# Patient Record
Sex: Female | Born: 1940 | Race: White | Hispanic: No | Smoking: Former smoker
Health system: Southern US, Community
[De-identification: ages and names within clinical notes are randomized; demographics above are authoritative.]

## PROBLEM LIST (undated history)

## (undated) DIAGNOSIS — R0600 Dyspnea, unspecified: Secondary | ICD-10-CM

## (undated) DIAGNOSIS — G4700 Insomnia, unspecified: Secondary | ICD-10-CM

## (undated) DIAGNOSIS — E119 Type 2 diabetes mellitus without complications: Secondary | ICD-10-CM

## (undated) DIAGNOSIS — M5106 Intervertebral disc disorders with myelopathy, lumbar region: Secondary | ICD-10-CM

## (undated) DIAGNOSIS — E7800 Pure hypercholesterolemia, unspecified: Secondary | ICD-10-CM

## (undated) DIAGNOSIS — T8859XA Other complications of anesthesia, initial encounter: Secondary | ICD-10-CM

## (undated) DIAGNOSIS — H532 Diplopia: Secondary | ICD-10-CM

## (undated) DIAGNOSIS — M545 Low back pain, unspecified: Secondary | ICD-10-CM

## (undated) DIAGNOSIS — I509 Heart failure, unspecified: Secondary | ICD-10-CM

## (undated) DIAGNOSIS — R05 Cough: Secondary | ICD-10-CM

## (undated) DIAGNOSIS — T4145XA Adverse effect of unspecified anesthetic, initial encounter: Secondary | ICD-10-CM

## (undated) DIAGNOSIS — F419 Anxiety disorder, unspecified: Secondary | ICD-10-CM

## (undated) DIAGNOSIS — H9190 Unspecified hearing loss, unspecified ear: Secondary | ICD-10-CM

## (undated) DIAGNOSIS — Z9289 Personal history of other medical treatment: Secondary | ICD-10-CM

## (undated) DIAGNOSIS — I10 Essential (primary) hypertension: Secondary | ICD-10-CM

## (undated) DIAGNOSIS — K625 Hemorrhage of anus and rectum: Secondary | ICD-10-CM

## (undated) DIAGNOSIS — R002 Palpitations: Secondary | ICD-10-CM

## (undated) DIAGNOSIS — R1310 Dysphagia, unspecified: Secondary | ICD-10-CM

## (undated) DIAGNOSIS — R61 Generalized hyperhidrosis: Secondary | ICD-10-CM

## (undated) DIAGNOSIS — Z9889 Other specified postprocedural states: Secondary | ICD-10-CM

## (undated) DIAGNOSIS — H409 Unspecified glaucoma: Secondary | ICD-10-CM

## (undated) DIAGNOSIS — G894 Chronic pain syndrome: Secondary | ICD-10-CM

## (undated) DIAGNOSIS — M4716 Other spondylosis with myelopathy, lumbar region: Secondary | ICD-10-CM

## (undated) DIAGNOSIS — Q638 Other specified congenital malformations of kidney: Secondary | ICD-10-CM

## (undated) DIAGNOSIS — H547 Unspecified visual loss: Secondary | ICD-10-CM

## (undated) DIAGNOSIS — Z201 Contact with and (suspected) exposure to tuberculosis: Secondary | ICD-10-CM

## (undated) DIAGNOSIS — H538 Other visual disturbances: Secondary | ICD-10-CM

## (undated) DIAGNOSIS — Z5189 Encounter for other specified aftercare: Secondary | ICD-10-CM

## (undated) DIAGNOSIS — R490 Dysphonia: Secondary | ICD-10-CM

## (undated) DIAGNOSIS — R531 Weakness: Secondary | ICD-10-CM

## (undated) DIAGNOSIS — I219 Acute myocardial infarction, unspecified: Secondary | ICD-10-CM

## (undated) DIAGNOSIS — K219 Gastro-esophageal reflux disease without esophagitis: Secondary | ICD-10-CM

## (undated) DIAGNOSIS — R079 Chest pain, unspecified: Secondary | ICD-10-CM

## (undated) DIAGNOSIS — H539 Unspecified visual disturbance: Secondary | ICD-10-CM

## (undated) DIAGNOSIS — M7989 Other specified soft tissue disorders: Secondary | ICD-10-CM

## (undated) DIAGNOSIS — F32A Depression, unspecified: Secondary | ICD-10-CM

## (undated) DIAGNOSIS — R51 Headache: Secondary | ICD-10-CM

## (undated) DIAGNOSIS — I38 Endocarditis, valve unspecified: Secondary | ICD-10-CM

## (undated) DIAGNOSIS — N289 Disorder of kidney and ureter, unspecified: Secondary | ICD-10-CM

## (undated) DIAGNOSIS — I499 Cardiac arrhythmia, unspecified: Secondary | ICD-10-CM

## (undated) DIAGNOSIS — H9319 Tinnitus, unspecified ear: Secondary | ICD-10-CM

## (undated) DIAGNOSIS — K5790 Diverticulosis of intestine, part unspecified, without perforation or abscess without bleeding: Secondary | ICD-10-CM

## (undated) DIAGNOSIS — R230 Cyanosis: Secondary | ICD-10-CM

## (undated) DIAGNOSIS — R112 Nausea with vomiting, unspecified: Secondary | ICD-10-CM

## (undated) DIAGNOSIS — I2510 Atherosclerotic heart disease of native coronary artery without angina pectoris: Secondary | ICD-10-CM

## (undated) DIAGNOSIS — R42 Dizziness and giddiness: Secondary | ICD-10-CM

## (undated) DIAGNOSIS — R55 Syncope and collapse: Secondary | ICD-10-CM

## (undated) DIAGNOSIS — J45909 Unspecified asthma, uncomplicated: Secondary | ICD-10-CM

## (undated) DIAGNOSIS — K649 Unspecified hemorrhoids: Secondary | ICD-10-CM

## (undated) DIAGNOSIS — R062 Wheezing: Secondary | ICD-10-CM

## (undated) DIAGNOSIS — J329 Chronic sinusitis, unspecified: Secondary | ICD-10-CM

## (undated) DIAGNOSIS — R0981 Nasal congestion: Secondary | ICD-10-CM

## (undated) DIAGNOSIS — K589 Irritable bowel syndrome without diarrhea: Secondary | ICD-10-CM

## (undated) DIAGNOSIS — R011 Cardiac murmur, unspecified: Secondary | ICD-10-CM

## (undated) HISTORY — DX: Disorder of kidney and ureter, unspecified: N28.9

## (undated) HISTORY — DX: Other specified soft tissue disorders: M79.89

## (undated) HISTORY — DX: Unspecified glaucoma: H40.9

## (undated) HISTORY — DX: Weakness: R53.1

## (undated) HISTORY — DX: Gastro-esophageal reflux disease without esophagitis: K21.9

## (undated) HISTORY — DX: Other spondylosis with myelopathy, lumbar region: M47.16

## (undated) HISTORY — DX: Unspecified hemorrhoids: K64.9

## (undated) HISTORY — DX: Nasal congestion: R09.81

## (undated) HISTORY — DX: Chronic pain syndrome: G89.4

## (undated) HISTORY — DX: Syncope and collapse: R55

## (undated) HISTORY — PX: BACK SURGERY: SHX140

## (undated) HISTORY — DX: Unspecified hearing loss, unspecified ear: H91.90

## (undated) HISTORY — DX: Intervertebral disc disorders with myelopathy, lumbar region: M51.06

## (undated) HISTORY — DX: Dysphonia: R49.0

## (undated) HISTORY — PX: EYE SURGERY: SHX253

## (undated) HISTORY — DX: Encounter for other specified aftercare: Z51.89

## (undated) HISTORY — DX: Atherosclerotic heart disease of native coronary artery without angina pectoris: I25.10

## (undated) HISTORY — DX: Low back pain: M54.5

## (undated) HISTORY — DX: Generalized hyperhidrosis: R61

## (undated) HISTORY — PX: ABDOMINAL HYSTERECTOMY: SHX81

## (undated) HISTORY — DX: Tinnitus, unspecified ear: H93.19

## (undated) HISTORY — DX: Dyspnea, unspecified: R06.00

## (undated) HISTORY — DX: Low back pain, unspecified: M54.50

## (undated) HISTORY — DX: Cough: R05

## (undated) HISTORY — DX: Chest pain, unspecified: R07.9

## (undated) HISTORY — DX: Hemorrhage of anus and rectum: K62.5

## (undated) HISTORY — DX: Headache: R51

## (undated) HISTORY — PX: LAMINOTOMY: SHX998

## (undated) HISTORY — DX: Diplopia: H53.2

## (undated) HISTORY — DX: Cardiac murmur, unspecified: R01.1

## (undated) HISTORY — DX: Cyanosis: R23.0

## (undated) HISTORY — PX: CERVICAL SPINE SURGERY: SHX589

## (undated) HISTORY — DX: Dysphagia, unspecified: R13.10

## (undated) HISTORY — DX: Dizziness and giddiness: R42

## (undated) HISTORY — DX: Palpitations: R00.2

## (undated) HISTORY — DX: Diverticulosis of intestine, part unspecified, without perforation or abscess without bleeding: K57.90

## (undated) HISTORY — DX: Heart failure, unspecified: I50.9

## (undated) HISTORY — DX: Other visual disturbances: H53.8

## (undated) HISTORY — DX: Unspecified asthma, uncomplicated: J45.909

## (undated) HISTORY — DX: Contact with and (suspected) exposure to tuberculosis: Z20.1

## (undated) HISTORY — DX: Cardiac arrhythmia, unspecified: I49.9

## (undated) HISTORY — DX: Acute myocardial infarction, unspecified: I21.9

## (undated) HISTORY — DX: Other specified congenital malformations of kidney: Q63.8

## (undated) HISTORY — DX: Irritable bowel syndrome without diarrhea: K58.9

## (undated) HISTORY — PX: FIXATION KYPHOPLASTY: SHX860

## (undated) HISTORY — DX: Endocarditis, valve unspecified: I38

## (undated) HISTORY — PX: SPINAL FUSION: SHX223

## (undated) HISTORY — PX: BONE MARROW ASPIRATION: SHX1252

## (undated) HISTORY — DX: Unspecified visual disturbance: H53.9

## (undated) HISTORY — DX: Unspecified visual loss: H54.7

## (undated) HISTORY — DX: Wheezing: R06.2

## (undated) HISTORY — DX: Type 2 diabetes mellitus without complications: E11.9

---
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
## 1997-05-18 ENCOUNTER — Other Ambulatory Visit: Admission: RE | Admit: 1997-05-18 | Discharge: 1997-05-18 | Payer: Self-pay | Admitting: Gynecology

## 1997-10-18 ENCOUNTER — Ambulatory Visit (HOSPITAL_COMMUNITY): Admission: RE | Admit: 1997-10-18 | Discharge: 1997-10-18 | Payer: Self-pay | Admitting: Gastroenterology

## 1998-03-16 ENCOUNTER — Ambulatory Visit (HOSPITAL_COMMUNITY): Admission: RE | Admit: 1998-03-16 | Discharge: 1998-03-16 | Payer: Self-pay

## 1998-05-31 ENCOUNTER — Encounter: Payer: Self-pay | Admitting: *Deleted

## 1998-06-02 ENCOUNTER — Ambulatory Visit (HOSPITAL_COMMUNITY): Admission: RE | Admit: 1998-06-02 | Discharge: 1998-06-02 | Payer: Self-pay | Admitting: *Deleted

## 1998-07-11 ENCOUNTER — Inpatient Hospital Stay (HOSPITAL_COMMUNITY): Admission: EM | Admit: 1998-07-11 | Discharge: 1998-07-12 | Payer: Self-pay | Admitting: Emergency Medicine

## 1998-11-02 ENCOUNTER — Other Ambulatory Visit: Admission: RE | Admit: 1998-11-02 | Discharge: 1998-11-02 | Payer: Self-pay | Admitting: Gynecology

## 1998-11-08 ENCOUNTER — Encounter: Payer: Self-pay | Admitting: *Deleted

## 1998-11-10 ENCOUNTER — Ambulatory Visit (HOSPITAL_COMMUNITY): Admission: RE | Admit: 1998-11-10 | Discharge: 1998-11-10 | Payer: Self-pay | Admitting: *Deleted

## 1999-01-16 ENCOUNTER — Encounter: Payer: Self-pay | Admitting: *Deleted

## 1999-01-16 ENCOUNTER — Encounter: Admission: RE | Admit: 1999-01-16 | Discharge: 1999-01-16 | Payer: Self-pay | Admitting: *Deleted

## 1999-01-26 ENCOUNTER — Ambulatory Visit (HOSPITAL_COMMUNITY): Admission: RE | Admit: 1999-01-26 | Discharge: 1999-01-26 | Payer: Self-pay | Admitting: *Deleted

## 1999-01-26 ENCOUNTER — Encounter (INDEPENDENT_AMBULATORY_CARE_PROVIDER_SITE_OTHER): Payer: Self-pay | Admitting: Specialist

## 1999-03-30 ENCOUNTER — Ambulatory Visit (HOSPITAL_COMMUNITY): Admission: RE | Admit: 1999-03-30 | Discharge: 1999-03-30 | Payer: Self-pay | Admitting: *Deleted

## 1999-07-02 ENCOUNTER — Ambulatory Visit (HOSPITAL_COMMUNITY): Admission: RE | Admit: 1999-07-02 | Discharge: 1999-07-02 | Payer: Self-pay | Admitting: *Deleted

## 1999-08-31 ENCOUNTER — Ambulatory Visit (HOSPITAL_COMMUNITY): Admission: RE | Admit: 1999-08-31 | Discharge: 1999-08-31 | Payer: Self-pay | Admitting: Gastroenterology

## 1999-08-31 ENCOUNTER — Encounter: Payer: Self-pay | Admitting: Gastroenterology

## 1999-12-14 ENCOUNTER — Other Ambulatory Visit: Admission: RE | Admit: 1999-12-14 | Discharge: 1999-12-14 | Payer: Self-pay | Admitting: Gynecology

## 1999-12-18 ENCOUNTER — Encounter: Payer: Self-pay | Admitting: Gynecology

## 1999-12-18 ENCOUNTER — Ambulatory Visit (HOSPITAL_COMMUNITY): Admission: RE | Admit: 1999-12-18 | Discharge: 1999-12-18 | Payer: Self-pay | Admitting: Gynecology

## 2000-11-10 ENCOUNTER — Ambulatory Visit (HOSPITAL_COMMUNITY): Admission: RE | Admit: 2000-11-10 | Discharge: 2000-11-10 | Payer: Self-pay | Admitting: Family Medicine

## 2000-11-10 ENCOUNTER — Encounter: Payer: Self-pay | Admitting: Family Medicine

## 2000-11-12 ENCOUNTER — Encounter: Payer: Self-pay | Admitting: Gynecology

## 2000-11-12 ENCOUNTER — Ambulatory Visit (HOSPITAL_COMMUNITY): Admission: RE | Admit: 2000-11-12 | Discharge: 2000-11-12 | Payer: Self-pay | Admitting: Gynecology

## 2001-01-14 ENCOUNTER — Other Ambulatory Visit: Admission: RE | Admit: 2001-01-14 | Discharge: 2001-01-14 | Payer: Self-pay | Admitting: Dermatology

## 2001-08-18 ENCOUNTER — Encounter (INDEPENDENT_AMBULATORY_CARE_PROVIDER_SITE_OTHER): Payer: Self-pay | Admitting: *Deleted

## 2001-08-18 ENCOUNTER — Ambulatory Visit (HOSPITAL_COMMUNITY): Admission: RE | Admit: 2001-08-18 | Discharge: 2001-08-18 | Payer: Self-pay | Admitting: Gastroenterology

## 2001-12-18 ENCOUNTER — Encounter: Payer: Self-pay | Admitting: Gynecology

## 2001-12-18 ENCOUNTER — Ambulatory Visit (HOSPITAL_COMMUNITY): Admission: RE | Admit: 2001-12-18 | Discharge: 2001-12-18 | Payer: Self-pay | Admitting: Gynecology

## 2002-04-02 ENCOUNTER — Other Ambulatory Visit: Admission: RE | Admit: 2002-04-02 | Discharge: 2002-04-02 | Payer: Self-pay | Admitting: Gynecology

## 2002-04-26 ENCOUNTER — Encounter: Payer: Self-pay | Admitting: Internal Medicine

## 2002-04-26 ENCOUNTER — Ambulatory Visit (HOSPITAL_COMMUNITY): Admission: RE | Admit: 2002-04-26 | Discharge: 2002-04-26 | Payer: Self-pay | Admitting: Internal Medicine

## 2003-01-12 ENCOUNTER — Ambulatory Visit (HOSPITAL_COMMUNITY): Admission: RE | Admit: 2003-01-12 | Discharge: 2003-01-12 | Payer: Self-pay | Admitting: Gynecology

## 2003-06-17 ENCOUNTER — Encounter: Admission: RE | Admit: 2003-06-17 | Discharge: 2003-06-17 | Payer: Self-pay | Admitting: Gastroenterology

## 2003-09-16 ENCOUNTER — Emergency Department (HOSPITAL_COMMUNITY): Admission: EM | Admit: 2003-09-16 | Discharge: 2003-09-16 | Payer: Self-pay

## 2004-09-19 ENCOUNTER — Other Ambulatory Visit: Admission: RE | Admit: 2004-09-19 | Discharge: 2004-09-19 | Payer: Self-pay | Admitting: Gynecology

## 2005-02-05 ENCOUNTER — Ambulatory Visit (HOSPITAL_COMMUNITY): Admission: RE | Admit: 2005-02-05 | Discharge: 2005-02-05 | Payer: Self-pay | Admitting: Gastroenterology

## 2005-07-30 ENCOUNTER — Ambulatory Visit (HOSPITAL_COMMUNITY): Admission: RE | Admit: 2005-07-30 | Discharge: 2005-07-30 | Payer: Self-pay | Admitting: Cardiovascular Disease

## 2005-10-15 ENCOUNTER — Encounter: Admission: RE | Admit: 2005-10-15 | Discharge: 2005-10-15 | Payer: Self-pay | Admitting: Orthopaedic Surgery

## 2005-11-11 ENCOUNTER — Encounter: Admission: RE | Admit: 2005-11-11 | Discharge: 2005-11-11 | Payer: Self-pay | Admitting: Orthopaedic Surgery

## 2005-12-19 ENCOUNTER — Other Ambulatory Visit: Admission: RE | Admit: 2005-12-19 | Discharge: 2005-12-19 | Payer: Self-pay | Admitting: Gynecology

## 2006-05-07 ENCOUNTER — Encounter: Admission: RE | Admit: 2006-05-07 | Discharge: 2006-05-07 | Payer: Self-pay | Admitting: Orthopaedic Surgery

## 2006-06-04 ENCOUNTER — Encounter: Admission: RE | Admit: 2006-06-04 | Discharge: 2006-06-30 | Payer: Self-pay | Admitting: Orthopaedic Surgery

## 2006-08-20 ENCOUNTER — Encounter: Admission: RE | Admit: 2006-08-20 | Discharge: 2006-08-20 | Payer: Self-pay | Admitting: Orthopaedic Surgery

## 2006-10-24 ENCOUNTER — Inpatient Hospital Stay (HOSPITAL_COMMUNITY): Admission: RE | Admit: 2006-10-24 | Discharge: 2006-10-27 | Payer: Self-pay | Admitting: Orthopaedic Surgery

## 2007-01-28 ENCOUNTER — Encounter: Admission: RE | Admit: 2007-01-28 | Discharge: 2007-02-25 | Payer: Self-pay | Admitting: Orthopaedic Surgery

## 2007-02-11 ENCOUNTER — Other Ambulatory Visit: Admission: RE | Admit: 2007-02-11 | Discharge: 2007-02-11 | Payer: Self-pay | Admitting: Gynecology

## 2007-02-12 ENCOUNTER — Encounter: Admission: RE | Admit: 2007-02-12 | Discharge: 2007-02-12 | Payer: Self-pay | Admitting: Gynecology

## 2007-02-26 ENCOUNTER — Encounter: Admission: RE | Admit: 2007-02-26 | Discharge: 2007-02-26 | Payer: Self-pay | Admitting: Orthopaedic Surgery

## 2007-04-04 ENCOUNTER — Encounter: Admission: RE | Admit: 2007-04-04 | Discharge: 2007-04-04 | Payer: Self-pay | Admitting: Orthopaedic Surgery

## 2007-11-20 ENCOUNTER — Emergency Department (HOSPITAL_COMMUNITY): Admission: EM | Admit: 2007-11-20 | Discharge: 2007-11-20 | Payer: Self-pay | Admitting: Emergency Medicine

## 2008-01-12 DIAGNOSIS — Q638 Other specified congenital malformations of kidney: Secondary | ICD-10-CM

## 2008-01-12 HISTORY — DX: Other specified congenital malformations of kidney: Q63.8

## 2008-08-22 ENCOUNTER — Ambulatory Visit (HOSPITAL_COMMUNITY): Admission: RE | Admit: 2008-08-22 | Discharge: 2008-08-22 | Payer: Self-pay | Admitting: Gynecology

## 2008-09-30 ENCOUNTER — Encounter: Admission: RE | Admit: 2008-09-30 | Discharge: 2008-09-30 | Payer: Self-pay | Admitting: Orthopaedic Surgery

## 2008-12-31 ENCOUNTER — Encounter: Admission: RE | Admit: 2008-12-31 | Discharge: 2008-12-31 | Payer: Self-pay | Admitting: Orthopaedic Surgery

## 2009-04-21 ENCOUNTER — Encounter: Admission: RE | Admit: 2009-04-21 | Discharge: 2009-04-21 | Payer: Self-pay | Admitting: Gastroenterology

## 2009-05-04 ENCOUNTER — Emergency Department (HOSPITAL_COMMUNITY): Admission: EM | Admit: 2009-05-04 | Discharge: 2009-05-04 | Payer: Self-pay | Admitting: Emergency Medicine

## 2009-10-03 ENCOUNTER — Encounter: Admission: RE | Admit: 2009-10-03 | Discharge: 2009-10-03 | Payer: Self-pay | Admitting: Orthopaedic Surgery

## 2009-10-31 ENCOUNTER — Emergency Department (HOSPITAL_COMMUNITY): Admission: EM | Admit: 2009-10-31 | Discharge: 2009-10-31 | Payer: Self-pay | Admitting: Emergency Medicine

## 2010-03-18 ENCOUNTER — Encounter: Payer: Self-pay | Admitting: Orthopaedic Surgery

## 2010-05-20 LAB — CBC
HCT: 35.6 % — ABNORMAL LOW (ref 36.0–46.0)
MCV: 93.5 fL (ref 78.0–100.0)
Platelets: 214 10*3/uL (ref 150–400)
RBC: 3.8 MIL/uL — ABNORMAL LOW (ref 3.87–5.11)
WBC: 7.7 10*3/uL (ref 4.0–10.5)

## 2010-05-20 LAB — COMPREHENSIVE METABOLIC PANEL
AST: 23 U/L (ref 0–37)
Albumin: 3.3 g/dL — ABNORMAL LOW (ref 3.5–5.2)
Alkaline Phosphatase: 65 U/L (ref 39–117)
BUN: 6 mg/dL (ref 6–23)
CO2: 25 mEq/L (ref 19–32)
Chloride: 110 mEq/L (ref 96–112)
GFR calc non Af Amer: >60 mL/min (ref >60)
Potassium: 3.8 mEq/L (ref 3.5–5.1)
Total Bilirubin: 0.7 mg/dL (ref 0.3–1.2)

## 2010-05-20 LAB — URINALYSIS, ROUTINE W REFLEX MICROSCOPIC
Glucose, UA: NEGATIVE mg/dL
Hgb urine dipstick: NEGATIVE
Ketones, ur: NEGATIVE mg/dL
Protein, ur: NEGATIVE mg/dL

## 2010-05-20 LAB — DIFFERENTIAL
Basophils Absolute: 0.1 10*3/uL (ref 0.0–0.1)
Basophils Relative: 1 % (ref 0–1)
Eosinophils Relative: 5 % (ref 0–5)
Monocytes Absolute: 0.9 10*3/uL (ref 0.1–1.0)
Neutro Abs: 4.5 10*3/uL (ref 1.7–7.7)

## 2010-05-20 LAB — PROTIME-INR: Prothrombin Time: 13.7 seconds (ref 11.6–15.2)

## 2010-07-10 NOTE — Op Note (Signed)
NAME:  Kaitlyn Serrano, Kaitlyn Serrano                  ACCOUNT NO.:  i   MEDICAL RECORD NO.:  [id number]          PATIENT TYPE:  INP   LOCATION:  5038                         FACILITY:  [facility]   PHYSICIAN:  Sharolyn Douglas, M.D.        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   PROCEDURE:  1. L4-L5 and L5-S1 laminectomy with decompression of the thecal sac      and nerve roots bilaterally.  2. Posterior spinal fusion L4 through S1  3. Segmental pedicle screw instrumentation L4-S1 using the Abbott      spine system.  4. Transforaminal lumbar interbody fusion at L4-L5 and L5-S1 with      placement of three PEEK cages, two at L4-L5 and one at L5-S1.  5. Local autogenous bone graft supplemented with 15 mL of Grafton      allograft and OP1 BMP.   SURGEON:  Sharolyn Douglas, M.D.   ASSISTANTJill Side Mahar, P.A.-C.   ANESTHESIA:  General endotracheal.   ESTIMATED BLOOD LOSS:  300 mL.   COMPLICATIONS:  None.   COUNTS:  Needle and sponge count correct.   INDICATIONS:  The patient is a pleasant 70 year old female with chronic  progressive back and bilateral lower extremity pain.  Her imaging  studies show degenerative disc disease at L4-L5 and L5-S1.  She has had  a two level positive concordant diskogram at both of these levels with  normal controls above.  She has failed all attempts at other  conservative treatment modalities and now elects to undergo two level  lumbar decompression and fusion from L4 to S1.  The risk, benefits, and  alternatives were reviewed.  The patient elected to proceed.   DESCRIPTION OF PROCEDURE:  After informed consent, she was taken to the  operating room.  She underwent general endotracheal anesthesia without  difficulty and given prophylactic IV antibiotics.  Neural monitoring was  established in the form of lower extremity EMGs and SSEPs. She was  carefully turned prone onto the Wilson frame.  All bony prominences  padded.  Her face and eyes were protected at all times.  Her back was  prepped and draped in the usual sterile fashion.  A midline incision was  made from L4 down to S1.  Midline dissection was carried through the  deep fascia.  Subperiosteal exposure carried out to the tips of the  transverse process of L4, L5, and also the sacral ala bilaterally.  Deep  retractors were placed.  We took an intraoperative x-ray to confirm the  levels.  We then turned our attention to performing a wide laminectomy  by removing the spinous processes and lamina of L4 and L5. The  ligamentum flavum was removed piecemeal.  The thecal sac was  decompressed.  We carried out the laminectomy into the lateral recess  decompressing the L5 and S1 nerve roots.  Once we were satisfied  with  the decompression, we turned our attention to placing pedicle screws.   Using anatomic probing technique, pedicle screws were placed at L4, L5,  and S1 bilaterally.  We utilized 6.5 x 50 mm screws at L4 and L5, 7.5 x  40 mm screws in the sacrum bilaterally.  The screw purchase was good.  Each screw hole was initiated with the awl.  The pedicles were  cannulated. The pedicles were then palpated with a ball tip feeler and  there were no breeches.  After placing pedicle screws, they were  stimulated using triggered EMGs and there were no deleterious changes.   We then turned our attention to completing the posterior spinal  arthrodesis.  The transverse processes of L4 and L5 were decorticated  along with the sacral ala.  We then packed local autogenous bone graft,  which had been obtained from the laminectomy, into the lateral gutters.  This was supplemented with 15 mL of Grafton allograft.   At this point, we turned our attention to performing transforaminal  lumbar interbody fusions at L4-L5 and  L5-S1.  This was done in order to  remove the painful L4-L5 and L5-S1 disc and also to improve the fusion  rate.  On the left side, the remaining set joints at L4-5 and L5-S1 were  osteotomized.  Free running EMGs were monitored. The exiting  transversing nerve roots were identified and protected all times.  Starting at L5-S1, a transforaminal window was created, the disc space  was entered, and a radical discectomy was completed. The cartilaginous  endplates were scraped clean.  The disc space was dilated up to 10 mm.  We then packed the disc space with local bone graft along with OP1 BMP.  We then inserted a 10 mm PEEK cage which had been packed with the OP1  BMP into the interspace, tamped it anteriorly and across the midline.  We then turned our attention to performing the same procedure at L4-L5.  Again, the facet was osteotomized.  The exiting transversing nerve roots  were identified and protected. A transforaminal window was created, the  disc space was entered, and a radical discectomy was completed.  At this  level, we distracted the disc space up to 14 mm.  We packed the disc  space with local autogenous bone graft.  We then inserted a 14-mm PEEK  cage which had been packed with the BMP into the interspace, tamped it  anteriorly, and flipped it longitudinally.  We then placed a second PEEK  cage at this level, again, flipping it longitudinally.  Hemostasis was  achieved.  There were no deleterious changes in neural monitoring  including the free running EMGs and SSEPs throughout the procedure.  We  placed 70 mm titanium rods into the polyaxial screw heads.  Compression  was applied across each segment before shearing off the locking caps.  A  cross connector was placed.   A Hemovac drain was left in place.  The deep fascia was closed with a  running #1 Vicryl suture, the subcutaneous layer closed with 0 Vicryl  and 2-0 Vicryl, followed by a running 3-0 subcuticular Vicryl  suture on  the skin edges.  Dermabond was applied.  A sterile dressing was placed.  The patient was turned supine, extubated without difficulty, and  transferred to recovery in stable condition.   It should be noted my assistant, [employer], P.A.-C., was present  throughout the procedure including positioning and the exposure.  She  assisted  with the exposure using the Cobb elevators and suction.  She  assisted me with loupes and headlight magnification to do the  laminectomy, the decompression, the transforaminal lumbar interbody  fusion.  She also assisted with the arthrodesis and the instrumentation.  She helped me with wound closure.      Sharolyn Douglas, M.D.  Electronically Signed     MC/MEDQ  D:  10/24/2006  T:  10/25/2006  Job:  [id number]

## 2010-07-13 NOTE — Op Note (Signed)
NAMEKIMANI, BEDOYA              ACCOUNT NO.:  [id number]   MEDICAL RECORD NO.:  [id number]          PATIENT TYPE:  AMB   LOCATION:  ENDO                         FACILITY:  [facility]   PHYSICIAN:  Petra Kuba, M.D.    DATE OF BIRTH:  04/18/1940   DATE OF PROCEDURE:  02/05/2005  DATE OF DISCHARGE:                                 OPERATIVE REPORT   PROCEDURE:  EGD with Savary dilatation.   INDICATIONS:  Increased dysphagia. Consent was signed prior to any premeds  given after the risks, benefits, methods, and options were thoroughly  discussed multiple times in the past.   MEDICINES USED ADDITIONALLY:  30 of Demerol, 3 of Versed.   DESCRIPTION OF PROCEDURE:  The video endoscope was inserted by direct  vision. The esophagus was tortuous but no abnormalities were seen. Possibly  she had a tiny hiatal hernia. The scope passed into the stomach and advanced  to a normal antrum, normal pylorus into a normal duodenal bulb around the C-  loop to a normal second portion of the duodenum. A normal-appearing ampulla  was seen. The scope was then slowly withdrawn back to the bulb. A good look  there ruled out abnormalities in that location. The scope was withdrawn back  to the stomach and retroflexed. The  angularis, cardia, fundus, lesser and  greater curve were normal except for possibly some mild atrophic gastritis  and high in the cardia the tiny hiatal hernia probably being confirmed. The  scope was straightened. Straight visualization of stomach did not feel any  additional findings. The scope was then slowly withdrawn back to about 18  cm. Again a good look at the esophagus was normal. The scope was then  advanced to the antrum. The Savary wire was advanced and on one-to-one  removal and advancement the scope was removed making sure to keep the wire  in the proper position. Once the scope was removed, the wire was confirmed  at four black mark which corresponds to 80 cm usually confirming  proper  location. No fluoro was used. We went ahead and proceeded with the Savary 15  and then 16 mm dilators without resistance or heme. After the 15 was  removed, again the wire was at the 4 black mark confirming probable proper  location. Once the 16 was inserted, the wire was withdrawn back into the  dilator both were removed in tandem. The procedure was terminated at that  junction. There was no obvious immediate complication. The patient tolerated  the procedure well.   ENDOSCOPIC DIAGNOSES:  1.  Questionable tiny hiatal hernia.  2.  Minimal atrophic gastritis.  3.  Otherwise essentially normal EGD therapy. Savary dilatation to 16 mm      without fluoro, without heme or resistance.   PLAN:  Continue Zegerid since it seems to be helping. See how the dilation  works, follow-up p.r.n. or in 2-3 months.           ______________________________  Petra Kuba, M.D.     MEM/MEDQ  D:  02/05/2005  T:  02/06/2005  Job:  [id number]  cc:   Ernestina Penna, M.D.  Fax: [phone]

## 2010-07-13 NOTE — Op Note (Signed)
Kaitlyn Serrano, Kaitlyn Serrano              ACCOUNT NO.:  [id number]   MEDICAL RECORD NO.:  [id number]          PATIENT TYPE:  AMB   LOCATION:  ENDO                         FACILITY:  [facility]   PHYSICIAN:  Petra Kuba, M.D.    DATE OF BIRTH:  02/24/1941   DATE OF PROCEDURE:  02/05/2005  DATE OF DISCHARGE:                                 OPERATIVE REPORT   PROCEDURE:  Colonoscopy.   INDICATIONS:  History of colon polyps, some increasing constipation, due for  repeat screening. Consent was signed after risks, benefits, methods, and  options were thoroughly discussed in the office on multiple occasions.   MEDICINES USED:  Demerol 70, Versed 7.   DESCRIPTION OF PROCEDURE:  Rectal inspection is pertinent for external  hemorrhoids, small. Digital exam was negative. The video pediatric  adjustable colonoscope was inserted and despite some significant left-sided  diverticula, easily advanced around the colon to the cecum. This did not  require any abdominal pressure or any position changes. No polypoid lesions  were seen on insertion. The cecum was identified by the appendiceal orifice  and the ileocecal valve. In fact the scope was inserted a short ways into  the terminal ileum which was normal. Photo documentation was obtained. The  scope was slowly withdrawn. The prep was adequate. There was some liquid  stool that required washing and suctioning but on slow withdrawal through  the colon other than the significant left-sided diverticula, no  abnormalities were seen. Once back in the rectum, anorectal pull-through and  retroflexion confirmed some small hemorrhoids. The scope was straightened  and readvanced a short ways up the left side of the colon, air was  suctioned, scope removed. The patient tolerated the procedure well. There  was no obvious immediate complication.   ENDOSCOPIC DIAGNOSES:  1.  Internal and external hemorrhoids.  2.  Left significant diverticula.  3.  Otherwise within  normal limits to the terminal ileum.   PLAN:  Repeat screening in 5 years. Continue workup with an EGD and  dilatation for dysphagia.           ______________________________  Petra Kuba, M.D.     MEM/MEDQ  D:  02/05/2005  T:  02/06/2005  Job:  [id number]   cc:   Ernestina Penna, M.D.  Fax: [phone]

## 2010-07-13 NOTE — Procedures (Signed)
[facility]  Patient:    Kaitlyn Serrano, FADDIS Visit Number: [id number] MRN: [id number]          Service Type: END Location: ENDO Attending Physician:  Nelda Marseille Dictated by:   Petra Kuba, M.D. Proc. Date: 08/18/01 Admit Date:  08/18/2001   CC:         Monica Becton, M.D.   Procedure Report  PROCEDURE:  Colonoscopy with polypectomy.  INDICATIONS FOR PROCEDURE:  Guaiac positivity, family history of colon polyps, personal history of colon polyps.  Consent was signed after risks, benefits, methods, and options were thoroughly discussed in the office.  MEDICINES USED:  Demerol 60, Versed 6.  PROCEDURE:  Rectal inspection was pertinent for external hemorrhoids, small. Digital exam was negative. The video pediatric adjustable colonoscope was inserted and easily advanced around the colon to the cecum. This did require some abdominal pressure but no position changes. On insertion, left sided diverticula were seen. The cecum was identified by the appendiceal orifice and the ileocecal valve, no other abnormalities were seen on insertion, no blood was seen. The scope was inserted a short ways into the terminal ileum which was normal. Photo documentation was obtained, the scope was slowly withdrawn. The prep was adequate. There was some liquid stool that required washing and suctioning but on slow withdrawal through the colon other than the left sided diverticula, two tiny polyps were seen, one in the transverse hot biopsied x2 and one in the sigmoid hot biopsied x1. No other lesions were seen as we withdrew back to the rectum. Once back in the rectum, the scope was retroflexed pertinent for some internal hemorrhoids. The scope was straightened and advanced a short ways in the left side of the colon, air was suctioned, the scope removed. The patient tolerated the procedure well. There was no obvious or immediate complication.  ENDOSCOPIC  DIAGNOSIS: 1. Internal and external hemorrhoids. 2. Left sided diverticula. 3. Two tiny sigmoid and transverse polyps hot biopsied. 4. Otherwise within normal limits to the terminal ileum.  PLAN:  Await pathology but would repeat screening in five years. Happy to see back p.r.n. or in two months to recheck guaiac symptoms, possibly a CBC and decide any other workup and plans. Otherwise return care to Dr. Christell Constant for the customary health care maintenance. Dictated by:   Petra Kuba, M.D. Attending Physician:  Nelda Marseille DD:  08/18/01 TD:  08/19/01 Job: [id number]

## 2010-07-13 NOTE — Discharge Summary (Signed)
Kaitlyn Serrano, HANDLEY NO.:  [id number]   MEDICAL RECORD NO.:  [id number]          PATIENT TYPE:  INP   LOCATION:  5038                         FACILITY:  [facility]   PHYSICIAN:  Sharolyn Douglas, M.D.        DATE OF BIRTH:  [birth year]   DATE OF ADMISSION:  10/24/2006  DATE OF DISCHARGE:  10/27/2006                               DISCHARGE SUMMARY   ADMISSION DIAGNOSES:  1. Degenerative disease lumbar spine with pain.  2. Hypertension.  3. Glaucoma.  4. Gastroesophageal reflux disease.   DISCHARGE DIAGNOSES:  1. Status post L4-5 fusion, doing well.  2. Postoperative blood loss anemia.  3. Degenerative disease lumbar spine with pain.  4. Hypertension.  5. Glaucoma.  6. Gastroesophageal reflux disease.   PROCEDURE:  On October 24, 2006, the patient was taken for a L4-S1  posterior spinal fusion with pedicle screws.  Surgeon was Sharolyn Douglas,  assistant [employer], PAC.  Anesthesia was general.  Consults none.   LABORATORY DATA:  CBC with diff from preop was within normal limits with  the exception of __________ of 0.9 and eosinophils of 8.  CBC  postoperatively x3 days:  Hemoglobin reached a low of 8.5 and hematocrit  25.5 on October 27, 2006.  PT/INR and PTT preop normal.  Complete  metabolic panel preop was normal with the exception of glucose of 109.  Postoperatively, basic metabolic panel was normal with the exception of  slight pelvic glucose of 161 and 126 on postop day #1 and #2.  UA was  negative.  Urine culture shows no growth.  X-rays were used  intraoperatively for localization, also done on September 1 prior to  discharge showed L4-S1 posterior spinal fusion with no complicating  features.  EKG from October 24, 2006, showed normal sinus rhythm, minimal  voltage criteria for LVH.  Cannot rule out anterior infarct.  Read by  Dr. Algie Coffer.   BRIEF HISTORY:  The patient is a 70 year old female with a long history  problems with her back.  Unfortunately, she  failed conservative  treatment.  Pain continued getting worse.  Risks and benefits of surgery  were discussed with the patient at length by Dr. Noel Gerold as well as  myself.  She indicated understanding and opted to proceed with surgery.   HOSPITAL COURSE:  On October 24, 2006, the patient was admitted to the  hospital for the above procedure.  She tolerated procedure well without  any intraoperative complications.  She was transferred to recovery room  in stable condition.  Postoperatively routine orthopedic spine protocol  was followed.   Physical therapy, occupational therapy worked with the patient on a  daily basis.  They worked on brace use, progressive ambulation program  and back precautions.  She progressed along well with them and got to  the point she was independent and safe with all the above prior to  discharge.   The patient did not develop any medical complications through her  postoperative course, and she was ready for discharge by October 27, 2006.   DISCHARGE/PLAN:  Patient is a 70 year old female  status post L4-S1  posterior spinal fusion doing very well.   DISCHARGE INSTRUCTIONS:  1. Activities:  Daily ambulation program.  Brace on when she is up.      Back precautions at all times.  No lifting greater than 5 pounds.      The patient may shower.  Follow-up 2 weeks postop with Dr. Noel Gerold.  2. DIET:  Regular home diet as tolerated.   MEDICATIONS ON DISCHARGE:  1. Vicodin as needed for pain.  2. Robaxin as needed for muscle spasm.  3. Multivitamin daily.  4. Calcium 1200 daily.  5. Colace twice daily as needed.  6. Laxative as needed.  7. No NSAIDs.  8. Resume home medications.   CONDITION ON DISCHARGE:  Stable, improved.   DISPOSITION:  The patient is being discharged to home with her family's  help as well as Home Health physical therapy and occupational therapy.      Verlin Fester, P.A.      Sharolyn Douglas, M.D.  Electronically Signed    CM/MEDQ   D:  11/20/2006  T:  11/20/2006  Job:  [id number]   cc:   Sharolyn Douglas, M.D.

## 2010-12-05 ENCOUNTER — Ambulatory Visit: Payer: Medicare Other | Attending: Orthopaedic Surgery | Admitting: Physical Therapy

## 2010-12-05 DIAGNOSIS — R293 Abnormal posture: Secondary | ICD-10-CM | POA: Insufficient documentation

## 2010-12-05 DIAGNOSIS — M2560 Stiffness of unspecified joint, not elsewhere classified: Secondary | ICD-10-CM | POA: Insufficient documentation

## 2010-12-05 DIAGNOSIS — IMO0001 Reserved for inherently not codable concepts without codable children: Secondary | ICD-10-CM | POA: Insufficient documentation

## 2010-12-05 DIAGNOSIS — M542 Cervicalgia: Secondary | ICD-10-CM | POA: Insufficient documentation

## 2010-12-05 DIAGNOSIS — R5381 Other malaise: Secondary | ICD-10-CM | POA: Insufficient documentation

## 2010-12-06 ENCOUNTER — Ambulatory Visit: Payer: Medicare Other | Admitting: Physical Therapy

## 2010-12-07 LAB — COMPREHENSIVE METABOLIC PANEL
AST: 22
Albumin: 3.9
Chloride: 102
Creatinine, Ser: 0.69
GFR calc Af Amer: >60
Potassium: 3.7
Sodium: 138
Total Bilirubin: 0.6

## 2010-12-07 LAB — BASIC METABOLIC PANEL
BUN: 5 — ABNORMAL LOW
CO2: 31
CO2: 28
Calcium: 8.3 — ABNORMAL LOW
Chloride: 105
Chloride: 104
Chloride: 106
Creatinine, Ser: 0.48
Creatinine, Ser: 0.53
GFR calc Af Amer: >60
GFR calc Af Amer: >60
GFR calc non Af Amer: >60
Glucose, Bld: 96
Potassium: 3.5
Sodium: 138

## 2010-12-07 LAB — URINE CULTURE
Colony Count: NO GROWTH
Culture: NO GROWTH

## 2010-12-07 LAB — CBC
Hemoglobin: 8.8 — ABNORMAL LOW
MCHC: 34.2
MCHC: 34.6
MCV: 91.2
MCV: 90.3
MCV: 90.4
Platelets: 172
Platelets: 215
Platelets: 268
RBC: 2.81 — ABNORMAL LOW
RDW: 13.4
RDW: 13.3
WBC: 13.8 — ABNORMAL HIGH
WBC: 17 — ABNORMAL HIGH
WBC: 6.4

## 2010-12-07 LAB — DIFFERENTIAL
Basophils Absolute: 0
Eosinophils Relative: 8 — ABNORMAL HIGH
Lymphocytes Relative: 20
Lymphs Abs: 1.3
Monocytes Absolute: 0.9 — ABNORMAL HIGH

## 2010-12-07 LAB — URINALYSIS, ROUTINE W REFLEX MICROSCOPIC
Glucose, UA: NEGATIVE
pH: 7.5

## 2010-12-10 ENCOUNTER — Ambulatory Visit: Payer: Medicare Other | Admitting: Physical Therapy

## 2010-12-12 ENCOUNTER — Ambulatory Visit: Payer: Medicare Other | Admitting: Physical Therapy

## 2010-12-17 ENCOUNTER — Encounter: Payer: Medicare Other | Admitting: Physical Therapy

## 2010-12-19 ENCOUNTER — Encounter: Payer: Medicare Other | Admitting: Physical Therapy

## 2011-02-20 ENCOUNTER — Emergency Department (HOSPITAL_COMMUNITY)
Admission: EM | Admit: 2011-02-20 | Discharge: 2011-02-20 | Disposition: A | Discharge status: Home / Self Care | Payer: Medicare Other | Attending: Emergency Medicine | Admitting: Emergency Medicine

## 2011-02-20 ENCOUNTER — Encounter: Payer: Self-pay | Admitting: Emergency Medicine

## 2011-02-20 DIAGNOSIS — R059 Cough, unspecified: Secondary | ICD-10-CM | POA: Insufficient documentation

## 2011-02-20 DIAGNOSIS — R509 Fever, unspecified: Secondary | ICD-10-CM | POA: Insufficient documentation

## 2011-02-20 DIAGNOSIS — I10 Essential (primary) hypertension: Secondary | ICD-10-CM | POA: Insufficient documentation

## 2011-02-20 DIAGNOSIS — R51 Headache: Secondary | ICD-10-CM | POA: Insufficient documentation

## 2011-02-20 DIAGNOSIS — R070 Pain in throat: Secondary | ICD-10-CM | POA: Insufficient documentation

## 2011-02-20 DIAGNOSIS — E789 Disorder of lipoprotein metabolism, unspecified: Secondary | ICD-10-CM | POA: Insufficient documentation

## 2011-02-20 DIAGNOSIS — B9789 Other viral agents as the cause of diseases classified elsewhere: Secondary | ICD-10-CM | POA: Insufficient documentation

## 2011-02-20 DIAGNOSIS — B349 Viral infection, unspecified: Secondary | ICD-10-CM

## 2011-02-20 DIAGNOSIS — R599 Enlarged lymph nodes, unspecified: Secondary | ICD-10-CM | POA: Insufficient documentation

## 2011-02-20 DIAGNOSIS — R05 Cough: Secondary | ICD-10-CM | POA: Insufficient documentation

## 2011-02-20 DIAGNOSIS — Z79899 Other long term (current) drug therapy: Secondary | ICD-10-CM | POA: Insufficient documentation

## 2011-02-20 HISTORY — DX: Pure hypercholesterolemia, unspecified: E78.00

## 2011-02-20 HISTORY — DX: Insomnia, unspecified: G47.00

## 2011-02-20 HISTORY — DX: Essential (primary) hypertension: I10

## 2011-02-20 HISTORY — DX: Chronic sinusitis, unspecified: J32.9

## 2011-02-20 LAB — RAPID STREP SCREEN (MED CTR MEBANE ONLY): Streptococcus, Group A Screen (Direct): NEGATIVE

## 2011-02-20 MED ORDER — OXYCODONE-ACETAMINOPHEN 5-325 MG PO TABS
1.0000 | ORAL_TABLET | Freq: Once | ORAL | Status: AC
  Administered 2011-02-20: 1 via ORAL
  Filled 2011-02-20: qty 1

## 2011-02-20 NOTE — ED Notes (Signed)
Patient c/o sore throat, cough, chest soreness, headache, and fevers that started last night. Patient also reports nausea but denies any vomiting.

## 2011-02-20 NOTE — ED Provider Notes (Signed)
History    This chart was scribed for Benny Lennert, MD, MD by Smitty Pluck. The patient was seen in room APA17 and the patient's care was started at 8:23PM.   CSN: [id number]  Arrival date & time 02/20/11  1804   First MD Initiated Contact with Patient 02/20/11 2016      Chief Complaint  Patient presents with  . Headache  . Fever  . Sore Throat  . Cough    chest soreness    (Consider location/radiation/quality/duration/timing/severity/associated sxs/prior treatment) Patient is a 70 y.o. female presenting with headaches, fever, pharyngitis, and cough. The history is provided by the patient.  Headache  Associated symptoms include a fever.  Fever Primary symptoms of the febrile illness include fever, headaches and cough.  Sore Throat Associated symptoms include headaches.  Cough Associated symptoms include headaches.   Kaitlyn Serrano is a 70 y.o. female who presents to the Emergency Department complaining of moderate headache and sore throat onset 1 day ago. Pt reports getting flu vaccine this year. She reports the symptoms being constant since onset. Pt reports having cough and fever. Pt has taken aspirin for pain without relief. Pt denies abdominal pain. Pt reports having sick contact with relative (dad).   Past Medical History  Diagnosis Date  . Sinus infection   . Hypertension   . High cholesterol   . Insomnia     Past Surgical History  Procedure Date  . Cervical spine surgery   . Back surgery     History reviewed. No pertinent family history.  History  Substance Use Topics  . Smoking status: Former Smoker -- 4.0 packs/day for 3 years    Types: Cigarettes    Quit date: 02/20/1964  . Smokeless tobacco: Never Used  . Alcohol Use: No    OB History    Grav Para Term Preterm Abortions TAB SAB Ect Mult Living   4 3 3  1   1  3       Review of Systems  Constitutional: Positive for fever.  Respiratory: Positive for cough.   Neurological: Positive for  headaches.  All other systems reviewed and are negative.   10 Systems reviewed and are negative for acute change except as noted in the HPI.  Allergies  Bee venom and Penicillins  Home Medications   Current Outpatient Rx  Name Route Sig Dispense Refill  . ALPRAZOLAM 0.5 MG PO TABS Oral Take 0.25-0.5 mg by mouth at bedtime as needed. For sleep     . DILTIAZEM HCL ER COATED BEADS 240 MG PO CP24 Oral Take 240 mg by mouth daily.      Marland Kitchen EZETIMIBE 10 MG PO TABS Oral Take 10 mg by mouth at bedtime.      . FUROSEMIDE 40 MG PO TABS Oral Take 40 mg by mouth daily.      Marland Kitchen SIMVASTATIN 20 MG PO TABS Oral Take 20 mg by mouth at bedtime.      . TRAZODONE HCL 50 MG PO TABS Oral Take 50 mg by mouth at bedtime.        BP 138/75  Pulse 68  Temp(Src) 97.6 F (36.4 C) (Oral)  Resp 18  Ht 5\' 3"  (1.6 m)  Wt 159 lb (72.122 kg)  BMI 28.17 kg/m2  SpO2 100%  Physical Exam  Nursing note and vitals reviewed. Constitutional: She is oriented to person, place, and time. She appears well-developed.  HENT:  Head: Normocephalic and atraumatic.       pharynx  inflamed   Right node swelling in neck  Eyes: Conjunctivae and EOM are normal. No scleral icterus.  Neck: Neck supple. No thyromegaly present.  Cardiovascular: Normal rate and regular rhythm.  Exam reveals no gallop and no friction rub.   No murmur heard. Pulmonary/Chest: No stridor. She has no wheezes. She has no rales. She exhibits no tenderness.  Abdominal: She exhibits no distension. There is no tenderness. There is no rebound.  Musculoskeletal: Normal range of motion. She exhibits no edema.  Lymphadenopathy:    She has no cervical adenopathy.  Neurological: She is oriented to person, place, and time. Coordination normal.  Skin: No rash noted. No erythema.  Psychiatric: She has a normal mood and affect. Her behavior is normal.    ED Course  Procedures (including critical care time)  DIAGNOSTIC STUDIES: Oxygen Saturation is 100% on room  air, normal by my interpretation.    COORDINATION OF CARE:  10:31PM Recheck EDP discussed lab results and treatment course with pt. Pt is ready for discharge.    Labs Reviewed  RAPID STREP SCREEN   No results found.   1. Viral syndrome       MDM  Viral pharyngitis      The chart was scribed for me under my direct supervision.  I personally performed the history, physical, and medical decision making and all procedures in the evaluation of this patient.Benny Lennert, MD 02/20/11 [phone]

## 2011-04-02 DIAGNOSIS — K5732 Diverticulitis of large intestine without perforation or abscess without bleeding: Secondary | ICD-10-CM | POA: Diagnosis not present

## 2011-04-02 DIAGNOSIS — K649 Unspecified hemorrhoids: Secondary | ICD-10-CM | POA: Diagnosis not present

## 2011-04-02 DIAGNOSIS — R1032 Left lower quadrant pain: Secondary | ICD-10-CM | POA: Diagnosis not present

## 2011-04-08 ENCOUNTER — Other Ambulatory Visit: Payer: Self-pay | Admitting: Gastroenterology

## 2011-04-08 DIAGNOSIS — K219 Gastro-esophageal reflux disease without esophagitis: Secondary | ICD-10-CM | POA: Diagnosis not present

## 2011-04-08 DIAGNOSIS — K921 Melena: Secondary | ICD-10-CM | POA: Diagnosis not present

## 2011-04-08 DIAGNOSIS — K589 Irritable bowel syndrome without diarrhea: Secondary | ICD-10-CM | POA: Diagnosis not present

## 2011-04-08 DIAGNOSIS — K5730 Diverticulosis of large intestine without perforation or abscess without bleeding: Secondary | ICD-10-CM | POA: Diagnosis not present

## 2011-04-08 DIAGNOSIS — R109 Unspecified abdominal pain: Secondary | ICD-10-CM | POA: Diagnosis not present

## 2011-04-10 ENCOUNTER — Ambulatory Visit
Admission: RE | Admit: 2011-04-10 | Discharge: 2011-04-10 | Disposition: A | Discharge status: Home / Self Care | Payer: Medicare Other | Source: Ambulatory Visit | Attending: Gastroenterology | Admitting: Gastroenterology

## 2011-04-10 DIAGNOSIS — Z09 Encounter for follow-up examination after completed treatment for conditions other than malignant neoplasm: Secondary | ICD-10-CM | POA: Diagnosis not present

## 2011-04-10 DIAGNOSIS — R109 Unspecified abdominal pain: Secondary | ICD-10-CM | POA: Diagnosis not present

## 2011-04-10 MED ORDER — IOHEXOL 300 MG/ML  SOLN
100.0000 mL | Freq: Once | INTRAMUSCULAR | Status: AC | PRN
  Administered 2011-04-10: 100 mL via INTRAVENOUS

## 2011-04-19 DIAGNOSIS — K5730 Diverticulosis of large intestine without perforation or abscess without bleeding: Secondary | ICD-10-CM | POA: Diagnosis not present

## 2011-05-21 DIAGNOSIS — I70209 Unspecified atherosclerosis of native arteries of extremities, unspecified extremity: Secondary | ICD-10-CM | POA: Diagnosis not present

## 2011-05-21 DIAGNOSIS — L851 Acquired keratosis [keratoderma] palmaris et plantaris: Secondary | ICD-10-CM | POA: Diagnosis not present

## 2011-05-21 DIAGNOSIS — L609 Nail disorder, unspecified: Secondary | ICD-10-CM | POA: Diagnosis not present

## 2011-06-06 ENCOUNTER — Emergency Department (HOSPITAL_COMMUNITY)
Admission: EM | Admit: 2011-06-06 | Discharge: 2011-06-06 | Disposition: A | Discharge status: Home / Self Care | Payer: Medicare Other | Attending: Emergency Medicine | Admitting: Emergency Medicine

## 2011-06-06 ENCOUNTER — Encounter (HOSPITAL_COMMUNITY): Payer: Self-pay | Admitting: Emergency Medicine

## 2011-06-06 ENCOUNTER — Emergency Department (HOSPITAL_COMMUNITY): Payer: Medicare Other

## 2011-06-06 DIAGNOSIS — R05 Cough: Secondary | ICD-10-CM | POA: Insufficient documentation

## 2011-06-06 DIAGNOSIS — R0602 Shortness of breath: Secondary | ICD-10-CM | POA: Diagnosis not present

## 2011-06-06 DIAGNOSIS — E785 Hyperlipidemia, unspecified: Secondary | ICD-10-CM | POA: Insufficient documentation

## 2011-06-06 DIAGNOSIS — R079 Chest pain, unspecified: Secondary | ICD-10-CM | POA: Insufficient documentation

## 2011-06-06 DIAGNOSIS — R072 Precordial pain: Secondary | ICD-10-CM | POA: Diagnosis not present

## 2011-06-06 DIAGNOSIS — R059 Cough, unspecified: Secondary | ICD-10-CM | POA: Diagnosis not present

## 2011-06-06 DIAGNOSIS — R0789 Other chest pain: Secondary | ICD-10-CM | POA: Diagnosis not present

## 2011-06-06 DIAGNOSIS — R110 Nausea: Secondary | ICD-10-CM | POA: Insufficient documentation

## 2011-06-06 DIAGNOSIS — Z87891 Personal history of nicotine dependence: Secondary | ICD-10-CM | POA: Insufficient documentation

## 2011-06-06 DIAGNOSIS — I10 Essential (primary) hypertension: Secondary | ICD-10-CM | POA: Insufficient documentation

## 2011-06-06 DIAGNOSIS — I498 Other specified cardiac arrhythmias: Secondary | ICD-10-CM | POA: Diagnosis not present

## 2011-06-06 DIAGNOSIS — R070 Pain in throat: Secondary | ICD-10-CM | POA: Diagnosis not present

## 2011-06-06 LAB — BASIC METABOLIC PANEL
BUN: 13 mg/dL (ref 6–23)
CO2: 30 mEq/L (ref 19–32)
Glucose, Bld: 112 mg/dL — ABNORMAL HIGH (ref 70–99)
Potassium: 4.2 mEq/L (ref 3.5–5.1)
Sodium: 142 mEq/L (ref 135–145)

## 2011-06-06 LAB — POCT I-STAT TROPONIN I: Troponin i, poc: 0 ng/mL (ref 0.00–0.08)

## 2011-06-06 LAB — CBC
HCT: 42.2 % (ref 36.0–46.0)
Hemoglobin: 14.9 g/dL (ref 12.0–15.0)
MCV: 87.2 fL (ref 78.0–100.0)
RDW: 12.5 % (ref 11.5–15.5)
WBC: 8.4 10*3/uL (ref 4.0–10.5)

## 2011-06-06 MED ORDER — GUAIFENESIN-CODEINE 100-10 MG/5ML PO SYRP: 5.0000 mL | ORAL_SOLUTION | Freq: Three times a day (TID) | ORAL | Status: AC | PRN

## 2011-06-06 MED ORDER — NITROGLYCERIN 0.4 MG SL SUBL
0.4000 mg | SUBLINGUAL_TABLET | SUBLINGUAL | Status: AC | PRN
  Administered 2011-06-06 (×3): 0.4 mg via SUBLINGUAL
  Filled 2011-06-06: qty 25

## 2011-06-06 NOTE — ED Notes (Signed)
Patient developed nausea and chest pressure in AM today. Called primary Doctor seen at 1530 at office. Sent to ED for evaluation.  Patient states chest pressure 8/10 with headache.  Airway intact bilateral equal chest rise and fall No distress noted. Family member at bedside.  Pedal pulses +2 bilateral with trace edema bilateral ankles. Denies shortness of breath.

## 2011-06-06 NOTE — ED Provider Notes (Signed)
History     CSN: [id number]  Arrival date & time 06/06/11  1751   First MD Initiated Contact with Patient 06/06/11 1805      Chief Complaint  Patient presents with  . Chest Pain    (Consider location/radiation/quality/duration/timing/severity/associated sxs/prior treatment) Patient is a 71 y.o. female presenting with chest pain. The history is provided by the patient.  Chest Pain The chest pain began 6 - 12 hours ago. Chest pain occurs constantly. The chest pain is improving. Associated with: no alleviating/aggravating factors. At its most intense, the pain is at 8/10. The pain is currently at 5/10. The severity of the pain is moderate. The quality of the pain is described as dull and heavy. The pain does not radiate. Exacerbated by: no alleviating/aggravating factors. Primary symptoms include shortness of breath, cough (for 1.5 weeks) and nausea. Pertinent negatives for primary symptoms include no fever, no fatigue, no syncope, no wheezing, no abdominal pain, no vomiting and no dizziness. She tried nitroglycerin for the symptoms.  Her past medical history is significant for hyperlipidemia and hypertension.  Pertinent negatives for past medical history include no DVT, no MI and no PE.  Procedure history is positive for stress echo (1-2 yrs ago).     Past Medical History  Diagnosis Date  . Sinus infection   . Hypertension   . High cholesterol   . Insomnia     Past Surgical History  Procedure Date  . Cervical spine surgery   . Back surgery     No family history on file.  History  Substance Use Topics  . Smoking status: Former Smoker -- 4.0 packs/day for 3 years    Types: Cigarettes    Quit date: 02/20/1964  . Smokeless tobacco: Never Used  . Alcohol Use: No    OB History    Grav Para Term Preterm Abortions TAB SAB Ect Mult Living   4 3 3  1   1  3       Review of Systems  Constitutional: Negative for fever and fatigue.  HENT: Negative for neck pain.     Respiratory: Positive for cough (for 1.5 weeks) and shortness of breath. Negative for chest tightness and wheezing.   Cardiovascular: Positive for chest pain. Negative for syncope.  Gastrointestinal: Positive for nausea. Negative for vomiting, abdominal pain and diarrhea.  Genitourinary: Negative for dysuria.  Skin: Negative for rash.  Neurological: Negative for dizziness and headaches.  All other systems reviewed and are negative.    Allergies  Bee venom and Penicillins  Home Medications   Current Outpatient Rx  Name Route Sig Dispense Refill  . ALPRAZOLAM 0.5 MG PO TABS Oral Take 0.25 mg by mouth at bedtime as needed. For sleep    . DILTIAZEM HCL ER COATED BEADS 240 MG PO CP24 Oral Take 240 mg by mouth daily.      Marland Kitchen EZETIMIBE 10 MG PO TABS Oral Take 10 mg by mouth at bedtime.      . FUROSEMIDE 40 MG PO TABS Oral Take 40 mg by mouth daily.      Marland Kitchen SIMVASTATIN 20 MG PO TABS Oral Take 20 mg by mouth at bedtime.      . TRAZODONE HCL 50 MG PO TABS Oral Take 50 mg by mouth at bedtime.        BP 155/66  Pulse 48  Temp(Src) 98 F (36.7 C) (Oral)  Resp 16  SpO2 96%  Physical Exam  Nursing note and vitals reviewed. Constitutional: She is  oriented to person, place, and time. She appears well-developed and well-nourished.  HENT:  Head: Normocephalic and atraumatic.  Eyes: EOM are normal.  Neck: Normal range of motion.  Cardiovascular: Normal rate, regular rhythm and normal heart sounds.   Pulmonary/Chest: Effort normal and breath sounds normal. No respiratory distress. She has no wheezes.  Abdominal: Soft. There is no tenderness.  Musculoskeletal: Normal range of motion. She exhibits no edema.  Neurological: She is alert and oriented to person, place, and time.  Skin: Skin is warm and dry.  Psychiatric: She has a normal mood and affect.    ED Course  Procedures (including critical care time)  Date: 06/06/2011  Rate: 45  Rhythm: sinus bradycardia  QRS Axis: left   Intervals: normal  ST/T Wave abnormalities: normal  Conduction Disutrbances:none  Narrative Interpretation:   Old EKG Reviewed: changes noted; Change in rate from 95-49   Labs Reviewed  BASIC METABOLIC PANEL - Abnormal; Notable for the following:    Glucose, Bld 112 (*)    GFR calc non Af Amer 83 (*)    All other components within normal limits  CBC  PRO B NATRIURETIC PEPTIDE  POCT I-STAT TROPONIN I  POCT I-STAT TROPONIN I   Results for orders placed during the hospital encounter of 06/06/11  CBC      Component Value Range   WBC 8.4  4.0 - 10.5 (K/uL)   RBC 4.84  3.87 - 5.11 (MIL/uL)   Hemoglobin 14.9  12.0 - 15.0 (g/dL)   HCT 09.8  11.9 - 14.7 (%)   MCV 87.2  78.0 - 100.0 (fL)   MCH 30.8  26.0 - 34.0 (pg)   MCHC 35.3  30.0 - 36.0 (g/dL)   RDW 82.9  56.2 - 13.0 (%)   Platelets 227  150 - 400 (K/uL)  PRO B NATRIURETIC PEPTIDE      Component Value Range   Pro B Natriuretic peptide (BNP) 100.4  0 - 125 (pg/mL)  BASIC METABOLIC PANEL      Component Value Range   Sodium 142  135 - 145 (mEq/L)   Potassium 4.2  3.5 - 5.1 (mEq/L)   Chloride 104  96 - 112 (mEq/L)   CO2 30  19 - 32 (mEq/L)   Glucose, Bld 112 (*) 70 - 99 (mg/dL)   BUN 13  6 - 23 (mg/dL)   Creatinine, Ser 8.65  0.50 - 1.10 (mg/dL)   Calcium 78.4  8.4 - 10.5 (mg/dL)   GFR calc non Af Amer 83 (*) >90 (mL/min)   GFR calc Af Amer >90  >90 (mL/min)  POCT I-STAT TROPONIN I      Component Value Range   Troponin i, poc 0.01  0.00 - 0.08 (ng/mL)   Comment 3           POCT I-STAT TROPONIN I      Component Value Range   Troponin i, poc 0.00  0.00 - 0.08 (ng/mL)   Comment 3             Dg Chest Portable 1 View  06/06/2011  *RADIOLOGY REPORT*  Clinical Data: Hypertension.  Chest pain.  PORTABLE CHEST - 1 VIEW  Comparison: 10/24/2006.  Findings: Lung volumes are slightly low.  Lower cervical anterior and posterior fusions. Monitoring leads are projected over the chest.  Chronic bronchitic changes at the right lung base.  Cardiopericardial silhouette mediastinal contours appear within normal limits.  Aortic arch atherosclerosis.  IMPRESSION: Slightly low lung volumes without acute cardiopulmonary  disease.  Original Report Authenticated By: Andreas Newport, M.D.     1. Chest pain   2. Cough       MDM  71 y/o F with 1.5 weeks of cough, now today with ant chest pressure that has been constant since this am.  Improved 8--> 5 with single NTG by PCP.  Has been associated with nausea.   Exam benign with exception in mild bradycardia in mid 50s on my eval. She does have some reproducibility of pain with ant chest wall palpation. No recent med changes. EKG without acute ischemia.   Pt received ASA by PCP.    Pt only with risk factors of HTN, HLD, and age.  No h/o CAD and had negative stress 1-2 years ago. Atypical nature of pain being constant since this am and after 1 week of coughing. Doubt ACS.  Labs benign as above. Delta troponin also negative.  CXR without acute process. Feel patient stable for outpatient f/u.  Patient also eager for discharge. Most likely chest wall pain from coughing.        Donnamarie Poag, MD 06/06/11 [phone]

## 2011-06-06 NOTE — ED Notes (Signed)
Pt resting at this time.  Denies any chest pain or discomfort.  Husband at bedside.

## 2011-06-06 NOTE — ED Notes (Signed)
Onset today in am Chest pressure seen Doctor sent to ED for evaluation. Was given 324mg  aspirin at [facility] office.  EMS gave 1 SL nitro Blood pressure 119/70 to 62/42. Given NS bolus BP 152/62. Stated no pain relief. Ax4.

## 2011-06-07 NOTE — ED Provider Notes (Signed)
I have personally seen and examined the patient.  I have discussed the plan of care with the resident.  I have reviewed the documentation on PMH/FH/Soc. History.  I have reviewed the documentation of the resident and agree.  I have reviewed and agree with the ECG interpretation(s) documented by the resident.  Pt well appearing, CP reproducible, unlikely ACS/PE/Dissection given history   Joya Gaskins, MD 06/07/11 1556

## 2011-06-11 DIAGNOSIS — I498 Other specified cardiac arrhythmias: Secondary | ICD-10-CM | POA: Diagnosis not present

## 2011-06-17 DIAGNOSIS — R079 Chest pain, unspecified: Secondary | ICD-10-CM | POA: Diagnosis not present

## 2011-06-17 DIAGNOSIS — I10 Essential (primary) hypertension: Secondary | ICD-10-CM | POA: Diagnosis not present

## 2011-06-18 DIAGNOSIS — K649 Unspecified hemorrhoids: Secondary | ICD-10-CM | POA: Diagnosis not present

## 2011-06-25 ENCOUNTER — Ambulatory Visit (INDEPENDENT_AMBULATORY_CARE_PROVIDER_SITE_OTHER): Payer: Medicare Other | Admitting: General Surgery

## 2011-06-25 ENCOUNTER — Encounter (INDEPENDENT_AMBULATORY_CARE_PROVIDER_SITE_OTHER): Payer: Self-pay | Admitting: General Surgery

## 2011-06-25 DIAGNOSIS — K649 Unspecified hemorrhoids: Secondary | ICD-10-CM | POA: Diagnosis not present

## 2011-06-25 NOTE — Progress Notes (Signed)
Patient ID: Kaitlyn Serrano, female   DOB: [birth year], 71 y.o.   MRN: [id number]  Chief Complaint  Patient presents with  . Pre-op Exam    eval hems    HPI Kaitlyn Serrano is a 71 y.o. female.   HPI  She is referred by Dr. Nicholas Lose for evaluation of hemorrhoids. She has had hemorrhoids for many years. She stated that in the 1990s Dr. Earlene Plater did an operation for hemorrhoids.  She's been having problems with a tearing-type pain after bowel movements recently had intermittent bright red blood when she has a hard bowel movement. She also has some bleeding when she has frequent bowel movements. 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 She states her last colonoscopy was in 2010. She has some hemorrhoidal cream she's been using and this does help with the discomfort.  Past Medical History  Diagnosis Date  . Sinus infection   . Hypertension   . High cholesterol   . Insomnia   . Blood transfusion   . GERD (gastroesophageal reflux disease)     Past Surgical History  Procedure Date  . Cervical spine surgery   . Back surgery     History reviewed. No pertinent family history.  Social History History  Substance Use Topics  . Smoking status: Former Smoker -- 4.0 packs/day for 3 years    Types: Cigarettes    Quit date: 02/20/1964  . Smokeless tobacco: Never Used  . Alcohol Use: No    Allergies  Allergen Reactions  . Bee Venom Swelling  . Penicillins Other (See Comments)    "PEELS SKIN OFF MOUTH"    Current Outpatient Prescriptions  Medication Sig Dispense Refill  . ALPRAZolam (XANAX) 0.5 MG tablet Take 0.25 mg by mouth at bedtime as needed. For sleep      . diltiazem (CARDIZEM) 120 MG tablet Take 120 mg by mouth 4 (four) times daily.      Marland Kitchen ezetimibe (ZETIA) 10 MG tablet Take 10 mg by mouth at bedtime.        . furosemide (LASIX) 40 MG tablet Take 40 mg by mouth daily.        . simvastatin (ZOCOR) 20 MG tablet Take 20 mg by mouth at bedtime.        . traZODone (DESYREL) 50 MG tablet  Take 50 mg by mouth at bedtime.          Review of Systems Review of Systems  HENT: Positive for congestion.   Gastrointestinal: Positive for diarrhea, constipation, blood in stool, anal bleeding and rectal pain.  Hematological: Bruises/bleeds easily.    Blood pressure 130/84, pulse 61, temperature 97.8 F (36.6 C), temperature source Temporal, height 5\' 3"  (1.6 m), weight 170 lb 12.8 oz (77.474 kg), SpO2 96.00%.  Physical Exam Physical Exam  Constitutional: No distress.       Overweight female.  Genitourinary:       Posterior external hemorrhoid.  Small tears in anal mucosa posteriorly.  No palpable masses or blood.  She has decreased sphincter tone.  Anoscopy-moderated sized internal hemorrhoids in the right anterior and right posterior areas.  No fissures or masses.    Data Reviewed None, but have requested notes from Dr. Marlane Hatcher office.  Assessment    Rectal bleeding likely from internal hemorrhoids.  This is related to straining to have a BM.  Perianal pain is likely secondary to small tears in the posterior anal mucosa.    Plan    Increase fiber in the diet.  Take  a stool softener to avoid hard stools.  Return visit in one month.  With her decreased sphincter tone, I would be hesitant to perform any anal surgery on her.       Krishav Mamone J 06/25/2011, 2:16 PM

## 2011-06-25 NOTE — Patient Instructions (Signed)
Eat a natural high fiber diet.  Apply Vitamin E cream to your anal area after bowel movements.

## 2011-06-28 ENCOUNTER — Encounter (INDEPENDENT_AMBULATORY_CARE_PROVIDER_SITE_OTHER): Payer: Self-pay

## 2011-07-03 DIAGNOSIS — G894 Chronic pain syndrome: Secondary | ICD-10-CM | POA: Diagnosis not present

## 2011-07-03 DIAGNOSIS — M545 Low back pain, unspecified: Secondary | ICD-10-CM | POA: Diagnosis not present

## 2011-07-08 ENCOUNTER — Other Ambulatory Visit: Payer: Self-pay | Admitting: Orthopaedic Surgery

## 2011-07-08 DIAGNOSIS — M545 Low back pain, unspecified: Secondary | ICD-10-CM

## 2011-07-17 ENCOUNTER — Ambulatory Visit
Admission: RE | Admit: 2011-07-17 | Discharge: 2011-07-17 | Disposition: A | Discharge status: Home / Self Care | Payer: Medicare Other | Source: Ambulatory Visit | Attending: Orthopaedic Surgery | Admitting: Orthopaedic Surgery

## 2011-07-17 DIAGNOSIS — M545 Low back pain, unspecified: Secondary | ICD-10-CM

## 2011-07-17 DIAGNOSIS — M5137 Other intervertebral disc degeneration, lumbosacral region: Secondary | ICD-10-CM | POA: Diagnosis not present

## 2011-07-17 DIAGNOSIS — M47817 Spondylosis without myelopathy or radiculopathy, lumbosacral region: Secondary | ICD-10-CM | POA: Diagnosis not present

## 2011-07-17 DIAGNOSIS — M5126 Other intervertebral disc displacement, lumbar region: Secondary | ICD-10-CM | POA: Diagnosis not present

## 2011-07-24 ENCOUNTER — Encounter (INDEPENDENT_AMBULATORY_CARE_PROVIDER_SITE_OTHER): Payer: Self-pay | Admitting: General Surgery

## 2011-07-24 ENCOUNTER — Ambulatory Visit (INDEPENDENT_AMBULATORY_CARE_PROVIDER_SITE_OTHER): Payer: Medicare Other | Admitting: General Surgery

## 2011-07-24 VITALS — BP 116/68 | HR 68 | Temp 97.3°F | Resp 14 | Ht 63.0 in | Wt 167.4 lb

## 2011-07-24 DIAGNOSIS — K589 Irritable bowel syndrome without diarrhea: Secondary | ICD-10-CM | POA: Diagnosis not present

## 2011-07-24 NOTE — Progress Notes (Signed)
Ms.  Tomson is doing better from the rectal bleeding standpoint of ever since she stopped her aspirin.  Her chief complaint today is cramping abdominal pain and diarrhea. These are her typical irritable bowel syndrome symptoms.  Exam: Gaye Alken looks well and in no acute distress.  Abdomen-soft, nontender, no mass, well-healed lower midline scar with no evidence of hernia.  Anal rectal-the small external tears have healed; no fissures; decreased sphincter tone.  Anoscopy-small internal noninflamed hemorrhoid in the right anterior and posterior areas  Assessment: Rectal bleeding has improved and is most likely secondary to the ulcerations as a field and small internal hemorrhoids. She is having increasing problems with her irritable bowel symptoms.  Plan:  Referral to Dr. Ewing Schlein for further evaluation and treatment of her irritable bowel symptoms.

## 2011-07-24 NOTE — Patient Instructions (Signed)
We will make a referral for you to see Dr. Ewing Schlein.

## 2011-08-07 DIAGNOSIS — H4011X0 Primary open-angle glaucoma, stage unspecified: Secondary | ICD-10-CM | POA: Diagnosis not present

## 2011-08-07 DIAGNOSIS — H40059 Ocular hypertension, unspecified eye: Secondary | ICD-10-CM | POA: Diagnosis not present

## 2011-09-03 DIAGNOSIS — M1990 Unspecified osteoarthritis, unspecified site: Secondary | ICD-10-CM | POA: Diagnosis not present

## 2011-09-09 ENCOUNTER — Other Ambulatory Visit: Payer: Self-pay | Admitting: Gastroenterology

## 2011-09-09 DIAGNOSIS — K219 Gastro-esophageal reflux disease without esophagitis: Secondary | ICD-10-CM | POA: Diagnosis not present

## 2011-09-09 DIAGNOSIS — R109 Unspecified abdominal pain: Secondary | ICD-10-CM | POA: Diagnosis not present

## 2011-09-09 DIAGNOSIS — K589 Irritable bowel syndrome without diarrhea: Secondary | ICD-10-CM | POA: Diagnosis not present

## 2011-09-11 ENCOUNTER — Ambulatory Visit
Admission: RE | Admit: 2011-09-11 | Discharge: 2011-09-11 | Disposition: A | Discharge status: Home / Self Care | Payer: Medicare Other | Source: Ambulatory Visit | Attending: Gastroenterology | Admitting: Gastroenterology

## 2011-09-11 ENCOUNTER — Telehealth (INDEPENDENT_AMBULATORY_CARE_PROVIDER_SITE_OTHER): Payer: Self-pay

## 2011-09-11 DIAGNOSIS — R109 Unspecified abdominal pain: Secondary | ICD-10-CM | POA: Diagnosis not present

## 2011-09-11 DIAGNOSIS — K7689 Other specified diseases of liver: Secondary | ICD-10-CM | POA: Diagnosis not present

## 2011-09-11 NOTE — Telephone Encounter (Signed)
Called pt to give her [place] results.  I will check w/ Dr. Abbey Chatters to see if she needs to come back to the office and see him.

## 2011-09-16 DIAGNOSIS — K7689 Other specified diseases of liver: Secondary | ICD-10-CM | POA: Diagnosis not present

## 2011-09-26 DIAGNOSIS — I10 Essential (primary) hypertension: Secondary | ICD-10-CM | POA: Diagnosis not present

## 2011-09-26 DIAGNOSIS — E785 Hyperlipidemia, unspecified: Secondary | ICD-10-CM | POA: Diagnosis not present

## 2011-09-30 DIAGNOSIS — Z1212 Encounter for screening for malignant neoplasm of rectum: Secondary | ICD-10-CM | POA: Diagnosis not present

## 2011-10-03 ENCOUNTER — Other Ambulatory Visit: Payer: Self-pay | Admitting: Family Medicine

## 2011-10-03 DIAGNOSIS — M545 Low back pain, unspecified: Secondary | ICD-10-CM | POA: Diagnosis not present

## 2011-10-03 DIAGNOSIS — G894 Chronic pain syndrome: Secondary | ICD-10-CM | POA: Diagnosis not present

## 2011-10-03 DIAGNOSIS — M1990 Unspecified osteoarthritis, unspecified site: Secondary | ICD-10-CM | POA: Diagnosis not present

## 2011-10-03 DIAGNOSIS — Z1231 Encounter for screening mammogram for malignant neoplasm of breast: Secondary | ICD-10-CM

## 2011-10-03 DIAGNOSIS — M542 Cervicalgia: Secondary | ICD-10-CM | POA: Diagnosis not present

## 2011-10-16 ENCOUNTER — Ambulatory Visit (HOSPITAL_COMMUNITY)
Admission: RE | Admit: 2011-10-16 | Discharge: 2011-10-16 | Disposition: A | Discharge status: Home / Self Care | Payer: Medicare Other | Source: Ambulatory Visit | Attending: Family Medicine | Admitting: Family Medicine

## 2011-10-16 DIAGNOSIS — Z1231 Encounter for screening mammogram for malignant neoplasm of breast: Secondary | ICD-10-CM | POA: Diagnosis not present

## 2011-10-22 DIAGNOSIS — K921 Melena: Secondary | ICD-10-CM | POA: Diagnosis not present

## 2011-10-22 DIAGNOSIS — A09 Infectious gastroenteritis and colitis, unspecified: Secondary | ICD-10-CM | POA: Diagnosis not present

## 2011-10-22 DIAGNOSIS — R109 Unspecified abdominal pain: Secondary | ICD-10-CM | POA: Diagnosis not present

## 2011-10-31 DIAGNOSIS — G894 Chronic pain syndrome: Secondary | ICD-10-CM | POA: Diagnosis not present

## 2011-11-05 DIAGNOSIS — M659 Synovitis and tenosynovitis, unspecified: Secondary | ICD-10-CM | POA: Diagnosis not present

## 2011-11-05 DIAGNOSIS — M79609 Pain in unspecified limb: Secondary | ICD-10-CM | POA: Diagnosis not present

## 2011-11-20 ENCOUNTER — Other Ambulatory Visit: Payer: Self-pay | Admitting: Gastroenterology

## 2011-11-20 DIAGNOSIS — R197 Diarrhea, unspecified: Secondary | ICD-10-CM | POA: Diagnosis not present

## 2011-11-20 DIAGNOSIS — D126 Benign neoplasm of colon, unspecified: Secondary | ICD-10-CM | POA: Diagnosis not present

## 2011-11-20 DIAGNOSIS — K5730 Diverticulosis of large intestine without perforation or abscess without bleeding: Secondary | ICD-10-CM | POA: Diagnosis not present

## 2011-11-20 DIAGNOSIS — R1084 Generalized abdominal pain: Secondary | ICD-10-CM | POA: Diagnosis not present

## 2011-11-20 DIAGNOSIS — Z86010 Personal history of colonic polyps: Secondary | ICD-10-CM | POA: Diagnosis not present

## 2011-11-25 DIAGNOSIS — Z23 Encounter for immunization: Secondary | ICD-10-CM | POA: Diagnosis not present

## 2011-11-26 DIAGNOSIS — G894 Chronic pain syndrome: Secondary | ICD-10-CM | POA: Diagnosis not present

## 2011-11-26 DIAGNOSIS — M545 Low back pain, unspecified: Secondary | ICD-10-CM | POA: Diagnosis not present

## 2011-11-26 DIAGNOSIS — M1990 Unspecified osteoarthritis, unspecified site: Secondary | ICD-10-CM | POA: Diagnosis not present

## 2011-11-26 DIAGNOSIS — M542 Cervicalgia: Secondary | ICD-10-CM | POA: Diagnosis not present

## 2011-12-03 DIAGNOSIS — J209 Acute bronchitis, unspecified: Secondary | ICD-10-CM | POA: Diagnosis not present

## 2011-12-12 DIAGNOSIS — I10 Essential (primary) hypertension: Secondary | ICD-10-CM | POA: Diagnosis not present

## 2011-12-12 DIAGNOSIS — R5381 Other malaise: Secondary | ICD-10-CM | POA: Diagnosis not present

## 2011-12-12 DIAGNOSIS — E782 Mixed hyperlipidemia: Secondary | ICD-10-CM | POA: Diagnosis not present

## 2011-12-12 DIAGNOSIS — J209 Acute bronchitis, unspecified: Secondary | ICD-10-CM | POA: Diagnosis not present

## 2011-12-12 DIAGNOSIS — R6889 Other general symptoms and signs: Secondary | ICD-10-CM | POA: Diagnosis not present

## 2011-12-16 ENCOUNTER — Other Ambulatory Visit: Payer: Self-pay | Admitting: Gastroenterology

## 2011-12-16 DIAGNOSIS — R1310 Dysphagia, unspecified: Secondary | ICD-10-CM

## 2011-12-16 DIAGNOSIS — K589 Irritable bowel syndrome without diarrhea: Secondary | ICD-10-CM | POA: Diagnosis not present

## 2011-12-16 DIAGNOSIS — K921 Melena: Secondary | ICD-10-CM | POA: Diagnosis not present

## 2011-12-23 ENCOUNTER — Ambulatory Visit
Admission: RE | Admit: 2011-12-23 | Discharge: 2011-12-23 | Disposition: A | Discharge status: Home / Self Care | Payer: Medicare Other | Source: Ambulatory Visit | Attending: Gastroenterology | Admitting: Gastroenterology

## 2011-12-23 DIAGNOSIS — K219 Gastro-esophageal reflux disease without esophagitis: Secondary | ICD-10-CM | POA: Diagnosis not present

## 2011-12-23 DIAGNOSIS — R1310 Dysphagia, unspecified: Secondary | ICD-10-CM

## 2011-12-25 DIAGNOSIS — K2940 Chronic atrophic gastritis without bleeding: Secondary | ICD-10-CM | POA: Diagnosis not present

## 2011-12-25 DIAGNOSIS — R1310 Dysphagia, unspecified: Secondary | ICD-10-CM | POA: Diagnosis not present

## 2011-12-25 DIAGNOSIS — K224 Dyskinesia of esophagus: Secondary | ICD-10-CM | POA: Diagnosis not present

## 2011-12-25 DIAGNOSIS — R933 Abnormal findings on diagnostic imaging of other parts of digestive tract: Secondary | ICD-10-CM | POA: Diagnosis not present

## 2012-02-11 DIAGNOSIS — H40059 Ocular hypertension, unspecified eye: Secondary | ICD-10-CM | POA: Diagnosis not present

## 2012-02-11 DIAGNOSIS — H26499 Other secondary cataract, unspecified eye: Secondary | ICD-10-CM | POA: Diagnosis not present

## 2012-02-11 DIAGNOSIS — Z961 Presence of intraocular lens: Secondary | ICD-10-CM | POA: Diagnosis not present

## 2012-02-11 DIAGNOSIS — H40019 Open angle with borderline findings, low risk, unspecified eye: Secondary | ICD-10-CM | POA: Diagnosis not present

## 2012-03-06 DIAGNOSIS — M545 Low back pain, unspecified: Secondary | ICD-10-CM | POA: Diagnosis not present

## 2012-03-06 DIAGNOSIS — G894 Chronic pain syndrome: Secondary | ICD-10-CM | POA: Diagnosis not present

## 2012-03-06 DIAGNOSIS — IMO0001 Reserved for inherently not codable concepts without codable children: Secondary | ICD-10-CM | POA: Diagnosis not present

## 2012-03-06 DIAGNOSIS — M1990 Unspecified osteoarthritis, unspecified site: Secondary | ICD-10-CM | POA: Diagnosis not present

## 2012-03-30 DIAGNOSIS — Z01419 Encounter for gynecological examination (general) (routine) without abnormal findings: Secondary | ICD-10-CM | POA: Diagnosis not present

## 2012-03-30 DIAGNOSIS — I10 Essential (primary) hypertension: Secondary | ICD-10-CM | POA: Diagnosis not present

## 2012-04-14 DIAGNOSIS — N390 Urinary tract infection, site not specified: Secondary | ICD-10-CM | POA: Diagnosis not present

## 2012-04-14 DIAGNOSIS — B029 Zoster without complications: Secondary | ICD-10-CM | POA: Diagnosis not present

## 2012-04-27 DIAGNOSIS — R5381 Other malaise: Secondary | ICD-10-CM | POA: Diagnosis not present

## 2012-04-27 DIAGNOSIS — R609 Edema, unspecified: Secondary | ICD-10-CM | POA: Diagnosis not present

## 2012-04-27 DIAGNOSIS — E782 Mixed hyperlipidemia: Secondary | ICD-10-CM | POA: Diagnosis not present

## 2012-04-27 DIAGNOSIS — Z79899 Other long term (current) drug therapy: Secondary | ICD-10-CM | POA: Diagnosis not present

## 2012-04-27 DIAGNOSIS — I872 Venous insufficiency (chronic) (peripheral): Secondary | ICD-10-CM | POA: Diagnosis not present

## 2012-04-27 DIAGNOSIS — I10 Essential (primary) hypertension: Secondary | ICD-10-CM | POA: Diagnosis not present

## 2012-04-28 ENCOUNTER — Other Ambulatory Visit (HOSPITAL_COMMUNITY): Payer: Self-pay | Admitting: Cardiovascular Disease

## 2012-04-28 DIAGNOSIS — R609 Edema, unspecified: Secondary | ICD-10-CM

## 2012-04-28 DIAGNOSIS — I872 Venous insufficiency (chronic) (peripheral): Secondary | ICD-10-CM

## 2012-05-07 DIAGNOSIS — G894 Chronic pain syndrome: Secondary | ICD-10-CM | POA: Diagnosis not present

## 2012-05-07 DIAGNOSIS — IMO0001 Reserved for inherently not codable concepts without codable children: Secondary | ICD-10-CM | POA: Diagnosis not present

## 2012-05-11 ENCOUNTER — Other Ambulatory Visit: Payer: Self-pay | Admitting: Orthopaedic Surgery

## 2012-05-11 DIAGNOSIS — M545 Low back pain, unspecified: Secondary | ICD-10-CM

## 2012-05-12 ENCOUNTER — Other Ambulatory Visit: Payer: Self-pay | Admitting: Nurse Practitioner

## 2012-05-12 DIAGNOSIS — F411 Generalized anxiety disorder: Secondary | ICD-10-CM

## 2012-05-12 MED ORDER — ALPRAZOLAM 0.5 MG PO TABS: 0.2500 mg | ORAL_TABLET | Freq: Every evening | ORAL | Status: DC | PRN

## 2012-05-17 ENCOUNTER — Ambulatory Visit
Admission: RE | Admit: 2012-05-17 | Discharge: 2012-05-17 | Disposition: A | Discharge status: Home / Self Care | Payer: Medicare Other | Source: Ambulatory Visit | Attending: Orthopaedic Surgery | Admitting: Orthopaedic Surgery

## 2012-05-17 DIAGNOSIS — M545 Low back pain, unspecified: Secondary | ICD-10-CM

## 2012-05-17 DIAGNOSIS — M5137 Other intervertebral disc degeneration, lumbosacral region: Secondary | ICD-10-CM | POA: Diagnosis not present

## 2012-05-17 DIAGNOSIS — M47817 Spondylosis without myelopathy or radiculopathy, lumbosacral region: Secondary | ICD-10-CM | POA: Diagnosis not present

## 2012-05-18 ENCOUNTER — Ambulatory Visit (HOSPITAL_COMMUNITY)
Admission: RE | Admit: 2012-05-18 | Discharge: 2012-05-18 | Disposition: A | Discharge status: Home / Self Care | Payer: Medicare Other | Source: Ambulatory Visit | Attending: Cardiovascular Disease | Admitting: Cardiovascular Disease

## 2012-05-18 DIAGNOSIS — I872 Venous insufficiency (chronic) (peripheral): Secondary | ICD-10-CM | POA: Diagnosis not present

## 2012-05-18 DIAGNOSIS — R609 Edema, unspecified: Secondary | ICD-10-CM | POA: Insufficient documentation

## 2012-05-18 DIAGNOSIS — M7989 Other specified soft tissue disorders: Secondary | ICD-10-CM | POA: Diagnosis not present

## 2012-05-18 NOTE — Progress Notes (Signed)
Venous Duplex Lower Ext. Completed. Kaitlyn Serrano D  

## 2012-06-18 DIAGNOSIS — L600 Ingrowing nail: Secondary | ICD-10-CM | POA: Diagnosis not present

## 2012-06-23 ENCOUNTER — Encounter: Payer: Medicare Other | Admitting: Family Medicine

## 2012-06-23 VITALS — BP 138/90 | HR 92 | Temp 97.6°F | Ht 63.0 in | Wt 180.0 lb

## 2012-06-23 DIAGNOSIS — R059 Cough, unspecified: Secondary | ICD-10-CM

## 2012-06-23 DIAGNOSIS — R05 Cough: Secondary | ICD-10-CM

## 2012-06-23 NOTE — Progress Notes (Signed)
PT LEFT WITHOUT BEING SEEN. REFUSED TO WAIT.

## 2012-06-24 DIAGNOSIS — M1990 Unspecified osteoarthritis, unspecified site: Secondary | ICD-10-CM | POA: Diagnosis not present

## 2012-06-24 DIAGNOSIS — G894 Chronic pain syndrome: Secondary | ICD-10-CM | POA: Diagnosis not present

## 2012-06-24 DIAGNOSIS — M545 Low back pain, unspecified: Secondary | ICD-10-CM | POA: Diagnosis not present

## 2012-06-24 DIAGNOSIS — IMO0001 Reserved for inherently not codable concepts without codable children: Secondary | ICD-10-CM | POA: Diagnosis not present

## 2012-06-24 NOTE — Progress Notes (Signed)
Patient ID: Kaitlyn Serrano, female   DOB: [birth year], 72 y.o.   MRN: [id number] Patient left  Not seen. Cyprian Gongaware P. [place] Charon, M.D.

## 2012-06-25 ENCOUNTER — Other Ambulatory Visit: Payer: Self-pay | Admitting: Nurse Practitioner

## 2012-06-26 NOTE — Telephone Encounter (Signed)
Last seen [facility] on 04/14/12, last filled 03/19/12

## 2012-06-30 ENCOUNTER — Encounter: Payer: Self-pay | Admitting: Family Medicine

## 2012-06-30 ENCOUNTER — Telehealth: Payer: Self-pay | Admitting: Nurse Practitioner

## 2012-06-30 ENCOUNTER — Ambulatory Visit (INDEPENDENT_AMBULATORY_CARE_PROVIDER_SITE_OTHER): Payer: Medicare Other | Admitting: Family Medicine

## 2012-06-30 VITALS — BP 127/80 | HR 97 | Temp 97.3°F | Wt 175.6 lb

## 2012-06-30 DIAGNOSIS — J309 Allergic rhinitis, unspecified: Secondary | ICD-10-CM

## 2012-06-30 DIAGNOSIS — J029 Acute pharyngitis, unspecified: Secondary | ICD-10-CM | POA: Insufficient documentation

## 2012-06-30 DIAGNOSIS — R062 Wheezing: Secondary | ICD-10-CM | POA: Diagnosis not present

## 2012-06-30 DIAGNOSIS — J302 Other seasonal allergic rhinitis: Secondary | ICD-10-CM | POA: Insufficient documentation

## 2012-06-30 LAB — POCT RAPID STREP A (OFFICE): Rapid Strep A Screen: NEGATIVE

## 2012-06-30 MED ORDER — MONTELUKAST SODIUM 10 MG PO TABS: 10.0000 mg | ORAL_TABLET | Freq: Every day | ORAL | Status: DC

## 2012-06-30 MED ORDER — METHYLPREDNISOLONE ACETATE 80 MG/ML IJ SUSP
80.0000 mg | Freq: Once | INTRAMUSCULAR | Status: AC
  Administered 2012-06-30: 80 mg via INTRAMUSCULAR

## 2012-06-30 NOTE — Progress Notes (Signed)
Tolerated injection well  80 mg depo-medrol given IM rt upper outer quadrant

## 2012-06-30 NOTE — Progress Notes (Signed)
Subjective:    Patient ID: Kaitlyn Serrano, female    DOB: [birth year], 72 y.o.   MRN: [id number]  HPI Congested WUJ:[id number]     has had runny and itchy eyes. has had runny, itchy and stuffy nose. has had Sneezing as well. has had Coughing has had Wheezing  Medications used for this problem:claritin  has not had effective response.  Past Medical History  Diagnosis Date  . Sinus infection   . Hypertension   . High cholesterol   . Insomnia   . Blood transfusion   . GERD (gastroesophageal reflux disease)   . Hemorrhoids   . Glaucoma   . Diverticulosis    Past Surgical History  Procedure Laterality Date  . Cervical spine surgery    . Back surgery     Current Outpatient Prescriptions on File Prior to Visit  Medication Sig Dispense Refill  . ALPRAZolam (XANAX) 0.5 MG tablet Take 0.5 tablets (0.25 mg total) by mouth at bedtime as needed. For sleep  30 tablet  0  . atenolol (TENORMIN) 50 MG tablet Take 50 mg by mouth daily.      . cyclobenzaprine (FLEXERIL) 10 MG tablet Take 10 mg by mouth 3 (three) times daily as needed for muscle spasms.      Marland Kitchen diltiazem (CARDIZEM) 120 MG tablet Take 120 mg by mouth daily.       Marland Kitchen ezetimibe (ZETIA) 10 MG tablet Take 10 mg by mouth at bedtime.        . furosemide (LASIX) 40 MG tablet Take 40 mg by mouth daily.        . simvastatin (ZOCOR) 20 MG tablet Take 20 mg by mouth at bedtime.        . traZODone (DESYREL) 50 MG tablet TAKE ONE TABLET AT BEDTIME  30 tablet  0   No current facility-administered medications on file prior to visit.   Allergies  Allergen Reactions  . Bee Venom Swelling  . Penicillins Other (See Comments)    "PEELS SKIN OFF MOUTH"    There is no immunization history on file for this patient. History   Social History  . Marital Status: Widowed    Spouse Name: N/A    Number of Children: N/A  . Years of Education: N/A   Occupational History  . Not on file.   Social History Main Topics  . Smoking status: Former  Smoker -- 4.00 packs/day for 3 years    Types: Cigarettes    Quit date: 02/20/1964  . Smokeless tobacco: Former Neurosurgeon  . Alcohol Use: No  . Drug Use: No  . Sexually Active: Yes    Birth Control/ Protection: None   Other Topics Concern  . Not on file   Social History Narrative  . No narrative on file    Review of Systems  Constitutional: Positive for fatigue.  HENT: Positive for congestion, rhinorrhea, sneezing and postnasal drip.   Eyes: Positive for itching.  Respiratory: Positive for cough and wheezing. Negative for apnea, choking, chest tightness, shortness of breath and stridor.   Cardiovascular: Negative.   Gastrointestinal: Negative.   Endocrine: Negative.   Genitourinary: Negative.   Musculoskeletal: Negative.   Skin: Negative.   Allergic/Immunologic: Negative.   Neurological: Negative.   Hematological: Negative.   Psychiatric/Behavioral: Negative.        Objective:   Physical Exam APPEARANCE:  Patient in no acute distress.The patient appeared well nourished and normally developed. Acyanotic. Waist: VITAL SIGNS:BP 127/80  Pulse 97  Temp(Src) 97.3  F (36.3 C) (Oral)  Wt 175 lb 9.6 oz (79.652 kg)  BMI 31.11 kg/m2  SpO2 98%   SKIN: warm and  Dry without overt rashes, tattoos and scars  HEAD and Neck: without JVD, Head and scalp: normal Eyes:No scleral icterus. Fundi normal, eye movements normal.congested with mild lacrimation. Ears: Auricle normal, canal normal, Tympanic membranes normal, insufflation normal. Nose: rhinorrhea with rhinitis. Throat: post nasal drip. Neck & thyroid: normal  CHEST & LUNGS: Chest wall: normal Lungs:scatterred wheezes. No Rales. No crackles,   CVS: Reveals the PMI to be normally located. Regular rhythm, First and Second Heart sounds are normal,  absence of murmurs, rubs or gallops. Peripheral vasculature: Radial pulses: normal Dorsal pedis pulses: normal Posterior pulses: normal  ABDOMEN:   Appearance:obese Benign,, no organomegaly, no masses, no Abdominal Aortic enlargement. No Guarding , no rebound. No Bruits. Bowel sounds: normal  NEUROLOGIC: oriented to time,place and person; nonfocal.     Assessment & Plan:  Sore throat - Plan: POCT rapid strep A  Seasonal allergic rhinitis - Plan: montelukast (SINGULAIR) 10 MG tablet, methylPREDNISolone acetate (DEPO-MEDROL) injection 80 mg, DISCONTINUED: montelukast (SINGULAIR) 10 MG tablet  Wheezing - Plan: montelukast (SINGULAIR) 10 MG tablet, methylPREDNISolone acetate (DEPO-MEDROL) injection 80 mg, DISCONTINUED: montelukast (SINGULAIR) 10 MG tablet  Results for orders placed in visit on 06/30/12  POCT RAPID STREP A (OFFICE)      Result Value Range   Rapid Strep A Screen Negative  Negative   Continue with claritin, use the pulse dose of prednisone, added singulair.  RTC in 4 weeks.  Amisha Pospisil P. [place] Charon, M.D.

## 2012-06-30 NOTE — Patient Instructions (Signed)
Methylprednisolone Suspension for Injection What is this medicine? METHYLPREDNISOLONE (meth ill pred NISS oh lone) is a corticosteroid. It is commonly used to treat inflammation of the skin, joints, lungs, and other organs. Common conditions treated include asthma, allergies, and arthritis. It is also used for other conditions, such as blood disorders and diseases of the adrenal glands. This medicine may be used for other purposes; ask your health care provider or pharmacist if you have questions. What should I tell my health care provider before I take this medicine? They need to know if you have any of these conditions: -cataracts or glaucoma -Cushings -heart disease -high blood pressure -infection including tuberculosis -low calcium or potassium levels in the blood -recent surgery -seizures -stomach or intestinal disease, including colitis -threadworms -thyroid problems -an unusual or allergic reaction to methylprednisolone, corticosteroids, benzyl alcohol, other medicines, foods, dyes, or preservatives -pregnant or trying to get pregnant -breast-feeding How should I use this medicine? This medicine is for injection into a muscle, joint, or other tissue. It is given by a health care professional in a hospital or clinic setting. Talk to your pediatrician regarding the use of this medicine in children. While this drug may be prescribed for selected conditions, precautions do apply. Overdosage: If you think you have taken too much of this medicine contact a poison control center or emergency room at once. NOTE: This medicine is only for you. Do not share this medicine with others. What if I miss a dose? This does not apply. What may interact with this medicine? Do not take this medicine with any of the following medications: -mifepristone -radiopaque contrast agents This medicine may also interact with the following medications: -aspirin and aspirin-like  medicines -cyclosporin -ketoconazole -phenobarbital -phenytoin -rifampin -tacrolimus -troleandomycin -vaccines -warfarin This list may not describe all possible interactions. Give your health care provider a list of all the medicines, herbs, non-prescription drugs, or dietary supplements you use. Also tell them if you smoke, drink alcohol, or use illegal drugs. Some items may interact with your medicine. What should I watch for while using this medicine? Visit your doctor or health care professional for regular checks on your progress. If you are taking this medicine for a long time, carry an identification card with your name and address, the type and dose of your medicine, and your doctor's name and address. The medicine may increase your risk of getting an infection. Stay away from people who are sick. Tell your doctor or health care professional if you are around anyone with measles or chickenpox. You may need to avoid some vaccines. Talk to your health care provider for more information. If you are going to have surgery, tell your doctor or health care professional that you have taken this medicine within the last twelve months. Ask your doctor or health care professional about your diet. You may need to lower the amount of salt you eat. The medicine can increase your blood sugar. If you are a diabetic check with your doctor if you need help adjusting the dose of your diabetic medicine. What side effects may I notice from receiving this medicine? Side effects that you should report to your doctor or health care professional as soon as possible: -allergic reactions like skin rash, itching or hives, swelling of the face, lips, or tongue -bloody or tarry stools -changes in vision -eye pain or bulging eyes -fever, sore throat, sneezing, cough, or other signs of infection, wounds that will not heal -increased thirst -irregular heartbeat -muscle cramps -pain   in hips, back, ribs, arms,  shoulders, or legs -swelling of the ankles, feet, hands -trouble passing urine or change in the amount of urine -unusual bleeding or bruising -unusually weak or tired -weight gain or weight loss Side effects that usually do not require medical attention (report to your doctor or health care professional if they continue or are bothersome): -changes in emotions or moods -constipation or diarrhea -headache -irritation at site where injected -nausea, vomiting -skin problems, acne, thin and shiny skin -trouble sleeping -unusual hair growth on the face or body This list may not describe all possible side effects. Call your doctor for medical advice about side effects. You may report side effects to FDA at 1-800-FDA-1088. Where should I keep my medicine? This drug is given in a hospital or clinic and will not be stored at home. NOTE: This sheet is a summary. It may not cover all possible information. If you have questions about this medicine, talk to your doctor, pharmacist, or health care provider.  2013, [employer]. (09/02/2007 2:36:31 PM)  

## 2012-06-30 NOTE — Telephone Encounter (Signed)
appt for pm clinic tonight

## 2012-07-02 DIAGNOSIS — L03039 Cellulitis of unspecified toe: Secondary | ICD-10-CM | POA: Diagnosis not present

## 2012-07-31 DIAGNOSIS — F329 Major depressive disorder, single episode, unspecified: Secondary | ICD-10-CM | POA: Diagnosis present

## 2012-07-31 DIAGNOSIS — M4716 Other spondylosis with myelopathy, lumbar region: Secondary | ICD-10-CM | POA: Diagnosis not present

## 2012-07-31 DIAGNOSIS — M48061 Spinal stenosis, lumbar region without neurogenic claudication: Secondary | ICD-10-CM | POA: Diagnosis not present

## 2012-07-31 DIAGNOSIS — G894 Chronic pain syndrome: Secondary | ICD-10-CM | POA: Diagnosis not present

## 2012-07-31 DIAGNOSIS — M1990 Unspecified osteoarthritis, unspecified site: Secondary | ICD-10-CM | POA: Diagnosis not present

## 2012-07-31 DIAGNOSIS — M5106 Intervertebral disc disorders with myelopathy, lumbar region: Secondary | ICD-10-CM | POA: Diagnosis not present

## 2012-07-31 DIAGNOSIS — I10 Essential (primary) hypertension: Secondary | ICD-10-CM | POA: Diagnosis not present

## 2012-07-31 DIAGNOSIS — Z880 Allergy status to penicillin: Secondary | ICD-10-CM | POA: Diagnosis not present

## 2012-07-31 DIAGNOSIS — K219 Gastro-esophageal reflux disease without esophagitis: Secondary | ICD-10-CM | POA: Diagnosis present

## 2012-07-31 DIAGNOSIS — M545 Low back pain, unspecified: Secondary | ICD-10-CM | POA: Diagnosis not present

## 2012-07-31 DIAGNOSIS — IMO0002 Reserved for concepts with insufficient information to code with codable children: Secondary | ICD-10-CM | POA: Diagnosis not present

## 2012-07-31 DIAGNOSIS — T84498A Other mechanical complication of other internal orthopedic devices, implants and grafts, initial encounter: Secondary | ICD-10-CM | POA: Diagnosis not present

## 2012-07-31 DIAGNOSIS — M4120 Other idiopathic scoliosis, site unspecified: Secondary | ICD-10-CM | POA: Diagnosis not present

## 2012-07-31 DIAGNOSIS — N393 Stress incontinence (female) (male): Secondary | ICD-10-CM | POA: Diagnosis present

## 2012-07-31 DIAGNOSIS — M961 Postlaminectomy syndrome, not elsewhere classified: Secondary | ICD-10-CM | POA: Diagnosis not present

## 2012-07-31 DIAGNOSIS — Z79899 Other long term (current) drug therapy: Secondary | ICD-10-CM | POA: Diagnosis not present

## 2012-07-31 DIAGNOSIS — IMO0001 Reserved for inherently not codable concepts without codable children: Secondary | ICD-10-CM | POA: Diagnosis not present

## 2012-07-31 DIAGNOSIS — Z981 Arthrodesis status: Secondary | ICD-10-CM | POA: Diagnosis not present

## 2012-07-31 DIAGNOSIS — M810 Age-related osteoporosis without current pathological fracture: Secondary | ICD-10-CM | POA: Diagnosis not present

## 2012-08-07 ENCOUNTER — Other Ambulatory Visit: Payer: Self-pay | Admitting: Nurse Practitioner

## 2012-08-10 NOTE — Telephone Encounter (Signed)
Last filled 06/25/12, last seen 06/30/12. If approved have nurse call Trazadone into [place] rx

## 2012-08-11 ENCOUNTER — Other Ambulatory Visit: Payer: Self-pay | Admitting: Nurse Practitioner

## 2012-08-13 ENCOUNTER — Ambulatory Visit (INDEPENDENT_AMBULATORY_CARE_PROVIDER_SITE_OTHER): Payer: Medicare Other | Admitting: Nurse Practitioner

## 2012-08-13 ENCOUNTER — Encounter: Payer: Self-pay | Admitting: Nurse Practitioner

## 2012-08-13 ENCOUNTER — Ambulatory Visit (INDEPENDENT_AMBULATORY_CARE_PROVIDER_SITE_OTHER): Payer: Medicare Other

## 2012-08-13 ENCOUNTER — Telehealth: Payer: Self-pay | Admitting: Family Medicine

## 2012-08-13 DIAGNOSIS — F411 Generalized anxiety disorder: Secondary | ICD-10-CM | POA: Diagnosis not present

## 2012-08-13 DIAGNOSIS — K5900 Constipation, unspecified: Secondary | ICD-10-CM | POA: Diagnosis not present

## 2012-08-13 DIAGNOSIS — E785 Hyperlipidemia, unspecified: Secondary | ICD-10-CM | POA: Insufficient documentation

## 2012-08-13 DIAGNOSIS — B370 Candidal stomatitis: Secondary | ICD-10-CM

## 2012-08-13 DIAGNOSIS — I10 Essential (primary) hypertension: Secondary | ICD-10-CM | POA: Insufficient documentation

## 2012-08-13 MED ORDER — ALPRAZOLAM 0.5 MG PO TABS: 0.2500 mg | ORAL_TABLET | Freq: Every evening | ORAL | Status: DC | PRN

## 2012-08-13 MED ORDER — LUBIPROSTONE 24 MCG PO CAPS: 24.0000 ug | ORAL_CAPSULE | Freq: Two times a day (BID) | ORAL | Status: DC

## 2012-08-13 MED ORDER — MAGIC MOUTHWASH W/LIDOCAINE: 5.0000 mL | Freq: Four times a day (QID) | ORAL | Status: DC

## 2012-08-13 MED ORDER — TRAZODONE HCL 50 MG PO TABS: 50.0000 mg | ORAL_TABLET | Freq: Every day | ORAL | Status: DC

## 2012-08-13 NOTE — Progress Notes (Signed)
  Subjective:    Patient ID: Kaitlyn Serrano, female    DOB: 05/17/1940, 72 y.o.   MRN: [id number]  Constipation This is a new problem. The current episode started 1 to 4 weeks ago (10 days). The problem is unchanged. Her stool frequency is 1 time per week or less. The stool is described as firm. The patient is on a high fiber diet. She exercises regularly. There has been adequate water intake. Associated symptoms include abdominal pain, back pain, bloating, flatus and nausea. Pertinent negatives include no vomiting. Risk factors: Had back surgery 06/09. She has tried enemas, fiber, laxatives, stool softeners, mineral oil, manual disimpaction and diet changes for the symptoms. The treatment provided no relief.  Back Pain Associated symptoms include abdominal pain.   * PAtien thad back surgery 08/03/12- has been on pain meds in the hospital and since she has been home. Hasn't had a bowel movement since 08/05/12.   Review of Systems  HENT: Positive for mouth sores.   Gastrointestinal: Positive for nausea, abdominal pain, constipation, abdominal distention, bloating and flatus. Negative for vomiting.  Musculoskeletal: Positive for back pain and gait problem.       Back surgery on June 9th        Objective:   Physical Exam  Constitutional: She is oriented to person, place, and time. She appears well-developed and well-nourished.  HENT:  Head: Normocephalic.  Neck: Normal range of motion. Neck supple. No thyromegaly present.  Cardiovascular: Normal rate, regular rhythm, normal heart sounds and intact distal pulses.   Pulmonary/Chest: Effort normal and breath sounds normal.  Abdominal: She exhibits distension. There is tenderness.  Distend firm abdomen with hyperactive BS  Musculoskeletal: Normal range of motion.  Neurological: She is alert and oriented to person, place, and time.  Skin: Skin is warm and dry.  Psychiatric: She has a normal mood and affect. Her behavior is normal. Judgment and  thought content normal.    KUB- Moderate amounts of stool in colon- Preliminary reading by Paulene Floor, FNP  [facility]      BP 126/69  Pulse 61  Temp(Src) 98 F (36.7 C) (Oral)  Ht 5\' 3"  (1.6 m)  Wt 169 lb (76.658 kg)  BMI 29.94 kg/m2  Assessment & Plan:   1. Constipation due to pain medication, initial encounter   2. Oral candidiasis   3. Unspecified constipation    Orders Placed This Encounter  Procedures  . DG Abd 1 View    Standing Status: Future     Number of Occurrences: 1     Standing Expiration Date: 10/13/2013    Order Specific Question:  Reason for Exam (SYMPTOM  OR DIAGNOSIS REQUIRED)    Answer:  constipation    Order Specific Question:  Preferred imaging location?    Answer:  Internal   [facility] magic mouthwash- 5ml swish and swallow QID- 150 ml [id number] 1 PO BID- #24 samples Prune juice Force fluids  Mary-Margaret Daphine Deutscher, FNP

## 2012-08-13 NOTE — Telephone Encounter (Signed)
appt made

## 2012-08-13 NOTE — Telephone Encounter (Signed)
Last seen 06/30/12

## 2012-08-13 NOTE — Patient Instructions (Signed)

## 2012-08-13 NOTE — Addendum Note (Signed)
Addended by: Bennie Pierini on: 08/13/2012 03:24 PM   Modules accepted: Orders

## 2012-08-17 ENCOUNTER — Emergency Department (HOSPITAL_COMMUNITY): Payer: Medicare Other

## 2012-08-17 ENCOUNTER — Encounter (HOSPITAL_COMMUNITY): Payer: Self-pay | Admitting: *Deleted

## 2012-08-17 ENCOUNTER — Telehealth: Payer: Self-pay | Admitting: Family Medicine

## 2012-08-17 ENCOUNTER — Emergency Department (HOSPITAL_COMMUNITY)
Admission: EM | Admit: 2012-08-17 | Discharge: 2012-08-17 | Disposition: A | Discharge status: Home / Self Care | Payer: Medicare Other | Attending: Emergency Medicine | Admitting: Emergency Medicine

## 2012-08-17 DIAGNOSIS — Z8679 Personal history of other diseases of the circulatory system: Secondary | ICD-10-CM | POA: Diagnosis not present

## 2012-08-17 DIAGNOSIS — I10 Essential (primary) hypertension: Secondary | ICD-10-CM | POA: Diagnosis not present

## 2012-08-17 DIAGNOSIS — Z9889 Other specified postprocedural states: Secondary | ICD-10-CM | POA: Insufficient documentation

## 2012-08-17 DIAGNOSIS — K5900 Constipation, unspecified: Secondary | ICD-10-CM

## 2012-08-17 DIAGNOSIS — Z8709 Personal history of other diseases of the respiratory system: Secondary | ICD-10-CM | POA: Diagnosis not present

## 2012-08-17 DIAGNOSIS — Z87891 Personal history of nicotine dependence: Secondary | ICD-10-CM | POA: Diagnosis not present

## 2012-08-17 DIAGNOSIS — E7800 Pure hypercholesterolemia, unspecified: Secondary | ICD-10-CM | POA: Insufficient documentation

## 2012-08-17 DIAGNOSIS — Z8719 Personal history of other diseases of the digestive system: Secondary | ICD-10-CM | POA: Insufficient documentation

## 2012-08-17 DIAGNOSIS — Z8669 Personal history of other diseases of the nervous system and sense organs: Secondary | ICD-10-CM | POA: Insufficient documentation

## 2012-08-17 DIAGNOSIS — G4700 Insomnia, unspecified: Secondary | ICD-10-CM | POA: Insufficient documentation

## 2012-08-17 DIAGNOSIS — Z880 Allergy status to penicillin: Secondary | ICD-10-CM | POA: Insufficient documentation

## 2012-08-17 DIAGNOSIS — R1084 Generalized abdominal pain: Secondary | ICD-10-CM | POA: Insufficient documentation

## 2012-08-17 DIAGNOSIS — Z79899 Other long term (current) drug therapy: Secondary | ICD-10-CM | POA: Insufficient documentation

## 2012-08-17 DIAGNOSIS — R109 Unspecified abdominal pain: Secondary | ICD-10-CM | POA: Diagnosis not present

## 2012-08-17 LAB — CBC WITH DIFFERENTIAL/PLATELET
Basophils Relative: 0 % (ref 0–1)
Eosinophils Absolute: 0 10*3/uL (ref 0.0–0.7)
HCT: 29.9 % — ABNORMAL LOW (ref 36.0–46.0)
Hemoglobin: 10.1 g/dL — ABNORMAL LOW (ref 12.0–15.0)
Lymphs Abs: 1.4 10*3/uL (ref 0.7–4.0)
MCH: 30.1 pg (ref 26.0–34.0)
MCHC: 33.8 g/dL (ref 30.0–36.0)
MCV: 89.3 fL (ref 78.0–100.0)
Monocytes Absolute: 0.9 10*3/uL (ref 0.1–1.0)
Monocytes Relative: 7 % (ref 3–12)
RBC: 3.35 MIL/uL — ABNORMAL LOW (ref 3.87–5.11)

## 2012-08-17 LAB — COMPREHENSIVE METABOLIC PANEL
Albumin: 3 g/dL — ABNORMAL LOW (ref 3.5–5.2)
Alkaline Phosphatase: 135 U/L — ABNORMAL HIGH (ref 39–117)
BUN: 7 mg/dL (ref 6–23)
Creatinine, Ser: 0.65 mg/dL (ref 0.50–1.10)
GFR calc Af Amer: >90 mL/min (ref >90)
Glucose, Bld: 115 mg/dL — ABNORMAL HIGH (ref 70–99)
Potassium: 3.3 mEq/L — ABNORMAL LOW (ref 3.5–5.1)
Total Bilirubin: 0.4 mg/dL (ref 0.3–1.2)
Total Protein: 6.8 g/dL (ref 6.0–8.3)

## 2012-08-17 LAB — LACTIC ACID, PLASMA: Lactic Acid, Venous: 1.4 mmol/L (ref 0.5–2.2)

## 2012-08-17 LAB — LIPASE, BLOOD: Lipase: 9 U/L — ABNORMAL LOW (ref 11–59)

## 2012-08-17 MED ORDER — MAGNESIUM CITRATE PO SOLN
1.0000 | Freq: Once | ORAL | Status: AC
  Administered 2012-08-17: 1 via ORAL
  Filled 2012-08-17: qty 296

## 2012-08-17 MED ORDER — LACTULOSE 10 GM/15ML PO SOLN: 20.0000 g | Freq: Two times a day (BID) | ORAL | Status: DC | PRN

## 2012-08-17 NOTE — ED Notes (Signed)
Pt alert & oriented x4, stable gait. Patient given discharge instructions, paperwork & prescription(s). Patient  instructed to stop at the registration desk to finish any additional paperwork. Patient verbalized understanding. Pt left department w/ no further questions. 

## 2012-08-17 NOTE — Telephone Encounter (Signed)
Will nedd to have impaction cleaned out if not getting better- HAve tried enema?

## 2012-08-17 NOTE — Telephone Encounter (Signed)
Pt aware ntbs   States that there is no poop- daughter is retired Engineer, civil (consulting)- disimpacted her - no poop, done 3 enemas today. Still last bm was 08/05/12.  Stomach swollen  Needs another kub or go to the

## 2012-08-17 NOTE — ED Provider Notes (Signed)
History  This chart was scribed for Kaitlyn Serrano, * by Kaitlyn Serrano, ED scribe. This patient was seen in room APA09/APA09 and the patient's care was started at 1838.  CSN: [id number] Arrival date & time 08/17/12  Kaitlyn Serrano  First MD Initiated Contact with Patient 08/17/12 1938     Chief Complaint  Patient presents with  . Constipation   The history is provided by the patient. No language interpreter was used.   HPI Comments: Kaitlyn Serrano is a 72 y.o. female who presents to the Emergency Department complaining of constipation after recent back surgery at [place] 6[place] and has not had BM since June 11th. She states associated diffuse abdominal pain/discomfort but denies emesis, fever, chills. She states taking half dosage of her post surgery medicines. She had an abdominal X-ray 2 days ago which showed large amounts of stool which she has still not passed.   Past Medical History  Diagnosis Date  . Sinus infection   . Hypertension   . High cholesterol   . Insomnia   . Blood transfusion   . GERD (gastroesophageal reflux disease)   . Hemorrhoids   . Glaucoma   . Diverticulosis    Past Surgical History  Procedure Laterality Date  . Cervical spine surgery    . Back surgery     Family History  Problem Relation Age of Onset  . Diabetes Mother   . Heart disease Mother   . Hypertension Mother   . Heart disease Father    History  Substance Use Topics  . Smoking status: Former Smoker -- 4.00 packs/day for 3 years    Types: Cigarettes    Quit date: 02/20/1964  . Smokeless tobacco: Former Neurosurgeon  . Alcohol Use: No   OB History   Grav Para Term Preterm Abortions TAB SAB Ect Mult Living   4 3 3  1   1  3      Review of Systems  Constitutional: Negative for fever and chills.  Respiratory: Negative for shortness of breath.   Gastrointestinal: Positive for abdominal pain and constipation. Negative for nausea and vomiting.  Neurological: Negative for weakness.  All other  systems reviewed and are negative.   A complete 10 system review of systems was obtained and all systems are negative except as noted in the HPI and PMH.   Allergies  Bee venom; Elavil; and Penicillins  Home Medications   Current Outpatient Rx  Name  Route  Sig  Dispense  Refill  . ALPRAZolam (XANAX) 0.5 MG tablet   Oral   Take 0.5 tablets (0.25 mg total) by mouth at bedtime as needed. For sleep   30 tablet   0   . Alum & Mag Hydroxide-Simeth (MAGIC MOUTHWASH W/LIDOCAINE) SOLN   Oral   Take 5 mLs by mouth 4 (four) times daily.   150 mL   0   . atenolol (TENORMIN) 50 MG tablet   Oral   Take 50 mg by mouth daily.         Marland Kitchen diltiazem (CARDIZEM) 120 MG tablet   Oral   Take 120 mg by mouth daily.          . furosemide (LASIX) 40 MG tablet      TAKE 1 TABLET ONCE A DAY   90 tablet   1   . lubiprostone (AMITIZA) 24 MCG capsule   Oral   Take 1 capsule (24 mcg total) by mouth 2 (two) times daily with a meal.  16 capsule   0   . oxyCODONE-acetaminophen (PERCOCET) 10-325 MG per tablet   Oral   Take 1 tablet by mouth every 4 (four) hours as needed for pain.         . simvastatin (ZOCOR) 20 MG tablet   Oral   Take 20 mg by mouth at bedtime.           . traZODone (DESYREL) 50 MG tablet   Oral   Take 1 tablet (50 mg total) by mouth at bedtime.   30 tablet   0    Triage Vitals: BP 169/63  Pulse 87  Temp(Src) 97.7 F (36.5 C) (Oral)  Resp 21  Ht 5\' 3"  (1.6 m)  Wt 169 lb (76.658 kg)  BMI 29.94 kg/m2  SpO2 97% Physical Exam  Nursing note and vitals reviewed. Constitutional: She is oriented to person, place, and time. She appears well-developed and well-nourished. No distress.  HENT:  Head: Normocephalic and atraumatic.  Right Ear: Hearing normal.  Left Ear: Hearing normal.  Nose: Nose normal.  Mouth/Throat: Oropharynx is clear and moist and mucous membranes are normal.  Eyes: Conjunctivae and EOM are normal. Pupils are equal, round, and reactive to  light.  Neck: Normal range of motion. Neck supple.  Cardiovascular: Regular rhythm, S1 normal and S2 normal.  Exam reveals no gallop and no friction rub.   No murmur heard. Pulmonary/Chest: Effort normal and breath sounds normal. No respiratory distress. She exhibits no tenderness.  Abdominal: Soft. Normal appearance and bowel sounds are normal. There is no hepatosplenomegaly. There is no tenderness. There is no rebound, no guarding, no tenderness at McBurney's point and negative Murphy's sign. No hernia.  Musculoskeletal: Normal range of motion.  Neurological: She is alert and oriented to person, place, and time. She has normal strength. No cranial nerve deficit or sensory deficit. Coordination normal. GCS eye subscore is 4. GCS verbal subscore is 5. GCS motor subscore is 6.  Skin: Skin is warm, dry and intact. No rash noted. No cyanosis.  Psychiatric: She has a normal mood and affect. Her speech is normal and behavior is normal. Thought content normal.    ED Course  Procedures (including critical care time) DIAGNOSTIC STUDIES: Oxygen Saturation is 97% on room air, normal by my interpretation.    COORDINATION OF CARE: At 735 PM Discussed treatment plan with patient which includes abdominal X-ray, blood work. Patient agrees.   Labs Reviewed  CBC WITH DIFFERENTIAL - Abnormal; Notable for the following:    WBC 13.1 (*)    RBC 3.35 (*)    Hemoglobin 10.1 (*)    HCT 29.9 (*)    Platelets 518 (*)    Neutrophils Relative % 82 (*)    Neutro Abs 10.8 (*)    Lymphocytes Relative 11 (*)    All other components within normal limits  COMPREHENSIVE METABOLIC PANEL - Abnormal; Notable for the following:    Potassium 3.3 (*)    Glucose, Bld 115 (*)    Albumin 3.0 (*)    Alkaline Phosphatase 135 (*)    GFR calc non Af Amer 87 (*)    All other components within normal limits  LIPASE, BLOOD - Abnormal; Notable for the following:    Lipase 9 (*)    All other components within normal limits   LACTIC ACID, PLASMA   Dg Abd Acute W/chest  08/17/2012   *RADIOLOGY REPORT*  Clinical Data: Abdominal pain and distention for several days, former smoking history  ACUTE ABDOMEN  SERIES (ABDOMEN 2 VIEW & CHEST 1 VIEW)  Comparison: Chest x-ray of 06/06/2011 and abdomen film of 08/13/2012  Findings: No active infiltrate or effusion is seen.  Mediastinal contours are stable.  The heart is mildly enlarged and stable. Hardware for anterior and posterior lower cervical spine fusion and posterior fusion of the lower thoracic - upper lumbar spine is noted.  Hardware for extensive fusion from T10-S1 is noted with no apparent change in alignment.  A moderate amount of feces is noted throughout the colon.  No bowel obstruction is seen and no free air is evident.  IMPRESSION:  1.  No active infiltrate or effusion. 2.  Moderate amount of feces throughout the colon.  No bowel obstruction.  No free air.   Original Report Authenticated By: Dwyane Dee, M.D.   Diagnosis: Abdominal pain secondary to constipation  MDM  Presents to ER for evaluation of increasing abdominal pain. Patient had lumbar surgery on June 9. She has not had a bowel movement since June 12. She is taking oxycodone for pain after surgery. She has signs of acute surgical process.been taking decreased doses, but is still taking it. I believe this is the cause of her constipation. She did have abdominal distention with diffuse tenderness but noSign of acute surgical process. Blood work was unremarkable. X-ray did confirm large amount of stool in the colon. Patient was administered magnesium citrate and enema, had bowel movement with relief of her symptoms. She'll be discharged, continue treatment with lactulose as well as Colace. Follow up primary Doctor this week. Return if symptoms worsen.  I personally performed the services described in this documentation, which was scribed in my presence. The recorded information has been reviewed and is  accurate.    Kaitlyn Crease, MD 08/17/12 [id number]

## 2012-08-17 NOTE — ED Notes (Signed)
Pt given mineral oil enema.

## 2012-08-17 NOTE — ED Notes (Signed)
No answer

## 2012-08-17 NOTE — Telephone Encounter (Signed)
Mmm, pt still has no relief from contipation med- please advise

## 2012-08-17 NOTE — ED Notes (Signed)
No BM since 6/12  Had  Back  surgery At [facility]   6/9.

## 2012-08-17 NOTE — ED Notes (Signed)
Pt had BM after enema 

## 2012-08-26 ENCOUNTER — Other Ambulatory Visit: Payer: Self-pay | Admitting: Nurse Practitioner

## 2012-08-27 ENCOUNTER — Telehealth: Payer: Self-pay | Admitting: Nurse Practitioner

## 2012-08-29 ENCOUNTER — Other Ambulatory Visit: Payer: Self-pay | Admitting: Nurse Practitioner

## 2012-09-01 DIAGNOSIS — M4120 Other idiopathic scoliosis, site unspecified: Secondary | ICD-10-CM | POA: Diagnosis not present

## 2012-09-01 DIAGNOSIS — M961 Postlaminectomy syndrome, not elsewhere classified: Secondary | ICD-10-CM | POA: Diagnosis not present

## 2012-09-03 ENCOUNTER — Telehealth: Payer: Self-pay | Admitting: Nurse Practitioner

## 2012-09-03 MED ORDER — PROMETHAZINE HCL 12.5 MG PO TABS: 12.5000 mg | ORAL_TABLET | Freq: Three times a day (TID) | ORAL | Status: DC | PRN

## 2012-09-03 NOTE — Telephone Encounter (Signed)
Phenergan rx sent to pharmacy 

## 2012-09-03 NOTE — Telephone Encounter (Signed)
Patient made aware that medication has been sent to [facility].  Made her aware of its sedative effects.  She would like to come in and see Gennette Pac, FNP and will make an appointment.

## 2012-09-03 NOTE — Telephone Encounter (Signed)
Patient was seen about 1 week ago for constipation.  She has had bowel movements since then but continues to have nausea.  No vomiting.  She spoke with her surgeon about the continued nausea and he instructed her to discontinue her pain meds.  Since the nausea hasn't improved he instructed her to f/u with her PCP.  Tried to schedule an appt for her to come in but she doesn't feel like coming in and wants to know if we can call in something for nausea.

## 2012-09-04 ENCOUNTER — Encounter: Payer: Self-pay | Admitting: Family Medicine

## 2012-09-04 ENCOUNTER — Ambulatory Visit (INDEPENDENT_AMBULATORY_CARE_PROVIDER_SITE_OTHER): Payer: Medicare Other | Admitting: Family Medicine

## 2012-09-04 ENCOUNTER — Telehealth: Payer: Self-pay | Admitting: Nurse Practitioner

## 2012-09-04 VITALS — BP 124/79 | HR 76 | Temp 98.7°F | Wt 163.0 lb

## 2012-09-04 DIAGNOSIS — K5732 Diverticulitis of large intestine without perforation or abscess without bleeding: Secondary | ICD-10-CM | POA: Diagnosis not present

## 2012-09-04 DIAGNOSIS — R109 Unspecified abdominal pain: Secondary | ICD-10-CM

## 2012-09-04 MED ORDER — METRONIDAZOLE 500 MG PO TABS: ORAL_TABLET | ORAL | Status: DC

## 2012-09-04 MED ORDER — CIPROFLOXACIN HCL 500 MG PO TABS: 500.0000 mg | ORAL_TABLET | Freq: Two times a day (BID) | ORAL | Status: DC

## 2012-09-04 NOTE — Patient Instructions (Signed)
Diverticulitis °A diverticulum is a small pouch or sac on the colon. Diverticulosis is the presence of these diverticula on the colon. Diverticulitis is the irritation (inflammation) or infection of diverticula. °CAUSES  °The colon and its diverticula contain bacteria. If food particles block the tiny opening to a diverticulum, the bacteria inside can grow and cause an increase in pressure. This leads to infection and inflammation and is called diverticulitis. °SYMPTOMS  °· Abdominal pain and tenderness. Usually, the pain is located on the left side of your abdomen. However, it could be located elsewhere. °· Fever. °· Bloating. °· Feeling sick to your stomach (nausea). °· Throwing up (vomiting). °· Abnormal stools. °DIAGNOSIS  °Your caregiver will take a history and perform a physical exam. Since many things can cause abdominal pain, other tests may be necessary. Tests may include: °· Blood tests. °· Urine tests. °· X-ray of the abdomen. °· CT scan of the abdomen. °Sometimes, surgery is needed to determine if diverticulitis or other conditions are causing your symptoms. °TREATMENT  °Most of the time, you can be treated without surgery. Treatment includes: °· Resting the bowels by only having liquids for a few days. As you improve, you will need to eat a low-fiber diet. °· Intravenous (IV) fluids if you are losing body fluids (dehydrated). °· Antibiotic medicines that treat infections may be given. °· Pain and nausea medicine, if needed. °· Surgery if the inflamed diverticulum has burst. °HOME CARE INSTRUCTIONS  °· Try a clear liquid diet (broth, tea, or water for as long as directed by your caregiver). You may then gradually begin a low-fiber diet as tolerated.  °A low-fiber diet is a diet with less than 10 grams of fiber. Choose the foods below to reduce fiber in the diet: °· White breads, cereals, rice, and pasta. °· Cooked fruits and vegetables or soft fresh fruits and vegetables without the skin. °· Ground or  well-cooked tender beef, ham, veal, lamb, pork, or poultry. °· Eggs and seafood. °· After your diverticulitis symptoms have improved, your caregiver may put you on a high-fiber diet. A high-fiber diet includes 14 grams of fiber for every 1000 calories consumed. For a standard 2000 calorie diet, you would need 28 grams of fiber. Follow these diet guidelines to help you increase the fiber in your diet. It is important to slowly increase the amount fiber in your diet to avoid gas, constipation, and bloating. °· Choose whole-grain breads, cereals, pasta, and brown rice. °· Choose fresh fruits and vegetables with the skin on. Do not overcook vegetables because the more vegetables are cooked, the more fiber is lost. °· Choose more nuts, seeds, legumes, dried peas, beans, and lentils. °· Look for food products that have greater than 3 grams of fiber per serving on the Nutrition Facts label. °· Take all medicine as directed by your caregiver. °· If your caregiver has given you a follow-up appointment, it is very important that you go. Not going could result in lasting (chronic) or permanent injury, pain, and disability. If there is any problem keeping the appointment, call to reschedule. °SEEK MEDICAL CARE IF:  °· Your pain does not improve. °· You have a hard time advancing your diet beyond clear liquids. °· Your bowel movements do not return to normal. °SEEK IMMEDIATE MEDICAL CARE IF:  °· Your pain becomes worse. °· You have an oral temperature above 102° F (38.9° C), not controlled by medicine. °· You have repeated vomiting. °· You have bloody or black, tarry stools. °·   Symptoms that brought you to your caregiver become worse or are not getting better. °MAKE SURE YOU:  °· Understand these instructions. °· Will watch your condition. °· Will get help right away if you are not doing well or get worse. °Document Released: 11/21/2004 Document Revised: 05/06/2011 Document Reviewed: 03/19/2010 °Exit[employer]® Patient Information  ©2014 [employer]. ° °

## 2012-09-04 NOTE — Progress Notes (Signed)
  Subjective:    Patient ID: Kaitlyn Serrano, female    DOB: 07-15-1940, 72 y.o.   MRN: [id number]  HPI This 72 y.o. female presents for evaluation of abdominal pain and discomfort.  She has hx of diverticulosis And has had bouts of diverticulitis in the past. She has recently had back surgery and has been rx'd narcotic Pain medications and has been constipated.  She did have to go back to [facility] ED for enema after DC From hospital.  She has been having abdominal distention, constipation, and now has developed left lower and  Upper abdominal pain and is feeling sick.  She does get nauseated on occasion.   Review of Systems C/o nausea, abdominal pain, constipation, and weakness. No chest pain, SOB, HA, dizziness, vision change,  diarrhea,  dysuria, urinary urgency or frequency, myalgias, arthralgias or rash.     Objective:   Physical Exam Vital signs noted  Well developed well nourished female.  HEENT - Head atraumatic Normocephalic                Eyes - PERRLA, Conjuctiva - clear Sclera- Clear EOMI             Respiratory - Lungs CTA bilateral Cardiac - RRR S1 and S2 without murmur GI - Abdomen distended, tender Luq, and LLQ, abdomen tympanic and bowel sounds hypo x 4.       Assessment & Plan:  Diverticulitis of colon (without mention of hemorrhage) - Plan: ciprofloxacin (CIPRO) 500 MG tablet, metroNIDAZOLE (FLAGYL) 500 MG tablet If not better then follow up prn  Abdominal  pain, other specified site - Plan: ciprofloxacin (CIPRO) 500 MG tablet, metroNIDAZOLE (FLAGYL) 500 MG tablet.    Constipation - Linzess po qd #12 samples.  Push po fluids and follow up prn if not better.

## 2012-09-04 NOTE — Telephone Encounter (Signed)
Appt given for today 

## 2012-09-16 ENCOUNTER — Other Ambulatory Visit: Payer: Self-pay | Admitting: Nurse Practitioner

## 2012-09-18 NOTE — Telephone Encounter (Signed)
Last filled 08/13/12, call into [facility] if approved (MMM)

## 2012-09-22 NOTE — Telephone Encounter (Signed)
Called into pharmacy

## 2012-09-25 ENCOUNTER — Other Ambulatory Visit: Payer: Self-pay

## 2012-09-28 ENCOUNTER — Other Ambulatory Visit: Payer: Self-pay | Admitting: *Deleted

## 2012-09-28 ENCOUNTER — Other Ambulatory Visit: Payer: Self-pay | Admitting: Family Medicine

## 2012-09-28 MED ORDER — SIMVASTATIN 20 MG PO TABS: 20.0000 mg | ORAL_TABLET | Freq: Every day | ORAL | Status: DC

## 2012-09-28 NOTE — Telephone Encounter (Signed)
Pt was just seen last week, but hasnt had labs since 08/13.

## 2012-09-28 NOTE — Telephone Encounter (Signed)
Simvastatin sent in. 

## 2012-09-29 NOTE — Telephone Encounter (Signed)
[facility] notified and they confirmed did get rx for simivastatin . Spoke to Stu.  Gh rn

## 2012-10-12 ENCOUNTER — Encounter: Payer: Medicare Other | Admitting: Women's Health

## 2012-10-29 ENCOUNTER — Ambulatory Visit (INDEPENDENT_AMBULATORY_CARE_PROVIDER_SITE_OTHER): Payer: Medicare Other | Admitting: Family Medicine

## 2012-10-29 ENCOUNTER — Encounter: Payer: Self-pay | Admitting: Family Medicine

## 2012-10-29 VITALS — BP 124/81 | HR 87 | Temp 97.4°F | Wt 165.4 lb

## 2012-10-29 DIAGNOSIS — K589 Irritable bowel syndrome without diarrhea: Secondary | ICD-10-CM

## 2012-10-29 DIAGNOSIS — E785 Hyperlipidemia, unspecified: Secondary | ICD-10-CM | POA: Diagnosis not present

## 2012-10-29 DIAGNOSIS — G4700 Insomnia, unspecified: Secondary | ICD-10-CM

## 2012-10-29 DIAGNOSIS — F411 Generalized anxiety disorder: Secondary | ICD-10-CM

## 2012-10-29 DIAGNOSIS — J209 Acute bronchitis, unspecified: Secondary | ICD-10-CM

## 2012-10-29 DIAGNOSIS — I10 Essential (primary) hypertension: Secondary | ICD-10-CM | POA: Diagnosis not present

## 2012-10-29 LAB — POCT CBC
Granulocyte percent: 77.1 %G (ref 37–80)
HCT, POC: 39.9 % (ref 37.7–47.9)
Hemoglobin: 13.1 g/dL (ref 12.2–16.2)
Lymph, poc: 1.5 (ref 0.6–3.4)
MCH, POC: 26 pg — AB (ref 27–31.2)
MCHC: 32.8 g/dL (ref 31.8–35.4)
MCV: 79.5 fL — AB (ref 80–97)
MPV: 8.2 fL (ref 0–99.8)
POC Granulocyte: 6.6 (ref 2–6.9)
POC LYMPH PERCENT: 18.2 %L (ref 10–50)
Platelet Count, POC: 236 10*3/uL (ref 142–424)
RBC: 5 M/uL (ref 4.04–5.48)
RDW, POC: 15.7 %
WBC: 8.5 10*3/uL (ref 4.6–10.2)

## 2012-10-29 MED ORDER — ALPRAZOLAM 0.5 MG PO TABS: 0.1250 mg | ORAL_TABLET | Freq: Two times a day (BID) | ORAL | Status: DC | PRN

## 2012-10-29 MED ORDER — ATENOLOL 50 MG PO TABS: 50.0000 mg | ORAL_TABLET | Freq: Every day | ORAL | Status: DC

## 2012-10-29 MED ORDER — SIMVASTATIN 20 MG PO TABS: 20.0000 mg | ORAL_TABLET | Freq: Every day | ORAL | Status: DC

## 2012-10-29 MED ORDER — TRAZODONE HCL 50 MG PO TABS: 50.0000 mg | ORAL_TABLET | Freq: Every day | ORAL | Status: DC

## 2012-10-29 MED ORDER — BENZONATATE 100 MG PO CAPS: 100.0000 mg | ORAL_CAPSULE | Freq: Three times a day (TID) | ORAL | Status: DC | PRN

## 2012-10-29 MED ORDER — LUBIPROSTONE 24 MCG PO CAPS: 24.0000 ug | ORAL_CAPSULE | Freq: Two times a day (BID) | ORAL | Status: DC

## 2012-10-29 MED ORDER — AZITHROMYCIN 250 MG PO TABS: ORAL_TABLET | ORAL | Status: DC

## 2012-10-29 MED ORDER — FUROSEMIDE 40 MG PO TABS: 40.0000 mg | ORAL_TABLET | Freq: Every day | ORAL | Status: DC

## 2012-10-29 MED ORDER — DILTIAZEM HCL 120 MG PO TABS: 120.0000 mg | ORAL_TABLET | Freq: Every day | ORAL | Status: DC

## 2012-10-29 NOTE — Patient Instructions (Signed)

## 2012-10-29 NOTE — Progress Notes (Signed)
  Subjective:    Patient ID: Kaitlyn Serrano, female    DOB: 1940-03-04, 72 y.o.   MRN: [id number]  HPI This 72 y.o. female presents for evaluation of cough and congestion for over a week. Patient also needs refills on some of her medications.   Review of Systems C/o cough No chest pain, SOB, HA, dizziness, vision change, N/V, diarrhea, constipation, dysuria, urinary urgency or frequency, myalgias, arthralgias or rash.     Objective:   Physical Exam  Vital signs noted  Well developed well nourished female.  HEENT - Head atraumatic Normocephalic                Eyes - PERRLA, Conjuctiva - clear Sclera- Clear EOMI                Ears - EAC's Wnl TM's Wnl Gross Hearing WNL                Nose - Nares patent                 Throat - oropharanx wnl Respiratory - Lungs CTA bilateral Cardiac - RRR S1 and S2 without murmur      Assessment & Plan:  Acute bronchitis - Plan: azithromycin (ZITHROMAX) 250 MG tablet, benzonatate (TESSALON PERLES) 100 MG capsule  Insomnia - Plan: traZODone (DESYREL) 50 MG tablet  Other and unspecified hyperlipidemia - Plan: simvastatin (ZOCOR) 20 MG tablet  IBS (irritable bowel syndrome) - Plan: lubiprostone (AMITIZA) 24 MCG capsule  Essential hypertension, benign - Plan: furosemide (LASIX) 40 MG tablet, diltiazem (CARDIZEM) 120 MG tablet, atenolol (TENORMIN) 50 MG tablet, POCT CBC, CMP14+EGFR  Anxiety state, unspecified - Plan: ALPRAZolam (XANAX) 0.5 MG tablet

## 2012-10-30 LAB — CMP14+EGFR
ALT: 12 IU/L (ref 0–32)
AST: 19 IU/L (ref 0–40)
Albumin/Globulin Ratio: 1.7 (ref 1.1–2.5)
Albumin: 4.3 g/dL (ref 3.5–4.8)
Alkaline Phosphatase: 112 IU/L (ref 39–117)
BUN/Creatinine Ratio: 15 (ref 11–26)
BUN: 10 mg/dL (ref 8–27)
CO2: 26 mmol/L (ref 18–29)
Calcium: 9.8 mg/dL (ref 8.6–10.2)
Chloride: 99 mmol/L (ref 97–108)
Creatinine, Ser: 0.65 mg/dL (ref 0.57–1.00)
GFR calc Af Amer: 103 mL/min/{1.73_m2} (ref >59)
GFR calc non Af Amer: 89 mL/min/{1.73_m2} (ref >59)
Globulin, Total: 2.6 g/dL (ref 1.5–4.5)
Glucose: 106 mg/dL — ABNORMAL HIGH (ref 65–99)
Potassium: 4.6 mmol/L (ref 3.5–5.2)
Sodium: 141 mmol/L (ref 134–144)
Total Bilirubin: 0.6 mg/dL (ref 0.0–1.2)
Total Protein: 6.9 g/dL (ref 6.0–8.5)

## 2012-11-04 DIAGNOSIS — M4716 Other spondylosis with myelopathy, lumbar region: Secondary | ICD-10-CM | POA: Diagnosis not present

## 2012-11-04 DIAGNOSIS — T84498A Other mechanical complication of other internal orthopedic devices, implants and grafts, initial encounter: Secondary | ICD-10-CM | POA: Diagnosis not present

## 2012-11-04 DIAGNOSIS — M961 Postlaminectomy syndrome, not elsewhere classified: Secondary | ICD-10-CM | POA: Diagnosis not present

## 2012-11-04 DIAGNOSIS — M4120 Other idiopathic scoliosis, site unspecified: Secondary | ICD-10-CM | POA: Diagnosis not present

## 2012-11-09 ENCOUNTER — Other Ambulatory Visit: Payer: Self-pay | Admitting: Nurse Practitioner

## 2012-11-09 DIAGNOSIS — Z23 Encounter for immunization: Secondary | ICD-10-CM | POA: Diagnosis not present

## 2012-11-10 NOTE — Progress Notes (Signed)
Xanax and trazodone called into [facility]

## 2012-12-02 ENCOUNTER — Other Ambulatory Visit: Payer: Self-pay | Admitting: Nurse Practitioner

## 2012-12-04 NOTE — Telephone Encounter (Signed)
i do not see this on her med list?

## 2012-12-08 ENCOUNTER — Other Ambulatory Visit: Payer: Self-pay

## 2012-12-08 MED ORDER — PANTOPRAZOLE SODIUM 40 MG PO TBEC: 40.0000 mg | DELAYED_RELEASE_TABLET | Freq: Every day | ORAL | Status: DC

## 2012-12-08 NOTE — Telephone Encounter (Signed)
Last seen 10/29/12  [facility] [place]  This med not on EPIC list

## 2012-12-15 DIAGNOSIS — M4716 Other spondylosis with myelopathy, lumbar region: Secondary | ICD-10-CM | POA: Diagnosis not present

## 2012-12-15 DIAGNOSIS — M4120 Other idiopathic scoliosis, site unspecified: Secondary | ICD-10-CM | POA: Diagnosis not present

## 2012-12-15 DIAGNOSIS — M961 Postlaminectomy syndrome, not elsewhere classified: Secondary | ICD-10-CM | POA: Diagnosis not present

## 2012-12-15 DIAGNOSIS — T84498A Other mechanical complication of other internal orthopedic devices, implants and grafts, initial encounter: Secondary | ICD-10-CM | POA: Diagnosis not present

## 2013-01-12 DIAGNOSIS — T84498A Other mechanical complication of other internal orthopedic devices, implants and grafts, initial encounter: Secondary | ICD-10-CM | POA: Diagnosis not present

## 2013-01-12 DIAGNOSIS — M4716 Other spondylosis with myelopathy, lumbar region: Secondary | ICD-10-CM | POA: Diagnosis not present

## 2013-01-12 DIAGNOSIS — M961 Postlaminectomy syndrome, not elsewhere classified: Secondary | ICD-10-CM | POA: Diagnosis not present

## 2013-01-12 DIAGNOSIS — M4120 Other idiopathic scoliosis, site unspecified: Secondary | ICD-10-CM | POA: Diagnosis not present

## 2013-01-25 ENCOUNTER — Ambulatory Visit: Payer: Medicare Other | Attending: Orthopaedic Surgery | Admitting: Physical Therapy

## 2013-01-25 DIAGNOSIS — M545 Low back pain, unspecified: Secondary | ICD-10-CM | POA: Diagnosis not present

## 2013-01-25 DIAGNOSIS — IMO0001 Reserved for inherently not codable concepts without codable children: Secondary | ICD-10-CM | POA: Insufficient documentation

## 2013-01-25 DIAGNOSIS — R5381 Other malaise: Secondary | ICD-10-CM | POA: Diagnosis not present

## 2013-01-25 DIAGNOSIS — M2560 Stiffness of unspecified joint, not elsewhere classified: Secondary | ICD-10-CM | POA: Diagnosis not present

## 2013-01-27 ENCOUNTER — Ambulatory Visit: Payer: Medicare Other | Admitting: *Deleted

## 2013-01-29 ENCOUNTER — Ambulatory Visit: Payer: Medicare Other | Admitting: *Deleted

## 2013-02-02 ENCOUNTER — Ambulatory Visit: Payer: Medicare Other | Admitting: Physical Therapy

## 2013-02-03 ENCOUNTER — Ambulatory Visit: Payer: Medicare Other | Admitting: Physical Therapy

## 2013-02-05 ENCOUNTER — Ambulatory Visit: Payer: Medicare Other | Admitting: Physical Therapy

## 2013-02-08 ENCOUNTER — Ambulatory Visit: Payer: Medicare Other | Admitting: Physical Therapy

## 2013-02-09 ENCOUNTER — Encounter: Payer: Medicare Other | Admitting: Physical Therapy

## 2013-02-12 ENCOUNTER — Ambulatory Visit: Payer: Medicare Other | Admitting: *Deleted

## 2013-02-15 ENCOUNTER — Encounter: Payer: Medicare Other | Admitting: Physical Therapy

## 2013-02-22 ENCOUNTER — Encounter: Payer: Self-pay | Admitting: Family Medicine

## 2013-02-22 ENCOUNTER — Ambulatory Visit: Payer: Medicare Other | Admitting: Physical Therapy

## 2013-02-22 ENCOUNTER — Ambulatory Visit (INDEPENDENT_AMBULATORY_CARE_PROVIDER_SITE_OTHER): Payer: Medicare Other | Admitting: Family Medicine

## 2013-02-22 VITALS — BP 128/83 | HR 67 | Temp 96.8°F | Ht 63.0 in | Wt 166.0 lb

## 2013-02-22 DIAGNOSIS — R350 Frequency of micturition: Secondary | ICD-10-CM

## 2013-02-22 DIAGNOSIS — E785 Hyperlipidemia, unspecified: Secondary | ICD-10-CM

## 2013-02-22 DIAGNOSIS — F411 Generalized anxiety disorder: Secondary | ICD-10-CM

## 2013-02-22 DIAGNOSIS — K219 Gastro-esophageal reflux disease without esophagitis: Secondary | ICD-10-CM

## 2013-02-22 DIAGNOSIS — I10 Essential (primary) hypertension: Secondary | ICD-10-CM

## 2013-02-22 DIAGNOSIS — G4700 Insomnia, unspecified: Secondary | ICD-10-CM

## 2013-02-22 LAB — POCT URINALYSIS DIPSTICK
Bilirubin, UA: NEGATIVE
Blood, UA: NEGATIVE
Glucose, UA: NEGATIVE
Ketones, UA: NEGATIVE
Leukocytes, UA: NEGATIVE
Nitrite, UA: NEGATIVE
Protein, UA: NEGATIVE
Spec Grav, UA: 1.01
Urobilinogen, UA: NEGATIVE
pH, UA: 6.5

## 2013-02-22 MED ORDER — TRAZODONE HCL 50 MG PO TABS: ORAL_TABLET | ORAL | Status: DC

## 2013-02-22 MED ORDER — PANTOPRAZOLE SODIUM 40 MG PO TBEC: 40.0000 mg | DELAYED_RELEASE_TABLET | Freq: Two times a day (BID) | ORAL | Status: DC

## 2013-02-22 MED ORDER — ALPRAZOLAM 0.5 MG PO TABS: 0.1250 mg | ORAL_TABLET | Freq: Two times a day (BID) | ORAL | Status: DC | PRN

## 2013-02-22 MED ORDER — SIMVASTATIN 20 MG PO TABS: 20.0000 mg | ORAL_TABLET | Freq: Every day | ORAL | Status: DC

## 2013-02-22 MED ORDER — FUROSEMIDE 40 MG PO TABS: 40.0000 mg | ORAL_TABLET | Freq: Every day | ORAL | Status: DC

## 2013-02-22 MED ORDER — DILTIAZEM HCL 120 MG PO TABS: 120.0000 mg | ORAL_TABLET | Freq: Every day | ORAL | Status: DC

## 2013-02-22 NOTE — Progress Notes (Signed)
   Subjective:    Patient ID: Kaitlyn Serrano, female    DOB: 1940-05-15, 72 y.o.   MRN: [id number]  HPI This 72 y.o. female presents for evaluation of GERD sx's and needing refills on protonix.  She has  Hx of urethral stricture and has had to undergo urethral stretching procedure at urology a few years ago and she thinks it has returned.  She needs refills on her medicine.  She is due for labs.  She has Insomnia and the trazadone 50mg  po qhs is not working anymore.   Review of Systems C/o urinary frequency and GERD.   No chest pain, SOB, HA, dizziness, vision change, N/V, diarrhea, constipation, dysuria, urinary urgency or frequency, myalgias, arthralgias or rash.   Objective:   Physical Exam  Vital signs noted  Well developed well nourished female.  HEENT - Head atraumatic Normocephalic                Eyes - PERRLA, Conjuctiva - clear Sclera- Clear EOMI                Ears - EAC's Wnl TM's Wnl Gross Hearing WNL                Nose - Nares patent                 Throat - oropharanx wnl Respiratory - Lungs CTA bilateral Cardiac - RRR S1 and S2 without murmur GI - Abdomen soft Nontender and bowel sounds active x 4 Extremities - No edema. Neuro - Grossly intact.  Results for orders placed in visit on 02/22/13  POCT URINALYSIS DIPSTICK      Result Value Range   Color, UA yellow     Clarity, UA clear     Glucose, UA neg     Bilirubin, UA neg     Ketones, UA neg     Spec Grav, UA 1.010     Blood, UA neg     pH, UA 6.5     Protein, UA neg     Urobilinogen, UA negative     Nitrite, UA neg     Leukocytes, UA Negative        Assessment & Plan:  GERD (gastroesophageal reflux disease) - Plan: pantoprazole (PROTONIX) 40 MG tablet  Urinary frequency - Plan: POCT urinalysis dipstick, Ambulatory referral to Urology, CANCELED: POCT UA - Microscopic Only  Other and unspecified hyperlipidemia - Plan: simvastatin (ZOCOR) 20 MG tablet, Lipid panel  Insomnia - Plan: traZODone  (DESYREL) 50 MG tablet, DISCONTINUED: traZODone (DESYREL) 50 MG tablet, DISCONTINUED: traZODone (DESYREL) 50 MG tablet  Anxiety state, unspecified - Plan: ALPRAZolam (XANAX) 0.5 MG tablet, DISCONTINUED: ALPRAZolam (XANAX) 0.5 MG tablet  Essential hypertension, benign - Plan: furosemide (LASIX) 40 MG tablet, diltiazem (CARDIZEM) 120 MG tablet, POCT CBC, CMP14+EGFR  Deatra Canter FNP

## 2013-02-23 ENCOUNTER — Other Ambulatory Visit: Payer: Self-pay | Admitting: Family Medicine

## 2013-02-23 ENCOUNTER — Encounter: Payer: Medicare Other | Admitting: Physical Therapy

## 2013-02-23 ENCOUNTER — Telehealth: Payer: Self-pay | Admitting: Family Medicine

## 2013-02-23 NOTE — Telephone Encounter (Signed)
Please change med to cd in epic

## 2013-02-23 NOTE — Telephone Encounter (Signed)
That is the dose of cardizem in the med section

## 2013-03-01 DIAGNOSIS — L821 Other seborrheic keratosis: Secondary | ICD-10-CM | POA: Diagnosis not present

## 2013-03-01 DIAGNOSIS — L570 Actinic keratosis: Secondary | ICD-10-CM | POA: Diagnosis not present

## 2013-03-01 DIAGNOSIS — D235 Other benign neoplasm of skin of trunk: Secondary | ICD-10-CM | POA: Diagnosis not present

## 2013-03-01 DIAGNOSIS — D485 Neoplasm of uncertain behavior of skin: Secondary | ICD-10-CM | POA: Diagnosis not present

## 2013-03-01 DIAGNOSIS — L820 Inflamed seborrheic keratosis: Secondary | ICD-10-CM | POA: Diagnosis not present

## 2013-03-08 DIAGNOSIS — H40059 Ocular hypertension, unspecified eye: Secondary | ICD-10-CM | POA: Diagnosis not present

## 2013-03-08 DIAGNOSIS — H04129 Dry eye syndrome of unspecified lacrimal gland: Secondary | ICD-10-CM | POA: Diagnosis not present

## 2013-03-08 DIAGNOSIS — H4011X0 Primary open-angle glaucoma, stage unspecified: Secondary | ICD-10-CM | POA: Diagnosis not present

## 2013-03-08 DIAGNOSIS — H01029 Squamous blepharitis unspecified eye, unspecified eyelid: Secondary | ICD-10-CM | POA: Diagnosis not present

## 2013-03-08 DIAGNOSIS — H26499 Other secondary cataract, unspecified eye: Secondary | ICD-10-CM | POA: Diagnosis not present

## 2013-03-08 DIAGNOSIS — H43819 Vitreous degeneration, unspecified eye: Secondary | ICD-10-CM | POA: Diagnosis not present

## 2013-03-08 DIAGNOSIS — H31099 Other chorioretinal scars, unspecified eye: Secondary | ICD-10-CM | POA: Diagnosis not present

## 2013-03-08 DIAGNOSIS — Z961 Presence of intraocular lens: Secondary | ICD-10-CM | POA: Diagnosis not present

## 2013-03-11 DIAGNOSIS — N3943 Post-void dribbling: Secondary | ICD-10-CM | POA: Diagnosis not present

## 2013-03-11 DIAGNOSIS — R351 Nocturia: Secondary | ICD-10-CM | POA: Diagnosis not present

## 2013-03-11 DIAGNOSIS — R350 Frequency of micturition: Secondary | ICD-10-CM | POA: Diagnosis not present

## 2013-03-18 DIAGNOSIS — IMO0001 Reserved for inherently not codable concepts without codable children: Secondary | ICD-10-CM | POA: Diagnosis not present

## 2013-03-18 DIAGNOSIS — M5106 Intervertebral disc disorders with myelopathy, lumbar region: Secondary | ICD-10-CM | POA: Diagnosis not present

## 2013-03-18 DIAGNOSIS — G894 Chronic pain syndrome: Secondary | ICD-10-CM | POA: Diagnosis not present

## 2013-03-18 DIAGNOSIS — M4716 Other spondylosis with myelopathy, lumbar region: Secondary | ICD-10-CM | POA: Diagnosis not present

## 2013-04-08 ENCOUNTER — Ambulatory Visit (INDEPENDENT_AMBULATORY_CARE_PROVIDER_SITE_OTHER): Payer: Medicare Other | Admitting: Family Medicine

## 2013-04-08 VITALS — BP 141/76 | HR 61 | Temp 97.5°F | Ht 63.0 in | Wt 165.0 lb

## 2013-04-08 DIAGNOSIS — F411 Generalized anxiety disorder: Secondary | ICD-10-CM | POA: Diagnosis not present

## 2013-04-08 DIAGNOSIS — R5381 Other malaise: Secondary | ICD-10-CM | POA: Diagnosis not present

## 2013-04-08 DIAGNOSIS — R5383 Other fatigue: Secondary | ICD-10-CM | POA: Diagnosis not present

## 2013-04-08 DIAGNOSIS — E785 Hyperlipidemia, unspecified: Secondary | ICD-10-CM | POA: Diagnosis not present

## 2013-04-08 DIAGNOSIS — J069 Acute upper respiratory infection, unspecified: Secondary | ICD-10-CM | POA: Diagnosis not present

## 2013-04-08 DIAGNOSIS — E538 Deficiency of other specified B group vitamins: Secondary | ICD-10-CM | POA: Diagnosis not present

## 2013-04-08 DIAGNOSIS — E559 Vitamin D deficiency, unspecified: Secondary | ICD-10-CM | POA: Diagnosis not present

## 2013-04-08 LAB — POCT CBC
Granulocyte percent: 83.9 %G — AB (ref 37–80)
HCT, POC: 49 % — AB (ref 37.7–47.9)
Hemoglobin: 15.6 g/dL (ref 12.2–16.2)
Lymph, poc: 1.7 (ref 0.6–3.4)
MCH, POC: 28.7 pg (ref 27–31.2)
MCHC: 32 g/dL (ref 31.8–35.4)
MCV: 89.6 fL (ref 80–97)
MPV: 8.4 fL (ref 0–99.8)
POC Granulocyte: 10.6 — AB (ref 2–6.9)
POC LYMPH PERCENT: 13.8 %L (ref 10–50)
Platelet Count, POC: 242 10*3/uL (ref 142–424)
RBC: 5.5 M/uL — AB (ref 4.04–5.48)
RDW, POC: 14.1 %
WBC: 12.6 10*3/uL — AB (ref 4.6–10.2)

## 2013-04-08 MED ORDER — CIPROFLOXACIN HCL 500 MG PO TABS: 500.0000 mg | ORAL_TABLET | Freq: Two times a day (BID) | ORAL | Status: DC

## 2013-04-08 MED ORDER — BUSPIRONE HCL 10 MG PO TABS: 10.0000 mg | ORAL_TABLET | Freq: Three times a day (TID) | ORAL | Status: DC

## 2013-04-08 MED ORDER — CITALOPRAM HYDROBROMIDE 20 MG PO TABS: 20.0000 mg | ORAL_TABLET | Freq: Every day | ORAL | Status: DC
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
## 2013-04-09 LAB — CMP14+EGFR
ALT: 19 IU/L (ref 0–32)
AST: 17 IU/L (ref 0–40)
Albumin/Globulin Ratio: 1.9 (ref 1.1–2.5)
Albumin: 4.7 g/dL (ref 3.5–4.8)
Alkaline Phosphatase: 115 IU/L (ref 39–117)
BUN/Creatinine Ratio: 17 (ref 11–26)
BUN: 14 mg/dL (ref 8–27)
CO2: 26 mmol/L (ref 18–29)
Calcium: 10.1 mg/dL (ref 8.7–10.3)
Chloride: 98 mmol/L (ref 97–108)
Creatinine, Ser: 0.81 mg/dL (ref 0.57–1.00)
GFR calc Af Amer: 84 mL/min/{1.73_m2} (ref >59)
GFR calc non Af Amer: 73 mL/min/{1.73_m2} (ref >59)
Globulin, Total: 2.5 g/dL (ref 1.5–4.5)
Glucose: 86 mg/dL (ref 65–99)
Potassium: 5.1 mmol/L (ref 3.5–5.2)
Sodium: 142 mmol/L (ref 134–144)
Total Bilirubin: 0.9 mg/dL (ref 0.0–1.2)
Total Protein: 7.2 g/dL (ref 6.0–8.5)

## 2013-04-09 LAB — LIPID PANEL
Chol/HDL Ratio: 2.6 ratio units (ref 0.0–4.4)
Cholesterol, Total: 207 mg/dL — ABNORMAL HIGH (ref 100–199)
HDL: 80 mg/dL (ref >39)
LDL Calculated: 105 mg/dL — ABNORMAL HIGH (ref 0–99)
Triglycerides: 111 mg/dL (ref 0–149)
VLDL Cholesterol Cal: 22 mg/dL (ref 5–40)

## 2013-04-09 LAB — VITAMIN D 25 HYDROXY (VIT D DEFICIENCY, FRACTURES): Vit D, 25-Hydroxy: 25.3 ng/mL — ABNORMAL LOW (ref 30.0–100.0)

## 2013-04-09 LAB — TSH: TSH: 0.951 u[IU]/mL (ref 0.450–4.500)

## 2013-04-09 LAB — VITAMIN B12: Vitamin B-12: 401 pg/mL (ref 211–946)

## 2013-04-22 ENCOUNTER — Ambulatory Visit: Payer: Medicare Other | Admitting: Family Medicine

## 2013-05-05 ENCOUNTER — Telehealth: Payer: Self-pay | Admitting: Family Medicine

## 2013-05-06 ENCOUNTER — Other Ambulatory Visit: Payer: Self-pay | Admitting: Family Medicine

## 2013-05-06 MED ORDER — VITAMIN D (ERGOCALCIFEROL) 1.25 MG (50000 UNIT) PO CAPS: 50000.0000 [IU] | ORAL_CAPSULE | ORAL | Status: DC

## 2013-05-06 NOTE — Telephone Encounter (Signed)
Did you want to start patient on Vit D and B12 injections? She was under the impression that you did.  Vit D was below normal and B12 was WNL.

## 2013-05-06 NOTE — Telephone Encounter (Signed)
Vit D rx sent to pharm and no need for b12 injections due to normal lab value

## 2013-05-06 NOTE — Telephone Encounter (Signed)
Left message on voicemail with this information.

## 2013-05-13 ENCOUNTER — Encounter: Payer: Self-pay | Admitting: Cardiology

## 2013-05-13 DIAGNOSIS — G894 Chronic pain syndrome: Secondary | ICD-10-CM | POA: Diagnosis not present

## 2013-05-13 DIAGNOSIS — IMO0001 Reserved for inherently not codable concepts without codable children: Secondary | ICD-10-CM | POA: Diagnosis not present

## 2013-05-13 DIAGNOSIS — M5106 Intervertebral disc disorders with myelopathy, lumbar region: Secondary | ICD-10-CM | POA: Diagnosis not present

## 2013-05-13 DIAGNOSIS — M4716 Other spondylosis with myelopathy, lumbar region: Secondary | ICD-10-CM | POA: Diagnosis not present

## 2013-05-18 ENCOUNTER — Telehealth: Payer: Self-pay | Admitting: Family Medicine

## 2013-05-18 NOTE — Telephone Encounter (Signed)
Patient needed her last note sent to Dr. Ernesto Rutherford at [facility] i went ahead and sent over the notes for her

## 2013-05-18 NOTE — Telephone Encounter (Signed)
error 

## 2013-05-19 DIAGNOSIS — H6120 Impacted cerumen, unspecified ear: Secondary | ICD-10-CM | POA: Diagnosis not present

## 2013-05-21 DIAGNOSIS — H6120 Impacted cerumen, unspecified ear: Secondary | ICD-10-CM | POA: Diagnosis not present

## 2013-05-24 ENCOUNTER — Ambulatory Visit: Payer: Medicare Other | Admitting: General Practice

## 2013-05-31 ENCOUNTER — Ambulatory Visit: Payer: Medicare Other | Admitting: Cardiology

## 2013-06-01 ENCOUNTER — Encounter: Payer: Self-pay | Admitting: Cardiology

## 2013-06-01 ENCOUNTER — Ambulatory Visit (INDEPENDENT_AMBULATORY_CARE_PROVIDER_SITE_OTHER): Payer: Medicare Other | Admitting: Cardiology

## 2013-06-01 VITALS — BP 134/78 | HR 60 | Ht 63.0 in | Wt 167.8 lb

## 2013-06-01 DIAGNOSIS — Z139 Encounter for screening, unspecified: Secondary | ICD-10-CM

## 2013-06-01 DIAGNOSIS — I10 Essential (primary) hypertension: Secondary | ICD-10-CM | POA: Diagnosis not present

## 2013-06-01 DIAGNOSIS — R0789 Other chest pain: Secondary | ICD-10-CM | POA: Diagnosis not present

## 2013-06-01 DIAGNOSIS — E785 Hyperlipidemia, unspecified: Secondary | ICD-10-CM | POA: Diagnosis not present

## 2013-06-01 NOTE — Patient Instructions (Signed)
Your physician recommends that you schedule a follow-up appointment in: 1 year with Dr. Branch. You should receive a letter in the mail in 10 months. If you do not receive this letter by February 2016 call our office to schedule this appointment.   Your physician recommends that you continue on your current medications as directed. Please refer to the Current Medication list given to you today.   

## 2013-06-01 NOTE — Progress Notes (Signed)
Clinical Summary Kaitlyn Serrano is a 73 y.o.female former patient of Dr Rollene Fare at [facility], this is our first visit together.   1. Atypical chest pain - previous workup in 2012 per old [place] notes with negative myoview for ischemia. Echo showed normal LV function - reports some occasional chest pain, shooting like pain that radiates to epigastric area. Worst with movement. Non-exertaional  2. HTN - does not check regulalrly -compliant with meds  3. Hyperlipidemia - compliant with simvastatin - 03/2013 TC 207 TG 111 HDL 80 LDL 105    Past Medical History  Diagnosis Date  . Sinus infection   . Hypertension   . High cholesterol   . Insomnia   . Blood transfusion   . GERD (gastroesophageal reflux disease)   . Hemorrhoids   . Glaucoma   . Diverticulosis      Allergies  Allergen Reactions  . Bee Venom Swelling  . Elavil [Amitriptyline]   . Penicillins Other (See Comments)    "PEELS SKIN OFF MOUTH"     Current Outpatient Prescriptions  Medication Sig Dispense Refill  . atenolol (TENORMIN) 50 MG tablet Take 1 tablet (50 mg total) by mouth daily.  90 tablet  3  . busPIRone (BUSPAR) 10 MG tablet Take 1 tablet (10 mg total) by mouth 3 (three) times daily.  90 tablet  3  . ciprofloxacin (CIPRO) 500 MG tablet Take 1 tablet (500 mg total) by mouth 2 (two) times daily.  20 tablet  0  . citalopram (CELEXA) 20 MG tablet Take 1 tablet (20 mg total) by mouth daily.  30 tablet  11  . diltiazem (CARDIZEM) 120 MG tablet Take 1 tablet (120 mg total) by mouth at bedtime.  90 tablet  3  . furosemide (LASIX) 40 MG tablet Take 1 tablet (40 mg total) by mouth daily.  30 tablet  11  . lactulose (CHRONULAC) 10 GM/15ML solution Take 30 mLs (20 g total) by mouth 2 (two) times daily as needed (constipation).  240 mL  0  . pantoprazole (PROTONIX) 40 MG tablet Take 1 tablet (40 mg total) by mouth 2 (two) times daily.  180 tablet  3  . promethazine (PHENERGAN) 12.5 MG tablet  Take 1 tablet (12.5 mg total) by mouth every 8 (eight) hours as needed for nausea.  20 tablet  0  . simvastatin (ZOCOR) 20 MG tablet Take 1 tablet (20 mg total) by mouth at bedtime.  30 tablet  11  . traZODone (DESYREL) 50 MG tablet Take one to two to three po qhs prn sleep  90 tablet  3  . Vitamin D, Ergocalciferol, (DRISDOL) 50000 UNITS CAPS capsule Take 1 capsule (50,000 Units total) by mouth every 7 (seven) days.  18 capsule  0   No current facility-administered medications for this visit.     Past Surgical History  Procedure Laterality Date  . Cervical spine surgery    . Back surgery       Allergies  Allergen Reactions  . Bee Venom Swelling  . Elavil [Amitriptyline]   . Penicillins Other (See Comments)    "PEELS SKIN OFF MOUTH"      Family History  Problem Relation Age of Onset  . Diabetes Mother   . Heart disease Mother   . Hypertension Mother   . Heart disease Father      Social History Ms. Fletes reports that she quit smoking about 49 years ago. Her smoking use included Cigarettes. She has a 12 pack-year  smoking history. She has quit using smokeless tobacco. Ms. Resendes reports that she does not drink alcohol.   Review of Systems CONSTITUTIONAL: No weight loss, fever, chills, weakness or fatigue.  HEENT: Eyes: No visual loss, blurred vision, double vision or yellow sclerae.No hearing loss, sneezing, congestion, runny nose or sore throat.  SKIN: No rash or itching.  CARDIOVASCULAR:per HPI  RESPIRATORY: No shortness of breath, cough or sputum.  GASTROINTESTINAL: No anorexia, nausea, vomiting or diarrhea. No abdominal pain or blood.  GENITOURINARY: No burning on urination, no polyuria NEUROLOGICAL: No headache, dizziness, syncope, paralysis, ataxia, numbness or tingling in the extremities. No change in bowel or bladder control.  MUSCULOSKELETAL: No muscle, back pain, joint pain or stiffness.  LYMPHATICS: No enlarged nodes. No history of splenectomy.    PSYCHIATRIC: No history of depression or anxiety.  ENDOCRINOLOGIC: No reports of sweating, cold or heat intolerance. No polyuria or polydipsia.  Marland Kitchen   Physical Examination p 60 bp 134/78 Wt 30 Gen: resting comfortably, no acute distress HEENT: no scleral icterus, pupils equal round and reactive, no palptable cervical adenopathy,  CV: RRR, normal Resp: Clear to auscultation bilaterally GI: abdomen is soft, non-tender, non-distended, normal bowel sounds, no hepatosplenomegaly MSK: extremities are warm, no edema.  Skin: warm, no rash Neuro:  no focal deficits Psych: appropriate affect   Diagnostic Studies Echo 04/2010 [facility] LVEF >97%, grade I diastolic dysfunction, mild MR  04/2010 ExerciseMyoview No ischemia. Exercised 8 minutes, 9 METs, 95% THR.  12/2007 Renal artery [place] No stenosis, normal kidney size.   06/01/13 [facility] EKG NSR, LAD  Assessment and Plan   1. Atypical chest pain - continues to have atypical chest pain, negative evaluation in 2012 with echo and stress test - continue to follow clinically  2. HTN - at goal, continue current meds  3. Hyperlipidemia - at goal, continue current meds  F/u 1 year     Arnoldo Lenis, M.D., F.A.C.C.

## 2013-06-04 ENCOUNTER — Encounter: Payer: Self-pay | Admitting: Cardiovascular Disease

## 2013-06-08 ENCOUNTER — Other Ambulatory Visit: Payer: Self-pay | Admitting: Family Medicine

## 2013-06-10 NOTE — Telephone Encounter (Signed)
Do not see on current med list. Please advise 

## 2013-06-25 ENCOUNTER — Encounter: Payer: Self-pay | Admitting: Family Medicine

## 2013-06-25 ENCOUNTER — Ambulatory Visit (INDEPENDENT_AMBULATORY_CARE_PROVIDER_SITE_OTHER): Payer: Medicare Other | Admitting: Family Medicine

## 2013-06-25 VITALS — BP 133/76 | HR 56 | Temp 98.5°F | Ht 63.0 in | Wt 165.2 lb

## 2013-06-25 DIAGNOSIS — E559 Vitamin D deficiency, unspecified: Secondary | ICD-10-CM

## 2013-06-25 DIAGNOSIS — R609 Edema, unspecified: Secondary | ICD-10-CM | POA: Diagnosis not present

## 2013-06-25 DIAGNOSIS — E785 Hyperlipidemia, unspecified: Secondary | ICD-10-CM

## 2013-06-25 DIAGNOSIS — I10 Essential (primary) hypertension: Secondary | ICD-10-CM | POA: Diagnosis not present

## 2013-06-25 DIAGNOSIS — K219 Gastro-esophageal reflux disease without esophagitis: Secondary | ICD-10-CM

## 2013-06-25 DIAGNOSIS — F411 Generalized anxiety disorder: Secondary | ICD-10-CM

## 2013-06-25 DIAGNOSIS — K5900 Constipation, unspecified: Secondary | ICD-10-CM

## 2013-06-25 MED ORDER — PROMETHAZINE HCL 12.5 MG PO TABS: 12.5000 mg | ORAL_TABLET | Freq: Three times a day (TID) | ORAL | Status: DC | PRN

## 2013-06-25 MED ORDER — ALPRAZOLAM 0.5 MG PO TABS: 0.2500 mg | ORAL_TABLET | Freq: Two times a day (BID) | ORAL | Status: DC | PRN

## 2013-06-25 MED ORDER — LACTULOSE 10 GM/15ML PO SOLN: 20.0000 g | Freq: Two times a day (BID) | ORAL | Status: DC | PRN

## 2013-06-25 MED ORDER — ATENOLOL 50 MG PO TABS: 50.0000 mg | ORAL_TABLET | Freq: Every day | ORAL | Status: DC

## 2013-06-25 MED ORDER — VITAMIN D (ERGOCALCIFEROL) 1.25 MG (50000 UNIT) PO CAPS: 50000.0000 [IU] | ORAL_CAPSULE | ORAL | Status: DC

## 2013-06-25 MED ORDER — FUROSEMIDE 40 MG PO TABS: 40.0000 mg | ORAL_TABLET | Freq: Every day | ORAL | Status: DC

## 2013-06-25 MED ORDER — SIMVASTATIN 20 MG PO TABS: 20.0000 mg | ORAL_TABLET | Freq: Every day | ORAL | Status: DC

## 2013-06-25 MED ORDER — TRAZODONE HCL 50 MG PO TABS: 50.0000 mg | ORAL_TABLET | Freq: Every evening | ORAL | Status: DC | PRN

## 2013-06-25 MED ORDER — PANTOPRAZOLE SODIUM 40 MG PO TBEC: 40.0000 mg | DELAYED_RELEASE_TABLET | Freq: Two times a day (BID) | ORAL | Status: DC

## 2013-06-25 MED ORDER — ESTRADIOL ACETATE 0.05 MG/24HR VA RING: 0.0500 | VAGINAL_RING | Freq: Once | VAGINAL | Status: DC

## 2013-06-25 MED ORDER — DILTIAZEM HCL 120 MG PO TABS: 120.0000 mg | ORAL_TABLET | Freq: Every day | ORAL | Status: DC

## 2013-06-25 NOTE — Progress Notes (Signed)
   Subjective:    Patient ID: Kaitlyn Serrano, female    DOB: January 20, 1941, 73 y.o.   MRN: [id number]  HPI This 73 y.o. female presents for evaluation of needing refills.  She has hx of anxiety and she is needing Refill on xanax.  She has been rx'd SSRI medicine but she quit taking and states she doesn't need the Medicine and she has been taking xanax for years and she doesn't understand why she has to come in for an appointment.  She doesn't like to have medicine push on her and she isn't going to take any  Other type of depression medicine she states.  She has hx of insomnia and needs refill on trazadone. She has hx of gerd, hyperlipidemia, constipation, nausea, hypertension, HRT, and pain.   Review of Systems No chest pain, SOB, HA, dizziness, vision change, N/V, diarrhea, constipation, dysuria, urinary urgency or frequency, myalgias, arthralgias or rash.     Objective:   Physical Exam Vital signs noted  Well developed well nourished female.  HEENT - Head atraumatic Normocephalic                Eyes - PERRLA, Conjuctiva - clear Sclera- Clear EOMI                Ears - EAC's Wnl TM's Wnl Gross Hearing WNL                Nose - Nares patent                 Throat - oropharanx wnl Respiratory - Lungs CTA bilateral Cardiac - RRR S1 and S2 without murmur GI - Abdomen soft Nontender and bowel sounds active x 4 Extremities - No edema. Neuro - Grossly intact.       Assessment & Plan:  Other and unspecified hyperlipidemia - Plan: simvastatin (ZOCOR) 20 MG tablet  GERD (gastroesophageal reflux disease) - Plan: pantoprazole (PROTONIX) 40 MG tablet  Essential hypertension, benign - Plan: furosemide (LASIX) 40 MG tablet, diltiazem (CARDIZEM) 120 MG tablet, atenolol (TENORMIN) 50 MG tablet  Edema - Plan: furosemide (LASIX) 40 MG tablet  Unspecified constipation - Plan: lactulose (CHRONULAC) 10 GM/15ML solution  Unspecified vitamin D deficiency - Plan: Vitamin D, Ergocalciferol,  (DRISDOL) 50000 UNITS CAPS capsule  Anxiety state, unspecified - Plan: ALPRAZolam (XANAX) 0.5 MG tablet Refuses to go on SSRI.  In light of her having taken this med for years will refill but I did try and explain The rational for going on a maintenance anti-anxiety medicine and she refuses stating she doesn't like to have medicine pushed her and she will follow up if having to escalate on the xanax.  Lysbeth Penner FNP

## 2013-07-06 ENCOUNTER — Other Ambulatory Visit (INDEPENDENT_AMBULATORY_CARE_PROVIDER_SITE_OTHER): Payer: Medicare Other

## 2013-07-06 DIAGNOSIS — E785 Hyperlipidemia, unspecified: Secondary | ICD-10-CM | POA: Diagnosis not present

## 2013-07-06 DIAGNOSIS — I10 Essential (primary) hypertension: Secondary | ICD-10-CM

## 2013-07-06 DIAGNOSIS — M2020 Hallux rigidus, unspecified foot: Secondary | ICD-10-CM | POA: Diagnosis not present

## 2013-07-06 DIAGNOSIS — M779 Enthesopathy, unspecified: Secondary | ICD-10-CM | POA: Diagnosis not present

## 2013-07-07 LAB — LIPID PANEL
Chol/HDL Ratio: 2.8 ratio units (ref 0.0–4.4)
Cholesterol, Total: 167 mg/dL (ref 100–199)
HDL: 59 mg/dL (ref >39)
LDL Calculated: 85 mg/dL (ref 0–99)
Triglycerides: 115 mg/dL (ref 0–149)
VLDL Cholesterol Cal: 23 mg/dL (ref 5–40)

## 2013-07-07 LAB — CMP14+EGFR
ALT: 16 IU/L (ref 0–32)
AST: 17 IU/L (ref 0–40)
Albumin/Globulin Ratio: 1.8 (ref 1.1–2.5)
Albumin: 3.8 g/dL (ref 3.5–4.8)
Alkaline Phosphatase: 75 IU/L (ref 39–117)
BUN/Creatinine Ratio: 15 (ref 11–26)
BUN: 10 mg/dL (ref 8–27)
CO2: 29 mmol/L (ref 18–29)
Calcium: 9.4 mg/dL (ref 8.7–10.3)
Chloride: 102 mmol/L (ref 97–108)
Creatinine, Ser: 0.65 mg/dL (ref 0.57–1.00)
GFR calc Af Amer: 102 mL/min/{1.73_m2} (ref >59)
GFR calc non Af Amer: 88 mL/min/{1.73_m2} (ref >59)
Globulin, Total: 2.1 g/dL (ref 1.5–4.5)
Glucose: 86 mg/dL (ref 65–99)
Potassium: 4.7 mmol/L (ref 3.5–5.2)
Sodium: 144 mmol/L (ref 134–144)
Total Bilirubin: 0.8 mg/dL (ref 0.0–1.2)
Total Protein: 5.9 g/dL — ABNORMAL LOW (ref 6.0–8.5)

## 2013-07-15 DIAGNOSIS — Z79899 Other long term (current) drug therapy: Secondary | ICD-10-CM | POA: Diagnosis not present

## 2013-07-15 DIAGNOSIS — G894 Chronic pain syndrome: Secondary | ICD-10-CM | POA: Diagnosis not present

## 2013-07-21 ENCOUNTER — Encounter: Payer: Self-pay | Admitting: Family Medicine

## 2013-07-21 ENCOUNTER — Ambulatory Visit (INDEPENDENT_AMBULATORY_CARE_PROVIDER_SITE_OTHER): Payer: Medicare Other | Admitting: Family Medicine

## 2013-07-21 VITALS — BP 140/77 | HR 65 | Temp 99.1°F | Ht 63.0 in | Wt 166.4 lb

## 2013-07-21 DIAGNOSIS — J209 Acute bronchitis, unspecified: Secondary | ICD-10-CM

## 2013-07-21 MED ORDER — HYDROCODONE-HOMATROPINE 5-1.5 MG/5ML PO SYRP: 5.0000 mL | ORAL_SOLUTION | Freq: Three times a day (TID) | ORAL | Status: DC | PRN

## 2013-07-21 MED ORDER — AZITHROMYCIN 250 MG PO TABS: ORAL_TABLET | ORAL | Status: DC

## 2013-07-21 NOTE — Progress Notes (Signed)
   Subjective:    Patient ID: Kaitlyn Serrano, female    DOB: August 12, 1940, 73 y.o.   MRN: [id number]  HPI  This 73 y.o. female presents for evaluation of uri sx's and cough.  Review of Systems    No chest pain, SOB, HA, dizziness, vision change, N/V, diarrhea, constipation, dysuria, urinary urgency or frequency, myalgias, arthralgias or rash.  Objective:   Physical Exam  Vital signs noted  Well developed well nourished female.  HEENT - Head atraumatic Normocephalic                Eyes - PERRLA, Conjuctiva - clear Sclera- Clear EOMI                Ears - EAC's Wnl TM's Wnl Gross Hearing WNL                Nose - Nares patent                 Throat - oropharanx wnl Respiratory - Lungs CTA bilateral Cardiac - RRR S1 and S2 without murmur GI - Abdomen soft Nontender and bowel sounds active x 4 Extremities - No edema. Neuro - Grossly intact.      Assessment & Plan:  Acute bronchitis - Plan: HYDROcodone-homatropine (HYCODAN) 5-1.5 MG/5ML syrup, azithromycin (ZITHROMAX) 250 MG tablet  Push po fluids, rest, tylenol and motrin otc prn as directed for fever, arthralgias, and myalgias.  Follow up prn if sx's continue or persist.  Lysbeth Penner FNP

## 2013-08-03 DIAGNOSIS — M779 Enthesopathy, unspecified: Secondary | ICD-10-CM | POA: Diagnosis not present

## 2013-08-03 DIAGNOSIS — M2020 Hallux rigidus, unspecified foot: Secondary | ICD-10-CM | POA: Diagnosis not present

## 2013-08-13 ENCOUNTER — Telehealth: Payer: Self-pay | Admitting: Family

## 2013-08-13 NOTE — Telephone Encounter (Signed)
Pt has SOB and pain, "hurts to breath" Offered appt Pt will call back to schedule

## 2013-08-14 ENCOUNTER — Encounter (HOSPITAL_COMMUNITY): Payer: Self-pay | Admitting: Emergency Medicine

## 2013-08-14 ENCOUNTER — Emergency Department (HOSPITAL_COMMUNITY)
Admission: EM | Admit: 2013-08-14 | Discharge: 2013-08-14 | Disposition: A | Discharge status: Home / Self Care | Payer: Medicare Other | Attending: Emergency Medicine | Admitting: Emergency Medicine

## 2013-08-14 ENCOUNTER — Emergency Department (HOSPITAL_COMMUNITY): Payer: Medicare Other

## 2013-08-14 DIAGNOSIS — Z9889 Other specified postprocedural states: Secondary | ICD-10-CM | POA: Insufficient documentation

## 2013-08-14 DIAGNOSIS — G8929 Other chronic pain: Secondary | ICD-10-CM | POA: Insufficient documentation

## 2013-08-14 DIAGNOSIS — I10 Essential (primary) hypertension: Secondary | ICD-10-CM | POA: Diagnosis not present

## 2013-08-14 DIAGNOSIS — R05 Cough: Secondary | ICD-10-CM | POA: Insufficient documentation

## 2013-08-14 DIAGNOSIS — J3489 Other specified disorders of nose and nasal sinuses: Secondary | ICD-10-CM | POA: Diagnosis not present

## 2013-08-14 DIAGNOSIS — K219 Gastro-esophageal reflux disease without esophagitis: Secondary | ICD-10-CM | POA: Diagnosis not present

## 2013-08-14 DIAGNOSIS — Z8709 Personal history of other diseases of the respiratory system: Secondary | ICD-10-CM | POA: Insufficient documentation

## 2013-08-14 DIAGNOSIS — M546 Pain in thoracic spine: Secondary | ICD-10-CM | POA: Insufficient documentation

## 2013-08-14 DIAGNOSIS — Z79899 Other long term (current) drug therapy: Secondary | ICD-10-CM | POA: Diagnosis not present

## 2013-08-14 DIAGNOSIS — Z87891 Personal history of nicotine dependence: Secondary | ICD-10-CM | POA: Insufficient documentation

## 2013-08-14 DIAGNOSIS — H409 Unspecified glaucoma: Secondary | ICD-10-CM | POA: Insufficient documentation

## 2013-08-14 DIAGNOSIS — R059 Cough, unspecified: Secondary | ICD-10-CM | POA: Insufficient documentation

## 2013-08-14 DIAGNOSIS — Z880 Allergy status to penicillin: Secondary | ICD-10-CM | POA: Diagnosis not present

## 2013-08-14 DIAGNOSIS — E7800 Pure hypercholesterolemia, unspecified: Secondary | ICD-10-CM | POA: Insufficient documentation

## 2013-08-14 DIAGNOSIS — R079 Chest pain, unspecified: Secondary | ICD-10-CM | POA: Diagnosis not present

## 2013-08-14 DIAGNOSIS — Z792 Long term (current) use of antibiotics: Secondary | ICD-10-CM | POA: Insufficient documentation

## 2013-08-14 MED ORDER — DIAZEPAM 5 MG PO TABS: 5.0000 mg | ORAL_TABLET | Freq: Three times a day (TID) | ORAL | Status: DC | PRN

## 2013-08-14 NOTE — ED Notes (Signed)
Chronic back pain, c/o worsening back pain right side, hx of back surgery, denies injury

## 2013-08-14 NOTE — Discharge Instructions (Signed)

## 2013-08-14 NOTE — ED Provider Notes (Signed)
CSN: [id number]     Arrival date & time 08/14/13  1506 History   First MD Initiated Contact with Patient 08/14/13 1531     Chief Complaint  Patient presents with  . Back Pain   Patient is a 73 y.o. female presenting with back pain. The history is provided by the patient. No language interpreter was used.  Back Pain  This chart was scribed for Kaitlyn Serrano, * by Thea Alken, ED Scribe. This patient was seen in room APA04/APA04 and the patient's care was started at 3:32 PM.  HPI Comments:  Kaitlyn Serrano is a 73 y.o. female who present to the Emergency Department complaining of worsening back pain with associated congestion and minor cough. Pt reports soreness with touch and with movement. Pt has tried muscle relaxer without relief to the pain. She than tried an 81mg  aspirin with some relief. Pt denies rash. Pt has h/o lumbar back surgery. Pt spoke with PCP yesterday evening.   Past Medical History  Diagnosis Date  . Sinus infection   . Hypertension   . High cholesterol   . Insomnia   . Blood transfusion   . GERD (gastroesophageal reflux disease)   . Hemorrhoids   . Glaucoma   . Diverticulosis    Past Surgical History  Procedure Laterality Date  . Cervical spine surgery    . Back surgery     Family History  Problem Relation Age of Onset  . Diabetes Mother   . Heart disease Mother   . Hypertension Mother   . Heart disease Father    History  Substance Use Topics  . Smoking status: Former Smoker -- 4.00 packs/day for 3 years    Types: Cigarettes    Quit date: 02/20/1964  . Smokeless tobacco: Former Systems developer  . Alcohol Use: No   OB History   Grav Para Term Preterm Abortions TAB SAB Ect Mult Living   4 3 3  1   1  3      Review of Systems  HENT: Positive for congestion.   Respiratory: Positive for cough.   Musculoskeletal: Positive for back pain.  Skin: Negative for rash.   Allergies  Bee venom; Elavil; and Penicillins  Home Medications   Prior to  Admission medications   Medication Sig Start Date End Date Taking? Authorizing Provider  ALPRAZolam Duanne Moron) 0.5 MG tablet Take 0.5 tablets (0.25 mg total) by mouth 2 (two) times daily as needed for anxiety. 06/25/13   Lysbeth Penner, FNP  atenolol (TENORMIN) 50 MG tablet Take 1 tablet (50 mg total) by mouth daily. 06/25/13   Lysbeth Penner, FNP  azithromycin (ZITHROMAX) 250 MG tablet Take 2 po first day and then one po qd x 4 days 07/21/13   Lysbeth Penner, FNP  cyclobenzaprine (FLEXERIL) 10 MG tablet Take 10 mg by mouth 2 (two) times daily as needed for muscle spasms.    Historical Provider, MD  diltiazem (CARDIZEM) 120 MG tablet Take 1 tablet (120 mg total) by mouth at bedtime. 06/25/13   Lysbeth Penner, FNP  Estradiol Acetate (FEMRING) 0.05 MG/24HR RING Place 0.05 each vaginally once. 06/25/13   Lysbeth Penner, FNP  furosemide (LASIX) 40 MG tablet Take 1 tablet (40 mg total) by mouth daily. 06/25/13   Lysbeth Penner, FNP  HYDROcodone-acetaminophen (NORCO) 10-325 MG per tablet Take 1 tablet by mouth every 6 (six) hours as needed.    Historical Provider, MD  HYDROcodone-homatropine (HYCODAN) 5-1.5 MG/5ML syrup Take 5 mLs  by mouth every 8 (eight) hours as needed for cough. 07/21/13   Lysbeth Penner, FNP  lactulose (CHRONULAC) 10 GM/15ML solution Take 30 mLs (20 g total) by mouth 2 (two) times daily as needed (constipation). 06/25/13   Lysbeth Penner, FNP  pantoprazole (PROTONIX) 40 MG tablet Take 1 tablet (40 mg total) by mouth 2 (two) times daily. 06/25/13   Lysbeth Penner, FNP  promethazine (PHENERGAN) 12.5 MG tablet Take 1 tablet (12.5 mg total) by mouth every 8 (eight) hours as needed for nausea. 06/25/13   Lysbeth Penner, FNP  simvastatin (ZOCOR) 20 MG tablet Take 1 tablet (20 mg total) by mouth at bedtime. 06/25/13   Lysbeth Penner, FNP  traZODone (DESYREL) 50 MG tablet Take 1-3 tablets (50-150 mg total) by mouth at bedtime as needed. Mostly tries to take one only. 06/25/13   Lysbeth Penner,  FNP  Vitamin D, Ergocalciferol, (DRISDOL) 50000 UNITS CAPS capsule Take 1 capsule (50,000 Units total) by mouth every 7 (seven) days. 06/25/13   Lysbeth Penner, FNP   BP 153/77  Pulse 69  Temp(Src) 97.4 F (36.3 C) (Oral)  Resp 20  Ht 5\' 2"  (1.575 m)  Wt 159 lb (72.122 kg)  BMI 29.07 kg/m2  SpO2 97% Physical Exam  Constitutional: She is oriented to person, place, and time. She appears well-developed and well-nourished. No distress.  HENT:  Head: Normocephalic and atraumatic.  Right Ear: Hearing normal.  Left Ear: Hearing normal.  Nose: Nose normal.  Mouth/Throat: Oropharynx is clear and moist and mucous membranes are normal.  Eyes: Conjunctivae and EOM are normal. Pupils are equal, round, and reactive to light.  Neck: Normal range of motion. Neck supple.  Cardiovascular: Regular rhythm, S1 normal and S2 normal.  Exam reveals no gallop and no friction rub.   No murmur heard. Pulmonary/Chest: Effort normal and breath sounds normal. No respiratory distress. She exhibits no tenderness.  Abdominal: Soft. Normal appearance and bowel sounds are normal. There is no hepatosplenomegaly. There is no tenderness. There is no rebound, no guarding, no tenderness at McBurney's point and negative Murphy's sign. No hernia.  Musculoskeletal: Normal range of motion.  Tender to right posterior rib area  Neurological: She is alert and oriented to person, place, and time. She has normal strength. No cranial nerve deficit or sensory deficit. Coordination normal. GCS eye subscore is 4. GCS verbal subscore is 5. GCS motor subscore is 6.  Skin: Skin is warm, dry and intact. No rash noted. No cyanosis.  Psychiatric: She has a normal mood and affect. Her speech is normal and behavior is normal. Thought content normal.    ED Course  Procedures  3:37 PM-Discussed treatment plan which includes an X-Ray with pt at bedside and pt agreed to plan.  Labs Review Labs Reviewed - No data to display  Imaging  Review No results found.   EKG Interpretation None      MDM   Final diagnoses:  None   Back Pain  Patient presented to the ER for evaluation of pain in the right posterior ribs and back area. She has a history of chronic lower back pain and surgery, but this pain is higher than she's used to. She denies any injury. The pain started yesterday and has progressively worsened. Pain is very reproducible here in the ER. She has pain with bending and twisting. She has difficulty rolling over in bed or sitting up because of the pain. This is very consistent with musculoskeletal pain.  There is no anterior chest pain. Is not consistent with acute coronary syndrome or any type. She does report congestion and cough. She has no shortness of breath her lung sounds are clear. Rib x-ray and chest x-ray obtained, no acute pathology seen. Symptoms most consistent with acute muscular spasm in the thoracic region, treated with rest and analgesia.  I personally performed the services described in this documentation, which was scribed in my presence. The recorded information has been reviewed and is accurate.     Kaitlyn Greek, MD 08/14/13 [phone]

## 2013-09-08 DIAGNOSIS — H02839 Dermatochalasis of unspecified eye, unspecified eyelid: Secondary | ICD-10-CM | POA: Diagnosis not present

## 2013-09-08 DIAGNOSIS — H40059 Ocular hypertension, unspecified eye: Secondary | ICD-10-CM | POA: Diagnosis not present

## 2013-09-08 DIAGNOSIS — Z961 Presence of intraocular lens: Secondary | ICD-10-CM | POA: Diagnosis not present

## 2013-09-10 DIAGNOSIS — G894 Chronic pain syndrome: Secondary | ICD-10-CM | POA: Diagnosis not present

## 2013-09-10 DIAGNOSIS — M545 Low back pain, unspecified: Secondary | ICD-10-CM | POA: Diagnosis not present

## 2013-09-16 DIAGNOSIS — M12279 Villonodular synovitis (pigmented), unspecified ankle and foot: Secondary | ICD-10-CM | POA: Diagnosis not present

## 2013-09-16 DIAGNOSIS — M2020 Hallux rigidus, unspecified foot: Secondary | ICD-10-CM | POA: Diagnosis not present

## 2013-09-16 DIAGNOSIS — M779 Enthesopathy, unspecified: Secondary | ICD-10-CM | POA: Diagnosis not present

## 2013-09-24 DIAGNOSIS — M12279 Villonodular synovitis (pigmented), unspecified ankle and foot: Secondary | ICD-10-CM | POA: Diagnosis not present

## 2013-09-24 DIAGNOSIS — M659 Synovitis and tenosynovitis, unspecified: Secondary | ICD-10-CM | POA: Diagnosis not present

## 2013-09-24 DIAGNOSIS — M65979 Unspecified synovitis and tenosynovitis, unspecified ankle and foot: Secondary | ICD-10-CM | POA: Diagnosis not present

## 2013-09-24 DIAGNOSIS — M79609 Pain in unspecified limb: Secondary | ICD-10-CM | POA: Diagnosis not present

## 2013-09-24 DIAGNOSIS — I739 Peripheral vascular disease, unspecified: Secondary | ICD-10-CM | POA: Diagnosis not present

## 2013-09-24 DIAGNOSIS — M25579 Pain in unspecified ankle and joints of unspecified foot: Secondary | ICD-10-CM | POA: Diagnosis not present

## 2013-09-24 DIAGNOSIS — L608 Other nail disorders: Secondary | ICD-10-CM | POA: Diagnosis not present

## 2013-10-04 ENCOUNTER — Ambulatory Visit (INDEPENDENT_AMBULATORY_CARE_PROVIDER_SITE_OTHER): Payer: Medicare Other | Admitting: Family Medicine

## 2013-10-04 VITALS — BP 136/71 | HR 72 | Temp 97.6°F | Ht 62.0 in | Wt 167.4 lb

## 2013-10-04 DIAGNOSIS — M2010 Hallux valgus (acquired), unspecified foot: Secondary | ICD-10-CM | POA: Diagnosis not present

## 2013-10-04 DIAGNOSIS — J209 Acute bronchitis, unspecified: Secondary | ICD-10-CM | POA: Diagnosis not present

## 2013-10-04 DIAGNOSIS — J208 Acute bronchitis due to other specified organisms: Secondary | ICD-10-CM

## 2013-10-04 MED ORDER — AZITHROMYCIN 250 MG PO TABS: ORAL_TABLET | ORAL | Status: DC

## 2013-10-04 MED ORDER — NYSTATIN 100000 UNIT/ML MT SUSP: 5.0000 mL | Freq: Four times a day (QID) | OROMUCOSAL | Status: DC

## 2013-10-04 MED ORDER — METHYLPREDNISOLONE (PAK) 4 MG PO TABS: ORAL_TABLET | ORAL | Status: DC

## 2013-10-04 NOTE — Progress Notes (Signed)
   Subjective:    Patient ID: Kaitlyn Serrano, female    DOB: 06/16/1940, 73 y.o.   MRN: [id number]  HPI This 73 y.o. female presents for evaluation of uri symptoms and cough.   Review of Systems No chest pain, SOB, HA, dizziness, vision change, N/V, diarrhea, constipation, dysuria, urinary urgency or frequency, myalgias, arthralgias or rash.     Objective:   Physical Exam Vital signs noted  Well developed well nourished female.  HEENT - Head atraumatic Normocephalic                Eyes - PERRLA, Conjuctiva - clear Sclera- Clear EOMI                Ears - EAC's Wnl TM's Wnl Gross Hearing WNL                Throat - oropharanx wnl Respiratory - Lungs CTA bilateral Cardiac - RRR S1 and S2 without murmur GI - Abdomen soft Nontender and bowel sounds active x 4 Extremities - No edema. Neuro - Grossly intact.       Assessment & Plan:  Acute bronchitis due to other specified organisms - Plan: azithromycin (ZITHROMAX) 250 MG tablet, methylPREDNIsolone (MEDROL DOSPACK) 4 MG tablet, nystatin (MYCOSTATIN) 100000 UNIT/ML suspension  Push po fluids, rest, tylenol and motrin otc prn as directed for fever, arthralgias, and myalgias.  Follow up prn if sx's continue or persist.  Lysbeth Penner FNP

## 2013-10-06 ENCOUNTER — Encounter: Payer: Self-pay | Admitting: Family Medicine

## 2013-10-06 ENCOUNTER — Ambulatory Visit (INDEPENDENT_AMBULATORY_CARE_PROVIDER_SITE_OTHER): Payer: Medicare Other | Admitting: Family Medicine

## 2013-10-06 VITALS — BP 141/81 | HR 79 | Temp 97.9°F | Ht 63.0 in | Wt 171.0 lb

## 2013-10-06 DIAGNOSIS — T63463S Toxic effect of venom of wasps, assault, sequela: Secondary | ICD-10-CM

## 2013-10-06 DIAGNOSIS — T6591XS Toxic effect of unspecified substance, accidental (unintentional), sequela: Secondary | ICD-10-CM | POA: Diagnosis not present

## 2013-10-06 MED ORDER — EPINEPHRINE 0.3 MG/0.3ML IJ SOAJ: 0.3000 mg | Freq: Once | INTRAMUSCULAR | Status: DC

## 2013-10-06 MED ORDER — METHYLPREDNISOLONE ACETATE 80 MG/ML IJ SUSP
80.0000 mg | Freq: Once | INTRAMUSCULAR | Status: AC
  Administered 2013-10-06: 80 mg via INTRAMUSCULAR

## 2013-10-06 MED ORDER — HYDROXYZINE HCL 25 MG PO TABS: 25.0000 mg | ORAL_TABLET | Freq: Three times a day (TID) | ORAL | Status: DC | PRN
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
## 2013-10-08 ENCOUNTER — Ambulatory Visit (INDEPENDENT_AMBULATORY_CARE_PROVIDER_SITE_OTHER): Payer: Medicare Other | Admitting: Family

## 2013-10-08 ENCOUNTER — Encounter: Payer: Self-pay | Admitting: Family

## 2013-10-08 VITALS — BP 152/79 | HR 69 | Temp 97.2°F | Ht 63.0 in | Wt 170.8 lb

## 2013-10-08 DIAGNOSIS — R5381 Other malaise: Secondary | ICD-10-CM

## 2013-10-08 DIAGNOSIS — I10 Essential (primary) hypertension: Secondary | ICD-10-CM | POA: Diagnosis not present

## 2013-10-08 DIAGNOSIS — E785 Hyperlipidemia, unspecified: Secondary | ICD-10-CM

## 2013-10-08 DIAGNOSIS — Z13820 Encounter for screening for osteoporosis: Secondary | ICD-10-CM

## 2013-10-08 DIAGNOSIS — E559 Vitamin D deficiency, unspecified: Secondary | ICD-10-CM | POA: Diagnosis not present

## 2013-10-08 DIAGNOSIS — R5383 Other fatigue: Secondary | ICD-10-CM

## 2013-10-08 DIAGNOSIS — F411 Generalized anxiety disorder: Secondary | ICD-10-CM | POA: Diagnosis not present

## 2013-10-08 DIAGNOSIS — E531 Pyridoxine deficiency: Secondary | ICD-10-CM | POA: Diagnosis not present

## 2013-10-08 DIAGNOSIS — K5900 Constipation, unspecified: Secondary | ICD-10-CM | POA: Diagnosis not present

## 2013-10-08 DIAGNOSIS — K219 Gastro-esophageal reflux disease without esophagitis: Secondary | ICD-10-CM | POA: Diagnosis not present

## 2013-10-08 MED ORDER — SIMVASTATIN 20 MG PO TABS: 20.0000 mg | ORAL_TABLET | Freq: Every day | ORAL | Status: DC

## 2013-10-08 MED ORDER — ATENOLOL 50 MG PO TABS: 50.0000 mg | ORAL_TABLET | Freq: Every day | ORAL | Status: DC

## 2013-10-08 MED ORDER — FUROSEMIDE 40 MG PO TABS: 40.0000 mg | ORAL_TABLET | Freq: Two times a day (BID) | ORAL | Status: DC

## 2013-10-08 MED ORDER — ESTRADIOL ACETATE 0.05 MG/24HR VA RING: 0.0500 | VAGINAL_RING | Freq: Once | VAGINAL | Status: DC

## 2013-10-08 MED ORDER — DILTIAZEM HCL 120 MG PO TABS: 120.0000 mg | ORAL_TABLET | Freq: Every day | ORAL | Status: DC

## 2013-10-08 MED ORDER — TRAZODONE HCL 50 MG PO TABS: 50.0000 mg | ORAL_TABLET | Freq: Every day | ORAL | Status: DC

## 2013-10-08 MED ORDER — PANTOPRAZOLE SODIUM 40 MG PO TBEC: 40.0000 mg | DELAYED_RELEASE_TABLET | Freq: Two times a day (BID) | ORAL | Status: DC

## 2013-10-08 MED ORDER — PROMETHAZINE HCL 12.5 MG PO TABS: 12.5000 mg | ORAL_TABLET | Freq: Three times a day (TID) | ORAL | Status: DC | PRN

## 2013-10-08 MED ORDER — LACTULOSE 10 GM/15ML PO SOLN: 20.0000 g | Freq: Two times a day (BID) | ORAL | Status: DC | PRN

## 2013-10-08 MED ORDER — ALPRAZOLAM 0.5 MG PO TABS: 0.2500 mg | ORAL_TABLET | Freq: Two times a day (BID) | ORAL | Status: DC | PRN

## 2013-10-08 NOTE — Progress Notes (Signed)
Subjective:    Patient ID: Kaitlyn Serrano, female    DOB: 09/27/1940, 73 y.o.   MRN: [id number]  Hypertension This is a chronic problem. The current episode started more than 1 year ago. The problem has been waxing and waning since onset. The problem is uncontrolled. Associated symptoms include anxiety and palpitations. Pertinent negatives include no chest pain, headaches, peripheral edema or shortness of breath. Risk factors for coronary artery disease include dyslipidemia and post-menopausal state. Past treatments include diuretics and beta blockers. The current treatment provides mild improvement. There is no history of kidney disease, CAD/MI, CVA, heart failure or a thyroid problem. There is no history of sleep apnea.  Hyperlipidemia This is a chronic problem. The current episode started more than 1 year ago. The problem is controlled. Recent lipid tests were reviewed and are normal. She has no history of diabetes or hypothyroidism. Associated symptoms include leg pain and myalgias. Pertinent negatives include no chest pain or shortness of breath. Current antihyperlipidemic treatment includes statins. The current treatment provides moderate improvement of lipids. Risk factors for coronary artery disease include dyslipidemia, hypertension and post-menopausal.  Gastrophageal Reflux She reports no chest pain, no coughing, no heartburn or no sore throat. This is a chronic problem. The current episode started more than 1 year ago. The problem occurs rarely. The problem has been waxing and waning. The symptoms are aggravated by certain foods. Pertinent negatives include no fatigue, melena or orthopnea. She has tried a PPI for the symptoms. The treatment provided significant relief.  Anxiety Presents for follow-up visit. Symptoms include palpitations. Patient reports no chest pain, compulsions, depressed mood, irritability, nervous/anxious behavior, restlessness or shortness of breath. Symptoms occur  rarely. The severity of symptoms is mild. The symptoms are aggravated by family issues.   Her past medical history is significant for anxiety/panic attacks. There is no history of depression. Past treatments include benzodiazephines. The treatment provided mild relief.      Review of Systems  Constitutional: Negative.  Negative for irritability and fatigue.  HENT: Negative.  Negative for sore throat.   Eyes: Negative.   Respiratory: Negative.  Negative for cough and shortness of breath.   Cardiovascular: Positive for palpitations. Negative for chest pain.  Gastrointestinal: Negative.  Negative for heartburn and melena.  Endocrine: Negative.   Genitourinary: Negative.   Musculoskeletal: Positive for myalgias.  Neurological: Negative.  Negative for headaches.  Hematological: Negative.   Psychiatric/Behavioral: Negative.  The patient is not nervous/anxious.   All other systems reviewed and are negative.      Objective:   Physical Exam  Vitals reviewed. Constitutional: She is oriented to person, place, and time. She appears well-developed and well-nourished. No distress.  HENT:  Head: Normocephalic and atraumatic.  Right Ear: External ear normal.  Mouth/Throat: Oropharynx is clear and moist.  Eyes: Pupils are equal, round, and reactive to light.  Neck: Normal range of motion. Neck supple. No thyromegaly present.  Cardiovascular: Normal rate, regular rhythm, normal heart sounds and intact distal pulses.   No murmur heard. Pulmonary/Chest: Effort normal and breath sounds normal. No respiratory distress. She has no wheezes.  Abdominal: Soft. Bowel sounds are normal. She exhibits no distension. There is no tenderness.  Musculoskeletal: Normal range of motion. She exhibits edema (trace amt of swelling in right ankle). She exhibits no tenderness.  Neurological: She is alert and oriented to person, place, and time. She has normal reflexes. No cranial nerve deficit.  Skin: Skin is warm  and dry. There is erythema.  Left lower ankle mild erythemas (Pt had a bee sting a couple days ago)   Psychiatric: She has a normal mood and affect. Her behavior is normal. Judgment and thought content normal.      BP 152/79  Pulse 69  Temp(Src) 97.2 F (36.2 C) (Oral)  Ht _0  (1.6 m)  Wt 170 lb 12.8 oz (77.474 kg)  BMI 30.26 kg/m2     Assessment & Plan:  1. Essential hypertension - CMP14+EGFR - atenolol (TENORMIN) 50 MG tablet; Take 1 tablet (50 mg total) by mouth daily.  Dispense: 90 tablet; Refill: 4 - diltiazem (CARDIZEM) 120 MG tablet; Take 1 tablet (120 mg total) by mouth at bedtime.  Dispense: 90 tablet; Refill: 3 - furosemide (LASIX) 40 MG tablet; Take 1 tablet (40 mg total) by mouth 2 (two) times daily.  Dispense: 180 tablet; Refill: 3  2. Hyperlipidemia - CMP14+EGFR - Lipid panel - simvastatin (ZOCOR) 20 MG tablet; Take 1 tablet (20 mg total) by mouth at bedtime.  Dispense: 90 tablet; Refill: 4  3. GAD (generalized anxiety disorder) - CMP14+EGFR  4. Unspecified vitamin D deficiency - CMP14+EGFR - Vit D  25 hydroxy (rtn osteoporosis monitoring)  5. Gastroesophageal reflux disease without esophagitis - CMP14+EGFR - pantoprazole (PROTONIX) 40 MG tablet; Take 1 tablet (40 mg total) by mouth 2 (two) times daily.  Dispense: 180 tablet; Refill: 3  6. Anxiety state, unspecified - CMP14+EGFR - ALPRAZolam (XANAX) 0.5 MG tablet; Take 0.5 tablets (0.25 mg total) by mouth 2 (two) times daily as needed for anxiety.  Dispense: 60 tablet; Refill: 3  7. Unspecified constipation - CMP14+EGFR - lactulose (CHRONULAC) 10 GM/15ML solution; Take 30 mLs (20 g total) by mouth 2 (two) times daily as needed (constipation).  Dispense: 240 mL; Refill: 3  8. Other malaise and fatigue - Vitamin B12  9. Osteoporosis screening  - DG Bone Density; Future   Continue all meds Labs pending Health Maintenance reviewed Diet and exercise encouraged RTO 6 months  Evelina Dun,  FNP

## 2013-10-08 NOTE — Patient Instructions (Signed)

## 2013-10-09 LAB — CMP14+EGFR
A/G RATIO: 1.8 (ref 1.1–2.5)
ALT: 23 IU/L (ref 0–32)
AST: 19 IU/L (ref 0–40)
Albumin: 4.2 g/dL (ref 3.5–4.8)
Alkaline Phosphatase: 90 IU/L (ref 39–117)
BILIRUBIN TOTAL: 0.8 mg/dL (ref 0.0–1.2)
BUN/Creatinine Ratio: 19 (ref 11–26)
BUN: 15 mg/dL (ref 8–27)
CHLORIDE: 101 mmol/L (ref 97–108)
CO2: 27 mmol/L (ref 18–29)
Calcium: 9.8 mg/dL (ref 8.7–10.3)
Creatinine, Ser: 0.77 mg/dL (ref 0.57–1.00)
GFR calc Af Amer: 89 mL/min/{1.73_m2} (ref >59)
GFR, EST NON AFRICAN AMERICAN: 77 mL/min/{1.73_m2} (ref >59)
GLUCOSE: 101 mg/dL — AB (ref 65–99)
Globulin, Total: 2.3 g/dL (ref 1.5–4.5)
POTASSIUM: 5.2 mmol/L (ref 3.5–5.2)
SODIUM: 141 mmol/L (ref 134–144)
TOTAL PROTEIN: 6.5 g/dL (ref 6.0–8.5)

## 2013-10-09 LAB — VITAMIN B12: VITAMIN B 12: 773 pg/mL (ref 211–946)

## 2013-10-09 LAB — VITAMIN D 25 HYDROXY (VIT D DEFICIENCY, FRACTURES): Vit D, 25-Hydroxy: 41 ng/mL (ref 30.0–100.0)

## 2013-10-09 LAB — LIPID PANEL
CHOLESTEROL TOTAL: 187 mg/dL (ref 100–199)
Chol/HDL Ratio: 2.5 ratio units (ref 0.0–4.4)
HDL: 75 mg/dL (ref >39)
LDL Calculated: 96 mg/dL (ref 0–99)
Triglycerides: 79 mg/dL (ref 0–149)
VLDL Cholesterol Cal: 16 mg/dL (ref 5–40)

## 2013-11-05 DIAGNOSIS — M25579 Pain in unspecified ankle and joints of unspecified foot: Secondary | ICD-10-CM | POA: Diagnosis not present

## 2013-11-05 DIAGNOSIS — Z01818 Encounter for other preprocedural examination: Secondary | ICD-10-CM | POA: Diagnosis not present

## 2013-11-05 DIAGNOSIS — M2010 Hallux valgus (acquired), unspecified foot: Secondary | ICD-10-CM | POA: Diagnosis not present

## 2013-11-09 DIAGNOSIS — G894 Chronic pain syndrome: Secondary | ICD-10-CM | POA: Diagnosis not present

## 2013-11-10 DIAGNOSIS — Z23 Encounter for immunization: Secondary | ICD-10-CM | POA: Diagnosis not present

## 2013-12-01 ENCOUNTER — Ambulatory Visit: Payer: Medicare Other | Admitting: Family

## 2013-12-08 ENCOUNTER — Ambulatory Visit (INDEPENDENT_AMBULATORY_CARE_PROVIDER_SITE_OTHER): Payer: Medicare Other | Admitting: Family

## 2013-12-08 ENCOUNTER — Encounter: Payer: Self-pay | Admitting: Family

## 2013-12-08 VITALS — BP 133/69 | HR 58 | Temp 97.5°F | Ht 63.0 in | Wt 168.6 lb

## 2013-12-08 DIAGNOSIS — K5732 Diverticulitis of large intestine without perforation or abscess without bleeding: Secondary | ICD-10-CM | POA: Diagnosis not present

## 2013-12-08 DIAGNOSIS — N764 Abscess of vulva: Secondary | ICD-10-CM

## 2013-12-08 MED ORDER — POLYETHYLENE GLYCOL 3350 17 GM/SCOOP PO POWD: 17.0000 g | Freq: Two times a day (BID) | ORAL | Status: DC | PRN

## 2013-12-08 MED ORDER — PROMETHAZINE HCL 12.5 MG PO TABS: 12.5000 mg | ORAL_TABLET | Freq: Three times a day (TID) | ORAL | Status: DC | PRN

## 2013-12-08 MED ORDER — CIPROFLOXACIN HCL 500 MG PO TABS: 500.0000 mg | ORAL_TABLET | Freq: Two times a day (BID) | ORAL | Status: DC

## 2013-12-08 MED ORDER — METRONIDAZOLE 500 MG PO TABS: 500.0000 mg | ORAL_TABLET | Freq: Three times a day (TID) | ORAL | Status: DC

## 2013-12-08 NOTE — Patient Instructions (Signed)
Abscess An abscess is an infected area that contains a collection of pus and debris.It can occur in almost any part of the body. An abscess is also known as a furuncle or boil. CAUSES  An abscess occurs when tissue gets infected. This can occur from blockage of oil or sweat glands, infection of hair follicles, or a minor injury to the skin. As the body tries to fight the infection, pus collects in the area and creates pressure under the skin. This pressure causes pain. People with weakened immune systems have difficulty fighting infections and get certain abscesses more often.  SYMPTOMS Usually an abscess develops on the skin and becomes a painful mass that is red, warm, and tender. If the abscess forms under the skin, you may feel a moveable soft area under the skin. Some abscesses break open (rupture) on their own, but most will continue to get worse without care. The infection can spread deeper into the body and eventually into the bloodstream, causing you to feel ill.  DIAGNOSIS  Your caregiver will take your medical history and perform a physical exam. A sample of fluid may also be taken from the abscess to determine what is causing your infection. TREATMENT  Your caregiver may prescribe antibiotic medicines to fight the infection. However, taking antibiotics alone usually does not cure an abscess. Your caregiver may need to make a small cut (incision) in the abscess to drain the pus. In some cases, gauze is packed into the abscess to reduce pain and to continue draining the area. HOME CARE INSTRUCTIONS   Only take over-the-counter or prescription medicines for pain, discomfort, or fever as directed by your caregiver.  If you were prescribed antibiotics, take them as directed. Finish them even if you start to feel better.  If gauze is used, follow your caregiver's directions for changing the gauze.  To avoid spreading the infection:  Keep your draining abscess covered with a  bandage.  Wash your hands well.  Do not share personal care items, towels, or whirlpools with others.  Avoid skin contact with others.  Keep your skin and clothes clean around the abscess.  Keep all follow-up appointments as directed by your caregiver. SEEK MEDICAL CARE IF:   You have increased pain, swelling, redness, fluid drainage, or bleeding.  You have muscle aches, chills, or a general ill feeling.  You have a fever. MAKE SURE YOU:   Understand these instructions.  Will watch your condition.  Will get help right away if you are not doing well or get worse. Document Released: 11/21/2004 Document Revised: 08/13/2011 Document Reviewed: 04/26/2011 [facility] Patient Information 2015 [place], [place]. This information is not intended to replace advice given to you by your health care provider. Make sure you discuss any questions you have with your health care provider.  Diverticulitis Diverticulitis is inflammation or infection of small pouches in your colon that form when you have a condition called diverticulosis. The pouches in your colon are called diverticula. Your colon, or large intestine, is where water is absorbed and stool is formed. Complications of diverticulitis can include:  Bleeding.  Severe infection.  Severe pain.  Perforation of your colon.  Obstruction of your colon. CAUSES  Diverticulitis is caused by bacteria. Diverticulitis happens when stool becomes trapped in diverticula. This allows bacteria to grow in the diverticula, which can lead to inflammation and infection. RISK FACTORS People with diverticulosis are at risk for diverticulitis. Eating a diet that does not include enough fiber from fruits and vegetables  may make diverticulitis more likely to develop. SYMPTOMS  Symptoms of diverticulitis may include:  Abdominal pain and tenderness. The pain is normally located on the left side of the abdomen, but may occur in other areas.  Fever and  chills.  Bloating.  Cramping.  Nausea.  Vomiting.  Constipation.  Diarrhea.  Blood in your stool. DIAGNOSIS  Your health care provider will ask you about your medical history and do a physical exam. You may need to have tests done because many medical conditions can cause the same symptoms as diverticulitis. Tests may include:  Blood tests.  Urine tests.  Imaging tests of the abdomen, including X-rays and CT scans. When your condition is under control, your health care provider may recommend that you have a colonoscopy. A colonoscopy can show how severe your diverticula are and whether something else is causing your symptoms. TREATMENT  Most cases of diverticulitis are mild and can be treated at home. Treatment may include:  Taking over-the-counter pain medicines.  Following a clear liquid diet.  Taking antibiotic medicines by mouth for 7-10 days. More severe cases may be treated at a hospital. Treatment may include:  Not eating or drinking.  Taking prescription pain medicine.  Receiving antibiotic medicines through an IV tube.  Receiving fluids and nutrition through an IV tube.  Surgery. HOME CARE INSTRUCTIONS   Follow your health care provider's instructions carefully.  Follow a full liquid diet or other diet as directed by your health care provider. After your symptoms improve, your health care provider may tell you to change your diet. He or she may recommend you eat a high-fiber diet. Fruits and vegetables are good sources of fiber. Fiber makes it easier to pass stool.  Take fiber supplements or probiotics as directed by your health care provider.  Only take medicines as directed by your health care provider.  Keep all your follow-up appointments. SEEK MEDICAL CARE IF:   Your pain does not improve.  You have a hard time eating food.  Your bowel movements do not return to normal. SEEK IMMEDIATE MEDICAL CARE IF:   Your pain becomes worse.  Your  symptoms do not get better.  Your symptoms suddenly get worse.  You have a fever.  You have repeated vomiting.  You have bloody or black, tarry stools. MAKE SURE YOU:   Understand these instructions.  Will watch your condition.  Will get help right away if you are not doing well or get worse. Document Released: 11/21/2004 Document Revised: 02/16/2013 Document Reviewed: 01/06/2013 [facility] Patient Information 2015 [place], [place]. This information is not intended to replace advice given to you by your health care provider. Make sure you discuss any questions you have with your health care provider.

## 2013-12-08 NOTE — Progress Notes (Signed)
   Subjective:    Patient ID: Kaitlyn Serrano, female    DOB: April 30, 1940, 73 y.o.   MRN: [id number]  HPI Pt presents for an abscess/cyst on her labia  that she noticed was a "little sore" a few months ago, but over the last couple has gotten bigger. Pt states she hasn't tried anything for it.  Pt has a diverticulitis and states she has a "flare up" that started last night. PT states she has been constant cramping pain 9 out 10 pain. Pt states she has been having constipation.    Review of Systems  Constitutional: Negative.   HENT: Negative.   Eyes: Negative.   Respiratory: Negative.  Negative for shortness of breath.   Cardiovascular: Negative.  Negative for palpitations.  Gastrointestinal: Negative.   Endocrine: Negative.   Genitourinary: Negative.   Musculoskeletal: Negative.   Neurological: Negative.  Negative for headaches.  Hematological: Negative.   Psychiatric/Behavioral: Negative.   All other systems reviewed and are negative.      Objective:   Physical Exam  Vitals reviewed. Constitutional: She is oriented to person, place, and time. She appears well-developed and well-nourished. No distress.  HENT:  Head: Normocephalic and atraumatic.  Right Ear: External ear normal.  Left Ear: External ear normal.  Nose: Nose normal.  Mouth/Throat: Oropharynx is clear and moist.  Eyes: Pupils are equal, round, and reactive to light.  Neck: Normal range of motion. Neck supple. No thyromegaly present.  Cardiovascular: Normal rate, regular rhythm, normal heart sounds and intact distal pulses.   No murmur heard. Pulmonary/Chest: Effort normal and breath sounds normal. No respiratory distress. She has no wheezes.  Abdominal: Soft. Bowel sounds are normal. She exhibits no distension. There is tenderness (Mild tenderness in LLQ).  Genitourinary:  Small erythemas hard abscess on left labia   Musculoskeletal: Normal range of motion. She exhibits no edema and no tenderness.    Neurological: She is alert and oriented to person, place, and time. She has normal reflexes. No cranial nerve deficit.  Skin: Skin is warm and dry.  Psychiatric: She has a normal mood and affect. Her behavior is normal. Judgment and thought content normal.    BP 133/69  Pulse 58  Temp(Src) 97.5 F (36.4 C) (Oral)  Ht 5\' 3"  (1.6 m)  Wt 168 lb 9.6 oz (76.476 kg)  BMI 29.87 kg/m2       Assessment & Plan:  1. Diverticulitis of colon without hemorrhage -Take meds as prescribed -Force fluids - promethazine (PHENERGAN) 12.5 MG tablet; Take 1 tablet (12.5 mg total) by mouth every 8 (eight) hours as needed for nausea.  Dispense: 30 tablet; Refill: 1 - polyethylene glycol powder (GLYCOLAX/MIRALAX) powder; Take 17 g by mouth 2 (two) times daily as needed.  Dispense: 3350 g; Refill: 11 - metroNIDAZOLE (FLAGYL) 500 MG tablet; Take 1 tablet (500 mg total) by mouth 3 (three) times daily.  Dispense: 21 tablet; Refill: 0 - ciprofloxacin (CIPRO) 500 MG tablet; Take 1 tablet (500 mg total) by mouth 2 (two) times daily.  Dispense: 14 tablet; Refill: 0  2. Abscess of left genital labia -Warm compresses -Sitz bath -RTO prn  Evelina Dun, FNP

## 2013-12-10 DIAGNOSIS — M542 Cervicalgia: Secondary | ICD-10-CM | POA: Diagnosis not present

## 2013-12-10 DIAGNOSIS — M546 Pain in thoracic spine: Secondary | ICD-10-CM | POA: Diagnosis not present

## 2013-12-10 DIAGNOSIS — M545 Low back pain: Secondary | ICD-10-CM | POA: Diagnosis not present

## 2013-12-10 DIAGNOSIS — G894 Chronic pain syndrome: Secondary | ICD-10-CM | POA: Diagnosis not present

## 2013-12-27 ENCOUNTER — Encounter: Payer: Self-pay | Admitting: Family

## 2013-12-29 ENCOUNTER — Encounter: Payer: Self-pay | Admitting: Podiatry

## 2013-12-29 ENCOUNTER — Ambulatory Visit (INDEPENDENT_AMBULATORY_CARE_PROVIDER_SITE_OTHER): Payer: Medicare Other | Admitting: Podiatry

## 2013-12-29 ENCOUNTER — Ambulatory Visit (INDEPENDENT_AMBULATORY_CARE_PROVIDER_SITE_OTHER): Payer: Medicare Other

## 2013-12-29 VITALS — BP 174/79 | HR 57 | Resp 15 | Ht 63.0 in | Wt 161.0 lb

## 2013-12-29 DIAGNOSIS — L600 Ingrowing nail: Secondary | ICD-10-CM

## 2013-12-29 DIAGNOSIS — M79674 Pain in right toe(s): Secondary | ICD-10-CM

## 2013-12-29 DIAGNOSIS — M2021 Hallux rigidus, right foot: Secondary | ICD-10-CM | POA: Diagnosis not present

## 2013-12-29 NOTE — Patient Instructions (Addendum)

## 2013-12-29 NOTE — Progress Notes (Signed)
Subjective:    Patient ID: Kaitlyn Serrano, female    DOB: May 03, 1940, 74 y.o.   MRN: [id number]  HPI Comments: Kaitlyn Serrano, 73 year old female, presents the office today with complaints of right big toe joint pain and decreased range of motion. She states that this is been ongoing for awhile, although has become increasly painful over the last several months. She states that she is previously scheduled for surgery for what sounds to be an arthrodesis of the right first MTPJ however the patient did not follow through with surgery, and would like a second opinion. She states that she has not tried any inserts. She denies any recent injury or trauma to the area.  She also has secondary complaints of right big toe nail pain along the inside border of the nail. She states of the area is tender to palpation and particularly with shoe gear. She has noticed periodically there is increased redness around the area. She denies any recent drainage from around the area. She states that she previously has had the nail border removed however the nail grew back. No other complaints at this time.   Foot Pain Associated symptoms include arthralgias and myalgias.      Review of Systems  Constitutional: Positive for appetite change.  Gastrointestinal: Positive for constipation.  Endocrine: Positive for heat intolerance.  Musculoskeletal: Positive for myalgias and arthralgias.  Allergic/Immunologic: Positive for food allergies.       Peanuts, popcorn  Hematological: Bruises/bleeds easily.  All other systems reviewed and are negative.      Objective:   Physical Exam AAO x3, NAD DP/PT pulses palpable bilaterally, CRT less than 3 seconds Protective sensation intact with Simms Weinstein monofilament, vibratory sensation intact, Achilles tendon reflex intact Tenderness to palpation of the right hallux medial border of the nail. There is evidence of ingrowing along the nail border. There is no surrounding  erythema, ascending cellulitis, drainage. There is mild edema over the area. No tenderness along the lateral nail border. Decreased range of motion of the right first MTPJ and pain with range of motion. No overlying edema, erythema, increased warmth over the area. Decrease in medial arch height upon weightbearing. MMT 5/5, ROM WNL No calf pain with compression, swelling, warmth, erythema.  No open lesions.        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 -x-rays were obtained and reviewed with the patient. -For the ingrown toenail risks and, occasions a partial nail avulsion with chemical matricectomy was discussed with the patient for which she understands and wishes to proceed with the procedure. Under sterile conditions a total of 2.5 mL of a one-to-one mixture of 2% lidocaine plain and 0.5% Marcaine plain was infiltrated in a hallux block fashion. Once anesthetized the right hallux is prepped in sterile fashion. A tourniquet was applied. Next the heel border of the right hallux nail was then sharply excised making sure to remove the entire offending nail border. There is noted to be in a significant amount of ingrowing along the nail border. No purulence was identified. Once the nail was removed phenol was applied under standard conditions and the area was copiously irrigated. Silvadene was applied followed by a dry sterile dressing. After application a dressing the tourniquet was removed and there is noted to be an immediate capillary refill time noted to the digit. Patient tolerated the procedure well without complications. Post procedure instructions were discussed with the patient for which  she verbally understood. Monitoring clinical signs or symptoms of infection and directed to call the office immediately if any are to occur or go directly to the emergency room. -For right hallux rigidus conservative versus  surgical treatment were discussed. At this time the patient states that she would like to proceed with an insert for her shoe which I discussed with her with a Morton's extension. A prescription was given to the patient for her to go to [facility]otech to inquire about having an insert made. I also did discuss with her various surgical options which included a Keller  bunionectomy versus arthrodesis. She will consider her options. -Follow-up in one week for nail check or sooner if any problems are to arise. In the meantime call the office with any questions, concerns, change in symptoms.

## 2014-01-05 ENCOUNTER — Ambulatory Visit: Payer: Medicare Other | Admitting: Podiatry

## 2014-01-10 DIAGNOSIS — I10 Essential (primary) hypertension: Secondary | ICD-10-CM | POA: Diagnosis not present

## 2014-01-10 DIAGNOSIS — Z79891 Long term (current) use of opiate analgesic: Secondary | ICD-10-CM | POA: Diagnosis not present

## 2014-01-10 DIAGNOSIS — Z79899 Other long term (current) drug therapy: Secondary | ICD-10-CM | POA: Diagnosis not present

## 2014-01-10 DIAGNOSIS — Z6829 Body mass index (BMI) 29.0-29.9, adult: Secondary | ICD-10-CM | POA: Diagnosis not present

## 2014-01-10 DIAGNOSIS — G894 Chronic pain syndrome: Secondary | ICD-10-CM | POA: Diagnosis not present

## 2014-01-14 ENCOUNTER — Ambulatory Visit (INDEPENDENT_AMBULATORY_CARE_PROVIDER_SITE_OTHER): Payer: Medicare Other | Admitting: Podiatry

## 2014-01-14 ENCOUNTER — Encounter: Payer: Self-pay | Admitting: Podiatry

## 2014-01-14 VITALS — BP 160/83 | HR 59 | Resp 14

## 2014-01-14 DIAGNOSIS — L600 Ingrowing nail: Secondary | ICD-10-CM

## 2014-01-14 NOTE — Patient Instructions (Addendum)
Continue epsom salt soaks twice a day followed by antibiotic ointment and a band-aid. Can leave uncovered at night. Follow up in 2 weeks if not completely healed, or call sooner if you have any problems/concerns.  Monitor for any signs/symptoms of infection. Call the office immediately if any occur or go directly to the emergency room. Call with any questions/concerns.

## 2014-01-17 NOTE — Progress Notes (Signed)
Patient ID: Kaitlyn Serrano, female   DOB: 23-Feb-1941, 73 y.o.   MRN: [id number]  Subjective: 73 year old female returns the office they for 1 week follow-up evaluation status post right medial hallux symptomatically ingrown toenail with chemical matricectomy. Patient states that she has decreased pain to the area compared to prior. She denies any redness or drainage from the site. She has stopped covering the area with antibiotic ointment and a Band-Aid. She denies any systemic complaints as fevers, chills, nausea, vomiting. No other complaints at this time. No acute changes since last appointment.  Objective: AAO x3, NAD DP/PT pulses palpable bilaterally, CRT less than 3 seconds Protective sensation intact with Simms Weinstein monofilament, vibratory sensation intact Post right medial hallux partial nail avulsion which is healing appropriately at this timeframe. There is a small opening within the proximal medial nail border. There is no drainage identified. No surrounding erythema or ascending cellulitis. No areas of fluctuance or crepitus. No tenderness to palpation over the site. No malodor. No pain with calf compression, swelling, warmth, erythema.  Assessment: 73 year old female 1 week status post right medial hallux partial nail avulsion with chemical matricectomy.  Plan: -Treatment options discussed including alternatives, risks, competitions. -At this time the site is healing appropriately without any signs of infection. Continue soaking the foot twice a day and Epsom salts followed by antibiotic ointment and a Band-Aid. Can leave uncovered at night. Monitoring clinical signs or symptoms of infection and directed to call the office immediately if any are to occur or go to the emergency room. -Follow-up in 2 weeks. In the meantime, call the office with any questions, concerns, change in symptoms.

## 2014-01-26 ENCOUNTER — Other Ambulatory Visit: Payer: Self-pay | Admitting: Family Medicine

## 2014-01-26 DIAGNOSIS — Z1231 Encounter for screening mammogram for malignant neoplasm of breast: Secondary | ICD-10-CM

## 2014-02-02 ENCOUNTER — Ambulatory Visit: Payer: Medicare Other | Admitting: Podiatry

## 2014-02-04 ENCOUNTER — Ambulatory Visit (HOSPITAL_COMMUNITY)
Admission: RE | Admit: 2014-02-04 | Discharge: 2014-02-04 | Disposition: A | Discharge status: Home / Self Care | Payer: Medicare Other | Source: Ambulatory Visit | Attending: Family Medicine | Admitting: Family Medicine

## 2014-02-04 ENCOUNTER — Ambulatory Visit (HOSPITAL_COMMUNITY)
Admission: RE | Admit: 2014-02-04 | Discharge: 2014-02-04 | Disposition: A | Discharge status: Home / Self Care | Payer: Medicare Other | Source: Ambulatory Visit | Attending: Family | Admitting: Family

## 2014-02-04 DIAGNOSIS — Z13820 Encounter for screening for osteoporosis: Secondary | ICD-10-CM | POA: Diagnosis not present

## 2014-02-04 DIAGNOSIS — Z780 Asymptomatic menopausal state: Secondary | ICD-10-CM | POA: Insufficient documentation

## 2014-02-04 DIAGNOSIS — Z1231 Encounter for screening mammogram for malignant neoplasm of breast: Secondary | ICD-10-CM

## 2014-02-21 ENCOUNTER — Encounter: Payer: Self-pay | Admitting: Family Medicine

## 2014-02-21 ENCOUNTER — Ambulatory Visit (INDEPENDENT_AMBULATORY_CARE_PROVIDER_SITE_OTHER): Payer: Medicare Other | Admitting: Family Medicine

## 2014-02-21 VITALS — BP 111/66 | HR 67 | Temp 97.8°F | Ht 63.0 in | Wt 169.0 lb

## 2014-02-21 DIAGNOSIS — J206 Acute bronchitis due to rhinovirus: Secondary | ICD-10-CM

## 2014-02-21 MED ORDER — HYDROCODONE-HOMATROPINE 5-1.5 MG/5ML PO SYRP: 5.0000 mL | ORAL_SOLUTION | Freq: Three times a day (TID) | ORAL | Status: DC | PRN

## 2014-02-21 MED ORDER — AZITHROMYCIN 250 MG PO TABS: ORAL_TABLET | ORAL | Status: DC

## 2014-02-21 MED ORDER — METHYLPREDNISOLONE ACETATE 80 MG/ML IJ SUSP
80.0000 mg | Freq: Once | INTRAMUSCULAR | Status: AC
  Administered 2014-02-21: 80 mg via INTRAMUSCULAR

## 2014-02-21 NOTE — Addendum Note (Signed)
Addended by: Marin Olp on: 02/21/2014 05:29 PM   Modules accepted: Miquel Dunn

## 2014-02-21 NOTE — Progress Notes (Signed)
   Subjective:    Patient ID: Kaitlyn Serrano, female    DOB: 09/05/1940, 74 y.o.   MRN: [id number]  HPI Patient is here with c/o uri sx's and cough.  Review of Systems  Constitutional: Negative for fever.  HENT: Negative for ear pain.   Eyes: Negative for discharge.  Respiratory: Negative for cough.   Cardiovascular: Negative for chest pain.  Gastrointestinal: Negative for abdominal distention.  Endocrine: Negative for polyuria.  Genitourinary: Negative for difficulty urinating.  Musculoskeletal: Negative for gait problem and neck pain.  Skin: Negative for color change and rash.  Neurological: Negative for speech difficulty and headaches.  Psychiatric/Behavioral: Negative for agitation.       Objective:    BP 111/66 mmHg  Pulse 67  Temp(Src) 97.8 F (36.6 C) (Oral)  Ht 5\' 3"  (1.6 m)  Wt 169 lb (76.658 kg)  BMI 29.94 kg/m2 Physical Exam  Constitutional: She is oriented to person, place, and time. She appears well-developed and well-nourished.  HENT:  Head: Normocephalic and atraumatic.  Mouth/Throat: Oropharynx is clear and moist.  Eyes: Pupils are equal, round, and reactive to light.  Neck: Normal range of motion. Neck supple.  Cardiovascular: Normal rate and regular rhythm.   No murmur heard. Pulmonary/Chest: Effort normal and breath sounds normal.  Abdominal: Soft. Bowel sounds are normal. There is no tenderness.  Neurological: She is alert and oriented to person, place, and time.  Skin: Skin is warm and dry.  Psychiatric: She has a normal mood and affect.          Assessment & Plan:     ICD-9-CM ICD-10-CM   1. Acute bronchitis due to Rhinovirus 466.0 J20.6 azithromycin (ZITHROMAX) 250 MG tablet   079.3  methylPREDNISolone acetate (DEPO-MEDROL) injection 80 mg     HYDROcodone-homatropine (HYCODAN) 5-1.5 MG/5ML syrup   Push po fluids, rest, tylenol and motrin otc prn as directed for fever, arthralgias, and myalgias.  Follow up prn if sx's continue or  persist.  No Follow-up on file.  Lysbeth Penner FNP

## 2014-02-28 ENCOUNTER — Telehealth: Payer: Self-pay | Admitting: Family Medicine

## 2014-02-28 DIAGNOSIS — Z6829 Body mass index (BMI) 29.0-29.9, adult: Secondary | ICD-10-CM | POA: Diagnosis not present

## 2014-02-28 DIAGNOSIS — M545 Low back pain: Secondary | ICD-10-CM | POA: Diagnosis not present

## 2014-02-28 DIAGNOSIS — I10 Essential (primary) hypertension: Secondary | ICD-10-CM | POA: Diagnosis not present

## 2014-02-28 DIAGNOSIS — M810 Age-related osteoporosis without current pathological fracture: Secondary | ICD-10-CM | POA: Diagnosis not present

## 2014-02-28 DIAGNOSIS — G894 Chronic pain syndrome: Secondary | ICD-10-CM | POA: Diagnosis not present

## 2014-02-28 NOTE — Telephone Encounter (Signed)
Pt given appt 03/02/14 at 12:30 with [facility].

## 2014-03-02 ENCOUNTER — Ambulatory Visit (INDEPENDENT_AMBULATORY_CARE_PROVIDER_SITE_OTHER): Payer: Medicare Other | Admitting: Family Medicine

## 2014-03-02 ENCOUNTER — Encounter: Payer: Self-pay | Admitting: Family Medicine

## 2014-03-02 ENCOUNTER — Ambulatory Visit: Payer: Medicare Other | Admitting: Podiatry

## 2014-03-02 VITALS — BP 151/77 | HR 61 | Temp 96.8°F | Ht 63.0 in | Wt 166.6 lb

## 2014-03-02 DIAGNOSIS — K5900 Constipation, unspecified: Secondary | ICD-10-CM | POA: Diagnosis not present

## 2014-03-02 DIAGNOSIS — I10 Essential (primary) hypertension: Secondary | ICD-10-CM | POA: Diagnosis not present

## 2014-03-02 DIAGNOSIS — J069 Acute upper respiratory infection, unspecified: Secondary | ICD-10-CM | POA: Diagnosis not present

## 2014-03-02 MED ORDER — CIPROFLOXACIN HCL 500 MG PO TABS: 500.0000 mg | ORAL_TABLET | Freq: Two times a day (BID) | ORAL | Status: DC

## 2014-03-02 MED ORDER — LACTULOSE 10 GM/15ML PO SOLN: 20.0000 g | Freq: Two times a day (BID) | ORAL | Status: DC | PRN

## 2014-03-02 MED ORDER — DILTIAZEM HCL 120 MG PO TABS: ORAL_TABLET | ORAL | Status: DC

## 2014-03-02 NOTE — Progress Notes (Signed)
   Subjective:    Patient ID: Kaitlyn Serrano, female    DOB: 07/29/1940, 74 y.o.   MRN: [id number]  HPI C/o uri sx's and constipation.  She c/o having elevated bp for last few months and it is elevated today.  Review of Systems  Constitutional: Negative for fever.  HENT: Negative for ear pain.   Eyes: Negative for discharge.  Respiratory: Negative for cough.   Cardiovascular: Negative for chest pain.  Gastrointestinal: Negative for abdominal distention.  Endocrine: Negative for polyuria.  Genitourinary: Negative for difficulty urinating.  Musculoskeletal: Negative for gait problem and neck pain.  Skin: Negative for color change and rash.  Neurological: Negative for speech difficulty and headaches.  Psychiatric/Behavioral: Negative for agitation.       Objective:    BP 151/77 mmHg  Pulse 61  Temp(Src) 96.8 F (36 C) (Oral)  Ht 5\' 3"  (1.6 m)  Wt 166 lb 9.6 oz (75.569 kg)  BMI 29.52 kg/m2 Physical Exam  Constitutional: She is oriented to person, place, and time. She appears well-developed and well-nourished.  HENT:  Head: Normocephalic and atraumatic.  Mouth/Throat: Oropharynx is clear and moist.  Eyes: Pupils are equal, round, and reactive to light.  Neck: Normal range of motion. Neck supple.  Cardiovascular: Normal rate and regular rhythm.   No murmur heard. Pulmonary/Chest: Effort normal and breath sounds normal.  Abdominal: Soft. Bowel sounds are normal. There is no tenderness.  Neurological: She is alert and oriented to person, place, and time.  Skin: Skin is warm and dry.  Psychiatric: She has a normal mood and affect.          Assessment & Plan:     ICD-9-CM ICD-10-CM   1. Constipation, unspecified constipation type 564.00 K59.00 lactulose (CHRONULAC) 10 GM/15ML solution     DISCONTINUED: lactulose (CHRONULAC) 10 GM/15ML solution  2. URI (upper respiratory infection) 465.9 J06.9 ciprofloxacin (CIPRO) 500 MG tablet  3. Essential hypertension 401.9 I10  diltiazem (CARDIZEM) 120 MG tablet     Return in about 3 months (around 06/01/2014).  Lysbeth Penner FNP

## 2014-03-15 ENCOUNTER — Encounter: Payer: Self-pay | Admitting: Family Medicine

## 2014-03-15 ENCOUNTER — Ambulatory Visit (INDEPENDENT_AMBULATORY_CARE_PROVIDER_SITE_OTHER): Payer: Medicare Other

## 2014-03-15 ENCOUNTER — Ambulatory Visit (INDEPENDENT_AMBULATORY_CARE_PROVIDER_SITE_OTHER): Payer: Medicare Other | Admitting: Family Medicine

## 2014-03-15 VITALS — BP 155/82 | HR 71 | Temp 96.8°F | Ht 63.0 in | Wt 164.2 lb

## 2014-03-15 DIAGNOSIS — R05 Cough: Secondary | ICD-10-CM | POA: Diagnosis not present

## 2014-03-15 DIAGNOSIS — R059 Cough, unspecified: Secondary | ICD-10-CM

## 2014-03-15 DIAGNOSIS — J206 Acute bronchitis due to rhinovirus: Secondary | ICD-10-CM | POA: Diagnosis not present

## 2014-03-15 MED ORDER — PREDNISONE 10 MG PO TABS: ORAL_TABLET | ORAL | Status: DC

## 2014-03-15 MED ORDER — LEVOFLOXACIN 500 MG PO TABS: 500.0000 mg | ORAL_TABLET | Freq: Every day | ORAL | Status: DC

## 2014-03-15 MED ORDER — HYDROCODONE-HOMATROPINE 5-1.5 MG/5ML PO SYRP: 5.0000 mL | ORAL_SOLUTION | Freq: Three times a day (TID) | ORAL | Status: DC | PRN

## 2014-03-15 NOTE — Progress Notes (Signed)
   Subjective:    Patient ID: Kaitlyn Serrano, female    DOB: 01/17/1941, 74 y.o.   MRN: [id number]  HPI Patient is here for c/o persistent cough and uri sx's.  Review of Systems  Constitutional: Negative for fever.  HENT: Negative for ear pain.   Eyes: Negative for discharge.  Respiratory: Negative for cough.   Cardiovascular: Negative for chest pain.  Gastrointestinal: Negative for abdominal distention.  Endocrine: Negative for polyuria.  Genitourinary: Negative for difficulty urinating.  Musculoskeletal: Negative for gait problem and neck pain.  Skin: Negative for color change and rash.  Neurological: Negative for speech difficulty and headaches.  Psychiatric/Behavioral: Negative for agitation.       Objective:    BP 155/82 mmHg  Pulse 71  Temp(Src) 96.8 F (36 C) (Oral)  Ht 5\' 3"  (1.6 m)  Wt 164 lb 3.2 oz (74.481 kg)  BMI 29.09 kg/m2 Physical Exam  Constitutional: She is oriented to person, place, and time. She appears well-developed and well-nourished.  HENT:  Head: Normocephalic and atraumatic.  Mouth/Throat: Oropharynx is clear and moist.  Eyes: Pupils are equal, round, and reactive to light.  Neck: Normal range of motion. Neck supple.  Cardiovascular: Normal rate and regular rhythm.   No murmur heard. Pulmonary/Chest: Effort normal and breath sounds normal.  Abdominal: Soft. Bowel sounds are normal. There is no tenderness.  Neurological: She is alert and oriented to person, place, and time.  Skin: Skin is warm and dry.  Psychiatric: She has a normal mood and affect.     cxr -      Assessment & Plan:     ICD-9-CM ICD-10-CM   1. Cough 786.2 R05 DG Chest 2 View     levofloxacin (LEVAQUIN) 500 MG tablet     HYDROcodone-homatropine (HYCODAN) 5-1.5 MG/5ML syrup     predniSONE (DELTASONE) 10 MG tablet  2. Acute bronchitis due to Rhinovirus 466.0 J20.6 levofloxacin (LEVAQUIN) 500 MG tablet   079.3  HYDROcodone-homatropine (HYCODAN) 5-1.5 MG/5ML syrup   predniSONE (DELTASONE) 10 MG tablet     No Follow-up on file.  Lysbeth Penner FNP

## 2014-03-16 ENCOUNTER — Ambulatory Visit: Payer: Medicare Other | Admitting: Podiatry

## 2014-03-21 ENCOUNTER — Telehealth: Payer: Self-pay | Admitting: *Deleted

## 2014-03-21 NOTE — Telephone Encounter (Signed)
Patient is complaining with left arm and shoulder pain. Patient states that it has been going on for 3-4 days. Patient states she is not having any chest pain, no SOB. Patient is going to back Dr tomorrow she states it is coming from her back.

## 2014-03-22 DIAGNOSIS — M4712 Other spondylosis with myelopathy, cervical region: Secondary | ICD-10-CM | POA: Diagnosis not present

## 2014-03-22 DIAGNOSIS — M25512 Pain in left shoulder: Secondary | ICD-10-CM | POA: Diagnosis not present

## 2014-03-23 ENCOUNTER — Ambulatory Visit: Payer: Medicare Other | Admitting: *Deleted

## 2014-03-23 VITALS — BP 151/76 | HR 72

## 2014-03-23 DIAGNOSIS — D1801 Hemangioma of skin and subcutaneous tissue: Secondary | ICD-10-CM | POA: Diagnosis not present

## 2014-03-23 DIAGNOSIS — D485 Neoplasm of uncertain behavior of skin: Secondary | ICD-10-CM | POA: Diagnosis not present

## 2014-03-23 DIAGNOSIS — I10 Essential (primary) hypertension: Secondary | ICD-10-CM

## 2014-03-23 DIAGNOSIS — D225 Melanocytic nevi of trunk: Secondary | ICD-10-CM | POA: Diagnosis not present

## 2014-03-23 DIAGNOSIS — D1800 Hemangioma unspecified site: Secondary | ICD-10-CM | POA: Diagnosis not present

## 2014-03-23 NOTE — Progress Notes (Signed)
Pt walked into office today to check BP. She had been at the pharmacy and her BP reading was 185/92, BP here was 156/71 with a pulse of 72.

## 2014-03-28 ENCOUNTER — Other Ambulatory Visit: Payer: Self-pay | Admitting: Orthopaedic Surgery

## 2014-03-28 DIAGNOSIS — M25512 Pain in left shoulder: Secondary | ICD-10-CM

## 2014-03-30 ENCOUNTER — Ambulatory Visit
Admission: RE | Admit: 2014-03-30 | Discharge: 2014-03-30 | Disposition: A | Discharge status: Home / Self Care | Payer: Medicare Other | Source: Ambulatory Visit | Attending: Orthopaedic Surgery | Admitting: Orthopaedic Surgery

## 2014-03-30 DIAGNOSIS — M25412 Effusion, left shoulder: Secondary | ICD-10-CM | POA: Diagnosis not present

## 2014-03-30 DIAGNOSIS — M7582 Other shoulder lesions, left shoulder: Secondary | ICD-10-CM | POA: Diagnosis not present

## 2014-03-30 DIAGNOSIS — M25512 Pain in left shoulder: Secondary | ICD-10-CM

## 2014-04-08 ENCOUNTER — Other Ambulatory Visit: Payer: Medicare Other

## 2014-04-14 DIAGNOSIS — L089 Local infection of the skin and subcutaneous tissue, unspecified: Secondary | ICD-10-CM | POA: Diagnosis not present

## 2014-04-14 DIAGNOSIS — D485 Neoplasm of uncertain behavior of skin: Secondary | ICD-10-CM | POA: Diagnosis not present

## 2014-04-17 ENCOUNTER — Encounter (HOSPITAL_COMMUNITY): Payer: Self-pay | Admitting: *Deleted

## 2014-04-17 ENCOUNTER — Emergency Department (HOSPITAL_COMMUNITY)
Admission: EM | Admit: 2014-04-17 | Discharge: 2014-04-17 | Disposition: A | Discharge status: Home / Self Care | Payer: Medicare Other | Attending: Emergency Medicine | Admitting: Emergency Medicine

## 2014-04-17 ENCOUNTER — Emergency Department (HOSPITAL_COMMUNITY): Payer: Medicare Other

## 2014-04-17 DIAGNOSIS — M79602 Pain in left arm: Secondary | ICD-10-CM | POA: Diagnosis not present

## 2014-04-17 DIAGNOSIS — M25512 Pain in left shoulder: Secondary | ICD-10-CM | POA: Diagnosis present

## 2014-04-17 DIAGNOSIS — I10 Essential (primary) hypertension: Secondary | ICD-10-CM | POA: Insufficient documentation

## 2014-04-17 DIAGNOSIS — K219 Gastro-esophageal reflux disease without esophagitis: Secondary | ICD-10-CM | POA: Insufficient documentation

## 2014-04-17 DIAGNOSIS — R0602 Shortness of breath: Secondary | ICD-10-CM | POA: Diagnosis not present

## 2014-04-17 DIAGNOSIS — Z8669 Personal history of other diseases of the nervous system and sense organs: Secondary | ICD-10-CM | POA: Diagnosis not present

## 2014-04-17 DIAGNOSIS — Z87891 Personal history of nicotine dependence: Secondary | ICD-10-CM | POA: Insufficient documentation

## 2014-04-17 DIAGNOSIS — Z79899 Other long term (current) drug therapy: Secondary | ICD-10-CM | POA: Insufficient documentation

## 2014-04-17 DIAGNOSIS — Z880 Allergy status to penicillin: Secondary | ICD-10-CM | POA: Diagnosis not present

## 2014-04-17 DIAGNOSIS — E780 Pure hypercholesterolemia: Secondary | ICD-10-CM | POA: Diagnosis not present

## 2014-04-17 LAB — I-STAT TROPONIN, ED: Troponin i, poc: 0.01 ng/mL (ref 0.00–0.08)

## 2014-04-17 LAB — BASIC METABOLIC PANEL
ANION GAP: 10 (ref 5–15)
BUN: 8 mg/dL (ref 6–23)
CO2: 27 mmol/L (ref 19–32)
CREATININE: 0.89 mg/dL (ref 0.50–1.10)
Calcium: 9.7 mg/dL (ref 8.4–10.5)
Chloride: 101 mmol/L (ref 96–112)
GFR calc Af Amer: 73 mL/min — ABNORMAL LOW (ref >90)
GFR, EST NON AFRICAN AMERICAN: 63 mL/min — AB (ref >90)
Glucose, Bld: 110 mg/dL — ABNORMAL HIGH (ref 70–99)
POTASSIUM: 4.2 mmol/L (ref 3.5–5.1)
SODIUM: 138 mmol/L (ref 135–145)

## 2014-04-17 LAB — CBC
HEMATOCRIT: 41.8 % (ref 36.0–46.0)
HEMOGLOBIN: 14.2 g/dL (ref 12.0–15.0)
MCH: 30.1 pg (ref 26.0–34.0)
MCHC: 34 g/dL (ref 30.0–36.0)
MCV: 88.7 fL (ref 78.0–100.0)
Platelets: 254 10*3/uL (ref 150–400)
RBC: 4.71 MIL/uL (ref 3.87–5.11)
RDW: 13.1 % (ref 11.5–15.5)
WBC: 8.3 10*3/uL (ref 4.0–10.5)

## 2014-04-17 MED ORDER — HYDROMORPHONE HCL 1 MG/ML IJ SOLN
1.0000 mg | Freq: Once | INTRAMUSCULAR | Status: AC
  Administered 2014-04-17: 1 mg via INTRAVENOUS
  Filled 2014-04-17: qty 1

## 2014-04-17 MED ORDER — KETOROLAC TROMETHAMINE 30 MG/ML IJ SOLN
30.0000 mg | Freq: Once | INTRAMUSCULAR | Status: AC
  Administered 2014-04-17: 30 mg via INTRAVENOUS
  Filled 2014-04-17: qty 1

## 2014-04-17 MED ORDER — OXYCODONE-ACETAMINOPHEN 5-325 MG PO TABS: 1.0000 | ORAL_TABLET | Freq: Four times a day (QID) | ORAL | Status: DC | PRN

## 2014-04-17 NOTE — ED Notes (Signed)
Pt c/o left arm pain since this morning. Pt denies any recent injury to left arm. Pt states it feels like someone is beating her arm. Pain is constant but varies with intensity. Pt also reports some shortness of breath.

## 2014-04-17 NOTE — Discharge Instructions (Signed)
Fill the prescription for prednisone you have previously been prescribed. Percocet as prescribed today as needed for pain.  Follow-up with your neurosurgeon to discuss whether you require further imaging or intervention.   Shoulder Pain The shoulder is the joint that connects your arm to your body. Muscles and band-like tissues that connect bones to muscles (tendons) hold the joint together. Shoulder pain is felt if an injury or medical problem affects one or more parts of the shoulder. HOME CARE   Put ice on the sore area.  Put ice in a plastic bag.  Place a towel between your skin and the bag.  Leave the ice on for 15-20 minutes, 03-04 times a day for the first 2 days.  Stop using cold packs if they do not help with the pain.  If you were given something to keep your shoulder from moving (sling; shoulder immobilizer), wear it as told. Only take it off to shower or bathe.  Move your arm as little as possible, but keep your hand moving to prevent puffiness (swelling).  Squeeze a soft ball or foam pad as much as possible to help prevent swelling.  Take medicine as told by your doctor. GET HELP IF:  You have progressing new pain in your arm, hand, or fingers.  Your hand or fingers get cold.  Your medicine does not help lessen your pain. GET HELP RIGHT AWAY IF:   Your arm, hand, or fingers are numb or tingling.  Your arm, hand, or fingers are puffy (swollen), painful, or turn white or blue. MAKE SURE YOU:   Understand these instructions.  Will watch your condition.  Will get help right away if you are not doing well or get worse. Document Released: 07/31/2007 Document Revised: 06/28/2013 Document Reviewed: 08/26/2011 [facility] Patient Information 2015 [place], [place]. This information is not intended to replace advice given to you by your health care provider. Make sure you discuss any questions you have with your health care provider.

## 2014-04-17 NOTE — ED Provider Notes (Signed)
CSN: [id number]     Arrival date & time 04/17/14  1849 History   First MD Initiated Contact with Patient 04/17/14 1937     Chief Complaint  Patient presents with  . Arm Pain     (Consider location/radiation/quality/duration/timing/severity/associated sxs/prior Treatment) HPI Comments: Patient is a 74 year old female with past medical history of hypertension, high cholesterol. He presents today with complaints of left shoulder pain that has been bothering her for approximately one month. This began in the absence of any injury or trauma. It became much worse this morning and presents for further evaluation of it. She has been seen by her primary Dr. and has undergone an MRI of her shoulder revealing degenerative changes.  Patient is a 74 y.o. female presenting with arm pain. The history is provided by the patient.  Arm Pain This is a new problem. Episode onset: One month ago. The problem occurs constantly. The problem has been gradually worsening. Pertinent negatives include no chest pain and no shortness of breath. Nothing aggravates the symptoms. Nothing relieves the symptoms. Treatments tried: Pain medication. The treatment provided no relief.    Past Medical History  Diagnosis Date  . Sinus infection   . Hypertension   . High cholesterol   . Insomnia   . Blood transfusion   . GERD (gastroesophageal reflux disease)   . Hemorrhoids   . Glaucoma   . Diverticulosis    Past Surgical History  Procedure Laterality Date  . Cervical spine surgery    . Back surgery     Family History  Problem Relation Age of Onset  . Diabetes Mother   . Heart disease Mother   . Hypertension Mother   . Heart disease Father    History  Substance Use Topics  . Smoking status: Former Smoker -- 4.00 packs/day for 3 years    Types: Cigarettes    Quit date: 02/20/1964  . Smokeless tobacco: Former Systems developer  . Alcohol Use: No   OB History    Gravida Para Term Preterm AB TAB SAB Ectopic Multiple Living    4 3 3  1   1  3      Review of Systems  Respiratory: Negative for shortness of breath.   Cardiovascular: Negative for chest pain.  All other systems reviewed and are negative.     Allergies  Bee venom; Elavil; and Penicillins  Home Medications   Prior to Admission medications   Medication Sig Start Date End Date Taking? Authorizing Provider  ALPRAZolam Duanne Moron) 0.5 MG tablet Take 0.5 tablets (0.25 mg total) by mouth 2 (two) times daily as needed for anxiety. 10/08/13   Sharion Balloon, FNP  atenolol (TENORMIN) 50 MG tablet Take 1 tablet (50 mg total) by mouth daily. 10/08/13   Sharion Balloon, FNP  cyclobenzaprine (FLEXERIL) 10 MG tablet Take 10 mg by mouth 2 (two) times daily as needed for muscle spasms.    Historical Provider, MD  diltiazem (CARDIZEM) 120 MG tablet Take 2 po qhs 03/02/14   Lysbeth Penner, FNP  EPINEPHrine 0.3 mg/0.3 mL IJ SOAJ injection Inject 0.3 mLs (0.3 mg total) into the muscle once. 10/06/13   Lysbeth Penner, FNP  Estradiol Acetate (FEMRING) 0.05 MG/24HR RING Place 0.05 each vaginally once. 10/08/13   Sharion Balloon, FNP  furosemide (LASIX) 40 MG tablet Take 1 tablet (40 mg total) by mouth 2 (two) times daily. 10/08/13   Sharion Balloon, FNP  HYDROcodone-acetaminophen (NORCO) 10-325 MG per tablet Take 1 tablet by  mouth every 6 (six) hours as needed. pain    Historical Provider, MD  HYDROcodone-homatropine (HYCODAN) 5-1.5 MG/5ML syrup Take 5 mLs by mouth every 8 (eight) hours as needed for cough. 03/15/14   Lysbeth Penner, FNP  hydrOXYzine (ATARAX/VISTARIL) 25 MG tablet Take 1 tablet (25 mg total) by mouth 3 (three) times daily as needed. 10/06/13   Lysbeth Penner, FNP  lactulose (CHRONULAC) 10 GM/15ML solution Take 30 mLs (20 g total) by mouth 2 (two) times daily as needed (constipation). 03/02/14   Lysbeth Penner, FNP  levofloxacin (LEVAQUIN) 500 MG tablet Take 1 tablet (500 mg total) by mouth daily. 03/15/14   Lysbeth Penner, FNP  pantoprazole (PROTONIX) 40  MG tablet Take 1 tablet (40 mg total) by mouth 2 (two) times daily. 10/08/13   Sharion Balloon, FNP  polyethylene glycol powder (GLYCOLAX/MIRALAX) powder Take 17 g by mouth 2 (two) times daily as needed. 12/08/13   Sharion Balloon, FNP  predniSONE (DELTASONE) 10 MG tablet Take 4 po qd x 2 days then 2 po qd x 2 day then stop 03/15/14   Lysbeth Penner, FNP  promethazine (PHENERGAN) 12.5 MG tablet Take 1 tablet (12.5 mg total) by mouth every 8 (eight) hours as needed for nausea. 12/08/13   Sharion Balloon, FNP  simvastatin (ZOCOR) 20 MG tablet Take 1 tablet (20 mg total) by mouth at bedtime. 10/08/13   Sharion Balloon, FNP  traZODone (DESYREL) 50 MG tablet Take 1 tablet (50 mg total) by mouth at bedtime. 10/08/13   Sharion Balloon, FNP  Vitamin D, Ergocalciferol, (DRISDOL) 50000 UNITS CAPS capsule Take 1 capsule (50,000 Units total) by mouth every 7 (seven) days. 06/25/13   Lysbeth Penner, FNP   BP 169/72 mmHg  Pulse 74  Temp(Src) 98.2 F (36.8 C) (Oral)  Resp 18  Ht 5\' 3"  (1.6 m)  Wt 160 lb (72.576 kg)  BMI 28.35 kg/m2  SpO2 98% Physical Exam  Constitutional: She is oriented to person, place, and time. She appears well-developed and well-nourished. No distress.  HENT:  Head: Normocephalic and atraumatic.  Neck: Normal range of motion. Neck supple.  Cardiovascular: Normal rate and regular rhythm.  Exam reveals no gallop and no friction rub.   No murmur heard. Pulmonary/Chest: Effort normal and breath sounds normal. No respiratory distress. She has no wheezes.  Abdominal: Soft. Bowel sounds are normal. She exhibits no distension. There is no tenderness.  Musculoskeletal: Normal range of motion.  Patient has full range of motion of the left shoulder. There is no obvious abnormality. Distal ulnar and radial pulses are easily palpable. She is able to flex, extend, and oppose all fingers. Sensation is intact to the dorsum of the entire hand.  Neurological: She is alert and oriented to person,  place, and time.  Skin: Skin is warm and dry. She is not diaphoretic.  Nursing note and vitals reviewed.   ED Course  Procedures (including critical care time) Labs Review Labs Reviewed  [place], ED    Imaging Review No results found.   Date: 04/17/2014  Rate: 64  Rhythm: normal sinus rhythm  QRS Axis: left  Intervals: normal  ST/T Wave abnormalities: normal  Conduction Disutrbances:none  Narrative Interpretation:   Old EKG Reviewed: none available    MDM   Final diagnoses:  None    Patient is a 74 year old female who presents with left shoulder and arm pain. 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 Her strength and reflexes are symmetrical in both upper extremities and pulses and sensation are intact. Workup reveals a normal EKG with negative troponin. I highly doubt a cardiac etiology. She may well have a cervical radiculopathy that may warrant further investigation. She is to follow-up with her neurosurgeon. She tells me she has prescriptions for prednisone that have been prescribed in the past, however she has not filled these due to concerns over side effects. I've advised her to fill this prescription and try taking it. She will also be prescribed Percocet to take as needed for pain.    Veryl Speak, MD 04/17/14 2124

## 2014-04-19 DIAGNOSIS — H903 Sensorineural hearing loss, bilateral: Secondary | ICD-10-CM | POA: Diagnosis not present

## 2014-04-19 DIAGNOSIS — H9313 Tinnitus, bilateral: Secondary | ICD-10-CM | POA: Diagnosis not present

## 2014-04-26 DIAGNOSIS — M47892 Other spondylosis, cervical region: Secondary | ICD-10-CM | POA: Diagnosis not present

## 2014-04-26 DIAGNOSIS — H4011X1 Primary open-angle glaucoma, mild stage: Secondary | ICD-10-CM | POA: Diagnosis not present

## 2014-04-26 DIAGNOSIS — G894 Chronic pain syndrome: Secondary | ICD-10-CM | POA: Diagnosis not present

## 2014-04-26 DIAGNOSIS — H40051 Ocular hypertension, right eye: Secondary | ICD-10-CM | POA: Diagnosis not present

## 2014-04-26 DIAGNOSIS — M5106 Intervertebral disc disorders with myelopathy, lumbar region: Secondary | ICD-10-CM | POA: Diagnosis not present

## 2014-04-26 DIAGNOSIS — Z961 Presence of intraocular lens: Secondary | ICD-10-CM | POA: Diagnosis not present

## 2014-04-26 DIAGNOSIS — H26491 Other secondary cataract, right eye: Secondary | ICD-10-CM | POA: Diagnosis not present

## 2014-04-27 DIAGNOSIS — Z4802 Encounter for removal of sutures: Secondary | ICD-10-CM | POA: Diagnosis not present

## 2014-04-29 ENCOUNTER — Encounter: Payer: Self-pay | Admitting: Podiatry

## 2014-04-29 ENCOUNTER — Ambulatory Visit (INDEPENDENT_AMBULATORY_CARE_PROVIDER_SITE_OTHER): Payer: Medicare Other | Admitting: Podiatry

## 2014-04-29 VITALS — BP 138/69 | HR 54 | Resp 11

## 2014-04-29 DIAGNOSIS — L84 Corns and callosities: Secondary | ICD-10-CM

## 2014-04-29 DIAGNOSIS — M2021 Hallux rigidus, right foot: Secondary | ICD-10-CM | POA: Diagnosis not present

## 2014-05-01 NOTE — Progress Notes (Signed)
Patient ID: Kaitlyn Serrano, female   DOB: 07-Jul-1940, 74 y.o.   MRN: [id number]  Subjective: 74 year old female returns the office they for follow-up evaluation of right first MTPJ pain. She also states that she has some recurrence of pain along the toenail on the inside border. She denies any redness or drainage along the nail site. She reports pain with first MTPJ range of motion. She was unable to get the inserts made due to cost. Denies any recent injury or trauma. No other complaints at this time. Denies any systemic complaints as fevers, chills, nausea, vomiting.  Objective: AAO 3, NAD DP/PT pulses palpable, CRT less than 3 seconds Protective sensation intact with Simms Weinstein monofilament, vibratory sensation intact, Achilles tendon reflex intact. Along the right first MTPJ there is painful range of motion. There is exostosis palpable off the dorsal aspect of the joint. There is limitation of motion in dorsiflexion. On the medial aspect of the right hallux toenail there is mild tenderness to palpation. Over the site of tenderness there is a thick hyperkeratotic lesion. Upon debridement of the hyperkeratotic tissue there was resolution of symptoms. Upon debridement of the hyperkeratotic lesion there is no underlying ulceration, drainage, or other clinical signs of infection. There is no overlying edema, erythema, increase in warmth and there is no drainage or purulence. No other areas of tenderness to bilateral lower extremities. No open lesions or pre-ulcerative lesions are identified. No pain with calf compression, swelling, warmth, erythema.  Assessment: 74 year old female with symptomatic right hallux limitus; symptomatic hyperkeratotic tissue within the medial nail border from prior nail avulsion  Plan: -Treatment options were discussed with the patient lying alternatives, risks, complications. -At this time the patient is requesting a steroid injection into the first MTPJ due to  the pain. Risks and complications of the injection were discussed for which she understands verbally consents. Under sterile conditions a total of 1.5 mL of a mixture of dexamethasone phosphate and 2% lidocaine plain was infiltrated into and around the first MTPJ. A Band-Aid was applied. Patient tolerated the injection well without any complications. Post injection care was discussed the patient. -I discussed with the patient orthotics with custom and over-the-counter as well as a graphite insert. She wishes to hold off on this. -Hyperkeratotic tissue is sharply debrided without complications to the medial border of the right hallux toenail. -Follow-up as needed. In the meantime, encouraged to call the office with any questions, concerns, change in symptoms.

## 2014-05-02 DIAGNOSIS — H26491 Other secondary cataract, right eye: Secondary | ICD-10-CM | POA: Diagnosis not present

## 2014-05-05 DIAGNOSIS — M2011 Hallux valgus (acquired), right foot: Secondary | ICD-10-CM | POA: Diagnosis not present

## 2014-05-05 DIAGNOSIS — M25571 Pain in right ankle and joints of right foot: Secondary | ICD-10-CM | POA: Diagnosis not present

## 2014-05-10 ENCOUNTER — Telehealth: Payer: Self-pay | Admitting: Family Medicine

## 2014-05-10 NOTE — Telephone Encounter (Signed)
Appointment scheduled for 3/16 with Evelina Dun, FNP

## 2014-05-11 ENCOUNTER — Encounter: Payer: Self-pay | Admitting: Family

## 2014-05-11 ENCOUNTER — Ambulatory Visit (INDEPENDENT_AMBULATORY_CARE_PROVIDER_SITE_OTHER): Payer: Medicare Other | Admitting: Family

## 2014-05-11 VITALS — BP 137/79 | HR 70 | Temp 97.4°F | Ht 63.0 in | Wt 165.4 lb

## 2014-05-11 DIAGNOSIS — J069 Acute upper respiratory infection, unspecified: Secondary | ICD-10-CM | POA: Diagnosis not present

## 2014-05-11 DIAGNOSIS — R05 Cough: Secondary | ICD-10-CM

## 2014-05-11 DIAGNOSIS — R059 Cough, unspecified: Secondary | ICD-10-CM

## 2014-05-11 MED ORDER — AZITHROMYCIN 250 MG PO TABS: ORAL_TABLET | ORAL | Status: DC

## 2014-05-11 MED ORDER — HYDROCODONE-HOMATROPINE 5-1.5 MG/5ML PO SYRP: 5.0000 mL | ORAL_SOLUTION | Freq: Three times a day (TID) | ORAL | Status: DC | PRN

## 2014-05-11 MED ORDER — BENZONATATE 200 MG PO CAPS: 200.0000 mg | ORAL_CAPSULE | Freq: Three times a day (TID) | ORAL | Status: DC | PRN

## 2014-05-11 NOTE — Progress Notes (Signed)
Subjective:    Patient ID: [name], female    DOB: 08-08-1940, 74 y.o.   MRN: [id number]  Cough This is a new problem. The current episode started in the past 7 days. The problem has been waxing and waning. The problem occurs every few minutes. The cough is productive of purulent sputum. Associated symptoms include chills, ear pain, a fever, headaches, myalgias, nasal congestion, rhinorrhea, a sore throat, shortness of breath and wheezing. The symptoms are aggravated by lying down. She has tried rest (zyrtec) for the symptoms. The treatment provided mild relief. There is no history of asthma or COPD.      Review of Systems  Constitutional: Positive for fever and chills.  HENT: Positive for ear pain, rhinorrhea and sore throat.   Eyes: Negative.   Respiratory: Positive for cough, shortness of breath and wheezing.   Cardiovascular: Negative.  Negative for palpitations.  Gastrointestinal: Negative.   Endocrine: Negative.   Genitourinary: Negative.   Musculoskeletal: Positive for myalgias.  Neurological: Positive for headaches.  Hematological: Negative.   Psychiatric/Behavioral: Negative.   All other systems reviewed and are negative.      Objective:   Physical Exam  Constitutional: She is oriented to person, place, and time. She appears well-developed and well-nourished. No distress.  HENT:  Head: Normocephalic and atraumatic.  Right Ear: External ear normal.  Left Ear: External ear normal.  Nasal passage erythemas with mild swelling  Oropharynx erythemas   Eyes: Pupils are equal, round, and reactive to light.  Neck: Normal range of motion. Neck supple. No thyromegaly present.  Cardiovascular: Normal rate, regular rhythm, normal heart sounds and intact distal pulses.   No murmur heard. Pulmonary/Chest: Effort normal and breath sounds normal. No respiratory distress. She has no wheezes.  Abdominal: Soft. Bowel sounds are normal. She exhibits no distension. There is  no tenderness.  Musculoskeletal: Normal range of motion. She exhibits no edema or tenderness.  Neurological: She is alert and oriented to person, place, and time. She has normal reflexes. No cranial nerve deficit.  Skin: Skin is warm and dry.  Psychiatric: She has a normal mood and affect. Her behavior is normal. Judgment and thought content normal.  Vitals reviewed.    BP 137/79 mmHg  Pulse 70  Temp(Src) 97.4 F (36.3 C) (Oral)  Ht 5\' 3"  (1.6 m)  Wt 165 lb 6.4 oz (75.025 kg)  BMI 29.31 kg/m2      Assessment & Plan:  1. Cough - benzonatate (TESSALON) 200 MG capsule; Take 1 capsule (200 mg total) by mouth 3 (three) times daily as needed.  Dispense: 30 capsule; Refill: 1 - HYDROcodone-homatropine (HYCODAN) 5-1.5 MG/5ML syrup; Take 5 mLs by mouth every 8 (eight) hours as needed for cough.  Dispense: 120 mL; Refill: 0  3. Acute upper respiratory infection -- Take meds as prescribed - Use a cool mist humidifier  -Use saline nose sprays frequently -Saline irrigations of the nose can be very helpful if done frequently.  * 4X daily for 1 week*  * Use of a nettie pot can be helpful with this. Follow directions with this* -Force fluids -For any cough or congestion  Use plain Mucinex- regular strength or max strength is fine   * Children- consult with Pharmacist for dosing -For fever or aces or pains- take tylenol or ibuprofen appropriate for age and weight.  * for fevers greater than 101 orally you may alternate ibuprofen and tylenol every  3 hours. -Throat lozenges if help - azithromycin (ZITHROMAX)  250 MG tablet; Take 500 mg once, then 250 mg for four days  Dispense: 6 tablet; Refill: 0 - benzonatate (TESSALON) 200 MG capsule; Take 1 capsule (200 mg total) by mouth 3 (three) times daily as needed.  Dispense: 30 capsule; Refill: 1 - HYDROcodone-homatropine (HYCODAN) 5-1.5 MG/5ML syrup; Take 5 mLs by mouth every 8 (eight) hours as needed for cough.  Dispense: 120 mL; Refill:  0  Evelina Dun, FNP

## 2014-05-11 NOTE — Patient Instructions (Signed)
Upper Respiratory Infection, Adult An upper respiratory infection (URI) is also sometimes known as the common cold. The upper respiratory tract includes the nose, sinuses, throat, trachea, and bronchi. Bronchi are the airways leading to the lungs. Most people improve within 1 week, but symptoms can last up to 2 weeks. A residual cough may last even longer.  CAUSES Many different viruses can infect the tissues lining the upper respiratory tract. The tissues become irritated and inflamed and often become very moist. Mucus production is also common. A cold is contagious. You can easily spread the virus to others by oral contact. This includes kissing, sharing a glass, coughing, or sneezing. Touching your mouth or nose and then touching a surface, which is then touched by another person, can also spread the virus. SYMPTOMS  Symptoms typically develop 1 to 3 days after you come in contact with a cold virus. Symptoms vary from person to person. They may include:  Runny nose.  Sneezing.  Nasal congestion.  Sinus irritation.  Sore throat.  Loss of voice (laryngitis).  Cough.  Fatigue.  Muscle aches.  Loss of appetite.  Headache.  Low-grade fever. DIAGNOSIS  You might diagnose your own cold based on familiar symptoms, since most people get a cold 2 to 3 times a year. Your caregiver can confirm this based on your exam. Most importantly, your caregiver can check that your symptoms are not due to another disease such as strep throat, sinusitis, pneumonia, asthma, or epiglottitis. Blood tests, throat tests, and X-rays are not necessary to diagnose a common cold, but they may sometimes be helpful in excluding other more serious diseases. Your caregiver will decide if any further tests are required. RISKS AND COMPLICATIONS  You may be at risk for a more severe case of the common cold if you smoke cigarettes, have chronic heart disease (such as heart failure) or lung disease (such as asthma), or if  you have a weakened immune system. The very young and very old are also at risk for more serious infections. Bacterial sinusitis, middle ear infections, and bacterial pneumonia can complicate the common cold. The common cold can worsen asthma and chronic obstructive pulmonary disease (COPD). Sometimes, these complications can require emergency medical care and may be life-threatening. PREVENTION  The best way to protect against getting a cold is to practice good hygiene. Avoid oral or hand contact with people with cold symptoms. Wash your hands often if contact occurs. There is no clear evidence that vitamin C, vitamin E, echinacea, or exercise reduces the chance of developing a cold. However, it is always recommended to get plenty of rest and practice good nutrition. TREATMENT  Treatment is directed at relieving symptoms. There is no cure. Antibiotics are not effective, because the infection is caused by a virus, not by bacteria. Treatment may include:  Increased fluid intake. Sports drinks offer valuable electrolytes, sugars, and fluids.  Breathing heated mist or steam (vaporizer or shower).  Eating chicken soup or other clear broths, and maintaining good nutrition.  Getting plenty of rest.  Using gargles or lozenges for comfort.  Controlling fevers with ibuprofen or acetaminophen as directed by your caregiver.  Increasing usage of your inhaler if you have asthma. Zinc gel and zinc lozenges, taken in the first 24 hours of the common cold, can shorten the duration and lessen the severity of symptoms. Pain medicines may help with fever, muscle aches, and throat pain. A variety of non-prescription medicines are available to treat congestion and runny nose. Your caregiver   can make recommendations and may suggest nasal or lung inhalers for other symptoms.  HOME CARE INSTRUCTIONS   Only take over-the-counter or prescription medicines for pain, discomfort, or fever as directed by your  caregiver.  Use a warm mist humidifier or inhale steam from a shower to increase air moisture. This may keep secretions moist and make it easier to breathe.  Drink enough water and fluids to keep your urine clear or pale yellow.  Rest as needed.  Return to work when your temperature has returned to normal or as your caregiver advises. You may need to stay home longer to avoid infecting others. You can also use a face mask and careful hand washing to prevent spread of the virus. SEEK MEDICAL CARE IF:   After the first few days, you feel you are getting worse rather than better.  You need your caregiver's advice about medicines to control symptoms.  You develop chills, worsening shortness of breath, or brown or red sputum. These may be signs of pneumonia.  You develop yellow or brown nasal discharge or pain in the face, especially when you bend forward. These may be signs of sinusitis.  You develop a fever, swollen neck glands, pain with swallowing, or white areas in the back of your throat. These may be signs of strep throat. SEEK IMMEDIATE MEDICAL CARE IF:   You have a fever.  You develop severe or persistent headache, ear pain, sinus pain, or chest pain.  You develop wheezing, a prolonged cough, cough up blood, or have a change in your usual mucus (if you have chronic lung disease).  You develop sore muscles or a stiff neck. Document Released: 08/07/2000 Document Revised: 05/06/2011 Document Reviewed: 05/19/2013 [employer] Patient Information 2015 [employer]. This information is not intended to replace advice given to you by your health care provider. Make sure you discuss any questions you have with your health care provider.  

## 2014-05-30 DIAGNOSIS — M25571 Pain in right ankle and joints of right foot: Secondary | ICD-10-CM | POA: Diagnosis not present

## 2014-05-30 DIAGNOSIS — Z79899 Other long term (current) drug therapy: Secondary | ICD-10-CM | POA: Diagnosis not present

## 2014-05-30 DIAGNOSIS — M2021 Hallux rigidus, right foot: Secondary | ICD-10-CM | POA: Diagnosis not present

## 2014-05-30 DIAGNOSIS — M7751 Other enthesopathy of right foot: Secondary | ICD-10-CM | POA: Diagnosis not present

## 2014-06-02 ENCOUNTER — Encounter: Payer: Self-pay | Admitting: *Deleted

## 2014-06-02 ENCOUNTER — Encounter: Payer: Self-pay | Admitting: Cardiology

## 2014-06-02 ENCOUNTER — Ambulatory Visit (INDEPENDENT_AMBULATORY_CARE_PROVIDER_SITE_OTHER): Payer: Medicare Other | Admitting: Cardiology

## 2014-06-02 ENCOUNTER — Other Ambulatory Visit: Payer: Self-pay | Admitting: Family

## 2014-06-02 VITALS — BP 174/100 | HR 79 | Ht 62.0 in | Wt 166.8 lb

## 2014-06-02 DIAGNOSIS — I10 Essential (primary) hypertension: Secondary | ICD-10-CM | POA: Diagnosis not present

## 2014-06-02 DIAGNOSIS — E785 Hyperlipidemia, unspecified: Secondary | ICD-10-CM | POA: Diagnosis not present

## 2014-06-02 DIAGNOSIS — R079 Chest pain, unspecified: Secondary | ICD-10-CM

## 2014-06-02 MED ORDER — AMLODIPINE BESYLATE 10 MG PO TABS: 10.0000 mg | ORAL_TABLET | Freq: Every day | ORAL | Status: DC

## 2014-06-02 NOTE — Progress Notes (Signed)
Clinical Summary Kaitlyn Serrano is a 74 y.o.female seen today for follow up of the following medical problems.   1 Chest pain - previous workup in 2012 per old [place] notes with negative myoview for ischemia. Echo showed normal LV function - recently has been having sharp pain in left chest, 8/10. +SOB. DOE with activities has worsened, example cannot do all her vacuuming at once.   2. HTN - does not check regulalrly at home -compliant with meds, has not taken meds yet today.   3. Hyperlipidemia - compliant with simvastatin - 09/2013 TC 187 TG 79 HDL 75 LDL 96   Past Medical History  Diagnosis Date  . Sinus infection   . Hypertension   . High cholesterol   . Insomnia   . Blood transfusion   . GERD (gastroesophageal reflux disease)   . Hemorrhoids   . Glaucoma   . Diverticulosis      Allergies  Allergen Reactions  . Bee Venom Anaphylaxis  . Elavil [Amitriptyline]   . Penicillins Other (See Comments)    "PEELS SKIN OFF MOUTH"     Current Outpatient Prescriptions  Medication Sig Dispense Refill  . ALPRAZolam (XANAX) 0.5 MG tablet Take 0.5 tablets (0.25 mg total) by mouth 2 (two) times daily as needed for anxiety. 60 tablet 3  . atenolol (TENORMIN) 50 MG tablet Take 1 tablet (50 mg total) by mouth daily. 90 tablet 4  . azithromycin (ZITHROMAX) 250 MG tablet Take 500 mg once, then 250 mg for four days 6 tablet 0  . benzonatate (TESSALON) 200 MG capsule Take 1 capsule (200 mg total) by mouth 3 (three) times daily as needed. 30 capsule 1  . cyclobenzaprine (FLEXERIL) 10 MG tablet Take 10 mg by mouth 2 (two) times daily as needed for muscle spasms.    Marland Kitchen diltiazem (CARDIZEM) 120 MG tablet Take 2 po qhs (Patient taking differently: Take 120 mg by mouth daily. ) 180 tablet 3  . EPINEPHrine 0.3 mg/0.3 mL IJ SOAJ injection Inject 0.3 mLs (0.3 mg total) into the muscle once. 2 Device 1  . Estradiol Acetate (FEMRING) 0.05 MG/24HR RING Place 0.05 each vaginally once. 3 each 4   . furosemide (LASIX) 40 MG tablet Take 1 tablet (40 mg total) by mouth 2 (two) times daily. (Patient taking differently: Take 40 mg by mouth daily. ) 180 tablet 3  . HYDROcodone-acetaminophen (NORCO) 10-325 MG per tablet Take 1 tablet by mouth every 6 (six) hours as needed. pain    . HYDROcodone-homatropine (HYCODAN) 5-1.5 MG/5ML syrup Take 5 mLs by mouth every 8 (eight) hours as needed for cough. 120 mL 0  . hydrOXYzine (ATARAX/VISTARIL) 25 MG tablet Take 1 tablet (25 mg total) by mouth 3 (three) times daily as needed. 30 tablet 1  . lactulose (CHRONULAC) 10 GM/15ML solution Take 30 mLs (20 g total) by mouth 2 (two) times daily as needed (constipation). 240 mL 3  . oxyCODONE-acetaminophen (PERCOCET) 5-325 MG per tablet Take 1-2 tablets by mouth every 6 (six) hours as needed. 20 tablet 0  . pantoprazole (PROTONIX) 40 MG tablet Take 1 tablet (40 mg total) by mouth 2 (two) times daily. 180 tablet 3  . polyethylene glycol powder (GLYCOLAX/MIRALAX) powder Take 17 g by mouth 2 (two) times daily as needed. 3350 g 11  . predniSONE (DELTASONE) 10 MG tablet Take 4 po qd x 2 days then 2 po qd x 2 day then stop 14 tablet 0  . promethazine (PHENERGAN) 12.5 MG tablet  Take 1 tablet (12.5 mg total) by mouth every 8 (eight) hours as needed for nausea. 30 tablet 1  . simvastatin (ZOCOR) 20 MG tablet Take 1 tablet (20 mg total) by mouth at bedtime. 90 tablet 4  . traZODone (DESYREL) 50 MG tablet Take 1 tablet (50 mg total) by mouth at bedtime. 30 tablet 2  . Vitamin D, Ergocalciferol, (DRISDOL) 50000 UNITS CAPS capsule Take 1 capsule (50,000 Units total) by mouth every 7 (seven) days. 18 capsule 2   No current facility-administered medications for this visit.     Past Surgical History  Procedure Laterality Date  . Cervical spine surgery    . Back surgery       Allergies  Allergen Reactions  . Bee Venom Anaphylaxis  . Elavil [Amitriptyline]   . Penicillins Other (See Comments)    "PEELS SKIN OFF MOUTH"        Family History  Problem Relation Age of Onset  . Diabetes Mother   . Heart disease Mother   . Hypertension Mother   . Heart disease Father      Social History Kaitlyn Serrano reports that she quit smoking about 50 years ago. Her smoking use included Cigarettes. She has a 12 pack-year smoking history. She has quit using smokeless tobacco. Kaitlyn Serrano reports that she does not drink alcohol.   Review of Systems CONSTITUTIONAL: No weight loss, fever, chills, weakness or fatigue.  HEENT: Eyes: No visual loss, blurred vision, double vision or yellow sclerae.No hearing loss, sneezing, congestion, runny nose or sore throat.  SKIN: No rash or itching.  CARDIOVASCULAR: per HPI RESPIRATORY: No cough or sputum.  GASTROINTESTINAL: No anorexia, nausea, vomiting or diarrhea. No abdominal pain or blood.  GENITOURINARY: No burning on urination, no polyuria NEUROLOGICAL: No headache, dizziness, syncope, paralysis, ataxia, numbness or tingling in the extremities. No change in bowel or bladder control.  MUSCULOSKELETAL: No muscle, back pain, joint pain or stiffness.  LYMPHATICS: No enlarged nodes. No history of splenectomy.  PSYCHIATRIC: No history of depression or anxiety.  ENDOCRINOLOGIC: No reports of sweating, cold or heat intolerance. No polyuria or polydipsia.  Marland Kitchen   Physical Examination p 79 bp 174/100 Wt 166 lbs BMI 31 Gen: resting comfortably, no acute distress HEENT: no scleral icterus, pupils equal round and reactive, no palptable cervical adenopathy,  CV: RRR, no m/r/g, no JVD, no carotid brutis Resp: Clear to auscultation bilaterally GI: abdomen is soft, non-tender, non-distended, normal bowel sounds, no hepatosplenomegaly MSK: extremities are warm, no edema.  Skin: warm, no rash Neuro:  no focal deficits Psych: appropriate affect   Diagnostic Studies Echo 04/2010 [facility] LVEF >82%, grade I diastolic dysfunction, mild MR  04/2010 ExerciseMyoview No ischemia. Exercised 8  minutes, 9 METs, 95% THR.  12/2007 Renal artery [place] No stenosis, normal kidney size.   06/01/13 Clinic EKG NSR, LAD     Assessment and Plan   1. Chest pain - progressing in severity, has also has worsening DOE over the last few weeks - obtain exercise cardiolite  2. HTN - elevated in clinic, however she has not taken her meds yet. From prior recent visits bp ranged from [id number] systolic - has been on atenolol and dilt for several years by her previous provider. Did not tolerate higher doses of dilt due to side effects. Stop dilt and start norvasc 10mg  daily.   3. Hyperlipidemia - at goal, continue current meds  F/u 4 weeks.    Arnoldo Lenis, M.D.

## 2014-06-02 NOTE — Patient Instructions (Signed)
Your physician recommends that you schedule a follow-up appointment in: [place] DR. Cochran  Your physician has recommended you make the following change in your medication:   STOP TAKING CARDIZEM   START TAKING AMLODIPINE 10 MG DAILY  CONTINUE ALL OTHER MEDICATIONS AS DIRECTED  Your physician has requested that you have en exercise stress myoview. For further information please visit [email or web address]. Please follow instruction sheet, as given.  YOU WILL NEED TO HOLD ATENOLOL THE MORNING OF YOUR TEST  Thank you for choosing [facility]!!

## 2014-06-03 NOTE — Telephone Encounter (Signed)
Called into pharmacy

## 2014-06-03 NOTE — Telephone Encounter (Signed)
Last filled 01/25/14. Route to nurse if approved, call into [facility]

## 2014-06-07 ENCOUNTER — Ambulatory Visit (HOSPITAL_COMMUNITY)
Admission: RE | Admit: 2014-06-07 | Discharge: 2014-06-07 | Disposition: A | Discharge status: Home / Self Care | Payer: Medicare Other | Source: Ambulatory Visit | Attending: Cardiology | Admitting: Cardiology

## 2014-06-07 ENCOUNTER — Encounter (HOSPITAL_COMMUNITY): Payer: Self-pay

## 2014-06-07 ENCOUNTER — Encounter (HOSPITAL_COMMUNITY)
Admission: RE | Admit: 2014-06-07 | Discharge: 2014-06-07 | Disposition: A | Discharge status: Home / Self Care | Payer: Medicare Other | Source: Ambulatory Visit | Attending: Cardiology | Admitting: Cardiology

## 2014-06-07 DIAGNOSIS — R0609 Other forms of dyspnea: Secondary | ICD-10-CM | POA: Insufficient documentation

## 2014-06-07 DIAGNOSIS — R079 Chest pain, unspecified: Secondary | ICD-10-CM | POA: Insufficient documentation

## 2014-06-07 MED ORDER — TECHNETIUM TC 99M SESTAMIBI - CARDIOLITE
10.0000 | Freq: Once | INTRAVENOUS | Status: AC | PRN
  Administered 2014-06-07: 10 via INTRAVENOUS

## 2014-06-07 MED ORDER — SODIUM CHLORIDE 0.9 % IJ SOLN
INTRAMUSCULAR | Status: AC
  Administered 2014-06-07: 10 mL via INTRAVENOUS
  Filled 2014-06-07: qty 3

## 2014-06-07 MED ORDER — SODIUM CHLORIDE 0.9 % IJ SOLN
INTRAMUSCULAR | Status: AC
  Filled 2014-06-07: qty 36

## 2014-06-07 MED ORDER — TECHNETIUM TC 99M SESTAMIBI GENERIC - CARDIOLITE
30.0000 | Freq: Once | INTRAVENOUS | Status: AC | PRN
  Administered 2014-06-07: 30 via INTRAVENOUS

## 2014-06-07 MED ORDER — SODIUM CHLORIDE 0.9 % IJ SOLN
10.0000 mL | INTRAMUSCULAR | Status: DC | PRN
  Administered 2014-06-07: 10 mL via INTRAVENOUS
  Filled 2014-06-07: qty 10

## 2014-06-07 MED ORDER — REGADENOSON 0.4 MG/5ML IV SOLN
INTRAVENOUS | Status: AC
  Filled 2014-06-07: qty 5

## 2014-06-07 NOTE — Progress Notes (Signed)
Stress Lab Nurses Notes - Kaitlyn Serrano 06/07/2014 Reason for doing test: Chest Pain and DOE Type of test: Stress Cardiolite Nurse performing test: Gerrit Halls, RN Nuclear Medicine Tech: Melburn Hake Echo Tech: Not Applicable MD performing test: S. McDowell/K.Purcell Nails NP Family MD: Dr. Laurance Flatten Test explained and consent signed: Yes.   IV started: Saline lock flushed, No redness or edema and Saline lock started in radiology Symptoms: SOB Treatment/Intervention: None Reason test stopped: fatigue and reached target HR After recovery IV was: Discontinued via X-ray tech and No redness or edema Patient to return to Nuc. Med at : 12:00 Patient discharged: Home Patient's Condition upon discharge was: stable Comments: During test peak BP 189/57 & HR 130.  Recovery BP 141/77 & HR 91.  Symptoms resolved in recovery. [name] T

## 2014-06-10 ENCOUNTER — Telehealth: Payer: Self-pay | Admitting: *Deleted

## 2014-06-10 NOTE — Telephone Encounter (Signed)
Pt made aware, forwarded to Dr. Luan Pulling

## 2014-06-10 NOTE — Telephone Encounter (Signed)
-----   Message from Arnoldo Lenis, MD sent at 06/09/2014 12:45 PM EDT ----- Stress test is normal, no evidence of any blockages in the arteries of her heart. Will discuss in more detail at f/u  Zandra Abts MD

## 2014-06-14 DIAGNOSIS — M205X1 Other deformities of toe(s) (acquired), right foot: Secondary | ICD-10-CM | POA: Diagnosis not present

## 2014-06-14 DIAGNOSIS — M25571 Pain in right ankle and joints of right foot: Secondary | ICD-10-CM | POA: Diagnosis not present

## 2014-06-14 DIAGNOSIS — M79671 Pain in right foot: Secondary | ICD-10-CM | POA: Diagnosis not present

## 2014-06-14 DIAGNOSIS — I10 Essential (primary) hypertension: Secondary | ICD-10-CM | POA: Diagnosis not present

## 2014-06-14 DIAGNOSIS — M13871 Other specified arthritis, right ankle and foot: Secondary | ICD-10-CM | POA: Diagnosis not present

## 2014-06-14 DIAGNOSIS — M25871 Other specified joint disorders, right ankle and foot: Secondary | ICD-10-CM | POA: Diagnosis not present

## 2014-06-14 DIAGNOSIS — M2021 Hallux rigidus, right foot: Secondary | ICD-10-CM | POA: Diagnosis not present

## 2014-06-17 DIAGNOSIS — M25571 Pain in right ankle and joints of right foot: Secondary | ICD-10-CM | POA: Diagnosis not present

## 2014-06-27 DIAGNOSIS — G894 Chronic pain syndrome: Secondary | ICD-10-CM | POA: Diagnosis not present

## 2014-06-27 DIAGNOSIS — Z79899 Other long term (current) drug therapy: Secondary | ICD-10-CM | POA: Diagnosis not present

## 2014-06-27 DIAGNOSIS — M5106 Intervertebral disc disorders with myelopathy, lumbar region: Secondary | ICD-10-CM | POA: Diagnosis not present

## 2014-06-29 DIAGNOSIS — M25571 Pain in right ankle and joints of right foot: Secondary | ICD-10-CM | POA: Diagnosis not present

## 2014-07-01 ENCOUNTER — Telehealth: Payer: Self-pay | Admitting: Family

## 2014-07-01 ENCOUNTER — Ambulatory Visit: Payer: Medicare Other | Admitting: Cardiology

## 2014-07-01 NOTE — Telephone Encounter (Signed)
Patient advised that she needs to follow up with the Dr who performed the surgery.

## 2014-07-09 ENCOUNTER — Emergency Department (HOSPITAL_COMMUNITY)
Admission: EM | Admit: 2014-07-09 | Discharge: 2014-07-10 | Disposition: A | Discharge status: Home / Self Care | Payer: Medicare Other | Attending: Emergency Medicine | Admitting: Emergency Medicine

## 2014-07-09 ENCOUNTER — Emergency Department (HOSPITAL_COMMUNITY): Payer: Medicare Other

## 2014-07-09 ENCOUNTER — Encounter (HOSPITAL_COMMUNITY): Payer: Self-pay | Admitting: Emergency Medicine

## 2014-07-09 DIAGNOSIS — J40 Bronchitis, not specified as acute or chronic: Secondary | ICD-10-CM

## 2014-07-09 DIAGNOSIS — G4700 Insomnia, unspecified: Secondary | ICD-10-CM | POA: Insufficient documentation

## 2014-07-09 DIAGNOSIS — I10 Essential (primary) hypertension: Secondary | ICD-10-CM | POA: Insufficient documentation

## 2014-07-09 DIAGNOSIS — L03115 Cellulitis of right lower limb: Secondary | ICD-10-CM

## 2014-07-09 DIAGNOSIS — K219 Gastro-esophageal reflux disease without esophagitis: Secondary | ICD-10-CM | POA: Insufficient documentation

## 2014-07-09 DIAGNOSIS — J209 Acute bronchitis, unspecified: Secondary | ICD-10-CM | POA: Insufficient documentation

## 2014-07-09 DIAGNOSIS — M7989 Other specified soft tissue disorders: Secondary | ICD-10-CM | POA: Diagnosis not present

## 2014-07-09 DIAGNOSIS — Z79899 Other long term (current) drug therapy: Secondary | ICD-10-CM | POA: Diagnosis not present

## 2014-07-09 DIAGNOSIS — Z87891 Personal history of nicotine dependence: Secondary | ICD-10-CM | POA: Insufficient documentation

## 2014-07-09 DIAGNOSIS — M79661 Pain in right lower leg: Secondary | ICD-10-CM | POA: Diagnosis not present

## 2014-07-09 DIAGNOSIS — E780 Pure hypercholesterolemia: Secondary | ICD-10-CM | POA: Diagnosis not present

## 2014-07-09 DIAGNOSIS — Z96698 Presence of other orthopedic joint implants: Secondary | ICD-10-CM | POA: Diagnosis not present

## 2014-07-09 DIAGNOSIS — Z880 Allergy status to penicillin: Secondary | ICD-10-CM | POA: Insufficient documentation

## 2014-07-09 DIAGNOSIS — L539 Erythematous condition, unspecified: Secondary | ICD-10-CM | POA: Diagnosis not present

## 2014-07-09 DIAGNOSIS — R0602 Shortness of breath: Secondary | ICD-10-CM | POA: Diagnosis not present

## 2014-07-09 DIAGNOSIS — R05 Cough: Secondary | ICD-10-CM | POA: Diagnosis not present

## 2014-07-09 DIAGNOSIS — M25571 Pain in right ankle and joints of right foot: Secondary | ICD-10-CM | POA: Diagnosis present

## 2014-07-09 NOTE — ED Notes (Signed)
Patient reports had surgery on right foot 4 weeks ago. Reports increasing pain, redness, and swelling. Patient also reports she is concerned because she is having cramping and pains up her lower leg to her knee. States she does not want more pain medication, but she is concerned about worsening infection.

## 2014-07-09 NOTE — ED Provider Notes (Signed)
CSN: [id number]     Arrival date & time 07/09/14  2143 History  This chart was scribed for Rolland Porter, MD by Jeanell Sparrow, ED Scribe. This patient was seen in room APA08/APA08 and the patient's care was started at 11:16 PM.   Chief Complaint  Patient presents with  . Foot Pain  . Leg Swelling   The history is provided by the patient. No language interpreter was used.   HPI Comments: Kaitlyn Serrano is a 74 y.o. female who presents to the Emergency Department complaining of constant moderate right foot pain that started after she had right foot surgery bone implant on her MTP of her right great toe about 4 weeks ago. She reports concern for infection because she has been having worsening pain with redness and swelling since yesterday. She reports that the surgical site opened today. She is now also having pain in her distal RLE. She states that she has been having chills without checking her temp at home. She also c/o mild cough with green sputum production that started today with some SOB.She denies chest pain. She states that she has been taking pain medication without any relief of her pain. She was told to expect pain the first 2 weeks then it should improve. She denies any hx of DM or smoking. She denies any fever or drainage. She reports she did follow the post-op instructions about rest the first 2 weeks, but has been walking with her post-op boot sooner than they instructed.   Surgeon- Sullivan Lone PCP NP Lenna Gilford with [facility]   Past Medical History  Diagnosis Date  . Sinus infection   . Hypertension   . High cholesterol   . Insomnia   . Blood transfusion   . GERD (gastroesophageal reflux disease)   . Hemorrhoids   . Glaucoma   . Diverticulosis    Past Surgical History  Procedure Laterality Date  . Cervical spine surgery    . Back surgery     Family History  Problem Relation Age of Onset  . Diabetes Mother   . Heart disease Mother   . Hypertension Mother    . Heart disease Father    History  Substance Use Topics  . Smoking status: Former Smoker -- 4.00 packs/day for 3 years    Types: Cigarettes    Quit date: 02/20/1964  . Smokeless tobacco: Former Systems developer  . Alcohol Use: No   Lives alone   OB History    Gravida Para Term Preterm AB TAB SAB Ectopic Multiple Living   4 3 3  1   1  3      Review of Systems  Constitutional: Positive for chills. Negative for fever.  Musculoskeletal: Positive for myalgias.  Skin: Positive for color change.  All other systems reviewed and are negative.  Allergies  Bee venom; Elavil; and Penicillins  Home Medications   Prior to Admission medications   Medication Sig Start Date End Date Taking? Authorizing Provider  ALPRAZolam Duanne Moron) 0.5 MG tablet TAKE 1/2 TABLET TWICE DAILY AS NEEDED FOR SLEEP OR ANXIETY 06/03/14   Sharion Balloon, FNP  amLODipine (NORVASC) 10 MG tablet Take 1 tablet (10 mg total) by mouth daily. 06/02/14   Arnoldo Lenis, MD  atenolol (TENORMIN) 50 MG tablet Take 1 tablet (50 mg total) by mouth daily. 10/08/13   Sharion Balloon, FNP  cyclobenzaprine (FLEXERIL) 10 MG tablet Take 10 mg by mouth 2 (two) times daily as needed for muscle spasms.  Historical Provider, MD  EPINEPHrine 0.3 mg/0.3 mL IJ SOAJ injection Inject 0.3 mLs (0.3 mg total) into the muscle once. 10/06/13   Lysbeth Penner, FNP  Estradiol Acetate (FEMRING) 0.05 MG/24HR RING Place 0.05 each vaginally once. 10/08/13   Sharion Balloon, FNP  furosemide (LASIX) 40 MG tablet Take 1 tablet (40 mg total) by mouth 2 (two) times daily. Patient taking differently: Take 40 mg by mouth daily.  10/08/13   Sharion Balloon, FNP  HYDROcodone-acetaminophen (NORCO) 10-325 MG per tablet Take 1 tablet by mouth every 6 (six) hours as needed. pain    Historical Provider, MD  HYDROcodone-homatropine (HYCODAN) 5-1.5 MG/5ML syrup Take 5 mLs by mouth every 8 (eight) hours as needed for cough. 05/11/14   Sharion Balloon, FNP  hydrOXYzine  (ATARAX/VISTARIL) 25 MG tablet Take 1 tablet (25 mg total) by mouth 3 (three) times daily as needed. 10/06/13   Lysbeth Penner, FNP  lactulose (CHRONULAC) 10 GM/15ML solution Take 30 mLs (20 g total) by mouth 2 (two) times daily as needed (constipation). 03/02/14   Lysbeth Penner, FNP  pantoprazole (PROTONIX) 40 MG tablet Take 1 tablet (40 mg total) by mouth 2 (two) times daily. 10/08/13   Sharion Balloon, FNP  polyethylene glycol powder (GLYCOLAX/MIRALAX) powder Take 17 g by mouth 2 (two) times daily as needed. 12/08/13   Sharion Balloon, FNP  promethazine (PHENERGAN) 12.5 MG tablet Take 1 tablet (12.5 mg total) by mouth every 8 (eight) hours as needed for nausea. 12/08/13   Sharion Balloon, FNP  simvastatin (ZOCOR) 20 MG tablet Take 1 tablet (20 mg total) by mouth at bedtime. 10/08/13   Sharion Balloon, FNP  traZODone (DESYREL) 50 MG tablet Take 1 tablet (50 mg total) by mouth at bedtime. 10/08/13   Sharion Balloon, FNP  Vitamin D, Ergocalciferol, (DRISDOL) 50000 UNITS CAPS capsule Take 1 capsule (50,000 Units total) by mouth every 7 (seven) days. 06/25/13   Lysbeth Penner, FNP   BP 128/78 mmHg  Pulse 74  Temp(Src) 97.8 F (36.6 C) (Oral)  Resp 16  Ht 5\' 3"  (1.6 m)  Wt 159 lb (72.122 kg)  BMI 28.17 kg/m2  SpO2 98%  Vital signs normal   Physical Exam  Constitutional: She is oriented to person, place, and time. She appears well-developed and well-nourished.  Non-toxic appearance. She does not appear ill. No distress.  HENT:  Head: Normocephalic and atraumatic.  Right Ear: External ear normal.  Left Ear: External ear normal.  Nose: Nose normal. No mucosal edema or rhinorrhea.  Mouth/Throat: Oropharynx is clear and moist and mucous membranes are normal. No dental abscesses or uvula swelling.  Eyes: Conjunctivae and EOM are normal. Pupils are equal, round, and reactive to light.  Neck: Normal range of motion and full passive range of motion without pain. Neck supple.  Pulmonary/Chest:  Effort normal and breath sounds normal. No respiratory distress. She has no wheezes. She has no rhonchi. She has no rales. She exhibits no tenderness and no crepitus.  No coughing heard during interview  Abdominal: Soft. Normal appearance and bowel sounds are normal. She exhibits no distension. There is no tenderness. There is no rebound and no guarding.  Musculoskeletal: Normal range of motion. She exhibits no edema or tenderness.  Moves all extremities well.  Tender in the mid calf on the right.   Neurological: She is alert and oriented to person, place, and time. She has normal strength. No cranial nerve deficit.  Skin: Skin  is warm, dry and intact. No rash noted. No erythema. No pallor.  Tender to the distal anterior part of lower right leg without significant redness or swelling. Surgical site at the dorsum over the MTP joint has  swelling and redness.  Psychiatric: She has a normal mood and affect. Her speech is normal and behavior is normal. Her mood appears not anxious.  Nursing note and vitals reviewed.     ED Course  Procedures (including critical care time)  Medications  vancomycin (VANCOCIN) IVPB 1000 mg/200 mL premix (1,000 mg Intravenous New Bag/Given 07/10/14 0205)  enoxaparin (LOVENOX) injection 70 mg (70 mg Subcutaneous Given 07/10/14 0214)    DIAGNOSTIC STUDIES: Oxygen Saturation is 98% on RA, normal by my interpretation.    COORDINATION OF CARE: 11:16 PM- Pt advised of plan for treatment and pt agrees.   Patient has repeated multiple times she has pain me She was also given Lovenoxdicine to take at home she does not need pain medicine in the ED.  Patient was given vancomycin IV in the ED. She was also given Lovenox 1 mg per KG until she can return later this morning to get her outpatient Doppler ultrasound of her right lower extremity due to the pain in her calf.    Review of the [place] shows patient gets #90 hydrocodone 10/325 monthly, the last  was March 15 by Karenann Cai, PA with [facility] in [place]. She was prescribed 30 oxycodone 7.5/325 on April 19 by her foot surgeon.  Patient's tests were reviewed. Her sedimentation rate is well within normal limits which makes osteomyelitis less likely. Hopefully she just has a localized cellulitis. Patient will return later this morning to get a Doppler ultrasound to look for DVT. Although she does have some shortness of breath she also has a cough with  Green sputum production. I suspect she has bronchitis not PE. Plus the pain in her leg is all below her knee making PE less likely.    Labs Review Results for orders placed or performed during the hospital encounter of 07/09/14  CBC with Differential/Platelet  Result Value Ref Range   WBC 11.6 (H) 4.0 - 10.5 K/uL   RBC 4.55 3.87 - 5.11 MIL/uL   Hemoglobin 13.9 12.0 - 15.0 g/dL   HCT 41.5 36.0 - 46.0 %   MCV 91.2 78.0 - 100.0 fL   MCH 30.5 26.0 - 34.0 pg   MCHC 33.5 30.0 - 36.0 g/dL   RDW 12.9 11.5 - 15.5 %   Platelets 242 150 - 400 K/uL   Neutrophils Relative % 66 43 - 77 %   Neutro Abs 7.7 1.7 - 7.7 K/uL   Lymphocytes Relative 17 12 - 46 %   Lymphs Abs 2.0 0.7 - 4.0 K/uL   Monocytes Relative 12 3 - 12 %   Monocytes Absolute 1.4 (H) 0.1 - 1.0 K/uL   Eosinophils Relative 4 0 - 5 %   Eosinophils Absolute 0.5 0.0 - 0.7 K/uL   Basophils Relative 1 0 - 1 %   Basophils Absolute 0.1 0.0 - 0.1 K/uL  Basic metabolic panel  Result Value Ref Range   Sodium 139 135 - 145 mmol/L   Potassium 3.5 3.5 - 5.1 mmol/L   Chloride 102 101 - 111 mmol/L   CO2 28 22 - 32 mmol/L   Glucose, Bld 114 (H) 65 - 99 mg/dL   BUN 19 6 - 20 mg/dL   Creatinine, Ser 0.77 0.44 - 1.00  mg/dL   Calcium 9.4 8.9 - 10.3 mg/dL   GFR calc non Af Amer >60 >60 mL/min   GFR calc Af Amer >60 >60 mL/min   Anion gap 9 5 - 15  Sedimentation rate  Result Value Ref Range   Sed Rate 5 0 - 22 mm/hr  APTT  Result Value Ref Range   aPTT 26 24 - 37  seconds  Protime-INR  Result Value Ref Range   Prothrombin Time 13.3 11.6 - 15.2 seconds   INR 1.00 0.00 - 1.49   Laboratory interpretation all normal except leukocytosis with normal sedimentation rate     Imaging Review Dg Chest 2 View  07/10/2014   CLINICAL DATA:  Productive cough, shortness of breath, mild chest pain for 2 days.  EXAM: CHEST  2 VIEW  COMPARISON:  04/17/2014  FINDINGS: Postoperative changes in the cervical spine and thoracolumbar spine. Normal heart size and pulmonary vascularity. No focal airspace disease or consolidation in the lungs. No blunting of costophrenic angles. No pneumothorax. Mediastinal contours appear intact. Calcified and tortuous aorta.  IMPRESSION: No active cardiopulmonary disease.   Electronically Signed   By: Lucienne Capers M.D.   On: 07/10/2014 00:58   Dg Foot Complete Right  07/10/2014   CLINICAL DATA:  Acute onset of right foot pain and erythema at the base of the great toe. Initial encounter.  EXAM: RIGHT FOOT COMPLETE - 3+ VIEW  COMPARISON:  Right foot radiographs performed 12/29/2013  FINDINGS: There is a prosthesis at the first metatarsophalangeal joint. Minimal overlying soft tissue calcification may be postoperative in nature, or could reflect mild osseous erosion at the distal aspect of the remaining first metatarsal. Would correlate for evidence of infection.  Visualized joint spaces are otherwise preserved. There is no evidence of talar subluxation; the subtalar joint is unremarkable in appearance.  Mild dorsal soft tissue swelling is noted.  IMPRESSION: Prosthesis at the first metatarsophalangeal joint. Minimal overlying soft tissue calcification may be postoperative in nature, or could reflect mild osseous erosion at the distal aspect of the remaining first metatarsal. Would correlate for evidence of infection.   Electronically Signed   By: Garald Balding M.D.   On: 07/10/2014 00:59     EKG Interpretation None      MDM   Final  diagnoses:  Cellulitis of foot without toes, right  Calf pain, right  Bronchitis   New Prescriptions   SULFAMETHOXAZOLE-TRIMETHOPRIM (BACTRIM DS,SEPTRA DS) 800-160 MG PER TABLET    Take 1 tablet by mouth 2 (two) times daily.    Plan discharge  Rolland Porter, MD, FACEP   I personally performed the services described in this documentation, which was scribed in my presence. The recorded information has been reviewed and considered.  Rolland Porter, MD, Barbette Or, MD 07/10/14 [id number]

## 2014-07-10 ENCOUNTER — Ambulatory Visit (HOSPITAL_COMMUNITY)
Admit: 2014-07-10 | Discharge: 2014-07-10 | Disposition: A | Discharge status: Home / Self Care | Payer: Medicare Other | Attending: Emergency Medicine | Admitting: Emergency Medicine

## 2014-07-10 DIAGNOSIS — M79604 Pain in right leg: Secondary | ICD-10-CM | POA: Diagnosis not present

## 2014-07-10 DIAGNOSIS — Z96698 Presence of other orthopedic joint implants: Secondary | ICD-10-CM | POA: Diagnosis not present

## 2014-07-10 DIAGNOSIS — R600 Localized edema: Secondary | ICD-10-CM | POA: Diagnosis not present

## 2014-07-10 DIAGNOSIS — M7989 Other specified soft tissue disorders: Secondary | ICD-10-CM | POA: Diagnosis not present

## 2014-07-10 DIAGNOSIS — R05 Cough: Secondary | ICD-10-CM | POA: Diagnosis not present

## 2014-07-10 DIAGNOSIS — R0602 Shortness of breath: Secondary | ICD-10-CM | POA: Diagnosis not present

## 2014-07-10 DIAGNOSIS — L539 Erythematous condition, unspecified: Secondary | ICD-10-CM | POA: Diagnosis not present

## 2014-07-10 DIAGNOSIS — L03115 Cellulitis of right lower limb: Secondary | ICD-10-CM | POA: Diagnosis not present

## 2014-07-10 LAB — BASIC METABOLIC PANEL
Anion gap: 9 (ref 5–15)
BUN: 19 mg/dL (ref 6–20)
CALCIUM: 9.4 mg/dL (ref 8.9–10.3)
CO2: 28 mmol/L (ref 22–32)
CREATININE: 0.77 mg/dL (ref 0.44–1.00)
Chloride: 102 mmol/L (ref 101–111)
GFR calc non Af Amer: >60 mL/min (ref >60)
Glucose, Bld: 114 mg/dL — ABNORMAL HIGH (ref 65–99)
POTASSIUM: 3.5 mmol/L (ref 3.5–5.1)
Sodium: 139 mmol/L (ref 135–145)

## 2014-07-10 LAB — CBC WITH DIFFERENTIAL/PLATELET
BASOS ABS: 0.1 10*3/uL (ref 0.0–0.1)
Basophils Relative: 1 % (ref 0–1)
EOS ABS: 0.5 10*3/uL (ref 0.0–0.7)
Eosinophils Relative: 4 % (ref 0–5)
HEMATOCRIT: 41.5 % (ref 36.0–46.0)
Hemoglobin: 13.9 g/dL (ref 12.0–15.0)
LYMPHS PCT: 17 % (ref 12–46)
Lymphs Abs: 2 10*3/uL (ref 0.7–4.0)
MCH: 30.5 pg (ref 26.0–34.0)
MCHC: 33.5 g/dL (ref 30.0–36.0)
MCV: 91.2 fL (ref 78.0–100.0)
Monocytes Absolute: 1.4 10*3/uL — ABNORMAL HIGH (ref 0.1–1.0)
Monocytes Relative: 12 % (ref 3–12)
NEUTROS PCT: 66 % (ref 43–77)
Neutro Abs: 7.7 10*3/uL (ref 1.7–7.7)
Platelets: 242 10*3/uL (ref 150–400)
RBC: 4.55 MIL/uL (ref 3.87–5.11)
RDW: 12.9 % (ref 11.5–15.5)
WBC: 11.6 10*3/uL — ABNORMAL HIGH (ref 4.0–10.5)

## 2014-07-10 LAB — PROTIME-INR
INR: 1 (ref 0.00–1.49)
Prothrombin Time: 13.3 seconds (ref 11.6–15.2)

## 2014-07-10 LAB — APTT: APTT: 26 s (ref 24–37)

## 2014-07-10 LAB — SEDIMENTATION RATE: Sed Rate: 5 mm/hr (ref 0–22)

## 2014-07-10 MED ORDER — VANCOMYCIN HCL IN DEXTROSE 1-5 GM/200ML-% IV SOLN
1000.0000 mg | Freq: Once | INTRAVENOUS | Status: AC
  Administered 2014-07-10: 1000 mg via INTRAVENOUS
  Filled 2014-07-10: qty 200

## 2014-07-10 MED ORDER — ENOXAPARIN SODIUM 80 MG/0.8ML ~~LOC~~ SOLN
70.0000 mg | Freq: Once | SUBCUTANEOUS | Status: AC
  Administered 2014-07-10: 70 mg via SUBCUTANEOUS
  Filled 2014-07-10: qty 0.8

## 2014-07-10 MED ORDER — SULFAMETHOXAZOLE-TRIMETHOPRIM 800-160 MG PO TABS: 1.0000 | ORAL_TABLET | Freq: Two times a day (BID) | ORAL | Status: DC

## 2014-07-10 NOTE — Discharge Instructions (Signed)
Take the antibiotic as prescribed. It should help with your foot and your cough. You need to call Dr Marion Downer answering service and talk to the doctor on call about being seen on Monday, May 16th if you aren't improving. You have an appointment at 10 am later this morning to get a test to check your right leg for a blood clot. Come 15 minutes early and register as an outpatient in the ED. The ED doctor working will let you know the test results.

## 2014-07-26 ENCOUNTER — Emergency Department (HOSPITAL_COMMUNITY): Payer: Medicare Other

## 2014-07-26 ENCOUNTER — Emergency Department (HOSPITAL_COMMUNITY)
Admission: EM | Admit: 2014-07-26 | Discharge: 2014-07-26 | Disposition: A | Discharge status: Other Acute Inpatient Hospital | Payer: Medicare Other | Attending: Emergency Medicine | Admitting: Emergency Medicine

## 2014-07-26 ENCOUNTER — Emergency Department (HOSPITAL_COMMUNITY)
Admission: EM | Admit: 2014-07-26 | Discharge: 2014-07-26 | Disposition: A | Discharge status: Home / Self Care | Payer: Medicare Other | Attending: Emergency Medicine | Admitting: Emergency Medicine

## 2014-07-26 ENCOUNTER — Encounter (HOSPITAL_COMMUNITY): Payer: Self-pay | Admitting: Emergency Medicine

## 2014-07-26 DIAGNOSIS — Z87891 Personal history of nicotine dependence: Secondary | ICD-10-CM | POA: Insufficient documentation

## 2014-07-26 DIAGNOSIS — H409 Unspecified glaucoma: Secondary | ICD-10-CM | POA: Insufficient documentation

## 2014-07-26 DIAGNOSIS — I10 Essential (primary) hypertension: Secondary | ICD-10-CM | POA: Diagnosis not present

## 2014-07-26 DIAGNOSIS — E780 Pure hypercholesterolemia: Secondary | ICD-10-CM | POA: Insufficient documentation

## 2014-07-26 DIAGNOSIS — R109 Unspecified abdominal pain: Secondary | ICD-10-CM

## 2014-07-26 DIAGNOSIS — Z8709 Personal history of other diseases of the respiratory system: Secondary | ICD-10-CM | POA: Insufficient documentation

## 2014-07-26 DIAGNOSIS — N3941 Urge incontinence: Secondary | ICD-10-CM | POA: Diagnosis not present

## 2014-07-26 DIAGNOSIS — K219 Gastro-esophageal reflux disease without esophagitis: Secondary | ICD-10-CM | POA: Insufficient documentation

## 2014-07-26 DIAGNOSIS — Z79899 Other long term (current) drug therapy: Secondary | ICD-10-CM | POA: Insufficient documentation

## 2014-07-26 DIAGNOSIS — K828 Other specified diseases of gallbladder: Secondary | ICD-10-CM | POA: Diagnosis not present

## 2014-07-26 DIAGNOSIS — Z793 Long term (current) use of hormonal contraceptives: Secondary | ICD-10-CM | POA: Diagnosis not present

## 2014-07-26 DIAGNOSIS — R3915 Urgency of urination: Secondary | ICD-10-CM

## 2014-07-26 DIAGNOSIS — G4700 Insomnia, unspecified: Secondary | ICD-10-CM | POA: Insufficient documentation

## 2014-07-26 DIAGNOSIS — B373 Candidiasis of vulva and vagina: Secondary | ICD-10-CM | POA: Diagnosis not present

## 2014-07-26 DIAGNOSIS — Z880 Allergy status to penicillin: Secondary | ICD-10-CM | POA: Diagnosis not present

## 2014-07-26 DIAGNOSIS — B3731 Acute candidiasis of vulva and vagina: Secondary | ICD-10-CM

## 2014-07-26 DIAGNOSIS — K760 Fatty (change of) liver, not elsewhere classified: Secondary | ICD-10-CM | POA: Diagnosis not present

## 2014-07-26 DIAGNOSIS — R1032 Left lower quadrant pain: Secondary | ICD-10-CM | POA: Diagnosis present

## 2014-07-26 LAB — URINALYSIS, ROUTINE W REFLEX MICROSCOPIC
BILIRUBIN URINE: NEGATIVE
Glucose, UA: NEGATIVE mg/dL
Hgb urine dipstick: NEGATIVE
KETONES UR: NEGATIVE mg/dL
LEUKOCYTES UA: NEGATIVE
NITRITE: NEGATIVE
PH: 6.5 (ref 5.0–8.0)
PROTEIN: NEGATIVE mg/dL
Specific Gravity, Urine: 1.015 (ref 1.005–1.030)
UROBILINOGEN UA: 0.2 mg/dL (ref 0.0–1.0)

## 2014-07-26 MED ORDER — FLUCONAZOLE 100 MG PO TABS
150.0000 mg | ORAL_TABLET | Freq: Once | ORAL | Status: AC
  Administered 2014-07-26: 150 mg via ORAL
  Filled 2014-07-26: qty 2

## 2014-07-26 NOTE — ED Notes (Signed)
Pt denies any needs. Family at bedside.

## 2014-07-26 NOTE — ED Notes (Signed)
Pt complaining of right flank pain radiating into lower abdomen

## 2014-07-26 NOTE — Discharge Instructions (Signed)
It was our pleasure to provide your ER care today - we hope that you feel better.  Follow up with primary care doctor in coming week for recheck if symptoms fail to improve/resolve.  Return to ER if worse, new symptoms, fevers, worsening or severe abdominal pain, other concern.

## 2014-07-26 NOTE — ED Notes (Signed)
Pt complaining of right flank pain, radiating in lower abdomen. Frequency urination

## 2014-07-26 NOTE — ED Provider Notes (Signed)
CSN: [id number]     Arrival date & time 07/26/14  1456 History   First MD Initiated Contact with Patient 07/26/14 1459     Chief Complaint  Patient presents with  . Flank Pain     (Consider location/radiation/quality/duration/timing/severity/associated sxs/prior Treatment) Patient is a 74 y.o. female presenting with flank pain. The history is provided by the patient.  Flank Pain Pertinent negatives include no chest pain, no abdominal pain, no headaches and no shortness of breath.   the patient is a 74 year old female with history of uncomplicated UTI, remote history kidney stones, who presents with suprapubic and left flank pain in the past day. Symptoms are persistent, constant, nonradiating, without specific exacerbating or alleviating factors. There is no associated nausea or vomiting. There is no abdominal distention. Patient had normal bowel movement today. Patient does note urinary urgency. Question mild dysuria. There is no vaginal discharge or bleeding. Patient denies fever or chills. Patient denies hematuria. Patient denies feeling sick for ill. Patient describes the pain as being moderate.  Pt notes was on abx a week ago.   Past Medical History  Diagnosis Date  . Sinus infection   . Hypertension   . High cholesterol   . Insomnia   . Blood transfusion   . GERD (gastroesophageal reflux disease)   . Hemorrhoids   . Glaucoma   . Diverticulosis    Past Surgical History  Procedure Laterality Date  . Cervical spine surgery    . Back surgery     Family History  Problem Relation Age of Onset  . Diabetes Mother   . Heart disease Mother   . Hypertension Mother   . Heart disease Father    History  Substance Use Topics  . Smoking status: Former Smoker -- 4.00 packs/day for 3 years    Types: Cigarettes    Quit date: 02/20/1964  . Smokeless tobacco: Former Systems developer  . Alcohol Use: No   OB History    Gravida Para Term Preterm AB TAB SAB Ectopic Multiple Living   4 3 3  1   1  3       Review of Systems  Constitutional: Negative for fever and chills.  HENT: Negative for sore throat.   Eyes: Negative for redness.  Respiratory: Negative for shortness of breath.   Cardiovascular: Negative for chest pain.  Gastrointestinal: Negative for vomiting and abdominal pain.  Endocrine: Negative for polyuria.  Genitourinary: Positive for urgency and flank pain. Negative for vaginal bleeding and vaginal discharge.  Musculoskeletal: Negative for back pain and neck pain.  Skin: Negative for rash.  Neurological: Negative for headaches.  Hematological: Does not bruise/bleed easily.  Psychiatric/Behavioral: Negative for confusion.      Allergies  Bee venom; Elavil; and Penicillins  Home Medications   Prior to Admission medications   Medication Sig Start Date End Date Taking? Authorizing Provider  ALPRAZolam Duanne Moron) 0.5 MG tablet TAKE 1/2 TABLET TWICE DAILY AS NEEDED FOR SLEEP OR ANXIETY 06/03/14   Sharion Balloon, FNP  amLODipine (NORVASC) 10 MG tablet Take 1 tablet (10 mg total) by mouth daily. 06/02/14   Arnoldo Lenis, MD  atenolol (TENORMIN) 50 MG tablet Take 1 tablet (50 mg total) by mouth daily. 10/08/13   Sharion Balloon, FNP  cyclobenzaprine (FLEXERIL) 10 MG tablet Take 10 mg by mouth 2 (two) times daily as needed for muscle spasms.    Historical Provider, MD  EPINEPHrine 0.3 mg/0.3 mL IJ SOAJ injection Inject 0.3 mLs (0.3 mg total) into the  muscle once. 10/06/13   Lysbeth Penner, FNP  Estradiol Acetate (FEMRING) 0.05 MG/24HR RING Place 0.05 each vaginally once. 10/08/13   Sharion Balloon, FNP  furosemide (LASIX) 40 MG tablet Take 1 tablet (40 mg total) by mouth 2 (two) times daily. Patient taking differently: Take 40 mg by mouth daily.  10/08/13   Sharion Balloon, FNP  HYDROcodone-acetaminophen (NORCO) 10-325 MG per tablet Take 1 tablet by mouth every 6 (six) hours as needed. pain    Historical Provider, MD  HYDROcodone-homatropine (HYCODAN) 5-1.5 MG/5ML syrup Take 5 mLs  by mouth every 8 (eight) hours as needed for cough. 05/11/14   Sharion Balloon, FNP  hydrOXYzine (ATARAX/VISTARIL) 25 MG tablet Take 1 tablet (25 mg total) by mouth 3 (three) times daily as needed. 10/06/13   Lysbeth Penner, FNP  lactulose (CHRONULAC) 10 GM/15ML solution Take 30 mLs (20 g total) by mouth 2 (two) times daily as needed (constipation). 03/02/14   Lysbeth Penner, FNP  pantoprazole (PROTONIX) 40 MG tablet Take 1 tablet (40 mg total) by mouth 2 (two) times daily. 10/08/13   Sharion Balloon, FNP  polyethylene glycol powder (GLYCOLAX/MIRALAX) powder Take 17 g by mouth 2 (two) times daily as needed. 12/08/13   Sharion Balloon, FNP  promethazine (PHENERGAN) 12.5 MG tablet Take 1 tablet (12.5 mg total) by mouth every 8 (eight) hours as needed for nausea. 12/08/13   Sharion Balloon, FNP  simvastatin (ZOCOR) 20 MG tablet Take 1 tablet (20 mg total) by mouth at bedtime. 10/08/13   Sharion Balloon, FNP  sulfamethoxazole-trimethoprim (BACTRIM DS,SEPTRA DS) 800-160 MG per tablet Take 1 tablet by mouth 2 (two) times daily. 07/10/14   Rolland Porter, MD  traZODone (DESYREL) 50 MG tablet Take 1 tablet (50 mg total) by mouth at bedtime. 10/08/13   Sharion Balloon, FNP  Vitamin D, Ergocalciferol, (DRISDOL) 50000 UNITS CAPS capsule Take 1 capsule (50,000 Units total) by mouth every 7 (seven) days. 06/25/13   Lysbeth Penner, FNP   There were no vitals taken for this visit. Physical Exam  Constitutional: She appears well-developed and well-nourished. No distress.  HENT:  Mouth/Throat: Oropharynx is clear and moist.  Eyes: Conjunctivae are normal. No scleral icterus.  Neck: Neck supple. No tracheal deviation present.  Cardiovascular: Normal rate.   Pulmonary/Chest: Effort normal and breath sounds normal. No respiratory distress.  Abdominal: Soft. Normal appearance and bowel sounds are normal. She exhibits no distension and no mass. There is no tenderness. There is no rebound and no guarding.  Genitourinary:  No  cva tenderness. Mild whitish vaginal discharge, c/w yeast.   Musculoskeletal: She exhibits no edema.  Neurological: She is alert.  No shingles/rash in area of pain.   Skin: Skin is warm and dry. No rash noted. She is not diaphoretic.  Psychiatric: She has a normal mood and affect.  Nursing note and vitals reviewed.   ED Course  Procedures (including critical care time) Labs Review  Results for orders placed or performed during the hospital encounter of 07/26/14  Urinalysis, Routine w reflex microscopic (not at [facility])  Result Value Ref Range   Color, Urine YELLOW YELLOW   APPearance CLEAR CLEAR   Specific Gravity, Urine 1.015 1.005 - 1.030   pH 6.5 5.0 - 8.0   Glucose, UA NEGATIVE NEGATIVE mg/dL   Hgb urine dipstick NEGATIVE NEGATIVE   Bilirubin Urine NEGATIVE NEGATIVE   Ketones, ur NEGATIVE NEGATIVE mg/dL   Protein, ur NEGATIVE NEGATIVE mg/dL  Urobilinogen, UA 0.2 0.0 - 1.0 mg/dL   Nitrite NEGATIVE NEGATIVE   Leukocytes, UA NEGATIVE NEGATIVE     Ct Renal Stone Study  07/26/2014   CLINICAL DATA:  Left-sided flank pain  EXAM: CT ABDOMEN AND PELVIS WITHOUT CONTRAST  TECHNIQUE: Multidetector CT imaging of the abdomen and pelvis was performed following the standard protocol without IV contrast.  COMPARISON:  04/10/2011  FINDINGS: Lung bases are free of acute infiltrate or sizable effusion.  Diffuse fatty infiltration of the liver is seen. The gallbladder is well distended without focal abnormality. The spleen, adrenal glands and pancreas are within normal limits. The kidneys are well visualized bilaterally and reveal no renal calculi or obstructive changes. Diffuse aortoiliac calcifications are noted without aneurysmal dilatation. Diffuse diverticular change of the colon is noted. The appendix is not well visualized although no inflammatory changes to suggest appendicitis are seen. Changes of prior hysterectomy are noted. A cervical pessary is seen postsurgical changes are noted in the  thoracolumbar spine. No acute bony abnormality is seen.  IMPRESSION: Chronic changes without acute abnormality.   Electronically Signed   By: Inez Catalina M.D.   On: 07/26/2014 16:56       MDM   Iv ns. Labs. Ct. (pt notes remote hx kidney stones, ?similar pain, and urinary urgency).   Reviewed nursing notes and prior charts for additional history.   Given recent abx tx, urinary symptoms w neg ua, white vag d/c c/w yeast, will rx fluconazole.  abd soft nt.  Ct neg acute.   Pt tolerating po, afeb, benign abd exam, and currently appears stable for d/c.      Lajean Saver, MD 07/26/14 1800

## 2014-07-27 ENCOUNTER — Encounter (HOSPITAL_COMMUNITY): Payer: Self-pay | Admitting: Emergency Medicine

## 2014-08-04 ENCOUNTER — Ambulatory Visit (INDEPENDENT_AMBULATORY_CARE_PROVIDER_SITE_OTHER): Payer: Medicare Other | Admitting: Family

## 2014-08-04 ENCOUNTER — Encounter: Payer: Self-pay | Admitting: Family

## 2014-08-04 VITALS — BP 119/73 | HR 63 | Temp 97.5°F | Ht 63.0 in | Wt 169.0 lb

## 2014-08-04 DIAGNOSIS — J069 Acute upper respiratory infection, unspecified: Secondary | ICD-10-CM

## 2014-08-04 DIAGNOSIS — R059 Cough, unspecified: Secondary | ICD-10-CM

## 2014-08-04 DIAGNOSIS — R05 Cough: Secondary | ICD-10-CM

## 2014-08-04 MED ORDER — BENZONATATE 200 MG PO CAPS: 200.0000 mg | ORAL_CAPSULE | Freq: Three times a day (TID) | ORAL | Status: DC | PRN

## 2014-08-04 MED ORDER — METHYLPREDNISOLONE 4 MG PO TBPK: ORAL_TABLET | ORAL | Status: DC

## 2014-08-04 MED ORDER — HYDROCODONE-HOMATROPINE 5-1.5 MG/5ML PO SYRP: 5.0000 mL | ORAL_SOLUTION | Freq: Three times a day (TID) | ORAL | Status: DC | PRN

## 2014-08-04 NOTE — Patient Instructions (Signed)
Upper Respiratory Infection, Adult An upper respiratory infection (URI) is also sometimes known as the common cold. The upper respiratory tract includes the nose, sinuses, throat, trachea, and bronchi. Bronchi are the airways leading to the lungs. Most people improve within 1 week, but symptoms can last up to 2 weeks. A residual cough may last even longer.  CAUSES Many different viruses can infect the tissues lining the upper respiratory tract. The tissues become irritated and inflamed and often become very moist. Mucus production is also common. A cold is contagious. You can easily spread the virus to others by oral contact. This includes kissing, sharing a glass, coughing, or sneezing. Touching your mouth or nose and then touching a surface, which is then touched by another person, can also spread the virus. SYMPTOMS  Symptoms typically develop 1 to 3 days after you come in contact with a cold virus. Symptoms vary from person to person. They may include:  Runny nose.  Sneezing.  Nasal congestion.  Sinus irritation.  Sore throat.  Loss of voice (laryngitis).  Cough.  Fatigue.  Muscle aches.  Loss of appetite.  Headache.  Low-grade fever. DIAGNOSIS  You might diagnose your own cold based on familiar symptoms, since most people get a cold 2 to 3 times a year. Your caregiver can confirm this based on your exam. Most importantly, your caregiver can check that your symptoms are not due to another disease such as strep throat, sinusitis, pneumonia, asthma, or epiglottitis. Blood tests, throat tests, and X-rays are not necessary to diagnose a common cold, but they may sometimes be helpful in excluding other more serious diseases. Your caregiver will decide if any further tests are required. RISKS AND COMPLICATIONS  You may be at risk for a more severe case of the common cold if you smoke cigarettes, have chronic heart disease (such as heart failure) or lung disease (such as asthma), or if  you have a weakened immune system. The very young and very old are also at risk for more serious infections. Bacterial sinusitis, middle ear infections, and bacterial pneumonia can complicate the common cold. The common cold can worsen asthma and chronic obstructive pulmonary disease (COPD). Sometimes, these complications can require emergency medical care and may be life-threatening. PREVENTION  The best way to protect against getting a cold is to practice good hygiene. Avoid oral or hand contact with people with cold symptoms. Wash your hands often if contact occurs. There is no clear evidence that vitamin C, vitamin E, echinacea, or exercise reduces the chance of developing a cold. However, it is always recommended to get plenty of rest and practice good nutrition. TREATMENT  Treatment is directed at relieving symptoms. There is no cure. Antibiotics are not effective, because the infection is caused by a virus, not by bacteria. Treatment may include:  Increased fluid intake. Sports drinks offer valuable electrolytes, sugars, and fluids.  Breathing heated mist or steam (vaporizer or shower).  Eating chicken soup or other clear broths, and maintaining good nutrition.  Getting plenty of rest.  Using gargles or lozenges for comfort.  Controlling fevers with ibuprofen or acetaminophen as directed by your caregiver.  Increasing usage of your inhaler if you have asthma. Zinc gel and zinc lozenges, taken in the first 24 hours of the common cold, can shorten the duration and lessen the severity of symptoms. Pain medicines may help with fever, muscle aches, and throat pain. A variety of non-prescription medicines are available to treat congestion and runny nose. Your caregiver   can make recommendations and may suggest nasal or lung inhalers for other symptoms.  HOME CARE INSTRUCTIONS   Only take over-the-counter or prescription medicines for pain, discomfort, or fever as directed by your  caregiver.  Use a warm mist humidifier or inhale steam from a shower to increase air moisture. This may keep secretions moist and make it easier to breathe.  Drink enough water and fluids to keep your urine clear or pale yellow.  Rest as needed.  Return to work when your temperature has returned to normal or as your caregiver advises. You may need to stay home longer to avoid infecting others. You can also use a face mask and careful hand washing to prevent spread of the virus. SEEK MEDICAL CARE IF:   After the first few days, you feel you are getting worse rather than better.  You need your caregiver's advice about medicines to control symptoms.  You develop chills, worsening shortness of breath, or brown or red sputum. These may be signs of pneumonia.  You develop yellow or brown nasal discharge or pain in the face, especially when you bend forward. These may be signs of sinusitis.  You develop a fever, swollen neck glands, pain with swallowing, or white areas in the back of your throat. These may be signs of strep throat. SEEK IMMEDIATE MEDICAL CARE IF:   You have a fever.  You develop severe or persistent headache, ear pain, sinus pain, or chest pain.  You develop wheezing, a prolonged cough, cough up blood, or have a change in your usual mucus (if you have chronic lung disease).  You develop sore muscles or a stiff neck. Document Released: 08/07/2000 Document Revised: 05/06/2011 Document Reviewed: 05/19/2013 [employer] Patient Information 2015 [employer]. This information is not intended to replace advice given to you by your health care provider. Make sure you discuss any questions you have with your health care provider.  - Take meds as prescribed - Use a cool mist humidifier  -Use saline nose sprays frequently -Saline irrigations of the nose can be very helpful if done frequently.  * 4X daily for 1 week*  * Use of a nettie pot can be helpful with this. Follow  directions with this* -Force fluids -For any cough or congestion  Use plain Mucinex- regular strength or max strength is fine   * Children- consult with Pharmacist for dosing -For fever or aces or pains- take tylenol or ibuprofen appropriate for age and weight.  * for fevers greater than 101 orally you may alternate ibuprofen and tylenol every  3 hours. -Throat lozenges if help   Oniyah Rohe, FNP   

## 2014-08-04 NOTE — Progress Notes (Signed)
Subjective:    Patient ID: Kaitlyn Serrano, female    DOB: 1940/06/03, 74 y.o.   MRN: [id number]  Cough This is a new problem. The current episode started in the past 7 days ("three days ago"). The problem has been unchanged. The problem occurs constantly. The cough is productive of sputum. Associated symptoms include chills, myalgias, nasal congestion, postnasal drip, rhinorrhea, a sore throat, shortness of breath and wheezing. Pertinent negatives include no ear congestion, ear pain, fever, headaches or hemoptysis. The symptoms are aggravated by pollens. She has tried rest and prescription cough suppressant for the symptoms. The treatment provided mild relief. There is no history of asthma or COPD.      Review of Systems  Constitutional: Positive for chills. Negative for fever.  HENT: Positive for postnasal drip, rhinorrhea and sore throat. Negative for ear pain.   Eyes: Negative.   Respiratory: Positive for cough, shortness of breath and wheezing. Negative for hemoptysis.   Cardiovascular: Negative.  Negative for palpitations.  Gastrointestinal: Negative.   Endocrine: Negative.   Genitourinary: Negative.   Musculoskeletal: Positive for myalgias.  Neurological: Negative.  Negative for headaches.  Hematological: Negative.   Psychiatric/Behavioral: Negative.   All other systems reviewed and are negative.      Objective:   Physical Exam  Constitutional: She is oriented to person, place, and time. She appears well-developed and well-nourished. No distress.  HENT:  Head: Normocephalic and atraumatic.  Right Ear: External ear normal.  Mouth/Throat: Oropharynx is clear and moist.  Eyes: Pupils are equal, round, and reactive to light.  Neck: Normal range of motion. Neck supple. No thyromegaly present.  Cardiovascular: Normal rate, regular rhythm, normal heart sounds and intact distal pulses.   No murmur heard. Pulmonary/Chest: Effort normal and breath sounds normal. No respiratory  distress. She has no wheezes.  Abdominal: Soft. Bowel sounds are normal. She exhibits no distension. There is no tenderness.  Musculoskeletal: Normal range of motion. She exhibits no edema or tenderness.  Neurological: She is alert and oriented to person, place, and time. She has normal reflexes. No cranial nerve deficit.  Skin: Skin is warm and dry.  Psychiatric: She has a normal mood and affect. Her behavior is normal. Judgment and thought content normal.  Vitals reviewed.     BP 119/73 mmHg  Pulse 63  Temp(Src) 97.5 F (36.4 C) (Oral)  Ht 5\' 3"  (1.6 m)  Wt 169 lb (76.658 kg)  BMI 29.94 kg/m2     Assessment & Plan:  1. Cough - HYDROcodone-homatropine (HYCODAN) 5-1.5 MG/5ML syrup; Take 5 mLs by mouth every 8 (eight) hours as needed for cough.  Dispense: 120 mL; Refill: 0  2. Acute upper respiratory infection -- Take meds as prescribed - Use a cool mist humidifier  -Use saline nose sprays frequently -Saline irrigations of the nose can be very helpful if done frequently.  * 4X daily for 1 week*  * Use of a nettie pot can be helpful with this. Follow directions with this* -Force fluids -For any cough or congestion  Use plain Mucinex- regular strength or max strength is fine   * Children- consult with Pharmacist for dosing -For fever or aces or pains- take tylenol or ibuprofen appropriate for age and weight.  * for fevers greater than 101 orally you may alternate ibuprofen and tylenol every  3 hours. -Throat lozenges if helps - HYDROcodone-homatropine (HYCODAN) 5-1.5 MG/5ML syrup; Take 5 mLs by mouth every 8 (eight) hours as needed for cough.  Dispense: 120  mL; Refill: 0 - benzonatate (TESSALON) 200 MG capsule; Take 1 capsule (200 mg total) by mouth 3 (three) times daily as needed.  Dispense: 30 capsule; Refill: 1 - methylPREDNISolone (MEDROL DOSEPAK) 4 MG TBPK tablet; Use as directed  Dispense: 21 tablet; Refill: 0  Evelina Dun, FNP

## 2014-08-12 ENCOUNTER — Encounter: Payer: Self-pay | Admitting: Physician Assistant

## 2014-08-12 ENCOUNTER — Ambulatory Visit (INDEPENDENT_AMBULATORY_CARE_PROVIDER_SITE_OTHER): Payer: Medicare Other | Admitting: Physician Assistant

## 2014-08-12 VITALS — BP 137/72 | HR 72 | Temp 97.8°F | Ht 63.0 in | Wt 174.6 lb

## 2014-08-12 DIAGNOSIS — R600 Localized edema: Secondary | ICD-10-CM

## 2014-08-12 NOTE — Progress Notes (Signed)
Subjective:     Patient ID: Kaitlyn Serrano, female   DOB: Jun 19, 1940, 74 y.o.   MRN: [id number]  HPI Pt with lower ext edema She has a hx of same but it has gotten worse Pt recently started OTC NSAIDS due to foot pain  Review of Systems  Constitutional: Negative.   Respiratory: Negative.   Cardiovascular: Positive for leg swelling. Negative for chest pain and palpitations.       Objective:   Physical Exam  Constitutional: She appears well-developed and well-nourished.  Nursing note and vitals reviewed. + pitting edema bilat R>L Good pulses to lower ext No skin breakdown     Assessment:     Bilat lower ext edema    Plan:     Pt is taking lasix only once a day so will increase to bid x 2 days OTC compression hose Elevation Hold further NSAIDS F/U prn

## 2014-08-12 NOTE — Patient Instructions (Signed)

## 2014-08-18 ENCOUNTER — Ambulatory Visit (INDEPENDENT_AMBULATORY_CARE_PROVIDER_SITE_OTHER): Payer: Medicare Other | Admitting: Cardiology

## 2014-08-18 ENCOUNTER — Encounter: Payer: Self-pay | Admitting: Cardiology

## 2014-08-18 VITALS — BP 131/78 | HR 69 | Ht 62.0 in | Wt 161.1 lb

## 2014-08-18 DIAGNOSIS — R079 Chest pain, unspecified: Secondary | ICD-10-CM

## 2014-08-18 DIAGNOSIS — R0602 Shortness of breath: Secondary | ICD-10-CM | POA: Diagnosis not present

## 2014-08-18 DIAGNOSIS — I10 Essential (primary) hypertension: Secondary | ICD-10-CM

## 2014-08-18 DIAGNOSIS — E785 Hyperlipidemia, unspecified: Secondary | ICD-10-CM

## 2014-08-18 DIAGNOSIS — M7989 Other specified soft tissue disorders: Secondary | ICD-10-CM

## 2014-08-18 NOTE — Patient Instructions (Signed)
Your physician recommends that you schedule a follow-up appointment in: 6 WEEKS WITH DR BRANCH  Your physician recommends that you continue on your current medications as directed. Please refer to the Current Medication list given to you today.  Your physician has requested that you have an echocardiogram. Echocardiography is a painless test that uses sound waves to create images of your heart. It provides your doctor with information about the size and shape of your heart and how well your heart's chambers and valves are working. This procedure takes approximately one hour. There are no restrictions for this procedure.  Thank you for choosing [facility]!!   

## 2014-08-18 NOTE — Progress Notes (Signed)
Clinical Summary Kaitlyn Serrano is a 74 y.o.female seen today for follow up of the following medical problems.   1 Chest pain - previous workup in 2012 per old [place] notes with negative myoview for ischemia. Echo showed normal LV function - last visit she was once again having chest pain symptoms - since last visit completed exercise MPI. Low risk Duke treadmill score of 6, no ischemia on imaging. - no pain since last visit  2. HTN - does not check regulalrly at home -compliant with meds, has not taken meds yet today.   3. Hyperlipidemia - compliant with simvastatin - 09/2013 TC 187 TG 79 HDL 75 LDL 96  4. Leg swelling - notes bilateral leg swelling over the last several weeks. She states seen by pcp and some though could be related to significant NSAID intake. She has also recently been on prednisone. Now off both with some improvement. Of note she was also started on started on norvasc 05/2014.   - no recent echo in system. Recent labs show normal renal function with Cr 0.77.  - mild SOB or DOE   Past Medical History  Diagnosis Date  . Sinus infection   . High cholesterol   . Insomnia   . Blood transfusion   . GERD (gastroesophageal reflux disease)   . Hemorrhoids   . Glaucoma   . Diverticulosis   . Hypertension      Allergies  Allergen Reactions  . Bee Venom Anaphylaxis  . Bee Venom Swelling  . Elavil [Amitriptyline Hcl] Other (See Comments)    confusion  . Elavil [Amitriptyline]   . Penicillins Other (See Comments)    "PEELS SKIN OFF MOUTH"  . Penicillins Rash    To mouth     Current Outpatient Prescriptions  Medication Sig Dispense Refill  . ALPRAZolam (XANAX) 0.5 MG tablet TAKE 1/2 TABLET TWICE DAILY AS NEEDED FOR SLEEP OR ANXIETY 45 tablet 0  . amLODipine (NORVASC) 10 MG tablet Take 1 tablet (10 mg total) by mouth daily. 180 tablet 3  . atenolol (TENORMIN) 50 MG tablet Take 1 tablet (50 mg total) by mouth daily. 90 tablet 4  . benzonatate  (TESSALON) 200 MG capsule Take 1 capsule (200 mg total) by mouth 3 (three) times daily as needed. 30 capsule 1  . cyclobenzaprine (FLEXERIL) 10 MG tablet Take 10 mg by mouth 2 (two) times daily as needed for muscle spasms.    Marland Kitchen EPINEPHrine 0.3 mg/0.3 mL IJ SOAJ injection Inject 0.3 mLs (0.3 mg total) into the muscle once. 2 Device 1  . Estradiol Acetate (FEMRING) 0.05 MG/24HR RING Place 0.05 each vaginally once. 3 each 4  . furosemide (LASIX) 40 MG tablet Take 1 tablet (40 mg total) by mouth 2 (two) times daily. (Patient taking differently: Take 40 mg by mouth daily. ) 180 tablet 3  . HYDROcodone-acetaminophen (NORCO) 10-325 MG per tablet Take 1 tablet by mouth every 6 (six) hours as needed. pain    . HYDROcodone-homatropine (HYCODAN) 5-1.5 MG/5ML syrup Take 5 mLs by mouth every 8 (eight) hours as needed for cough. 120 mL 0  . hydrOXYzine (ATARAX/VISTARIL) 25 MG tablet Take 1 tablet (25 mg total) by mouth 3 (three) times daily as needed. 30 tablet 1  . lactulose (CHRONULAC) 10 GM/15ML solution Take 30 mLs (20 g total) by mouth 2 (two) times daily as needed (constipation). 240 mL 3  . methylPREDNISolone (MEDROL DOSEPAK) 4 MG TBPK tablet Use as directed 21 tablet 0  .  pantoprazole (PROTONIX) 40 MG tablet Take 1 tablet (40 mg total) by mouth 2 (two) times daily. 180 tablet 3  . polyethylene glycol powder (GLYCOLAX/MIRALAX) powder Take 17 g by mouth 2 (two) times daily as needed. 3350 g 11  . promethazine (PHENERGAN) 12.5 MG tablet Take 1 tablet (12.5 mg total) by mouth every 8 (eight) hours as needed for nausea. 30 tablet 1  . simvastatin (ZOCOR) 20 MG tablet Take 1 tablet (20 mg total) by mouth at bedtime. 90 tablet 4  . traZODone (DESYREL) 50 MG tablet Take 1 tablet (50 mg total) by mouth at bedtime. 30 tablet 2  . Vitamin D, Ergocalciferol, (DRISDOL) 50000 UNITS CAPS capsule Take 1 capsule (50,000 Units total) by mouth every 7 (seven) days. 18 capsule 2   No current facility-administered medications  for this visit.     Past Surgical History  Procedure Laterality Date  . Cervical spine surgery    . Back surgery    . Abdominal hysterectomy       Allergies  Allergen Reactions  . Bee Venom Anaphylaxis  . Bee Venom Swelling  . Elavil [Amitriptyline Hcl] Other (See Comments)    confusion  . Elavil [Amitriptyline]   . Penicillins Other (See Comments)    "PEELS SKIN OFF MOUTH"  . Penicillins Rash    To mouth      Family History  Problem Relation Age of Onset  . Diabetes Mother   . Heart disease Mother   . Hypertension Mother   . Heart disease Father      Social History Ms. Kates reports that she has never smoked. She does not have any smokeless tobacco history on file. Ms. Moosman reports that she does not drink alcohol.   Review of Systems CONSTITUTIONAL: No weight loss, fever, chills, weakness or fatigue.  HEENT: Eyes: No visual loss, blurred vision, double vision or yellow sclerae.No hearing loss, sneezing, congestion, runny nose or sore throat.  SKIN: No rash or itching.  CARDIOVASCULAR: no chest pain, no palpitations RESPIRATORY: No shortness of breath, cough or sputum.  GASTROINTESTINAL: No anorexia, nausea, vomiting or diarrhea. No abdominal pain or blood.  GENITOURINARY: No burning on urination, no polyuria NEUROLOGICAL: No headache, dizziness, syncope, paralysis, ataxia, numbness or tingling in the extremities. No change in bowel or bladder control.  MUSCULOSKELETAL: + leg swelling LYMPHATICS: No enlarged nodes. No history of splenectomy.  PSYCHIATRIC: No history of depression or anxiety.  ENDOCRINOLOGIC: No reports of sweating, cold or heat intolerance. No polyuria or polydipsia.  Marland Kitchen   Physical Examination Filed Vitals:   08/18/14 1427  BP: 131/78  Pulse: 69   Filed Vitals:   08/18/14 1427  Height: 5\' 2"  (1.575 m)  Weight: 161 lb 1.9 oz (73.084 kg)    Gen: resting comfortably, no acute distress HEENT: no scleral icterus, pupils equal round  and reactive, no palptable cervical adenopathy,  CV: RRR, no m/r/g, no JVD Resp: Clear to auscultation bilaterally GI: abdomen is soft, non-tender, non-distended, normal bowel sounds, no hepatosplenomegaly MSK: extremities are warm, 1+ bilateral LE edema Skin: warm, no rash Neuro:  no focal deficits Psych: appropriate affect   Diagnostic Studies Echo 04/2010 [facility] LVEF >28%, grade I diastolic dysfunction, mild MR  04/2010 ExerciseMyoview No ischemia. Exercised 8 minutes, 9 METs, 95% THR.  12/2007 Renal artery [place] No stenosis, normal kidney size.   06/01/13 [facility] EKG NSR, LAD  05/2014 Exercise MPI IMPRESSION: 1. No reversible ischemia or infarction.  2. Normal left ventricular wall motion.  3.  Left ventricular ejection fraction 57%  4. Low-risk stress test findings*.  Assessment and Plan  1. Chest pain - recent negative exercise nuclear stress - symptoms have resolved - continue to follow clinically  2. HTN - well controlled, continue current meds.   3. Hyperlipidemia - at goal, continue current meds  4. Leg swelling - unclear etiology. Potentially related to recent NSAIDs or prednisone, we will follow off these meds. Could also be related to starting norvasc in April - will check echo to make sure no new cardiac dysfunction   F/u 6 weeks     Arnoldo Lenis, M.D.

## 2014-08-31 DIAGNOSIS — M47897 Other spondylosis, lumbosacral region: Secondary | ICD-10-CM | POA: Diagnosis not present

## 2014-08-31 DIAGNOSIS — G894 Chronic pain syndrome: Secondary | ICD-10-CM | POA: Diagnosis not present

## 2014-08-31 DIAGNOSIS — M542 Cervicalgia: Secondary | ICD-10-CM | POA: Diagnosis not present

## 2014-09-08 ENCOUNTER — Ambulatory Visit (INDEPENDENT_AMBULATORY_CARE_PROVIDER_SITE_OTHER): Payer: Medicare Other

## 2014-09-08 ENCOUNTER — Other Ambulatory Visit: Payer: Self-pay

## 2014-09-08 DIAGNOSIS — R0602 Shortness of breath: Secondary | ICD-10-CM | POA: Diagnosis not present

## 2014-09-23 ENCOUNTER — Encounter: Payer: Self-pay | Admitting: Cardiology

## 2014-09-23 ENCOUNTER — Ambulatory Visit (INDEPENDENT_AMBULATORY_CARE_PROVIDER_SITE_OTHER): Payer: Medicare Other | Admitting: Cardiology

## 2014-09-23 VITALS — BP 123/76 | HR 60 | Ht 62.0 in | Wt 160.0 lb

## 2014-09-23 DIAGNOSIS — M7989 Other specified soft tissue disorders: Secondary | ICD-10-CM

## 2014-09-23 NOTE — Progress Notes (Signed)
Patient ID: Kaitlyn Serrano, female   DOB: [birth year], 74 y.o.   MRN: [id number]     Clinical Summary Kaitlyn Serrano is a 74 y.o.female seen today for follow up of the following medical problems. This is a focused visit on her recent troubles with leg swelling.    1. Leg swelling - notes bilateral leg swelling over the last several weeks. She states seen by pcp and some though could be related to significant NSAID intake. She has also recently been on prednisone. Now off both with some improvement. Of note she was also started on started on norvasc 05/2014.   - since last visit completed echo that showed LVEF 62-69%, grade I diastolic dysfunction.  - she also stopped her norvasc on her own at home, since that time swelling has significantly improved   Past Medical History  Diagnosis Date  . Sinus infection   . High cholesterol   . Insomnia   . Blood transfusion   . GERD (gastroesophageal reflux disease)   . Hemorrhoids   . Glaucoma   . Diverticulosis   . Hypertension   . Visual disorder   . 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 Lumbago   . Chronic pain syndrome   . Spondylosis with myelopathy, lumbar region   . Intervertebral disc disorder with myelopathy, lumbar region   . Asthma   . CAD (coronary artery disease)   . CHF (congestive heart failure)   . Renal insufficiency   . Diabetes   . MI (myocardial infarction)   . Valvular heart disease   . Dysphagia   . Hearing loss   . Hoarseness   . Blurred vision   . Exposure to TB   . Leg swelling   . Irregular heart beat   . Palpitations   . Wheezing   . Vertigo   . Double vision   . Vision loss   . Cough   . Dyspnea   . Ringing in ears   . Nasal congestion   . Chest pain   . Cyanosis   . Heart murmur   . Syncope   . Night sweats   . Generalized weakness   . Facial pain   . Lumbar spine pain   . Rectal bleeding   . IBS (irritable bowel syndrome)   . Hemorrhoids   . GERD (gastroesophageal reflux disease)       Allergies  Allergen Reactions  . Bee Venom Anaphylaxis  . Bee Venom Swelling  . Elavil [Amitriptyline Hcl] Other (See Comments)    confusion  . Elavil [Amitriptyline]   . Penicillins Other (See Comments)    "PEELS SKIN OFF MOUTH"  . Penicillins Rash    To mouth     Current Outpatient Prescriptions  Medication Sig Dispense Refill  . ALPRAZolam (XANAX) 0.5 MG tablet TAKE 1/2 TABLET TWICE DAILY AS NEEDED FOR SLEEP OR ANXIETY 45 tablet 0  . amLODipine (NORVASC) 10 MG tablet Take 1 tablet (10 mg total) by mouth daily. 180 tablet 3  . atenolol (TENORMIN) 50 MG tablet Take 1 tablet (50 mg total) by mouth daily. 90 tablet 4  . benzonatate (TESSALON) 200 MG capsule Take 1 capsule (200 mg total) by mouth 3 (three) times daily as needed. 30 capsule 1  . cyclobenzaprine (FLEXERIL) 10 MG tablet Take 10 mg by mouth 2 (two) times daily as needed for muscle spasms.    Marland Kitchen EPINEPHrine 0.3 mg/0.3 mL IJ SOAJ injection Inject 0.3 mLs (0.3 mg total)  into the muscle once. (Patient taking differently: Inject 0.3 mg into the muscle once. AS NEEDED) 2 Device 1  . Estradiol Acetate (FEMRING) 0.05 MG/24HR RING Place 0.05 each vaginally once. 3 each 4  . furosemide (LASIX) 40 MG tablet Take 1 tablet (40 mg total) by mouth 2 (two) times daily. (Patient taking differently: Take 40 mg by mouth daily. ) 180 tablet 3  . HYDROcodone-acetaminophen (NORCO) 10-325 MG per tablet Take 1 tablet by mouth every 6 (six) hours as needed. pain    . HYDROcodone-homatropine (HYCODAN) 5-1.5 MG/5ML syrup Take 5 mLs by mouth every 8 (eight) hours as needed for cough. 120 mL 0  . hydrOXYzine (ATARAX/VISTARIL) 25 MG tablet Take 1 tablet (25 mg total) by mouth 3 (three) times daily as needed. 30 tablet 1  . lactulose (CHRONULAC) 10 GM/15ML solution Take 30 mLs (20 g total) by mouth 2 (two) times daily as needed (constipation). 240 mL 3  . pantoprazole (PROTONIX) 40 MG tablet Take 1 tablet (40 mg total) by mouth 2 (two) times daily.  180 tablet 3  . polyethylene glycol powder (GLYCOLAX/MIRALAX) powder Take 17 g by mouth 2 (two) times daily as needed. 3350 g 11  . promethazine (PHENERGAN) 12.5 MG tablet Take 1 tablet (12.5 mg total) by mouth every 8 (eight) hours as needed for nausea. 30 tablet 1  . simvastatin (ZOCOR) 20 MG tablet Take 1 tablet (20 mg total) by mouth at bedtime. 90 tablet 4  . traZODone (DESYREL) 50 MG tablet Take 1 tablet (50 mg total) by mouth at bedtime. 30 tablet 2  . Vitamin D, Ergocalciferol, (DRISDOL) 50000 UNITS CAPS capsule Take 1 capsule (50,000 Units total) by mouth every 7 (seven) days. 18 capsule 2   No current facility-administered medications for this visit.     Past Surgical History  Procedure Laterality Date  . Cervical spine surgery    . Back surgery    . Abdominal hysterectomy    . Bone marrow aspiration    . Spinal fusion    . Laminotomy    . Fixation kyphoplasty       Allergies  Allergen Reactions  . Bee Venom Anaphylaxis  . Bee Venom Swelling  . Elavil [Amitriptyline Hcl] Other (See Comments)    confusion  . Elavil [Amitriptyline]   . Penicillins Other (See Comments)    "PEELS SKIN OFF MOUTH"  . Penicillins Rash    To mouth      Family History  Problem Relation Age of Onset  . Diabetes Mother   . Heart disease Mother   . Hypertension Mother   . Gallbladder disease Mother   . Heart disease Father   . Gallbladder disease    . Pneumonia Sister   . Hypertension Brother   . Hyperlipidemia Brother   . Hypertension Child      Social History Kaitlyn Serrano reports that she quit smoking about 50 years ago. Her smoking use included Cigarettes. She has a 12 pack-year smoking history. She does not have any smokeless tobacco history on file. Kaitlyn Serrano reports that she does not drink alcohol.   Review of Systems CONSTITUTIONAL: No weight loss, fever, chills, weakness or fatigue.  HEENT: Eyes: No visual loss, blurred vision, double vision or yellow sclerae.No  hearing loss, sneezing, congestion, runny nose or sore throat.  SKIN: No rash or itching.  CARDIOVASCULAR: no chest pain, no palpitations RESPIRATORY: No shortness of breath, cough or sputum.  GASTROINTESTINAL: No anorexia, nausea, vomiting or diarrhea. No abdominal pain  or blood.  GENITOURINARY: No burning on urination, no polyuria NEUROLOGICAL: No headache, dizziness, syncope, paralysis, ataxia, numbness or tingling in the extremities. No change in bowel or bladder control.  MUSCULOSKELETAL: No muscle, back pain, joint pain or stiffness.  LYMPHATICS: No enlarged nodes. No history of splenectomy.  PSYCHIATRIC: No history of depression or anxiety.  ENDOCRINOLOGIC: No reports of sweating, cold or heat intolerance. No polyuria or polydipsia.  Marland Kitchen   Physical Examination Filed Vitals:   09/23/14 1429  BP: 123/76  Pulse: 60   Filed Vitals:   09/23/14 1429  Height: 5\' 2"  (1.575 m)  Weight: 160 lb (72.576 kg)    Gen: resting comfortably, no acute distress HEENT: no scleral icterus, pupils equal round and reactive, no palptable cervical adenopathy,  CV: RRR, no m/r/,g no JVD Resp: Clear to auscultation bilaterally GI: abdomen is soft, non-tender, non-distended, normal bowel sounds, no hepatosplenomegaly MSK: extremities are warm, no edema.  Skin: warm, no rash Neuro:  no focal deficits Psych: appropriate affect   Diagnostic Studies Echo 04/2010 [facility] LVEF >97%, grade I diastolic dysfunction, mild MR  04/2010 [facility] No ischemia. Exercised 8 minutes, 9 METs, 95% THR.  12/2007 Renal artery [place] No stenosis, normal kidney size.   06/01/13 [facility] EKG NSR, LAD  05/2014 Exercise MPI IMPRESSION: 1. No reversible ischemia or infarction.  2. Normal left ventricular wall motion.  3. Left ventricular ejection fraction 57%  4. Low-risk stress test findings*.   08/2014 Echo Study Conclusions  - Left ventricle: The cavity size was normal. Wall thickness  was normal. Systolic function was normal. The estimated ejection fraction was in the range of 60% to 65%. Wall motion was normal; there were no regional wall motion abnormalities. Doppler parameters are consistent with abnormal left ventricular relaxation (grade 1 diastolic dysfunction). - Aortic valve: Mildly calcified annulus. Trileaflet. - Mitral valve: Mildly calcified annulus. Mildly calcified leaflets . There was mild regurgitation. - Tricuspid valve: There was mild regurgitation.   Assessment and Plan   1. Leg swelling - resolved, appears to have been related to recently starting norvasc. She is back on dilt and tolerating that fine. Continue current meds     F/u 6 months  Arnoldo Lenis, M.D

## 2014-09-23 NOTE — Patient Instructions (Signed)
Your physician wants you to follow-up in: 6 months with Dr. Bryna Colander will receive a reminder letter in the mail two months in advance. If you don't receive a letter, please call our office to schedule the follow-up appointment.  Your physician recommends that you continue on your current medications as directed. Please refer to the Current Medication list given to you today.  WE HAVE UPDATED YOUR MEDICATION LIST TO REFLECT STOPPING  AMLODIPINE   WE HAVE GIVEN YOU YOUR LAB RESULTS AND INFORMATION ON HEART HEALTH DIET  Thank you for choosing [facility]!!

## 2014-09-28 ENCOUNTER — Other Ambulatory Visit: Payer: Self-pay | Admitting: Family

## 2014-09-28 ENCOUNTER — Other Ambulatory Visit: Payer: Self-pay | Admitting: Family Medicine

## 2014-09-30 DIAGNOSIS — G894 Chronic pain syndrome: Secondary | ICD-10-CM | POA: Diagnosis not present

## 2014-09-30 DIAGNOSIS — M542 Cervicalgia: Secondary | ICD-10-CM | POA: Diagnosis not present

## 2014-09-30 DIAGNOSIS — M545 Low back pain: Secondary | ICD-10-CM | POA: Diagnosis not present

## 2014-09-30 DIAGNOSIS — M47896 Other spondylosis, lumbar region: Secondary | ICD-10-CM | POA: Diagnosis not present

## 2014-10-20 DIAGNOSIS — H02055 Trichiasis without entropian left lower eyelid: Secondary | ICD-10-CM | POA: Diagnosis not present

## 2014-10-20 DIAGNOSIS — H02051 Trichiasis without entropian right upper eyelid: Secondary | ICD-10-CM | POA: Diagnosis not present

## 2014-10-21 ENCOUNTER — Other Ambulatory Visit: Payer: Self-pay | Admitting: Family Medicine

## 2014-10-21 ENCOUNTER — Other Ambulatory Visit: Payer: Self-pay | Admitting: Family

## 2014-10-21 NOTE — Telephone Encounter (Signed)
Last filled 06/03/14, last seen 08/04/14. Route to pool, nurse call in at [facility]

## 2014-11-01 ENCOUNTER — Other Ambulatory Visit: Payer: Self-pay | Admitting: Family Medicine

## 2014-11-03 DIAGNOSIS — Z79891 Long term (current) use of opiate analgesic: Secondary | ICD-10-CM | POA: Diagnosis not present

## 2014-11-03 DIAGNOSIS — M545 Low back pain: Secondary | ICD-10-CM | POA: Diagnosis not present

## 2014-11-03 DIAGNOSIS — Z79899 Other long term (current) drug therapy: Secondary | ICD-10-CM | POA: Diagnosis not present

## 2014-11-03 DIAGNOSIS — M5106 Intervertebral disc disorders with myelopathy, lumbar region: Secondary | ICD-10-CM | POA: Diagnosis not present

## 2014-11-03 DIAGNOSIS — G894 Chronic pain syndrome: Secondary | ICD-10-CM | POA: Diagnosis not present

## 2014-11-14 ENCOUNTER — Ambulatory Visit (INDEPENDENT_AMBULATORY_CARE_PROVIDER_SITE_OTHER): Payer: Medicare Other | Admitting: Family Medicine

## 2014-11-14 ENCOUNTER — Telehealth: Payer: Self-pay | Admitting: Family

## 2014-11-14 ENCOUNTER — Encounter: Payer: Self-pay | Admitting: Family Medicine

## 2014-11-14 VITALS — BP 146/64 | HR 60 | Temp 98.2°F | Ht 62.0 in | Wt 156.0 lb

## 2014-11-14 DIAGNOSIS — W57XXXA Bitten or stung by nonvenomous insect and other nonvenomous arthropods, initial encounter: Secondary | ICD-10-CM

## 2014-11-14 DIAGNOSIS — S80921A Unspecified superficial injury of right lower leg, initial encounter: Secondary | ICD-10-CM

## 2014-11-14 DIAGNOSIS — T63463S Toxic effect of venom of wasps, assault, sequela: Secondary | ICD-10-CM | POA: Diagnosis not present

## 2014-11-14 MED ORDER — METHYLPREDNISOLONE ACETATE 80 MG/ML IJ SUSP
60.0000 mg | Freq: Once | INTRAMUSCULAR | Status: AC
  Administered 2014-11-14: 60 mg via INTRAMUSCULAR

## 2014-11-14 MED ORDER — HYDROXYZINE HCL 25 MG PO TABS: 25.0000 mg | ORAL_TABLET | Freq: Three times a day (TID) | ORAL | Status: DC | PRN

## 2014-11-14 MED ORDER — PREDNISONE 10 MG PO TABS: ORAL_TABLET | ORAL | Status: DC

## 2014-11-14 NOTE — Addendum Note (Signed)
Addended by: Zannie Cove on: 11/14/2014 04:15 PM   Modules accepted: Orders

## 2014-11-14 NOTE — Telephone Encounter (Signed)
appt given  

## 2014-11-14 NOTE — Progress Notes (Signed)
Subjective:    Patient ID: Kaitlyn Serrano, female    DOB: 06/24/1940, 74 y.o.   MRN: [id number]  HPI Patient here today for a rash on her right leg after working in her yard. This patient does have a history of a lot of allergies including drug and B's. Some of these allergies were severe especially the be allergy. This occurred after working in the yard yesterday and it is worse on her right leg.      Patient Active Problem List   Diagnosis Date Noted  . GAD (generalized anxiety disorder) 10/08/2013  . Unspecified vitamin D deficiency 10/08/2013  . GERD (gastroesophageal reflux disease) 10/08/2013  . Atypical chest pain 06/01/2013  . Hypertension 08/13/2012  . Hyperlipidemia 08/13/2012  . Sore throat 06/30/2012  . Seasonal allergic rhinitis 06/30/2012  . Wheezing 06/30/2012  . Hemorrhoids 06/25/2011   Outpatient Encounter Prescriptions as of 11/14/2014  Medication Sig  . ALPRAZolam (XANAX) 0.5 MG tablet TAKE 1/2 TABLET TWICE DAILY AS NEEDED FOR SLEEP OR ANXIETY  . atenolol (TENORMIN) 50 MG tablet Take 1 tablet (50 mg total) by mouth daily.  . cyclobenzaprine (FLEXERIL) 10 MG tablet Take 10 mg by mouth 2 (two) times daily as needed for muscle spasms.  Marland Kitchen diltiazem (CARDIZEM CD) 240 MG 24 hr capsule Take 240 mg by mouth every morning.  Marland Kitchen EPINEPHrine 0.3 mg/0.3 mL IJ SOAJ injection Inject 0.3 mLs (0.3 mg total) into the muscle once. (Patient taking differently: Inject 0.3 mg into the muscle once. AS NEEDED)  . Estradiol Acetate (FEMRING) 0.05 MG/24HR RING Place 0.05 each vaginally once.  . furosemide (LASIX) 40 MG tablet Take 40 mg by mouth daily.  Marland Kitchen HYDROcodone-acetaminophen (NORCO) 10-325 MG per tablet Take 1 tablet by mouth every 6 (six) hours as needed. pain  . HYDROcodone-homatropine (HYCODAN) 5-1.5 MG/5ML syrup Take 5 mLs by mouth every 8 (eight) hours as needed for cough.  . hydrOXYzine (ATARAX/VISTARIL) 25 MG tablet Take 1 tablet (25 mg total) by mouth 3 (three) times daily  as needed.  . lactulose (CHRONULAC) 10 GM/15ML solution Take 30 mLs (20 g total) by mouth 2 (two) times daily as needed (constipation).  . pantoprazole (PROTONIX) 40 MG tablet Take 1 tablet (40 mg total) by mouth 2 (two) times daily.  . polyethylene glycol powder (GLYCOLAX/MIRALAX) powder Take 17 g by mouth 2 (two) times daily as needed.  . promethazine (PHENERGAN) 12.5 MG tablet Take 1 tablet (12.5 mg total) by mouth every 8 (eight) hours as needed for nausea.  . simvastatin (ZOCOR) 20 MG tablet Take 1 tablet (20 mg total) by mouth at bedtime.  . traZODone (DESYREL) 50 MG tablet TAKE ONE TABLET AT BEDTIME  . Vitamin D, Ergocalciferol, (DRISDOL) 50000 UNITS CAPS capsule TAKE 1 CAPSULE ONCE A WEEK  . [DISCONTINUED] benzonatate (TESSALON) 200 MG capsule Take 1 capsule (200 mg total) by mouth 3 (three) times daily as needed.  . [DISCONTINUED] diltiazem (CARDIZEM CD) 120 MG 24 hr capsule TAKE ONE CAPSULE AT BEDTIME   No facility-administered encounter medications on file as of 11/14/2014.     Review of Systems  Constitutional: Negative.   HENT: Negative.   Eyes: Negative.   Respiratory: Negative.   Cardiovascular: Negative.   Gastrointestinal: Negative.   Endocrine: Negative.   Genitourinary: Negative.   Musculoskeletal: Negative.   Skin: Positive for rash (right leg only).  Allergic/Immunologic: Negative.   Neurological: Negative.   Hematological: Negative.   Psychiatric/Behavioral: Negative.        Objective:  Physical Exam  Constitutional: She is oriented to person, place, and time. She appears well-developed and well-nourished. No distress.  HENT:  Head: Normocephalic.  Eyes: Conjunctivae and EOM are normal. Pupils are equal, round, and reactive to light. Right eye exhibits no discharge. Left eye exhibits no discharge.  Neck: Normal range of motion.  Musculoskeletal: Normal range of motion. She exhibits no edema.  Neurological: She is alert and oriented to person, place, and  time.  Skin: Skin is warm and dry. Rash noted.  Multiple bites on the popliteal fossa lower leg on the right and thigh area. Just on the right side.  Psychiatric: She has a normal mood and affect. Her behavior is normal. Judgment and thought content normal.  Nursing note and vitals reviewed.    BP 146/64 mmHg  Pulse 60  Temp(Src) 98.2 F (36.8 C) (Oral)  Ht 5\' 2"  (1.575 m)  Wt 156 lb (70.761 kg)  BMI 28.53 kg/m2      Assessment & Plan:  1. Insect bites -Take prednisone as directed -Take Atarax as needed for itching -In the future use Deep woods off prior to exposure to infested area - methylPREDNISolone acetate (DEPO-MEDROL) injection 60 mg; Inject 0.75 mLs (60 mg total) into the muscle once. - predniSONE (DELTASONE) 10 MG tablet; 1 tablet 4 times a day for 2 days,  1 tablet 3 times a day for 2 days,  1 tablet 2 times a day for 2 days, 1 tablet daily for 2 days  Dispense: 20 tablet; Refill: 0  Patient Instructions  In the future use deep Woods off Spray this on your lower extremities and under your socks and on her lower legs to help prevent these bug bites in the future Take Atarax or hydroxyzine as needed for itching Take prednisone as directed This may take a week to settle down but hopefully between the Atarax and the prednisone this will settle some of the itching down also   Arrie Senate MD

## 2014-11-14 NOTE — Patient Instructions (Signed)
In the future use deep Woods off Spray this on your lower extremities and under your socks and on her lower legs to help prevent these bug bites in the future Take Atarax or hydroxyzine as needed for itching Take prednisone as directed This may take a week to settle down but hopefully between the Atarax and the prednisone this will settle some of the itching down also

## 2014-11-15 DIAGNOSIS — Z23 Encounter for immunization: Secondary | ICD-10-CM | POA: Diagnosis not present

## 2014-11-23 ENCOUNTER — Encounter: Payer: Self-pay | Admitting: Physician Assistant

## 2014-11-23 ENCOUNTER — Ambulatory Visit (INDEPENDENT_AMBULATORY_CARE_PROVIDER_SITE_OTHER): Payer: Medicare Other | Admitting: Physician Assistant

## 2014-11-23 VITALS — BP 141/74 | HR 60 | Temp 97.6°F | Ht 62.0 in | Wt 155.0 lb

## 2014-11-23 DIAGNOSIS — M940 Chondrocostal junction syndrome [Tietze]: Secondary | ICD-10-CM

## 2014-11-23 NOTE — Patient Instructions (Signed)
Costochondritis Costochondritis, sometimes called Tietze syndrome, is a swelling and irritation (inflammation) of the tissue (cartilage) that connects your ribs with your breastbone (sternum). It causes pain in the chest and rib area. Costochondritis usually goes away on its own over time. It can take up to 6 weeks or longer to get better, especially if you are unable to limit your activities. CAUSES  Some cases of costochondritis have no known cause. Possible causes include:  Injury (trauma).  Exercise or activity such as lifting.  Severe coughing. SIGNS AND SYMPTOMS  Pain and tenderness in the chest and rib area.  Pain that gets worse when coughing or taking deep breaths.  Pain that gets worse with specific movements. DIAGNOSIS  Your health care Brilee Port will do a physical exam and ask about your symptoms. Chest X-rays or other tests may be done to rule out other problems. TREATMENT  Costochondritis usually goes away on its own over time. Your health care Elizardo Chilson may prescribe medicine to help relieve pain. HOME CARE INSTRUCTIONS   Avoid exhausting physical activity. Try not to strain your ribs during normal activity. This would include any activities using chest, abdominal, and side muscles, especially if heavy weights are used.  Apply ice to the affected area for the first 2 days after the pain begins.  Put ice in a plastic bag.  Place a towel between your skin and the bag.  Leave the ice on for 20 minutes, 2-3 times a day.  Only take over-the-counter or prescription medicines as directed by your health care Yosmar Ryker. SEEK MEDICAL CARE IF:  You have redness or swelling at the rib joints. These are signs of infection.  Your pain does not go away despite rest or medicine. SEEK IMMEDIATE MEDICAL CARE IF:   Your pain increases or you are very uncomfortable.  You have shortness of breath or difficulty breathing.  You cough up blood.  You have worse chest pains,  sweating, or vomiting.  You have a fever or persistent symptoms for more than 2-3 days.  You have a fever and your symptoms suddenly get worse. MAKE SURE YOU:   Understand these instructions.  Will watch your condition.  Will get help right away if you are not doing well or get worse. Document Released: 11/21/2004 Document Revised: 12/02/2012 Document Reviewed: 09/15/2012 ExitCare Patient Information 2015 [employer]. This information is not intended to replace advice given to you by your health care Lafern Brinkley. Make sure you discuss any questions you have with your health care Shatara Stanek.  

## 2014-11-23 NOTE — Progress Notes (Signed)
   Subjective:    Patient ID: Kaitlyn Serrano, female    DOB: [birth year], 74 y.o.   MRN: [id number]  HPI 74 y/o female presents with c/o hurting in lower lungs with breathing. Pain is worse in back.     Review of Systems  Constitutional: Positive for diaphoresis.  HENT: Positive for congestion.   Respiratory: Positive for cough and shortness of breath. Negative for wheezing.   Gastrointestinal: Positive for nausea.       Objective:   Physical Exam  Cardiovascular: Normal rate, regular rhythm and intact distal pulses.  Exam reveals friction rub. Exam reveals no gallop.   No murmur heard. Pulmonary/Chest: Effort normal. No respiratory distress. She has no wheezes. She has no rales. She exhibits no tenderness.  Musculoskeletal: She exhibits tenderness.  Nursing note and vitals reviewed.         Assessment & Plan:  1. Costochondritis - Rest, ice packs alternatig with heating pad TID - DG Chest 2 View; Future     Tiffany A. Benjamin Stain PA-C

## 2014-11-24 ENCOUNTER — Other Ambulatory Visit (INDEPENDENT_AMBULATORY_CARE_PROVIDER_SITE_OTHER): Payer: Medicare Other

## 2014-11-24 ENCOUNTER — Other Ambulatory Visit: Payer: Self-pay | Admitting: Family

## 2014-11-24 DIAGNOSIS — M940 Chondrocostal junction syndrome [Tietze]: Secondary | ICD-10-CM

## 2014-11-25 NOTE — Addendum Note (Signed)
Addended by: Marline Backbone A on: 11/25/2014 06:09 PM   Modules accepted: Level of Service

## 2014-12-12 ENCOUNTER — Other Ambulatory Visit: Payer: Self-pay | Admitting: Family

## 2014-12-21 ENCOUNTER — Encounter: Payer: Self-pay | Admitting: Gastroenterology

## 2015-01-09 ENCOUNTER — Ambulatory Visit (INDEPENDENT_AMBULATORY_CARE_PROVIDER_SITE_OTHER): Payer: Medicare Other | Admitting: Physician Assistant

## 2015-01-09 ENCOUNTER — Ambulatory Visit (INDEPENDENT_AMBULATORY_CARE_PROVIDER_SITE_OTHER)
Admission: RE | Admit: 2015-01-09 | Discharge: 2015-01-09 | Disposition: A | Discharge status: Home / Self Care | Payer: Medicare Other | Source: Ambulatory Visit | Attending: Physician Assistant | Admitting: Physician Assistant

## 2015-01-09 ENCOUNTER — Other Ambulatory Visit: Payer: Self-pay | Admitting: *Deleted

## 2015-01-09 ENCOUNTER — Encounter: Payer: Self-pay | Admitting: Physician Assistant

## 2015-01-09 ENCOUNTER — Other Ambulatory Visit (INDEPENDENT_AMBULATORY_CARE_PROVIDER_SITE_OTHER): Payer: Medicare Other

## 2015-01-09 VITALS — BP 160/80 | HR 68 | Ht 62.0 in | Wt 155.0 lb

## 2015-01-09 DIAGNOSIS — R1013 Epigastric pain: Secondary | ICD-10-CM | POA: Diagnosis not present

## 2015-01-09 DIAGNOSIS — G8929 Other chronic pain: Secondary | ICD-10-CM

## 2015-01-09 DIAGNOSIS — K589 Irritable bowel syndrome without diarrhea: Secondary | ICD-10-CM

## 2015-01-09 DIAGNOSIS — R110 Nausea: Secondary | ICD-10-CM

## 2015-01-09 DIAGNOSIS — R1032 Left lower quadrant pain: Secondary | ICD-10-CM

## 2015-01-09 DIAGNOSIS — K5730 Diverticulosis of large intestine without perforation or abscess without bleeding: Secondary | ICD-10-CM | POA: Diagnosis not present

## 2015-01-09 DIAGNOSIS — R634 Abnormal weight loss: Secondary | ICD-10-CM

## 2015-01-09 DIAGNOSIS — K5900 Constipation, unspecified: Secondary | ICD-10-CM | POA: Diagnosis not present

## 2015-01-09 LAB — URINALYSIS
BILIRUBIN URINE: NEGATIVE
HGB URINE DIPSTICK: NEGATIVE
Ketones, ur: NEGATIVE
LEUKOCYTES UA: NEGATIVE
Nitrite: NEGATIVE
SPECIFIC GRAVITY, URINE: 1.01 (ref 1.000–1.030)
Total Protein, Urine: NEGATIVE
Urine Glucose: NEGATIVE
Urobilinogen, UA: 0.2 (ref 0.0–1.0)
pH: 6.5 (ref 5.0–8.0)

## 2015-01-09 LAB — CBC WITH DIFFERENTIAL/PLATELET
BASOS ABS: 0 10*3/uL (ref 0.0–0.1)
BASOS PCT: 0.4 % (ref 0.0–3.0)
EOS ABS: 0.4 10*3/uL (ref 0.0–0.7)
Eosinophils Relative: 3.5 % (ref 0.0–5.0)
HCT: 43.2 % (ref 36.0–46.0)
Hemoglobin: 14.6 g/dL (ref 12.0–15.0)
LYMPHS ABS: 1.4 10*3/uL (ref 0.7–4.0)
Lymphocytes Relative: 11.3 % — ABNORMAL LOW (ref 12.0–46.0)
MCHC: 33.7 g/dL (ref 30.0–36.0)
MCV: 88.8 fl (ref 78.0–100.0)
Monocytes Absolute: 1 10*3/uL (ref 0.1–1.0)
Monocytes Relative: 8.2 % (ref 3.0–12.0)
NEUTROS ABS: 9.5 10*3/uL — AB (ref 1.4–7.7)
NEUTROS PCT: 76.6 % (ref 43.0–77.0)
Platelets: 217 10*3/uL (ref 150.0–400.0)
RBC: 4.86 Mil/uL (ref 3.87–5.11)
RDW: 13.8 % (ref 11.5–15.5)
WBC: 12.4 10*3/uL — ABNORMAL HIGH (ref 4.0–10.5)

## 2015-01-09 LAB — HEPATIC FUNCTION PANEL
ALT: 13 U/L (ref 0–35)
AST: 16 U/L (ref 0–37)
Albumin: 3.8 g/dL (ref 3.5–5.2)
Alkaline Phosphatase: 85 U/L (ref 39–117)
BILIRUBIN DIRECT: 0.1 mg/dL (ref 0.0–0.3)
BILIRUBIN TOTAL: 0.7 mg/dL (ref 0.2–1.2)
Total Protein: 7 g/dL (ref 6.0–8.3)

## 2015-01-09 LAB — BASIC METABOLIC PANEL
BUN: 11 mg/dL (ref 6–23)
CALCIUM: 9.5 mg/dL (ref 8.4–10.5)
CO2: 32 meq/L (ref 19–32)
CREATININE: 0.62 mg/dL (ref 0.40–1.20)
Chloride: 103 mEq/L (ref 96–112)
GFR: 99.85 mL/min (ref >60.00)
GLUCOSE: 108 mg/dL — AB (ref 70–99)
Potassium: 3.6 mEq/L (ref 3.5–5.1)
Sodium: 142 mEq/L (ref 135–145)

## 2015-01-09 LAB — LIPASE: Lipase: 8 U/L — ABNORMAL LOW (ref 11.0–59.0)

## 2015-01-09 LAB — TSH: TSH: 0.8 u[IU]/mL (ref 0.35–4.50)

## 2015-01-09 MED ORDER — IOHEXOL 300 MG/ML  SOLN
100.0000 mL | Freq: Once | INTRAMUSCULAR | Status: AC | PRN
  Administered 2015-01-09: 100 mL via INTRAVENOUS

## 2015-01-09 MED ORDER — LACTULOSE 10 GM/15ML PO SOLN: 10.0000 g | Freq: Two times a day (BID) | ORAL | Status: DC

## 2015-01-09 MED ORDER — METRONIDAZOLE 500 MG PO TABS: 500.0000 mg | ORAL_TABLET | Freq: Three times a day (TID) | ORAL | Status: DC

## 2015-01-09 MED ORDER — CIPROFLOXACIN HCL 500 MG PO TABS: 500.0000 mg | ORAL_TABLET | Freq: Two times a day (BID) | ORAL | Status: DC

## 2015-01-09 NOTE — Progress Notes (Addendum)
Reviewed and agree with management plan.  Pricilla Riffle. Fuller Plan, MD [facility]     Addendum 01/31/2015. Received notes from [facility] endoscopy center. Patient had a colonoscopy in 11/20/2011. External and internal hemorrhoids were noted. The examined portion of the ileum was normal. Diverticulosis in the sigmoid and in the distal descending colon. The entire examined colon was normal. Biopsies revealed benign colonic mucosa with no significant inflammation or other abnormalities identified. Colon biopsies from prior procedure on 05/03/2009 revealed benign colonic mucosa with melanosis coli.

## 2015-01-09 NOTE — Patient Instructions (Addendum)
Your physician has requested that you go to the basement for the lab work before leaving today.  We have sent the following medications to your pharmacy for you to pick up at your convenience: LaculoseContinue Protonix twice a day.  You have been scheduled for a CT scan of the abdomen and pelvis at [place] ([address]---this is in the same building as Press [facility]).   You are scheduled on 01-09-2015 at 1:15pm. You should arrive 15 minutes prior to your appointment time for registration. Please follow the written instructions below on the day of your exam:  WARNING: IF YOU ARE ALLERGIC TO IODINE/X-RAY DYE, PLEASE NOTIFY RADIOLOGY IMMEDIATELY AT [phone]! YOU WILL BE GIVEN A 13 HOUR PREMEDICATION PREP.  1) Do not eat or drink anything after 9am (4 hours prior to your test) 2) You have been given 2 bottles of oral contrast to drink. The solution may taste better if refrigerated, but do NOT add ice or any other liquid to this solution. Shake well before drinking.    Drink 1 bottle of contrast @ 11:15am (2 hours prior to your exam)  Drink 1 bottle of contrast @ 12:15am (1 hour prior to your exam)  You may take any medications as prescribed with a small amount of water except for the following: Metformin, Glucophage, Glucovance, Avandamet, Riomet, Fortamet, Actoplus Met, Janumet, Glumetza or Metaglip. The above medications must be held the day of the exam AND 48 hours after the exam.  The purpose of you drinking the oral contrast is to aid in the visualization of your intestinal tract. The contrast solution may cause some diarrhea. Before your exam is started, you will be given a small amount of fluid to drink. Depending on your individual set of symptoms, you may also receive an intravenous injection of x-ray contrast/dye. Plan on being at [facility] for 30 minutes or long, depending on the type of exam you are having performed.  This test typically takes 30-45  minutes to complete.  If you have any questions regarding your exam or if you need to reschedule, you may call the CT department at [phone] between the hours of 8:00 am and 5:00 pm, Monday-Friday.  Please call the office [phone] to make a 2 week follow up.  ________________________________________________________________________

## 2015-01-09 NOTE — Progress Notes (Signed)
Patient ID: Kaitlyn Serrano, female   DOB: 1940-05-27, 74 y.o.   MRN: [id number]    HPI:  Kaitlyn Serrano is a 74 y.o.   female referred by Lodema Pilot, PA-C for evaluation of abdominal pain. She only has a history of valvular heart disease, myocardial infarction, diabetes, renal insufficiency, CHF, CAD, asthma, chronic pain syndrome, hypertension, diverticulosis, glaucoma, hemorrhoids, high cholesterol, and insomnia. Kaitlyn Serrano has been seen by Dr. Fuller Plan in the 1990s. She had a colonoscopy in August 1999 that revealed diverticulosis, external hemorrhoids, and hemorrhoidal tags. Shortly after that, her insurance changed and she was seen several times by Dr. Watt Climes  of [facility]. She says she had a colonoscopy in the early 2000 at which time 3 polyps were removed. She does not know the pathology but says she was advised to have a repeat in 5 years and never did so. She also reports that she had an upper endoscopy that was normal. Recently, she has changed all of her physicians to [facility] and is here to reestablish care. She has been on lactulose for constipation for years. She also reports that she has seen Dr. Zella Richer long ago for hemorrhoids.  Kaitlyn Serrano reports that she has been having epigastric pain fairly constantly for about a year. Her pain is worse on an empty stomach, temporary alleviated with ingestion of food, and then it comes back. Her pain is sometimes associated with nausea which is diminished when she eats. He has not had any vomiting. She has a long-standing history of GERD and has been on twice a day pantoprazole. Several weeks ago she stopped taking her pantoprazole for 2 weeks and says her pain got worse, so she restarted the pantoprazole and her pain has diminished. Her main complaint today though, his left lower quadrant pain of 3 weeks' duration. She states the pain started about 3 weeks ago as a dull ache and has progressed in intensity. It now hurts when she walks. Her  stools have been alternating between formed and loose. She has been having chills for the past 3 nights with waves of nausea. She has had no bright red blood per rectum or melena. She thinks she has diverticulitis again. She has been having some dysuria and foul smelling urine. She also reports that she has lost about 20 pounds over the past 12-14 months unintentionally. She reports that her maternal grandmother and maternal aunt had colon cancer. She has a brother with severe diverticular disease.   Past Medical History  Diagnosis Date  . Sinus infection   . High cholesterol   . Insomnia   . Blood transfusion   . GERD (gastroesophageal reflux disease)   . Hemorrhoids   . Glaucoma   . Diverticulosis   . Hypertension   . Visual disorder   . 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 Lumbago   . Chronic pain syndrome   . Spondylosis with myelopathy, lumbar region   . Intervertebral disc disorder with myelopathy, lumbar region   . Asthma   . CAD (coronary artery disease)   . CHF (congestive heart failure) ([place])   . Renal insufficiency   . Diabetes ([place])   . MI (myocardial infarction) ([place])   . Valvular heart disease   . Dysphagia   . Hearing loss   . Hoarseness   . Blurred vision   . Exposure to TB   . Leg swelling   . Irregular heart beat   . Palpitations   . Wheezing   .  Vertigo   . Double vision   . Vision loss   . Cough   . Dyspnea   . Ringing in ears   . Nasal congestion   . Chest pain   . Cyanosis   . Heart murmur   . Syncope   . Night sweats   . Generalized weakness   . Facial pain   . Lumbar spine pain   . Rectal bleeding   . IBS (irritable bowel syndrome)   . Hemorrhoids   . GERD (gastroesophageal reflux disease)     Past Surgical History  Procedure Laterality Date  . Cervical spine surgery    . Back surgery    . Abdominal hysterectomy    . Bone marrow aspiration    . Spinal fusion    . Laminotomy    . Fixation kyphoplasty     Family History    Problem Relation Age of Onset  . Diabetes Mother   . Heart disease Mother   . Hypertension Mother   . Gallbladder disease Mother   . Heart disease Father   . Gallbladder disease    . Pneumonia Sister   . Hypertension Brother   . Hyperlipidemia Brother   . Hypertension Child    Social History  Substance Use Topics  . Smoking status: Former Smoker -- 4.00 packs/day for 3 years    Types: Cigarettes    Quit date: 02/20/1964  . Smokeless tobacco: None  . Alcohol Use: No   Current Outpatient Prescriptions  Medication Sig Dispense Refill  . ALPRAZolam (XANAX) 0.5 MG tablet TAKE 1/2 TABLET TWICE DAILY AS NEEDED FOR SLEEP OR ANXIETY 60 tablet 0  . atenolol (TENORMIN) 50 MG tablet Take 1 tablet (50 mg total) by mouth daily. 90 tablet 4  . cyclobenzaprine (FLEXERIL) 10 MG tablet Take 10 mg by mouth 2 (two) times daily as needed for muscle spasms.    Marland Kitchen diltiazem (CARDIZEM CD) 240 MG 24 hr capsule Take 240 mg by mouth every morning.    Marland Kitchen EPINEPHrine 0.3 mg/0.3 mL IJ SOAJ injection Inject 0.3 mLs (0.3 mg total) into the muscle once. (Patient taking differently: Inject 0.3 mg into the muscle once. AS NEEDED) 2 Device 1  . FEMRING 0.05 MG/24HR RING INSERT (1) INTO THE VAGINA AS DIR- ECTED. -FOR VAGINAL USE- 1 each 0  . furosemide (LASIX) 40 MG tablet Take 40 mg by mouth daily.    Marland Kitchen HYDROcodone-homatropine (HYCODAN) 5-1.5 MG/5ML syrup Take 5 mLs by mouth every 8 (eight) hours as needed for cough. 120 mL 0  . hydrOXYzine (ATARAX/VISTARIL) 25 MG tablet Take 1 tablet (25 mg total) by mouth 3 (three) times daily as needed. 30 tablet 1  . lactulose (CHRONULAC) 10 GM/15ML solution Take 30 mLs (20 g total) by mouth 2 (two) times daily as needed (constipation). 240 mL 3  . pantoprazole (PROTONIX) 40 MG tablet TAKE  (1)  TABLET TWICE A DAY. 180 tablet 0  . polyethylene glycol powder (GLYCOLAX/MIRALAX) powder Take 17 g by mouth 2 (two) times daily as needed. 3350 g 11  . promethazine (PHENERGAN) 12.5 MG  tablet Take 1 tablet (12.5 mg total) by mouth every 8 (eight) hours as needed for nausea. 30 tablet 1  . simvastatin (ZOCOR) 20 MG tablet Take 1 tablet (20 mg total) by mouth at bedtime. 90 tablet 4  . traZODone (DESYREL) 50 MG tablet TAKE ONE TABLET AT BEDTIME 90 tablet 1  . Vitamin D, Ergocalciferol, (DRISDOL) 50000 UNITS CAPS capsule TAKE 1 CAPSULE  ONCE A WEEK 4 capsule 0  . lactulose (CHRONULAC) 10 GM/15ML solution Take 15 mLs (10 g total) by mouth 2 (two) times daily. 240 mL 0   No current facility-administered medications for this visit.   Allergies  Allergen Reactions  . Bee Venom Anaphylaxis  . Amlodipine     swelling  . Bee Venom Swelling  . Elavil [Amitriptyline Hcl] Other (See Comments)    confusion  . Elavil [Amitriptyline]   . Penicillins Other (See Comments)    "PEELS SKIN OFF MOUTH"  . Penicillins Rash    To mouth     Review of Systems: Gen: Denies any fever, chills, sweats, anorexia, fatigue, weakness, malaise,and sleep disorder. Admits to unintentional weight loss. CV: Denies chest pain, angina, palpitations, syncope, orthopnea, PND, peripheral edema, and claudication. Resp: Denies dyspnea at rest, dyspnea with exercise, cough, sputum, wheezing, coughing up blood, and pleurisy. GI: Denies vomiting blood, jaundice, and fecal incontinence.   Denies dysphagia or odynophagia. GU : Denies urinary burning, blood in urine, urinary frequency, urinary hesitancy, nocturnal urination, and urinary incontinence. MS: Denies joint pain, limitation of movement, and swelling, stiffness, low back pain, extremity pain. Denies muscle weakness, cramps, atrophy.  Derm: Denies rash, itching, dry skin, hives, moles, warts, or unhealing ulcers.  Psych: Denies depression, anxiety, memory loss, suicidal ideation, hallucinations, paranoia, and confusion. Heme: Denies bruising, bleeding, and enlarged lymph nodes. Neuro:  Denies any headaches, dizziness, paresthesias. Endo:  Denies any problems  with DM, thyroid, adrenal function  Studies: No results found.    Prior Endoscopies:  See history of present illness  Physical Exam: BP 160/80 mmHg  Pulse 68  Ht 5\' 2"  (1.575 m)  Wt 155 lb (70.308 kg)  BMI 28.34 kg/m2 Constitutional: Pleasant,well-developed, Caucasian female in no acute distress. HEENT: Normocephalic and atraumatic. Conjunctivae are normal. No scleral icterus. Neck supple. No thyromegaly Cardiovascular: Normal rate, regular rhythm.  Pulmonary/chest: Effort normal and breath sounds normal. No wheezing, rales or rhonchi. Abdominal: Soft, nondistended, tender to palpation left lower quadrant and suprapubic area with guarding but no rebound. Bowel sounds active throughout. There are no masses palpable. No hepatomegaly. Extremities: no edema Lymphadenopathy: No cervical adenopathy noted. Neurological: Alert and oriented to person place and time. Skin: Skin is warm and dry. No rashes noted. Psychiatric: Normal mood and affect. Behavior is normal.  ASSESSMENT AND PLAN: 74 year old female with a history of diverticular disease presenting with a three-week history of left lower quadrant pain now associated with chills, nausea, and dysuria. A CBC with differential and basic metabolic panel will be obtained along with a urinalysis and urine culture and sensitivity. She will be scheduled for CT of the abdomen and pelvis. If positive for diverticulitis, she will be treated accordingly. If negative she may be a candidate for low dose antispasmodics though this would need to be minimized due to her glaucoma. She has signed a medical release form to get prior records from [facility]. She will continue lactulose for her constipation.  Her long-standing nausea and epigastric pain may be due to some poorly controlled reflux and she's been instructed to continue an anti-reflux regimen and daily use of pantoprazole. A lipase, and hepatic function panel will be obtained. Her CT  scan may shed some light on etiology as well.  We will speak to the patient once we receive her CT scan reports and give her further recommendations. Otherwise she will follow up in 2 weeks at which time she will likely need to be scheduled for a  colonoscopy ( based on colonoscopy and pathology reports from [facility]) and possible EGD as well.    Gaberial Cada, Vita Barley PA-C 01/09/2015, 11:30 AM  CC: Lodema Pilot, PA-C

## 2015-01-10 ENCOUNTER — Other Ambulatory Visit: Payer: Self-pay | Admitting: Family

## 2015-01-10 LAB — CULTURE, URINE COMPREHENSIVE
Colony Count: NO GROWTH
Organism ID, Bacteria: NO GROWTH

## 2015-01-10 NOTE — Telephone Encounter (Signed)
Patient has been in for a few quick acute visits for rash or cold etc. However she has not had a checkup for health maintenance including blood pressure evaluation in nearly 2 years. She will need an appointment for any further refills after this one. Please asked her to set up a physical or checkup for sometime this month.

## 2015-01-10 NOTE — Telephone Encounter (Signed)
Last sen 08/12/14  WLW

## 2015-01-11 ENCOUNTER — Other Ambulatory Visit: Payer: Self-pay | Admitting: *Deleted

## 2015-01-12 DIAGNOSIS — G894 Chronic pain syndrome: Secondary | ICD-10-CM | POA: Diagnosis not present

## 2015-01-12 DIAGNOSIS — M545 Low back pain: Secondary | ICD-10-CM | POA: Diagnosis not present

## 2015-01-12 DIAGNOSIS — M542 Cervicalgia: Secondary | ICD-10-CM | POA: Diagnosis not present

## 2015-01-16 ENCOUNTER — Other Ambulatory Visit: Payer: Self-pay | Admitting: Family

## 2015-01-16 ENCOUNTER — Ambulatory Visit (INDEPENDENT_AMBULATORY_CARE_PROVIDER_SITE_OTHER): Payer: Medicare Other | Admitting: Family

## 2015-01-16 ENCOUNTER — Encounter: Payer: Self-pay | Admitting: Family

## 2015-01-16 VITALS — BP 121/66 | HR 59 | Temp 97.6°F | Ht 62.0 in | Wt 150.6 lb

## 2015-01-16 DIAGNOSIS — I10 Essential (primary) hypertension: Secondary | ICD-10-CM | POA: Diagnosis not present

## 2015-01-16 DIAGNOSIS — K5900 Constipation, unspecified: Secondary | ICD-10-CM | POA: Diagnosis not present

## 2015-01-16 DIAGNOSIS — E559 Vitamin D deficiency, unspecified: Secondary | ICD-10-CM

## 2015-01-16 DIAGNOSIS — G4700 Insomnia, unspecified: Secondary | ICD-10-CM

## 2015-01-16 DIAGNOSIS — B373 Candidiasis of vulva and vagina: Secondary | ICD-10-CM

## 2015-01-16 DIAGNOSIS — E785 Hyperlipidemia, unspecified: Secondary | ICD-10-CM | POA: Diagnosis not present

## 2015-01-16 DIAGNOSIS — K649 Unspecified hemorrhoids: Secondary | ICD-10-CM

## 2015-01-16 DIAGNOSIS — F411 Generalized anxiety disorder: Secondary | ICD-10-CM

## 2015-01-16 DIAGNOSIS — J302 Other seasonal allergic rhinitis: Secondary | ICD-10-CM | POA: Diagnosis not present

## 2015-01-16 DIAGNOSIS — K219 Gastro-esophageal reflux disease without esophagitis: Secondary | ICD-10-CM

## 2015-01-16 DIAGNOSIS — M7989 Other specified soft tissue disorders: Secondary | ICD-10-CM | POA: Diagnosis not present

## 2015-01-16 DIAGNOSIS — B3731 Acute candidiasis of vulva and vagina: Secondary | ICD-10-CM

## 2015-01-16 MED ORDER — DILTIAZEM HCL ER COATED BEADS 120 MG PO CP24: 120.0000 mg | ORAL_CAPSULE | Freq: Every day | ORAL | Status: DC

## 2015-01-16 MED ORDER — FLUCONAZOLE 150 MG PO TABS: 150.0000 mg | ORAL_TABLET | Freq: Once | ORAL | Status: DC

## 2015-01-16 MED ORDER — SIMVASTATIN 20 MG PO TABS: 20.0000 mg | ORAL_TABLET | Freq: Every day | ORAL | Status: DC

## 2015-01-16 NOTE — Progress Notes (Signed)
Subjective:    Patient ID: Kaitlyn Serrano, female    DOB: [birth year], 74 y.o.   MRN: [id number]  Pt presents to the office today for chronic follow up. Pt is complaining of right lower leg swelling and pain. Pt states she hit her leg with "stick" about a week ago. Pt states her leg is tender and is complaining of a constant pain a 9 out 10. Pt is scared that it is a blood clot and states she has taken aspirin the last two days. Pt states her leg starts hurting her and she has to "lay down" for a few hours.  Hypertension This is a chronic problem. The current episode started more than 1 year ago. The problem has been resolved since onset. The problem is controlled. Associated symptoms include anxiety. Pertinent negatives include no chest pain, headaches, malaise/fatigue, palpitations, peripheral edema or shortness of breath. Risk factors for coronary artery disease include dyslipidemia, obesity and post-menopausal state. Past treatments include beta blockers and calcium channel blockers. The current treatment provides significant improvement. There is no history of kidney disease, CAD/MI, CVA, heart failure or a thyroid problem. There is no history of sleep apnea.  Hyperlipidemia This is a chronic problem. The current episode started more than 1 year ago. The problem is controlled. Recent lipid tests were reviewed and are normal. Associated symptoms include myalgias. Pertinent negatives include no chest pain, leg pain or shortness of breath. Current antihyperlipidemic treatment includes statins. The current treatment provides moderate improvement of lipids. Risk factors for coronary artery disease include dyslipidemia, family history, hypertension, obesity and post-menopausal.  Gastroesophageal Reflux She reports no chest pain, no coughing, no heartburn or no sore throat. This is a chronic problem. The current episode started more than 1 year ago. The problem occurs rarely. The problem has been  resolved. Pertinent negatives include no fatigue. Risk factors include obesity. She has tried a PPI for the symptoms. The treatment provided significant relief.  Anxiety Presents for follow-up visit. Onset was 1 to 5 years ago. The problem has been waxing and waning. Symptoms include excessive worry and nervous/anxious behavior. Patient reports no chest pain, insomnia, irritability, palpitations or shortness of breath. Symptoms occur occasionally. The severity of symptoms is moderate. The quality of sleep is good. Nighttime awakenings: none.   Her past medical history is significant for anxiety/panic attacks. Past treatments include benzodiazephines.  Constipation This is a chronic problem. The current episode started more than 1 year ago. The problem has been resolved since onset. Her stool frequency is 2 to 3 times per week. Pertinent negatives include no bloating. Risk factors include immobility. She has tried laxatives for the symptoms. The treatment provided moderate relief.  Insomnia Primary symptoms: difficulty falling asleep, frequent awakening, no malaise/fatigue.  The current episode started more than one year. The onset quality is gradual. The problem occurs intermittently. The symptoms are aggravated by anxiety. How many beverages per day that contain caffeine: 2-3.  Past treatments include medication. The treatment provided significant relief. Typical bedtime:  8-10 P.M..   Vaginal Itching The patient's primary symptoms include genital itching, a genital odor and vaginal discharge. This is a new problem. The current episode started in the past 7 days. The problem occurs constantly. The problem has been waxing and waning. Associated symptoms include constipation. Pertinent negatives include no headaches or sore throat. The vaginal discharge was white. She has tried nothing for the symptoms. The treatment provided no relief.      Review of Systems  Constitutional: Negative.  Negative for  malaise/fatigue, irritability and fatigue.  HENT: Negative.  Negative for sore throat.   Eyes: Negative.   Respiratory: Negative.  Negative for cough and shortness of breath.   Cardiovascular: Negative.  Negative for chest pain and palpitations.  Gastrointestinal: Positive for constipation. Negative for heartburn and bloating.  Endocrine: Negative.   Genitourinary: Positive for vaginal discharge.  Musculoskeletal: Positive for myalgias.  Neurological: Negative.  Negative for headaches.  Hematological: Negative.   Psychiatric/Behavioral: The patient is nervous/anxious. The patient does not have insomnia.   All other systems reviewed and are negative.      Objective:   Physical Exam  Constitutional: She is oriented to person, place, and time. She appears well-developed and well-nourished. No distress.  HENT:  Head: Normocephalic and atraumatic.  Right Ear: External ear normal.  Left Ear: External ear normal.  Nose: Nose normal.  Mouth/Throat: Oropharynx is clear and moist.  Eyes: Pupils are equal, round, and reactive to light.  Neck: Normal range of motion. Neck supple. No thyromegaly present.  Cardiovascular: Normal rate, regular rhythm, normal heart sounds and intact distal pulses.   No murmur heard. Pulmonary/Chest: Effort normal and breath sounds normal. No respiratory distress. She has no wheezes.  Abdominal: Soft. Bowel sounds are normal. She exhibits no distension. There is no tenderness.  Musculoskeletal: Normal range of motion. She exhibits tenderness (right lower ankle tenderness- healing ecchymosis  present ). She exhibits no edema.  Neurological: She is alert and oriented to person, place, and time. She has normal reflexes. No cranial nerve deficit.  Skin: Skin is warm and dry.  Psychiatric: She has a normal mood and affect. Her behavior is normal. Judgment and thought content normal.  Vitals reviewed.     BP 121/66 mmHg  Pulse 59  Temp(Src) 97.6 F (36.4 C)  (Oral)  Ht _0  (1.575 m)  Wt 150 lb 9.6 oz (68.312 kg)  BMI 27.54 kg/m2     Assessment & Plan:  1. Essential hypertension - CMP14+EGFR - diltiazem (CARDIZEM CD) 120 MG 24 hr capsule; Take 1 capsule (120 mg total) by mouth daily.  Dispense: 90 capsule; Refill: 1  2. Vitamin D deficiency - CMP14+EGFR - VITAMIN D 25 Hydroxy (Vit-D Deficiency, Fractures)  3. Hyperlipidemia - CMP14+EGFR - Lipid panel - simvastatin (ZOCOR) 20 MG tablet; Take 1 tablet (20 mg total) by mouth at bedtime.  Dispense: 90 tablet; Refill: 4  4. GAD (generalized anxiety disorder) - CMP14+EGFR  5. Gastroesophageal reflux disease without esophagitis - CMP14+EGFR  6. Seasonal allergic rhinitis - CMP14+EGFR  7. Hemorrhoids, unspecified hemorrhoid type  - CMP14+EGFR  8. Constipation, unspecified constipation type - CMP14+EGFR  9. Insomnia - CMP14+EGFR  10. Vagina, candidiasis - CMP14+EGFR - fluconazole (DIFLUCAN) 150 MG tablet; Take 1 tablet (150 mg total) by mouth once.  Dispense: 1 tablet; Refill: 0  11. Leg swelling -Pt told to keep leg elevated -Pt to have doppler to rule out blood clot- I think this is a hematoma, but will rule out blood clot - Ultrasound doppler venous legs bilat; Future   Continue all meds Labs pending Health Maintenance reviewed Diet and exercise encouraged RTO 3 months   Evelina Dun, FNP

## 2015-01-16 NOTE — Patient Instructions (Signed)
Health Maintenance, Female Adopting a healthy lifestyle and getting preventive care can go a long way to promote health and wellness. Talk with your health care provider about what schedule of regular examinations is right for you. This is a good chance for you to check in with your provider about disease prevention and staying healthy. In between checkups, there are plenty of things you can do on your own. Experts have done a lot of research about which lifestyle changes and preventive measures are most likely to keep you healthy. Ask your health care provider for more information. WEIGHT AND DIET  Eat a healthy diet  Be sure to include plenty of vegetables, fruits, low-fat dairy products, and lean protein.  Do not eat a lot of foods high in solid fats, added sugars, or salt.  Get regular exercise. This is one of the most important things you can do for your health.  Most adults should exercise for at least 150 minutes each week. The exercise should increase your heart rate and make you sweat (moderate-intensity exercise).  Most adults should also do strengthening exercises at least twice a week. This is in addition to the moderate-intensity exercise.  Maintain a healthy weight  Body mass index (BMI) is a measurement that can be used to identify possible weight problems. It estimates body fat based on height and weight. Your health care provider can help determine your BMI and help you achieve or maintain a healthy weight.  For females 20 years of age and older:   A BMI below 18.5 is considered underweight.  A BMI of 18.5 to 24.9 is normal.  A BMI of 25 to 29.9 is considered overweight.  A BMI of 30 and above is considered obese.  Watch levels of cholesterol and blood lipids  You should start having your blood tested for lipids and cholesterol at 74 years of age, then have this test every 5 years.  You may need to have your cholesterol levels checked more often if:  Your lipid  or cholesterol levels are high.  You are older than 74 years of age.  You are at high risk for heart disease.  CANCER SCREENING   Lung Cancer  Lung cancer screening is recommended for adults 55-80 years old who are at high risk for lung cancer because of a history of smoking.  A yearly low-dose CT scan of the lungs is recommended for people who:  Currently smoke.  Have quit within the past 15 years.  Have at least a 30-pack-year history of smoking. A pack year is smoking an average of one pack of cigarettes a day for 1 year.  Yearly screening should continue until it has been 15 years since you quit.  Yearly screening should stop if you develop a health problem that would prevent you from having lung cancer treatment.  Breast Cancer  Practice breast self-awareness. This means understanding how your breasts normally appear and feel.  It also means doing regular breast self-exams. Let your health care provider know about any changes, no matter how small.  If you are in your 20s or 30s, you should have a clinical breast exam (CBE) by a health care provider every 1-3 years as part of a regular health exam.  If you are 40 or older, have a CBE every year. Also consider having a breast X-ray (mammogram) every year.  If you have a family history of breast cancer, talk to your health care provider about genetic screening.  If you   are at high risk for breast cancer, talk to your health care provider about having an MRI and a mammogram every year.  Breast cancer gene (BRCA) assessment is recommended for women who have family members with BRCA-related cancers. BRCA-related cancers include:  Breast.  Ovarian.  Tubal.  Peritoneal cancers.  Results of the assessment will determine the need for genetic counseling and BRCA1 and BRCA2 testing. Cervical Cancer Your health care provider may recommend that you be screened regularly for cancer of the pelvic organs (ovaries, uterus, and  vagina). This screening involves a pelvic examination, including checking for microscopic changes to the surface of your cervix (Pap test). You may be encouraged to have this screening done every 3 years, beginning at age 21.  For women ages 30-65, health care providers may recommend pelvic exams and Pap testing every 3 years, or they may recommend the Pap and pelvic exam, combined with testing for human papilloma virus (HPV), every 5 years. Some types of HPV increase your risk of cervical cancer. Testing for HPV may also be done on women of any age with unclear Pap test results.  Other health care providers may not recommend any screening for nonpregnant women who are considered low risk for pelvic cancer and who do not have symptoms. Ask your health care provider if a screening pelvic exam is right for you.  If you have had past treatment for cervical cancer or a condition that could lead to cancer, you need Pap tests and screening for cancer for at least 20 years after your treatment. If Pap tests have been discontinued, your risk factors (such as having a new sexual partner) need to be reassessed to determine if screening should resume. Some women have medical problems that increase the chance of getting cervical cancer. In these cases, your health care provider may recommend more frequent screening and Pap tests. Colorectal Cancer  This type of cancer can be detected and often prevented.  Routine colorectal cancer screening usually begins at 74 years of age and continues through 75 years of age.  Your health care provider may recommend screening at an earlier age if you have risk factors for colon cancer.  Your health care provider may also recommend using home test kits to check for hidden blood in the stool.  A small camera at the end of a tube can be used to examine your colon directly (sigmoidoscopy or colonoscopy). This is done to check for the earliest forms of colorectal  cancer.  Routine screening usually begins at age 50.  Direct examination of the colon should be repeated every 5-10 years through 75 years of age. However, you may need to be screened more often if early forms of precancerous polyps or small growths are found. Skin Cancer  Check your skin from head to toe regularly.  Tell your health care provider about any new moles or changes in moles, especially if there is a change in a mole's shape or color.  Also tell your health care provider if you have a mole that is larger than the size of a pencil eraser.  Always use sunscreen. Apply sunscreen liberally and repeatedly throughout the day.  Protect yourself by wearing long sleeves, pants, a wide-brimmed hat, and sunglasses whenever you are outside. HEART DISEASE, DIABETES, AND HIGH BLOOD PRESSURE   High blood pressure causes heart disease and increases the risk of stroke. High blood pressure is more likely to develop in:  People who have blood pressure in the high end   of the normal range (130-139/85-89 mm Hg).  People who are overweight or obese.  People who are African American.  If you are 38-23 years of age, have your blood pressure checked every 3-5 years. If you are 61 years of age or older, have your blood pressure checked every year. You should have your blood pressure measured twice--once when you are at a hospital or clinic, and once when you are not at a hospital or clinic. Record the average of the two measurements. To check your blood pressure when you are not at a hospital or clinic, you can use:  An automated blood pressure machine at a pharmacy.  A home blood pressure monitor.  If you are between 45 years and 39 years old, ask your health care provider if you should take aspirin to prevent strokes.  Have regular diabetes screenings. This involves taking a blood sample to check your fasting blood sugar level.  If you are at a normal weight and have a low risk for diabetes,  have this test once every three years after 74 years of age.  If you are overweight and have a high risk for diabetes, consider being tested at a younger age or more often. PREVENTING INFECTION  Hepatitis B  If you have a higher risk for hepatitis B, you should be screened for this virus. You are considered at high risk for hepatitis B if:  You were born in a country where hepatitis B is common. Ask your health care provider which countries are considered high risk.  Your parents were born in a high-risk country, and you have not been immunized against hepatitis B (hepatitis B vaccine).  You have HIV or AIDS.  You use needles to inject street drugs.  You live with someone who has hepatitis B.  You have had sex with someone who has hepatitis B.  You get hemodialysis treatment.  You take certain medicines for conditions, including cancer, organ transplantation, and autoimmune conditions. Hepatitis C  Blood testing is recommended for:  Everyone born from [birth year] through 1965.  Anyone with known risk factors for hepatitis C. Sexually transmitted infections (STIs)  You should be screened for sexually transmitted infections (STIs) including gonorrhea and chlamydia if:  You are sexually active and are younger than 74 years of age.  You are older than 74 years of age and your health care provider tells you that you are at risk for this type of infection.  Your sexual activity has changed since you were last screened and you are at an increased risk for chlamydia or gonorrhea. Ask your health care provider if you are at risk.  If you do not have HIV, but are at risk, it may be recommended that you take a prescription medicine daily to prevent HIV infection. This is called pre-exposure prophylaxis (PrEP). You are considered at risk if:  You are sexually active and do not regularly use condoms or know the HIV status of your partner(s).  You take drugs by injection.  You are sexually  active with a partner who has HIV. Talk with your health care provider about whether you are at high risk of being infected with HIV. If you choose to begin PrEP, you should first be tested for HIV. You should then be tested every 3 months for as long as you are taking PrEP.  PREGNANCY   If you are premenopausal and you may become pregnant, ask your health care provider about preconception counseling.  If you may  become pregnant, take 400 to 800 micrograms (mcg) of folic acid every day.  If you want to prevent pregnancy, talk to your health care provider about birth control (contraception). OSTEOPOROSIS AND MENOPAUSE   Osteoporosis is a disease in which the bones lose minerals and strength with aging. This can result in serious bone fractures. Your risk for osteoporosis can be identified using a bone density scan.  If you are 61 years of age or older, or if you are at risk for osteoporosis and fractures, ask your health care provider if you should be screened.  Ask your health care provider whether you should take a calcium or vitamin D supplement to lower your risk for osteoporosis.  Menopause may have certain physical symptoms and risks.  Hormone replacement therapy may reduce some of these symptoms and risks. Talk to your health care provider about whether hormone replacement therapy is right for you.  HOME CARE INSTRUCTIONS   Schedule regular health, dental, and eye exams.  Stay current with your immunizations.   Do not use any tobacco products including cigarettes, chewing tobacco, or electronic cigarettes.  If you are pregnant, do not drink alcohol.  If you are breastfeeding, limit how much and how often you drink alcohol.  Limit alcohol intake to no more than 1 drink per day for nonpregnant women. One drink equals 12 ounces of beer, 5 ounces of wine, or 1 ounces of hard liquor.  Do not use street drugs.  Do not share needles.  Ask your health care provider for help if  you need support or information about quitting drugs.  Tell your health care provider if you often feel depressed.  Tell your health care provider if you have ever been abused or do not feel safe at home.   This information is not intended to replace advice given to you by your health care provider. Make sure you discuss any questions you have with your health care provider.   Document Released: 08/27/2010 Document Revised: 03/04/2014 Document Reviewed: 01/13/2013 Elsevier Interactive Patient Education [employer].

## 2015-01-17 LAB — VITAMIN D 25 HYDROXY (VIT D DEFICIENCY, FRACTURES): Vit D, 25-Hydroxy: 58.6 ng/mL (ref 30.0–100.0)

## 2015-01-17 LAB — CMP14+EGFR
ALT: 20 IU/L (ref 0–32)
AST: 23 IU/L (ref 0–40)
Albumin/Globulin Ratio: 1.6 (ref 1.1–2.5)
Albumin: 3.9 g/dL (ref 3.5–4.8)
Alkaline Phosphatase: 80 IU/L (ref 39–117)
BUN/Creatinine Ratio: 17 (ref 11–26)
BUN: 12 mg/dL (ref 8–27)
Bilirubin Total: 0.8 mg/dL (ref 0.0–1.2)
CALCIUM: 9.2 mg/dL (ref 8.7–10.3)
CO2: 28 mmol/L (ref 18–29)
CREATININE: 0.69 mg/dL (ref 0.57–1.00)
Chloride: 101 mmol/L (ref 97–106)
GFR, EST AFRICAN AMERICAN: 99 mL/min/{1.73_m2} (ref >59)
GFR, EST NON AFRICAN AMERICAN: 86 mL/min/{1.73_m2} (ref >59)
GLOBULIN, TOTAL: 2.5 g/dL (ref 1.5–4.5)
Glucose: 123 mg/dL — ABNORMAL HIGH (ref 65–99)
Potassium: 3.4 mmol/L — ABNORMAL LOW (ref 3.5–5.2)
Sodium: 144 mmol/L (ref 136–144)
TOTAL PROTEIN: 6.4 g/dL (ref 6.0–8.5)

## 2015-01-17 LAB — LIPID PANEL
CHOL/HDL RATIO: 2.5 ratio (ref 0.0–4.4)
Cholesterol, Total: 134 mg/dL (ref 100–199)
HDL: 53 mg/dL (ref >39)
LDL CALC: 55 mg/dL (ref 0–99)
TRIGLYCERIDES: 129 mg/dL (ref 0–149)
VLDL CHOLESTEROL CAL: 26 mg/dL (ref 5–40)

## 2015-01-18 DIAGNOSIS — M545 Low back pain: Secondary | ICD-10-CM | POA: Diagnosis not present

## 2015-01-18 DIAGNOSIS — M961 Postlaminectomy syndrome, not elsewhere classified: Secondary | ICD-10-CM | POA: Diagnosis not present

## 2015-01-18 DIAGNOSIS — M5416 Radiculopathy, lumbar region: Secondary | ICD-10-CM | POA: Diagnosis not present

## 2015-01-18 DIAGNOSIS — G894 Chronic pain syndrome: Secondary | ICD-10-CM | POA: Diagnosis not present

## 2015-01-23 DIAGNOSIS — H40033 Anatomical narrow angle, bilateral: Secondary | ICD-10-CM | POA: Diagnosis not present

## 2015-01-23 DIAGNOSIS — H04123 Dry eye syndrome of bilateral lacrimal glands: Secondary | ICD-10-CM | POA: Diagnosis not present

## 2015-01-27 ENCOUNTER — Other Ambulatory Visit: Payer: Self-pay | Admitting: Orthopaedic Surgery

## 2015-01-27 DIAGNOSIS — M961 Postlaminectomy syndrome, not elsewhere classified: Secondary | ICD-10-CM

## 2015-01-29 DIAGNOSIS — M549 Dorsalgia, unspecified: Secondary | ICD-10-CM | POA: Diagnosis not present

## 2015-01-29 DIAGNOSIS — M79605 Pain in left leg: Secondary | ICD-10-CM | POA: Diagnosis not present

## 2015-01-29 DIAGNOSIS — E876 Hypokalemia: Secondary | ICD-10-CM | POA: Diagnosis not present

## 2015-01-29 DIAGNOSIS — M898X9 Other specified disorders of bone, unspecified site: Secondary | ICD-10-CM | POA: Diagnosis not present

## 2015-01-29 DIAGNOSIS — M5442 Lumbago with sciatica, left side: Secondary | ICD-10-CM | POA: Diagnosis not present

## 2015-02-14 ENCOUNTER — Ambulatory Visit
Admission: RE | Admit: 2015-02-14 | Discharge: 2015-02-14 | Disposition: A | Discharge status: Home / Self Care | Payer: Medicare Other | Source: Ambulatory Visit | Attending: Orthopaedic Surgery | Admitting: Orthopaedic Surgery

## 2015-02-14 ENCOUNTER — Telehealth: Payer: Self-pay | Admitting: Family

## 2015-02-14 DIAGNOSIS — M961 Postlaminectomy syndrome, not elsewhere classified: Secondary | ICD-10-CM

## 2015-02-14 DIAGNOSIS — M4326 Fusion of spine, lumbar region: Secondary | ICD-10-CM | POA: Diagnosis not present

## 2015-02-14 MED ORDER — DIAZEPAM 5 MG PO TABS
5.0000 mg | ORAL_TABLET | Freq: Once | ORAL | Status: AC
  Administered 2015-02-14: 5 mg via ORAL

## 2015-02-14 MED ORDER — IOHEXOL 180 MG/ML  SOLN
15.0000 mL | Freq: Once | INTRAMUSCULAR | Status: AC | PRN
  Administered 2015-02-14: 15 mL via INTRATHECAL

## 2015-02-14 NOTE — Discharge Instructions (Signed)
Myelogram Discharge Instructions  1. Go home and rest quietly for the next 24 hours.  It is important to lie flat for the next 24 hours.  Get up only to go to the restroom.  You may lie in the bed or on a couch on your back, your stomach, your left side or your right side.  You may have one pillow under your head.  You may have pillows between your knees while you are on your side or under your knees while you are on your back.  2. DO NOT drive today.  Recline the seat as far back as it will go, while still wearing your seat belt, on the way home.  3. You may get up to go to the bathroom as needed.  You may sit up for 10 minutes to eat.  You may resume your normal diet and medications unless otherwise indicated.  Drink lots of extra fluids today and tomorrow.  4. The incidence of headache, nausea, or vomiting is about 5% (one in 20 patients).  If you develop a headache, lie flat and drink plenty of fluids until the headache goes away.  Caffeinated beverages may be helpful.  If you develop severe nausea and vomiting or a headache that does not go away with flat bed rest, call [phone].  5. You may resume normal activities after your 24 hours of bed rest is over; however, do not exert yourself strongly or do any heavy lifting tomorrow. If when you get up you have a headache when standing, go back to bed and force fluids for another 24 hours.  6. Call your physician for a follow-up appointment.  The results of your myelogram will be sent directly to your physician by the following day.  7. If you have any questions or if complications develop after you arrive home, please call [phone].  Discharge instructions have been explained to the patient.  The patient, or the person responsible for the patient, fully understands these instructions.       May resume Trazodone on Dec. 21, 2016, after 11:00 am

## 2015-02-14 NOTE — Progress Notes (Signed)
Patient states she has been off Trazodone for the past four days.

## 2015-02-15 ENCOUNTER — Other Ambulatory Visit: Payer: Self-pay | Admitting: Family

## 2015-02-15 ENCOUNTER — Other Ambulatory Visit: Payer: Self-pay | Admitting: Family Medicine

## 2015-02-23 DIAGNOSIS — M961 Postlaminectomy syndrome, not elsewhere classified: Secondary | ICD-10-CM | POA: Diagnosis not present

## 2015-02-23 DIAGNOSIS — M545 Low back pain: Secondary | ICD-10-CM | POA: Diagnosis not present

## 2015-02-23 DIAGNOSIS — G894 Chronic pain syndrome: Secondary | ICD-10-CM | POA: Diagnosis not present

## 2015-02-23 DIAGNOSIS — M5416 Radiculopathy, lumbar region: Secondary | ICD-10-CM | POA: Diagnosis not present

## 2015-03-23 DIAGNOSIS — M545 Low back pain: Secondary | ICD-10-CM | POA: Diagnosis not present

## 2015-03-23 DIAGNOSIS — M961 Postlaminectomy syndrome, not elsewhere classified: Secondary | ICD-10-CM | POA: Diagnosis not present

## 2015-03-23 DIAGNOSIS — G894 Chronic pain syndrome: Secondary | ICD-10-CM | POA: Diagnosis not present

## 2015-03-30 ENCOUNTER — Other Ambulatory Visit: Payer: Self-pay | Admitting: Family Medicine

## 2015-04-20 ENCOUNTER — Ambulatory Visit (INDEPENDENT_AMBULATORY_CARE_PROVIDER_SITE_OTHER): Payer: Medicare Other | Admitting: Family

## 2015-04-20 ENCOUNTER — Encounter: Payer: Self-pay | Admitting: Family

## 2015-04-20 VITALS — BP 138/72 | HR 60 | Temp 97.2°F | Ht 62.0 in | Wt 156.0 lb

## 2015-04-20 DIAGNOSIS — K5900 Constipation, unspecified: Secondary | ICD-10-CM

## 2015-04-20 DIAGNOSIS — J302 Other seasonal allergic rhinitis: Secondary | ICD-10-CM

## 2015-04-20 DIAGNOSIS — E785 Hyperlipidemia, unspecified: Secondary | ICD-10-CM | POA: Diagnosis not present

## 2015-04-20 DIAGNOSIS — R739 Hyperglycemia, unspecified: Secondary | ICD-10-CM | POA: Diagnosis not present

## 2015-04-20 DIAGNOSIS — Z780 Asymptomatic menopausal state: Secondary | ICD-10-CM | POA: Diagnosis not present

## 2015-04-20 DIAGNOSIS — G4700 Insomnia, unspecified: Secondary | ICD-10-CM

## 2015-04-20 DIAGNOSIS — E559 Vitamin D deficiency, unspecified: Secondary | ICD-10-CM

## 2015-04-20 DIAGNOSIS — Z8639 Personal history of other endocrine, nutritional and metabolic disease: Secondary | ICD-10-CM

## 2015-04-20 DIAGNOSIS — K219 Gastro-esophageal reflux disease without esophagitis: Secondary | ICD-10-CM | POA: Diagnosis not present

## 2015-04-20 DIAGNOSIS — I10 Essential (primary) hypertension: Secondary | ICD-10-CM | POA: Diagnosis not present

## 2015-04-20 DIAGNOSIS — F411 Generalized anxiety disorder: Secondary | ICD-10-CM | POA: Diagnosis not present

## 2015-04-20 LAB — POCT GLYCOSYLATED HEMOGLOBIN (HGB A1C): HEMOGLOBIN A1C: 5.5

## 2015-04-20 NOTE — Patient Instructions (Signed)
Health Maintenance, Female Adopting a healthy lifestyle and getting preventive care can go a long way to promote health and wellness. Talk with your health care provider about what schedule of regular examinations is right for you. This is a good chance for you to check in with your provider about disease prevention and staying healthy. In between checkups, there are plenty of things you can do on your own. Experts have done a lot of research about which lifestyle changes and preventive measures are most likely to keep you healthy. Ask your health care provider for more information. WEIGHT AND DIET  Eat a healthy diet  Be sure to include plenty of vegetables, fruits, low-fat dairy products, and lean protein.  Do not eat a lot of foods high in solid fats, added sugars, or salt.  Get regular exercise. This is one of the most important things you can do for your health.  Most adults should exercise for at least 150 minutes each week. The exercise should increase your heart rate and make you sweat (moderate-intensity exercise).  Most adults should also do strengthening exercises at least twice a week. This is in addition to the moderate-intensity exercise.  Maintain a healthy weight  Body mass index (BMI) is a measurement that can be used to identify possible weight problems. It estimates body fat based on height and weight. Your health care provider can help determine your BMI and help you achieve or maintain a healthy weight.  For females 20 years of age and older:   A BMI below 18.5 is considered underweight.  A BMI of 18.5 to 24.9 is normal.  A BMI of 25 to 29.9 is considered overweight.  A BMI of 30 and above is considered obese.  Watch levels of cholesterol and blood lipids  You should start having your blood tested for lipids and cholesterol at 75 years of age, then have this test every 5 years.  You may need to have your cholesterol levels checked more often if:  Your lipid  or cholesterol levels are high.  You are older than 75 years of age.  You are at high risk for heart disease.  CANCER SCREENING   Lung Cancer  Lung cancer screening is recommended for adults 55-80 years old who are at high risk for lung cancer because of a history of smoking.  A yearly low-dose CT scan of the lungs is recommended for people who:  Currently smoke.  Have quit within the past 15 years.  Have at least a 30-pack-year history of smoking. A pack year is smoking an average of one pack of cigarettes a day for 1 year.  Yearly screening should continue until it has been 15 years since you quit.  Yearly screening should stop if you develop a health problem that would prevent you from having lung cancer treatment.  Breast Cancer  Practice breast self-awareness. This means understanding how your breasts normally appear and feel.  It also means doing regular breast self-exams. Let your health care provider know about any changes, no matter how small.  If you are in your 20s or 30s, you should have a clinical breast exam (CBE) by a health care provider every 1-3 years as part of a regular health exam.  If you are 40 or older, have a CBE every year. Also consider having a breast X-ray (mammogram) every year.  If you have a family history of breast cancer, talk to your health care provider about genetic screening.  If you   are at high risk for breast cancer, talk to your health care provider about having an MRI and a mammogram every year.  Breast cancer gene (BRCA) assessment is recommended for women who have family members with BRCA-related cancers. BRCA-related cancers include:  Breast.  Ovarian.  Tubal.  Peritoneal cancers.  Results of the assessment will determine the need for genetic counseling and BRCA1 and BRCA2 testing. Cervical Cancer Your health care provider may recommend that you be screened regularly for cancer of the pelvic organs (ovaries, uterus, and  vagina). This screening involves a pelvic examination, including checking for microscopic changes to the surface of your cervix (Pap test). You may be encouraged to have this screening done every 3 years, beginning at age 21.  For women ages 30-65, health care providers may recommend pelvic exams and Pap testing every 3 years, or they may recommend the Pap and pelvic exam, combined with testing for human papilloma virus (HPV), every 5 years. Some types of HPV increase your risk of cervical cancer. Testing for HPV may also be done on women of any age with unclear Pap test results.  Other health care providers may not recommend any screening for nonpregnant women who are considered low risk for pelvic cancer and who do not have symptoms. Ask your health care provider if a screening pelvic exam is right for you.  If you have had past treatment for cervical cancer or a condition that could lead to cancer, you need Pap tests and screening for cancer for at least 20 years after your treatment. If Pap tests have been discontinued, your risk factors (such as having a new sexual partner) need to be reassessed to determine if screening should resume. Some women have medical problems that increase the chance of getting cervical cancer. In these cases, your health care provider may recommend more frequent screening and Pap tests. Colorectal Cancer  This type of cancer can be detected and often prevented.  Routine colorectal cancer screening usually begins at 75 years of age and continues through 75 years of age.  Your health care provider may recommend screening at an earlier age if you have risk factors for colon cancer.  Your health care provider may also recommend using home test kits to check for hidden blood in the stool.  A small camera at the end of a tube can be used to examine your colon directly (sigmoidoscopy or colonoscopy). This is done to check for the earliest forms of colorectal  cancer.  Routine screening usually begins at age 50.  Direct examination of the colon should be repeated every 5-10 years through 75 years of age. However, you may need to be screened more often if early forms of precancerous polyps or small growths are found. Skin Cancer  Check your skin from head to toe regularly.  Tell your health care provider about any new moles or changes in moles, especially if there is a change in a mole's shape or color.  Also tell your health care provider if you have a mole that is larger than the size of a pencil eraser.  Always use sunscreen. Apply sunscreen liberally and repeatedly throughout the day.  Protect yourself by wearing long sleeves, pants, a wide-brimmed hat, and sunglasses whenever you are outside. HEART DISEASE, DIABETES, AND HIGH BLOOD PRESSURE   High blood pressure causes heart disease and increases the risk of stroke. High blood pressure is more likely to develop in:  People who have blood pressure in the high end   of the normal range (130-139/85-89 mm Hg).  People who are overweight or obese.  People who are African American.  If you are 87-43 years of age, have your blood pressure checked every 3-5 years. If you are 23 years of age or older, have your blood pressure checked every year. You should have your blood pressure measured twice--once when you are at a hospital or clinic, and once when you are not at a hospital or clinic. Record the average of the two measurements. To check your blood pressure when you are not at a hospital or clinic, you can use:  An automated blood pressure machine at a pharmacy.  A home blood pressure monitor.  If you are between 70 years and 64 years old, ask your health care provider if you should take aspirin to prevent strokes.  Have regular diabetes screenings. This involves taking a blood sample to check your fasting blood sugar level.  If you are at a normal weight and have a low risk for diabetes,  have this test once every three years after 75 years of age.  If you are overweight and have a high risk for diabetes, consider being tested at a younger age or more often. PREVENTING INFECTION  Hepatitis B  If you have a higher risk for hepatitis B, you should be screened for this virus. You are considered at high risk for hepatitis B if:  You were born in a country where hepatitis B is common. Ask your health care provider which countries are considered high risk.  Your parents were born in a high-risk country, and you have not been immunized against hepatitis B (hepatitis B vaccine).  You have HIV or AIDS.  You use needles to inject street drugs.  You live with someone who has hepatitis B.  You have had sex with someone who has hepatitis B.  You get hemodialysis treatment.  You take certain medicines for conditions, including cancer, organ transplantation, and autoimmune conditions. Hepatitis C  Blood testing is recommended for:  Everyone born from [birth year] through 1965.  Anyone with known risk factors for hepatitis C. Sexually transmitted infections (STIs)  You should be screened for sexually transmitted infections (STIs) including gonorrhea and chlamydia if:  You are sexually active and are younger than 75 years of age.  You are older than 75 years of age and your health care provider tells you that you are at risk for this type of infection.  Your sexual activity has changed since you were last screened and you are at an increased risk for chlamydia or gonorrhea. Ask your health care provider if you are at risk.  If you do not have HIV, but are at risk, it may be recommended that you take a prescription medicine daily to prevent HIV infection. This is called pre-exposure prophylaxis (PrEP). You are considered at risk if:  You are sexually active and do not regularly use condoms or know the HIV status of your partner(s).  You take drugs by injection.  You are sexually  active with a partner who has HIV. Talk with your health care provider about whether you are at high risk of being infected with HIV. If you choose to begin PrEP, you should first be tested for HIV. You should then be tested every 3 months for as long as you are taking PrEP.  PREGNANCY   If you are premenopausal and you may become pregnant, ask your health care provider about preconception counseling.  If you may  become pregnant, take 400 to 800 micrograms (mcg) of folic acid every day.  If you want to prevent pregnancy, talk to your health care provider about birth control (contraception). OSTEOPOROSIS AND MENOPAUSE   Osteoporosis is a disease in which the bones lose minerals and strength with aging. This can result in serious bone fractures. Your risk for osteoporosis can be identified using a bone density scan.  If you are 61 years of age or older, or if you are at risk for osteoporosis and fractures, ask your health care provider if you should be screened.  Ask your health care provider whether you should take a calcium or vitamin D supplement to lower your risk for osteoporosis.  Menopause may have certain physical symptoms and risks.  Hormone replacement therapy may reduce some of these symptoms and risks. Talk to your health care provider about whether hormone replacement therapy is right for you.  HOME CARE INSTRUCTIONS   Schedule regular health, dental, and eye exams.  Stay current with your immunizations.   Do not use any tobacco products including cigarettes, chewing tobacco, or electronic cigarettes.  If you are pregnant, do not drink alcohol.  If you are breastfeeding, limit how much and how often you drink alcohol.  Limit alcohol intake to no more than 1 drink per day for nonpregnant women. One drink equals 12 ounces of beer, 5 ounces of wine, or 1 ounces of hard liquor.  Do not use street drugs.  Do not share needles.  Ask your health care provider for help if  you need support or information about quitting drugs.  Tell your health care provider if you often feel depressed.  Tell your health care provider if you have ever been abused or do not feel safe at home.   This information is not intended to replace advice given to you by your health care provider. Make sure you discuss any questions you have with your health care provider.   Document Released: 08/27/2010 Document Revised: 03/04/2014 Document Reviewed: 01/13/2013 Elsevier Interactive Patient Education [employer].

## 2015-04-20 NOTE — Progress Notes (Signed)
Subjective:    Patient ID: Kaitlyn Serrano, female    DOB: 11/29/1940, 75 y.o.   MRN: [id number]  Pt presents to the office today for chronic follow up.  Hypertension This is a chronic problem. The current episode started more than 1 year ago. The problem has been resolved since onset. The problem is controlled. Associated symptoms include anxiety. Pertinent negatives include no chest pain, malaise/fatigue, palpitations, peripheral edema or shortness of breath. Risk factors for coronary artery disease include dyslipidemia, obesity and post-menopausal state. Past treatments include beta blockers and calcium channel blockers. The current treatment provides significant improvement. There is no history of kidney disease, CAD/MI, CVA, heart failure or a thyroid problem. There is no history of sleep apnea.  Hyperlipidemia This is a chronic problem. The current episode started more than 1 year ago. The problem is controlled. Recent lipid tests were reviewed and are normal. Associated symptoms include myalgias. Pertinent negatives include no chest pain, leg pain or shortness of breath. Current antihyperlipidemic treatment includes statins. The current treatment provides moderate improvement of lipids. Risk factors for coronary artery disease include dyslipidemia, family history, hypertension, obesity and post-menopausal.  Gastroesophageal Reflux She reports no chest pain, no coughing or no heartburn. This is a chronic problem. The current episode started more than 1 year ago. The problem occurs rarely. The problem has been resolved. Pertinent negatives include no fatigue. Risk factors include obesity. She has tried a PPI for the symptoms. The treatment provided significant relief.  Anxiety Presents for follow-up visit. Onset was 1 to 5 years ago. The problem has been waxing and waning. Symptoms include excessive worry and nervous/anxious behavior. Patient reports no chest pain, insomnia, irritability,  palpitations or shortness of breath. Symptoms occur occasionally. The severity of symptoms is moderate. The quality of sleep is good. Nighttime awakenings: none.   Her past medical history is significant for anxiety/panic attacks. Past treatments include benzodiazephines.  Constipation This is a chronic problem. The current episode started more than 1 year ago. The problem has been resolved since onset. Her stool frequency is 2 to 3 times per week. Pertinent negatives include no bloating. Risk factors include immobility. She has tried laxatives for the symptoms. The treatment provided moderate relief.  Insomnia Primary symptoms: difficulty falling asleep, frequent awakening, no malaise/fatigue.  The current episode started more than one year. The onset quality is gradual. The problem occurs intermittently. The symptoms are aggravated by anxiety. How many beverages per day that contain caffeine: 2-3.  Past treatments include medication. The treatment provided significant relief. Typical bedtime:  8-10 P.M..       Review of Systems  Constitutional: Negative.  Negative for malaise/fatigue, irritability and fatigue.  HENT: Negative.   Eyes: Negative.   Respiratory: Negative.  Negative for cough and shortness of breath.   Cardiovascular: Negative.  Negative for chest pain and palpitations.  Gastrointestinal: Negative for heartburn and bloating.  Endocrine: Negative.   Musculoskeletal: Positive for myalgias.  Neurological: Negative.   Hematological: Negative.   Psychiatric/Behavioral: The patient is nervous/anxious. The patient does not have insomnia.   All other systems reviewed and are negative.      Objective:   Physical Exam  Constitutional: She is oriented to person, place, and time. She appears well-developed and well-nourished. No distress.  HENT:  Head: Normocephalic and atraumatic.  Right Ear: External ear normal.  Left Ear: External ear normal.  Nose: Nose normal.    Mouth/Throat: Oropharynx is clear and moist.  Eyes: Pupils are equal,  round, and reactive to light.  Neck: Normal range of motion. Neck supple. No thyromegaly present.  Cardiovascular: Normal rate, regular rhythm, normal heart sounds and intact distal pulses.   No murmur heard. Pulmonary/Chest: Effort normal and breath sounds normal. No respiratory distress. She has no wheezes.  Abdominal: Soft. Bowel sounds are normal. She exhibits no distension. There is no tenderness.  Musculoskeletal: Normal range of motion. She exhibits no edema or tenderness.  Neurological: She is alert and oriented to person, place, and time. She has normal reflexes. No cranial nerve deficit.  Skin: Skin is warm and dry.  Psychiatric: She has a normal mood and affect. Her behavior is normal. Judgment and thought content normal.  Vitals reviewed.     BP 138/72 mmHg  Pulse 60  Temp(Src) 97.2 F (36.2 C) (Oral)  Ht '5\' 2"'  (1.575 m)  Wt 156 lb (70.761 kg)  BMI 28.53 kg/m2     Assessment & Plan:  1. Essential hypertension - CMP14+EGFR  2. Gastroesophageal reflux disease without esophagitis - CMP14+EGFR  3. Hyperlipidemia - CMP14+EGFR - Lipid panel  4. Constipation, unspecified constipation type - CMP14+EGFR  5. Insomnia - CMP14+EGFR  6. Vitamin D deficiency - CMP14+EGFR - VITAMIN D 25 Hydroxy (Vit-D Deficiency, Fractures)  7. GAD (generalized anxiety disorder) - CMP14+EGFR  8. Seasonal allergic rhinitis  - CMP14+EGFR  9. Post-menopausal  10. History of diabetes mellitus, type II - POCT glycosylated hemoglobin (Hb A1C)  11. Hyperglycemia  - POCT glycosylated hemoglobin (Hb A1C)   Continue all meds Labs pending Health Maintenance reviewed Diet and exercise encouraged RTO 6 months  Evelina Dun, FNP

## 2015-04-21 LAB — CMP14+EGFR
A/G RATIO: 1.6 (ref 1.1–2.5)
ALBUMIN: 3.9 g/dL (ref 3.5–4.8)
ALT: 12 IU/L (ref 0–32)
AST: 20 IU/L (ref 0–40)
Alkaline Phosphatase: 102 IU/L (ref 39–117)
BILIRUBIN TOTAL: 0.7 mg/dL (ref 0.0–1.2)
BUN/Creatinine Ratio: 9 — ABNORMAL LOW (ref 11–26)
BUN: 6 mg/dL — ABNORMAL LOW (ref 8–27)
CALCIUM: 9.2 mg/dL (ref 8.7–10.3)
CHLORIDE: 99 mmol/L (ref 96–106)
CO2: 29 mmol/L (ref 18–29)
Creatinine, Ser: 0.64 mg/dL (ref 0.57–1.00)
GFR calc non Af Amer: 88 mL/min/{1.73_m2} (ref >59)
GFR, EST AFRICAN AMERICAN: 102 mL/min/{1.73_m2} (ref >59)
Globulin, Total: 2.4 g/dL (ref 1.5–4.5)
Glucose: 89 mg/dL (ref 65–99)
POTASSIUM: 3.9 mmol/L (ref 3.5–5.2)
SODIUM: 141 mmol/L (ref 134–144)
TOTAL PROTEIN: 6.3 g/dL (ref 6.0–8.5)

## 2015-04-21 LAB — LIPID PANEL
CHOL/HDL RATIO: 2.7 ratio (ref 0.0–4.4)
Cholesterol, Total: 143 mg/dL (ref 100–199)
HDL: 53 mg/dL (ref >39)
LDL Calculated: 67 mg/dL (ref 0–99)
Triglycerides: 117 mg/dL (ref 0–149)
VLDL Cholesterol Cal: 23 mg/dL (ref 5–40)

## 2015-04-21 LAB — VITAMIN D 25 HYDROXY (VIT D DEFICIENCY, FRACTURES): VIT D 25 HYDROXY: 42.5 ng/mL (ref 30.0–100.0)

## 2015-04-25 ENCOUNTER — Telehealth: Payer: Self-pay

## 2015-04-25 DIAGNOSIS — N951 Menopausal and female climacteric states: Secondary | ICD-10-CM

## 2015-04-25 MED ORDER — ESTRADIOL ACETATE 0.05 MG/24HR VA RING: VAGINAL_RING | VAGINAL | Status: DC

## 2015-04-25 NOTE — Telephone Encounter (Signed)
Thanks Christy!  

## 2015-04-25 NOTE — Telephone Encounter (Signed)
I am getting prior auth for FemRing  One of the questions is directions for use:   I see it just say use as directed  I need something more specific and how many does she use in a month?  Also what is Dx for this?

## 2015-04-27 ENCOUNTER — Other Ambulatory Visit: Payer: Self-pay | Admitting: Family

## 2015-04-27 ENCOUNTER — Telehealth: Payer: Self-pay

## 2015-04-27 DIAGNOSIS — Z6828 Body mass index (BMI) 28.0-28.9, adult: Secondary | ICD-10-CM | POA: Diagnosis not present

## 2015-04-27 DIAGNOSIS — E785 Hyperlipidemia, unspecified: Secondary | ICD-10-CM

## 2015-04-27 DIAGNOSIS — I10 Essential (primary) hypertension: Secondary | ICD-10-CM

## 2015-04-27 DIAGNOSIS — N951 Menopausal and female climacteric states: Secondary | ICD-10-CM

## 2015-04-27 DIAGNOSIS — M5106 Intervertebral disc disorders with myelopathy, lumbar region: Secondary | ICD-10-CM | POA: Diagnosis not present

## 2015-04-27 DIAGNOSIS — G894 Chronic pain syndrome: Secondary | ICD-10-CM | POA: Diagnosis not present

## 2015-04-27 MED ORDER — ALPRAZOLAM 0.5 MG PO TABS: ORAL_TABLET | ORAL | Status: DC

## 2015-04-27 MED ORDER — ESTRADIOL ACETATE 0.05 MG/24HR VA RING: VAGINAL_RING | VAGINAL | Status: DC

## 2015-04-27 MED ORDER — FUROSEMIDE 40 MG PO TABS
40.0000 mg | ORAL_TABLET | Freq: Every day | ORAL | Status: DC
Start: 2015-04-27 — End: 2015-12-24

## 2015-04-27 MED ORDER — PANTOPRAZOLE SODIUM 40 MG PO TBEC: DELAYED_RELEASE_TABLET | ORAL | Status: DC

## 2015-04-27 MED ORDER — TRAZODONE HCL 50 MG PO TABS: 50.0000 mg | ORAL_TABLET | Freq: Every day | ORAL | Status: DC

## 2015-04-27 MED ORDER — ATENOLOL 50 MG PO TABS: 50.0000 mg | ORAL_TABLET | Freq: Every day | ORAL | Status: DC

## 2015-04-27 MED ORDER — LACTULOSE 10 GM/15ML PO SOLN
10.0000 g | Freq: Two times a day (BID) | ORAL | Status: DC
Start: 2015-04-27 — End: 2017-05-06

## 2015-04-27 MED ORDER — CYCLOBENZAPRINE HCL 10 MG PO TABS: 10.0000 mg | ORAL_TABLET | Freq: Two times a day (BID) | ORAL | Status: DC | PRN

## 2015-04-27 MED ORDER — SIMVASTATIN 20 MG PO TABS: 20.0000 mg | ORAL_TABLET | Freq: Every day | ORAL | Status: DC

## 2015-04-27 MED ORDER — DILTIAZEM HCL ER COATED BEADS 120 MG PO CP24: 120.0000 mg | ORAL_CAPSULE | Freq: Every day | ORAL | Status: DC

## 2015-04-27 NOTE — Telephone Encounter (Signed)
lmovm that refills have been sent to pharmacy. Did need clarification on request for abx for her to call back

## 2015-04-27 NOTE — Telephone Encounter (Signed)
insurance prior authorized [facility] through 02/25/16

## 2015-04-27 NOTE — Telephone Encounter (Signed)
Refill off alprazolam called to [facility]

## 2015-04-27 NOTE — Telephone Encounter (Signed)
Prescriptions sent to [facility] in [place], (Please call in xanax). What does patient need an antibiotic for?

## 2015-05-16 ENCOUNTER — Ambulatory Visit (INDEPENDENT_AMBULATORY_CARE_PROVIDER_SITE_OTHER): Payer: Medicare Other | Admitting: Nurse Practitioner

## 2015-05-16 ENCOUNTER — Encounter: Payer: Self-pay | Admitting: Nurse Practitioner

## 2015-05-16 VITALS — BP 158/83 | HR 68 | Temp 97.0°F | Ht 62.0 in | Wt 159.0 lb

## 2015-05-16 DIAGNOSIS — M791 Myalgia, unspecified site: Secondary | ICD-10-CM

## 2015-05-16 NOTE — Progress Notes (Signed)
   Subjective:    Patient ID: Kaitlyn Serrano, female    DOB: 08-May-1940, 75 y.o.   MRN: [id number]  HPI  Patient in c/o bil leg pain- hurts worse when she tries to get up and walk. SHe is on zocor which she was told could make her legs hurt- started about 2 months ago and she has been on zocor for many years. Nothing seems to help pain.   Review of Systems  Constitutional: Negative.   HENT: Negative.   Respiratory: Negative.   Cardiovascular: Negative.   Genitourinary: Negative.   Neurological: Negative.   Psychiatric/Behavioral: Negative.   All other systems reviewed and are negative.      Objective:   Physical Exam  Constitutional: She is oriented to person, place, and time. She appears well-developed and well-nourished.  Cardiovascular: Normal rate, regular rhythm and normal heart sounds.   Pulmonary/Chest: Effort normal and breath sounds normal.  Musculoskeletal:   No calf swelling or edema- pain pn plapation il calf muscles.  Neurological: She is alert and oriented to person, place, and time.  Skin: Skin is warm and dry.  Psychiatric: She has a normal mood and affect. Her behavior is normal. Judgment and thought content normal.   BP 158/83 mmHg  Pulse 68  Temp(Src) 97 F (36.1 C) (Oral)  Ht 5\' 2"  (1.575 m)  Wt 159 lb (72.122 kg)  BMI 29.07 kg/m2       Assessment & Plan:   1. Myalgia    Stop zocor and see if helps with pain If does not help RTO  Orders Placed This Encounter  Procedures  . C-reactive protein   Mary-Margaret Hassell Done, FNP

## 2015-05-16 NOTE — Patient Instructions (Signed)
Muscle Pain, Adult  Muscle pain (myalgia) may be caused by many things, including:  · Overuse or muscle strain, especially if you are not in shape. This is the most common cause of muscle pain.  · Injury.  · Bruises.  · Viruses, such as the flu.  · Infectious diseases.  · Fibromyalgia, which is a chronic condition that causes muscle tenderness, fatigue, and headache.  · Autoimmune diseases, including lupus.  · Certain drugs, including ACE inhibitors and statins.  Muscle pain may be mild or severe. In most cases, the pain lasts only a short time and goes away without treatment. To diagnose the cause of your muscle pain, your health care provider will take your medical history. This means he or she will ask you when your muscle pain began and what has been happening. If you have not had muscle pain for very long, your health care provider may want to wait before doing much testing. If your muscle pain has lasted a long time, your health care provider may want to run tests right away. If your health care provider thinks your muscle pain may be caused by illness, you may need to have additional tests to rule out certain conditions.   Treatment for muscle pain depends on the cause. Home care is often enough to relieve muscle pain. Your health care provider may also prescribe anti-inflammatory medicine.  HOME CARE INSTRUCTIONS  Watch your condition for any changes. The following actions may help to lessen any discomfort you are feeling:  · Only take over-the-counter or prescription medicines as directed by your health care provider.  · Apply ice to the sore muscle:    Put ice in a plastic bag.    Place a towel between your skin and the bag.    Leave the ice on for 15-20 minutes, 3-4 times a day.  · You may alternate applying hot and cold packs to the muscle as directed by your health care provider.  · If overuse is causing your muscle pain, slow down your activities until the pain goes away.    Remember that it is normal  to feel some muscle pain after starting a workout program. Muscles that have not been used often will be sore at first.    Do regular, gentle exercises if you are not usually active.    Warm up before exercising to lower your risk of muscle pain.  · Do not continue working out if the pain is very bad. Bad pain could mean you have injured a muscle.  SEEK MEDICAL CARE IF:  · Your muscle pain gets worse, and medicines do not help.  · You have muscle pain that lasts longer than 3 days.  · You have a rash or fever along with muscle pain.  · You have muscle pain after a tick bite.  · You have muscle pain while working out, even though you are in good physical condition.  · You have redness, soreness, or swelling along with muscle pain.  · You have muscle pain after starting a new medicine or changing the dose of a medicine.  SEEK IMMEDIATE MEDICAL CARE IF:  · You have trouble breathing.  · You have trouble swallowing.  · You have muscle pain along with a stiff neck, fever, and vomiting.  · You have severe muscle weakness or cannot move part of your body.  MAKE SURE YOU:   · Understand these instructions.  · Will watch your condition.  · Will get   help right away if you are not doing well or get worse.     This information is not intended to replace advice given to you by your health care provider. Make sure you discuss any questions you have with your health care provider.     Document Released: 01/03/2006 Document Revised: 03/04/2014 Document Reviewed: 12/08/2012  Elsevier Interactive Patient Education ©2016 [employer]

## 2015-05-18 LAB — C-REACTIVE PROTEIN: CRP: 4.1 mg/L (ref 0.0–4.9)

## 2015-05-19 ENCOUNTER — Telehealth: Payer: Self-pay | Admitting: Nurse Practitioner

## 2015-05-19 NOTE — Telephone Encounter (Signed)
Stp and advised her CRP was WNL, she did say her legs are feeling better since you lowered her cholesterol medicine. You haven't reviewed the CRP hope it's ok I told her.

## 2015-05-20 NOTE — Telephone Encounter (Signed)
crp was normal- just continue to hold statin as discussed

## 2015-05-31 DIAGNOSIS — G894 Chronic pain syndrome: Secondary | ICD-10-CM | POA: Diagnosis not present

## 2015-05-31 DIAGNOSIS — Z79899 Other long term (current) drug therapy: Secondary | ICD-10-CM | POA: Diagnosis not present

## 2015-05-31 DIAGNOSIS — M25561 Pain in right knee: Secondary | ICD-10-CM | POA: Diagnosis not present

## 2015-05-31 DIAGNOSIS — M791 Myalgia: Secondary | ICD-10-CM | POA: Diagnosis not present

## 2015-05-31 DIAGNOSIS — Z79891 Long term (current) use of opiate analgesic: Secondary | ICD-10-CM | POA: Diagnosis not present

## 2015-05-31 DIAGNOSIS — M5106 Intervertebral disc disorders with myelopathy, lumbar region: Secondary | ICD-10-CM | POA: Diagnosis not present

## 2015-06-20 ENCOUNTER — Ambulatory Visit (INDEPENDENT_AMBULATORY_CARE_PROVIDER_SITE_OTHER): Payer: Medicare Other

## 2015-06-20 ENCOUNTER — Encounter: Payer: Self-pay | Admitting: Nurse Practitioner

## 2015-06-20 ENCOUNTER — Ambulatory Visit (INDEPENDENT_AMBULATORY_CARE_PROVIDER_SITE_OTHER): Payer: Medicare Other | Admitting: Nurse Practitioner

## 2015-06-20 VITALS — BP 134/80 | HR 72 | Temp 97.0°F | Ht 62.0 in | Wt 155.0 lb

## 2015-06-20 DIAGNOSIS — R1032 Left lower quadrant pain: Secondary | ICD-10-CM

## 2015-06-20 DIAGNOSIS — K5732 Diverticulitis of large intestine without perforation or abscess without bleeding: Secondary | ICD-10-CM

## 2015-06-20 DIAGNOSIS — K5731 Diverticulosis of large intestine without perforation or abscess with bleeding: Secondary | ICD-10-CM | POA: Diagnosis not present

## 2015-06-20 MED ORDER — CIPROFLOXACIN HCL 500 MG PO TABS: 500.0000 mg | ORAL_TABLET | Freq: Two times a day (BID) | ORAL | Status: DC

## 2015-06-20 MED ORDER — METRONIDAZOLE 500 MG PO TABS: 500.0000 mg | ORAL_TABLET | Freq: Two times a day (BID) | ORAL | Status: DC

## 2015-06-20 NOTE — Patient Instructions (Signed)
Diverticulitis °Diverticulitis is inflammation or infection of small pouches in your colon that form when you have a condition called diverticulosis. The pouches in your colon are called diverticula. Your colon, or large intestine, is where water is absorbed and stool is formed. °Complications of diverticulitis can include: °· Bleeding. °· Severe infection. °· Severe pain. °· Perforation of your colon. °· Obstruction of your colon. °CAUSES  °Diverticulitis is caused by bacteria. °Diverticulitis happens when stool becomes trapped in diverticula. This allows bacteria to grow in the diverticula, which can lead to inflammation and infection. °RISK FACTORS °People with diverticulosis are at risk for diverticulitis. Eating a diet that does not include enough fiber from fruits and vegetables may make diverticulitis more likely to develop. °SYMPTOMS  °Symptoms of diverticulitis may include: °· Abdominal pain and tenderness. The pain is normally located on the left side of the abdomen, but may occur in other areas. °· Fever and chills. °· Bloating. °· Cramping. °· Nausea. °· Vomiting. °· Constipation. °· Diarrhea. °· Blood in your stool. °DIAGNOSIS  °Your health care provider will ask you about your medical history and do a physical exam. You may need to have tests done because many medical conditions can cause the same symptoms as diverticulitis. Tests may include: °· Blood tests. °· Urine tests. °· Imaging tests of the abdomen, including X-rays and CT scans. °When your condition is under control, your health care provider may recommend that you have a colonoscopy. A colonoscopy can show how severe your diverticula are and whether something else is causing your symptoms. °TREATMENT  °Most cases of diverticulitis are mild and can be treated at home. Treatment may include: °· Taking over-the-counter pain medicines. °· Following a clear liquid diet. °· Taking antibiotic medicines by mouth for 7-10 days. °More severe cases may  be treated at a hospital. Treatment may include: °· Not eating or drinking. °· Taking prescription pain medicine. °· Receiving antibiotic medicines through an IV tube. °· Receiving fluids and nutrition through an IV tube. °· Surgery. °HOME CARE INSTRUCTIONS  °· Follow your health care provider's instructions carefully. °· Follow a full liquid diet or other diet as directed by your health care provider. After your symptoms improve, your health care provider may tell you to change your diet. He or she may recommend you eat a high-fiber diet. Fruits and vegetables are good sources of fiber. Fiber makes it easier to pass stool. °· Take fiber supplements or probiotics as directed by your health care provider. °· Only take medicines as directed by your health care provider. °· Keep all your follow-up appointments. °SEEK MEDICAL CARE IF:  °· Your pain does not improve. °· You have a hard time eating food. °· Your bowel movements do not return to normal. °SEEK IMMEDIATE MEDICAL CARE IF:  °· Your pain becomes worse. °· Your symptoms do not get better. °· Your symptoms suddenly get worse. °· You have a fever. °· You have repeated vomiting. °· You have bloody or black, tarry stools. °MAKE SURE YOU:  °· Understand these instructions. °· Will watch your condition. °· Will get help right away if you are not doing well or get worse. °  °This information is not intended to replace advice given to you by your health care provider. Make sure you discuss any questions you have with your health care provider. °  °Document Released: 11/21/2004 Document Revised: 02/16/2013 Document Reviewed: 01/06/2013 °Elsevier Interactive Patient Education ©2016 [employer] Inc[employer] ° °

## 2015-06-20 NOTE — Progress Notes (Signed)
   Subjective:    Patient ID: Kaitlyn Serrano, female    DOB: 10/14/1940, 75 y.o.   MRN: [id number]  HPI  Patient in c/o stomach pain- says that the pain is a stabbing pain that is now constant. She says she has a history of diverticulitis even though not in chart history. She denies fever, nausea \, vomiting, diarrhea or constipation. Did see bright red bloood in stool several days ago.    Review of Systems  Constitutional: Negative.   HENT: Negative.   Respiratory: Negative.   Cardiovascular: Negative.   Genitourinary: Negative.   Neurological: Negative.   Psychiatric/Behavioral: Negative.   All other systems reviewed and are negative.      Objective:   Physical Exam  Constitutional: She is oriented to person, place, and time. She appears well-developed and well-nourished. No distress.  Cardiovascular: Normal rate, regular rhythm and normal heart sounds.   Pulmonary/Chest: Effort normal and breath sounds normal.  Abdominal: Soft. Bowel sounds are normal. There is tenderness (left lower quadrant).  Neurological: She is alert and oriented to person, place, and time.  Skin: Skin is warm.  Psychiatric: She has a normal mood and affect. Her behavior is normal. Judgment and thought content normal.    BP 134/80 mmHg  Pulse 72  Temp(Src) 97 F (36.1 C) (Oral)  Ht 5\' 2"  (1.575 m)  Wt 155 lb (70.308 kg)  BMI 28.34 kg/m2  KUB negative-Preliminary reading by Ronnald Collum, FNP  [facility]       Assessment & Plan:  1. Abdominal pain, left lower quadrant - DG Abd 1 View; Future - CBC with Differential/Platelet  2. Diverticulosis of colon with hemorrhage  3. Diverticulitis of colon without hemorrhage Do not eat foods with small seeds or indigestible skin - ciprofloxacin (CIPRO) 500 MG tablet; Take 1 tablet (500 mg total) by mouth 2 (two) times daily.  Dispense: 20 tablet; Refill: 0 - metroNIDAZOLE (FLAGYL) 500 MG tablet; Take 1 tablet (500 mg total) by mouth 2 (two) times daily.   Dispense: 14 tablet; Refill: 0    Labs pending Health maintenance reviewed Diet and exercise encouraged Continue all meds Follow up  In prn   [place], FNP

## 2015-06-21 LAB — CBC WITH DIFFERENTIAL/PLATELET
BASOS ABS: 0 10*3/uL (ref 0.0–0.2)
BASOS: 0 %
EOS (ABSOLUTE): 0.3 10*3/uL (ref 0.0–0.4)
Eos: 3 %
HEMOGLOBIN: 14.5 g/dL (ref 11.1–15.9)
Hematocrit: 44.4 % (ref 34.0–46.6)
Immature Grans (Abs): 0.1 10*3/uL (ref 0.0–0.1)
Immature Granulocytes: 1 %
LYMPHS ABS: 1.9 10*3/uL (ref 0.7–3.1)
Lymphs: 20 %
MCH: 30 pg (ref 26.6–33.0)
MCHC: 32.7 g/dL (ref 31.5–35.7)
MCV: 92 fL (ref 79–97)
MONOCYTES: 10 %
Monocytes Absolute: 0.9 10*3/uL (ref 0.1–0.9)
NEUTROS ABS: 6.4 10*3/uL (ref 1.4–7.0)
Neutrophils: 66 %
Platelets: 268 10*3/uL (ref 150–379)
RBC: 4.83 x10E6/uL (ref 3.77–5.28)
RDW: 13.6 % (ref 12.3–15.4)
WBC: 9.5 10*3/uL (ref 3.4–10.8)

## 2015-06-28 DIAGNOSIS — M5106 Intervertebral disc disorders with myelopathy, lumbar region: Secondary | ICD-10-CM | POA: Diagnosis not present

## 2015-06-28 DIAGNOSIS — G894 Chronic pain syndrome: Secondary | ICD-10-CM | POA: Diagnosis not present

## 2015-06-29 DIAGNOSIS — H40013 Open angle with borderline findings, low risk, bilateral: Secondary | ICD-10-CM | POA: Diagnosis not present

## 2015-06-29 DIAGNOSIS — Z961 Presence of intraocular lens: Secondary | ICD-10-CM | POA: Diagnosis not present

## 2015-07-04 DIAGNOSIS — H6121 Impacted cerumen, right ear: Secondary | ICD-10-CM | POA: Diagnosis not present

## 2015-07-04 DIAGNOSIS — S0991XA Unspecified injury of ear, initial encounter: Secondary | ICD-10-CM | POA: Diagnosis not present

## 2015-07-28 DIAGNOSIS — G894 Chronic pain syndrome: Secondary | ICD-10-CM | POA: Diagnosis not present

## 2015-07-28 DIAGNOSIS — M5106 Intervertebral disc disorders with myelopathy, lumbar region: Secondary | ICD-10-CM | POA: Diagnosis not present

## 2015-08-07 ENCOUNTER — Ambulatory Visit (INDEPENDENT_AMBULATORY_CARE_PROVIDER_SITE_OTHER): Payer: Medicare Other | Admitting: Family

## 2015-08-07 ENCOUNTER — Encounter: Payer: Self-pay | Admitting: Family

## 2015-08-07 VITALS — BP 138/77 | HR 59 | Temp 97.4°F | Ht 62.0 in | Wt 156.0 lb

## 2015-08-07 DIAGNOSIS — W57XXXA Bitten or stung by nonvenomous insect and other nonvenomous arthropods, initial encounter: Secondary | ICD-10-CM | POA: Diagnosis not present

## 2015-08-07 DIAGNOSIS — S80922A Unspecified superficial injury of left lower leg, initial encounter: Secondary | ICD-10-CM

## 2015-08-07 DIAGNOSIS — S70922A Unspecified superficial injury of left thigh, initial encounter: Secondary | ICD-10-CM | POA: Diagnosis not present

## 2015-08-07 DIAGNOSIS — S80921A Unspecified superficial injury of right lower leg, initial encounter: Secondary | ICD-10-CM | POA: Diagnosis not present

## 2015-08-07 MED ORDER — TRIAMCINOLONE ACETONIDE 0.5 % EX OINT: 1.0000 "application " | TOPICAL_OINTMENT | Freq: Two times a day (BID) | CUTANEOUS | Status: DC

## 2015-08-07 MED ORDER — PREDNISONE 10 MG (21) PO TBPK: 10.0000 mg | ORAL_TABLET | Freq: Every day | ORAL | Status: DC

## 2015-08-07 NOTE — Patient Instructions (Signed)
Insect Bite Mosquitoes, flies, fleas, bedbugs, and many other insects can bite. Insect bites are different from insect stings. A sting is when poison (venom) is injected into the skin. Insect bites can cause pain or itching for a few days, but they are usually not serious. Some insects can spread diseases to people through a bite. SYMPTOMS  Symptoms of an insect bite include:  Itching or pain in the bite area.  Redness and swelling in the bite area.  An open wound (skin ulcer). In many cases, symptoms last for 2-4 days.  DIAGNOSIS  This condition is usually diagnosed based on symptoms and a physical exam. TREATMENT  Treatment is usually not needed for an insect bite. Symptoms often go away on their own. Your health care provider may recommend creams or lotions to help reduce itching. Antibiotic medicines may be prescribed if the bite becomes infected. A tetanus shot may be given in some cases. If you develop an allergic reaction to an insect bite, your health care provider will prescribe medicines to treat the reaction (antihistamines). This is rare. HOME CARE INSTRUCTIONS  Do not scratch the bite area.  Keep the bite area clean and dry. Wash the bite area daily with soap and water as told by your health care provider.  If directed, applyice to the bite area.  Put ice in a plastic bag.  Place a towel between your skin and the bag.  Leave the ice on for 20 minutes, 2-3 times per day.  To help reduce itching and swelling, try applying a baking soda paste, cortisone cream, or calamine lotion to the bite area as told by your health care provider.  Apply or take over-the-counter and prescription medicines only as told by your health care provider.  If you were prescribed an antibiotic medicine, use it as told by your health care provider. Do not stop using the antibiotic even if your condition improves.  Keep all follow-up visits as told by your health care provider. This is  important. PREVENTION   Use insect repellent. The best insect repellents contain:  DEET, picaridin, oil of lemon eucalyptus (OLE), or IR3535.  Higher amounts of an active ingredient.  When you are outdoors, wear clothing that covers your arms and legs.  Avoid opening windows that do not have window screens. SEEK MEDICAL CARE IF:  You have increased redness, swelling, or pain in the bite area.  You have a fever. SEEK IMMEDIATE MEDICAL CARE IF:   You have joint pain.   You have fluid, blood, or pus coming from the bite area.  You have a headache or neck pain.  You have unusual weakness.  You have a rash.  You have chest pain or shortness of breath.  You have abdominal pain, nausea, or vomiting.  You feel unusually tired or sleepy.   This information is not intended to replace advice given to you by your health care provider. Make sure you discuss any questions you have with your health care provider.   Document Released: 03/21/2004 Document Revised: 11/02/2014 Document Reviewed: 06/29/2014 Elsevier Interactive Patient Education 2016 Elsevier Inc.  

## 2015-08-07 NOTE — Progress Notes (Signed)
   Subjective:    Patient ID: Kaitlyn Serrano, female    DOB: May 20, 1940, 75 y.o.   MRN: [id number]  Rash This is a new problem. The current episode started in the past 7 days. The problem is unchanged. The affected locations include the groin, left lower leg, left upper leg, right lowerleg and right upper leg. The rash is characterized by redness and itchiness. Associated with: sitting outside on ground. Pertinent negatives include no congestion, cough, eye pain, joint pain or shortness of breath. Past treatments include anti-itch cream. The treatment provided mild relief.      Review of Systems  HENT: Negative for congestion.   Eyes: Negative for pain.  Respiratory: Negative for cough and shortness of breath.   Musculoskeletal: Negative for joint pain.  Skin: Positive for rash.  All other systems reviewed and are negative.      Objective:   Physical Exam  Constitutional: She is oriented to person, place, and time. She appears well-developed and well-nourished. No distress.  HENT:  Head: Normocephalic.  Eyes: Pupils are equal, round, and reactive to light.  Neck: Normal range of motion. Neck supple. No thyromegaly present.  Cardiovascular: Normal rate, regular rhythm, normal heart sounds and intact distal pulses.   No murmur heard. Pulmonary/Chest: Effort normal and breath sounds normal. No respiratory distress. She has no wheezes.  Abdominal: Soft. Bowel sounds are normal. She exhibits no distension. There is no tenderness.  Musculoskeletal: Normal range of motion. She exhibits no edema or tenderness.  Neurological: She is alert and oriented to person, place, and time.  Skin: Skin is warm and dry.  Psychiatric: She has a normal mood and affect. Her behavior is normal. Judgment and thought content normal.  Vitals reviewed.  BP 138/77 mmHg  Pulse 59  Temp(Src) 97.4 F (36.3 C) (Oral)  Ht 5\' 2"  (1.575 m)  Wt 156 lb (70.761 kg)  BMI 28.53 kg/m2        Assessment & Plan:   1. Insect bites -Do not scratch -Wear protective clothing while outside- Long sleeves and long pants -Put insect repellent on all exposed skin and along clothing -Take a shower as soon as possible after being outside -RTO prn - triamcinolone ointment (KENALOG) 0.5 %; Apply 1 application topically 2 (two) times daily.  Dispense: 30 g; Refill: 0 - predniSONE (STERAPRED UNI-PAK 21 TAB) 10 MG (21) TBPK tablet; Take 1 tablet (10 mg total) by mouth daily. As directed x 6 days  Dispense: 21 tablet; Refill: 0  Evelina Dun, FNP

## 2015-08-14 ENCOUNTER — Ambulatory Visit (INDEPENDENT_AMBULATORY_CARE_PROVIDER_SITE_OTHER): Payer: Medicare Other | Admitting: Physician Assistant

## 2015-08-14 VITALS — BP 143/73 | HR 65 | Temp 97.8°F | Ht 62.0 in | Wt 156.0 lb

## 2015-08-14 DIAGNOSIS — S51812A Laceration without foreign body of left forearm, initial encounter: Secondary | ICD-10-CM | POA: Diagnosis not present

## 2015-08-14 NOTE — Progress Notes (Signed)
Subjective:     Patient ID: Kaitlyn Serrano, female   DOB: [birth year], 75 y.o.   MRN: [id number]  HPI Pt had her sliding window come down on the L forearm  Review of Systems + bruising and bleeding to the arm No numbness to the area No radiation of pain or numbness    Objective:   Physical Exam  2 skins tears to the lateral forearm ~ 2 cm each + hematoma FROM elbow and wrist Good pulses Sensory intact Wound cleansed Able to uncurl edges an approx wound Steri-strips then placed Dressing placed    Assessment:     1. Skin tear of forearm without complication, left, initial encounter        Plan:     Wound care reviewed Steri strips placed Pt informed of S/S of infection  F/U prn

## 2015-08-14 NOTE — Patient Instructions (Signed)
Skin Tear Care  A skin tear is a wound in which the top layer of skin has peeled off. This is a common problem with aging because the skin becomes thinner and more fragile as a person gets older. In addition, some medicines, such as oral corticosteroids, can lead to skin thinning if taken for long periods of time.   A skin tear is often repaired with tape or skin adhesive strips. This keeps the skin that has been peeled off in contact with the healthier skin beneath. Depending on the location of the wound, a bandage (dressing) may be applied over the tape or skin adhesive strips. Sometimes, during the healing process, the skin turns black and dies. Even when this happens, the torn skin acts as a good dressing until the skin underneath gets healthier and repairs itself.  HOME CARE INSTRUCTIONS    Change dressings once per day or as directed by your caregiver.   Gently clean the skin tear and the area around the tear using saline solution or mild soap and water.   Do not rub the injured skin dry. Let the area air dry.   Apply petroleum jelly or an antibiotic cream or ointment to keep the tear moist. This will help the wound heal. Do not allow a scab to form.   If the dressing sticks before the next dressing change, moisten it with warm soapy water and gently remove it.   Protect the injured skin until it has healed.   Only take over-the-counter or prescription medicines as directed by your caregiver.   Take showers or baths using warm soapy water. Apply a new dressing after the shower or bath.   Keep all follow-up appointments as directed by your caregiver.   SEEK IMMEDIATE MEDICAL CARE IF:    You have redness, swelling, or increasing pain in the skin tear.   You havepus coming from the skin tear.   You have chills.   You have a red streak that goes away from the skin tear.   You have a bad smell coming from the tear or dressing.   You have a fever or persistent symptoms for more than 2-3 days.   You  have a fever and your symptoms suddenly get worse.  MAKE SURE YOU:   Understand these instructions.   Will watch this condition.   Will get help right away if your child is not doing well or gets worse.     This information is not intended to replace advice given to you by your health care provider. Make sure you discuss any questions you have with your health care provider.     Document Released: 11/06/2000 Document Revised: 11/06/2011 Document Reviewed: 08/26/2011  Elsevier Interactive Patient Education 2016 Elsevier Inc.

## 2015-08-25 DIAGNOSIS — M546 Pain in thoracic spine: Secondary | ICD-10-CM | POA: Diagnosis not present

## 2015-08-25 DIAGNOSIS — M5106 Intervertebral disc disorders with myelopathy, lumbar region: Secondary | ICD-10-CM | POA: Diagnosis not present

## 2015-08-25 DIAGNOSIS — Z6828 Body mass index (BMI) 28.0-28.9, adult: Secondary | ICD-10-CM | POA: Diagnosis not present

## 2015-08-25 DIAGNOSIS — G894 Chronic pain syndrome: Secondary | ICD-10-CM | POA: Diagnosis not present

## 2015-09-09 ENCOUNTER — Other Ambulatory Visit: Payer: Self-pay | Admitting: Family

## 2015-09-26 DIAGNOSIS — M5106 Intervertebral disc disorders with myelopathy, lumbar region: Secondary | ICD-10-CM | POA: Diagnosis not present

## 2015-09-26 DIAGNOSIS — G894 Chronic pain syndrome: Secondary | ICD-10-CM | POA: Diagnosis not present

## 2015-09-26 DIAGNOSIS — M791 Myalgia: Secondary | ICD-10-CM | POA: Diagnosis not present

## 2015-09-29 ENCOUNTER — Encounter (INDEPENDENT_AMBULATORY_CARE_PROVIDER_SITE_OTHER): Payer: Self-pay

## 2015-09-29 ENCOUNTER — Ambulatory Visit (INDEPENDENT_AMBULATORY_CARE_PROVIDER_SITE_OTHER): Payer: Medicare Other | Admitting: Nurse Practitioner

## 2015-09-29 ENCOUNTER — Encounter: Payer: Self-pay | Admitting: Nurse Practitioner

## 2015-09-29 VITALS — BP 153/86 | HR 61 | Temp 96.7°F | Ht 62.0 in | Wt 154.0 lb

## 2015-09-29 DIAGNOSIS — N3000 Acute cystitis without hematuria: Secondary | ICD-10-CM | POA: Diagnosis not present

## 2015-09-29 DIAGNOSIS — R300 Dysuria: Secondary | ICD-10-CM | POA: Diagnosis not present

## 2015-09-29 LAB — URINALYSIS, COMPLETE
Bilirubin, UA: NEGATIVE
GLUCOSE, UA: NEGATIVE
KETONES UA: NEGATIVE
NITRITE UA: NEGATIVE
Protein, UA: NEGATIVE
SPEC GRAV UA: 1.02 (ref 1.005–1.030)
Urobilinogen, Ur: 0.2 mg/dL (ref 0.2–1.0)
pH, UA: 7 (ref 5.0–7.5)

## 2015-09-29 LAB — MICROSCOPIC EXAMINATION

## 2015-09-29 MED ORDER — CIPROFLOXACIN HCL 500 MG PO TABS: 500.0000 mg | ORAL_TABLET | Freq: Two times a day (BID) | ORAL | 0 refills | Status: DC

## 2015-09-29 MED ORDER — FLUCONAZOLE 150 MG PO TABS: 150.0000 mg | ORAL_TABLET | Freq: Once | ORAL | 0 refills | Status: AC

## 2015-09-29 NOTE — Progress Notes (Signed)
[id number] Kaitlyn Gilford, FNP Chief Complaint  Patient presents with  . Urinary Tract Infection    Current Issues:  Presents with 14 days of dysuria, urinary urgency and urinary frequency Associated symptoms include:  chills and urinary hesitancy  There is a previous history of of similar symptoms. Sexually active:  Yes with female.   No concern for STI.  Prior to Admission medications   Medication Sig Start Date End Date Taking? Authorizing Provider  ALPRAZolam Duanne Moron) 0.5 MG tablet TAKE 1/2 TABLET TWICE DAILY AS NEEDED FOR SLEEP OR ANXIETY 04/27/15  Yes Sharion Balloon, FNP  atenolol (TENORMIN) 50 MG tablet TAKE ONE TABLET BY MOUTH ONCE DAILY 09/11/15  Yes Sharion Balloon, FNP  cyclobenzaprine (FLEXERIL) 10 MG tablet Take 1 tablet (10 mg total) by mouth 2 (two) times daily as needed for muscle spasms. 04/27/15  Yes Sharion Balloon, FNP  diltiazem (CARDIZEM CD) 120 MG 24 hr capsule Take 1 capsule (120 mg total) by mouth daily. 04/27/15  Yes Sharion Balloon, FNP  EPINEPHrine 0.3 mg/0.3 mL IJ SOAJ injection Inject 0.3 mLs (0.3 mg total) into the muscle once. 10/06/13  Yes Lysbeth Penner, FNP  Estradiol Acetate (FEMRING) 0.05 MG/24HR RING Insert ring PV every 3 months 04/27/15  Yes Sharion Balloon, FNP  furosemide (LASIX) 40 MG tablet Take 1 tablet (40 mg total) by mouth daily. 04/27/15  Yes Sharion Balloon, FNP  HYDROcodone-acetaminophen (NORCO) 10-325 MG tablet Take 1 tablet by mouth every 8 (eight) hours.   Yes Historical Provider, MD  lactulose (CHRONULAC) 10 GM/15ML solution Take 15 mLs (10 g total) by mouth 2 (two) times daily. 04/27/15  Yes Sharion Balloon, FNP  metroNIDAZOLE (FLAGYL) 500 MG tablet Take 1 tablet (500 mg total) by mouth 2 (two) times daily. 06/20/15  Yes Mary-Margaret Hassell Done, FNP  pantoprazole (PROTONIX) 40 MG tablet TAKE  (1)  TABLET TWICE A DAY. 04/27/15  Yes Sharion Balloon, FNP  polyethylene glycol powder (GLYCOLAX/MIRALAX) powder Take 17 g by mouth 2 (two) times daily as needed. 12/08/13   Yes Sharion Balloon, FNP  traZODone (DESYREL) 50 MG tablet Take 1 tablet (50 mg total) by mouth at bedtime. 04/27/15  Yes Sharion Balloon, FNP  triamcinolone ointment (KENALOG) 0.5 % Apply 1 application topically 2 (two) times daily. 08/07/15  Yes Sharion Balloon, FNP    Review of Systems:all other negative  PE:  BP (!) 184/84   Pulse 61   Temp (!) 96.7 F (35.9 C) (Oral)   Ht 5\' 2"  (1.575 m)   Wt 154 lb (69.9 kg)   BMI 28.17 kg/m  Constitution- alert and oriented- no distress Heart- RRR Lungs- clear in all fields Back- no pain Abdomen mild suprapubic pain on palaption- mild CVA tenderness on right Pelvic- no pelvic exam done today  UA- (+) leuks  6-10 WBC   Assessment and Plan:  1. Dysuria Take medication as prescribe Cotton underwear Take shower not bath Cranberry juice, yogurt Force fluids AZO over the counter X2 days Culture pending RTO prn  Meds ordered this encounter  Medications  . ciprofloxacin (CIPRO) 500 MG tablet    Sig: Take 1 tablet (500 mg total) by mouth 2 (two) times daily.    Dispense:  6 tablet    Refill:  0    Order Specific Question:   Supervising Provider    Answer:   VINCENT, CAROL L [[id number]]  . fluconazole (DIFLUCAN) 150 MG tablet    Sig: Take 1 tablet (  150 mg total) by mouth once.    Dispense:  1 tablet    Refill:  0    Order Specific Question:   Supervising Provider    Answer:   Evette Doffing, CAROL L [[id number]]    - Urinalysis, Complete  Mary-Margaret Hassell Done, FNP

## 2015-09-29 NOTE — Patient Instructions (Signed)

## 2015-10-02 ENCOUNTER — Other Ambulatory Visit: Payer: Self-pay | Admitting: Nurse Practitioner

## 2015-10-02 LAB — URINE CULTURE

## 2015-10-02 MED ORDER — NITROFURANTOIN MONOHYD MACRO 100 MG PO CAPS: 100.0000 mg | ORAL_CAPSULE | Freq: Two times a day (BID) | ORAL | 0 refills | Status: DC

## 2015-10-03 ENCOUNTER — Ambulatory Visit (INDEPENDENT_AMBULATORY_CARE_PROVIDER_SITE_OTHER): Payer: Medicare Other

## 2015-10-03 ENCOUNTER — Telehealth: Payer: Self-pay | Admitting: Nurse Practitioner

## 2015-10-03 DIAGNOSIS — N390 Urinary tract infection, site not specified: Secondary | ICD-10-CM | POA: Insufficient documentation

## 2015-10-03 MED ORDER — CEFTRIAXONE SODIUM 1 G IJ SOLR
1.0000 g | Freq: Once | INTRAMUSCULAR | Status: AC
  Administered 2015-10-03: 1 g via INTRAMUSCULAR

## 2015-10-03 NOTE — Telephone Encounter (Signed)
-----   Message from Chevis Pretty, [place] sent at 10/02/2015  8:10 AM EDT ----- Culture grew strange bacteria which is resistant  To cipro- patient is allergic to pencillin will need macrobid and rocephin shot 1gm

## 2015-10-03 NOTE — Progress Notes (Signed)
Rocephin 1 gm injection administered to left upper outer quadrant.  Patient tolerated well.

## 2015-10-03 NOTE — Telephone Encounter (Signed)
Pt notified of results Verbalizes understanding She will come in today for Rocephin inj

## 2015-10-24 ENCOUNTER — Encounter: Payer: Self-pay | Admitting: Family

## 2015-10-24 ENCOUNTER — Ambulatory Visit (INDEPENDENT_AMBULATORY_CARE_PROVIDER_SITE_OTHER): Payer: Medicare Other | Admitting: Family

## 2015-10-24 VITALS — BP 136/71 | HR 55 | Temp 97.1°F | Ht 62.0 in | Wt 158.8 lb

## 2015-10-24 DIAGNOSIS — L298 Other pruritus: Secondary | ICD-10-CM | POA: Diagnosis not present

## 2015-10-24 DIAGNOSIS — F411 Generalized anxiety disorder: Secondary | ICD-10-CM | POA: Diagnosis not present

## 2015-10-24 DIAGNOSIS — J302 Other seasonal allergic rhinitis: Secondary | ICD-10-CM

## 2015-10-24 DIAGNOSIS — K219 Gastro-esophageal reflux disease without esophagitis: Secondary | ICD-10-CM | POA: Diagnosis not present

## 2015-10-24 DIAGNOSIS — E785 Hyperlipidemia, unspecified: Secondary | ICD-10-CM

## 2015-10-24 DIAGNOSIS — G4700 Insomnia, unspecified: Secondary | ICD-10-CM | POA: Diagnosis not present

## 2015-10-24 DIAGNOSIS — I10 Essential (primary) hypertension: Secondary | ICD-10-CM | POA: Diagnosis not present

## 2015-10-24 DIAGNOSIS — K5900 Constipation, unspecified: Secondary | ICD-10-CM

## 2015-10-24 DIAGNOSIS — E559 Vitamin D deficiency, unspecified: Secondary | ICD-10-CM | POA: Diagnosis not present

## 2015-10-24 DIAGNOSIS — B373 Candidiasis of vulva and vagina: Secondary | ICD-10-CM | POA: Diagnosis not present

## 2015-10-24 DIAGNOSIS — N898 Other specified noninflammatory disorders of vagina: Secondary | ICD-10-CM

## 2015-10-24 DIAGNOSIS — B3731 Acute candidiasis of vulva and vagina: Secondary | ICD-10-CM

## 2015-10-24 LAB — URINALYSIS, COMPLETE
Bilirubin, UA: NEGATIVE
Glucose, UA: NEGATIVE
KETONES UA: NEGATIVE
Leukocytes, UA: NEGATIVE
NITRITE UA: NEGATIVE
PH UA: 7.5 (ref 5.0–7.5)
Protein, UA: NEGATIVE
RBC UA: NEGATIVE
SPEC GRAV UA: 1.015 (ref 1.005–1.030)
Urobilinogen, Ur: 1 mg/dL (ref 0.2–1.0)

## 2015-10-24 LAB — WET PREP FOR TRICH, YEAST, CLUE
CLUE CELL EXAM: NEGATIVE
Trichomonas Exam: NEGATIVE

## 2015-10-24 LAB — MICROSCOPIC EXAMINATION: RBC, UA: NONE SEEN /hpf (ref 0–?)

## 2015-10-24 MED ORDER — FLUCONAZOLE 150 MG PO TABS: 150.0000 mg | ORAL_TABLET | ORAL | 0 refills | Status: DC | PRN

## 2015-10-24 NOTE — Progress Notes (Signed)
Subjective:    Patient ID: Kaitlyn Serrano, female    DOB: [birth year], 75 y.o.   MRN: [id number]  Pt presents to the office today for chronic follow up.  Hypertension  This is a chronic problem. The current episode started more than 1 year ago. The problem has been resolved since onset. The problem is controlled. Associated symptoms include anxiety. Pertinent negatives include no malaise/fatigue, palpitations, peripheral edema or shortness of breath. Risk factors for coronary artery disease include dyslipidemia, obesity and post-menopausal state. Past treatments include beta blockers and calcium channel blockers. The current treatment provides significant improvement. There is no history of kidney disease, CAD/MI, heart failure or a thyroid problem. There is no history of sleep apnea.  Hyperlipidemia  This is a chronic problem. The current episode started more than 1 year ago. The problem is controlled. Recent lipid tests were reviewed and are normal. Associated symptoms include myalgias. Pertinent negatives include no leg pain or shortness of breath. Current antihyperlipidemic treatment includes statins. The current treatment provides moderate improvement of lipids. Risk factors for coronary artery disease include dyslipidemia, family history, hypertension, obesity and post-menopausal.  Gastroesophageal Reflux  She reports no coughing, no heartburn or no nausea. This is a chronic problem. The current episode started more than 1 year ago. The problem occurs rarely. The problem has been resolved. Risk factors include obesity. She has tried a PPI for the symptoms. The treatment provided significant relief.  Anxiety  Presents for follow-up visit. Onset was 1 to 5 years ago. The problem has been waxing and waning. Symptoms include excessive worry. Patient reports no insomnia, irritability, nausea, palpitations or shortness of breath. Symptoms occur occasionally. The severity of symptoms is moderate. The  quality of sleep is good. Nighttime awakenings: none.   Her past medical history is significant for anxiety/panic attacks. Past treatments include benzodiazephines.  Constipation  This is a chronic problem. The current episode started more than 1 year ago. The problem has been resolved since onset. Her stool frequency is 2 to 3 times per week. Pertinent negatives include no bloating, nausea or vomiting. Risk factors include immobility. She has tried laxatives for the symptoms. The treatment provided moderate relief.  Insomnia  Primary symptoms: no difficulty falling asleep, no frequent awakening, no malaise/fatigue.  The current episode started more than one year. The onset quality is gradual. The problem occurs intermittently. The symptoms are aggravated by anxiety. How many beverages per day that contain caffeine: 2-3.  Past treatments include medication. The treatment provided significant relief. Typical bedtime:  8-10 P.M..   Dysuria   This is a new problem. The problem occurs intermittently. The problem has been waxing and waning. The quality of the pain is described as aching. Associated symptoms include a discharge, flank pain and frequency. Pertinent negatives include no hematuria, nausea or vomiting. She has tried antibiotics for the symptoms. The treatment provided moderate relief.      Review of Systems  Constitutional: Negative.  Negative for irritability and malaise/fatigue.  HENT: Negative.   Eyes: Negative.   Respiratory: Negative.  Negative for cough and shortness of breath.   Cardiovascular: Negative.  Negative for palpitations.  Gastrointestinal: Positive for constipation. Negative for bloating, heartburn, nausea and vomiting.  Endocrine: Negative.   Genitourinary: Positive for dysuria, flank pain and frequency. Negative for hematuria.  Musculoskeletal: Positive for myalgias.  Neurological: Negative.   Hematological: Negative.   Psychiatric/Behavioral: The patient does not  have insomnia.   All other systems reviewed and are negative.  Objective:   Physical Exam  Constitutional: She is oriented to person, place, and time. She appears well-developed and well-nourished. No distress.  HENT:  Head: Normocephalic and atraumatic.  Right Ear: External ear normal.  Left Ear: External ear normal.  Nose: Nose normal.  Mouth/Throat: Oropharynx is clear and moist.  Eyes: Pupils are equal, round, and reactive to light.  Neck: Normal range of motion. Neck supple. No thyromegaly present.  Cardiovascular: Normal rate, regular rhythm, normal heart sounds and intact distal pulses.   No murmur heard. Pulmonary/Chest: Effort normal and breath sounds normal. No respiratory distress. She has no wheezes.  Abdominal: Soft. Bowel sounds are normal. She exhibits no distension. There is no tenderness.  Musculoskeletal: Normal range of motion. She exhibits no edema or tenderness.  Neurological: She is alert and oriented to person, place, and time. She has normal reflexes. No cranial nerve deficit.  Skin: Skin is warm and dry.  Psychiatric: She has a normal mood and affect. Her behavior is normal. Judgment and thought content normal.  Vitals reviewed.     BP 136/71   Pulse (!) 55   Temp 97.1 F (36.2 C) (Oral)   Ht '5\' 2"'  (1.575 m)   Wt 158 lb 12.8 oz (72 kg)   BMI 29.04 kg/m      Assessment & Plan:  1. Itching in the vaginal area - Urinalysis, Complete - WET PREP FOR TRICH, YEAST, CLUE - CMP14+EGFR  2. Essential hypertension - CMP14+EGFR  3. Seasonal allergic rhinitis - CMP14+EGFR  4. Constipation, unspecified constipation type - CMP14+EGFR  5. Gastroesophageal reflux disease without esophagitis - CMP14+EGFR  6. GAD (generalized anxiety disorder) - CMP14+EGFR  7. Hyperlipidemia - CMP14+EGFR - Lipid panel  8. Insomnia - CMP14+EGFR  9. Vitamin D deficiency - CMP14+EGFR - VITAMIN D 25 Hydroxy (Vit-D Deficiency, Fractures)  10. Vaginal yeast  infection - fluconazole (DIFLUCAN) 150 MG tablet; Take 1 tablet (150 mg total) by mouth every three (3) days as needed.  Dispense: 3 tablet; Refill: 0   Continue all meds Labs pending Health Maintenance reviewed Diet and exercise encouraged RTO 3 months  Evelina Dun, FNP

## 2015-10-24 NOTE — Progress Notes (Signed)
   Subjective:    Patient ID: Kaitlyn Serrano, female    DOB: 1940-08-28, 75 y.o.   MRN: [id number]  HPI    Review of Systems     Objective:   Physical Exam        Assessment & Plan:

## 2015-10-24 NOTE — Patient Instructions (Signed)

## 2015-10-25 ENCOUNTER — Telehealth: Payer: Self-pay | Admitting: Family

## 2015-10-25 LAB — CMP14+EGFR
ALBUMIN: 3.9 g/dL (ref 3.5–4.8)
ALT: 18 IU/L (ref 0–32)
AST: 19 IU/L (ref 0–40)
Albumin/Globulin Ratio: 1.5 (ref 1.2–2.2)
Alkaline Phosphatase: 84 IU/L (ref 39–117)
BUN / CREAT RATIO: 15 (ref 12–28)
BUN: 10 mg/dL (ref 8–27)
Bilirubin Total: 0.9 mg/dL (ref 0.0–1.2)
CALCIUM: 9.7 mg/dL (ref 8.7–10.3)
CO2: 29 mmol/L (ref 18–29)
CREATININE: 0.66 mg/dL (ref 0.57–1.00)
Chloride: 102 mmol/L (ref 96–106)
GFR, EST AFRICAN AMERICAN: 100 mL/min/{1.73_m2} (ref >59)
GFR, EST NON AFRICAN AMERICAN: 87 mL/min/{1.73_m2} (ref >59)
GLUCOSE: 89 mg/dL (ref 65–99)
Globulin, Total: 2.6 g/dL (ref 1.5–4.5)
Potassium: 5.2 mmol/L (ref 3.5–5.2)
Sodium: 142 mmol/L (ref 134–144)
TOTAL PROTEIN: 6.5 g/dL (ref 6.0–8.5)

## 2015-10-25 LAB — LIPID PANEL
CHOLESTEROL TOTAL: 214 mg/dL — AB (ref 100–199)
Chol/HDL Ratio: 3.5 ratio units (ref 0.0–4.4)
HDL: 61 mg/dL (ref >39)
LDL CALC: 131 mg/dL — AB (ref 0–99)
Triglycerides: 109 mg/dL (ref 0–149)
VLDL CHOLESTEROL CAL: 22 mg/dL (ref 5–40)

## 2015-10-25 LAB — VITAMIN D 25 HYDROXY (VIT D DEFICIENCY, FRACTURES): VIT D 25 HYDROXY: 33.7 ng/mL (ref 30.0–100.0)

## 2015-10-25 NOTE — Telephone Encounter (Signed)
Pt notified of results Verbalizes understanding 

## 2015-10-26 DIAGNOSIS — Z79891 Long term (current) use of opiate analgesic: Secondary | ICD-10-CM | POA: Diagnosis not present

## 2015-10-26 DIAGNOSIS — M791 Myalgia: Secondary | ICD-10-CM | POA: Diagnosis not present

## 2015-10-26 DIAGNOSIS — G894 Chronic pain syndrome: Secondary | ICD-10-CM | POA: Diagnosis not present

## 2015-10-26 DIAGNOSIS — Z79899 Other long term (current) drug therapy: Secondary | ICD-10-CM | POA: Diagnosis not present

## 2015-10-26 DIAGNOSIS — M47897 Other spondylosis, lumbosacral region: Secondary | ICD-10-CM | POA: Diagnosis not present

## 2015-10-27 ENCOUNTER — Telehealth: Payer: Self-pay | Admitting: Family

## 2015-10-27 NOTE — Telephone Encounter (Signed)
Patient aware.

## 2015-10-27 NOTE — Telephone Encounter (Signed)
We will continue to monitor. Pt needs to be on low fat diet and exercise.

## 2015-11-01 ENCOUNTER — Encounter: Payer: Self-pay | Admitting: *Deleted

## 2015-11-03 ENCOUNTER — Ambulatory Visit (INDEPENDENT_AMBULATORY_CARE_PROVIDER_SITE_OTHER): Payer: Medicare Other | Admitting: Cardiology

## 2015-11-03 ENCOUNTER — Encounter: Payer: Self-pay | Admitting: Cardiology

## 2015-11-03 VITALS — BP 145/79 | HR 55 | Ht 64.0 in | Wt 160.0 lb

## 2015-11-03 DIAGNOSIS — I10 Essential (primary) hypertension: Secondary | ICD-10-CM | POA: Diagnosis not present

## 2015-11-03 DIAGNOSIS — E785 Hyperlipidemia, unspecified: Secondary | ICD-10-CM

## 2015-11-03 DIAGNOSIS — R0789 Other chest pain: Secondary | ICD-10-CM

## 2015-11-03 DIAGNOSIS — R1310 Dysphagia, unspecified: Secondary | ICD-10-CM | POA: Diagnosis not present

## 2015-11-03 DIAGNOSIS — M7989 Other specified soft tissue disorders: Secondary | ICD-10-CM

## 2015-11-03 MED ORDER — PRAVASTATIN SODIUM 20 MG PO TABS: ORAL_TABLET | ORAL | 6 refills | Status: DC

## 2015-11-03 NOTE — Patient Instructions (Signed)
Medication Instructions:   Begin Pravastatin 20mg  daily on Monday & Friday only.  Continue all other medications.    Labwork: none  Testing/Procedures: none  Referrals:  Gastroenterology - Dr. Oneida Alar at [facility]: 2 months   Any Other Special Instructions Will Be Listed Below (If Applicable).  If you need a refill on your cardiac medications before your next appointment, please call your pharmacy.

## 2015-11-03 NOTE — Progress Notes (Addendum)
Clinical Summary Ms. Bathgate is a 75 y.o.female seen today for follow up of the following medical problems.   1. Leg swelling - echo  showed LVEF 123456, grade I diastolic dysfunction.  - improved after stopping norvasc.  - no recent swelling.    2 Chest pain - previous workup in 2012 per old [place] notes with negative myoview for ischemia. Echo showed normal LV function - 05/2014 completed exercise MPI. Low risk [facility] treadmill score of 6, no ischemia on imaging.  - can have some occasional chest tightness. Can occur at rest or with activity. Tightness midchest, 7/10. Worst with deep breathing. Lasts just a few seconds. + SOB. Occurs once a week. Started about 1 month ago. Has had increase in frequency and severity since onset. Better with aspirin. DOE at 1 block. Can have some dysphagia solids and liquids.    3. HTN - does not check regulalrly at home -compliant with meds, has not taken meds yet today however.   4. Hyperlipidemia - stopped zocor due to leg pains recently   Past Medical History:  Diagnosis Date  . Asthma   . Blood transfusion   . Blurred vision   . CAD (coronary artery disease)   . Chest pain   . CHF (congestive heart failure) ([place])   . Chronic pain syndrome   . Cough   . Cyanosis   . Diabetes ([place])   . Diverticulosis   . Double vision   . 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 Dysphagia   . Dyspnea   . Exposure to TB   . Facial pain   . Generalized weakness   . GERD (gastroesophageal reflux disease)   . GERD (gastroesophageal reflux disease)   . Glaucoma   . Hearing loss   . Heart murmur   . Hemorrhoids   . Hemorrhoids   . High cholesterol   . Hoarseness   . Hypertension   . IBS (irritable bowel syndrome)   . Insomnia   . Intervertebral disc disorder with myelopathy, lumbar region   . Irregular heart beat   . Leg swelling   . Lumbago   . Lumbar spine pain   . MI (myocardial infarction) ([place])   . Nasal congestion   . Night  sweats   . Palpitations   . Rectal bleeding   . Renal insufficiency   . Ringing in ears   . Sinus infection   . Spondylosis with myelopathy, lumbar region   . Syncope   . Valvular heart disease   . Vertigo   . Vision loss   . Visual disorder   . Wheezing      Allergies  Allergen Reactions  . Bee Venom Anaphylaxis  . Amlodipine     swelling  . Elavil [Amitriptyline Hcl] Other (See Comments)    confusion  . Penicillins Rash    To mouth     Current Outpatient Prescriptions  Medication Sig Dispense Refill  . ALPRAZolam (XANAX) 0.5 MG tablet TAKE 1/2 TABLET TWICE DAILY AS NEEDED FOR SLEEP OR ANXIETY 60 tablet 2  . atenolol (TENORMIN) 50 MG tablet TAKE ONE TABLET BY MOUTH ONCE DAILY 90 tablet 0  . cyclobenzaprine (FLEXERIL) 10 MG tablet Take 1 tablet (10 mg total) by mouth 2 (two) times daily as needed for muscle spasms. 90 tablet 1  . diltiazem (CARDIZEM CD) 120 MG 24 hr capsule Take 1 capsule (120 mg total) by mouth daily. 90 capsule 1  . EPINEPHrine 0.3  mg/0.3 mL IJ SOAJ injection Inject 0.3 mLs (0.3 mg total) into the muscle once. 2 Device 1  . Estradiol Acetate (FEMRING) 0.05 MG/24HR RING Insert ring PV every 3 months 1 each 1  . fluconazole (DIFLUCAN) 150 MG tablet Take 1 tablet (150 mg total) by mouth every three (3) days as needed. 3 tablet 0  . furosemide (LASIX) 40 MG tablet Take 1 tablet (40 mg total) by mouth daily. 90 tablet 1  . HYDROcodone-acetaminophen (NORCO) 10-325 MG tablet Take 1 tablet by mouth every 8 (eight) hours.    Marland Kitchen lactulose (CHRONULAC) 10 GM/15ML solution Take 15 mLs (10 g total) by mouth 2 (two) times daily. 240 mL 0  . pantoprazole (PROTONIX) 40 MG tablet TAKE  (1)  TABLET TWICE A DAY. 180 tablet 1  . polyethylene glycol powder (GLYCOLAX/MIRALAX) powder Take 17 g by mouth 2 (two) times daily as needed. 3350 g 11  . traZODone (DESYREL) 50 MG tablet Take 1 tablet (50 mg total) by mouth at bedtime. 90 tablet 1  . triamcinolone ointment (KENALOG) 0.5 %  Apply 1 application topically 2 (two) times daily. 30 g 0   No current facility-administered medications for this visit.      Past Surgical History:  Procedure Laterality Date  . ABDOMINAL HYSTERECTOMY    . BACK SURGERY    . BONE MARROW ASPIRATION    . CERVICAL SPINE SURGERY    . FIXATION KYPHOPLASTY    . LAMINOTOMY    . SPINAL FUSION       Allergies  Allergen Reactions  . Bee Venom Anaphylaxis  . Amlodipine     swelling  . Elavil [Amitriptyline Hcl] Other (See Comments)    confusion  . Penicillins Rash    To mouth      Family History  Problem Relation Age of Onset  . Diabetes Mother   . Heart disease Mother   . Hypertension Mother   . Gallbladder disease Mother   . Heart disease Father   . Pneumonia Sister   . Hypertension Brother   . Hyperlipidemia Brother   . Gallbladder disease    . Hypertension Child      Social History Ms. Temby reports that she quit smoking about 51 years ago. Her smoking use included Cigarettes. She has a 12.00 pack-year smoking history. She has never used smokeless tobacco. Ms. Dahlquist reports that she does not drink alcohol.   Review of Systems CONSTITUTIONAL: No weight loss, fever, chills, weakness or fatigue.  HEENT: Eyes: No visual loss, blurred vision, double vision or yellow sclerae.No hearing loss, sneezing, congestion, runny nose or sore throat.  SKIN: No rash or itching.  CARDIOVASCULAR: per HPI RESPIRATORY: per HPI  GASTROINTESTINAL: No anorexia, nausea, vomiting or diarrhea. No abdominal pain or blood.  GENITOURINARY: No burning on urination, no polyuria NEUROLOGICAL: No headache, dizziness, syncope, paralysis, ataxia, numbness or tingling in the extremities. No change in bowel or bladder control.  MUSCULOSKELETAL: No muscle, back pain, joint pain or stiffness.  LYMPHATICS: No enlarged nodes. No history of splenectomy.  PSYCHIATRIC: No history of depression or anxiety.  ENDOCRINOLOGIC: No reports of sweating, cold or  heat intolerance. No polyuria or polydipsia.  Marland Kitchen   Physical Examination Vitals:   11/03/15 1532  BP: (!) 145/79  Pulse: (!) 55   Vitals:   11/03/15 1532  Weight: 160 lb (72.6 kg)  Height: 5\' 4"  (1.626 m)    Gen: resting comfortably, no acute distress HEENT: no scleral icterus, pupils equal round and  reactive, no palptable cervical adenopathy,  CV: RRR, no m/r/g, no jvd Resp: Clear to auscultation bilaterally GI: abdomen is soft, non-tender, non-distended, normal bowel sounds, no hepatosplenomegaly MSK: extremities are warm, no edema.  Skin: warm, no rash Neuro:  no focal deficits Psych: appropriate affect   Diagnostic Studies  Echo 04/2010 [facility] LVEF 123456, grade I diastolic dysfunction, mild MR  04/2010 ExerciseMyoview No ischemia. Exercised 8 minutes, 9 METs, 95% THR.  12/2007 Renal artery [place] No stenosis, normal kidney size.   06/01/13 Clinic EKG NSR, LAD  05/2014 Exercise MPI IMPRESSION: 1. No reversible ischemia or infarction.  2. Normal left ventricular wall motion.  3. Left ventricular ejection fraction 57%  4. Low-risk stress test findings*.   08/2014 Echo Study Conclusions  - Left ventricle: The cavity size was normal. Wall thickness was normal. Systolic function was normal. The estimated ejection fraction was in the range of 60% to 65%. Wall motion was normal; there were no regional wall motion abnormalities. Doppler parameters are consistent with abnormal left ventricular relaxation (grade 1 diastolic dysfunction). - Aortic valve: Mildly calcified annulus. Trileaflet. - Mitral valve: Mildly calcified annulus. Mildly calcified leaflets . There was mild regurgitation. - Tricuspid valve: There was mild regurgitation.    Assessment and Plan  1. Leg swelling - resolved, appears to have been related to recently starting norvasc. Continue to monitor.   2. Chest pain - several year history, negative stress testing in  2012 and 2016 - fairly significant dysphagia symptoms, may be related. We will refer to GI for evaluation. No further cardiac testing at this time - EKG in clinic shows SR, no ischemic changes  3. HTN - elevated in clinic but has not taken meds yet, we will continue to monitor.   4. Hyperlipidemia - side effects on statins. We will try pravastatin 20mg  2 days a week.    F/u 2 months      Arnoldo Lenis, M.D.

## 2015-11-06 DIAGNOSIS — H02051 Trichiasis without entropian right upper eyelid: Secondary | ICD-10-CM | POA: Diagnosis not present

## 2015-11-14 ENCOUNTER — Ambulatory Visit (INDEPENDENT_AMBULATORY_CARE_PROVIDER_SITE_OTHER): Payer: Medicare Other

## 2015-11-14 DIAGNOSIS — Z23 Encounter for immunization: Secondary | ICD-10-CM | POA: Diagnosis not present

## 2015-11-23 ENCOUNTER — Other Ambulatory Visit: Payer: Self-pay | Admitting: Family Medicine

## 2015-11-23 ENCOUNTER — Ambulatory Visit (HOSPITAL_COMMUNITY)
Admission: RE | Admit: 2015-11-23 | Discharge: 2015-11-23 | Disposition: A | Discharge status: Home / Self Care | Payer: Medicare Other | Source: Ambulatory Visit | Attending: Family Medicine | Admitting: Family Medicine

## 2015-11-23 DIAGNOSIS — Z1231 Encounter for screening mammogram for malignant neoplasm of breast: Secondary | ICD-10-CM

## 2015-11-24 ENCOUNTER — Telehealth: Payer: Self-pay | Admitting: Cardiology

## 2015-11-24 DIAGNOSIS — M5106 Intervertebral disc disorders with myelopathy, lumbar region: Secondary | ICD-10-CM | POA: Diagnosis not present

## 2015-11-24 DIAGNOSIS — M545 Low back pain: Secondary | ICD-10-CM | POA: Diagnosis not present

## 2015-11-24 DIAGNOSIS — G894 Chronic pain syndrome: Secondary | ICD-10-CM | POA: Diagnosis not present

## 2015-11-24 NOTE — Telephone Encounter (Signed)
LM on daughter cell with instructions

## 2015-11-24 NOTE — Telephone Encounter (Signed)
Kaitlyn Serrano )daughter called stating that since taking the Pravastatin 20mg  daily on Monday & Friday only. Her mouth has become sore - feet and legs are still swollen.   Please call the cell # [phone]

## 2015-11-24 NOTE — Telephone Encounter (Signed)
Ok to stop pravastatin. Have her update [place] next week on symptoms   Zandra Abts MD

## 2015-12-03 ENCOUNTER — Other Ambulatory Visit: Payer: Self-pay | Admitting: Family

## 2015-12-03 DIAGNOSIS — I10 Essential (primary) hypertension: Secondary | ICD-10-CM

## 2015-12-09 ENCOUNTER — Emergency Department (HOSPITAL_COMMUNITY)
Admission: EM | Admit: 2015-12-09 | Discharge: 2015-12-09 | Disposition: A | Discharge status: Home / Self Care | Payer: Medicare Other | Attending: Emergency Medicine | Admitting: Emergency Medicine

## 2015-12-09 ENCOUNTER — Emergency Department (HOSPITAL_COMMUNITY): Payer: Medicare Other

## 2015-12-09 ENCOUNTER — Encounter (HOSPITAL_COMMUNITY): Payer: Self-pay

## 2015-12-09 DIAGNOSIS — R079 Chest pain, unspecified: Secondary | ICD-10-CM | POA: Diagnosis not present

## 2015-12-09 DIAGNOSIS — E119 Type 2 diabetes mellitus without complications: Secondary | ICD-10-CM | POA: Insufficient documentation

## 2015-12-09 DIAGNOSIS — I110 Hypertensive heart disease with heart failure: Secondary | ICD-10-CM | POA: Diagnosis not present

## 2015-12-09 DIAGNOSIS — I2510 Atherosclerotic heart disease of native coronary artery without angina pectoris: Secondary | ICD-10-CM | POA: Diagnosis not present

## 2015-12-09 DIAGNOSIS — I252 Old myocardial infarction: Secondary | ICD-10-CM | POA: Diagnosis not present

## 2015-12-09 DIAGNOSIS — R002 Palpitations: Secondary | ICD-10-CM | POA: Insufficient documentation

## 2015-12-09 DIAGNOSIS — I509 Heart failure, unspecified: Secondary | ICD-10-CM | POA: Insufficient documentation

## 2015-12-09 DIAGNOSIS — Z79899 Other long term (current) drug therapy: Secondary | ICD-10-CM | POA: Diagnosis not present

## 2015-12-09 DIAGNOSIS — Z87891 Personal history of nicotine dependence: Secondary | ICD-10-CM | POA: Diagnosis not present

## 2015-12-09 HISTORY — DX: Personal history of other medical treatment: Z92.89

## 2015-12-09 LAB — BASIC METABOLIC PANEL
Anion gap: 8 (ref 5–15)
BUN: 12 mg/dL (ref 6–20)
CALCIUM: 9.3 mg/dL (ref 8.9–10.3)
CHLORIDE: 104 mmol/L (ref 101–111)
CO2: 26 mmol/L (ref 22–32)
CREATININE: 0.62 mg/dL (ref 0.44–1.00)
GFR calc non Af Amer: >60 mL/min (ref >60)
GLUCOSE: 82 mg/dL (ref 65–99)
Potassium: 3.3 mmol/L — ABNORMAL LOW (ref 3.5–5.1)
Sodium: 138 mmol/L (ref 135–145)

## 2015-12-09 LAB — CBC WITH DIFFERENTIAL/PLATELET
BASOS PCT: 1 %
Basophils Absolute: 0.1 10*3/uL (ref 0.0–0.1)
Eosinophils Absolute: 0.2 10*3/uL (ref 0.0–0.7)
Eosinophils Relative: 2 %
HEMATOCRIT: 44.3 % (ref 36.0–46.0)
Hemoglobin: 15.6 g/dL — ABNORMAL HIGH (ref 12.0–15.0)
LYMPHS ABS: 2 10*3/uL (ref 0.7–4.0)
Lymphocytes Relative: 21 %
MCH: 31.9 pg (ref 26.0–34.0)
MCHC: 35.2 g/dL (ref 30.0–36.0)
MCV: 90.6 fL (ref 78.0–100.0)
MONO ABS: 1 10*3/uL (ref 0.1–1.0)
MONOS PCT: 10 %
NEUTROS ABS: 6.1 10*3/uL (ref 1.7–7.7)
Neutrophils Relative %: 66 %
Platelets: 259 10*3/uL (ref 150–400)
RBC: 4.89 MIL/uL (ref 3.87–5.11)
RDW: 12.5 % (ref 11.5–15.5)
WBC: 9.3 10*3/uL (ref 4.0–10.5)

## 2015-12-09 LAB — TROPONIN I
Troponin I: <0.03 ng/mL (ref <0.03)
Troponin I: <0.03 ng/mL (ref <0.03)

## 2015-12-09 LAB — D-DIMER, QUANTITATIVE (NOT AT ARMC): D DIMER QUANT: 0.51 ug{FEU}/mL — AB (ref 0.00–0.50)

## 2015-12-09 MED ORDER — POTASSIUM CHLORIDE CRYS ER 20 MEQ PO TBCR
40.0000 meq | EXTENDED_RELEASE_TABLET | Freq: Once | ORAL | Status: AC
  Administered 2015-12-09: 40 meq via ORAL
  Filled 2015-12-09: qty 2

## 2015-12-09 MED ORDER — LORAZEPAM 0.5 MG PO TABS
0.2500 mg | ORAL_TABLET | Freq: Once | ORAL | Status: AC
  Administered 2015-12-09: 0.25 mg via ORAL
  Filled 2015-12-09: qty 1

## 2015-12-09 MED ORDER — GI COCKTAIL ~~LOC~~
30.0000 mL | Freq: Once | ORAL | Status: AC
  Administered 2015-12-09: 30 mL via ORAL
  Filled 2015-12-09: qty 30

## 2015-12-09 MED ORDER — IOPAMIDOL (ISOVUE-370) INJECTION 76%
100.0000 mL | Freq: Once | INTRAVENOUS | Status: AC | PRN
Start: 2015-12-09 — End: 2015-12-09
  Administered 2015-12-09: 100 mL via INTRAVENOUS

## 2015-12-09 NOTE — ED Triage Notes (Signed)
Pt reports chest pain, htn, tachycardia, and sob since last night.

## 2015-12-09 NOTE — ED Provider Notes (Signed)
[facility] DEPT Provider Note   CSN: [id number] Arrival date & time: 12/09/15  1728     History   Chief Complaint Chief Complaint  Patient presents with  . Chest Pain    HPI Kaitlyn Serrano is a 75 y.o. female.  HPI  Pt was seen at 1750. Per pt, c/o gradual onset and persistence of constant mid-sternal chest "pain" that began last night approximately 2300. Describes the CP as "burning" in her mid-chest, and associated with palpitations. Pt describes the palpitations as "my heart is racing out of my chest." Pt states she "didn't get much sleep" due to her symptoms. Pt states she got out of bed at 0600 this morning with these symptoms, which have continued constantly all day. Pt states she "did a lot of running around shopping" without change in her symptoms. Nothing improves her symptoms (ASA, resting). Pt states she took her blood pressure at home this afternoon (wrist BP machine) and "it was high." Denies N/V/D, no abd pain, no back pain, no SOB/cough, no fevers.     Past Medical History:  Diagnosis Date  . Asthma   . Blood transfusion   . Blurred vision   . CAD (coronary artery disease)   . Chest pain   . CHF (congestive heart failure) ([place])   . Chronic pain syndrome   . Cough   . Cyanosis   . Diabetes ([place])   . Diverticulosis   . Double vision   . 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 Dysphagia   . Dyspnea   . Exposure to TB   . Facial pain   . Generalized weakness   . GERD (gastroesophageal reflux disease)   . GERD (gastroesophageal reflux disease)   . Glaucoma   . Hearing loss   . Heart murmur   . Hemorrhoids   . Hemorrhoids   . High cholesterol   . Hoarseness   . Hx of cardiovascular stress test    negative 2012 and 2016  . Hypertension   . IBS (irritable bowel syndrome)   . Insomnia   . Intervertebral disc disorder with myelopathy, lumbar region   . Irregular heart beat   . Leg swelling   . Lumbago   . Lumbar spine pain   . MI (myocardial  infarction)   . Nasal congestion   . Night sweats   . Palpitations   . Rectal bleeding   . Renal insufficiency   . Ringing in ears   . Sinus infection   . Spondylosis with myelopathy, lumbar region   . Syncope   . Valvular heart disease   . Vertigo   . Vision loss   . Visual disorder   . Wheezing     Patient Active Problem List   Diagnosis Date Noted  . Constipation 01/16/2015  . Insomnia 01/16/2015  . GAD (generalized anxiety disorder) 10/08/2013  . Vitamin D deficiency 10/08/2013  . GERD (gastroesophageal reflux disease) 10/08/2013  . Hypertension 08/13/2012  . Hyperlipidemia 08/13/2012  . Seasonal allergic rhinitis 06/30/2012  . Hemorrhoids 06/25/2011    Past Surgical History:  Procedure Laterality Date  . ABDOMINAL HYSTERECTOMY    . BACK SURGERY    . BONE MARROW ASPIRATION    . CERVICAL SPINE SURGERY    . FIXATION KYPHOPLASTY    . LAMINOTOMY    . SPINAL FUSION      OB History    Gravida Para Term Preterm AB Living   4 3 3  0 1  SAB TAB Ectopic Multiple Live Births   0 0 1           Home Medications    Prior to Admission medications   Medication Sig Start Date End Date Taking? Authorizing Provider  ALPRAZolam Duanne Moron) 0.5 MG tablet TAKE 1/2 TABLET TWICE DAILY AS NEEDED FOR SLEEP OR ANXIETY 04/27/15   Sharion Balloon, FNP  atenolol (TENORMIN) 50 MG tablet TAKE ONE TABLET BY MOUTH ONCE DAILY 09/11/15   Christy A Hawks, FNP  CARTIA XT 120 MG 24 hr capsule TAKE ONE  BY MOUTH ONCE DAILY 12/04/15   Sharion Balloon, FNP  cyclobenzaprine (FLEXERIL) 10 MG tablet Take 1 tablet (10 mg total) by mouth 2 (two) times daily as needed for muscle spasms. 04/27/15   Sharion Balloon, FNP  EPINEPHrine 0.3 mg/0.3 mL IJ SOAJ injection Inject 0.3 mLs (0.3 mg total) into the muscle once. 10/06/13   Lysbeth Penner, FNP  Estradiol Acetate (FEMRING) 0.05 MG/24HR RING Insert ring PV every 3 months 04/27/15   Sharion Balloon, FNP  fluconazole (DIFLUCAN) 150 MG tablet Take 1 tablet (150 mg  total) by mouth every three (3) days as needed. 10/24/15   Sharion Balloon, FNP  furosemide (LASIX) 40 MG tablet Take 1 tablet (40 mg total) by mouth daily. 04/27/15   Sharion Balloon, FNP  HYDROcodone-acetaminophen (NORCO) 10-325 MG tablet Take 1 tablet by mouth every 8 (eight) hours.    Historical Provider, MD  lactulose (CHRONULAC) 10 GM/15ML solution Take 15 mLs (10 g total) by mouth 2 (two) times daily. 04/27/15   Sharion Balloon, FNP  pantoprazole (PROTONIX) 40 MG tablet TAKE  (1)  TABLET TWICE A DAY. 04/27/15   Sharion Balloon, FNP  polyethylene glycol powder (GLYCOLAX/MIRALAX) powder Take 17 g by mouth 2 (two) times daily as needed. 12/08/13   Sharion Balloon, FNP  traZODone (DESYREL) 50 MG tablet Take 1 tablet (50 mg total) by mouth at bedtime. 04/27/15   Sharion Balloon, FNP  triamcinolone ointment (KENALOG) 0.5 % Apply 1 application topically 2 (two) times daily. 08/07/15   Sharion Balloon, FNP    Family History Family History  Problem Relation Age of Onset  . Diabetes Mother   . Heart disease Mother   . Hypertension Mother   . Gallbladder disease Mother   . Heart disease Father   . Pneumonia Sister   . Hypertension Brother   . Hyperlipidemia Brother   . Gallbladder disease    . Hypertension Child     Social History Social History  Substance Use Topics  . Smoking status: Former Smoker    Packs/day: 4.00    Years: 3.00    Types: Cigarettes    Quit date: 02/20/1964  . Smokeless tobacco: Never Used  . Alcohol use No     Allergies   Bee venom; Amlodipine; Elavil [amitriptyline hcl]; and Penicillins   Review of Systems Review of Systems ROS: Statement: All systems negative except as marked or noted in the HPI; Constitutional: Negative for fever and chills. ; ; Eyes: Negative for eye pain, redness and discharge. ; ; ENMT: Negative for ear pain, hoarseness, nasal congestion, sinus pressure and sore throat. ; ; Cardiovascular: +CP, palpitations. Negative for diaphoresis, dyspnea  and peripheral edema. ; ; Respiratory: Negative for cough, wheezing and stridor. ; ; Gastrointestinal: Negative for nausea, vomiting, diarrhea, abdominal pain, blood in stool, hematemesis, jaundice and rectal bleeding. . ; ; Genitourinary: Negative for dysuria, flank pain and  hematuria. ; ; Musculoskeletal: Negative for back pain and neck pain. Negative for swelling and trauma.; ; Skin: Negative for pruritus, rash, abrasions, blisters, bruising and skin lesion.; ; Neuro: Negative for headache, lightheadedness and neck stiffness. Negative for weakness, altered level of consciousness, altered mental status, extremity weakness, paresthesias, involuntary movement, seizure and syncope.       Physical Exam Updated Vital Signs BP 189/74 (BP Location: Left Arm)   Pulse 67   Temp 97.6 F (36.4 C) (Oral)   Resp 18   Ht 5\' 3"  (1.6 m)   Wt 145 lb (65.8 kg)   SpO2 98%   BMI 25.69 kg/m    BP 155/73 (BP Location: Left Arm)   Pulse (!) 55   Temp 97.6 F (36.4 C) (Oral)   Resp 16   Ht 5\' 3"  (1.6 m)   Wt 145 lb (65.8 kg)   SpO2 94%   BMI 25.69 kg/m    Physical Exam 1755: Physical examination:  Nursing notes reviewed; Vital signs and O2 SAT reviewed;  Constitutional: Well developed, Well nourished, Well hydrated, In no acute distress; Head:  Normocephalic, atraumatic; Eyes: EOMI, PERRL, No scleral icterus; ENMT: TM's clear bilat. +edemetous nasal turbinates bilat with clear rhinorrhea.  Mouth and pharynx normal, Mucous membranes moist; Neck: Supple, Full range of motion, No lymphadenopathy; Cardiovascular: Regular rate and rhythm, No gallop; Respiratory: Breath sounds clear & equal bilaterally, No wheezes.  Speaking full sentences with ease, Normal respiratory effort/excursion; Chest: Nontender, Movement normal; Abdomen: Soft, Nontender, Nondistended, Normal bowel sounds; Genitourinary: No CVA tenderness; Extremities: Pulses normal, No tenderness, No edema, No calf edema or asymmetry.; Neuro: AA&Ox3,  Major CN grossly intact.  Speech clear. No gross focal motor or sensory deficits in extremities.; Skin: Color normal, Warm, Dry.; Psych:  Rapid speech.     ED Treatments / Results  Labs (all labs ordered are listed, but only abnormal results are displayed)   EKG  EKG Interpretation  Date/Time:  Saturday December 09 2015 17:35:59 EDT Ventricular Rate:  63 PR Interval:    QRS Duration: 99 QT Interval:  408 QTC Calculation: 418 R Axis:   -28 Text Interpretation:  Sinus rhythm Left axis deviation Probable left ventricular hypertrophy Anterior Q waves, possibly due to LVH Baseline wander When compared with ECG of 04/17/2014 No significant change was found Confirmed by [facility]  MD, Nunzio Cory [phone]) on 12/09/2015 6:37:18 PM       Radiology   Procedures Procedures (including critical care time)  Medications Ordered in ED Medications  gi cocktail (Maalox,Lidocaine,Donnatal) (not administered)  LORazepam (ATIVAN) tablet 0.25 mg (not administered)     Initial Impression / Assessment and Plan / ED Course  I have reviewed the triage vital signs and the nursing notes.  Pertinent labs & imaging results that were available during my care of the patient were reviewed by me and considered in my medical decision making (see chart for details).  MDM Reviewed: previous chart, nursing note and vitals Reviewed previous: labs and ECG Interpretation: labs, ECG, x-ray and CT scan   Results for orders placed or performed during the hospital encounter of 12/09/15  CBC with Differential  Result Value Ref Range   WBC 9.3 4.0 - 10.5 K/uL   RBC 4.89 3.87 - 5.11 MIL/uL   Hemoglobin 15.6 (H) 12.0 - 15.0 g/dL   HCT 44.3 36.0 - 46.0 %   MCV 90.6 78.0 - 100.0 fL   MCH 31.9 26.0 - 34.0 pg   MCHC 35.2 30.0 - 36.0  g/dL   RDW 12.5 11.5 - 15.5 %   Platelets 259 150 - 400 K/uL   Neutrophils Relative % 66 %   Neutro Abs 6.1 1.7 - 7.7 K/uL   Lymphocytes Relative 21 %   Lymphs Abs 2.0 0.7 - 4.0 K/uL     Monocytes Relative 10 %   Monocytes Absolute 1.0 0.1 - 1.0 K/uL   Eosinophils Relative 2 %   Eosinophils Absolute 0.2 0.0 - 0.7 K/uL   Basophils Relative 1 %   Basophils Absolute 0.1 0.0 - 0.1 K/uL  Basic metabolic panel  Result Value Ref Range   Sodium 138 135 - 145 mmol/L   Potassium 3.3 (L) 3.5 - 5.1 mmol/L   Chloride 104 101 - 111 mmol/L   CO2 26 22 - 32 mmol/L   Glucose, Bld 82 65 - 99 mg/dL   BUN 12 6 - 20 mg/dL   Creatinine, Ser 0.62 0.44 - 1.00 mg/dL   Calcium 9.3 8.9 - 10.3 mg/dL   GFR calc non Af Amer >60 >60 mL/min   GFR calc Af Amer >60 >60 mL/min   Anion gap 8 5 - 15  Troponin I  Result Value Ref Range   Troponin I <0.03 <0.03 ng/mL  D-dimer, quantitative  Result Value Ref Range   D-Dimer, Quant 0.51 (H) 0.00 - 0.50 ug/mL-FEU  Troponin I  Result Value Ref Range   Troponin I <0.03 <0.03 ng/mL   Dg Chest 2 View Result Date: 12/09/2015 CLINICAL DATA:  Chest pain and irregular heartbeat today EXAM: CHEST  2 VIEW COMPARISON:  Chest x-ray dated 11/24/2014. FINDINGS: Heart size and mediastinal contours are normal. Atherosclerotic changes again noted at the aortic arch. Lungs are clear. Lung volumes are normal. No pleural effusion or pneumothorax seen. Stable appearance of the postsurgical hardware within the cervical and thoracolumbar spine. No acute or suspicious osseous finding. IMPRESSION: No active cardiopulmonary disease. No evidence of pneumonia or pulmonary edema. Electronically Signed   By: Franki Cabot M.D.   On: 12/09/2015 18:27   Ct Angio Chest Pe W/cm &/or Wo Cm Result Date: 12/09/2015 CLINICAL DATA:  Chest pain and palpitations. EXAM: CT ANGIOGRAPHY CHEST WITH CONTRAST TECHNIQUE: Multidetector CT imaging of the chest was performed using the standard protocol during bolus administration of intravenous contrast. Multiplanar CT image reconstructions and MIPs were obtained to evaluate the vascular anatomy. CONTRAST:  100 cc Isovue 370 intravenous COMPARISON:   None. FINDINGS: Cardiovascular: Satisfactory opacification the pulmonary arteries, but intermittent motion degradation. No evidence of pulmonary embolism to the segmental level at least. Normal heart size. No pericardial effusion. Scattered atherosclerotic calcification. No evidence of acute aortic syndrome. Mediastinum/Nodes: Ingested high-density material which is partially retained in the lower esophagus. Chronic calcified left hilar lymph nodes. No adenopathy or mass. Lungs/Pleura: There is no edema, consolidation, effusion, or pneumothorax. Upper Abdomen: No acute finding Musculoskeletal: Extensive lumbar and lower thoracic fixation with advanced T9-10 adjacent segment disc degeneration. Partly seen ACDF hardware. Review of the MIP images confirms the above findings. IMPRESSION: 1. No evidence of pulmonary embolism or other acute finding. 2. Motion degraded exam. Electronically Signed   By: Monte Fantasia M.D.   On: 12/09/2015 19:52     1755:  During my exam, pt continues to state that her "heart is racing out of my chest" and is "beating too fast" and "doing flip-flops." Pt's monitor remained NSR, rate 60's, during my entire HPI. Pt and family reassured. Pt insistent "no it's not." Family attempted to reassure pt.  Pt has already taken ASA PTA. Will tx symptomatically as workup progresses.   2145:  Doubt PE as cause for symptoms with negative C-A chest.  Doubt ACS as cause for symptoms with normal troponin x2 and unchanged EKG from previous after nearly 24 hours of constant symptoms. Feels better after meds and wants to go home now. Dx and testing d/w pt and family.  Questions answered.  Verb understanding, agreeable to d/c home with outpt f/u.      Final Clinical Impressions(s) / ED Diagnoses   Final diagnoses:  None    New Prescriptions New Prescriptions   No medications on file     Francine Graven, DO 12/12/15 1950

## 2015-12-09 NOTE — Discharge Instructions (Signed)
Avoid avoid caffinated products, such as teas, colas, coffee, chocolate. Avoid over the counter cold medicines, herbal or "natural vitamin" products, and illicit drugs because they can contain stimulants.  Call your regular medical doctor and your Cardiologist on Monday to schedule a follow up appointment in the next 3 days.  Return to the Emergency Department immediately if worsening.

## 2015-12-21 DIAGNOSIS — G894 Chronic pain syndrome: Secondary | ICD-10-CM | POA: Diagnosis not present

## 2015-12-21 DIAGNOSIS — M5106 Intervertebral disc disorders with myelopathy, lumbar region: Secondary | ICD-10-CM | POA: Diagnosis not present

## 2015-12-21 DIAGNOSIS — M7061 Trochanteric bursitis, right hip: Secondary | ICD-10-CM | POA: Diagnosis not present

## 2015-12-24 ENCOUNTER — Other Ambulatory Visit: Payer: Self-pay | Admitting: Family

## 2015-12-25 ENCOUNTER — Other Ambulatory Visit: Payer: Self-pay | Admitting: *Deleted

## 2015-12-25 MED ORDER — ATENOLOL 50 MG PO TABS: 50.0000 mg | ORAL_TABLET | Freq: Every day | ORAL | 0 refills | Status: DC

## 2015-12-29 ENCOUNTER — Other Ambulatory Visit: Payer: Self-pay | Admitting: Family

## 2016-01-05 ENCOUNTER — Telehealth: Payer: Self-pay | Admitting: Family

## 2016-01-05 NOTE — Telephone Encounter (Signed)
NTBS.

## 2016-01-05 NOTE — Telephone Encounter (Signed)
Left message, call for appointment.

## 2016-01-11 ENCOUNTER — Telehealth: Payer: Self-pay | Admitting: Family

## 2016-01-11 ENCOUNTER — Telehealth: Payer: Self-pay

## 2016-01-11 DIAGNOSIS — R1010 Upper abdominal pain, unspecified: Secondary | ICD-10-CM

## 2016-01-11 NOTE — Telephone Encounter (Signed)
Upper GI and rib pain  Went to hosp 1 month ago and they wanted her to follow up with GI - she never went -- can we refer her   Wants to see Dr Ardis Hughs

## 2016-01-12 ENCOUNTER — Encounter: Payer: Self-pay | Admitting: Physician Assistant

## 2016-01-12 NOTE — Telephone Encounter (Signed)
GI referral placed

## 2016-01-19 ENCOUNTER — Other Ambulatory Visit: Payer: Self-pay | Admitting: Family

## 2016-01-22 ENCOUNTER — Ambulatory Visit (INDEPENDENT_AMBULATORY_CARE_PROVIDER_SITE_OTHER): Payer: Medicare Other | Admitting: Family

## 2016-01-22 ENCOUNTER — Encounter (INDEPENDENT_AMBULATORY_CARE_PROVIDER_SITE_OTHER): Payer: Self-pay

## 2016-01-22 ENCOUNTER — Encounter: Payer: Self-pay | Admitting: Family

## 2016-01-22 VITALS — BP 152/88 | HR 58 | Temp 97.0°F | Ht 63.0 in | Wt 158.4 lb

## 2016-01-22 DIAGNOSIS — J0100 Acute maxillary sinusitis, unspecified: Secondary | ICD-10-CM | POA: Diagnosis not present

## 2016-01-22 MED ORDER — DOXYCYCLINE HYCLATE 100 MG PO TABS: 100.0000 mg | ORAL_TABLET | Freq: Two times a day (BID) | ORAL | 0 refills | Status: DC

## 2016-01-22 NOTE — Progress Notes (Signed)
   Subjective:    Patient ID: Kaitlyn Serrano, female    DOB: 1940/08/11, 75 y.o.   MRN: [id number]  Cough  This is a new problem. Associated symptoms include chills, ear pain, headaches and a sore throat.  Sinusitis  This is a new problem. The current episode started in the past 7 days. The problem has been gradually worsening since onset. There has been no fever. Her pain is at a severity of 10/10. The pain is mild. Associated symptoms include chills, congestion, coughing, ear pain, headaches, a hoarse voice, sinus pressure, sneezing and a sore throat. Pertinent negatives include no swollen glands. Past treatments include acetaminophen, lying down and oral decongestants. The treatment provided mild relief.      Review of Systems  Constitutional: Positive for chills.  HENT: Positive for congestion, ear pain, hoarse voice, sinus pressure, sneezing and sore throat.   Respiratory: Positive for cough.   Neurological: Positive for headaches.  All other systems reviewed and are negative.      Objective:   Physical Exam  Constitutional: She is oriented to person, place, and time. She appears well-developed and well-nourished. No distress.  HENT:  Head: Normocephalic and atraumatic.  Right Ear: External ear normal.  Left Ear: External ear normal.  Nose: Mucosal edema and rhinorrhea present. Right sinus exhibits maxillary sinus tenderness. Right sinus exhibits no frontal sinus tenderness. Left sinus exhibits maxillary sinus tenderness.  Mouth/Throat: Posterior oropharyngeal erythema present.  Eyes: Pupils are equal, round, and reactive to light.  Neck: Normal range of motion. Neck supple. No thyromegaly present.  Cardiovascular: Normal rate, regular rhythm, normal heart sounds and intact distal pulses.   No murmur heard. Pulmonary/Chest: Effort normal and breath sounds normal. No respiratory distress. She has no wheezes.  Intermittent nonproductive cough   Abdominal: Soft. Bowel sounds  are normal. She exhibits no distension. There is no tenderness.  Musculoskeletal: Normal range of motion. She exhibits no edema or tenderness.  Neurological: She is alert and oriented to person, place, and time. She has normal reflexes. No cranial nerve deficit.  Skin: Skin is warm and dry.  Psychiatric: She has a normal mood and affect. Her behavior is normal. Judgment and thought content normal.  Vitals reviewed.     BP (!) 152/88   Pulse (!) 58   Temp 97 F (36.1 C) (Oral)   Ht 5\' 3"  (1.6 m)   Wt 158 lb 6.4 oz (71.8 kg)   BMI 28.06 kg/m      Assessment & Plan:  1. Acute maxillary sinusitis, recurrence not specified -- Take meds as prescribed - Use a cool mist humidifier  -Use saline nose sprays frequently -Saline irrigations of the nose can be very helpful if done frequently.  * 4X daily for 1 week*  * Use of a nettie pot can be helpful with this. Follow directions with this* -Force fluids -For any cough or congestion  Use plain Mucinex- regular strength or max strength is fine   * Children- consult with Pharmacist for dosing -For fever or aces or pains- take tylenol or ibuprofen appropriate for age and weight.  * for fevers greater than 101 orally you may alternate ibuprofen and tylenol every  3 hours. -Throat lozenges if help - doxycycline (VIBRA-TABS) 100 MG tablet; Take 1 tablet (100 mg total) by mouth 2 (two) times daily.  Dispense: 20 tablet; Refill: 0  Evelina Dun, FNP

## 2016-01-22 NOTE — Telephone Encounter (Signed)
rx called into pharmacy

## 2016-01-22 NOTE — Patient Instructions (Signed)

## 2016-01-23 ENCOUNTER — Telehealth: Payer: Self-pay | Admitting: Family

## 2016-01-23 ENCOUNTER — Ambulatory Visit: Payer: Self-pay | Admitting: Cardiology

## 2016-01-23 MED ORDER — LEVOFLOXACIN 500 MG PO TABS: 500.0000 mg | ORAL_TABLET | Freq: Every day | ORAL | 0 refills | Status: DC

## 2016-01-23 NOTE — Telephone Encounter (Signed)
Pt aware.

## 2016-01-23 NOTE — Telephone Encounter (Signed)
Levaquin Prescription sent to pharmacy

## 2016-01-25 ENCOUNTER — Ambulatory Visit: Payer: Self-pay | Admitting: Gastroenterology

## 2016-01-25 DIAGNOSIS — M545 Low back pain: Secondary | ICD-10-CM | POA: Diagnosis not present

## 2016-01-25 DIAGNOSIS — M5106 Intervertebral disc disorders with myelopathy, lumbar region: Secondary | ICD-10-CM | POA: Diagnosis not present

## 2016-01-25 DIAGNOSIS — M791 Myalgia: Secondary | ICD-10-CM | POA: Diagnosis not present

## 2016-01-25 DIAGNOSIS — G894 Chronic pain syndrome: Secondary | ICD-10-CM | POA: Diagnosis not present

## 2016-01-29 ENCOUNTER — Ambulatory Visit: Payer: Self-pay | Admitting: Physician Assistant

## 2016-01-31 ENCOUNTER — Ambulatory Visit (INDEPENDENT_AMBULATORY_CARE_PROVIDER_SITE_OTHER): Payer: Medicare Other | Admitting: Physician Assistant

## 2016-01-31 ENCOUNTER — Other Ambulatory Visit (INDEPENDENT_AMBULATORY_CARE_PROVIDER_SITE_OTHER): Payer: Medicare Other

## 2016-01-31 ENCOUNTER — Encounter: Payer: Self-pay | Admitting: Physician Assistant

## 2016-01-31 VITALS — BP 140/80 | HR 68 | Ht 63.0 in | Wt 160.4 lb

## 2016-01-31 DIAGNOSIS — R1011 Right upper quadrant pain: Secondary | ICD-10-CM

## 2016-01-31 DIAGNOSIS — R1084 Generalized abdominal pain: Secondary | ICD-10-CM | POA: Diagnosis not present

## 2016-01-31 DIAGNOSIS — R1319 Other dysphagia: Secondary | ICD-10-CM | POA: Diagnosis not present

## 2016-01-31 DIAGNOSIS — R1012 Left upper quadrant pain: Secondary | ICD-10-CM

## 2016-01-31 LAB — BASIC METABOLIC PANEL WITH GFR
BUN: 16 mg/dL (ref 6–23)
CO2: 33 meq/L — ABNORMAL HIGH (ref 19–32)
Calcium: 9.5 mg/dL (ref 8.4–10.5)
Chloride: 100 meq/L (ref 96–112)
Creatinine, Ser: 0.91 mg/dL (ref 0.40–1.20)
GFR: 63.94 mL/min
Glucose, Bld: 101 mg/dL — ABNORMAL HIGH (ref 70–99)
Potassium: 3.7 meq/L (ref 3.5–5.1)
Sodium: 139 meq/L (ref 135–145)

## 2016-01-31 LAB — SEDIMENTATION RATE: Sed Rate: 6 mm/h (ref 0–30)

## 2016-01-31 MED ORDER — DICYCLOMINE HCL 10 MG PO CAPS: ORAL_CAPSULE | ORAL | 6 refills | Status: DC

## 2016-01-31 MED ORDER — NA SULFATE-K SULFATE-MG SULF 17.5-3.13-1.6 GM/177ML PO SOLN: 1.0000 | Freq: Once | ORAL | 0 refills | Status: AC

## 2016-01-31 NOTE — Progress Notes (Signed)
Subjective:    Patient ID: Kaitlyn Serrano, female    DOB: 03-09-1940, 75 y.o.   MRN: [id number]  HPI Kaitlyn Serrano is a pleasant 75 year old white female known to Dr. Fuller Plan. She is referred today by Sherre Scarlet NP/[facility] for evaluation of upper abdominal pain. She has history of chronic GERD, and diverticulosis. She had colonoscopy done by Dr. Watt Climes September 2013 with finding of external and internal hemorrhoids there were no diverticuli commented on. She had CT of the abdomen and pelvis done in 2016 with finding of extensive left and sigmoid diverticulosis. She was treated for diverticulitis at that time. Patient states that she's been having ongoing upper abdominal pain over the past 4-5 months him what progressive. She says it's present every day off and on and seems to be worse after eating most of the time. She also gets severe abdominal cramping at times houses her to break out in sweats prior to bowel movements. Sometimes the cramping is so bad she feels like she may pass out. He is having episodes of diarrhea alternating with mild constipation. She has not noticed any melena or hematochezia. She has had nausea intermittently as well appetite decreased and is down a few pounds.'s line She's not been on any regular NSAIDs. She also has chronic GERD and has intermittent solid food dysphagia.'s line She is taking Protonix 40 mg by mouth twice daily. She's not been taking any laxative on a regular basis.  Review of Systems Pertinent positive and negative review of systems were noted in the above HPI section.  All other review of systems was otherwise negative.  Outpatient Encounter Prescriptions as of 01/31/2016  Medication Sig  . ALPRAZolam (XANAX) 0.5 MG tablet TAKE ONE-HALF TABLET BY MOUTH TWICE DAILY AS NEEDED FOR ANXIETY /SLEEP  . atenolol (TENORMIN) 50 MG tablet Take 1 tablet (50 mg total) by mouth daily.  Marland Kitchen CARTIA XT 120 MG 24 hr capsule TAKE ONE  BY MOUTH ONCE DAILY    . cyclobenzaprine (FLEXERIL) 10 MG tablet Take 1 tablet (10 mg total) by mouth 2 (two) times daily as needed for muscle spasms.  Marland Kitchen doxycycline (VIBRA-TABS) 100 MG tablet Take 1 tablet (100 mg total) by mouth 2 (two) times daily.  Marland Kitchen EPINEPHrine 0.3 mg/0.3 mL IJ SOAJ injection Inject 0.3 mLs (0.3 mg total) into the muscle once.  . Estradiol Acetate (FEMRING) 0.05 MG/24HR RING Insert ring PV every 3 months (Patient taking differently: Place 1 each vaginally every 3 (three) months. Insert ring every 3 months)  . furosemide (LASIX) 40 MG tablet TAKE ONE TABLET BY MOUTH ONCE DAILY  . HYDROcodone-acetaminophen (NORCO) 10-325 MG tablet Take 1 tablet by mouth every 8 (eight) hours as needed for moderate pain or severe pain.   Marland Kitchen lactulose (CHRONULAC) 10 GM/15ML solution Take 15 mLs (10 g total) by mouth 2 (two) times daily.  Marland Kitchen levofloxacin (LEVAQUIN) 500 MG tablet Take 1 tablet (500 mg total) by mouth daily.  . pantoprazole (PROTONIX) 40 MG tablet TAKE  (1)  TABLET TWICE A DAY. (Patient taking differently: Take 40 mg by mouth 2 (two) times daily as needed. )  . polyethylene glycol powder (GLYCOLAX/MIRALAX) powder Take 17 g by mouth 2 (two) times daily as needed. (Patient taking differently: Take 17 g by mouth 2 (two) times daily as needed for mild constipation or moderate constipation. )  . traZODone (DESYREL) 50 MG tablet TAKE ONE TABLET BY MOUTH AT BEDTIME  . triamcinolone ointment (KENALOG) 0.5 % Apply 1  application topically 2 (two) times daily. (Patient taking differently: Apply 1 application topically daily as needed. )  . VOLTAREN 1 % GEL   . dicyclomine (BENTYL) 10 MG capsule Take 1 tab twice daily.  . Na Sulfate-K Sulfate-Mg Sulf 17.5-3.13-1.6 GM/180ML SOLN Take 1 kit by mouth once.  . Na Sulfate-K Sulfate-Mg Sulf 17.5-3.13-1.6 GM/180ML SOLN Take 1 kit by mouth once.   No facility-administered encounter medications on file as of 01/31/2016.    Allergies  Allergen Reactions  . Bee Venom  Anaphylaxis  . Amlodipine     swelling  . Elavil [Amitriptyline Hcl] Other (See Comments)    confusion  . Statins Swelling and Other (See Comments)    Peeling of skin  . Penicillins Rash    To mouth   Patient Active Problem List   Diagnosis Date Noted  . Constipation 01/16/2015  . Insomnia 01/16/2015  . GAD (generalized anxiety disorder) 10/08/2013  . Vitamin D deficiency 10/08/2013  . GERD (gastroesophageal reflux disease) 10/08/2013  . Hypertension 08/13/2012  . Hyperlipidemia 08/13/2012  . Seasonal allergic rhinitis 06/30/2012  . Hemorrhoids 06/25/2011   Social History   Social History  . Marital status: Married    Spouse name: N/A  . Number of children: N/A  . Years of education: N/A   Occupational History  . Not on file.   Social History Main Topics  . Smoking status: Former Smoker    Packs/day: 4.00    Years: 3.00    Types: Cigarettes    Quit date: 02/20/1964  . Smokeless tobacco: Never Used  . Alcohol use No  . Drug use: No  . Sexual activity: Yes    Birth control/ protection: None   Other Topics Concern  . Not on file   Social History Narrative   ** Merged History Encounter **        Ms. Braaten family history includes Diabetes in her mother; Gallbladder disease in her mother; Heart disease in her father and mother; Hyperlipidemia in her brother; Hypertension in her brother, child, and mother; Pneumonia in her sister.      Objective:    Vitals:   01/31/16 0901  BP: 140/80  Pulse: 68    Physical Exam  well-developed older white female in no acute distress, pleasant blood pressure 140/80 pulse 68, I 5 foot 3 weight 160, BMI 28.4. HEENT; nontraumatic normocephalic EOMI PERRLA sclera anicteric, Cardiovascular regular rate and rhythm with S1-S2 no murmur or gallop, Pulmonary ;clear bilaterally, Abdomen ;soft the upper abdomen and into the right upper right mid quadrant, there is no palpable mass or hepatosplenomegaly bowel sounds are present on the  Rectal; exam scant stool Hemoccult negative, Ext; no clubbing cyanosis or edema kin warm and dry, Neuropsych; mood and affect appropriate       Assessment & Plan:   #48 75 year old white female with 4-5 month history of persistent upper abdominal pain and intermittent episodes of severe abdominal cramping usually associated with diaphoresis and then diarrhea. She'll have bouts of constipation in between. Exline Etiology of symptoms is not clear #2 chronic GERD Caryl Comes #3 diverticulosis #4 hypertension #5 generalized anxiety disorder  Plan; CT of the abdomen and pelvis with contrast Start Bentyl 10 mg by mouth twice a day Continue Protonix 40 mg by mouth daily Start MiraLAX 17 g in 8 ounces of water daily Patient needs EGD and possible dilation. This will be scheduled with Dr. Fuller Plan. Procedure was discussed in detail with patient including risks and benefits and she is  agreeable to proceed. If CT is unrevealing she will also need colonoscopy and we'll go ahead and schedule both procedures to be done on the same day. Colonoscopy can be canceled if CT scan gives alternative diagnosis.  Janesia Joswick Genia Harold PA-C 01/31/2016   Cc: Sharion Balloon, FNP

## 2016-01-31 NOTE — Patient Instructions (Addendum)
Please go to the basement level to have your labs drawn.  Take Miralax 17 grams in 8 oz of water daily.  We sent prescriptions to [facility]. 1. Suprep for the colonoscopy 2 Bentyl 10 mg.  /You have been scheduled for an endoscopy and colonoscopy. Please follow the written instructions given to you at your visit today. Please pick up your prep supplies at the pharmacy within the next 1-3 days. [facility] If you use inhalers (even only as needed), please bring them with you on the day of your procedure. Your physician has requested that you go to www.startemmi.com and enter the access code given to you at your visit today. This web site gives a general overview about your procedure. However, you should still follow specific instructions given to you by our office regarding your preparation for the procedure.   You have been scheduled for a CT scan of the abdomen and pelvis at [facility] ([address]---this is in the same building as Press photographer).   You are scheduled on 02-02-2016 at 3:30 PM. You should arrive at 3:15 minutes prior to your appointment time for registration. Please follow the written instructions below on the day of your exam:  WARNING: IF YOU ARE ALLERGIC TO IODINE/X-RAY DYE, PLEASE NOTIFY RADIOLOGY IMMEDIATELY AT [phone]! YOU WILL BE GIVEN A 13 HOUR PREMEDICATION PREP.  1) Do not eat or drink anything after 11:30 am (4 hours prior to your test) 2) You have been given 2 bottles of oral contrast to drink. The solution may taste               better if refrigerated, but do NOT add ice or any other liquid to this solution. Shake             well before drinking.    Drink 1 bottle of contrast @ 1:30 PM (2 hours prior to your exam)  Drink 1 bottle of contrast @ 2:30 PM(1 hour prior to your exam)  You may take any medications as prescribed with a small amount of water except for the following: Metformin, Glucophage, Glucovance, Avandamet,  Riomet, Fortamet, Actoplus Met, Janumet, Glumetza or Metaglip. The above medications must be held the day of the exam AND 48 hours after the exam.  The purpose of you drinking the oral contrast is to aid in the visualization of your intestinal tract. The contrast solution may cause some diarrhea. Before your exam is started, you will be given a small amount of fluid to drink. Depending on your individual set of symptoms, you may also receive an intravenous injection of x-ray contrast/dye. Plan on being at [facility] for 30 minutes or long, depending on the type of exam you are having performed.  If you have any questions regarding your exam or if you need to reschedule, you may call the CT department at [phone] between the hours of 8:00 am and 5:00 pm, Monday-Friday.  ________________________________________________________________________

## 2016-01-31 NOTE — Progress Notes (Signed)
Reviewed and agree with initial management plan.  Brittan Mapel T. Aikam Hellickson, MD FACG 

## 2016-02-02 ENCOUNTER — Ambulatory Visit (INDEPENDENT_AMBULATORY_CARE_PROVIDER_SITE_OTHER)
Admission: RE | Admit: 2016-02-02 | Discharge: 2016-02-02 | Disposition: A | Discharge status: Home / Self Care | Payer: Medicare Other | Source: Ambulatory Visit | Attending: Physician Assistant | Admitting: Physician Assistant

## 2016-02-02 DIAGNOSIS — R1012 Left upper quadrant pain: Secondary | ICD-10-CM | POA: Diagnosis not present

## 2016-02-02 DIAGNOSIS — R1084 Generalized abdominal pain: Secondary | ICD-10-CM

## 2016-02-02 DIAGNOSIS — R1011 Right upper quadrant pain: Secondary | ICD-10-CM | POA: Diagnosis not present

## 2016-02-02 DIAGNOSIS — R1319 Other dysphagia: Secondary | ICD-10-CM

## 2016-02-02 DIAGNOSIS — R1010 Upper abdominal pain, unspecified: Secondary | ICD-10-CM | POA: Diagnosis not present

## 2016-02-02 MED ORDER — IOPAMIDOL (ISOVUE-300) INJECTION 61%
100.0000 mL | Freq: Once | INTRAVENOUS | Status: AC | PRN
  Administered 2016-02-02: 100 mL via INTRAVENOUS

## 2016-02-12 ENCOUNTER — Encounter: Payer: Self-pay | Admitting: Cardiology

## 2016-02-12 ENCOUNTER — Ambulatory Visit (INDEPENDENT_AMBULATORY_CARE_PROVIDER_SITE_OTHER): Payer: Medicare Other | Admitting: Cardiology

## 2016-02-12 VITALS — BP 136/84 | HR 55 | Ht 64.0 in | Wt 157.0 lb

## 2016-02-12 DIAGNOSIS — I10 Essential (primary) hypertension: Secondary | ICD-10-CM | POA: Diagnosis not present

## 2016-02-12 DIAGNOSIS — M7989 Other specified soft tissue disorders: Secondary | ICD-10-CM | POA: Diagnosis not present

## 2016-02-12 DIAGNOSIS — E782 Mixed hyperlipidemia: Secondary | ICD-10-CM | POA: Diagnosis not present

## 2016-02-12 DIAGNOSIS — R0789 Other chest pain: Secondary | ICD-10-CM

## 2016-02-12 NOTE — Progress Notes (Signed)
Clinical Summary Kaitlyn Serrano is a 75 y.o.female seen today for follow up of the following medical problems.   1. Leg swelling - echo  showed LVEF 123456, grade I diastolic dysfunction.  - improved after stopping norvasc.  - no recent issues   2 Chest pain - previous workup in 2012 per old [place] notes with negative myoview for ischemia. Echo showed normal LV function - 05/2014 completed exercise MPI. Low risk [facility] treadmill score of 6, no ischemia on imaging.  - can have some occasional chest tightness. Can occur at rest or with activity. Tightness midchest, 7/10. Worst with deep breathing. Lasts just a few seconds. + SOB. Occurs once a week. Started about 1 month ago. Has had increase in frequency and severity since onset. Better with aspirin. DOE at 1 block. Can have some dysphagia solids and liquids.   - upcoming GI evaluation  EGD  3. HTN - does not check regulalrly at home -compliant with meds  4. Hyperlipidemia - stopped zocor due to leg pains recently  - tolerating pravastatin 20mg  twice a week since last visit   5. Palpitations - seen in ER 11/2015 with palpitations. Telemetry monitor throughtout visit showed NSR per ER notes. CT PE negative, negative evaluation for ACS - symptoms come and go over the last few months. Occurs one a month, lasts 1-2 days constantly.   Past Medical History:  Diagnosis Date  . Asthma   . Blood transfusion   . Blurred vision   . CAD (coronary artery disease)   . Chest pain   . CHF (congestive heart failure) ([place])   . Chronic pain syndrome   . Cough   . Cyanosis   . Diabetes ([place])   . Diverticulosis   . Double vision   . 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 Dysphagia   . Dyspnea   . Exposure to TB   . Facial pain   . Generalized weakness   . GERD (gastroesophageal reflux disease)   . GERD (gastroesophageal reflux disease)   . Glaucoma   . Hearing loss   . Heart murmur   . Hemorrhoids   . Hemorrhoids   . High  cholesterol   . Hoarseness   . Hx of cardiovascular stress test    negative 2012 and 2016  . Hypertension   . IBS (irritable bowel syndrome)   . Insomnia   . Intervertebral disc disorder with myelopathy, lumbar region   . Irregular heart beat   . Leg swelling   . Lumbago   . Lumbar spine pain   . MI (myocardial infarction)   . Nasal congestion   . Night sweats   . Palpitations   . Rectal bleeding   . Renal insufficiency   . Ringing in ears   . Sinus infection   . Spondylosis with myelopathy, lumbar region   . Syncope   . Valvular heart disease   . Vertigo   . Vision loss   . Visual disorder   . Wheezing      Allergies  Allergen Reactions  . Bee Venom Anaphylaxis  . Amlodipine     swelling  . Elavil [Amitriptyline Hcl] Other (See Comments)    confusion  . Statins Swelling and Other (See Comments)    Peeling of skin  . Penicillins Rash    To mouth     Current Outpatient Prescriptions  Medication Sig Dispense Refill  . ALPRAZolam (XANAX) 0.5 MG tablet TAKE ONE-HALF TABLET BY  MOUTH TWICE DAILY AS NEEDED FOR ANXIETY /SLEEP 60 tablet 1  . atenolol (TENORMIN) 50 MG tablet Take 1 tablet (50 mg total) by mouth daily. 90 tablet 0  . CARTIA XT 120 MG 24 hr capsule TAKE ONE  BY MOUTH ONCE DAILY 90 capsule 0  . cyclobenzaprine (FLEXERIL) 10 MG tablet Take 1 tablet (10 mg total) by mouth 2 (two) times daily as needed for muscle spasms. 90 tablet 1  . dicyclomine (BENTYL) 10 MG capsule Take 1 tab twice daily. 60 capsule 6  . doxycycline (VIBRA-TABS) 100 MG tablet Take 1 tablet (100 mg total) by mouth 2 (two) times daily. 20 tablet 0  . EPINEPHrine 0.3 mg/0.3 mL IJ SOAJ injection Inject 0.3 mLs (0.3 mg total) into the muscle once. 2 Device 1  . Estradiol Acetate (FEMRING) 0.05 MG/24HR RING Insert ring PV every 3 months (Patient taking differently: Place 1 each vaginally every 3 (three) months. Insert ring every 3 months) 1 each 1  . furosemide (LASIX) 40 MG tablet TAKE ONE  TABLET BY MOUTH ONCE DAILY 90 tablet 1  . HYDROcodone-acetaminophen (NORCO) 10-325 MG tablet Take 1 tablet by mouth every 8 (eight) hours as needed for moderate pain or severe pain.     Marland Kitchen lactulose (CHRONULAC) 10 GM/15ML solution Take 15 mLs (10 g total) by mouth 2 (two) times daily. 240 mL 0  . levofloxacin (LEVAQUIN) 500 MG tablet Take 1 tablet (500 mg total) by mouth daily. 7 tablet 0  . pantoprazole (PROTONIX) 40 MG tablet TAKE  (1)  TABLET TWICE A DAY. (Patient taking differently: Take 40 mg by mouth 2 (two) times daily as needed. ) 180 tablet 1  . polyethylene glycol powder (GLYCOLAX/MIRALAX) powder Take 17 g by mouth 2 (two) times daily as needed. (Patient taking differently: Take 17 g by mouth 2 (two) times daily as needed for mild constipation or moderate constipation. ) 3350 g 11  . traZODone (DESYREL) 50 MG tablet TAKE ONE TABLET BY MOUTH AT BEDTIME 90 tablet 1  . triamcinolone ointment (KENALOG) 0.5 % Apply 1 application topically 2 (two) times daily. (Patient taking differently: Apply 1 application topically daily as needed. ) 30 g 0  . VOLTAREN 1 % GEL      No current facility-administered medications for this visit.      Past Surgical History:  Procedure Laterality Date  . ABDOMINAL HYSTERECTOMY    . BACK SURGERY    . BONE MARROW ASPIRATION    . CERVICAL SPINE SURGERY    . FIXATION KYPHOPLASTY    . LAMINOTOMY    . SPINAL FUSION       Allergies  Allergen Reactions  . Bee Venom Anaphylaxis  . Amlodipine     swelling  . Elavil [Amitriptyline Hcl] Other (See Comments)    confusion  . Statins Swelling and Other (See Comments)    Peeling of skin  . Penicillins Rash    To mouth      Family History  Problem Relation Age of Onset  . Diabetes Mother   . Heart disease Mother   . Hypertension Mother   . Gallbladder disease Mother   . Heart disease Father   . Pneumonia Sister   . Hypertension Brother   . Hyperlipidemia Brother   . Gallbladder disease    .  Hypertension Child      Social History Kaitlyn Serrano reports that she quit smoking about 52 years ago. Her smoking use included Cigarettes. She has a 12.00 pack-year smoking  history. She has never used smokeless tobacco. Kaitlyn Serrano reports that she does not drink alcohol.   Review of Systems CONSTITUTIONAL: No weight loss, fever, chills, weakness or fatigue.  HEENT: Eyes: No visual loss, blurred vision, double vision or yellow sclerae.No hearing loss, sneezing, congestion, runny nose or sore throat.  SKIN: No rash or itching.  CARDIOVASCULAR: per HPI RESPIRATORY: No shortness of breath, cough or sputum.  GASTROINTESTINAL: No anorexia, nausea, vomiting or diarrhea. No abdominal pain or blood.  GENITOURINARY: No burning on urination, no polyuria NEUROLOGICAL: No headache, dizziness, syncope, paralysis, ataxia, numbness or tingling in the extremities. No change in bowel or bladder control.  MUSCULOSKELETAL: No muscle, back pain, joint pain or stiffness.  LYMPHATICS: No enlarged nodes. No history of splenectomy.  PSYCHIATRIC: No history of depression or anxiety.  ENDOCRINOLOGIC: No reports of sweating, cold or heat intolerance. No polyuria or polydipsia.  Marland Kitchen   Physical Examination Vitals:   02/12/16 1306  BP: 136/84  Pulse: (!) 55   Vitals:   02/12/16 1306  Weight: 157 lb (71.2 kg)  Height: 5\' 4"  (1.626 m)    Gen: resting comfortably, no acute distress HEENT: no scleral icterus, pupils equal round and reactive, no palptable cervical adenopathy,  CV: RRR, no m/r/g, no jvd Resp: Clear to auscultation bilaterally GI: abdomen is soft, non-tender, non-distended, normal bowel sounds, no hepatosplenomegaly MSK: extremities are warm, no edema.  Skin: warm, no rash Neuro:  no focal deficits Psych: appropriate affect   Diagnostic Studies  Echo 04/2010 [facility] LVEF 123456, grade I diastolic dysfunction, mild MR  04/2010 ExerciseMyoview No ischemia. Exercised 8 minutes, 9 METs,  95% THR.  12/2007 Renal artery [place] No stenosis, normal kidney size.   06/01/13 Clinic EKG NSR, LAD  05/2014 Exercise MPI IMPRESSION: 1. No reversible ischemia or infarction.  2. Normal left ventricular wall motion.  3. Left ventricular ejection fraction 57%  4. Low-risk stress test findings*.   08/2014 Echo Study Conclusions  - Left ventricle: The cavity size was normal. Wall thickness was normal. Systolic function was normal. The estimated ejection fraction was in the range of 60% to 65%. Wall motion was normal; there were no regional wall motion abnormalities. Doppler parameters are consistent with abnormal left ventricular relaxation (grade 1 diastolic dysfunction). - Aortic valve: Mildly calcified annulus. Trileaflet. - Mitral valve: Mildly calcified annulus. Mildly calcified leaflets . There was mild regurgitation. - Tricuspid valve: There was mild regurgitation.    Assessment and Plan  1. Leg swelling - resolved, appears to have been related to recently starting norvasc - we will continue to monitor at this time.   2. Chest pain - several year history, negative stress testing in 2012 and 2016 - fairly significant dysphagia symptoms, may be related.She has upcoming GI evaluation and EGD   3. HTN -at goal, continue current meds  4. Hyperlipidemia - side effects on statins. Tolerating pravastatin 20mg  2 days a week, we will continue    F/u 6 months  Arnoldo Lenis, M.D.,

## 2016-02-12 NOTE — Patient Instructions (Signed)

## 2016-02-13 ENCOUNTER — Telehealth: Payer: Self-pay | Admitting: Family

## 2016-02-13 MED ORDER — ONDANSETRON HCL 4 MG PO TABS: 4.0000 mg | ORAL_TABLET | Freq: Three times a day (TID) | ORAL | 0 refills | Status: DC | PRN

## 2016-02-13 NOTE — Telephone Encounter (Signed)
Pt has nausea, started this AM Denies fever,diarrhea or vomitting Does not feel like she can come in Wants RX sent to [facility] Please advise

## 2016-02-13 NOTE — Telephone Encounter (Signed)
Zofran Prescription sent to pharmacy  ° °

## 2016-02-13 NOTE — Telephone Encounter (Signed)
Patient aware.

## 2016-02-22 DIAGNOSIS — G894 Chronic pain syndrome: Secondary | ICD-10-CM | POA: Diagnosis not present

## 2016-02-22 DIAGNOSIS — M47897 Other spondylosis, lumbosacral region: Secondary | ICD-10-CM | POA: Diagnosis not present

## 2016-02-22 DIAGNOSIS — M545 Low back pain: Secondary | ICD-10-CM | POA: Diagnosis not present

## 2016-02-22 DIAGNOSIS — M5106 Intervertebral disc disorders with myelopathy, lumbar region: Secondary | ICD-10-CM | POA: Diagnosis not present

## 2016-02-26 IMAGING — CR DG CHEST 2V
2 series · 2 of 2 positions shown · non-contrast
Comparison: 03/15/2014

CLINICAL DATA: Shortness of breath and left arm pain today.

EXAM:
CHEST  2 VIEW

[w chest pa]
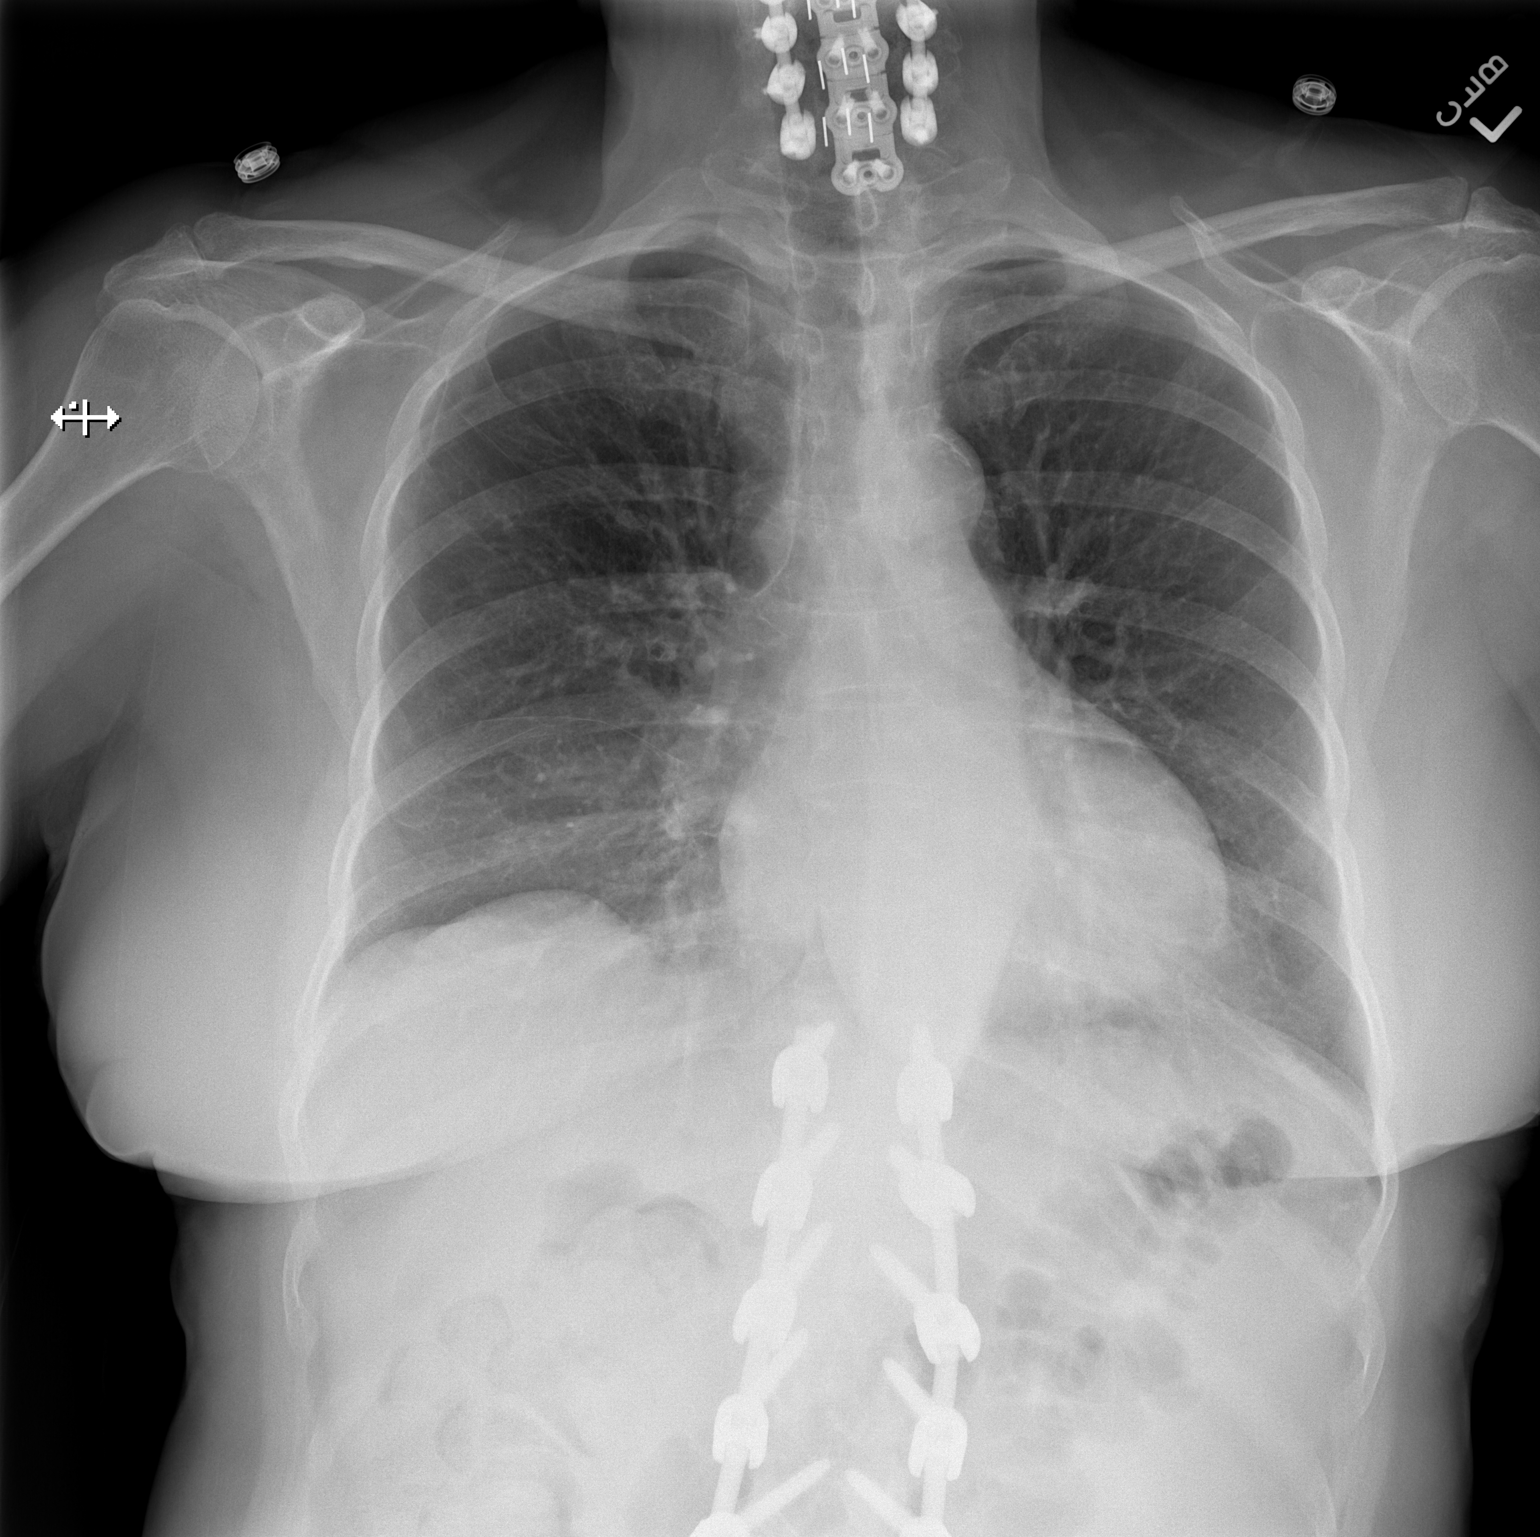

[w chest lat]
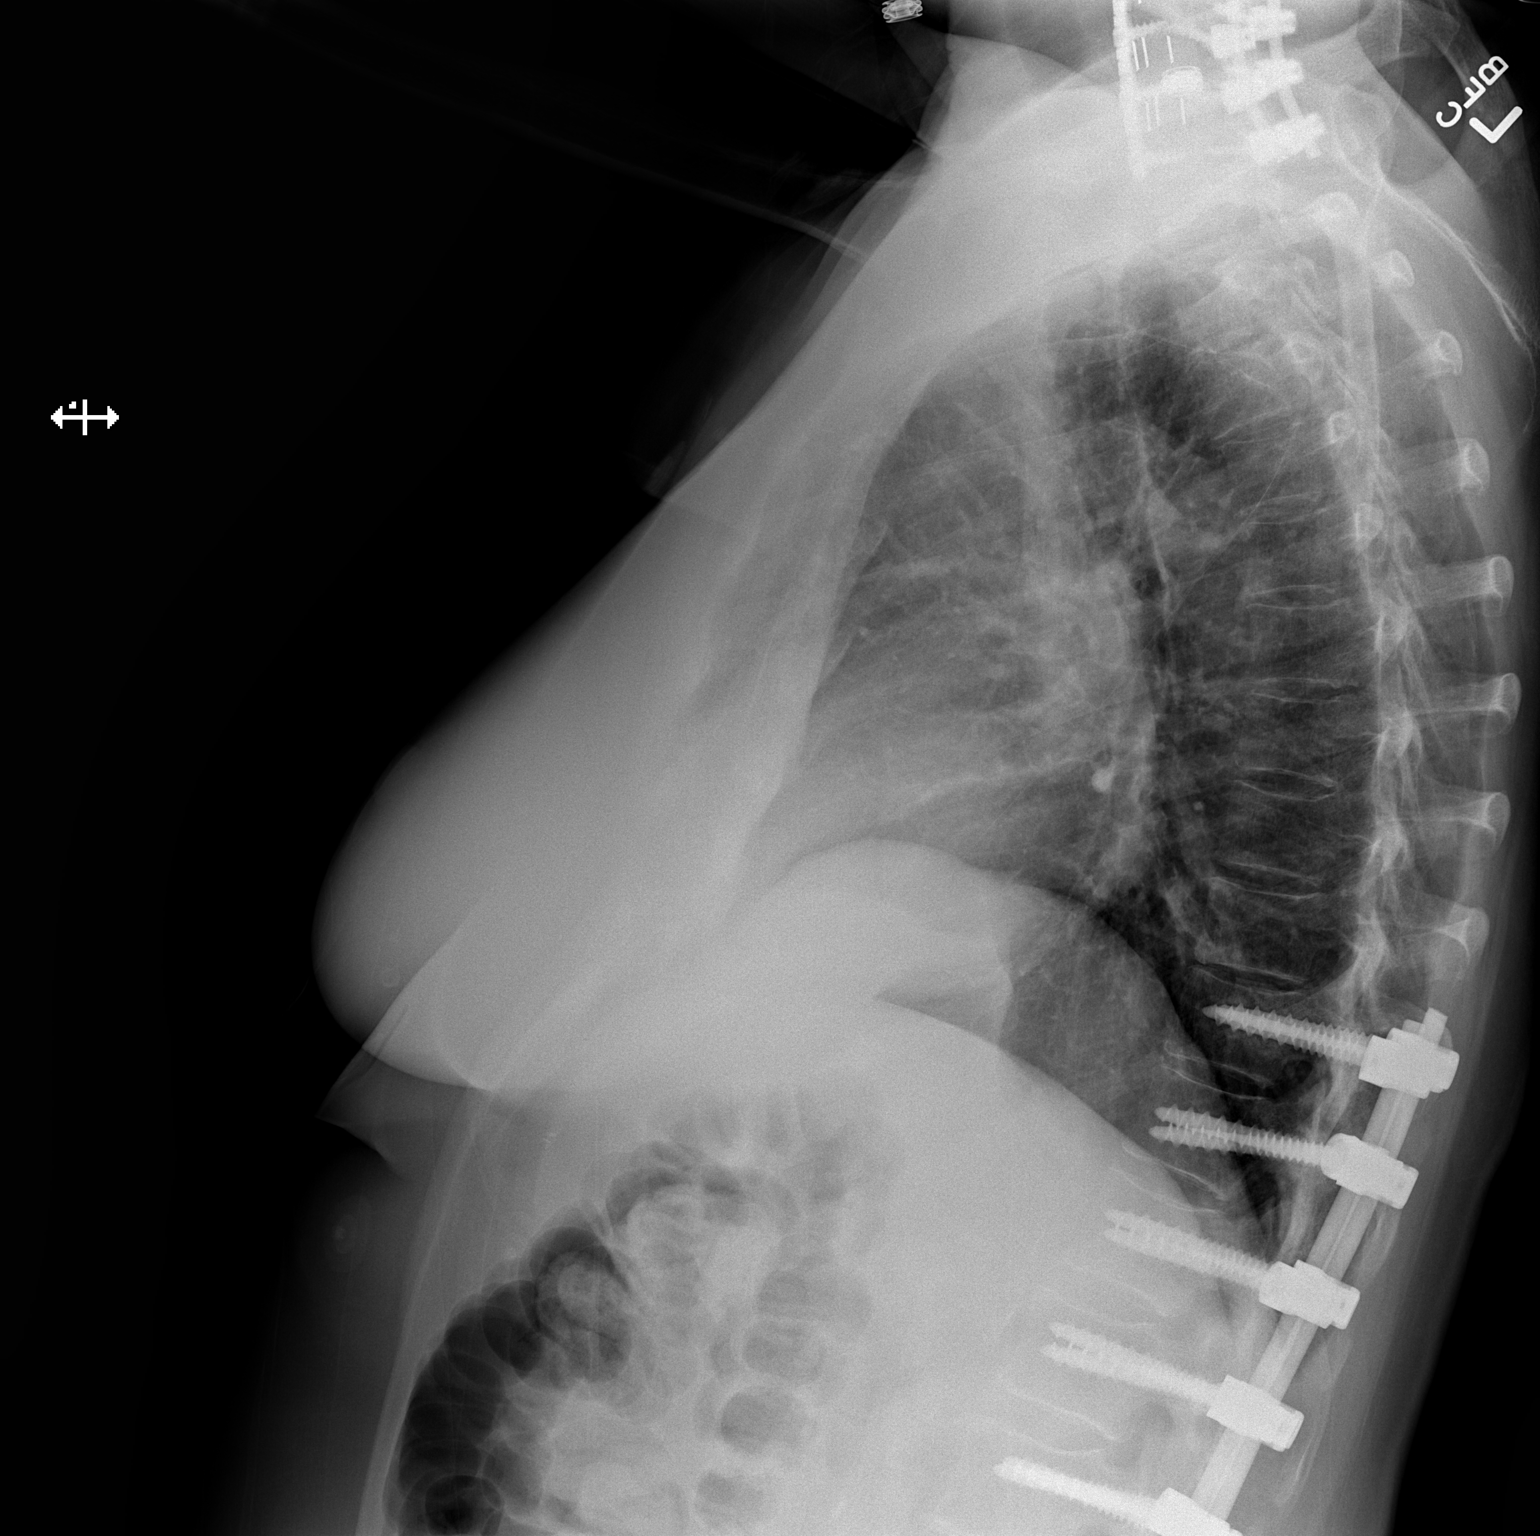

[2 of 2 positions shown; findings below may reference images not displayed]

FINDINGS: The cardiac silhouette, mediastinal and hilar contours are within
normal limits and stable. There is mild tortuosity and calcification
of the thoracic aorta. There are chronic bronchitic changes and mild
interstitial thickening but no infiltrates, edema or effusions. The
bony thorax is intact. Cervical and thoracolumbar fusion hardware is
noted.
IMPRESSION: No acute cardiopulmonary findings. Chronic bronchitic lung changes
and basilar scarring.

## 2016-02-28 ENCOUNTER — Other Ambulatory Visit: Payer: Self-pay | Admitting: Family

## 2016-02-28 DIAGNOSIS — I10 Essential (primary) hypertension: Secondary | ICD-10-CM

## 2016-03-08 ENCOUNTER — Other Ambulatory Visit: Payer: Self-pay | Admitting: Family

## 2016-03-08 DIAGNOSIS — N951 Menopausal and female climacteric states: Secondary | ICD-10-CM

## 2016-03-21 ENCOUNTER — Ambulatory Visit (INDEPENDENT_AMBULATORY_CARE_PROVIDER_SITE_OTHER): Payer: Medicare Other | Admitting: Family

## 2016-03-21 ENCOUNTER — Encounter: Payer: Self-pay | Admitting: Family

## 2016-03-21 VITALS — BP 121/82 | HR 111 | Temp 96.7°F | Ht 63.0 in | Wt 159.0 lb

## 2016-03-21 DIAGNOSIS — J069 Acute upper respiratory infection, unspecified: Secondary | ICD-10-CM | POA: Diagnosis not present

## 2016-03-21 MED ORDER — PREDNISONE 10 MG (21) PO TBPK: ORAL_TABLET | ORAL | 0 refills | Status: DC

## 2016-03-21 MED ORDER — FLUTICASONE PROPIONATE 50 MCG/ACT NA SUSP: 2.0000 | Freq: Every day | NASAL | 6 refills | Status: DC

## 2016-03-21 NOTE — Patient Instructions (Signed)
Upper Respiratory Infection, Adult Most upper respiratory infections (URIs) are caused by a virus. A URI affects the nose, throat, and upper air passages. The most common type of URI is often called "the common cold." Follow these instructions at home:  Take medicines only as told by your doctor.  Gargle warm saltwater or take cough drops to comfort your throat as told by your doctor.  Use a warm mist humidifier or inhale steam from a shower to increase air moisture. This may make it easier to breathe.  Drink enough fluid to keep your pee (urine) clear or pale yellow.  Eat soups and other clear broths.  Have a healthy diet.  Rest as needed.  Go back to work when your fever is gone or your doctor says it is okay.  You may need to stay home longer to avoid giving your URI to others.  You can also wear a face mask and wash your hands often to prevent spread of the virus.  Use your inhaler more if you have asthma.  Do not use any tobacco products, including cigarettes, chewing tobacco, or electronic cigarettes. If you need help quitting, ask your doctor. Contact a doctor if:  You are getting worse, not better.  Your symptoms are not helped by medicine.  You have chills.  You are getting more short of breath.  You have brown or red mucus.  You have yellow or brown discharge from your nose.  You have pain in your face, especially when you bend forward.  You have a fever.  You have puffy (swollen) neck glands.  You have pain while swallowing.  You have white areas in the back of your throat. Get help right away if:  You have very bad or constant:  Headache.  Ear pain.  Pain in your forehead, behind your eyes, and over your cheekbones (sinus pain).  Chest pain.  You have long-lasting (chronic) lung disease and any of the following:  Wheezing.  Long-lasting cough.  Coughing up blood.  A change in your usual mucus.  You have a stiff neck.  You have  changes in your:  Vision.  Hearing.  Thinking.  Mood. This information is not intended to replace advice given to you by your health care provider. Make sure you discuss any questions you have with your health care provider. Document Released: 07/31/2007 Document Revised: 10/15/2015 Document Reviewed: 05/19/2013 Elsevier Interactive Patient Education  2017 [employer]  

## 2016-03-21 NOTE — Progress Notes (Signed)
   Subjective:    Patient ID: Kaitlyn Serrano, female    DOB: Sep 10, 1940, 76 y.o.   MRN: [id number]  Cough  This is a recurrent problem. The current episode started 1 to 4 weeks ago. The problem has been waxing and waning. The problem occurs every few minutes. The cough is non-productive. Associated symptoms include chills, ear congestion, headaches, myalgias, nasal congestion, postnasal drip, rhinorrhea, a sore throat and wheezing. Pertinent negatives include no ear pain or fever. The symptoms are aggravated by pollens and lying down. She has tried rest for the symptoms.      Review of Systems  Constitutional: Positive for chills. Negative for fever.  HENT: Positive for postnasal drip, rhinorrhea and sore throat. Negative for ear pain.   Respiratory: Positive for cough and wheezing.   Musculoskeletal: Positive for myalgias.  Neurological: Positive for headaches.  All other systems reviewed and are negative.      Objective:   Physical Exam  Constitutional: She is oriented to person, place, and time. She appears well-developed and well-nourished. No distress.  HENT:  Head: Normocephalic and atraumatic.  Right Ear: External ear normal.  Left Ear: External ear normal.  Nose: Mucosal edema present.  Mouth/Throat: Posterior oropharyngeal erythema present.  Eyes: Pupils are equal, round, and reactive to light.  Neck: Normal range of motion. Neck supple. No thyromegaly present.  Cardiovascular: Normal rate, regular rhythm, normal heart sounds and intact distal pulses.   No murmur heard. Pulmonary/Chest: Effort normal and breath sounds normal. No respiratory distress. She has no wheezes.  Dry nonproductive cough   Abdominal: Soft. Bowel sounds are normal. She exhibits no distension. There is no tenderness.  Musculoskeletal: Normal range of motion. She exhibits no edema or tenderness.  Neurological: She is alert and oriented to person, place, and time. She has normal reflexes. No cranial  nerve deficit.  Skin: Skin is warm and dry.  Psychiatric: She has a normal mood and affect. Her behavior is normal. Judgment and thought content normal.  Vitals reviewed.     BP 121/82   Pulse (!) 111   Temp (!) 96.7 F (35.9 C) (Oral)   Ht 5\' 3"  (1.6 m)   Wt 159 lb (72.1 kg)   BMI 28.17 kg/m      Assessment & Plan:  1. Acute upper respiratory infection - Take meds as prescribed - Use a cool mist humidifier  -Use saline nose sprays frequently -Saline irrigations of the nose can be very helpful if done frequently.  * 4X daily for 1 week*  * Use of a nettie pot can be helpful with this. Follow directions with this* -Force fluids -For any cough or congestion  Use plain Mucinex- regular strength or max strength is fine   * Children- consult with Pharmacist for dosing -For fever or aces or pains- take tylenol or ibuprofen appropriate for age and weight.  * for fevers greater than 101 orally you may alternate ibuprofen and tylenol every  3 hours. -Throat lozenges if help - predniSONE (STERAPRED UNI-PAK 21 TAB) 10 MG (21) TBPK tablet; Use as directed  Dispense: 21 tablet; Refill: 0 - fluticasone (FLONASE) 50 MCG/ACT nasal spray; Place 2 sprays into both nostrils daily.  Dispense: 16 g; Refill: [place], FNP

## 2016-04-02 ENCOUNTER — Ambulatory Visit (AMBULATORY_SURGERY_CENTER): Payer: Medicare Other | Admitting: Gastroenterology

## 2016-04-02 ENCOUNTER — Encounter: Payer: Self-pay | Admitting: Gastroenterology

## 2016-04-02 VITALS — BP 156/76 | HR 63 | Temp 96.2°F | Resp 45 | Ht 63.0 in | Wt 160.0 lb

## 2016-04-02 DIAGNOSIS — K222 Esophageal obstruction: Secondary | ICD-10-CM

## 2016-04-02 DIAGNOSIS — D120 Benign neoplasm of cecum: Secondary | ICD-10-CM

## 2016-04-02 DIAGNOSIS — R1012 Left upper quadrant pain: Secondary | ICD-10-CM

## 2016-04-02 DIAGNOSIS — K219 Gastro-esophageal reflux disease without esophagitis: Secondary | ICD-10-CM | POA: Diagnosis not present

## 2016-04-02 DIAGNOSIS — K449 Diaphragmatic hernia without obstruction or gangrene: Secondary | ICD-10-CM

## 2016-04-02 DIAGNOSIS — R1310 Dysphagia, unspecified: Secondary | ICD-10-CM | POA: Diagnosis not present

## 2016-04-02 DIAGNOSIS — R194 Change in bowel habit: Secondary | ICD-10-CM

## 2016-04-02 DIAGNOSIS — D123 Benign neoplasm of transverse colon: Secondary | ICD-10-CM | POA: Diagnosis not present

## 2016-04-02 MED ORDER — SODIUM CHLORIDE 0.9 % IV SOLN: 500.0000 mL | INTRAVENOUS | Status: DC

## 2016-04-02 NOTE — Op Note (Signed)
[facility] Patient Name: Kaitlyn Serrano Procedure Date: 04/02/2016 2:18 PM MRN: [id number] Endoscopist: Ladene Artist , MD Age: 76 Referring MD:  Date of Birth: 10-Dec-1940 Gender: Female Account #: [id number] Procedure:                Upper GI endoscopy Indications:              Abdominal pain in the left upper quadrant, Dysphagia Medicines:                Monitored Anesthesia Care Procedure:                Pre-Anesthesia Assessment:                           - Prior to the procedure, a History and Physical                            was performed, and patient medications and                            allergies were reviewed. The patient's tolerance of                            previous anesthesia was also reviewed. The risks                            and benefits of the procedure and the sedation                            options and risks were discussed with the patient.                            All questions were answered, and informed consent                            was obtained. Prior Anticoagulants: The patient has                            taken no previous anticoagulant or antiplatelet                            agents. ASA Grade Assessment: III - A patient with                            severe systemic disease. After reviewing the risks                            and benefits, the patient was deemed in                            satisfactory condition to undergo the procedure.                           After obtaining informed consent, the endoscope was  passed under direct vision. Throughout the                            procedure, the patient's blood pressure, pulse, and                            oxygen saturations were monitored continuously. The                            Model GIF-HQ190 [id number]) scope was introduced                            through the mouth, and advanced to the second part     of duodenum. The upper GI endoscopy was                            accomplished without difficulty. The patient                            tolerated the procedure well. Scope In: Scope Out: Findings:                 One moderate benign-appearing, intrinsic stenosis                            was found 32 cm from the incisors. This measured                            1.1 cm (inner diameter) and was traversed. A                            guidewire was placed and the scope was withdrawn.                            Dilations were performed with a Savary dilator with                            no resistance at 12 mm. Estimated blood loss: none.                            Dilation was performed with 13 mm, 14 mm and 15 mm                            with mild resistance. Estimated blood loss: none.                           The exam of the esophagus was otherwise normal.                           A medium-sized hiatal hernia was present.                           The duodenal bulb and second portion of the  duodenum were normal.                           The exam of the stomach was otherwise normal. Complications:            No immediate complications. Estimated Blood Loss:     Estimated blood loss: none. Impression:               - Benign-appearing esophageal stenosis. Dilated.                           - Medium-sized hiatal hernia.                           - Normal duodenal bulb and second portion of the                            duodenum.                           - No specimens collected. Recommendation:           - Patient has a contact number available for                            emergencies. The signs and symptoms of potential                            delayed complications were discussed with the                            patient. Return to normal activities tomorrow.                            Written discharge instructions were provided to the                             patient.                           - Clear liquid diet for 2 hours then advance as                            tolerated to soft diet today. Resume prior diet                            tomorrow.                           - Continue present medications including                            pantoprazole 40 mg po bid. Ladene Artist, MD 04/02/2016 3:18:22 PM This report has been signed electronically.

## 2016-04-02 NOTE — Progress Notes (Signed)
Report to PACU, RN, vss, BBS= Clear.  

## 2016-04-02 NOTE — Op Note (Addendum)
[facility] Patient Name: Kaitlyn Serrano Procedure Date: 04/02/2016 2:18 PM MRN: [id number] Endoscopist: Ladene Artist , MD Age: 76 Referring MD:  Date of Birth: 1940-08-26 Gender: Female Account #: [id number] Procedure:                Colonoscopy Indications:              Change in bowel habits Medicines:                Monitored Anesthesia Care Procedure:                Pre-Anesthesia Assessment:                           - Prior to the procedure, a History and Physical                            was performed, and patient medications and                            allergies were reviewed. The patient's tolerance of                            previous anesthesia was also reviewed. The risks                            and benefits of the procedure and the sedation                            options and risks were discussed with the patient.                            All questions were answered, and informed consent                            was obtained. Prior Anticoagulants: The patient has                            taken no previous anticoagulant or antiplatelet                            agents. ASA Grade Assessment: III - A patient with                            severe systemic disease. After reviewing the risks                            and benefits, the patient was deemed in                            satisfactory condition to undergo the procedure.                           After obtaining informed consent, the colonoscope  was passed under direct vision. Throughout the                            procedure, the patient's blood pressure, pulse, and                            oxygen saturations were monitored continuously. The                            Model PCF-H190L [id number]) scope was introduced                            through the anus and advanced to the the cecum,                            identified by appendiceal orifice  and ileocecal                            valve. The ileocecal valve, appendiceal orifice,                            and rectum were photographed. The quality of the                            bowel preparation was adequate after extensive                            lavage and suctioning. The colonoscopy was                            performed without difficulty. The patient tolerated                            the procedure well. Scope In: 2:32:32 PM Scope Out: 2:51:56 PM Scope Withdrawal Time: 0 hours 14 minutes 49 seconds  Total Procedure Duration: 0 hours 19 minutes 24 seconds  Findings:                 The perianal and digital rectal examinations were                            normal.                           Three sessile polyps were found in the transverse                            colon (1) and cecum (2). The polyps were 5 to 8 mm                            in size. These polyps were removed with a cold                            snare. Resection and retrieval were complete.  Internal hemorrhoids were found during                            retroflexion. The hemorrhoids were small and Grade                            I (internal hemorrhoids that do not prolapse).                           Multiple medium-mouthed diverticula were found in                            the left colon. There was narrowing of the colon in                            association with the diverticular opening. There                            was evidence of diverticular spasm. There was                            evidence of an impacted diverticulum. There was no                            evidence of diverticular bleeding.                           The exam was otherwise without abnormality on                            direct and retroflexion views. Complications:            No immediate complications. Estimated blood loss:                            None. Estimated Blood  Loss:     Estimated blood loss: none. Impression:               - Three 5 to 8 mm polyp in the cecum and transverse                            colon, removed with a cold snare. Resected and                            retrieved.                           - Internal hemorrhoids.                           - Severe diverticulosis in the left colon.                           - The examination was otherwise normal on direct  and retroflexion views. Recommendation:           - Patient has a contact number available for                            emergencies. The signs and symptoms of potential                            delayed complications were discussed with the                            patient. Return to normal activities tomorrow.                            Written discharge instructions were provided to the                            patient.                           - High fiber diet.                           - Continue present medications.                           - Bentyl 10 mg qid, ac and hs                           - Await pathology results.                           - No repeat colonoscopy due to age. Ladene Artist, MD 04/02/2016 3:12:20 PM This report has been signed electronically.

## 2016-04-02 NOTE — Progress Notes (Signed)
Called to room to assist during endoscopic procedure.  Patient ID and intended procedure confirmed with present staff. Received instructions for my participation in the procedure from the performing physician.  

## 2016-04-02 NOTE — Patient Instructions (Addendum)
YOU HAD AN ENDOSCOPIC PROCEDURE TODAY AT [facility]:   Refer to the procedure report that was given to you for any specific questions about what was found during the examination.  If the procedure report does not answer your questions, please call your gastroenterologist to clarify.  If you requested that your care partner not be given the details of your procedure findings, then the procedure report has been included in a sealed envelope for you to review at your convenience later.  YOU SHOULD EXPECT: Some feelings of bloating in the abdomen. Passage of more gas than usual.  Walking can help get rid of the air that was put into your GI tract during the procedure and reduce the bloating. If you had a lower endoscopy (such as a colonoscopy or flexible sigmoidoscopy) you may notice spotting of blood in your stool or on the toilet paper. If you underwent a bowel prep for your procedure, you may not have a normal bowel movement for a few days.  Please Note:  You might notice some irritation and congestion in your nose or some drainage.  This is from the oxygen used during your procedure.  There is no need for concern and it should clear up in a day or so.  SYMPTOMS TO REPORT IMMEDIATELY:   Following lower endoscopy (colonoscopy or flexible sigmoidoscopy):  Excessive amounts of blood in the stool  Significant tenderness or worsening of abdominal pains  Swelling of the abdomen that is new, acute  Fever of 100F or higher   Following upper endoscopy (EGD)  Vomiting of blood or coffee ground material  New chest pain or pain under the shoulder blades  Painful or persistently difficult swallowing  New shortness of breath  Fever of 100F or higher  Black, tarry-looking stools  For urgent or emergent issues, a gastroenterologist can be reached at any hour by calling [phone].   DIET:  Nothing by mouth until 4pm.  You may have clear liquids at that time.  You may have a soft  duet at [facility] may have a regular diet tomorrow.  Drink plenty of fluids but you should avoid alcoholic beverages for 24 hours.  Try to increase the fiber in your diet starting tomorrow. Drink plenty of water.  ACTIVITY:  You should plan to take it easy for the rest of today and you should NOT DRIVE or use heavy machinery until tomorrow (because of the sedation medicines used during the test).    FOLLOW UP: Our staff will call the number listed on your records the next business day following your procedure to check on you and address any questions or concerns that you may have regarding the information given to you following your procedure. If we do not reach you, we will leave a message.  However, if you are feeling well and you are not experiencing any problems, there is no need to return our call.  We will assume that you have returned to your regular daily activities without incident.  If any biopsies were taken you will be contacted by phone or by letter within the next 1-3 weeks.  Please call us at [phone] if you have not heard about the biopsies in 3 weeks.   Be sure to take your bentyl as ordered, and your miralax once or twice per day.  SIGNATURES/CONFIDENTIALITY: You and/or your care partner have signed paperwork which will be entered into your electronic medical record.  These signatures attest to the  fact that that the information above on your After Visit Summary has been reviewed and is understood.  Full responsibility of the confidentiality of this discharge information lies with you and/or your care-partner.  Read all of the handouts given to you by your recovery room nurse today.

## 2016-04-03 ENCOUNTER — Telehealth: Payer: Self-pay

## 2016-04-03 ENCOUNTER — Telehealth: Payer: Self-pay | Admitting: Gastroenterology

## 2016-04-03 NOTE — Telephone Encounter (Signed)
  Follow up Call-  Call back number 04/02/2016  Post procedure Call Back phone  # [phone]  Permission to leave phone message Yes  Some recent data might be hidden     Patient questions:  Do you have a fever, pain , or abdominal swelling? No. Pain Score  0 *  Have you tolerated food without any problems? Yes.    Have you been able to return to your normal activities? Yes.    Do you have any questions about your discharge instructions: Diet   No. Medications  No. Follow up visit  No.  Do you have questions or concerns about your Care? No.  Actions: * If pain score is 4 or above: No action needed, pain <4.

## 2016-04-03 NOTE — Telephone Encounter (Signed)
Patient notified that she should remain on her Pantoprazole 40 mg BID. She has this rx at home.  She will call back for any additional questions or concerns.

## 2016-04-08 ENCOUNTER — Other Ambulatory Visit: Payer: Self-pay | Admitting: Family

## 2016-04-09 ENCOUNTER — Encounter: Payer: Self-pay | Admitting: Physician Assistant

## 2016-04-09 ENCOUNTER — Ambulatory Visit (INDEPENDENT_AMBULATORY_CARE_PROVIDER_SITE_OTHER): Payer: Medicare Other | Admitting: Physician Assistant

## 2016-04-09 VITALS — BP 133/72 | HR 62 | Temp 97.2°F | Ht 63.0 in | Wt 162.4 lb

## 2016-04-09 DIAGNOSIS — J40 Bronchitis, not specified as acute or chronic: Secondary | ICD-10-CM

## 2016-04-09 MED ORDER — CIPROFLOXACIN HCL 500 MG PO TABS: 500.0000 mg | ORAL_TABLET | Freq: Two times a day (BID) | ORAL | 0 refills | Status: DC

## 2016-04-09 MED ORDER — ALBUTEROL SULFATE HFA 108 (90 BASE) MCG/ACT IN AERS: 2.0000 | INHALATION_SPRAY | Freq: Four times a day (QID) | RESPIRATORY_TRACT | 0 refills | Status: DC | PRN

## 2016-04-09 MED ORDER — METHYLPREDNISOLONE ACETATE 80 MG/ML IJ SUSP
80.0000 mg | Freq: Once | INTRAMUSCULAR | Status: AC
  Administered 2016-04-09: 80 mg via INTRAMUSCULAR

## 2016-04-09 NOTE — Patient Instructions (Signed)

## 2016-04-10 ENCOUNTER — Ambulatory Visit: Payer: Self-pay | Admitting: Physician Assistant

## 2016-04-11 ENCOUNTER — Encounter: Payer: Self-pay | Admitting: Gastroenterology

## 2016-04-11 NOTE — Progress Notes (Signed)
BP 133/72   Pulse 62   Temp 97.2 F (36.2 C) (Oral)   Ht 5\' 3"  (1.6 m)   Wt 162 lb 6.4 oz (73.7 kg)   BMI 28.77 kg/m    Subjective:    Patient ID: Kaitlyn Serrano, female    DOB: 1940/04/10, 76 y.o.   MRN: [id number]  HPI: Kaitlyn Serrano is a 76 y.o. female presenting on 04/09/2016 for Chills (x 1 week); Generalized Body Aches; Headache; and Ear Pain (bilateral.)  Patient with several days of progressing upper respiratory and bronchial symptoms. Initially there was more upper respiratory congestion. This progressed to having significant cough that is productive throughout the day and severe at night. There is occasional wheezing after coughing. They will sometimes have slight dyspnea on exertion. It is productive mucus that is yellow in color. Denies any blood.   Relevant past medical, surgical, family and social history reviewed and updated as indicated. Allergies and medications reviewed and updated.  Past Medical History:  Diagnosis Date  . Asthma   . Blood transfusion   . Blurred vision   . CAD (coronary artery disease)   . Chest pain   . CHF (congestive heart failure) ([place])   . Chronic pain syndrome   . Cough   . Cyanosis   . Diabetes ([place])   . Diverticulosis   . Double vision   . 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 Dysphagia   . Dyspnea   . Exposure to TB   . Facial pain   . Generalized weakness   . GERD (gastroesophageal reflux disease)   . GERD (gastroesophageal reflux disease)   . Glaucoma   . Hearing loss   . Heart murmur   . Hemorrhoids   . Hemorrhoids   . High cholesterol   . Hoarseness   . Hx of cardiovascular stress test    negative 2012 and 2016  . Hypertension   . IBS (irritable bowel syndrome)   . Insomnia   . Intervertebral disc disorder with myelopathy, lumbar region   . Irregular heart beat   . Leg swelling   . Lumbago   . Lumbar spine pain   . MI (myocardial infarction)   . Nasal congestion   . Night sweats   . Palpitations   .  Rectal bleeding   . Renal insufficiency   . Ringing in ears   . Sinus infection   . Spondylosis with myelopathy, lumbar region   . Syncope   . Valvular heart disease   . Vertigo   . Vision loss   . Visual disorder   . Wheezing     Past Surgical History:  Procedure Laterality Date  . ABDOMINAL HYSTERECTOMY    . BACK SURGERY    . BONE MARROW ASPIRATION    . CERVICAL SPINE SURGERY    . FIXATION KYPHOPLASTY    . LAMINOTOMY    . SPINAL FUSION      Review of Systems  Constitutional: Positive for fever. Negative for activity change, appetite change and fatigue.  HENT: Positive for congestion, sinus pain and sinus pressure.   Eyes: Negative.   Respiratory: Positive for cough and wheezing.   Cardiovascular: Negative.  Negative for chest pain, palpitations and leg swelling.  Gastrointestinal: Negative.  Negative for abdominal pain.  Endocrine: Negative.   Genitourinary: Negative.  Negative for dysuria.  Musculoskeletal: Negative.   Skin: Negative.   Neurological: Negative.     Allergies as of 04/09/2016  Reactions   Bee Venom Anaphylaxis   Amlodipine    swelling   Elavil [amitriptyline Hcl] Other (See Comments)   confusion   Statins Swelling, Other (See Comments)   Peeling of skin   Penicillins Rash   To mouth      Medication List       Accurate as of 04/09/16 11:59 PM. Always use your most recent med list.          albuterol 108 (90 Base) MCG/ACT inhaler Commonly known as:  PROVENTIL HFA;VENTOLIN HFA Inhale 2 puffs into the lungs every 6 (six) hours as needed for wheezing or shortness of breath.   ALPRAZolam 0.5 MG tablet Commonly known as:  XANAX TAKE ONE-HALF TABLET BY MOUTH TWICE DAILY AS NEEDED FOR ANXIETY /SLEEP   atenolol 50 MG tablet Commonly known as:  TENORMIN Take 1 tablet (50 mg total) by mouth daily.   CARTIA XT 120 MG 24 hr capsule Generic drug:  diltiazem TAKE ONE CAPSULE BY MOUTH ONCE DAILY   ciprofloxacin 500 MG tablet Commonly  known as:  CIPRO Take 1 tablet (500 mg total) by mouth 2 (two) times daily.   cyclobenzaprine 10 MG tablet Commonly known as:  FLEXERIL Take 1 tablet (10 mg total) by mouth 2 (two) times daily as needed for muscle spasms.   dicyclomine 10 MG capsule Commonly known as:  BENTYL Take 1 tab twice daily.   EPINEPHrine 0.3 mg/0.3 mL Soaj injection Commonly known as:  EPI-PEN Inject 0.3 mLs (0.3 mg total) into the muscle once.   FEMRING 0.05 MG/24HR Ring Generic drug:  Estradiol Acetate INSERT RING PER VIGINA EVERY 3 MONTHS   fluticasone 50 MCG/ACT nasal spray Commonly known as:  FLONASE Place 2 sprays into both nostrils daily.   furosemide 40 MG tablet Commonly known as:  LASIX TAKE ONE TABLET BY MOUTH ONCE DAILY   HYDROcodone-acetaminophen 10-325 MG tablet Commonly known as:  NORCO Take 1 tablet by mouth every 8 (eight) hours as needed for moderate pain or severe pain.   lactulose 10 GM/15ML solution Commonly known as:  CHRONULAC Take 15 mLs (10 g total) by mouth 2 (two) times daily.   ondansetron 4 MG tablet Commonly known as:  ZOFRAN Take 1 tablet (4 mg total) by mouth every 8 (eight) hours as needed for nausea or vomiting.   pantoprazole 40 MG tablet Commonly known as:  PROTONIX TAKE ONE TABLET BY MOUTH TWICE DAILY   polyethylene glycol powder powder Commonly known as:  GLYCOLAX/MIRALAX Take 17 g by mouth 2 (two) times daily as needed.   traZODone 50 MG tablet Commonly known as:  DESYREL TAKE ONE TABLET BY MOUTH AT BEDTIME   triamcinolone ointment 0.5 % Commonly known as:  KENALOG Apply 1 application topically 2 (two) times daily.   VOLTAREN 1 % Gel Generic drug:  diclofenac sodium Apply 4 g topically as needed.          Objective:    BP 133/72   Pulse 62   Temp 97.2 F (36.2 C) (Oral)   Ht 5\' 3"  (1.6 m)   Wt 162 lb 6.4 oz (73.7 kg)   BMI 28.77 kg/m   Allergies  Allergen Reactions  . Bee Venom Anaphylaxis  . Amlodipine     swelling  . Elavil  [Amitriptyline Hcl] Other (See Comments)    confusion  . Statins Swelling and Other (See Comments)    Peeling of skin  . Penicillins Rash    To mouth    Physical  Exam  Constitutional: She is oriented to person, place, and time. She appears well-developed and well-nourished.  HENT:  Head: Normocephalic and atraumatic.  Right Ear: There is drainage and tenderness.  Left Ear: There is drainage and tenderness.  Nose: Mucosal edema and rhinorrhea present. Right sinus exhibits maxillary sinus tenderness and frontal sinus tenderness. Left sinus exhibits maxillary sinus tenderness and frontal sinus tenderness.  Mouth/Throat: Oropharyngeal exudate and posterior oropharyngeal erythema present.  Eyes: Conjunctivae and EOM are normal. Pupils are equal, round, and reactive to light.  Neck: Normal range of motion. Neck supple.  Cardiovascular: Normal rate, regular rhythm, normal heart sounds and intact distal pulses.   Pulmonary/Chest: Effort normal. She has wheezes in the right upper field and the left upper field.  Abdominal: Soft. Bowel sounds are normal.  Neurological: She is alert and oriented to person, place, and time. She has normal reflexes.  Skin: Skin is warm and dry. No rash noted.  Psychiatric: She has a normal mood and affect. Her behavior is normal. Judgment and thought content normal.        Assessment & Plan:   1. Bronchitis - methylPREDNISolone acetate (DEPO-MEDROL) injection 80 mg; Inject 1 mL (80 mg total) into the muscle once.   Continue all other maintenance medications as listed above.  Follow up plan: Return in about 10 years (around 04/09/2026) for recheck with Serita Sheller.  Educational handout given for bronchitis   Terald Sleeper PA-C [facility] [id number]  [place], [place] [address] [phone]   04/11/2016, 9:02 PM

## 2016-04-19 ENCOUNTER — Ambulatory Visit (INDEPENDENT_AMBULATORY_CARE_PROVIDER_SITE_OTHER): Payer: Medicare Other | Admitting: Family

## 2016-04-19 ENCOUNTER — Encounter: Payer: Self-pay | Admitting: Family

## 2016-04-19 ENCOUNTER — Telehealth: Payer: Self-pay | Admitting: Family

## 2016-04-19 VITALS — BP 180/107 | HR 68 | Temp 97.1°F | Ht 63.0 in | Wt 158.6 lb

## 2016-04-19 DIAGNOSIS — I10 Essential (primary) hypertension: Secondary | ICD-10-CM | POA: Diagnosis not present

## 2016-04-19 DIAGNOSIS — J069 Acute upper respiratory infection, unspecified: Secondary | ICD-10-CM | POA: Diagnosis not present

## 2016-04-19 MED ORDER — ONDANSETRON HCL 4 MG PO TABS: 4.0000 mg | ORAL_TABLET | Freq: Three times a day (TID) | ORAL | 0 refills | Status: DC | PRN

## 2016-04-19 NOTE — Progress Notes (Signed)
   Subjective:    Patient ID: Kaitlyn Serrano, female    DOB: 1941-02-07, 76 y.o.   MRN: [id number] HPI  PT presents to the office today to recheck URI. Pt was given a steroid dose pack and reports she is doing much better. Pt states she has an intermittent productive cough, and SOB with exertion. Denies fever. Pt's BP is elevated today. Pt states she has not taken her medication today.     Review of Systems  All other systems reviewed and are negative.      Objective:   Physical Exam  Constitutional: She is oriented to person, place, and time. She appears well-developed and well-nourished. No distress.  HENT:  Head: Normocephalic and atraumatic.  Right Ear: External ear normal.  Left Ear: External ear normal.  Nose: Nose normal.  Mouth/Throat: Oropharynx is clear and moist.  Eyes: Pupils are equal, round, and reactive to light.  Neck: Normal range of motion. Neck supple. No thyromegaly present.  Cardiovascular: Normal rate, regular rhythm, normal heart sounds and intact distal pulses.   No murmur heard. Pulmonary/Chest: Effort normal and breath sounds normal. No respiratory distress. She has no wheezes.  Abdominal: Soft. Bowel sounds are normal. She exhibits no distension. There is no tenderness.  Musculoskeletal: Normal range of motion. She exhibits no edema or tenderness.  Neurological: She is alert and oriented to person, place, and time.  Skin: Skin is warm and dry.  Psychiatric: She has a normal mood and affect. Her behavior is normal. Judgment and thought content normal.  Vitals reviewed.     BP (!) 180/107   Pulse 68   Temp 97.1 F (36.2 C) (Oral)   Ht 5\' 3"  (1.6 m)   Wt 158 lb 9.6 oz (71.9 kg)   BMI 28.09 kg/m      Assessment & Plan:  1. Essential hypertension -Pt to go home and take mediation!!!  2. Upper respiratory tract infection, unspecified type Resolved Force fluids RTO if symptoms worsen   Evelina Dun, FNP

## 2016-04-19 NOTE — Patient Instructions (Signed)
Hypertension Hypertension is another name for high blood pressure. High blood pressure forces your heart to work harder to pump blood. A blood pressure reading has two numbers, which includes a higher number over a lower number (example: 110/72). Follow these instructions at home:  Have your blood pressure rechecked by your doctor.  Only take medicine as told by your doctor. Follow the directions carefully. The medicine does not work as well if you skip doses. Skipping doses also puts you at risk for problems.  Do not smoke.  Monitor your blood pressure at home as told by your doctor. Contact a doctor if:  You think you are having a reaction to the medicine you are taking.  You have repeat headaches or feel dizzy.  You have puffiness (swelling) in your ankles.  You have trouble with your vision. Get help right away if:  You get a very bad headache and are confused.  You feel weak, numb, or faint.  You get chest or belly (abdominal) pain.  You throw up (vomit).  You cannot breathe very well. This information is not intended to replace advice given to you by your health care provider. Make sure you discuss any questions you have with your health care provider. Document Released: 07/31/2007 Document Revised: 07/20/2015 Document Reviewed: 12/04/2012 Elsevier Interactive Patient Education  2017 [employer]  

## 2016-04-22 ENCOUNTER — Telehealth: Payer: Self-pay | Admitting: Family

## 2016-04-22 MED ORDER — AZITHROMYCIN 250 MG PO TABS: ORAL_TABLET | ORAL | 0 refills | Status: DC

## 2016-04-22 NOTE — Telephone Encounter (Signed)
Zpak Prescription sent to pharmacy   

## 2016-04-22 NOTE — Telephone Encounter (Signed)
Spoke with pt Pt reports prod cough (green) and sore throat Wants antibiotic please advise

## 2016-04-22 NOTE — Telephone Encounter (Signed)
Pt aware abx sent to pharmacy

## 2016-04-22 NOTE — Telephone Encounter (Signed)
What symptoms do you have? Sore throat, chest cold  How long have you been sick? Since ov  Have you been seen for this problem? 04/19/16  If your provider decides to give you a prescription, which pharmacy would you like for it to be sent to? [facility]   Patient informed that this information will be sent to the clinical staff for review and that they should receive a follow up call.

## 2016-05-02 ENCOUNTER — Other Ambulatory Visit: Payer: Self-pay | Admitting: Family

## 2016-05-02 DIAGNOSIS — I10 Essential (primary) hypertension: Secondary | ICD-10-CM

## 2016-05-20 IMAGING — DX DG CHEST 2V
2 series · 2 of 2 positions shown · non-contrast
Comparison: 04/17/2014

CLINICAL DATA: Productive cough, shortness of breath, mild chest
pain for 2 days.

EXAM:
CHEST  2 VIEW

[chest pa]
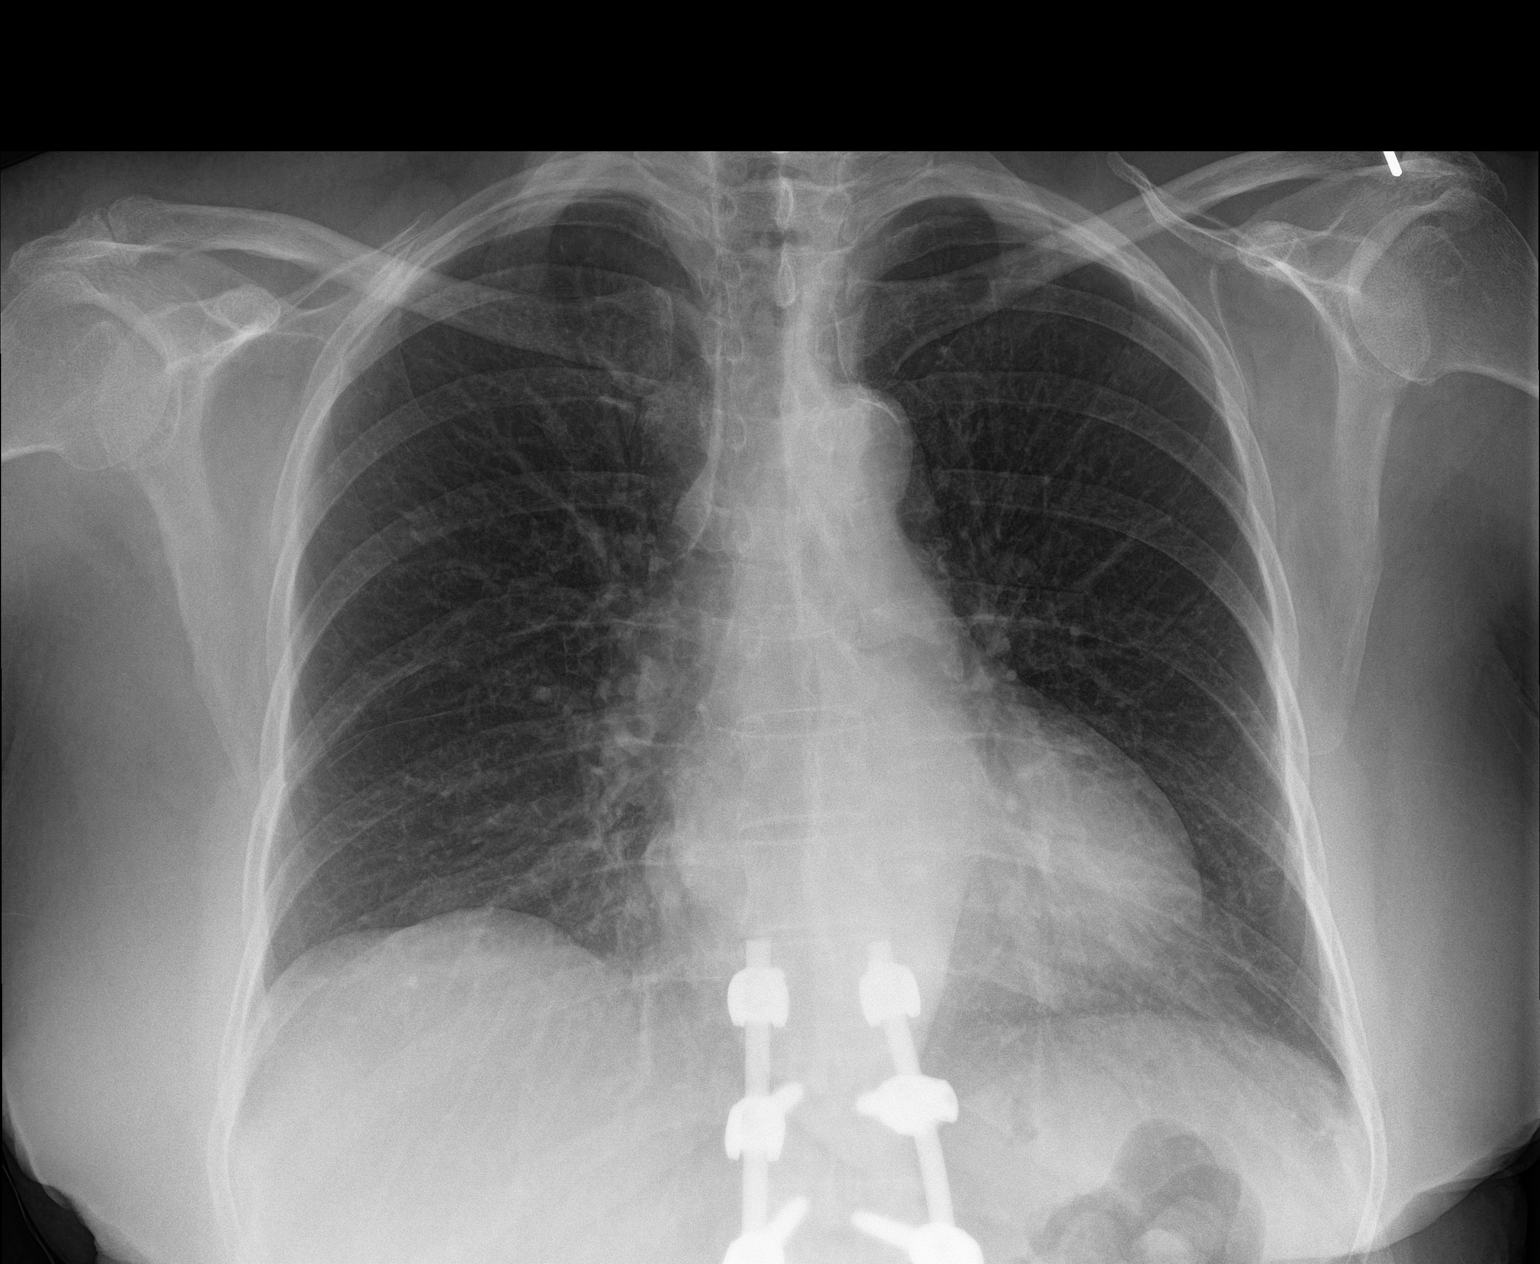

[chest lat]
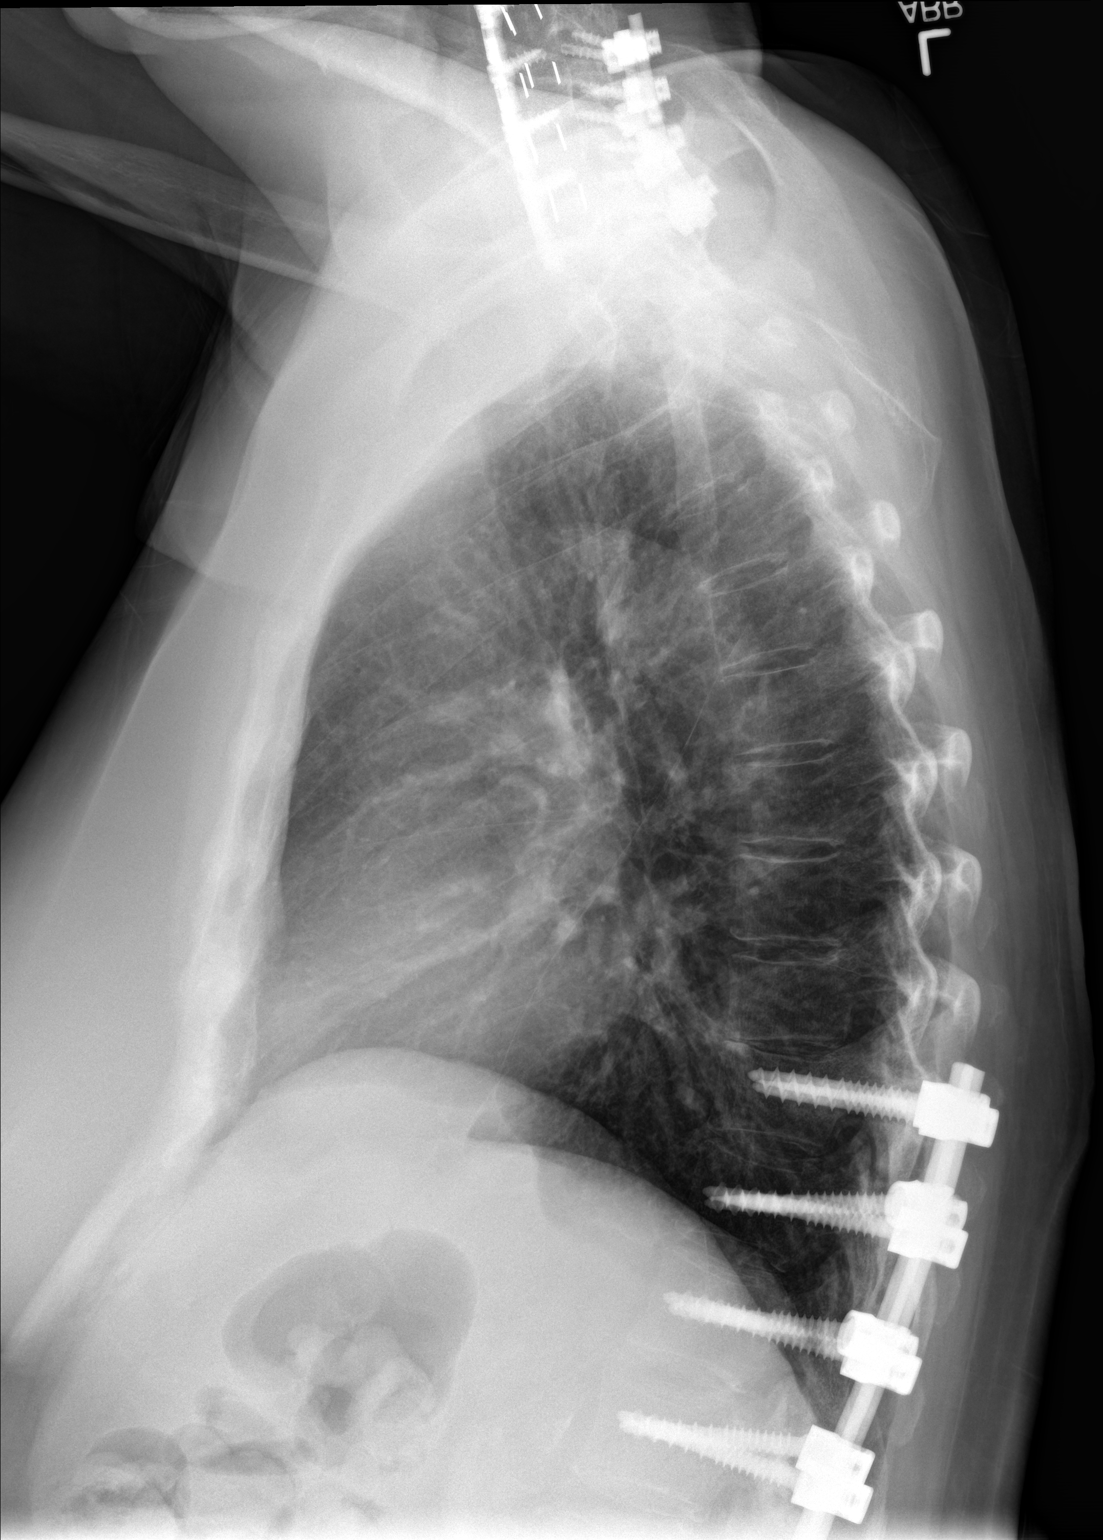

[2 of 2 positions shown; findings below may reference images not displayed]

FINDINGS: Postoperative changes in the cervical spine and thoracolumbar spine.
Normal heart size and pulmonary vascularity. No focal airspace
disease or consolidation in the lungs. No blunting of costophrenic
angles. No pneumothorax. Mediastinal contours appear intact.
Calcified and tortuous aorta.
IMPRESSION: No active cardiopulmonary disease.

## 2016-05-30 ENCOUNTER — Encounter: Payer: Self-pay | Admitting: Family

## 2016-05-30 ENCOUNTER — Ambulatory Visit (INDEPENDENT_AMBULATORY_CARE_PROVIDER_SITE_OTHER): Payer: Medicare Other | Admitting: Family

## 2016-05-30 VITALS — BP 144/85 | HR 70 | Temp 97.1°F | Ht 63.0 in | Wt 160.0 lb

## 2016-05-30 DIAGNOSIS — F331 Major depressive disorder, recurrent, moderate: Secondary | ICD-10-CM

## 2016-05-30 DIAGNOSIS — F411 Generalized anxiety disorder: Secondary | ICD-10-CM | POA: Diagnosis not present

## 2016-05-30 DIAGNOSIS — I10 Essential (primary) hypertension: Secondary | ICD-10-CM | POA: Diagnosis not present

## 2016-05-30 DIAGNOSIS — R7309 Other abnormal glucose: Secondary | ICD-10-CM

## 2016-05-30 MED ORDER — ESCITALOPRAM OXALATE 5 MG PO TABS: 5.0000 mg | ORAL_TABLET | Freq: Every day | ORAL | 1 refills | Status: DC

## 2016-05-30 MED ORDER — LOSARTAN POTASSIUM 50 MG PO TABS: 50.0000 mg | ORAL_TABLET | Freq: Every day | ORAL | 3 refills | Status: DC

## 2016-05-30 MED ORDER — BUSPIRONE HCL 7.5 MG PO TABS: 7.5000 mg | ORAL_TABLET | Freq: Three times a day (TID) | ORAL | 3 refills | Status: DC

## 2016-05-30 NOTE — Patient Instructions (Signed)
Stress and Stress Management Stress is a normal reaction to life events. It is what you feel when life demands more than you are used to or more than you can handle. Some stress can be useful. For example, the stress reaction can help you catch the last bus of the day, study for a test, or meet a deadline at work. But stress that occurs too often or for too long can cause problems. It can affect your emotional health and interfere with relationships and normal daily activities. Too much stress can weaken your immune system and increase your risk for physical illness. If you already have a medical problem, stress can make it worse. What are the causes? All sorts of life events may cause stress. An event that causes stress for one person may not be stressful for another person. Major life events commonly cause stress. These may be positive or negative. Examples include losing your job, moving into a new home, getting married, having a baby, or losing a loved one. Less obvious life events may also cause stress, especially if they occur day after day or in combination. Examples include working long hours, driving in traffic, caring for children, being in debt, or being in a difficult relationship. What are the signs or symptoms? Stress may cause emotional symptoms including, the following:  Anxiety. This is feeling worried, afraid, on edge, overwhelmed, or out of control.  Anger. This is feeling irritated or impatient.  Depression. This is feeling sad, down, helpless, or guilty.  Difficulty focusing, remembering, or making decisions. Stress may cause physical symptoms, including the following:  Aches and pains. These may affect your head, neck, back, stomach, or other areas of your body.  Tight muscles or clenched jaw.  Low energy or trouble sleeping. Stress may cause unhealthy behaviors, including the following:  Eating to feel better (overeating) or skipping meals.  Sleeping too little, too  much, or both.  Working too much or putting off tasks (procrastination).  Smoking, drinking alcohol, or using drugs to feel better. How is this diagnosed? Stress is diagnosed through an assessment by your health care provider. Your health care provider will ask questions about your symptoms and any stressful life events.Your health care provider will also ask about your medical history and may order blood tests or other tests. Certain medical conditions and medicine can cause physical symptoms similar to stress. Mental illness can cause emotional symptoms and unhealthy behaviors similar to stress. Your health care provider may refer you to a mental health professional for further evaluation. How is this treated? Stress management is the recommended treatment for stress.The goals of stress management are reducing stressful life events and coping with stress in healthy ways. Techniques for reducing stressful life events include the following:  Stress identification. Self-monitor for stress and identify what causes stress for you. These skills may help you to avoid some stressful events.  Time management. Set your priorities, keep a calendar of events, and learn to say "no." These tools can help you avoid making too many commitments. Techniques for coping with stress include the following:  Rethinking the problem. Try to think realistically about stressful events rather than ignoring them or overreacting. Try to find the positives in a stressful situation rather than focusing on the negatives.  Exercise. Physical exercise can release both physical and emotional tension. The key is to find a form of exercise you enjoy and do it regularly.  Relaxation techniques. These relax the body and mind. Examples include yoga,  meditation, tai chi, biofeedback, deep breathing, progressive muscle relaxation, listening to music, being out in nature, journaling, and other hobbies. Again, the key is to find one or  more that you enjoy and can do regularly.  Healthy lifestyle. Eat a balanced diet, get plenty of sleep, and do not smoke. Avoid using alcohol or drugs to relax.  Strong support network. Spend time with family, friends, or other people you enjoy being around.Express your feelings and talk things over with someone you trust. Counseling or talktherapy with a mental health professional may be helpful if you are having difficulty managing stress on your own. Medicine is typically not recommended for the treatment of stress.Talk to your health care provider if you think you need medicine for symptoms of stress. Follow these instructions at home:  Keep all follow-up visits as directed by your health care provider.  Take all medicines as directed by your health care provider. Contact a health care provider if:  Your symptoms get worse or you start having new symptoms.  You feel overwhelmed by your problems and can no longer manage them on your own. Get help right away if:  You feel like hurting yourself or someone else. This information is not intended to replace advice given to you by your health care provider. Make sure you discuss any questions you have with your health care provider. Document Released: 08/07/2000 Document Revised: 07/20/2015 Document Reviewed: 10/06/2012 Elsevier Interactive Patient Education  2017 [employer].

## 2016-05-30 NOTE — Progress Notes (Signed)
Subjective:    Patient ID: Kaitlyn Serrano, female    DOB: 09-29-1940, 76 y.o.   MRN: [id number]  PT presents to the office today with elevated HTN and depression. Pt's husband had a MI last month and feels stressed.  Hypertension  This is a chronic problem. The current episode started more than 1 year ago. The problem has been waxing and waning since onset. The problem is uncontrolled. Associated symptoms include malaise/fatigue and peripheral edema. Pertinent negatives include no headaches or shortness of breath. Past treatments include beta blockers. The current treatment provides mild improvement. There is no history of kidney disease, CAD/MI or heart failure.  Depression         This is a chronic problem.  The current episode started more than 1 year ago.   The onset quality is gradual.   The problem has been waxing and waning since onset.  Associated symptoms include irritable, restlessness and sad.  Associated symptoms include no helplessness, no decreased interest and no headaches.  Past treatments include nothing.     Review of Systems  Constitutional: Positive for malaise/fatigue.  Respiratory: Negative for shortness of breath.   Neurological: Negative for headaches.  Psychiatric/Behavioral: Positive for depression.  All other systems reviewed and are negative.      Objective:   Physical Exam  Constitutional: She is oriented to person, place, and time. She appears well-developed and well-nourished. She is irritable. No distress.  HENT:  Head: Normocephalic.  Eyes: Pupils are equal, round, and reactive to light.  Neck: Normal range of motion. Neck supple. No thyromegaly present.  Cardiovascular: Normal rate, regular rhythm, normal heart sounds and intact distal pulses.   No murmur heard. Pulmonary/Chest: Effort normal and breath sounds normal. No respiratory distress. She has no wheezes.  Abdominal: Soft. Bowel sounds are normal. She exhibits no distension. There is no  tenderness.  Musculoskeletal: Normal range of motion. She exhibits no edema or tenderness.  Neurological: She is alert and oriented to person, place, and time.  Skin: Skin is warm and dry.  Psychiatric: She has a normal mood and affect. Her behavior is normal. Judgment and thought content normal.  Vitals reviewed.     BP (!) 144/85   Pulse 70   Temp 97.1 F (36.2 C) (Oral)   Ht '5\' 3"'  (1.6 m)   Wt 160 lb (72.6 kg)   BMI 28.34 kg/m      Assessment & Plan:  1. Essential hypertension -Losartan 50 mg started today -Daily blood pressure log given with instructions on how to fill out and told to bring to next visit -Dash diet information given -Exercise encouraged - Stress Management  -Continue current meds -RTO in 2 weeks - CMP14+EGFR - losartan (COZAAR) 50 MG tablet; Take 1 tablet (50 mg total) by mouth daily.  Dispense: 90 tablet; Refill: 3  2. GAD (generalized anxiety disorder) -PT started on lexapro 5 mg today -Stress management discussed - CMP14+EGFR - escitalopram (LEXAPRO) 5 MG tablet; Take 1 tablet (5 mg total) by mouth daily.  Dispense: 90 tablet; Refill: 1 - busPIRone (BUSPAR) 7.5 MG tablet; Take 1 tablet (7.5 mg total) by mouth 3 (three) times daily.  Dispense: 60 tablet; Refill: 3  3. Moderate episode of recurrent major depressive disorder (HCC) -PT started on lexapro 5 mg today -Stress management discussed - CMP14+EGFR - escitalopram (LEXAPRO) 5 MG tablet; Take 1 tablet (5 mg total) by mouth daily.  Dispense: 90 tablet; Refill: [place], FNP

## 2016-05-31 LAB — CMP14+EGFR
ALBUMIN: 4 g/dL (ref 3.5–4.8)
ALT: 20 IU/L (ref 0–32)
AST: 21 IU/L (ref 0–40)
Albumin/Globulin Ratio: 1.5 (ref 1.2–2.2)
Alkaline Phosphatase: 92 IU/L (ref 39–117)
BUN / CREAT RATIO: 23 (ref 12–28)
BUN: 16 mg/dL (ref 8–27)
Bilirubin Total: 0.5 mg/dL (ref 0.0–1.2)
CALCIUM: 9.2 mg/dL (ref 8.7–10.3)
CO2: 28 mmol/L (ref 18–29)
CREATININE: 0.71 mg/dL (ref 0.57–1.00)
Chloride: 99 mmol/L (ref 96–106)
GFR calc non Af Amer: 84 mL/min/{1.73_m2} (ref >59)
GFR, EST AFRICAN AMERICAN: 96 mL/min/{1.73_m2} (ref >59)
GLOBULIN, TOTAL: 2.6 g/dL (ref 1.5–4.5)
Glucose: 128 mg/dL — ABNORMAL HIGH (ref 65–99)
Potassium: 3.7 mmol/L (ref 3.5–5.2)
SODIUM: 144 mmol/L (ref 134–144)
TOTAL PROTEIN: 6.6 g/dL (ref 6.0–8.5)

## 2016-05-31 LAB — BAYER DCA HB A1C WAIVED: HB A1C (BAYER DCA - WAIVED): 5.9 % (ref <7.0)

## 2016-05-31 NOTE — Addendum Note (Signed)
Addended by: Shelbie Ammons on: 05/31/2016 10:13 AM   Modules accepted: Orders

## 2016-06-06 ENCOUNTER — Ambulatory Visit (INDEPENDENT_AMBULATORY_CARE_PROVIDER_SITE_OTHER): Payer: Medicare Other | Admitting: Nurse Practitioner

## 2016-06-06 ENCOUNTER — Encounter: Payer: Self-pay | Admitting: Nurse Practitioner

## 2016-06-06 VITALS — BP 122/69 | HR 67 | Temp 98.0°F | Ht 63.0 in | Wt 159.0 lb

## 2016-06-06 DIAGNOSIS — I10 Essential (primary) hypertension: Secondary | ICD-10-CM | POA: Diagnosis not present

## 2016-06-06 DIAGNOSIS — S81801A Unspecified open wound, right lower leg, initial encounter: Secondary | ICD-10-CM | POA: Diagnosis not present

## 2016-06-06 NOTE — Patient Instructions (Signed)
How to Change Your Dressing A dressing is a material that is placed in and over wounds. A dressing helps your wound to heal by protecting it from:  Bacteria.  Worse injury.  Being too dry or too wet. What are the risks? The sticky (adhesive) tape that is used with a dressing may make your skin sore or irritated, or it may cause a rash. These are the most common problems. However, more serious problems can develop, such as:  Bleeding.  Infection. How to change your dressing Getting Ready to Change Your Dressing    Take a shower before you do the first dressing change of the day. If your doctor does not want your wound to get wet and your dressing is not waterproof, you may need to put plastic leak-proof sealing wrap on your dressing to protect it.  If needed, take pain medicine as told by your doctor 30 minutes before you change your dressing.  Set up a clean station for wound care. You will need:  A plastic trash bag that is open and ready to use.  Hand sanitizer.  Wound cleanser or salt-water solution (saline) as told by your doctor.  New dressing material or bandages. Make sure to open the dressing package so the dressing stays on the inside of the package. You may also need these supplies in your clean station:  A box of vinyl gloves.  Tape.  Skin protectant. This may be a wipe, film, or spray.  Clean or germ-free (sterile) scissors.  A cotton-tipped applicator. Taking Off Your Old Dressing   Wash your hands with soap and water. Dry your hands with a clean towel. If you cannot use soap and water, use hand sanitizer.  If you are using gloves, put on the gloves before you take off the dressing.  Gently take off any adhesive or tape by pulling it off in the direction of your hair growth. Only touch the outside edges of the dressing.  Take off the dressing. If the dressing sticks to your skin, wet the dressing with a germ-free salt-water solution. This helps it come  off more easily.  Take off any gauze or packing in your wound.  Throw the old dressing supplies into the ready trash bag.  Take off your gloves. To take off each glove, grab the cuff with your other hand and turn the glove inside out. Put the gloves in the trash right away.  Wash your hands with soap and water. Dry your hands with a clean towel. If you cannot use soap and water, use hand sanitizer. Cleaning Your Wound   Follow instructions from your doctor about how to clean your wound. This may include using a salt-water solution or recommended wound cleanser.  Do not use over-the-counter medicated or antiseptic creams, sprays, liquids, or dressings unless your doctor tells you to do that.  Use a clean gauze pad to clean the area fully with the salt-water solution or wound cleanser that your doctor recommends.  Throw the gauze pad into the trash bag.  Wash your hands with soap and water. Dry your hands with a clean towel. If you cannot use soap and water, use hand sanitizer. Putting on the Dressing   If your doctor recommended a skin protectant, put it on the skin around the wound.  Cover the wound with the recommended dressing, such as a nonstick gauze or bandage. Make sure to touch only the outside edges of the dressing. Do not touch the inside of the  dressing.  Attach the dressing so all sides stay in place. You may do this with the attached medical adhesive, roll gauze, or tape. If you use tape, do not wrap the tape all the way around your arm or leg.  Take off your gloves. Put them in the trash bag with the old dressing. Tie the bag shut and throw it away.  Wash your hands with soap and water. Dry your hands with a clean towel. If you cannot use soap and water, use hand sanitizer. Get help if:   You have new pain.  You have irritation, a rash, or itching around the wound or dressing.  Changing your dressing is painful.  Changing your dressing causes a lot of  bleeding. Get help right away if:  You have very bad pain.  You have signs of infection, such as:  More redness, swelling, or pain.  More fluid or blood.  Warmth.  Pus or a bad smell.  Red streaks leading from wound.  A fever. This information is not intended to replace advice given to you by your health care provider. Make sure you discuss any questions you have with your health care provider. Document Released: 05/10/2008 Document Revised: 07/20/2015 Document Reviewed: 11/17/2014 Elsevier Interactive Patient Education  2017 [employer].

## 2016-06-06 NOTE — Progress Notes (Signed)
   Subjective:    Patient ID: Kaitlyn Serrano, female    DOB: 06/06/1940, 76 y.o.   MRN: [id number]  HPI Patient is in today with c/o leg pain- she hit her leg on vacuum cleaner several days ago and it os still hurting - wants to make sure that it is okay. SHe also says that her blood pressure meds were changed by C. Hawks on 05/30/16 to losartan. Thinks that it has not helped with her blood pressure. Patient as on cartia and patient stopped taking even though he was not told to stop taking.    Review of Systems  Constitutional: Negative.   HENT: Negative.   Respiratory: Negative for shortness of breath.   Cardiovascular: Negative for chest pain, palpitations and leg swelling.  Gastrointestinal: Negative.   Genitourinary: Negative.   Musculoskeletal: Negative.   Neurological: Negative.   Psychiatric/Behavioral: Negative.   All other systems reviewed and are negative.      Objective:   Physical Exam  Constitutional: She is oriented to person, place, and time. She appears well-nourished.  Cardiovascular: Normal rate and regular rhythm.   Pulmonary/Chest: Effort normal and breath sounds normal.  Neurological: She is alert and oriented to person, place, and time.  Skin: Skin is warm.  3cm open superficial wound without sig of infection right lower shin  Psychiatric: She has a normal mood and affect. Her behavior is normal.   BP 122/69   Pulse 67   Temp 98 F (36.7 C) (Oral)   Ht 5\' 3"  (1.6 m)   Wt 159 lb (72.1 kg)   BMI 28.17 kg/m        Assessment & Plan:  1. Essential hypertension Blood pressure looks good today Continue losartan as rx Need to take cartia because that is more for heart rate Continue to watch blood pressures at home RTO prn   2. Wound of right lower extremity, initial encounter Clan with peroxid or antibacterial soap daily Triple antibiotic ointment Keep covered May take over a week to heal RTO prn  Mary-Margaret Hassell Done, FNP

## 2016-06-07 ENCOUNTER — Telehealth: Payer: Self-pay | Admitting: Family

## 2016-06-07 NOTE — Telephone Encounter (Signed)
Daughter coming by to pickup med list

## 2016-06-28 ENCOUNTER — Other Ambulatory Visit: Payer: Self-pay | Admitting: Family

## 2016-06-28 DIAGNOSIS — K5732 Diverticulitis of large intestine without perforation or abscess without bleeding: Secondary | ICD-10-CM

## 2016-07-01 ENCOUNTER — Other Ambulatory Visit: Payer: Self-pay | Admitting: Family Medicine

## 2016-07-19 ENCOUNTER — Ambulatory Visit: Payer: Medicare Other | Admitting: Nurse Practitioner

## 2016-07-25 ENCOUNTER — Telehealth: Payer: Self-pay | Admitting: Family

## 2016-07-25 NOTE — Telephone Encounter (Signed)
Spoke to pt Injuries from fall appt scheduled

## 2016-07-26 ENCOUNTER — Ambulatory Visit (HOSPITAL_COMMUNITY)
Admission: RE | Admit: 2016-07-26 | Discharge: 2016-07-26 | Disposition: A | Discharge status: Home / Self Care | Payer: Medicare Other | Source: Ambulatory Visit | Attending: Nurse Practitioner | Admitting: Nurse Practitioner

## 2016-07-26 ENCOUNTER — Encounter: Payer: Self-pay | Admitting: Nurse Practitioner

## 2016-07-26 ENCOUNTER — Ambulatory Visit (INDEPENDENT_AMBULATORY_CARE_PROVIDER_SITE_OTHER): Payer: Medicare Other | Admitting: Nurse Practitioner

## 2016-07-26 VITALS — BP 139/81 | HR 78 | Temp 97.1°F | Ht 63.0 in | Wt 158.0 lb

## 2016-07-26 DIAGNOSIS — X58XXXA Exposure to other specified factors, initial encounter: Secondary | ICD-10-CM | POA: Insufficient documentation

## 2016-07-26 DIAGNOSIS — S0990XA Unspecified injury of head, initial encounter: Secondary | ICD-10-CM

## 2016-07-26 NOTE — Progress Notes (Signed)
   Subjective:    Patient ID: Kaitlyn Serrano, female    DOB: September 10, 1940, 76 y.o.   MRN: [id number]  HPI  Patient comes in today, brought to office by her husband. SHe said she tripped over a bg in floor 5 days ago and hit her head on coffee table. Did not knock he rout, but she says iy almost did. SHe has had a headache every since with some nausea. Face is bruised.   Review of Systems  Constitutional: Negative for appetite change, chills and fever.  HENT: Negative.   Eyes: Negative.  Negative for visual disturbance.  Cardiovascular: Negative.   Genitourinary: Negative.   Neurological: Positive for headaches. Negative for dizziness, speech difficulty and weakness.  Psychiatric/Behavioral: Negative.        Objective:   Physical Exam  Constitutional: She is oriented to person, place, and time. She appears well-developed and well-nourished. No distress.  Cardiovascular: Normal rate and regular rhythm.   Pulmonary/Chest: Effort normal and breath sounds normal.  Neurological: She is alert and oriented to person, place, and time. She has normal reflexes. No cranial nerve deficit.  Skin: Skin is warm.  ecchymosis of both eyes and nose with scabbed over laceration to bridge of nose  Psychiatric: She has a normal mood and affect. Her behavior is normal. Judgment and thought content normal.   BP 140/81   Pulse 70   Temp 97.1 F (36.2 C) (Oral)   Ht 5\' 3"  (1.6 m)   Wt 158 lb (71.7 kg)   BMI 27.99 kg/m       Assessment & Plan:  1. Traumatic injury of head, initial encounter Rest Tylenol for hradache Will call with CT report  Mary-Margaret Hassell Done, FNP  - CT Head Wo Contrast

## 2016-07-26 NOTE — Patient Instructions (Signed)
Head Injury, Adult There are many types of head injuries. They can be as minor as a bump. Some head injuries can be worse. Worse injuries include:  A strong hit to the head that hurts the brain (concussion).  A bruise of the brain (contusion). This means there is bleeding in the brain that can cause swelling.  A cracked skull (skull fracture).  Bleeding in the brain that gathers, gets thick (makes a clot), and forms a bump (hematoma).  Most problems from a head injury come in the first 24 hours. However, you may still have side effects up to 7-10 days after your injury. It is important to watch your condition for any changes. Follow these instructions at home: Activity  Rest as much as possible.  Avoid activities that are hard or tiring.  Make sure you get enough sleep.  Limit activities that need a lot of thought or attention, such as: ? Watching TV. ? Playing memory games and puzzles. ? Job-related work or homework. ? Working on the computer, social media, and texting.  Avoid activities that could cause another head injury until your doctor says it is okay. This includes playing sports.  Ask your doctor when it is safe for you to go back to your normal activities, such as work or school. Ask your doctor for a step-by-step plan for slowly going back to your normal activities.  Ask your doctor when you can drive, ride a bicycle, or use heavy machinery. Never do these activities if you are dizzy.  Lifestyle  Do not drink alcohol until your doctor says it is okay.  Avoid drug use.  If it is harder than usual to remember things, write them down.  If you are easily distracted, try to do one thing at a time.  Talk with family members or close friends when making important decisions.  Tell your friends, family, a trusted coworker, and work manager about your injury, symptoms, and limits (restrictions). Have them watch for any problems that are new or getting worse.  General  instructions  Take over-the-counter and prescription medicines only as told by your doctor.  Have someone stay with you for 24 hours after your head injury. This person should watch you for any changes in your symptoms and be ready to get help.  Keep all follow-up visits as told by your doctor. This is important.  Prevention  Work on your balance and strength. This can help you avoid falls.  Wear a seatbelt when you are in a moving vehicle.  Wear a helmet when: ? Riding a bicycle. ? Skiing. ? Doing any other sport or activity that has a risk of injury.  Drink alcohol only in moderation.  Make your home safer by: ? Getting rid of clutter from the floors and stairs, like things that can make you trip. ? Using grab bars in bathrooms and handrails by stairs. ? Placing non-slip mats on floors and in bathtubs. ? Putting more light in dim areas. Get help right away if:  You have: ? A very bad (severe) headache that is not helped by medicine. ? Trouble walking or weakness in your arms and legs. ? Clear or bloody fluid coming from your nose or ears. ? Changes in your seeing (vision). ? Jerky movements that you cannot control (seizure).  You throw up (vomit).  Your symptoms get worse.  You lose balance.  Your speech is slurred.  You pass out.  You are sleepier and have trouble staying awake.    The black centers of your eyes (pupils) change in size. These symptoms may be an emergency. Do not wait to see if the symptoms will go away. Get medical help right away. Call your local emergency services (911 in the [place] Do not drive yourself to the hospital. This information is not intended to replace advice given to you by your health care provider. Make sure you discuss any questions you have with your health care provider. Document Released: 01/25/2008 Document Revised: 09/07/2015 Document Reviewed: 08/22/2015 Elsevier Interactive Patient Education  2017 [employer] Inc[employer]  

## 2016-08-02 ENCOUNTER — Telehealth: Payer: Self-pay | Admitting: Family

## 2016-08-02 NOTE — Telephone Encounter (Signed)
Deleted [facility] from pharmacy list.  Refills should go to [facility].

## 2016-08-06 ENCOUNTER — Other Ambulatory Visit: Payer: Self-pay | Admitting: *Deleted

## 2016-08-06 ENCOUNTER — Telehealth: Payer: Self-pay | Admitting: *Deleted

## 2016-08-06 ENCOUNTER — Other Ambulatory Visit: Payer: Self-pay | Admitting: Family

## 2016-08-06 MED ORDER — TRAZODONE HCL 50 MG PO TABS: 50.0000 mg | ORAL_TABLET | Freq: Every day | ORAL | 0 refills | Status: DC

## 2016-08-06 MED ORDER — ONDANSETRON HCL 4 MG PO TABS: 4.0000 mg | ORAL_TABLET | Freq: Three times a day (TID) | ORAL | 0 refills | Status: DC | PRN

## 2016-08-06 NOTE — Telephone Encounter (Signed)
She would like generic zofran called into [facility] please

## 2016-08-06 NOTE — Telephone Encounter (Signed)
zofran  Prescription sent to pharmacy   

## 2016-08-07 ENCOUNTER — Other Ambulatory Visit: Payer: Self-pay | Admitting: Orthopaedic Surgery

## 2016-08-07 DIAGNOSIS — M4326 Fusion of spine, lumbar region: Secondary | ICD-10-CM

## 2016-08-07 DIAGNOSIS — G894 Chronic pain syndrome: Secondary | ICD-10-CM

## 2016-08-12 ENCOUNTER — Ambulatory Visit
Admission: RE | Admit: 2016-08-12 | Discharge: 2016-08-12 | Disposition: A | Discharge status: Home / Self Care | Payer: Medicare Other | Source: Ambulatory Visit | Attending: Orthopaedic Surgery | Admitting: Orthopaedic Surgery

## 2016-08-12 ENCOUNTER — Other Ambulatory Visit: Payer: Self-pay

## 2016-08-12 ENCOUNTER — Other Ambulatory Visit (HOSPITAL_COMMUNITY): Payer: Self-pay

## 2016-08-12 DIAGNOSIS — G894 Chronic pain syndrome: Secondary | ICD-10-CM

## 2016-08-12 DIAGNOSIS — M4326 Fusion of spine, lumbar region: Secondary | ICD-10-CM

## 2016-09-10 ENCOUNTER — Other Ambulatory Visit: Payer: Self-pay | Admitting: Family

## 2016-09-10 ENCOUNTER — Ambulatory Visit: Payer: Medicare Other | Admitting: Family Medicine

## 2016-09-11 NOTE — Telephone Encounter (Signed)
[facility] seen 07/26/16  [name]  If approved route to nurse to call into [facility]

## 2016-09-12 NOTE — Telephone Encounter (Signed)
rx called into pharmacy

## 2016-09-28 ENCOUNTER — Other Ambulatory Visit: Payer: Self-pay | Admitting: Physician Assistant

## 2016-10-31 ENCOUNTER — Telehealth: Payer: Self-pay | Admitting: Family

## 2016-10-31 NOTE — Telephone Encounter (Signed)
Patient c/o brownish/green mucus with coughing. They do not know if she has a fever. Patient is having chills. Been going on about 2-3 days. Patient is drinking fluids and staying hydrated, also patient is eating. Patient has not tried any medication otc. Please advise. Patient uses [facility].

## 2016-10-31 NOTE — Telephone Encounter (Signed)
Pt ntbs

## 2016-10-31 NOTE — Telephone Encounter (Signed)
Patient informed she will need to be seen.  She said she cannot come this afternoon and will call back tomorrow.

## 2016-11-01 ENCOUNTER — Ambulatory Visit (INDEPENDENT_AMBULATORY_CARE_PROVIDER_SITE_OTHER): Payer: Medicare Other | Admitting: Family Medicine

## 2016-11-01 ENCOUNTER — Encounter: Payer: Self-pay | Admitting: Family Medicine

## 2016-11-01 VITALS — BP 135/74 | HR 78 | Temp 97.3°F | Ht 63.0 in | Wt 160.0 lb

## 2016-11-01 DIAGNOSIS — J0110 Acute frontal sinusitis, unspecified: Secondary | ICD-10-CM | POA: Diagnosis not present

## 2016-11-01 DIAGNOSIS — R358 Other polyuria: Secondary | ICD-10-CM | POA: Diagnosis not present

## 2016-11-01 DIAGNOSIS — R631 Polydipsia: Secondary | ICD-10-CM

## 2016-11-01 DIAGNOSIS — R3589 Other polyuria: Secondary | ICD-10-CM

## 2016-11-01 LAB — BMP8+EGFR
BUN/Creatinine Ratio: 18 (ref 12–28)
BUN: 12 mg/dL (ref 8–27)
CO2: 26 mmol/L (ref 20–29)
Calcium: 9.5 mg/dL (ref 8.7–10.3)
Chloride: 101 mmol/L (ref 96–106)
Creatinine, Ser: 0.67 mg/dL (ref 0.57–1.00)
GFR calc Af Amer: 99 mL/min/{1.73_m2} (ref >59)
GFR calc non Af Amer: 86 mL/min/{1.73_m2} (ref >59)
Glucose: 94 mg/dL (ref 65–99)
Potassium: 4.8 mmol/L (ref 3.5–5.2)
Sodium: 141 mmol/L (ref 134–144)

## 2016-11-01 MED ORDER — DOXYCYCLINE HYCLATE 100 MG PO TABS: 100.0000 mg | ORAL_TABLET | Freq: Two times a day (BID) | ORAL | 0 refills | Status: DC

## 2016-11-01 NOTE — Patient Instructions (Signed)
It appears that you have a viral upper respiratory infection.  I am concerned that this may be a sinus infection.  Because she has an allergy to penicillin, I prescribed doxycycline. He will take 1 tablet twice a day for the next 7 days. Make sure to take this medication with a meal, as it can cause some nausea and GI upset.  I will contact you will the results of your labs.  If anything is abnormal, I will call you.  Otherwise, expect a copy to be mailed to you.   - Get plenty of rest and drink plenty of fluids. - Try to breathe moist air. Use a humidifier or take a steamy shower. - Consume warm fluids (soup or tea) to provide relief for a stuffy nose and to loosen phlegm. - For nasal stuffiness, try saline nasal spray or a Neti Pot. - For sore throat pain relief: suck on throat lozenges, hard candy or popsicles; gargle with warm salt water (1/4 tsp. salt per 8 oz. of water); and eat soft, bland foods. - Eat a well-balanced diet. If you cannot, ensure you are getting enough nutrients by taking a daily multivitamin. - Avoid dairy products, as they can thicken phlegm. - Avoid alcohol, as it impairs your body's immune system.  CONTACT YOUR DOCTOR IF YOU EXPERIENCE ANY OF THE FOLLOWING: - High fever - Ear pain - Sinus-type headache - Unusually severe cold symptoms - Cough that gets worse while other cold symptoms improve - Flare up of any chronic lung problem, such as asthma - Your symptoms persist longer than 2 weeks

## 2016-11-01 NOTE — Progress Notes (Signed)
Subjective: CC: cough, excessive thirst PCP: Sharion Balloon, FNP Kaitlyn Serrano is a 76 y.o. female is accompanied to visit by her daughter. She is presenting to clinic today for:  1. Cough Patient reports about a 1-1/2 week history of cough that is productive, phlegm is green and brown. She reports associated congestion, chills and subjective fevers. She reports rhinorrhea and sinus headache. She notes that breathing is a little harder than normal. She has no history of underlying lung disease including COPD or asthma. She uses her albuterol with no improvement. Of note, she reports she was hospitalized for about 3 days 1 month ago for back surgery.  2. Excessive thirst Patient reports that she has had excessive thirst for several years. She notes that excessive thirst seems to be worse over the last couple of weeks. She reports associated polyuria. No unplanned weight loss or weight gain. Her diet is heavy in carbohydrates. Her physical activity is limited by pain from her back. There is a family history of diabetes. She is worried that she may have diabetes. She would like to be tested for this today.  Allergies  Allergen Reactions  . Bee Venom Anaphylaxis  . Amlodipine     swelling  . Elavil [Amitriptyline Hcl] Other (See Comments)    confusion  . Statins Swelling and Other (See Comments)    Peeling of skin  . Penicillins Rash    To mouth   Past Medical History:  Diagnosis Date  . Asthma   . Blood transfusion   . Blurred vision   . CAD (coronary artery disease)   . Chest pain   . CHF (congestive heart failure) ([place])   . Chronic pain syndrome   . Cough   . Cyanosis   . Diabetes ([place])   . Diverticulosis   . Double vision   . 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 Dysphagia   . Dyspnea   . Exposure to TB   . Facial pain   . Generalized weakness   . GERD (gastroesophageal reflux disease)   . GERD (gastroesophageal reflux disease)   . Glaucoma   . Hearing loss     . Heart murmur   . Hemorrhoids   . Hemorrhoids   . High cholesterol   . Hoarseness   . Hx of cardiovascular stress test    negative 2012 and 2016  . Hypertension   . IBS (irritable bowel syndrome)   . Insomnia   . Intervertebral disc disorder with myelopathy, lumbar region   . Irregular heart beat   . Leg swelling   . Lumbago   . Lumbar spine pain   . MI (myocardial infarction) ([place])   . Nasal congestion   . Night sweats   . Palpitations   . Rectal bleeding   . Renal insufficiency   . Ringing in ears   . Sinus infection   . Spondylosis with myelopathy, lumbar region   . Syncope   . Valvular heart disease   . Vertigo   . Vision loss   . Visual disorder   . Wheezing    Family History  Problem Relation Age of Onset  . Diabetes Mother   . Heart disease Mother   . Hypertension Mother   . Gallbladder disease Mother   . Heart disease Father   . Pneumonia Sister   . Hypertension Brother   . Hyperlipidemia Brother   . Gallbladder disease Unknown   . Hypertension Child  Social Hx: never smoker.Current medications reviewed.   ROS: Per HPI  Objective: Office vital signs reviewed. BP 135/74 (BP Location: Right Arm)   Pulse 78   Temp (!) 97.3 F (36.3 C) (Oral)   Ht _0  (1.6 m)   Wt 160 lb (72.6 kg)   BMI 28.34 kg/m   Physical Examination:  General: Awake, alert, overweight, No acute distress HEENT: +TTP to frontal and maxillary sinuses    Neck: No masses palpated. No lymphadenopathy    Ears: Tympanic membranes intact, normal light reflex, no erythema, no bulging    Eyes: PERRLA, extraocular membranes intact, sclera white    Nose: nasal turbinates moist, clear nasal discharge    Throat: moist mucus membranes, no erythema, no tonsillar exudate.  Airway is patent Cardio: regular rate and rhythm, S1S2 heard, no murmurs appreciated Pulm: clear to auscultation bilaterally with good air movement throughout. no wheezes, rhonchi or rales; normal work of breathing on  room air  Assessment/ Plan: 76 y.o. female   1. Acute non-recurrent frontal sinusitis She is afebrile here w/ stable vital signs.  Given report of subjective fevers, sinus headache and sinus tenderness to palpation on exam, will empirically treat for bacterial sinus infection. She did have a recent hospitalization. There are no focal findings on her lung exam. It is possible that she has pneumonia but this is lower on my differential at this time. She is penicillin allergic, therefore doxycycline was prescribed to her today. We reviewed how to take this medication. I also recommended plain Mucinex over-the-counter for chest congestion. Home care instructions were reviewed with patient, see AVS. Strict return precautions were reviewed with patient and her daughter today. They voiced good understanding of plan.  2. Polydipsia Patient is worried that she may have diabetes. She certainly has the body habitus and lifestyle that puts her at increased risk for this. She also has a family history of type 2 diabetes. Will check fasting glucose and renal function. We'll call with results. - BMP8+EGFR  3. Polyuria Nothing to suggest urinary tract infection at this time. We will evaluate for possible diabetes. - BMP8+EGFR  Follow up with PCP prn or as scheduled for routine care.  Orders Placed This Encounter  Procedures  . BMP8+EGFR   Meds ordered this encounter  Medications  . doxycycline (VIBRA-TABS) 100 MG tablet    Sig: Take 1 tablet (100 mg total) by mouth 2 (two) times daily. (Take with a meal)    Dispense:  14 tablet    Refill:  0     Ashly Windell Moulding, DO [place] [phone]

## 2016-11-11 ENCOUNTER — Telehealth: Payer: Self-pay | Admitting: Family Medicine

## 2016-11-11 NOTE — Telephone Encounter (Signed)
Patient was seen on 11/01/16 and treated with Doxycycline for sinusitis.  Patient is still having nasal and chest congestion and would like to know if you will send in Cipro to [facility], "it always helps her."

## 2016-11-11 NOTE — Telephone Encounter (Signed)
Please have patient follow up in office w/ PCP or myself if she has persistent symptoms.  She needs to be reevaluated.

## 2016-11-11 NOTE — Telephone Encounter (Signed)
Left detailed message for pt to call and schedule appt

## 2016-11-15 ENCOUNTER — Ambulatory Visit: Payer: Medicare Other | Admitting: Pediatrics

## 2016-11-18 ENCOUNTER — Encounter: Payer: Self-pay | Admitting: Family

## 2016-11-19 ENCOUNTER — Telehealth: Payer: Self-pay | Admitting: Pediatrics

## 2016-11-19 MED ORDER — LEVOFLOXACIN 500 MG PO TABS: 500.0000 mg | ORAL_TABLET | Freq: Every day | ORAL | 0 refills | Status: DC

## 2016-11-19 NOTE — Telephone Encounter (Signed)
Patient aware of recommendations.  

## 2016-11-19 NOTE — Telephone Encounter (Signed)
Will send in Levaquin Prescription  to pharmacy. Pt will need to follow up if symptoms do not resolve

## 2016-11-19 NOTE — Telephone Encounter (Signed)
Will defer this to PCP 

## 2016-11-24 ENCOUNTER — Emergency Department (HOSPITAL_COMMUNITY)
Admission: EM | Admit: 2016-11-24 | Discharge: 2016-11-24 | Disposition: A | Discharge status: Home / Self Care | Payer: Medicare Other | Attending: Emergency Medicine | Admitting: Emergency Medicine

## 2016-11-24 ENCOUNTER — Encounter (HOSPITAL_COMMUNITY): Payer: Self-pay | Admitting: Cardiology

## 2016-11-24 ENCOUNTER — Emergency Department (HOSPITAL_COMMUNITY): Payer: Medicare Other

## 2016-11-24 DIAGNOSIS — Z79899 Other long term (current) drug therapy: Secondary | ICD-10-CM | POA: Insufficient documentation

## 2016-11-24 DIAGNOSIS — X58XXXA Exposure to other specified factors, initial encounter: Secondary | ICD-10-CM | POA: Insufficient documentation

## 2016-11-24 DIAGNOSIS — E119 Type 2 diabetes mellitus without complications: Secondary | ICD-10-CM | POA: Diagnosis not present

## 2016-11-24 DIAGNOSIS — J449 Chronic obstructive pulmonary disease, unspecified: Secondary | ICD-10-CM | POA: Diagnosis not present

## 2016-11-24 DIAGNOSIS — I110 Hypertensive heart disease with heart failure: Secondary | ICD-10-CM | POA: Diagnosis not present

## 2016-11-24 DIAGNOSIS — J45909 Unspecified asthma, uncomplicated: Secondary | ICD-10-CM | POA: Insufficient documentation

## 2016-11-24 DIAGNOSIS — Y999 Unspecified external cause status: Secondary | ICD-10-CM | POA: Diagnosis not present

## 2016-11-24 DIAGNOSIS — I509 Heart failure, unspecified: Secondary | ICD-10-CM | POA: Diagnosis not present

## 2016-11-24 DIAGNOSIS — Y929 Unspecified place or not applicable: Secondary | ICD-10-CM | POA: Diagnosis not present

## 2016-11-24 DIAGNOSIS — S46911A Strain of unspecified muscle, fascia and tendon at shoulder and upper arm level, right arm, initial encounter: Secondary | ICD-10-CM | POA: Diagnosis not present

## 2016-11-24 DIAGNOSIS — Y939 Activity, unspecified: Secondary | ICD-10-CM | POA: Diagnosis not present

## 2016-11-24 DIAGNOSIS — R682 Dry mouth, unspecified: Secondary | ICD-10-CM | POA: Insufficient documentation

## 2016-11-24 DIAGNOSIS — S4991XA Unspecified injury of right shoulder and upper arm, initial encounter: Secondary | ICD-10-CM | POA: Diagnosis present

## 2016-11-24 LAB — CBC WITH DIFFERENTIAL/PLATELET
Basophils Absolute: 0 10*3/uL (ref 0.0–0.1)
Basophils Relative: 0 %
EOS PCT: 5 %
Eosinophils Absolute: 0.3 10*3/uL (ref 0.0–0.7)
HEMATOCRIT: 41.4 % (ref 36.0–46.0)
HEMOGLOBIN: 14 g/dL (ref 12.0–15.0)
LYMPHS ABS: 1.2 10*3/uL (ref 0.7–4.0)
LYMPHS PCT: 16 %
MCH: 30.2 pg (ref 26.0–34.0)
MCHC: 33.8 g/dL (ref 30.0–36.0)
MCV: 89.4 fL (ref 78.0–100.0)
Monocytes Absolute: 0.8 10*3/uL (ref 0.1–1.0)
Monocytes Relative: 11 %
NEUTROS PCT: 68 %
Neutro Abs: 4.9 10*3/uL (ref 1.7–7.7)
Platelets: 212 10*3/uL (ref 150–400)
RBC: 4.63 MIL/uL (ref 3.87–5.11)
RDW: 12.9 % (ref 11.5–15.5)
WBC: 7.2 10*3/uL (ref 4.0–10.5)

## 2016-11-24 LAB — BASIC METABOLIC PANEL
Anion gap: 10 (ref 5–15)
BUN: 17 mg/dL (ref 6–20)
CHLORIDE: 101 mmol/L (ref 101–111)
CO2: 28 mmol/L (ref 22–32)
Calcium: 9.2 mg/dL (ref 8.9–10.3)
Creatinine, Ser: 0.65 mg/dL (ref 0.44–1.00)
GFR calc Af Amer: >60 mL/min (ref >60)
GFR calc non Af Amer: >60 mL/min (ref >60)
Glucose, Bld: 114 mg/dL — ABNORMAL HIGH (ref 65–99)
POTASSIUM: 3.5 mmol/L (ref 3.5–5.1)
SODIUM: 139 mmol/L (ref 135–145)

## 2016-11-24 LAB — MAGNESIUM: Magnesium: 1.7 mg/dL (ref 1.7–2.4)

## 2016-11-24 NOTE — ED Provider Notes (Signed)
[facility] DEPT Provider Note   CSN: [id number] Arrival date & time: 11/24/16  0850     History   Chief Complaint Chief Complaint  Patient presents with  . Extremity Pain    HPI Kaitlyn Serrano is a 75 y.o. female.  HPI  76 year old female presents with 3 days of right arm pain. No trauma. She does not remember any type of injury at all. The pain is in her upper arm diffusely but she would call the shoulder the worst part of it. It is hard to get out of bed because she has a hard time pushing off of her arm. There is no weakness in her extremity or numbness. No neck pain or stiffness. No pain with ranging her neck. No headache. She denies any chest pain. She states she has chronic shortness of breath that is not worse than typical. She recently had back surgery and is currently on hydrocodone but states this has not seemed to help much. She also thinks her potassium is low. She states that she has had a dry mouth for about one week. This is a typical symptom for her of low potassium. She has not had any recent changes in her medicines.  Past Medical History:  Diagnosis Date  . Asthma   . Blood transfusion   . Blurred vision   . CAD (coronary artery disease)   . Chest pain   . CHF (congestive heart failure) ([place])   . Chronic pain syndrome   . Cough   . Cyanosis   . Diabetes ([place])   . Diverticulosis   . Double vision   . 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 Dysphagia   . Dyspnea   . Exposure to TB   . Facial pain   . Generalized weakness   . GERD (gastroesophageal reflux disease)   . GERD (gastroesophageal reflux disease)   . Glaucoma   . Hearing loss   . Heart murmur   . Hemorrhoids   . Hemorrhoids   . High cholesterol   . Hoarseness   . Hx of cardiovascular stress test    negative 2012 and 2016  . Hypertension   . IBS (irritable bowel syndrome)   . Insomnia   . Intervertebral disc disorder with myelopathy, lumbar region   . Irregular heart beat   . Leg  swelling   . Lumbago   . Lumbar spine pain   . MI (myocardial infarction) ([place])   . Nasal congestion   . Night sweats   . Palpitations   . Rectal bleeding   . Renal insufficiency   . Ringing in ears   . Sinus infection   . Spondylosis with myelopathy, lumbar region   . Syncope   . Valvular heart disease   . Vertigo   . Vision loss   . Visual disorder   . Wheezing     Patient Active Problem List   Diagnosis Date Noted  . Constipation 01/16/2015  . Insomnia 01/16/2015  . GAD (generalized anxiety disorder) 10/08/2013  . Vitamin D deficiency 10/08/2013  . GERD (gastroesophageal reflux disease) 10/08/2013  . Hypertension 08/13/2012  . Hyperlipidemia 08/13/2012  . Seasonal allergic rhinitis 06/30/2012  . Hemorrhoids 06/25/2011    Past Surgical History:  Procedure Laterality Date  . ABDOMINAL HYSTERECTOMY    . BACK SURGERY    . BONE MARROW ASPIRATION    . CERVICAL SPINE SURGERY    . FIXATION KYPHOPLASTY    . LAMINOTOMY    .  SPINAL FUSION      OB History    Gravida Para Term Preterm AB Living   4 3 3  0 1     SAB TAB Ectopic Multiple Live Births   0 0 1           Home Medications    Prior to Admission medications   Medication Sig Start Date End Date Taking? Authorizing Provider  albuterol (PROVENTIL HFA;VENTOLIN HFA) 108 (90 Base) MCG/ACT inhaler Inhale 2 puffs into the lungs every 6 (six) hours as needed for wheezing or shortness of breath. 04/09/16   Terald Sleeper, PA-C  ALPRAZolam Duanne Moron) 0.5 MG tablet TAKE 1/2 TABLET TWICE DAILY AS NEEDED FOR SLEEP OR ANXIETY 09/12/16   Evelina Dun A, FNP  atenolol (TENORMIN) 50 MG tablet TAKE ONE TABLET BY MOUTH ONCE DAILY 05/02/16   Evelina Dun A, FNP  busPIRone (BUSPAR) 7.5 MG tablet Take 1 tablet (7.5 mg total) by mouth 3 (three) times daily. Patient not taking: Reported on 11/01/2016 05/30/16   Evelina Dun A, FNP  CARTIA XT 120 MG 24 hr capsule TAKE ONE CAPSULE BY MOUTH ONCE DAILY 05/02/16   Evelina Dun A, FNP    cyclobenzaprine (FLEXERIL) 10 MG tablet Take 1 tablet (10 mg total) by mouth 2 (two) times daily as needed for muscle spasms. 04/27/15   Evelina Dun A, FNP  dicyclomine (BENTYL) 10 MG capsule TAKE  (1)  CAPSULE  TWICE DAILY. 09/30/16   Ladene Artist, MD  doxycycline (VIBRA-TABS) 100 MG tablet Take 1 tablet (100 mg total) by mouth 2 (two) times daily. (Take with a meal) 11/01/16   Ronnie Doss M, DO  EPINEPHrine 0.3 mg/0.3 mL IJ SOAJ injection Inject 0.3 mLs (0.3 mg total) into the muscle once. 10/06/13   Lysbeth Penner, FNP  escitalopram (LEXAPRO) 5 MG tablet Take 1 tablet (5 mg total) by mouth daily. 05/30/16   Evelina Dun A, FNP  FEMRING 0.05 MG/24HR RING INSERT (1) INTO THE VAGINA AS DIR- ECTED. -FOR VAGINAL USE- 07/02/16   Hassell Done, Mary-Margaret, FNP  fluticasone (FLONASE) 50 MCG/ACT nasal spray Place 2 sprays into both nostrils daily. 03/21/16   Sharion Balloon, FNP  furosemide (LASIX) 40 MG tablet TAKE ONE TABLET BY MOUTH ONCE DAILY 05/02/16   Evelina Dun A, FNP  HYDROcodone-acetaminophen (NORCO) 10-325 MG tablet Take 1 tablet by mouth every 8 (eight) hours as needed for moderate pain or severe pain.     [provider]  lactulose (CHRONULAC) 10 GM/15ML solution Take 15 mLs (10 g total) by mouth 2 (two) times daily. 04/27/15   Sharion Balloon, FNP  levofloxacin (LEVAQUIN) 500 MG tablet Take 1 tablet (500 mg total) by mouth daily. 11/19/16   Sharion Balloon, FNP  ondansetron (ZOFRAN) 4 MG tablet Take 1 tablet (4 mg total) by mouth every 8 (eight) hours as needed for nausea or vomiting. 08/06/16   Evelina Dun A, FNP  pantoprazole (PROTONIX) 40 MG tablet TAKE ONE TABLET BY MOUTH TWICE DAILY 04/08/16   Evelina Dun A, FNP  polyethylene glycol powder (GLYCOLAX/MIRALAX) powder Take 17 g by mouth 2 (two) times daily as needed. 06/28/16   Sharion Balloon, FNP  traZODone (DESYREL) 50 MG tablet Take 1 tablet (50 mg total) by mouth at bedtime. 08/06/16   Evelina Dun A, FNP  VOLTAREN 1 %  GEL Apply 4 g topically as needed.  01/22/16   [provider]    Family History Family History  Problem Relation Age of  Onset  . Diabetes Mother   . Heart disease Mother   . Hypertension Mother   . Gallbladder disease Mother   . Heart disease Father   . Pneumonia Sister   . Hypertension Brother   . Hyperlipidemia Brother   . Gallbladder disease Unknown   . Hypertension Child     Social History Social History  Substance Use Topics  . Smoking status: Former Smoker    Packs/day: 4.00    Years: 3.00    Types: Cigarettes    Start date: 06/10/1960    Quit date: 02/20/1964  . Smokeless tobacco: Never Used  . Alcohol use No     Allergies   Bee venom; Amlodipine; Elavil [amitriptyline hcl]; Statins; and Penicillins   Review of Systems Review of Systems  Constitutional: Negative for fever.  Respiratory: Negative for shortness of breath.   Cardiovascular: Negative for chest pain.  Endocrine:       Dry mouth  Genitourinary: Negative for dysuria.  Musculoskeletal: Positive for arthralgias. Negative for neck pain.  Neurological: Negative for weakness, numbness and headaches.  All other systems reviewed and are negative.    Physical Exam Updated Vital Signs BP (!) 135/99 (BP Location: Left Arm)   Pulse 80   Temp 97.6 F (36.4 C) (Oral)   Resp 19   Ht 5\' 2"  (1.575 m)   Wt 72.6 kg (160 lb)   SpO2 97%   BMI 29.26 kg/m   Physical Exam  Constitutional: She is oriented to person, place, and time. She appears well-developed and well-nourished.  HENT:  Head: Normocephalic and atraumatic.  Right Ear: External ear normal.  Left Ear: External ear normal.  Nose: Nose normal.  Eyes: Right eye exhibits no discharge. Left eye exhibits no discharge.  Neck: Normal range of motion. Neck supple.  Cardiovascular: Normal rate, regular rhythm and normal heart sounds.   Pulses:      Radial pulses are 2+ on the right side, and 2+ on the left side.  Pulmonary/Chest: Effort  normal and breath sounds normal.  Abdominal: Soft. There is no tenderness.  Musculoskeletal:       Right shoulder: She exhibits decreased range of motion (mild, difficult to range without pain past 90 degrees) and tenderness. She exhibits no swelling and no deformity.       Right elbow: She exhibits normal range of motion. No tenderness found.       Right upper arm: She exhibits no tenderness.  Neurological: She is alert and oriented to person, place, and time.  5/5 strength in BUE. Normal gross sensation.  Skin: Skin is warm and dry.  Nursing note and vitals reviewed.    ED Treatments / Results  Labs (all labs ordered are listed, but only abnormal results are displayed) Labs Reviewed  BASIC METABOLIC PANEL - Abnormal; Notable for the following:       Result Value   Glucose, Bld 114 (*)    All other components within normal limits  CBC WITH DIFFERENTIAL/PLATELET  MAGNESIUM    EKG  EKG Interpretation  Date/Time:  Sunday November 24 2016 09:59:11 EDT Ventricular Rate:  71 PR Interval:  216 QRS Duration: 84 QT Interval:  404 QTC Calculation: 439 R Axis:   -33 Text Interpretation:  Sinus rhythm with 1st degree A-V block Left axis deviation Cannot rule out Anterior infarct , age undetermined Abnormal ECG no significant change since Oct 2017 Confirmed by Sherwood Gambler [phone]) on 11/24/2016 10:02:39 AM       Radiology Dg  Shoulder Right  Result Date: 11/24/2016 CLINICAL DATA:  No injury, severe pain right shoulder for 5-6 days EXAM: RIGHT SHOULDER - 2+ VIEW COMPARISON:  Chest x-ray 12/09/2015 FINDINGS: There is no acute fracture or subluxation. Right lung apex is unremarkable. Previous anterior and posterior cervical fusion. IMPRESSION: No evidence for acute  abnormality. Electronically Signed   By: Nolon Nations M.D.   On: 11/24/2016 10:03    Procedures Procedures (including critical care time)  Medications Ordered in ED Medications - No data to display   Initial  Impression / Assessment and Plan / ED Course  I have reviewed the triage vital signs and the nursing notes.  Pertinent labs & imaging results that were available during my care of the patient were reviewed by me and considered in my medical decision making (see chart for details).     Shoulder pain is likely muscular. X-ray unremarkable. Neurovascularly intact. No neck pain. I highly doubt atypical ACS. Her electrolytes are unremarkable, her dry mouth is likely more medication related as she is on multiple medicines such as Xanax, Flexeril, and Bentyl that could cause dry mouth. However otherwise is well appearing. Discharge home with return precautions.  Final Clinical Impressions(s) / ED Diagnoses   Final diagnoses:  Muscle strain of right shoulder, initial encounter  Dry mouth, unspecified    New Prescriptions New Prescriptions   No medications on file     Sherwood Gambler, MD 11/24/16 1047

## 2016-11-24 NOTE — ED Triage Notes (Addendum)
Right arm pain times 4 days.  Denies any injury.  Pt also c/o dry mouth and feeling like her potassium is low.

## 2016-11-28 ENCOUNTER — Other Ambulatory Visit: Payer: Self-pay | Admitting: Family

## 2016-12-02 ENCOUNTER — Ambulatory Visit (INDEPENDENT_AMBULATORY_CARE_PROVIDER_SITE_OTHER): Payer: Medicare Other | Admitting: Family

## 2016-12-02 ENCOUNTER — Encounter: Payer: Self-pay | Admitting: Family

## 2016-12-02 VITALS — BP 150/90 | HR 113 | Temp 97.1°F | Ht 62.0 in | Wt 152.8 lb

## 2016-12-02 DIAGNOSIS — S46911A Strain of unspecified muscle, fascia and tendon at shoulder and upper arm level, right arm, initial encounter: Secondary | ICD-10-CM

## 2016-12-02 DIAGNOSIS — Z09 Encounter for follow-up examination after completed treatment for conditions other than malignant neoplasm: Secondary | ICD-10-CM

## 2016-12-02 DIAGNOSIS — F331 Major depressive disorder, recurrent, moderate: Secondary | ICD-10-CM | POA: Diagnosis not present

## 2016-12-02 DIAGNOSIS — Z23 Encounter for immunization: Secondary | ICD-10-CM

## 2016-12-02 DIAGNOSIS — F411 Generalized anxiety disorder: Secondary | ICD-10-CM

## 2016-12-02 DIAGNOSIS — F329 Major depressive disorder, single episode, unspecified: Secondary | ICD-10-CM | POA: Insufficient documentation

## 2016-12-02 MED ORDER — DULOXETINE HCL 30 MG PO CPEP: 30.0000 mg | ORAL_CAPSULE | Freq: Every day | ORAL | 1 refills | Status: DC

## 2016-12-02 NOTE — Progress Notes (Signed)
   Subjective:    Patient ID: Kaitlyn Serrano, female    DOB: 02/14/1941, 76 y.o.   MRN: [id number]  Pt presents to the office today for hospital follow up on 11/24/16 for right arm pain and was diagnosed with muscle strain. Pt had negative x-ray. Anxiety  Presents for follow-up visit. Symptoms include decreased concentration, depressed mood, excessive worry, irritability, nervous/anxious behavior and restlessness. Symptoms occur occasionally. The severity of symptoms is moderate.    Depression         This is a chronic problem.  The current episode started more than 1 year ago.   The onset quality is gradual.   The problem occurs intermittently.  The problem has been waxing and waning since onset.  Associated symptoms include decreased concentration, restlessness and sad.  Past medical history includes anxiety.     [facility] reviewed.   Review of Systems  Constitutional: Positive for irritability.  Psychiatric/Behavioral: Positive for agitation, decreased concentration and depression. The patient is nervous/anxious.   All other systems reviewed and are negative.      Objective:   Physical Exam  Constitutional: She is oriented to person, place, and time. She appears well-developed and well-nourished. No distress.  HENT:  Head: Normocephalic and atraumatic.  Right Ear: External ear normal.  Left Ear: External ear normal.  Nose: Nose normal.  Mouth/Throat: Oropharynx is clear and moist.  Eyes: Pupils are equal, round, and reactive to light.  Neck: Normal range of motion. Neck supple. No thyromegaly present.  Cardiovascular: Normal rate, regular rhythm, normal heart sounds and intact distal pulses.   No murmur heard. Pulmonary/Chest: Effort normal and breath sounds normal. No respiratory distress. She has no wheezes.  Abdominal: Soft. Bowel sounds are normal. She exhibits no distension. There is no tenderness.  Musculoskeletal: Normal range of motion. She exhibits no edema or  tenderness.  Neurological: She is alert and oriented to person, place, and time. She has normal reflexes. No cranial nerve deficit.  Skin: Skin is warm and dry.  Psychiatric: Her behavior is normal. Judgment and thought content normal. Her mood appears anxious.  Vitals reviewed.     BP (!) 150/90   Pulse (!) 113   Temp (!) 97.1 F (36.2 C) (Oral)   Ht _0  (1.575 m)   Wt 152 lb 12.8 oz (69.3 kg)   BMI 27.95 kg/m      Assessment & Plan:  1. GAD (generalized anxiety disorder) PT started on Cymbalta 30 mg Long discussion about not taking Norco and xanax together Stress management discussed - DULoxetine (CYMBALTA) 30 MG capsule; Take 1 capsule (30 mg total) by mouth daily.  Dispense: 90 capsule; Refill: 1 - CMP14+EGFR  2. Hospital discharge follow-up - CMP14+EGFR  3. Muscle strain of right upper arm, initial encounter Continue ROM exercises Rest Ice - CMP14+EGFR  4. Moderate episode of recurrent major depressive disorder ([place])    Evelina Dun, FNP

## 2016-12-02 NOTE — Addendum Note (Signed)
Addended by: Shelbie Ammons on: 12/02/2016 02:51 PM   Modules accepted: Orders

## 2016-12-02 NOTE — Patient Instructions (Signed)
Stress and Stress Management Stress is a normal reaction to life events. It is what you feel when life demands more than you are used to or more than you can handle. Some stress can be useful. For example, the stress reaction can help you catch the last bus of the day, study for a test, or meet a deadline at work. But stress that occurs too often or for too long can cause problems. It can affect your emotional health and interfere with relationships and normal daily activities. Too much stress can weaken your immune system and increase your risk for physical illness. If you already have a medical problem, stress can make it worse. What are the causes? All sorts of life events may cause stress. An event that causes stress for one person may not be stressful for another person. Major life events commonly cause stress. These may be positive or negative. Examples include losing your job, moving into a new home, getting married, having a baby, or losing a loved one. Less obvious life events may also cause stress, especially if they occur day after day or in combination. Examples include working long hours, driving in traffic, caring for children, being in debt, or being in a difficult relationship. What are the signs or symptoms? Stress may cause emotional symptoms including, the following:  Anxiety. This is feeling worried, afraid, on edge, overwhelmed, or out of control.  Anger. This is feeling irritated or impatient.  Depression. This is feeling sad, down, helpless, or guilty.  Difficulty focusing, remembering, or making decisions.  Stress may cause physical symptoms, including the following:  Aches and pains. These may affect your head, neck, back, stomach, or other areas of your body.  Tight muscles or clenched jaw.  Low energy or trouble sleeping.  Stress may cause unhealthy behaviors, including the following:  Eating to feel better (overeating) or skipping meals.  Sleeping too little,  too much, or both.  Working too much or putting off tasks (procrastination).  Smoking, drinking alcohol, or using drugs to feel better.  How is this diagnosed? Stress is diagnosed through an assessment by your health care provider. Your health care provider will ask questions about your symptoms and any stressful life events.Your health care provider will also ask about your medical history and may order blood tests or other tests. Certain medical conditions and medicine can cause physical symptoms similar to stress. Mental illness can cause emotional symptoms and unhealthy behaviors similar to stress. Your health care provider may refer you to a mental health professional for further evaluation. How is this treated? Stress management is the recommended treatment for stress.The goals of stress management are reducing stressful life events and coping with stress in healthy ways. Techniques for reducing stressful life events include the following:  Stress identification. Self-monitor for stress and identify what causes stress for you. These skills may help you to avoid some stressful events.  Time management. Set your priorities, keep a calendar of events, and learn to say "no." These tools can help you avoid making too many commitments.  Techniques for coping with stress include the following:  Rethinking the problem. Try to think realistically about stressful events rather than ignoring them or overreacting. Try to find the positives in a stressful situation rather than focusing on the negatives.  Exercise. Physical exercise can release both physical and emotional tension. The key is to find a form of exercise you enjoy and do it regularly.  Relaxation techniques. These relax the body and  mind. Examples include yoga, meditation, tai chi, biofeedback, deep breathing, progressive muscle relaxation, listening to music, being out in nature, journaling, and other hobbies. Again, the key is to find  one or more that you enjoy and can do regularly.  Healthy lifestyle. Eat a balanced diet, get plenty of sleep, and do not smoke. Avoid using alcohol or drugs to relax.  Strong support network. Spend time with family, friends, or other people you enjoy being around.Express your feelings and talk things over with someone you trust.  Counseling or talktherapy with a mental health professional may be helpful if you are having difficulty managing stress on your own. Medicine is typically not recommended for the treatment of stress.Talk to your health care provider if you think you need medicine for symptoms of stress. Follow these instructions at home:  Keep all follow-up visits as directed by your health care provider.  Take all medicines as directed by your health care provider. Contact a health care provider if:  Your symptoms get worse or you start having new symptoms.  You feel overwhelmed by your problems and can no longer manage them on your own. Get help right away if:  You feel like hurting yourself or someone else. This information is not intended to replace advice given to you by your health care provider. Make sure you discuss any questions you have with your health care provider. Document Released: 08/07/2000 Document Revised: 07/20/2015 Document Reviewed: 10/06/2012 [employer] Interactive Patient Education  2017 [employer]  

## 2017-01-07 ENCOUNTER — Ambulatory Visit (INDEPENDENT_AMBULATORY_CARE_PROVIDER_SITE_OTHER): Payer: Medicare Other | Admitting: Family Medicine

## 2017-01-07 ENCOUNTER — Encounter: Payer: Self-pay | Admitting: Family Medicine

## 2017-01-07 VITALS — BP 105/80 | HR 81 | Temp 97.6°F | Ht 62.0 in | Wt 153.0 lb

## 2017-01-07 DIAGNOSIS — J320 Chronic maxillary sinusitis: Secondary | ICD-10-CM

## 2017-01-07 MED ORDER — FLUTICASONE PROPIONATE 50 MCG/ACT NA SUSP
2.0000 | Freq: Every day | NASAL | 6 refills | Status: DC
Start: 2017-01-07 — End: 2018-07-10

## 2017-01-07 MED ORDER — LORATADINE 10 MG PO TABS: 10.0000 mg | ORAL_TABLET | Freq: Every day | ORAL | 11 refills | Status: DC

## 2017-01-07 NOTE — Patient Instructions (Signed)
It does not appear that you have a bacterial sinus infection on today's exam.  However I do think that you do have chronic inflammation of your sinuses.  For this reason, I have prescribed you a nasal spray to open up your sinuses.  I also have prescribed Claritin generic to take 1 time per day for allergies.  I do recommend that you consider doing the sinus rinses we discussed.  This is called a Nettie pot.  The most important thing with doing this is that you make sure to use distilled water each time.  Please schedule an appointment with [facility] for Friday for follow-up.  Sinus Rinse What is a sinus rinse? A sinus rinse is a simple home treatment that is used to rinse your sinuses with a sterile mixture of salt and water (saline solution). Sinuses are air-filled spaces in your skull behind the bones of your face and forehead that open into your nasal cavity. You will use the following:  Saline solution.  Neti pot or spray bottle. This releases the saline solution into your nose and through your sinuses. Neti pots and spray bottles can be purchased at [employer], a health food store, or online.  When would I do a sinus rinse? A sinus rinse can help to clear mucus, dirt, dust, or pollen from the nasal cavity. You may do a sinus rinse when you have a cold, a virus, nasal allergy symptoms, a sinus infection, or stuffiness in the nose or sinuses. If you are considering a sinus rinse:  Ask your child's health care provider before performing a sinus rinse on your child.  Do not do a sinus rinse if you have had ear or nasal surgery, ear infection, or blocked ears.  How do I do a sinus rinse?  Wash your hands.  Disinfect your device according to the directions provided and then dry it.  Use the solution that comes with your device or one that is sold separately in stores. Follow the mixing directions on the package.  Fill your device with the amount of saline solution as directed by the  device instructions.  Stand over a sink and tilt your head sideways over the sink.  Place the spout of the device in your upper nostril (the one closer to the ceiling).  Gently pour or squeeze the saline solution into the nasal cavity. The liquid should drain to the lower nostril if you are not overly congested.  Gently blow your nose. Blowing too hard may cause ear pain.  Repeat in the other nostril.  Clean and rinse your device with clean water and then air-dry it. Are there risks of a sinus rinse? Sinus rinse is generally very safe and effective. However, there are a few risks, which include:  A burning sensation in the sinuses. This may happen if you do not make the saline solution as directed. Make sure to follow all directions when making the saline solution.  Infection from contaminated water. This is rare, but possible.  Nasal irritation.  This information is not intended to replace advice given to you by your health care provider. Make sure you discuss any questions you have with your health care provider. Document Released: 09/08/2013 Document Revised: 01/09/2016 Document Reviewed: 06/29/2013 Elsevier Interactive Patient Education  2017 [employer] Inc.   Sinusitis, Adult Sinusitis is soreness and inflammation of your sinuses. Sinuses are hollow spaces in the bones around your face. Your sinuses are located:  Around your eyes.  In the middle  of your forehead.  Behind your nose.  In your cheekbones.  Your sinuses and nasal passages are lined with a stringy fluid (mucus). Mucus normally drains out of your sinuses. When your nasal tissues become inflamed or swollen, the mucus can become trapped or blocked so air cannot flow through your sinuses. This allows bacteria, viruses, and funguses to grow, which leads to infection. Sinusitis can develop quickly and last for 7?10 days (acute) or for more than 12 weeks (chronic). Sinusitis often develops after a cold. What are the  causes? This condition is caused by anything that creates swelling in the sinuses or stops mucus from draining, including:  Allergies.  Asthma.  Bacterial or viral infection.  Abnormally shaped bones between the nasal passages.  Nasal growths that contain mucus (nasal polyps).  Narrow sinus openings.  Pollutants, such as chemicals or irritants in the air.  A foreign object stuck in the nose.  A fungal infection. This is rare.  What increases the risk? The following factors may make you more likely to develop this condition:  Having allergies or asthma.  Having had a recent cold or respiratory tract infection.  Having structural deformities or blockages in your nose or sinuses.  Having a weak immune system.  Doing a lot of swimming or diving.  Overusing nasal sprays.  Smoking.  What are the signs or symptoms? The main symptoms of this condition are pain and a feeling of pressure around the affected sinuses. Other symptoms include:  Upper toothache.  Earache.  Headache.  Bad breath.  Decreased sense of smell and taste.  A cough that may get worse at night.  Fatigue.  Fever.  Thick drainage from your nose. The drainage is often green and it may contain pus (purulent).  Stuffy nose or congestion.  Postnasal drip. This is when extra mucus collects in the throat or back of the nose.  Swelling and warmth over the affected sinuses.  Sore throat.  Sensitivity to light.  How is this diagnosed? This condition is diagnosed based on symptoms, a medical history, and a physical exam. To find out if your condition is acute or chronic, your health care provider may:  Look in your nose for signs of nasal polyps.  Tap over the affected sinus to check for signs of infection.  View the inside of your sinuses using an imaging device that has a light attached (endoscope).  If your health care provider suspects that you have chronic sinusitis, you may also:  Be  tested for allergies.  Have a sample of mucus taken from your nose (nasal culture) and checked for bacteria.  Have a mucus sample examined to see if your sinusitis is related to an allergy.  If your sinusitis does not respond to treatment and it lasts longer than 8 weeks, you may have an MRI or CT scan to check your sinuses. These scans also help to determine how severe your infection is. In rare cases, a bone biopsy may be done to rule out more serious types of fungal sinus disease. How is this treated? Treatment for sinusitis depends on the cause and whether your condition is chronic or acute. If a virus is causing your sinusitis, your symptoms will go away on their own within 10 days. You may be given medicines to relieve your symptoms, including:  Topical nasal decongestants. They shrink swollen nasal passages and let mucus drain from your sinuses.  Antihistamines. These drugs block inflammation that is triggered by allergies. This can help  to ease swelling in your nose and sinuses.  Topical nasal corticosteroids. These are nasal sprays that ease inflammation and swelling in your nose and sinuses.  Nasal saline washes. These rinses can help to get rid of thick mucus in your nose.  If your condition is caused by bacteria, you will be given an antibiotic medicine. If your condition is caused by a fungus, you will be given an antifungal medicine. Surgery may be needed to correct underlying conditions, such as narrow nasal passages. Surgery may also be needed to remove polyps. Follow these instructions at home: Medicines  Take, use, or apply over-the-counter and prescription medicines only as told by your health care provider. These may include nasal sprays.  If you were prescribed an antibiotic medicine, take it as told by your health care provider. Do not stop taking the antibiotic even if you start to feel better. Hydrate and Humidify  Drink enough water to keep your urine clear or  pale yellow. Staying hydrated will help to thin your mucus.  Use a cool mist humidifier to keep the humidity level in your home above 50%.  Inhale steam for 10-15 minutes, 3-4 times a day or as told by your health care provider. You can do this in the bathroom while a hot shower is running.  Limit your exposure to cool or dry air. Rest  Rest as much as possible.  Sleep with your head raised (elevated).  Make sure to get enough sleep each night. General instructions  Apply a warm, moist washcloth to your face 3-4 times a day or as told by your health care provider. This will help with discomfort.  Wash your hands often with soap and water to reduce your exposure to viruses and other germs. If soap and water are not available, use hand sanitizer.  Do not smoke. Avoid being around people who are smoking (secondhand smoke).  Keep all follow-up visits as told by your health care provider. This is important. Contact a health care provider if:  You have a fever.  Your symptoms get worse.  Your symptoms do not improve within 10 days. Get help right away if:  You have a severe headache.  You have persistent vomiting.  You have pain or swelling around your face or eyes.  You have vision problems.  You develop confusion.  Your neck is stiff.  You have trouble breathing. This information is not intended to replace advice given to you by your health care provider. Make sure you discuss any questions you have with your health care provider. Document Released: 02/11/2005 Document Revised: 10/08/2015 Document Reviewed: 12/07/2014 Elsevier Interactive Patient Education  2017 [employer].

## 2017-01-07 NOTE — Progress Notes (Signed)
Subjective: CC: sinusitis PCP: Sharion Balloon, FNP IRS:WNIOEVO Kaitlyn Serrano is a 76 y.o. female presenting to clinic today for:  1. Sinus symptoms  Patient reports sinus pressure, nasal congestion, sinus headache and epistaxis following blowing her nose.  She notes sinus pressure.  She reports that mucus has some color to it.  She has been using Claritin with little improvement in symptoms.  She does report that the Norco she is provided for pain does help with the pain and pressure.  Symptoms have been going on for about 1.5 weeks.  No fevers, chills.  Significant history includes recent treatment for frontal sinusitis with both doxycycline and then 3 weeks later Levaquin.  She reports that she had no improvement in symptoms with these medications.  She does report a history of sinus surgery in the past.  Not currently using any sinus rinses, nasal sprays.   Allergies  Allergen Reactions  . Bee Venom Anaphylaxis  . Amlodipine     swelling  . Elavil [Amitriptyline Hcl] Other (See Comments)    confusion  . Statins Swelling and Other (See Comments)    Peeling of skin  . Penicillins Rash    To mouth   Past Medical History:  Diagnosis Date  . Asthma   . Blood transfusion   . Blurred vision   . CAD (coronary artery disease)   . Chest pain   . CHF (congestive heart failure) ([place])   . Chronic pain syndrome   . Cough   . Cyanosis   . Diabetes ([place])   . Diverticulosis   . Double vision   . 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 Dysphagia   . Dyspnea   . Exposure to TB   . Facial pain   . Generalized weakness   . GERD (gastroesophageal reflux disease)   . GERD (gastroesophageal reflux disease)   . Glaucoma   . Hearing loss   . Heart murmur   . Hemorrhoids   . Hemorrhoids   . High cholesterol   . Hoarseness   . Hx of cardiovascular stress test    negative 2012 and 2016  . Hypertension   . IBS (irritable bowel syndrome)   . Insomnia   . Intervertebral disc disorder with  myelopathy, lumbar region   . Irregular heart beat   . Leg swelling   . Lumbago   . Lumbar spine pain   . MI (myocardial infarction) ([place])   . Nasal congestion   . Night sweats   . Palpitations   . Rectal bleeding   . Renal insufficiency   . Ringing in ears   . Sinus infection   . Spondylosis with myelopathy, lumbar region   . Syncope   . Valvular heart disease   . Vertigo   . Vision loss   . Visual disorder   . Wheezing    Family History  Problem Relation Age of Onset  . Diabetes Mother   . Heart disease Mother   . Hypertension Mother   . Gallbladder disease Mother   . Heart disease Father   . Pneumonia Sister   . Hypertension Brother   . Hyperlipidemia Brother   . Gallbladder disease Unknown   . Hypertension Child     Current Outpatient Medications:  .  ALPRAZolam (XANAX) 0.5 MG tablet, TAKE 1/2 TABLET TWICE DAILY AS NEEDED FOR SLEEP OR ANXIETY, Disp: 60 tablet, Rfl: 3 .  atenolol (TENORMIN) 50 MG tablet, TAKE 1 TABLET DAILY, Disp: 90 tablet,  Rfl: 1 .  CARTIA XT 120 MG 24 hr capsule, TAKE ONE CAPSULE BY MOUTH ONCE DAILY, Disp: 90 capsule, Rfl: 1 .  cyclobenzaprine (FLEXERIL) 10 MG tablet, Take 1 tablet (10 mg total) by mouth 2 (two) times daily as needed for muscle spasms., Disp: 90 tablet, Rfl: 1 .  dicyclomine (BENTYL) 10 MG capsule, TAKE  (1)  CAPSULE  TWICE DAILY., Disp: 60 capsule, Rfl: 2 .  diltiazem (TIAZAC) 120 MG 24 hr capsule, TAKE (1) CAPSULE DAILY, Disp: 90 capsule, Rfl: 1 .  DULoxetine (CYMBALTA) 30 MG capsule, Take 1 capsule (30 mg total) by mouth daily., Disp: 90 capsule, Rfl: 1 .  EPINEPHrine 0.3 mg/0.3 mL IJ SOAJ injection, Inject 0.3 mLs (0.3 mg total) into the muscle once., Disp: 2 Device, Rfl: 1 .  FEMRING 0.05 MG/24HR RING, INSERT (1) INTO THE VAGINA AS DIR- ECTED. -FOR VAGINAL USE-, Disp: 1 each, Rfl: 4 .  furosemide (LASIX) 40 MG tablet, TAKE 1 TABLET DAILY, Disp: 90 tablet, Rfl: 1 .  HYDROcodone-acetaminophen (NORCO) 10-325 MG tablet, Take 1  tablet by mouth every 8 (eight) hours as needed for moderate pain or severe pain. , Disp: , Rfl:  .  lactulose (CHRONULAC) 10 GM/15ML solution, Take 15 mLs (10 g total) by mouth 2 (two) times daily., Disp: 240 mL, Rfl: 0 .  ondansetron (ZOFRAN) 4 MG tablet, Take 1 tablet (4 mg total) by mouth every 8 (eight) hours as needed for nausea or vomiting., Disp: 20 tablet, Rfl: 0 .  pantoprazole (PROTONIX) 40 MG tablet, TAKE ONE TABLET BY MOUTH TWICE DAILY, Disp: 180 tablet, Rfl: 1 .  polyethylene glycol powder (GLYCOLAX/MIRALAX) powder, Take 17 g by mouth 2 (two) times daily as needed., Disp: 3162 g, Rfl: 0 .  traZODone (DESYREL) 50 MG tablet, Take 1 tablet (50 mg total) by mouth at bedtime., Disp: 90 tablet, Rfl: 0 .  VOLTAREN 1 % GEL, Apply 4 g topically as needed. , Disp: , Rfl:   Social Hx: former smoker.  ROS: Per HPI  Objective: Office vital signs reviewed. BP 105/80   Pulse 81   Temp 97.6 F (36.4 C)   Ht 5\' 2"  (1.575 m)   Wt 153 lb (69.4 kg)   BMI 27.98 kg/m   Physical Examination:  General: Awake, alert, well nourished, nontoxic appearing, No acute distress HEENT: + frontal and maxillary sinus tenderness to percussion.    Neck: No masses palpated. No lymphadenopathy    Ears: Tympanic membranes intact, normal light reflex, no erythema, no bulging    Eyes: PERRLA, extraocular membranes intact, sclera white    Nose: nasal turbinates moist, no nasal discharge; deviated septum noted.  Mildly edematous nasal turbinates noted on the left.    Throat: moist mucus membranes, no erythema, no tonsillar exudate.  Airway is patent Cardio: regular rate and rhythm, S1S2 heard, no murmurs appreciated Pulm: clear to auscultation bilaterally, no wheezes, rhonchi or rales; normal work of breathing on room air   Assessment/ Plan: 76 y.o. female   1. Chronic maxillary sinusitis Her exam does not appear to be bacterial.  She is afebrile with normal vital signs.  She is status post treatment with  antibiotics at the beginning and at the end of September.  Physical exam remarkable for frontal and maxillary sinus tenderness.  No purulence noted on exam.  Will treat for sinusitis with Flonase nasal spray, Claritin and sinus rinses.  Instructions for use were reviewed with the patient and her husband who voiced good understanding.  Patient will follow-up as scheduled with PCP on Monday for recheck.  Referral to ENT has been placed for further evaluation. - Ambulatory referral to ENT   Orders Placed This Encounter  Procedures  . Ambulatory referral to ENT    Referral Priority:   Routine    Referral Type:   Consultation    Referral Reason:   Specialty Services Required    Requested Specialty:   Otolaryngology    Number of Visits Requested:   1   Meds ordered this encounter  Medications  . fluticasone (FLONASE) 50 MCG/ACT nasal spray    Sig: Place 2 sprays daily into both nostrils.    Dispense:  16 g    Refill:  6  . loratadine (CLARITIN) 10 MG tablet    Sig: Take 1 tablet (10 mg total) daily by mouth.    Dispense:  30 tablet    Refill:  [place], [place] [phone]

## 2017-01-13 ENCOUNTER — Ambulatory Visit: Payer: Medicare Other | Admitting: Family

## 2017-01-14 ENCOUNTER — Ambulatory Visit (INDEPENDENT_AMBULATORY_CARE_PROVIDER_SITE_OTHER): Payer: Medicare Other | Admitting: Family

## 2017-01-14 ENCOUNTER — Encounter: Payer: Self-pay | Admitting: Family

## 2017-01-14 VITALS — BP 190/86 | HR 62 | Temp 97.2°F | Ht 62.0 in | Wt 155.4 lb

## 2017-01-14 DIAGNOSIS — I10 Essential (primary) hypertension: Secondary | ICD-10-CM

## 2017-01-14 DIAGNOSIS — F331 Major depressive disorder, recurrent, moderate: Secondary | ICD-10-CM | POA: Diagnosis not present

## 2017-01-14 DIAGNOSIS — F411 Generalized anxiety disorder: Secondary | ICD-10-CM | POA: Diagnosis not present

## 2017-01-14 LAB — CMP14+EGFR
ALK PHOS: 90 IU/L (ref 39–117)
ALT: 15 IU/L (ref 0–32)
AST: 18 IU/L (ref 0–40)
Albumin/Globulin Ratio: 1.6 (ref 1.2–2.2)
Albumin: 3.6 g/dL (ref 3.5–4.8)
BUN/Creatinine Ratio: 12 (ref 12–28)
BUN: 7 mg/dL — ABNORMAL LOW (ref 8–27)
Bilirubin Total: 0.6 mg/dL (ref 0.0–1.2)
CO2: 28 mmol/L (ref 20–29)
CREATININE: 0.57 mg/dL (ref 0.57–1.00)
Calcium: 9 mg/dL (ref 8.7–10.3)
Chloride: 103 mmol/L (ref 96–106)
GFR calc Af Amer: 104 mL/min/{1.73_m2} (ref >59)
GFR calc non Af Amer: 90 mL/min/{1.73_m2} (ref >59)
GLOBULIN, TOTAL: 2.3 g/dL (ref 1.5–4.5)
Glucose: 87 mg/dL (ref 65–99)
POTASSIUM: 4.3 mmol/L (ref 3.5–5.2)
SODIUM: 140 mmol/L (ref 134–144)
Total Protein: 5.9 g/dL — ABNORMAL LOW (ref 6.0–8.5)

## 2017-01-14 NOTE — Patient Instructions (Signed)

## 2017-01-14 NOTE — Progress Notes (Signed)
   Subjective:    Patient ID: Kaitlyn Serrano, female    DOB: 1941/01/03, 76 y.o.   MRN: [id number]  Pt presents to the office today to recheck GAD. Pt was started Cymbalta 30 mg daily. Pt states she has not worried about anything.  Anxiety  Presents for follow-up visit. Symptoms include depressed mood, irritability and nervous/anxious behavior. Patient reports no excessive worry, panic, restlessness or shortness of breath. Symptoms occur occasionally. The severity of symptoms is moderate.    Depression         This is a chronic problem.  The current episode started more than 1 year ago.   The onset quality is gradual.   The problem occurs intermittently.  Associated symptoms include irritable, decreased interest and sad.  Associated symptoms include no helplessness, no hopelessness and no restlessness.  Compliance with treatment is good.  Past medical history includes anxiety.   Hypertension  This is a chronic problem. The current episode started more than 1 year ago. The problem is unchanged. The problem is uncontrolled. Associated symptoms include anxiety and malaise/fatigue. Pertinent negatives include no peripheral edema or shortness of breath. There is no history of CAD/MI, CVA or heart failure.      Review of Systems  Constitutional: Positive for irritability and malaise/fatigue.  Respiratory: Negative for shortness of breath.   Psychiatric/Behavioral: Positive for depression. The patient is nervous/anxious.   All other systems reviewed and are negative.      Objective:   Physical Exam  Constitutional: She is oriented to person, place, and time. She appears well-developed and well-nourished. She is irritable. No distress.  HENT:  Head: Normocephalic and atraumatic.  Right Ear: External ear normal.  Left Ear: External ear normal.  Nose: Nose normal.  Mouth/Throat: Oropharynx is clear and moist.  Eyes: Pupils are equal, round, and reactive to light.  Neck: Normal range of  motion. Neck supple. No thyromegaly present.  Cardiovascular: Normal rate, regular rhythm, normal heart sounds and intact distal pulses.  No murmur heard. Pulmonary/Chest: Effort normal and breath sounds normal. No respiratory distress. She has no wheezes.  Abdominal: Soft. Bowel sounds are normal. She exhibits no distension. There is no tenderness.  Musculoskeletal: Normal range of motion. She exhibits no edema or tenderness.  Neurological: She is alert and oriented to person, place, and time.  Skin: Skin is warm and dry.  Psychiatric: She has a normal mood and affect. Her behavior is normal. Judgment and thought content normal.  Vitals reviewed.    BP (!) 190/86   Pulse 62   Temp (!) 97.2 F (36.2 C) (Oral)   Ht _0  (1.575 m)   Wt 155 lb 6.4 oz (70.5 kg)   BMI 28.42 kg/m      Assessment & Plan:  1. Essential hypertension Pt has not taken any of her medications today Pt states she will go home and take her medications right now and will come by tomorrow morning have her BP rechecked - CMP14+EGFR  2. Moderate episode of recurrent major depressive disorder (HCC) - CMP14+EGFR  3. GAD (generalized anxiety disorder) - CMP14+EGFR  Continue Cymbalta  Stress management discussed RTO prn and keep chronic follow up  Evelina Dun, FNP

## 2017-01-24 ENCOUNTER — Other Ambulatory Visit: Payer: Self-pay | Admitting: Family

## 2017-01-24 DIAGNOSIS — K5732 Diverticulitis of large intestine without perforation or abscess without bleeding: Secondary | ICD-10-CM

## 2017-01-27 ENCOUNTER — Other Ambulatory Visit: Payer: Self-pay | Admitting: Family

## 2017-01-27 DIAGNOSIS — Z1231 Encounter for screening mammogram for malignant neoplasm of breast: Secondary | ICD-10-CM

## 2017-02-03 ENCOUNTER — Ambulatory Visit (HOSPITAL_COMMUNITY): Payer: Medicare Other

## 2017-02-12 ENCOUNTER — Encounter: Payer: Self-pay | Admitting: Family

## 2017-02-12 ENCOUNTER — Ambulatory Visit (INDEPENDENT_AMBULATORY_CARE_PROVIDER_SITE_OTHER): Payer: Medicare Other | Admitting: Family

## 2017-02-12 VITALS — BP 173/87 | HR 62 | Temp 97.6°F | Ht 62.0 in | Wt 153.8 lb

## 2017-02-12 DIAGNOSIS — E782 Mixed hyperlipidemia: Secondary | ICD-10-CM | POA: Diagnosis not present

## 2017-02-12 DIAGNOSIS — K219 Gastro-esophageal reflux disease without esophagitis: Secondary | ICD-10-CM | POA: Diagnosis not present

## 2017-02-12 DIAGNOSIS — E1159 Type 2 diabetes mellitus with other circulatory complications: Secondary | ICD-10-CM | POA: Diagnosis not present

## 2017-02-12 DIAGNOSIS — B002 Herpesviral gingivostomatitis and pharyngotonsillitis: Secondary | ICD-10-CM

## 2017-02-12 DIAGNOSIS — F331 Major depressive disorder, recurrent, moderate: Secondary | ICD-10-CM

## 2017-02-12 DIAGNOSIS — R7303 Prediabetes: Secondary | ICD-10-CM

## 2017-02-12 DIAGNOSIS — I10 Essential (primary) hypertension: Secondary | ICD-10-CM

## 2017-02-12 DIAGNOSIS — F411 Generalized anxiety disorder: Secondary | ICD-10-CM

## 2017-02-12 LAB — BAYER DCA HB A1C WAIVED: HB A1C: 5.6 % (ref <7.0)

## 2017-02-12 MED ORDER — VALACYCLOVIR HCL 1 G PO TABS: 2000.0000 mg | ORAL_TABLET | Freq: Two times a day (BID) | ORAL | 2 refills | Status: DC

## 2017-02-12 MED ORDER — FLUCONAZOLE 150 MG PO TABS: 150.0000 mg | ORAL_TABLET | ORAL | 0 refills | Status: DC | PRN

## 2017-02-12 NOTE — Progress Notes (Signed)
Subjective:    Patient ID: Kaitlyn Serrano, female    DOB: Jan 29, 1941, 76 y.o.   MRN: [id number]  Pt presents to the office today for chronic follow up.  Diabetes  She presents for her follow-up diabetic visit. She has type 2 diabetes mellitus. Her disease course has been stable. There are no hypoglycemic associated symptoms. Pertinent negatives for hypoglycemia include no headaches. There are no diabetic associated symptoms. Pertinent negatives for diabetes include no blurred vision, no foot paresthesias and no visual change. There are no hypoglycemic complications. Symptoms are stable. Risk factors for coronary artery disease include diabetes mellitus, dyslipidemia, obesity, hypertension and sedentary lifestyle. Her breakfast blood glucose range is generally 180-200 mg/dl.  Mouth Lesions   The current episode started more than 2 weeks ago. The onset was sudden. The problem has been rapidly worsening. The problem is moderate. Associated symptoms include diarrhea and mouth sores. Pertinent negatives include no headaches.  Diarrhea   This is a chronic problem. The current episode started more than 1 year ago. Pertinent negatives include no headaches.  Hypertension  This is a chronic problem. The current episode started more than 1 year ago. The problem has been waxing and waning since onset. The problem is uncontrolled. Pertinent negatives include no blurred vision, headaches, peripheral edema or shortness of breath. The current treatment provides moderate improvement. There is no history of kidney disease or heart failure.  Depression         This is a chronic problem.  The current episode started more than 1 year ago.   The onset quality is gradual.   The problem occurs intermittently.  The problem has been waxing and waning since onset.  Associated symptoms include no helplessness, no hopelessness and no headaches.  Past treatments include SNRIs - Serotonin and norepinephrine reuptake inhibitors.   Compliance with treatment is good.     Review of Systems  HENT: Positive for mouth sores.   Eyes: Negative for blurred vision.  Respiratory: Negative for shortness of breath.   Gastrointestinal: Positive for diarrhea.  Neurological: Negative for headaches.  Psychiatric/Behavioral: Positive for depression.  All other systems reviewed and are negative.      Objective:   Physical Exam  Constitutional: She is oriented to person, place, and time. She appears well-developed and well-nourished. No distress.  HENT:  Head: Normocephalic and atraumatic.  Right Ear: External ear normal.  Left Ear: External ear normal.  Nose: Nose normal.  Mouth/Throat: Oropharynx is clear and moist. Oral lesions present.    Eyes: Pupils are equal, round, and reactive to light.  Neck: Normal range of motion. Neck supple. No thyromegaly present.  Cardiovascular: Normal rate, regular rhythm, normal heart sounds and intact distal pulses.  No murmur heard. Pulmonary/Chest: Effort normal and breath sounds normal. No respiratory distress. She has no wheezes.  Abdominal: Soft. Bowel sounds are normal. She exhibits no distension. There is no tenderness.  Musculoskeletal: Normal range of motion. She exhibits no edema or tenderness.  Neurological: She is alert and oriented to person, place, and time.  Skin: Skin is warm and dry.  Psychiatric: She has a normal mood and affect. Her behavior is normal. Judgment and thought content normal.  Vitals reviewed.    BP (!) 160/81   Pulse 62   Temp 97.6 F (36.4 C) (Oral)   Ht '5\' 2"'  (1.575 m)   Wt 153 lb 12.8 oz (69.8 kg)   BMI 28.13 kg/m      Assessment & Plan:  1. Hypertension associated with diabetes ([place]) - CMP14+EGFR  2. Mixed hyperlipidemia - CMP14+EGFR - Lipid panel  3. Moderate episode of recurrent major depressive disorder (HCC) - CMP14+EGFR  4. Gastroesophageal reflux disease without esophagitis - CMP14+EGFR  5. GAD (generalized anxiety  disorder) - CMP14+EGFR  6. Recurrent oral herpes simplex Will start valtrex  - CMP14+EGFR - valACYclovir (VALTREX) 1000 MG tablet; Take 2 tablets (2,000 mg total) by mouth 2 (two) times daily.  Dispense: 4 tablet; Refill: 2  7. Prediabetes - Bayer DCA Hb A1c Waived - CMP14+EGFR   Continue all meds Labs pending Health Maintenance reviewed Diet and exercise encouraged RTO 4 months   Evelina Dun, FNP

## 2017-02-12 NOTE — Patient Instructions (Signed)
Cold Sore A cold sore, also called a fever blister, is a skin infection that causes small, fluid-filled sores to form inside of the mouth or on the lips, gums, nose, chin, or cheeks. Cold sores can spread to other parts of the body, such as the eyes or fingers. In some people with other medical conditions, cold sores can spread to multiple other body sites, including the genitals. Cold sores can be spread or passed from person to person (contagious) until the sores crust over completely. What are the causes? Cold sores are caused by the herpes simplex virus (HSV-1). HSV-1 is closely related to the virus that causes genital herpes (HSV-2), but these viruses are not the same. Once a person is infected with HSV-1, the virus remains permanently in the body. HSV-1 is spread from person to person through close contact, such as through kissing, touching the affected area, or sharing personal items such as lip balm, razors, or eating utensils. What increases the risk? A cold sore outbreak is more likely to develop in people who:  Are tired, stressed, or sick.  Are menstruating.  Are pregnant.  Take certain medicines.  Are exposed to cold weather or too much sun.  What are the signs or symptoms? Symptoms of a cold sore outbreak often go through different stages. Here is how a cold sore develops:  Tingling, itching, or burning is felt 1-2 days before the outbreak.  Fluid-filled blisters appear on the lips, inside the mouth, on the nose, or on the cheeks.  The blisters start to ooze clear fluid.  The blisters dry up and a yellow crust appears in its place.  The crust falls off.  Other symptoms include:  Fever.  Sore throat.  Headache.  Muscle aches.  Swollen neck glands.  You also may not have any symptoms. How is this diagnosed? This condition is often diagnosed based on your medical history and a physical exam. Your health care provider may swab your sore and then examine it in  the lab. Rarely, blood tests may be done to check for HSV-1. How is this treated? There is no cure for cold sores or HSV-1. There also is no vaccine for HSV-1. Most cold sores go away on their own without treatment within two weeks. Medicines cannot make the infection go away, but medicines can:  Help relieve some of the pain associated with the sores.  Work to stop the virus from multiplying.  Shorten healing time.  Medicines may be in the form of creams, gels, pills, or a shot. Follow these instructions at home: Medicines  Take or apply over-the-counter and prescription medicines only as told by your health care provider.  Use a cotton-tip swab to apply creams or gels to your sores. Sore Care  Do not touch the sores or pick the scabs.  Wash your hands often. Do not touch your eyes without washing your hands first.  Keep the sores clean and dry.  If directed, apply ice to the sores: ? Put ice in a plastic bag. ? Place a towel between your skin and the bag. ? Leave the ice on for 20 minutes, 2-3 times per day. Lifestyle  Do not kiss, have oral sex, or share personal items until your sores heal.  Eat a soft, bland diet. Avoid eating hot, cold, or salty foods. These can hurt your mouth.  Use a straw if it hurts to drink out of a glass.  Avoid the sun and limit your stress if these things   trigger outbreaks. If sun causes cold sores, apply sunscreen on your lips before being out in the sun. Contact a health care provider if:  You have symptoms for more than two weeks.  You have pus coming from the sores.  You have redness that is spreading.  You have pain or irritation in your eye.  You get sores on your genitals.  Your sores do not heal within two weeks.  You have frequent cold sore outbreaks. Get help right away if:  You have a fever and your symptoms suddenly get worse.  You have a headache and confusion. This information is not intended to replace advice  given to you by your health care provider. Make sure you discuss any questions you have with your health care provider. Document Released: 02/09/2000 Document Revised: 10/06/2015 Document Reviewed: 12/02/2014 Elsevier Interactive Patient Education  2018 Elsevier Inc.  

## 2017-02-12 NOTE — Addendum Note (Signed)
Addended by: Evelina Dun A on: 02/12/2017 09:44 AM   Modules accepted: Orders

## 2017-02-13 LAB — CMP14+EGFR
A/G RATIO: 1.4 (ref 1.2–2.2)
ALBUMIN: 3.7 g/dL (ref 3.5–4.8)
ALK PHOS: 91 IU/L (ref 39–117)
ALT: 17 IU/L (ref 0–32)
AST: 20 IU/L (ref 0–40)
BILIRUBIN TOTAL: 0.8 mg/dL (ref 0.0–1.2)
BUN / CREAT RATIO: 14 (ref 12–28)
BUN: 10 mg/dL (ref 8–27)
CHLORIDE: 104 mmol/L (ref 96–106)
CO2: 25 mmol/L (ref 20–29)
Calcium: 9.2 mg/dL (ref 8.7–10.3)
Creatinine, Ser: 0.71 mg/dL (ref 0.57–1.00)
GFR calc Af Amer: 96 mL/min/{1.73_m2} (ref >59)
GFR calc non Af Amer: 83 mL/min/{1.73_m2} (ref >59)
GLUCOSE: 91 mg/dL (ref 65–99)
Globulin, Total: 2.6 g/dL (ref 1.5–4.5)
POTASSIUM: 4.3 mmol/L (ref 3.5–5.2)
Sodium: 144 mmol/L (ref 134–144)
Total Protein: 6.3 g/dL (ref 6.0–8.5)

## 2017-02-13 LAB — LIPID PANEL
Chol/HDL Ratio: 3.2 ratio (ref 0.0–4.4)
Cholesterol, Total: 202 mg/dL — ABNORMAL HIGH (ref 100–199)
HDL: 64 mg/dL (ref >39)
LDL Calculated: 117 mg/dL — ABNORMAL HIGH (ref 0–99)
Triglycerides: 107 mg/dL (ref 0–149)
VLDL CHOLESTEROL CAL: 21 mg/dL (ref 5–40)

## 2017-02-27 ENCOUNTER — Ambulatory Visit (INDEPENDENT_AMBULATORY_CARE_PROVIDER_SITE_OTHER): Payer: Medicare Other | Admitting: Otolaryngology

## 2017-02-27 ENCOUNTER — Other Ambulatory Visit: Payer: Self-pay | Admitting: Family

## 2017-03-26 ENCOUNTER — Other Ambulatory Visit: Payer: Self-pay | Admitting: Family

## 2017-03-28 ENCOUNTER — Other Ambulatory Visit: Payer: Self-pay | Admitting: Orthopaedic Surgery

## 2017-03-28 DIAGNOSIS — M1611 Unilateral primary osteoarthritis, right hip: Secondary | ICD-10-CM

## 2017-04-05 ENCOUNTER — Ambulatory Visit
Admission: RE | Admit: 2017-04-05 | Discharge: 2017-04-05 | Disposition: A | Discharge status: Home / Self Care | Payer: Medicare Other | Source: Ambulatory Visit | Attending: Orthopaedic Surgery | Admitting: Orthopaedic Surgery

## 2017-04-05 DIAGNOSIS — M1611 Unilateral primary osteoarthritis, right hip: Secondary | ICD-10-CM

## 2017-04-15 ENCOUNTER — Other Ambulatory Visit: Payer: Self-pay | Admitting: Family

## 2017-04-17 ENCOUNTER — Encounter (HOSPITAL_COMMUNITY): Payer: Self-pay | Admitting: Emergency Medicine

## 2017-04-17 ENCOUNTER — Emergency Department (HOSPITAL_COMMUNITY)
Admission: EM | Admit: 2017-04-17 | Discharge: 2017-04-18 | Disposition: A | Discharge status: Home / Self Care | Payer: Medicare Other | Attending: Emergency Medicine | Admitting: Emergency Medicine

## 2017-04-17 ENCOUNTER — Other Ambulatory Visit: Payer: Self-pay

## 2017-04-17 DIAGNOSIS — Y92481 Parking lot as the place of occurrence of the external cause: Secondary | ICD-10-CM | POA: Insufficient documentation

## 2017-04-17 DIAGNOSIS — Y999 Unspecified external cause status: Secondary | ICD-10-CM | POA: Insufficient documentation

## 2017-04-17 DIAGNOSIS — W010XXA Fall on same level from slipping, tripping and stumbling without subsequent striking against object, initial encounter: Secondary | ICD-10-CM | POA: Insufficient documentation

## 2017-04-17 DIAGNOSIS — Z87891 Personal history of nicotine dependence: Secondary | ICD-10-CM | POA: Insufficient documentation

## 2017-04-17 DIAGNOSIS — Z79899 Other long term (current) drug therapy: Secondary | ICD-10-CM | POA: Diagnosis not present

## 2017-04-17 DIAGNOSIS — I119 Hypertensive heart disease without heart failure: Secondary | ICD-10-CM | POA: Insufficient documentation

## 2017-04-17 DIAGNOSIS — S7001XA Contusion of right hip, initial encounter: Secondary | ICD-10-CM | POA: Diagnosis not present

## 2017-04-17 DIAGNOSIS — S0003XA Contusion of scalp, initial encounter: Secondary | ICD-10-CM | POA: Diagnosis not present

## 2017-04-17 DIAGNOSIS — I2510 Atherosclerotic heart disease of native coronary artery without angina pectoris: Secondary | ICD-10-CM | POA: Insufficient documentation

## 2017-04-17 DIAGNOSIS — W19XXXA Unspecified fall, initial encounter: Secondary | ICD-10-CM

## 2017-04-17 DIAGNOSIS — Y939 Activity, unspecified: Secondary | ICD-10-CM | POA: Diagnosis not present

## 2017-04-17 DIAGNOSIS — S0990XA Unspecified injury of head, initial encounter: Secondary | ICD-10-CM | POA: Diagnosis present

## 2017-04-17 NOTE — ED Triage Notes (Signed)
Pt c/o slipping in parking lot of [employer] striking the back of her head. Pt c/o back, head and right hip pain. Pt states she thinks she passed out after fall.

## 2017-04-18 ENCOUNTER — Emergency Department (HOSPITAL_COMMUNITY): Payer: Medicare Other

## 2017-04-18 DIAGNOSIS — S0003XA Contusion of scalp, initial encounter: Secondary | ICD-10-CM | POA: Diagnosis not present

## 2017-04-18 MED ORDER — ACETAMINOPHEN 325 MG PO TABS
650.0000 mg | ORAL_TABLET | Freq: Once | ORAL | Status: AC
  Administered 2017-04-18: 650 mg via ORAL
  Filled 2017-04-18: qty 2

## 2017-04-18 NOTE — Discharge Instructions (Signed)
Use ice over the painful areas for comfort. You already have pain medication you can take at home. Recheck for any problems listed on the head injury sheet.

## 2017-04-18 NOTE — ED Provider Notes (Signed)
[facility] EMERGENCY DEPARTMENT Provider Note   CSN: [id number] Arrival date & time: 04/17/17  2252  Time seen 01:38 AM    History   Chief Complaint Chief Complaint  Patient presents with  . Fall   Patient states her new married last name is Kaitlyn Serrano  HPI Kaitlyn Serrano is a 77 y.o. female.  HPI patient is in the emergency with her husband and her daughter.  She and her husband were at [facility].  Husband states she went out to the car about a minute before him.  She states that she was getting in the car she slipped because it was raining and fell backwards.  She states she hit the back of her head.  She is unsure of loss of consciousness.  Husband states when he got there she was fully alert and oriented.  He states he was only a minute behind her.  She complains of pain in the back of her head, her neck, and her right hip area.  She had nausea once in the ED without vomiting.  She complains of new double vision however that is listed as 1 of her medical problems and her daughter states that is chronic.  Please note during the interview the patient keeps looking at her daughter and asking her the answers.  Daughter does indicate to me that the patient has memory problems.  She states she has pain on her right thigh when asked about numbness or tingling she states she has numbness in her thigh.  She does not have numbness in her hands or feet.  They report she ambulated from the car into the house but she had difficulty ambulating back out when they came to the ED.  PCP Sharion Balloon, FNP   Past Medical History:  Diagnosis Date  . Asthma   . Blood transfusion   . Blurred vision   . CAD (coronary artery disease)   . Chest pain   . CHF (congestive heart failure) ([place])   . Chronic pain syndrome   . Cough   . Cyanosis   . Diabetes ([place])   . Diverticulosis   . Double vision   . 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 Dysphagia   . Dyspnea   . Exposure to TB   . Facial pain   .  Generalized weakness   . GERD (gastroesophageal reflux disease)   . GERD (gastroesophageal reflux disease)   . Glaucoma   . Hearing loss   . Heart murmur   . Hemorrhoids   . Hemorrhoids   . High cholesterol   . Hoarseness   . Hx of cardiovascular stress test    negative 2012 and 2016  . Hypertension   . IBS (irritable bowel syndrome)   . Insomnia   . Intervertebral disc disorder with myelopathy, lumbar region   . Irregular heart beat   . Leg swelling   . Lumbago   . Lumbar spine pain   . MI (myocardial infarction) ([place])   . Nasal congestion   . Night sweats   . Palpitations   . Rectal bleeding   . Renal insufficiency   . Ringing in ears   . Sinus infection   . Spondylosis with myelopathy, lumbar region   . Syncope   . Valvular heart disease   . Vertigo   . Vision loss   . Visual disorder   . Wheezing     Patient Active Problem List   Diagnosis Date Noted  .  Prediabetes 02/12/2017  . 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 Constipation 01/16/2015  . Insomnia 01/16/2015  . GAD (generalized anxiety disorder) 10/08/2013  . Vitamin D deficiency 10/08/2013  . GERD (gastroesophageal reflux disease) 10/08/2013  . Hypertension associated with diabetes ([place]) 08/13/2012  . Hyperlipemia 08/13/2012  . Seasonal allergic rhinitis 06/30/2012  . Hemorrhoids 06/25/2011    Past Surgical History:  Procedure Laterality Date  . ABDOMINAL HYSTERECTOMY    . BACK SURGERY    . BONE MARROW ASPIRATION    . CERVICAL SPINE SURGERY    . FIXATION KYPHOPLASTY    . LAMINOTOMY    . SPINAL FUSION      OB History    Gravida Para Term Preterm AB Living   4 3 3  0 1     SAB TAB Ectopic Multiple Live Births   0 0 1           Home Medications    Prior to Admission medications   Medication Sig Start Date End Date Taking? Authorizing Provider  ALPRAZolam Duanne Moron) 0.5 MG tablet TAKE ONE-HALF TABLET BY MOUTH TWICE DAILY AS NEEDED FOR ANXIETY /SLEEP 03/27/17   Evelina Dun A, FNP  atenolol  (TENORMIN) 50 MG tablet TAKE 1 TABLET DAILY 11/29/16   Hawks, Christy A, FNP  CARTIA XT 120 MG 24 hr capsule TAKE ONE CAPSULE BY MOUTH ONCE DAILY 05/02/16   Evelina Dun A, FNP  cyclobenzaprine (FLEXERIL) 10 MG tablet Take 1 tablet (10 mg total) by mouth 2 (two) times daily as needed for muscle spasms. 04/27/15   Evelina Dun A, FNP  dicyclomine (BENTYL) 10 MG capsule TAKE  (1)  CAPSULE  TWICE DAILY. 09/30/16   Ladene Artist, MD  diltiazem [facility]) 120 MG 24 hr capsule TAKE (1) CAPSULE DAILY 11/29/16   Evelina Dun A, FNP  DULoxetine (CYMBALTA) 30 MG capsule Take 1 capsule (30 mg total) by mouth daily. 12/02/16   Evelina Dun A, FNP  EPINEPHrine 0.3 mg/0.3 mL IJ SOAJ injection Inject 0.3 mLs (0.3 mg total) into the muscle once. 10/06/13   Lysbeth Penner, FNP  FEMRING 0.05 MG/24HR RING INSERT (1) INTO THE VAGINA AS DIR- ECTED. -FOR VAGINAL USE- 07/02/16   Hassell Done, Mary-Margaret, FNP  fluconazole (DIFLUCAN) 150 MG tablet Take 1 tablet (150 mg total) by mouth every three (3) days as needed. 02/12/17   Sharion Balloon, FNP  fluticasone (FLONASE) 50 MCG/ACT nasal spray Place 2 sprays daily into both nostrils. 01/07/17   Janora Norlander, DO  furosemide (LASIX) 40 MG tablet TAKE 1 TABLET DAILY 11/29/16   Evelina Dun A, FNP  HYDROcodone-acetaminophen (NORCO) 10-325 MG tablet Take 1 tablet by mouth every 8 (eight) hours as needed for moderate pain or severe pain.     [provider]  lactulose (CHRONULAC) 10 GM/15ML solution Take 15 mLs (10 g total) by mouth 2 (two) times daily. 04/27/15   Sharion Balloon, FNP  loratadine (CLARITIN) 10 MG tablet Take 1 tablet (10 mg total) daily by mouth. 01/07/17   Gottschalk, Ashly M, DO  ondansetron (ZOFRAN) 4 MG tablet Take 1 tablet (4 mg total) by mouth every 8 (eight) hours as needed for nausea or vomiting. 04/15/17   Evelina Dun A, FNP  pantoprazole (PROTONIX) 40 MG tablet TAKE ONE TABLET BY MOUTH TWICE DAILY 04/08/16   Evelina Dun A, FNP    polyethylene glycol powder (GLYCOLAX/MIRALAX) powder Take 17 g by mouth 2 (two) times daily as needed. 06/28/16   Sharion Balloon, FNP  traZODone (DESYREL) 50 MG tablet TAKE ONE TABLET AT BEDTIME 02/28/17   Hawks, [name] A, FNP  valACYclovir (VALTREX) 1000 MG tablet Take 2 tablets (2,000 mg total) by mouth 2 (two) times daily. 02/12/17   Evelina Dun A, FNP  VOLTAREN 1 % GEL Apply 4 g topically as needed.  01/22/16   [provider]    Family History Family History  Problem Relation Age of Onset  . Diabetes Mother   . Heart disease Mother   . Hypertension Mother   . Gallbladder disease Mother   . Heart disease Father   . Pneumonia Sister   . Hypertension Brother   . Hyperlipidemia Brother   . Gallbladder disease Unknown   . Hypertension Child     Social History Social History   Tobacco Use  . Smoking status: Former Smoker    Packs/day: 4.00    Years: 3.00    Pack years: 12.00    Types: Cigarettes    Start date: 06/10/1960    Last attempt to quit: 02/20/1964    Years since quitting: 53.1  . Smokeless tobacco: Never Used  Substance Use Topics  . Alcohol use: No    Alcohol/week: 0.0 oz  . Drug use: No  lives with husband   Allergies   Bee venom; Amlodipine; Elavil [amitriptyline hcl]; Statins; and Penicillins   Review of Systems Review of Systems  All other systems reviewed and are negative.    Physical Exam Updated Vital Signs BP (!) 160/78   Pulse 72   Temp 98.4 F (36.9 C)   Resp 16   Ht 5\' 2"  (1.575 m)   Wt 67.6 kg (149 lb)   SpO2 92%   BMI 27.25 kg/m   Vital signs normal except for hypertension   Physical Exam  Constitutional: She is oriented to person, place, and time. She appears well-developed and well-nourished.  Non-toxic appearance. She does not appear ill. No distress.  HENT:  Head: Normocephalic.  Right Ear: External ear normal.  Left Ear: External ear normal.  Nose: Nose normal. No mucosal edema or rhinorrhea.   Mouth/Throat: Oropharynx is clear and moist and mucous membranes are normal. No dental abscesses or uvula swelling.  Has a faint abrasion on the back of her scalp.  It is not actively bleeding  Eyes: Conjunctivae and EOM are normal. Pupils are equal, round, and reactive to light.  Neck: Normal range of motion and full passive range of motion without pain. Neck supple.  Daughter keeps saying the patient neck hurts however she cannot localize any pain.  Cardiovascular: Normal rate, regular rhythm and normal heart sounds. Exam reveals no gallop and no friction rub.  No murmur heard. Pulmonary/Chest: Effort normal and breath sounds normal. No respiratory distress. She has no wheezes. She has no rhonchi. She has no rales. She exhibits no tenderness and no crepitus.  Abdominal: Soft. Normal appearance and bowel sounds are normal. She exhibits no distension. There is no tenderness. There is no rebound and no guarding.  Musculoskeletal: Normal range of motion. She exhibits no edema or tenderness.  Moves all extremities well.  Patient is tender over the greater trochanter of her right hip, there is no abrasions, or bruising seen.  Neurological: She is alert and oriented to person, place, and time. She has normal strength. No cranial nerve deficit.  Skin: Skin is warm, dry and intact. No rash noted. No erythema. No pallor.  Psychiatric: She has a normal mood and affect. Her speech is normal and  behavior is normal. Her mood appears not anxious.  Nursing note and vitals reviewed.    ED Treatments / Results  Labs (all labs ordered are listed, but only abnormal results are displayed) Labs Reviewed - No data to display  EKG  EKG Interpretation  Date/Time:  Friday April 18 2017 00:29:37 EST Ventricular Rate:  69 PR Interval:    QRS Duration: 96 QT Interval:  409 QTC Calculation: 439 R Axis:   -26 Text Interpretation:  Sinus rhythm Prolonged PR interval Borderline left axis deviation Consider  anterior infarct No significant change since last tracing 24 Nov 2016 Confirmed by Rolland Porter [phone] on 04/18/2017 12:43:43 AM       Radiology Ct Head Wo Contrast Ct Cervical Spine Wo Contrast  Result Date: 04/18/2017 CLINICAL DATA:  Fall, hit back of head EXAM: CT HEAD WITHOUT CONTRAST CT CERVICAL SPINE WITHOUT CONTRAST TECHNIQUE: Multidetector CT imaging of the head and cervical spine was performed following the standard protocol without intravenous contrast. Multiplanar CT image reconstructions of the cervical spine were also generated. COMPARISON:  CT brain 07/26/2016, CT cervical spine 12/20/2010 FINDINGS: CT HEAD FINDINGS Brain: No evidence of acute infarction, hemorrhage, hydrocephalus, extra-axial collection or mass lesion/mass effect. Mild small vessel ischemic changes of the white matter. Stable ventricle size Vascular: No hyperdense vessels.  Carotid vascular calcification Skull: Normal. Negative for fracture or focal lesion. Sinuses/Orbits: No acute finding. Other: Small posterior scalp hematoma at the vertex CT CERVICAL SPINE FINDINGS Alignment: Straightening of the cervical spine. Facet alignment within normal limits. Skull base and vertebrae: No acute fracture. Stable sclerotic focus in the T1 vertebral body likely a bone island. Soft tissues and spinal canal: No prevertebral fluid or swelling. No visible canal hematoma. Disc levels: Anterior plate and screw fixation C3 through C7 with interbody devices at C3-C4, C4-C5, C5-C6 and C6-C7. Solid bone fusion is present. Posterior stabilization rods and fixating screws C4 through C7. mild degenerative changes at C7-T1. Upper chest: Negative. Other: None IMPRESSION: 1. No CT evidence for acute intracranial abnormality. Atrophy and mild small vessel ischemic changes of the white matter. Small posterior scalp hematoma 2. Straightening of the cervical spine with anterior cervical fusion C3 through C7 and posterior spinal rods and fixating screws C4  through C7. No acute osseous abnormality. Electronically Signed   By: Donavan Foil M.D.   On: 04/18/2017 03:11   Dg Hip Unilat W Or Wo Pelvis 2-3 Views Right  Result Date: 04/18/2017 CLINICAL DATA:  77 year old female with fall.  Right hip pain. EXAM: DG HIP (WITH OR WITHOUT PELVIS) 2-3V RIGHT COMPARISON:  None. FINDINGS: There is no acute fracture or dislocation. 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 Partially visualized lower lumbar spine fixation hardware. The soft tissues appear unremarkable. IMPRESSION: No acute fracture or dislocation. Electronically Signed   By: Anner Crete M.D.   On: 04/18/2017 03:07    Procedures Procedures (including critical care time)  Medications Ordered in ED Medications  acetaminophen (TYLENOL) tablet 650 mg (650 mg Oral Given 04/18/17 0258)     Initial Impression / Assessment and Plan / ED Course  I have reviewed the triage vital signs and the nursing notes.  Pertinent labs & imaging results that were available during my care of the patient were reviewed by me and considered in my medical decision making (see chart for details).     Patient was given Tylenol for discomfort.  CT of the head and cervical spine was ordered and x-rays of her right hip were  done.  Review of the [facility] shows patient gets #120 hydrocodone 10/325 monthly, last filled January 29, and #30 alprazolam 0.5 mg tablets every few months, last filled January 31.  These were prescribed by her PCP.  Final Clinical Impressions(s) / ED Diagnoses   Final diagnoses:  Fall, initial encounter  Contusion of left temporofrontal scalp, initial encounter  Contusion of right hip, initial encounter    ED Discharge Orders    None      Plan discharge  Rolland Porter, MD, Barbette Or, MD 04/18/17 [phone]

## 2017-04-22 ENCOUNTER — Other Ambulatory Visit: Payer: Self-pay | Admitting: Family

## 2017-04-22 ENCOUNTER — Telehealth: Payer: Self-pay | Admitting: Family

## 2017-04-22 NOTE — Telephone Encounter (Signed)
Returned patient's phone call.  Informed patient on how to take atenolol, cartia, and lasix.  Patient verbalized understanding.

## 2017-04-24 ENCOUNTER — Ambulatory Visit: Payer: Medicare Other | Admitting: Family

## 2017-04-28 ENCOUNTER — Encounter: Payer: Self-pay | Admitting: Family

## 2017-04-30 NOTE — Telephone Encounter (Signed)
error 

## 2017-05-05 ENCOUNTER — Telehealth: Payer: Self-pay | Admitting: Family

## 2017-05-05 NOTE — Telephone Encounter (Signed)
Pt called in this morning to make an apt, she was very confused, had to repeat her appt date 10-20 times she was writing it down wrong each time, she didn't know what month it was. She thought month of March had already passed. I made Janie aware of the situation she is going to let [place] know. I called the pt daugther [name] and made her aware of her mother and she is going to contact the other sister to make her aware.

## 2017-05-06 ENCOUNTER — Encounter: Payer: Self-pay | Admitting: Family

## 2017-05-06 ENCOUNTER — Ambulatory Visit (INDEPENDENT_AMBULATORY_CARE_PROVIDER_SITE_OTHER): Payer: Medicare Other | Admitting: Family

## 2017-05-06 VITALS — BP 154/91 | HR 60 | Temp 98.4°F | Ht 62.0 in | Wt 159.2 lb

## 2017-05-06 DIAGNOSIS — N3001 Acute cystitis with hematuria: Secondary | ICD-10-CM | POA: Diagnosis not present

## 2017-05-06 DIAGNOSIS — K219 Gastro-esophageal reflux disease without esophagitis: Secondary | ICD-10-CM

## 2017-05-06 DIAGNOSIS — E782 Mixed hyperlipidemia: Secondary | ICD-10-CM | POA: Diagnosis not present

## 2017-05-06 DIAGNOSIS — I10 Essential (primary) hypertension: Secondary | ICD-10-CM

## 2017-05-06 DIAGNOSIS — F411 Generalized anxiety disorder: Secondary | ICD-10-CM

## 2017-05-06 DIAGNOSIS — R350 Frequency of micturition: Secondary | ICD-10-CM | POA: Diagnosis not present

## 2017-05-06 LAB — CMP14+EGFR
A/G RATIO: 1.4 (ref 1.2–2.2)
ALBUMIN: 3.8 g/dL (ref 3.5–4.8)
ALK PHOS: 109 IU/L (ref 39–117)
ALT: 12 IU/L (ref 0–32)
AST: 15 IU/L (ref 0–40)
BUN / CREAT RATIO: 18 (ref 12–28)
BUN: 11 mg/dL (ref 8–27)
Bilirubin Total: 0.6 mg/dL (ref 0.0–1.2)
CO2: 25 mmol/L (ref 20–29)
Calcium: 9.3 mg/dL (ref 8.7–10.3)
Chloride: 99 mmol/L (ref 96–106)
Creatinine, Ser: 0.61 mg/dL (ref 0.57–1.00)
GFR calc Af Amer: 102 mL/min/{1.73_m2} (ref >59)
GFR calc non Af Amer: 88 mL/min/{1.73_m2} (ref >59)
GLOBULIN, TOTAL: 2.7 g/dL (ref 1.5–4.5)
Glucose: 107 mg/dL — ABNORMAL HIGH (ref 65–99)
POTASSIUM: 4 mmol/L (ref 3.5–5.2)
SODIUM: 138 mmol/L (ref 134–144)
Total Protein: 6.5 g/dL (ref 6.0–8.5)

## 2017-05-06 LAB — URINALYSIS, COMPLETE
Bilirubin, UA: NEGATIVE
GLUCOSE, UA: NEGATIVE
Ketones, UA: NEGATIVE
LEUKOCYTES UA: NEGATIVE
Nitrite, UA: NEGATIVE
PROTEIN UA: NEGATIVE
Urobilinogen, Ur: 0.2 mg/dL (ref 0.2–1.0)
pH, UA: 5.5 (ref 5.0–7.5)

## 2017-05-06 LAB — MICROSCOPIC EXAMINATION
Bacteria, UA: NONE SEEN
RBC, UA: NONE SEEN /hpf (ref 0–?)

## 2017-05-06 MED ORDER — CIPROFLOXACIN HCL 500 MG PO TABS: 500.0000 mg | ORAL_TABLET | Freq: Two times a day (BID) | ORAL | 0 refills | Status: DC

## 2017-05-06 NOTE — Progress Notes (Signed)
Subjective:    Patient ID: Kaitlyn Serrano, female    DOB: 03-14-1940, 77 y.o.   MRN: [id number]  Pt presents to the office today for chronic follow up.  Hypertension  This is a chronic problem. The current episode started more than 1 year ago. The problem is unchanged. The problem is uncontrolled. Associated symptoms include anxiety. Pertinent negatives include no peripheral edema or shortness of breath. Risk factors for coronary artery disease include dyslipidemia, obesity and sedentary lifestyle. The current treatment provides moderate improvement. There is no history of kidney disease, CAD/MI, CVA or heart failure.  Anxiety  Presents for follow-up visit. Symptoms include depressed mood, excessive worry, irritability, nervous/anxious behavior and restlessness. Patient reports no shortness of breath. Symptoms occur occasionally.    Hyperlipidemia  This is a chronic problem. The current episode started more than 1 year ago. The problem is uncontrolled. Recent lipid tests were reviewed and are high. Exacerbating diseases include obesity. Pertinent negatives include no shortness of breath. The current treatment provides moderate improvement of lipids. Risk factors for coronary artery disease include dyslipidemia, hypertension and a sedentary lifestyle.  Gastroesophageal Reflux  She reports no belching, no choking or no heartburn. This is a chronic problem. The current episode started more than 1 year ago. The problem occurs occasionally. The problem has been waxing and waning. The symptoms are aggravated by certain foods. Risk factors include obesity. She has tried a PPI for the symptoms. The treatment provided moderate relief.  Urinary Frequency   This is a new problem. The current episode started 1 to 4 weeks ago. The problem occurs intermittently. The problem has been waxing and waning. The quality of the pain is described as burning. The pain is at a severity of 2/10. The pain is mild.  Associated symptoms include frequency and urgency. She has tried increased fluids for the symptoms.      Review of Systems  Constitutional: Positive for irritability.  Respiratory: Negative for choking and shortness of breath.   Gastrointestinal: Negative for heartburn.  Genitourinary: Positive for frequency and urgency.  Psychiatric/Behavioral: The patient is nervous/anxious.   All other systems reviewed and are negative.      Objective:   Physical Exam  Constitutional: She is oriented to person, place, and time. She appears well-developed and well-nourished. No distress.  HENT:  Head: Normocephalic and atraumatic.  Right Ear: External ear normal.  Left Ear: External ear normal.  Nose: Nose normal.  Mouth/Throat: Oropharynx is clear and moist.  Eyes: Pupils are equal, round, and reactive to light.  Neck: Normal range of motion. Neck supple. No thyromegaly present.  Cardiovascular: Normal rate, regular rhythm, normal heart sounds and intact distal pulses.  No murmur heard. Pulmonary/Chest: Effort normal and breath sounds normal. No respiratory distress. She has no wheezes.  Abdominal: Soft. Bowel sounds are normal. She exhibits no distension. There is tenderness (mild lower abd).  Musculoskeletal: Normal range of motion. She exhibits no edema or tenderness.  Neurological: She is alert and oriented to person, place, and time.  Skin: Skin is warm and dry.  Psychiatric: She has a normal mood and affect. Her behavior is normal. Thought content normal. She expresses impulsivity.  Vitals reviewed.     BP (!) 154/91   Pulse 60   Temp 98.4 F (36.9 C) (Oral)   Ht _0  (1.575 m)   Wt 159 lb 3.2 oz (72.2 kg)   BMI 29.12 kg/m      Assessment & Plan:  1. Essential  hypertension - CMP14+EGFR  2. Urinary frequency - Urinalysis, Complete - Urine Culture - CMP14+EGFR  3. Mixed hyperlipidemia - CMP14+EGFR  4. GAD (generalized anxiety disorder) - CMP14+EGFR  5.  Gastroesophageal reflux disease without esophagitis - CMP14+EGFR  6. Acute cystitis with hematuria Force fluids AZO over the counter X2 days RTO prn Culture pending - ciprofloxacin (CIPRO) 500 MG tablet; Take 1 tablet (500 mg total) by mouth 2 (two) times daily.  Dispense: 14 tablet; Refill: 0 - CMP14+EGFR   Continue all meds Labs pending Health Maintenance reviewed Diet and exercise encouraged RTO 4 months   Evelina Dun, FNP

## 2017-05-06 NOTE — Patient Instructions (Signed)

## 2017-05-08 ENCOUNTER — Other Ambulatory Visit: Payer: Self-pay | Admitting: Family

## 2017-05-08 LAB — URINE CULTURE

## 2017-05-08 MED ORDER — NITROFURANTOIN MONOHYD MACRO 100 MG PO CAPS: 100.0000 mg | ORAL_CAPSULE | Freq: Two times a day (BID) | ORAL | 0 refills | Status: DC

## 2017-05-10 ENCOUNTER — Telehealth: Payer: Self-pay | Admitting: Family

## 2017-05-10 NOTE — Telephone Encounter (Signed)
Patient told to pick up ABX at [facility]

## 2017-05-13 ENCOUNTER — Other Ambulatory Visit: Payer: Medicare Other

## 2017-05-13 DIAGNOSIS — R8290 Unspecified abnormal findings in urine: Secondary | ICD-10-CM

## 2017-05-16 ENCOUNTER — Other Ambulatory Visit: Payer: Self-pay | Admitting: Family

## 2017-05-16 DIAGNOSIS — F411 Generalized anxiety disorder: Secondary | ICD-10-CM

## 2017-05-19 ENCOUNTER — Ambulatory Visit: Payer: Medicare Other | Admitting: Family

## 2017-05-23 ENCOUNTER — Encounter: Payer: Self-pay | Admitting: Family

## 2017-05-23 ENCOUNTER — Ambulatory Visit (INDEPENDENT_AMBULATORY_CARE_PROVIDER_SITE_OTHER): Payer: Medicare Other | Admitting: Family

## 2017-05-23 VITALS — BP 174/90 | HR 102 | Temp 98.1°F | Ht 62.0 in | Wt 155.2 lb

## 2017-05-23 DIAGNOSIS — I10 Essential (primary) hypertension: Secondary | ICD-10-CM | POA: Diagnosis not present

## 2017-05-23 DIAGNOSIS — N3001 Acute cystitis with hematuria: Secondary | ICD-10-CM | POA: Diagnosis not present

## 2017-05-23 DIAGNOSIS — Z87440 Personal history of urinary (tract) infections: Secondary | ICD-10-CM | POA: Diagnosis not present

## 2017-05-23 DIAGNOSIS — F411 Generalized anxiety disorder: Secondary | ICD-10-CM

## 2017-05-23 LAB — MICROSCOPIC EXAMINATION
Epithelial Cells (non renal): >10 /hpf — AB (ref 0–10)
Renal Epithel, UA: NONE SEEN /hpf

## 2017-05-23 LAB — URINALYSIS, COMPLETE
BILIRUBIN UA: NEGATIVE
Glucose, UA: NEGATIVE
KETONES UA: NEGATIVE
LEUKOCYTES UA: NEGATIVE
Nitrite, UA: NEGATIVE
Protein, UA: NEGATIVE
RBC UA: NEGATIVE
Specific Gravity, UA: 1.02 (ref 1.005–1.030)
Urobilinogen, Ur: 0.2 mg/dL (ref 0.2–1.0)
pH, UA: 6 (ref 5.0–7.5)

## 2017-05-23 MED ORDER — ATENOLOL 50 MG PO TABS: 50.0000 mg | ORAL_TABLET | Freq: Every day | ORAL | 1 refills | Status: DC

## 2017-05-23 MED ORDER — LISINOPRIL 10 MG PO TABS: 10.0000 mg | ORAL_TABLET | Freq: Every day | ORAL | 3 refills | Status: DC

## 2017-05-23 MED ORDER — DILTIAZEM HCL ER COATED BEADS 120 MG PO CP24: 120.0000 mg | ORAL_CAPSULE | Freq: Every day | ORAL | 1 refills | Status: DC

## 2017-05-23 MED ORDER — TRAZODONE HCL 50 MG PO TABS: 50.0000 mg | ORAL_TABLET | Freq: Every day | ORAL | 0 refills | Status: DC

## 2017-05-23 MED ORDER — FUROSEMIDE 20 MG PO TABS: 20.0000 mg | ORAL_TABLET | Freq: Every day | ORAL | 3 refills | Status: DC

## 2017-05-23 MED ORDER — DULOXETINE HCL 30 MG PO CPEP: ORAL_CAPSULE | ORAL | 0 refills | Status: DC

## 2017-05-23 MED ORDER — BUSPIRONE HCL 7.5 MG PO TABS: ORAL_TABLET | ORAL | 1 refills | Status: DC

## 2017-05-23 MED ORDER — ALPRAZOLAM 0.5 MG PO TABS: ORAL_TABLET | ORAL | 3 refills | Status: DC

## 2017-05-23 NOTE — Progress Notes (Signed)
   Subjective:    Patient ID: Kaitlyn Serrano, female    DOB: 1940-09-28, 77 y.o.   MRN: [id number]  Pt presents to the office today to recheck UTI. PT was seen on 05/06/17 and started on cipro, but told to stop once we received the culture and told to start Britt.   She reports her urine has improved, but still wants to have it checked. Hypertension  This is a chronic problem. The current episode started more than 1 year ago. The problem has been waxing and waning since onset. The problem is uncontrolled. Pertinent negatives include no headaches, peripheral edema or shortness of breath. There is no history of CAD/MI or heart failure.       Review of Systems  Respiratory: Negative for shortness of breath.   Neurological: Negative for headaches.  All other systems reviewed and are negative.      Objective:   Physical Exam  Constitutional: She is oriented to person, place, and time. She appears well-developed and well-nourished. No distress.  HENT:  Head: Normocephalic and atraumatic.  Right Ear: External ear normal.  Left Ear: External ear normal.  Nose: Nose normal.  Mouth/Throat: Oropharynx is clear and moist.  Eyes: Pupils are equal, round, and reactive to light.  Neck: Normal range of motion. Neck supple. No thyromegaly present.  Cardiovascular: Normal rate, regular rhythm, normal heart sounds and intact distal pulses.  No murmur heard. Pulmonary/Chest: Effort normal and breath sounds normal. No respiratory distress. She has no wheezes.  Abdominal: Soft. Bowel sounds are normal. She exhibits no distension. There is no tenderness.  Musculoskeletal: Normal range of motion. She exhibits no edema or tenderness.  Neurological: She is alert and oriented to person, place, and time.  Skin: Skin is warm and dry.  Psychiatric: She has a normal mood and affect. Her behavior is normal. Judgment and thought content normal.  Vitals reviewed.    BP (!) 174/90   Pulse (!) 102    Temp 98.1 F (36.7 C) (Oral)   Ht _0  (1.575 m)   Wt 155 lb 3.2 oz (70.4 kg)   BMI 28.39 kg/m      Assessment & Plan:  1. Essential hypertension Will add Lisinopril 10 mg  We will decrease lasix to 20 mg from 40 mg  Continue medications -Dash diet information given -Exercise encouraged - Stress Management  -Continue current meds - BMP8+EGFR - lisinopril (PRINIVIL,ZESTRIL) 10 MG tablet; Take 1 tablet (10 mg total) by mouth daily.  Dispense: 90 tablet; Refill: 3  2. GAD (generalized anxiety disorder) - BMP8+EGFR  3. Hx: UTI (urinary tract infection) - Urinalysis, Complete - Urine Culture - BMP8+EGFR  4. Acute cystitis with hematuria - Urinalysis, Complete - Urine Culture - BMP8+EGFR   Continue all meds Labs pending Health Maintenance reviewed Diet and exercise encouraged RTO 2 weeks to recheck HTN  Evelina Dun, FNP

## 2017-05-23 NOTE — Patient Instructions (Signed)

## 2017-05-24 LAB — BMP8+EGFR
BUN/Creatinine Ratio: 22 (ref 12–28)
BUN: 17 mg/dL (ref 8–27)
CALCIUM: 9.7 mg/dL (ref 8.7–10.3)
CHLORIDE: 98 mmol/L (ref 96–106)
CO2: 26 mmol/L (ref 20–29)
Creatinine, Ser: 0.79 mg/dL (ref 0.57–1.00)
GFR, EST AFRICAN AMERICAN: 84 mL/min/{1.73_m2} (ref >59)
GFR, EST NON AFRICAN AMERICAN: 73 mL/min/{1.73_m2} (ref >59)
Glucose: 119 mg/dL — ABNORMAL HIGH (ref 65–99)
Potassium: 4 mmol/L (ref 3.5–5.2)
Sodium: 137 mmol/L (ref 134–144)

## 2017-05-25 LAB — URINE CULTURE: Organism ID, Bacteria: NO GROWTH

## 2017-06-05 ENCOUNTER — Other Ambulatory Visit: Payer: Self-pay | Admitting: Gastroenterology

## 2017-06-06 ENCOUNTER — Encounter: Payer: Self-pay | Admitting: Family

## 2017-06-06 ENCOUNTER — Ambulatory Visit (INDEPENDENT_AMBULATORY_CARE_PROVIDER_SITE_OTHER): Payer: Medicare Other | Admitting: Family

## 2017-06-06 VITALS — BP 131/80 | HR 70 | Temp 98.8°F | Ht 62.0 in | Wt 157.0 lb

## 2017-06-06 DIAGNOSIS — I10 Essential (primary) hypertension: Secondary | ICD-10-CM | POA: Diagnosis not present

## 2017-06-06 MED ORDER — DICYCLOMINE HCL 10 MG PO CAPS: ORAL_CAPSULE | ORAL | 0 refills | Status: DC

## 2017-06-06 NOTE — Patient Instructions (Signed)

## 2017-06-06 NOTE — Progress Notes (Signed)
   Subjective:    Patient ID: Kaitlyn Serrano, female    DOB: 12/21/1940, 76 y.o.   MRN: [id number]  Pt presents to the office today to recheck HTN. PT"s BP is at goal today. Hypertension  This is a chronic problem. The current episode started more than 1 year ago. The problem has been resolved since onset. The problem is controlled. Associated symptoms include malaise/fatigue. Pertinent negatives include no headaches, peripheral edema or shortness of breath. The current treatment provides moderate improvement.      Review of Systems  Constitutional: Positive for malaise/fatigue.  Respiratory: Negative for shortness of breath.   Neurological: Negative for headaches.  All other systems reviewed and are negative.      Objective:   Physical Exam  Constitutional: She is oriented to person, place, and time. She appears well-developed and well-nourished. No distress.  HENT:  Head: Normocephalic.  Eyes: Pupils are equal, round, and reactive to light.  Neck: Normal range of motion. Neck supple. No thyromegaly present.  Cardiovascular: Normal rate, regular rhythm, normal heart sounds and intact distal pulses.  No murmur heard. Pulmonary/Chest: Effort normal and breath sounds normal. No respiratory distress. She has no wheezes.  Abdominal: Soft. Bowel sounds are normal. She exhibits no distension. There is no tenderness.  Musculoskeletal: Normal range of motion. She exhibits no edema or tenderness.  Neurological: She is alert and oriented to person, place, and time.  Skin: Skin is warm and dry.  Psychiatric: She has a normal mood and affect. Her behavior is normal. Judgment and thought content normal.  Vitals reviewed.     BP 131/80   Pulse 70   Temp 98.8 F (37.1 C) (Oral)   Ht 5' 2" (1.575 m)   Wt 157 lb (71.2 kg)   BMI 28.72 kg/m      Assessment & Plan:  1. Essential hypertension -Dash diet information given -Exercise encouraged - Stress Management  -Continue current  meds -RTO in 3 months  - BMP8+EGFR   , FNP  

## 2017-06-07 LAB — BMP8+EGFR
BUN / CREAT RATIO: 19 (ref 12–28)
BUN: 11 mg/dL (ref 8–27)
CO2: 25 mmol/L (ref 20–29)
Calcium: 9.5 mg/dL (ref 8.7–10.3)
Chloride: 102 mmol/L (ref 96–106)
Creatinine, Ser: 0.58 mg/dL (ref 0.57–1.00)
GFR, EST AFRICAN AMERICAN: 104 mL/min/{1.73_m2} (ref >59)
GFR, EST NON AFRICAN AMERICAN: 90 mL/min/{1.73_m2} (ref >59)
Glucose: 78 mg/dL (ref 65–99)
POTASSIUM: 4.5 mmol/L (ref 3.5–5.2)
SODIUM: 140 mmol/L (ref 134–144)

## 2017-06-18 ENCOUNTER — Other Ambulatory Visit: Payer: Self-pay | Admitting: Family

## 2017-06-18 DIAGNOSIS — J069 Acute upper respiratory infection, unspecified: Secondary | ICD-10-CM

## 2017-07-09 ENCOUNTER — Ambulatory Visit: Payer: Medicare Other | Admitting: Cardiology

## 2017-07-09 ENCOUNTER — Encounter: Payer: Self-pay | Admitting: Cardiology

## 2017-07-09 ENCOUNTER — Encounter: Payer: Self-pay | Admitting: *Deleted

## 2017-07-09 DIAGNOSIS — R0989 Other specified symptoms and signs involving the circulatory and respiratory systems: Secondary | ICD-10-CM

## 2017-07-09 NOTE — Progress Notes (Deleted)
Clinical Summary Kaitlyn Serrano is a 77 y.o.female seen today for follow up of the following medical problems.   1. Leg swelling - echo showed LVEF 75-10%, grade I diastolic dysfunction.  - improved after stopping norvasc.  - no recent issues   2Chest pain - previous workup in 2012 per old [place] notes with negative myoview for ischemia. Echo showed normal LV function -05/2014 completed exercise MPI. Low risk [facility] treadmill score of 6, no ischemia on imaging.  - can have some occasional chest tightness. Can occur at rest or with activity. Tightness midchest, 7/10. Worst with deep breathing. Lasts just a few seconds. + SOB. Occurs once a week. Started about 1 month ago. Has had increase in frequency and severity since onset. Better with aspirin. DOE at 1 block. Can have some dysphagia solids and liquids.   - upcoming GI evaluation  EGD  3. HTN - does not check regulalrly at home -compliant with meds  4. Hyperlipidemia - stopped zocor due to leg pains recently  - tolerating pravastatin 20mg  twice a week since last visit   5. Palpitations - seen in ER 11/2015 with palpitations. Telemetry monitor throughtout visit showed NSR per ER notes. CT PE negative, negative evaluation for ACS - symptoms come and go over the last few months. Occurs one a month, lasts 1-2 days constantly.      Past Medical History:  Diagnosis Date  . Asthma   . Blood transfusion   . Blurred vision   . CAD (coronary artery disease)   . Chest pain   . CHF (congestive heart failure) ([place])   . Chronic pain syndrome   . Cough   . Cyanosis   . Diabetes ([place])   . Diverticulosis   . Double vision   . 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 Dysphagia   . Dyspnea   . Exposure to TB   . Facial pain   . Generalized weakness   . GERD (gastroesophageal reflux disease)   . GERD (gastroesophageal reflux disease)   . Glaucoma   . Hearing loss   . Heart murmur   . Hemorrhoids   . Hemorrhoids    . High cholesterol   . Hoarseness   . Hx of cardiovascular stress test    negative 2012 and 2016  . Hypertension   . IBS (irritable bowel syndrome)   . Insomnia   . Intervertebral disc disorder with myelopathy, lumbar region   . Irregular heart beat   . Leg swelling   . Lumbago   . Lumbar spine pain   . MI (myocardial infarction) ([place])   . Nasal congestion   . Night sweats   . Palpitations   . Rectal bleeding   . Renal insufficiency   . Ringing in ears   . Sinus infection   . Spondylosis with myelopathy, lumbar region   . Syncope   . Valvular heart disease   . Vertigo   . Vision loss   . Visual disorder   . Wheezing      Allergies  Allergen Reactions  . Bee Venom Anaphylaxis  . Amlodipine     swelling  . Elavil [Amitriptyline Hcl] Other (See Comments)    confusion  . Statins Swelling and Other (See Comments)    Peeling of skin  . Penicillins Rash    To mouth     Current Outpatient Medications  Medication Sig Dispense Refill  . ALPRAZolam (XANAX) 0.5 MG tablet TAKE ONE-HALF TABLET  BY MOUTH TWICE DAILY AS NEEDED FOR ANXIETY /SLEEP 60 tablet 3  . atenolol (TENORMIN) 50 MG tablet Take 1 tablet (50 mg total) by mouth daily. 90 tablet 1  . busPIRone (BUSPAR) 7.5 MG tablet TAKE  (1)  TABLET  THREE TIMES DAILY. 60 tablet 1  . dicyclomine (BENTYL) 10 MG capsule TAKE (1) CAPSULE TWICE DAILY. 60 capsule 0  . diltiazem (CARTIA XT) 120 MG 24 hr capsule Take 1 capsule (120 mg total) by mouth daily. 90 capsule 1  . DULoxetine (CYMBALTA) 30 MG capsule TAKE (1) CAPSULE DAILY 90 capsule 0  . EPINEPHrine 0.3 mg/0.3 mL IJ SOAJ injection Inject 0.3 mLs (0.3 mg total) into the muscle once. 2 Device 1  . FEMRING 0.05 MG/24HR RING INSERT (1) INTO THE VAGINA AS DIR- ECTED. -FOR VAGINAL USE- 1 each 4  . fluticasone (FLONASE) 50 MCG/ACT nasal spray Place 2 sprays daily into both nostrils. 16 g 6  . furosemide (LASIX) 20 MG tablet Take 1 tablet (20 mg total) by mouth daily. 30 tablet 3    . lisinopril (PRINIVIL,ZESTRIL) 10 MG tablet Take 1 tablet (10 mg total) by mouth daily. 90 tablet 3  . loratadine (CLARITIN) 10 MG tablet Take 1 tablet (10 mg total) daily by mouth. 30 tablet 11  . polyethylene glycol powder (GLYCOLAX/MIRALAX) powder Take 17 g by mouth 2 (two) times daily as needed. 3162 g 0  . traZODone (DESYREL) 50 MG tablet TAKE ONE TABLET AT BEDTIME 90 tablet 0   No current facility-administered medications for this visit.      Past Surgical History:  Procedure Laterality Date  . ABDOMINAL HYSTERECTOMY    . BACK SURGERY    . BONE MARROW ASPIRATION    . CERVICAL SPINE SURGERY    . FIXATION KYPHOPLASTY    . LAMINOTOMY    . SPINAL FUSION       Allergies  Allergen Reactions  . Bee Venom Anaphylaxis  . Amlodipine     swelling  . Elavil [Amitriptyline Hcl] Other (See Comments)    confusion  . Statins Swelling and Other (See Comments)    Peeling of skin  . Penicillins Rash    To mouth      Family History  Problem Relation Age of Onset  . Diabetes Mother   . Heart disease Mother   . Hypertension Mother   . Gallbladder disease Mother   . Heart disease Father   . Pneumonia Sister   . Hypertension Brother   . Hyperlipidemia Brother   . Gallbladder disease Unknown   . Hypertension Child      Social History [name] reports that she quit smoking about 53 years ago. Her smoking use included cigarettes. She started smoking about 57 years ago. She has a 12.00 pack-year smoking history. She has never used smokeless tobacco. Ms. Henigan reports that she does not drink alcohol.   Review of Systems CONSTITUTIONAL: No weight loss, fever, chills, weakness or fatigue.  HEENT: Eyes: No visual loss, blurred vision, double vision or yellow sclerae.No hearing loss, sneezing, congestion, runny nose or sore throat.  SKIN: No rash or itching.  CARDIOVASCULAR:  RESPIRATORY: No shortness of breath, cough or sputum.  GASTROINTESTINAL: No anorexia, nausea,  vomiting or diarrhea. No abdominal pain or blood.  GENITOURINARY: No burning on urination, no polyuria NEUROLOGICAL: No headache, dizziness, syncope, paralysis, ataxia, numbness or tingling in the extremities. No change in bowel or bladder control.  MUSCULOSKELETAL: No muscle, back pain, joint pain or stiffness.  LYMPHATICS: No enlarged nodes. No history of splenectomy.  PSYCHIATRIC: No history of depression or anxiety.  ENDOCRINOLOGIC: No reports of sweating, cold or heat intolerance. No polyuria or polydipsia.  Marland Kitchen   Physical Examination There were no vitals filed for this visit. There were no vitals filed for this visit.  Gen: resting comfortably, no acute distress HEENT: no scleral icterus, pupils equal round and reactive, no palptable cervical adenopathy,  CV Resp: Clear to auscultation bilaterally GI: abdomen is soft, non-tender, non-distended, normal bowel sounds, no hepatosplenomegaly MSK: extremities are warm, no edema.  Skin: warm, no rash Neuro:  no focal deficits Psych: appropriate affect   Diagnostic Studies Echo 04/2010 [facility] LVEF >93%, grade I diastolic dysfunction, mild MR  04/2010 ExerciseMyoview No ischemia. Exercised 8 minutes, 9 METs, 95% THR.  12/2007 Renal artery [place] No stenosis, normal kidney size.   06/01/13 Clinic EKG NSR, LAD  05/2014 Exercise MPI IMPRESSION: 1. No reversible ischemia or infarction.  2. Normal left ventricular wall motion.  3. Left ventricular ejection fraction 57%  4. Low-risk stress test findings*.   08/2014 Echo Study Conclusions  - Left ventricle: The cavity size was normal. Wall thickness was normal. Systolic function was normal. The estimated ejection fraction was in the range of 60% to 65%. Wall motion was normal; there were no regional wall motion abnormalities. Doppler parameters are consistent with abnormal left ventricular relaxation (grade 1 diastolic dysfunction). - Aortic valve: Mildly  calcified annulus. Trileaflet. - Mitral valve: Mildly calcified annulus. Mildly calcified leaflets . There was mild regurgitation. - Tricuspid valve: There was mild regurgitation.     Assessment and Plan   1. Leg swelling - resolved, appears to have been related to recently starting norvasc - we will continue to monitor at this time.   2. Chest pain - several year history, negative stress testing in 2012 and 2016 - fairly significant dysphagia symptoms, may be related.She has upcoming GI evaluation and EGD   3. HTN -at goal, continue current meds  4. Hyperlipidemia - side effects on statins. Tolerating pravastatin 20mg  2 days a week, we will continue    F/u 6 months      Arnoldo Lenis, M.D., F.A.C.C.

## 2017-07-15 ENCOUNTER — Ambulatory Visit (INDEPENDENT_AMBULATORY_CARE_PROVIDER_SITE_OTHER): Payer: Medicare Other | Admitting: Family Medicine

## 2017-07-15 ENCOUNTER — Encounter: Payer: Self-pay | Admitting: Family Medicine

## 2017-07-15 VITALS — BP 139/84 | HR 86 | Temp 97.5°F | Ht 62.0 in | Wt 153.0 lb

## 2017-07-15 DIAGNOSIS — L03113 Cellulitis of right upper limb: Secondary | ICD-10-CM

## 2017-07-15 DIAGNOSIS — S51811A Laceration without foreign body of right forearm, initial encounter: Secondary | ICD-10-CM

## 2017-07-15 MED ORDER — DOXYCYCLINE HYCLATE 100 MG PO TABS: 100.0000 mg | ORAL_TABLET | Freq: Two times a day (BID) | ORAL | 0 refills | Status: DC

## 2017-07-15 NOTE — Patient Instructions (Signed)
I prescribed you doxycycline to take twice a day for the next 10 days to cover for infection.  Make sure that you take this medication with a full meal and plenty of water.  If you are outside, make sure that you have sunscreen on.  If your symptoms worsen, you develop fever, please seek immediate medical attention.   Cellulitis, Adult Cellulitis is a skin infection. The infected area is usually red and sore. This condition occurs most often in the arms and lower legs. It is very important to get treated for this condition. Follow these instructions at home:  Take over-the-counter and prescription medicines only as told by your doctor.  If you were prescribed an antibiotic medicine, take it as told by your doctor. Do not stop taking the antibiotic even if you start to feel better.  Drink enough fluid to keep your pee (urine) clear or pale yellow.  Do not touch or rub the infected area.  Raise (elevate) the infected area above the level of your heart while you are sitting or lying down.  Place warm or cold wet cloths (warm or cold compresses) on the infected area. Do this as told by your doctor.  Keep all follow-up visits as told by your doctor. This is important. These visits let your doctor make sure your infection is not getting worse. Contact a doctor if:  You have a fever.  Your symptoms do not get better after 1-2 days of treatment.  Your bone or joint under the infected area starts to hurt after the skin has healed.  Your infection comes back. This can happen in the same area or another area.  You have a swollen bump in the infected area.  You have new symptoms.  You feel ill and also have muscle aches and pains. Get help right away if:  Your symptoms get worse.  You feel very sleepy.  You throw up (vomit) or have watery poop (diarrhea) for a long time.  There are red streaks coming from the infected area.  Your red area gets larger.  Your red area turns  darker. This information is not intended to replace advice given to you by your health care provider. Make sure you discuss any questions you have with your health care provider. Document Released: 07/31/2007 Document Revised: 07/20/2015 Document Reviewed: 12/21/2014 Elsevier Interactive Patient Education  2018 [employer].

## 2017-07-15 NOTE — Progress Notes (Signed)
Subjective: CC: skin tear PCP: Sharion Balloon, FNP [id number] Kaitlyn Serrano is a 76 y.o. female presenting to clinic today for:  1. Skin tear Patient reports that she sustained a skin tear to the right forearm about 2 days ago.  She notes she got her arm hung on something.  She reports that she has had increased pain, swelling and has noticed some purulent discharge.  For this reason she came in for evaluation.  Denies any fevers.  She has not been applying anything to the affected area.  She notes a penicillin allergy.  Tetanus is up-to-date.   ROS: Per HPI  Allergies  Allergen Reactions  . Bee Venom Anaphylaxis  . Amlodipine     swelling  . Elavil [Amitriptyline Hcl] Other (See Comments)    confusion  . Statins Swelling and Other (See Comments)    Peeling of skin  . Penicillins Rash    To mouth   Past Medical History:  Diagnosis Date  . Asthma   . Blood transfusion   . Blurred vision   . CAD (coronary artery disease)   . Chest pain   . CHF (congestive heart failure) ([place])   . Chronic pain syndrome   . Cough   . Cyanosis   . Diabetes ([place])   . Diverticulosis   . Double vision   . 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 Dysphagia   . Dyspnea   . Exposure to TB   . Facial pain   . Generalized weakness   . GERD (gastroesophageal reflux disease)   . GERD (gastroesophageal reflux disease)   . Glaucoma   . Hearing loss   . Heart murmur   . Hemorrhoids   . Hemorrhoids   . High cholesterol   . Hoarseness   . Hx of cardiovascular stress test    negative 2012 and 2016  . Hypertension   . IBS (irritable bowel syndrome)   . Insomnia   . Intervertebral disc disorder with myelopathy, lumbar region   . Irregular heart beat   . Leg swelling   . Lumbago   . Lumbar spine pain   . MI (myocardial infarction) ([place])   . Nasal congestion   . Night sweats   . Palpitations   . Rectal bleeding   . Renal insufficiency   . Ringing in ears   . Sinus infection   . Spondylosis  with myelopathy, lumbar region   . Syncope   . Valvular heart disease   . Vertigo   . Vision loss   . Visual disorder   . Wheezing     Current Outpatient Medications:  .  ALPRAZolam (XANAX) 0.5 MG tablet, TAKE ONE-HALF TABLET BY MOUTH TWICE DAILY AS NEEDED FOR ANXIETY /SLEEP, Disp: 60 tablet, Rfl: 3 .  atenolol (TENORMIN) 50 MG tablet, Take 1 tablet (50 mg total) by mouth daily., Disp: 90 tablet, Rfl: 1 .  busPIRone (BUSPAR) 7.5 MG tablet, TAKE  (1)  TABLET  THREE TIMES DAILY., Disp: 60 tablet, Rfl: 1 .  dicyclomine (BENTYL) 10 MG capsule, TAKE (1) CAPSULE TWICE DAILY., Disp: 60 capsule, Rfl: 0 .  diltiazem (CARTIA XT) 120 MG 24 hr capsule, Take 1 capsule (120 mg total) by mouth daily., Disp: 90 capsule, Rfl: 1 .  DULoxetine (CYMBALTA) 30 MG capsule, TAKE (1) CAPSULE DAILY, Disp: 90 capsule, Rfl: 0 .  EPINEPHrine 0.3 mg/0.3 mL IJ SOAJ injection, Inject 0.3 mLs (0.3 mg total) into the muscle once., Disp: 2 Device, Rfl:  1 .  FEMRING 0.05 MG/24HR RING, INSERT (1) INTO THE VAGINA AS DIR- ECTED. -FOR VAGINAL USE-, Disp: 1 each, Rfl: 4 .  fluticasone (FLONASE) 50 MCG/ACT nasal spray, Place 2 sprays daily into both nostrils., Disp: 16 g, Rfl: 6 .  furosemide (LASIX) 20 MG tablet, Take 1 tablet (20 mg total) by mouth daily., Disp: 30 tablet, Rfl: 3 .  lisinopril (PRINIVIL,ZESTRIL) 10 MG tablet, Take 1 tablet (10 mg total) by mouth daily., Disp: 90 tablet, Rfl: 3 .  loratadine (CLARITIN) 10 MG tablet, Take 1 tablet (10 mg total) daily by mouth., Disp: 30 tablet, Rfl: 11 .  polyethylene glycol powder (GLYCOLAX/MIRALAX) powder, Take 17 g by mouth 2 (two) times daily as needed., Disp: 3162 g, Rfl: 0 .  traZODone (DESYREL) 50 MG tablet, TAKE ONE TABLET AT BEDTIME, Disp: 90 tablet, Rfl: 0 Social History   Socioeconomic History  . Marital status: Married    Spouse name: Not on file  . Number of children: Not on file  . Years of education: Not on file  . Highest education level: Not on file    Occupational History  . Not on file  Social Needs  . Financial resource strain: Not on file  . Food insecurity:    Worry: Not on file    Inability: Not on file  . Transportation needs:    Medical: Not on file    Non-medical: Not on file  Tobacco Use  . Smoking status: Former Smoker    Packs/day: 4.00    Years: 3.00    Pack years: 12.00    Types: Cigarettes    Start date: 06/10/1960    Last attempt to quit: 02/20/1964    Years since quitting: 53.4  . Smokeless tobacco: Never Used  Substance and Sexual Activity  . Alcohol use: No    Alcohol/week: 0.0 oz  . Drug use: No  . Sexual activity: Yes    Birth control/protection: None  Lifestyle  . Physical activity:    Days per week: Not on file    Minutes per session: Not on file  . Stress: Not on file  Relationships  . Social connections:    Talks on phone: Not on file    Gets together: Not on file    Attends religious service: Not on file    Active member of club or organization: Not on file    Attends meetings of clubs or organizations: Not on file    Relationship status: Not on file  . Intimate partner violence:    Fear of current or ex partner: Not on file    Emotionally abused: Not on file    Physically abused: Not on file    Forced sexual activity: Not on file  Other Topics Concern  . Not on file  Social History Narrative   ** Merged History Encounter **       Family History  Problem Relation Age of Onset  . Diabetes Mother   . Heart disease Mother   . Hypertension Mother   . Gallbladder disease Mother   . Heart disease Father   . Pneumonia Sister   . Hypertension Brother   . Hyperlipidemia Brother   . Gallbladder disease Unknown   . Hypertension Child     Objective: Office vital signs reviewed. BP 139/84   Pulse 86   Temp (!) 97.5 F (36.4 C) (Oral)   Ht 5\' 2"  (1.575 m)   Wt 153 lb (69.4 kg)   BMI 27.98  kg/m   Physical Examination:  General: Awake, alert, well nourished, No acute  distress Extremities: warm, well perfused, No edema, cyanosis or clubbing; +2 pulses bilaterally Skin: dry; she has a 2 cm vertical skin tear that is hemostatic.  There is associated crusting within the lesion.  There is a large amount of ecchymosis surrounding the skin tear.  There is increased warmth and mild swelling.  No palpable fluctuance.  No expressible purulence.  Assessment/ Plan: 77 y.o. female   1. Skin tear of right forearm without complication, initial encounter Up-to-date on tetanus shot.  Will proceed with treatment for cellulitis given discharge and increased warmth on exam.  She is penicillin allergic.  Prescribed doxycycline 100 mg p.o. twice daily for the next 10 days.  Home care instructions were reviewed.  Reasons for return evaluation discussed.  Follow-up as needed.  2. Cellulitis of right upper extremity   Meds ordered this encounter  Medications  . doxycycline (VIBRA-TABS) 100 MG tablet    Sig: Take 1 tablet (100 mg total) by mouth 2 (two) times daily.    Dispense:  20 tablet    Refill:  [place], DO [facility] [phone]

## 2017-08-04 ENCOUNTER — Ambulatory Visit (INDEPENDENT_AMBULATORY_CARE_PROVIDER_SITE_OTHER): Payer: Medicare Other

## 2017-08-04 ENCOUNTER — Encounter: Payer: Self-pay | Admitting: Family

## 2017-08-04 ENCOUNTER — Ambulatory Visit (INDEPENDENT_AMBULATORY_CARE_PROVIDER_SITE_OTHER): Payer: Medicare Other | Admitting: Family

## 2017-08-04 VITALS — BP 179/87 | HR 74 | Temp 97.0°F | Ht 62.0 in | Wt 149.4 lb

## 2017-08-04 DIAGNOSIS — I10 Essential (primary) hypertension: Secondary | ICD-10-CM | POA: Diagnosis not present

## 2017-08-04 DIAGNOSIS — R1084 Generalized abdominal pain: Secondary | ICD-10-CM | POA: Diagnosis not present

## 2017-08-04 DIAGNOSIS — R197 Diarrhea, unspecified: Secondary | ICD-10-CM

## 2017-08-04 DIAGNOSIS — N3000 Acute cystitis without hematuria: Secondary | ICD-10-CM | POA: Diagnosis not present

## 2017-08-04 DIAGNOSIS — R300 Dysuria: Secondary | ICD-10-CM

## 2017-08-04 LAB — MICROSCOPIC EXAMINATION
Epithelial Cells (non renal): >10 /hpf — AB (ref 0–10)
Renal Epithel, UA: NONE SEEN /hpf

## 2017-08-04 LAB — URINALYSIS, COMPLETE
BILIRUBIN UA: NEGATIVE
Glucose, UA: NEGATIVE
KETONES UA: NEGATIVE
Leukocytes, UA: NEGATIVE
Nitrite, UA: NEGATIVE
PH UA: 6.5 (ref 5.0–7.5)
SPEC GRAV UA: 1.02 (ref 1.005–1.030)
UUROB: 0.2 mg/dL (ref 0.2–1.0)

## 2017-08-04 MED ORDER — CIPROFLOXACIN HCL 500 MG PO TABS: 500.0000 mg | ORAL_TABLET | Freq: Two times a day (BID) | ORAL | 0 refills | Status: DC

## 2017-08-04 NOTE — Progress Notes (Signed)
Patient ID: Kaitlyn Serrano, female    DOB: 12-20-1940, 77 y.o.   MRN: [id number]     Chief Complaint  Patient presents with  . Abdominal Pain   Pt presents to the office today with abdominal pain. PT states she had constipation without a bowel movement for at least week. States she took OTC medication and now is having diarrhea.  Abdominal Pain  This is a new problem. The current episode started 1 to 4 weeks ago. The onset quality is gradual. The problem occurs intermittently. The pain is located in the generalized abdominal region. The pain is at a severity of 9/10. The pain is moderate. The quality of the pain is cramping. The abdominal pain does not radiate. Associated symptoms include belching, constipation, diarrhea, dysuria and nausea. Pertinent negatives include no frequency, hematuria or vomiting.      Review of Systems  Gastrointestinal: Positive for abdominal pain, constipation, diarrhea and nausea. Negative for vomiting.  Genitourinary: Positive for dysuria. Negative for frequency and hematuria.  All other systems reviewed and are negative.      Objective:   Physical Exam  Constitutional: She is oriented to person, place, and time. She appears well-developed and well-nourished. No distress.  HENT:  Head: Normocephalic and atraumatic.  Right Ear: External ear normal.  Mouth/Throat: Oropharynx is clear and moist.  Eyes: Pupils are equal, round, and reactive to light.  Neck: Normal range of motion. Neck supple. No thyromegaly present.  Cardiovascular: Normal rate, regular rhythm, normal heart sounds and intact distal pulses.  No murmur heard. Pulmonary/Chest: Effort normal and breath sounds normal. No respiratory distress. She has no wheezes.  Abdominal: Soft. She exhibits no distension. Bowel sounds are increased. There is tenderness.  Musculoskeletal: Normal range of motion. She exhibits no edema or tenderness.  Neurological: She is alert and oriented to person,  place, and time. She has normal reflexes. No cranial nerve deficit.  Skin: Skin is warm and dry.  Psychiatric: She has a normal mood and affect. Her behavior is normal. Judgment and thought content normal.  Vitals reviewed.    BP (!) 179/87   Pulse 74   Temp (!) 97 F (36.1 C) (Oral)   Ht _0  (1.575 m)   Wt 149 lb 6.4 oz (67.8 kg)   BMI 27.33 kg/m      Assessment & Plan:  Kaitlyn Serrano comes in today with chief complaint of Abdominal Pain   Diagnosis and orders addressed:  1. Generalized abdominal pain I believe this is related to her diarrhea that is caused by the OTC medications she took I do not want her to take any Imodium because this may cause her to become constipated again Force fluids Bland diet - DG Abd 1 View; Future - BMP8+EGFR - CBC with Differential/Platelet  2. Diarrhea, unspecified type - DG Abd 1 View; Future - BMP8+EGFR - CBC with Differential/Platelet  3. Dysuria - Urinalysis, Complete - DG Abd 1 View; Future - BMP8+EGFR - CBC with Differential/Platelet  4. Essential hypertension Pt states she has  Not taken any of her medications at this morning, but will as soon as she gets home   5. Acute cystitis without hematuria Will treat as UTI given symptoms Urine culture pending - ciprofloxacin (CIPRO) 500 MG tablet; Take 1 tablet (500 mg total) by mouth 2 (two) times daily.  Dispense: 14 tablet; Refill: 0 - Microscopic Examination    Evelina Dun, FNP

## 2017-08-05 LAB — BMP8+EGFR
BUN / CREAT RATIO: 15 (ref 12–28)
BUN: 9 mg/dL (ref 8–27)
CO2: 23 mmol/L (ref 20–29)
Calcium: 9.6 mg/dL (ref 8.7–10.3)
Chloride: 103 mmol/L (ref 96–106)
Creatinine, Ser: 0.62 mg/dL (ref 0.57–1.00)
GFR calc Af Amer: 101 mL/min/{1.73_m2} (ref >59)
GFR, EST NON AFRICAN AMERICAN: 87 mL/min/{1.73_m2} (ref >59)
GLUCOSE: 84 mg/dL (ref 65–99)
Potassium: 4.9 mmol/L (ref 3.5–5.2)
Sodium: 142 mmol/L (ref 134–144)

## 2017-08-05 LAB — CBC WITH DIFFERENTIAL/PLATELET
BASOS: 1 %
Basophils Absolute: 0 10*3/uL (ref 0.0–0.2)
EOS (ABSOLUTE): 0.4 10*3/uL (ref 0.0–0.4)
EOS: 5 %
HEMATOCRIT: 43.1 % (ref 34.0–46.6)
HEMOGLOBIN: 14.3 g/dL (ref 11.1–15.9)
IMMATURE GRANULOCYTES: 0 %
Immature Grans (Abs): 0 10*3/uL (ref 0.0–0.1)
Lymphocytes Absolute: 1.3 10*3/uL (ref 0.7–3.1)
Lymphs: 19 %
MCH: 29.5 pg (ref 26.6–33.0)
MCHC: 33.2 g/dL (ref 31.5–35.7)
MCV: 89 fL (ref 79–97)
MONOCYTES: 12 %
Monocytes Absolute: 0.8 10*3/uL (ref 0.1–0.9)
NEUTROS PCT: 63 %
Neutrophils Absolute: 4.5 10*3/uL (ref 1.4–7.0)
Platelets: 264 10*3/uL (ref 150–450)
RBC: 4.84 x10E6/uL (ref 3.77–5.28)
RDW: 13.6 % (ref 12.3–15.4)
WBC: 7 10*3/uL (ref 3.4–10.8)

## 2017-08-12 ENCOUNTER — Telehealth: Payer: Self-pay | Admitting: Family

## 2017-08-12 NOTE — Telephone Encounter (Signed)
lmtcb

## 2017-08-17 ENCOUNTER — Other Ambulatory Visit: Payer: Self-pay

## 2017-08-17 ENCOUNTER — Emergency Department (HOSPITAL_COMMUNITY): Payer: Medicare Other

## 2017-08-17 ENCOUNTER — Encounter (HOSPITAL_COMMUNITY): Payer: Self-pay | Admitting: Emergency Medicine

## 2017-08-17 ENCOUNTER — Emergency Department (HOSPITAL_COMMUNITY)
Admission: EM | Admit: 2017-08-17 | Discharge: 2017-08-17 | Disposition: A | Discharge status: Home / Self Care | Payer: Medicare Other | Attending: Emergency Medicine | Admitting: Emergency Medicine

## 2017-08-17 DIAGNOSIS — S79921A Unspecified injury of right thigh, initial encounter: Secondary | ICD-10-CM | POA: Diagnosis present

## 2017-08-17 DIAGNOSIS — Z87891 Personal history of nicotine dependence: Secondary | ICD-10-CM | POA: Insufficient documentation

## 2017-08-17 DIAGNOSIS — W19XXXA Unspecified fall, initial encounter: Secondary | ICD-10-CM

## 2017-08-17 DIAGNOSIS — J45909 Unspecified asthma, uncomplicated: Secondary | ICD-10-CM | POA: Diagnosis not present

## 2017-08-17 DIAGNOSIS — Y929 Unspecified place or not applicable: Secondary | ICD-10-CM | POA: Insufficient documentation

## 2017-08-17 DIAGNOSIS — Z79899 Other long term (current) drug therapy: Secondary | ICD-10-CM | POA: Diagnosis not present

## 2017-08-17 DIAGNOSIS — I509 Heart failure, unspecified: Secondary | ICD-10-CM | POA: Insufficient documentation

## 2017-08-17 DIAGNOSIS — Y999 Unspecified external cause status: Secondary | ICD-10-CM | POA: Insufficient documentation

## 2017-08-17 DIAGNOSIS — I2510 Atherosclerotic heart disease of native coronary artery without angina pectoris: Secondary | ICD-10-CM | POA: Insufficient documentation

## 2017-08-17 DIAGNOSIS — S7011XA Contusion of right thigh, initial encounter: Secondary | ICD-10-CM | POA: Insufficient documentation

## 2017-08-17 DIAGNOSIS — I110 Hypertensive heart disease with heart failure: Secondary | ICD-10-CM | POA: Insufficient documentation

## 2017-08-17 DIAGNOSIS — Y9301 Activity, walking, marching and hiking: Secondary | ICD-10-CM | POA: Diagnosis not present

## 2017-08-17 DIAGNOSIS — W010XXA Fall on same level from slipping, tripping and stumbling without subsequent striking against object, initial encounter: Secondary | ICD-10-CM | POA: Insufficient documentation

## 2017-08-17 DIAGNOSIS — R51 Headache: Secondary | ICD-10-CM | POA: Diagnosis not present

## 2017-08-17 DIAGNOSIS — E119 Type 2 diabetes mellitus without complications: Secondary | ICD-10-CM | POA: Diagnosis not present

## 2017-08-17 MED ORDER — ONDANSETRON HCL 4 MG/2ML IJ SOLN
4.0000 mg | Freq: Once | INTRAMUSCULAR | Status: AC
  Administered 2017-08-17: 4 mg via INTRAVENOUS
  Filled 2017-08-17: qty 2

## 2017-08-17 MED ORDER — MORPHINE SULFATE (PF) 4 MG/ML IV SOLN
4.0000 mg | Freq: Once | INTRAVENOUS | Status: AC
  Administered 2017-08-17: 4 mg via INTRAVENOUS
  Filled 2017-08-17: qty 1

## 2017-08-17 MED ORDER — NAPROXEN 375 MG PO TABS: 375.0000 mg | ORAL_TABLET | Freq: Two times a day (BID) | ORAL | 0 refills | Status: AC

## 2017-08-17 NOTE — Discharge Instructions (Addendum)
Please call to schedule an appointment with your primary care provider tomorrow in regards to your visit today and for possible work-up, possible ultrasound, of your contusion to your right thigh. Return to the emergency department for any new or worsening symptoms.  Contact a doctor if: Your symptoms do not get better after several days of treatment. Your symptoms get worse. You have trouble moving the injured area. Get help right away if: You have very bad pain. You have a loss of feeling (numbness) in a hand or foot. Your hand or foot turns pale or cold.  Contact a health care provider if: You miss a dose of your blood thinner. You have nausea, vomiting, or diarrhea that lasts for more than one day. Your menstrual period is heavier than usual. You have unusual bruising. Get help right away if: You have new or increased pain, swelling, or redness in an arm or leg. You have numbness or tingling in an arm or leg. You have shortness of breath. You have chest pain. You have a rapid or irregular heartbeat. You feel light-headed or dizzy. You cough up blood. There is blood in your vomit, stool, or urine. You have a serious fall or accident, or you hit your head. You have a severe headache or confusion. You have a cut that will not stop bleeding.

## 2017-08-17 NOTE — ED Provider Notes (Signed)
[facility] EMERGENCY DEPARTMENT Provider Note   CSN: [id number] Arrival date & time: 08/17/17  1435     History   Chief Complaint Chief Complaint  Patient presents with  . Fall    HPI Kaitlyn Serrano is a 77 y.o. female  Presenting after a mechanical fall last night. Patient states that she tripped forward over a plastic box and caught herself on her hands. Patient denies hitting her head or LOC. Patient states that after her fall she went to bed and woke up with pain this morning. Patient's primary complaint is Right inner thigh pain. Patient states she has a large bruise to the area and the pain is a 9/10 throbbing pain. Patient took tylenol for her pain with some relief. Patient also endorses neck and upper back pain. Patient describes pain as a pressure made worse with movement. Patient states that tylenol did not help this pain. Patient also endorses mild throbbing headache. Patient states headache is unchanged, not made worse by light, sound or movement. Patient denies use of blood thinners. Patient is ambulatory in department today.  The history is provided by the patient.    Past Medical History:  Diagnosis Date  . Asthma   . Blood transfusion   . Blurred vision   . CAD (coronary artery disease)   . Chest pain   . CHF (congestive heart failure) ([place])   . Chronic pain syndrome   . Cough   . Cyanosis   . Diabetes ([place])   . Diverticulosis   . Double vision   . 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 Dysphagia   . Dyspnea   . Exposure to TB   . Facial pain   . Generalized weakness   . GERD (gastroesophageal reflux disease)   . GERD (gastroesophageal reflux disease)   . Glaucoma   . Hearing loss   . Heart murmur   . Hemorrhoids   . Hemorrhoids   . High cholesterol   . Hoarseness   . Hx of cardiovascular stress test    negative 2012 and 2016  . Hypertension   . IBS (irritable bowel syndrome)   . Insomnia   . Intervertebral disc disorder with myelopathy, lumbar  region   . Irregular heart beat   . Leg swelling   . Lumbago   . Lumbar spine pain   . MI (myocardial infarction) ([place])   . Nasal congestion   . Night sweats   . Palpitations   . Rectal bleeding   . Renal insufficiency   . Ringing in ears   . Sinus infection   . Spondylosis with myelopathy, lumbar region   . Syncope   . Valvular heart disease   . Vertigo   . Vision loss   . Visual disorder   . Wheezing     Patient Active Problem List   Diagnosis Date Noted  . Prediabetes 02/12/2017  . 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 Constipation 01/16/2015  . Insomnia 01/16/2015  . GAD (generalized anxiety disorder) 10/08/2013  . Vitamin D deficiency 10/08/2013  . GERD (gastroesophageal reflux disease) 10/08/2013  . Hypertension 08/13/2012  . Hyperlipemia 08/13/2012  . Seasonal allergic rhinitis 06/30/2012  . Hemorrhoids 06/25/2011    Past Surgical History:  Procedure Laterality Date  . ABDOMINAL HYSTERECTOMY    . BACK SURGERY    . BONE MARROW ASPIRATION    . CERVICAL SPINE SURGERY    . FIXATION KYPHOPLASTY    . LAMINOTOMY    . SPINAL  FUSION       OB History    Gravida  4   Para  3   Term  3   Preterm  0   AB  1   Living        SAB  0   TAB  0   Ectopic  1   Multiple      Live Births               Home Medications    Prior to Admission medications   Medication Sig Start Date End Date Taking? Authorizing Provider  ALPRAZolam Duanne Moron) 0.5 MG tablet TAKE ONE-HALF TABLET BY MOUTH TWICE DAILY AS NEEDED FOR ANXIETY /SLEEP 05/23/17  Yes Hawks, Christy A, FNP  atenolol (TENORMIN) 50 MG tablet Take 1 tablet (50 mg total) by mouth daily. 05/23/17  Yes Hawks, Christy A, FNP  busPIRone (BUSPAR) 7.5 MG tablet TAKE  (1)  TABLET  THREE TIMES DAILY. 05/23/17  Yes Hawks, Christy A, FNP  ciprofloxacin (CIPRO) 500 MG tablet Take 1 tablet (500 mg total) by mouth 2 (two) times daily. 08/04/17  Yes Hawks, Christy A, FNP  cyclobenzaprine (FLEXERIL) 10 MG tablet Take 10 mg by  mouth 3 (three) times daily as needed for muscle spasms.  07/23/17  Yes [provider]  dicyclomine (BENTYL) 10 MG capsule TAKE (1) CAPSULE TWICE DAILY. 06/06/17  Yes Hawks, Christy A, FNP  diltiazem (CARTIA XT) 120 MG 24 hr capsule Take 1 capsule (120 mg total) by mouth daily. 05/23/17  Yes Hawks, Christy A, FNP  DULoxetine (CYMBALTA) 30 MG capsule TAKE (1) CAPSULE DAILY 05/23/17  Yes Hawks, Christy A, FNP  EPINEPHrine 0.3 mg/0.3 mL IJ SOAJ injection Inject 0.3 mLs (0.3 mg total) into the muscle once. 10/06/13  Yes Lysbeth Penner, FNP  escitalopram (LEXAPRO) 5 MG tablet Take 5 mg by mouth daily.  05/16/17  Yes [provider]  FEMRING 0.05 MG/24HR RING INSERT (1) INTO THE VAGINA AS DIR- ECTED. -FOR VAGINAL USE- Patient taking differently: INSERT (1) INTO THE VAGINA AS DIR- ECTED. -FOR VAGINAL USE- every 3 months 07/02/16  Yes Martin, Mary-Margaret, FNP  fluticasone (FLONASE) 50 MCG/ACT nasal spray Place 2 sprays daily into both nostrils. Patient taking differently: Place 2 sprays into both nostrils daily as needed for allergies.  01/07/17  Yes Gottschalk, Leatrice Jewels M, DO  furosemide (LASIX) 20 MG tablet Take 1 tablet (20 mg total) by mouth daily. Patient taking differently: Take 10-20 mg by mouth daily.  05/23/17  Yes Hawks, Christy A, FNP  gabapentin (NEURONTIN) 300 MG capsule Take 300 mg by mouth daily as needed (for pain).  07/23/17  Yes [provider]  lisinopril (PRINIVIL,ZESTRIL) 10 MG tablet Take 1 tablet (10 mg total) by mouth daily. 05/23/17  Yes Hawks, [name] A, FNP  loratadine (CLARITIN) 10 MG tablet Take 1 tablet (10 mg total) daily by mouth. 01/07/17  Yes Gottschalk, Ashly M, DO  polyethylene glycol powder (GLYCOLAX/MIRALAX) powder Take 17 g by mouth 2 (two) times daily as needed. 06/28/16  Yes Evelina Dun A, FNP  traZODone (DESYREL) 50 MG tablet TAKE ONE TABLET AT BEDTIME 06/18/17  Yes Hawks, Christy A, FNP  doxycycline (VIBRA-TABS) 100 MG tablet Take 1 tablet (100  mg total) by mouth 2 (two) times daily. Patient not taking: Reported on 08/17/2017 07/15/17   Janora Norlander, DO  HYDROcodone-acetaminophen [facility]) 10-325 MG tablet  07/25/17   [provider]  naproxen (NAPROSYN) 375 MG tablet Take 1 tablet (375 mg  total) by mouth 2 (two) times daily for 7 days. 08/17/17 08/24/17  Deliah Boston, PA-C    Family History Family History  Problem Relation Age of Onset  . Diabetes Mother   . Heart disease Mother   . Hypertension Mother   . Gallbladder disease Mother   . Heart disease Father   . Pneumonia Sister   . Hypertension Brother   . Hyperlipidemia Brother   . Gallbladder disease Unknown   . Hypertension Child     Social History Social History   Tobacco Use  . Smoking status: Former Smoker    Packs/day: 4.00    Years: 3.00    Pack years: 12.00    Types: Cigarettes    Start date: 06/10/1960    Last attempt to quit: 02/20/1964    Years since quitting: 53.5  . Smokeless tobacco: Never Used  Substance Use Topics  . Alcohol use: No    Alcohol/week: 0.0 oz  . Drug use: No     Allergies   Bee venom; Amlodipine; Elavil [amitriptyline hcl]; Statins; and Penicillins   Review of Systems Review of Systems  Constitutional: Negative.  Negative for chills, fatigue and fever.  HENT: Negative.  Negative for congestion, ear pain, rhinorrhea, sore throat and trouble swallowing.   Eyes: Negative.  Negative for visual disturbance.  Respiratory: Negative.  Negative for cough, chest tightness and shortness of breath.   Cardiovascular: Negative.  Negative for chest pain and leg swelling.  Gastrointestinal: Negative.  Negative for abdominal pain, blood in stool, diarrhea, nausea and vomiting.  Genitourinary: Negative.  Negative for dysuria, flank pain and hematuria.  Musculoskeletal: Positive for back pain and neck stiffness. Negative for arthralgias and myalgias.       Right thigh pain and bruising  Skin: Negative.  Negative for rash.    Neurological: Positive for headaches. Negative for dizziness, syncope, speech difficulty, weakness and light-headedness.     Physical Exam Updated Vital Signs BP (!) 148/66 (BP Location: Right Arm)   Pulse 72   Temp 98.1 F (36.7 C) (Oral)   Resp 18   Ht 5\' 2"  (1.575 m)   Wt 67.6 kg (149 lb)   SpO2 96%   BMI 27.25 kg/m   Physical Exam  Constitutional: She is oriented to person, place, and time. She appears well-developed and well-nourished. No distress.  HENT:  Head: Normocephalic and atraumatic. Head is without raccoon's eyes, without Battle's sign, without abrasion and without contusion.  Right Ear: Tympanic membrane normal. No hemotympanum.  Left Ear: Tympanic membrane normal. No hemotympanum.  Mouth/Throat: Uvula is midline, oropharynx is clear and moist and mucous membranes are normal.  Eyes: Pupils are equal, round, and reactive to light.  Neck: Normal range of motion. Neck supple.  Cardiovascular: Normal rate, regular rhythm, normal heart sounds and intact distal pulses.  Pulmonary/Chest: Effort normal and breath sounds normal. No respiratory distress.  Abdominal: Soft. Normal appearance and bowel sounds are normal. There is no tenderness.  No signs of trauma/ bruising.  Musculoskeletal: She exhibits tenderness.       Cervical back: She exhibits bony tenderness. She exhibits normal range of motion, no swelling and no deformity.       Thoracic back: She exhibits tenderness. She exhibits no bony tenderness, no swelling, no edema and no deformity.       Right upper leg: She exhibits tenderness. She exhibits no bony tenderness.       Legs: Neurological: She is alert and oriented to person, place, and  time. She has normal strength. No cranial nerve deficit or sensory deficit. She displays a negative Romberg sign.  Skin: Skin is warm and dry.  Psychiatric: She has a normal mood and affect. Her behavior is normal.    ED Treatments / Results  Labs (all labs ordered are  listed, but only abnormal results are displayed) Labs Reviewed - No data to display  EKG None  Radiology Dg Thoracic Spine 2 View  Result Date: 08/17/2017 CLINICAL DATA:  [place] today.  Pain and swelling. EXAM: THORACIC SPINE 2 VIEWS COMPARISON:  CT thoracic spine 08/12/2016 FINDINGS: Normal alignment of the thoracic vertebral bodies. The thoracolumbar fusion hardware appears intact. Stable advanced degenerate disc disease noted at T9-10. No obvious paravertebral soft tissue swelling. The visualized lungs are intact. No obvious posterior rib fractures visualized. IMPRESSION: Normal alignment and no acute bony findings. Electronically Signed   By: Marijo Sanes M.D.   On: 08/17/2017 17:02   Dg Ankle Complete Right  Result Date: 08/17/2017 CLINICAL DATA:  [place].  Right ankle pain. EXAM: RIGHT ANKLE - COMPLETE 3+ VIEW COMPARISON:  None. FINDINGS: The ankle mortise is maintained. Minimal degenerative changes. No ankle or midfoot/hindfoot fractures are identified. No ankle joint effusion. No osteochondral abnormality. IMPRESSION: No acute fracture. Electronically Signed   By: Marijo Sanes M.D.   On: 08/17/2017 17:04   Ct Head Wo Contrast  Result Date: 08/17/2017 CLINICAL DATA:  77 year old female with history of trauma from a fall yesterday. No trauma to the head. EXAM: CT HEAD WITHOUT CONTRAST CT CERVICAL SPINE WITHOUT CONTRAST TECHNIQUE: Multidetector CT imaging of the head and cervical spine was performed following the standard protocol without intravenous contrast. Multiplanar CT image reconstructions of the cervical spine were also generated. COMPARISON:  Head CT and cervical spine CT 04/18/2017. FINDINGS: CT HEAD FINDINGS Brain: Patchy and confluent areas of decreased attenuation are noted throughout the deep and periventricular white matter of the cerebral hemispheres bilaterally, compatible with chronic microvascular ischemic disease. Physiologic calcifications are noted in the basal ganglia  bilaterally. No evidence of acute infarction, hemorrhage, hydrocephalus, extra-axial collection or mass lesion/mass effect. Vascular: No hyperdense vessel or unexpected calcification. Skull: Normal. Negative for fracture or focal lesion. Sinuses/Orbits: No acute finding. Other: None. CT CERVICAL SPINE FINDINGS Alignment: Normal. Skull base and vertebrae: Small sclerotic lesion in the superior aspect of T1 vertebral body with narrow zone of transition, compatible with small bone island. Status post ACDF from C3-C7 with interbody grafts at C3-C4, C4-C5, C5-C6 and C6-C7, as well as bilateral posterior rod and pedicle screw fixation device placed from C3-C7. No acute fracture. No primary bone lesion or focal pathologic process. Soft tissues and spinal canal: No prevertebral fluid or swelling. No visible canal hematoma. Disc levels:  Status post ACDF at C3-C7, as above. Upper chest: Unremarkable. Other: None. IMPRESSION: 1. No acute intracranial abnormalities. 2. Chronic microvascular ischemic changes throughout the cerebral white matter, as above. 3. No acute abnormality of the cervical spine. 4. Postoperative changes in the cervical spine, as above. Electronically Signed   By: Vinnie Langton M.D.   On: 08/17/2017 16:44   Ct Cervical Spine Wo Contrast  Result Date: 08/17/2017 CLINICAL DATA:  77 year old female with history of trauma from a fall yesterday. No trauma to the head. EXAM: CT HEAD WITHOUT CONTRAST CT CERVICAL SPINE WITHOUT CONTRAST TECHNIQUE: Multidetector CT imaging of the head and cervical spine was performed following the standard protocol without intravenous contrast. Multiplanar CT image reconstructions of the cervical spine were also generated.  COMPARISON:  Head CT and cervical spine CT 04/18/2017. FINDINGS: CT HEAD FINDINGS Brain: Patchy and confluent areas of decreased attenuation are noted throughout the deep and periventricular white matter of the cerebral hemispheres bilaterally, compatible  with chronic microvascular ischemic disease. Physiologic calcifications are noted in the basal ganglia bilaterally. No evidence of acute infarction, hemorrhage, hydrocephalus, extra-axial collection or mass lesion/mass effect. Vascular: No hyperdense vessel or unexpected calcification. Skull: Normal. Negative for fracture or focal lesion. Sinuses/Orbits: No acute finding. Other: None. CT CERVICAL SPINE FINDINGS Alignment: Normal. Skull base and vertebrae: Small sclerotic lesion in the superior aspect of T1 vertebral body with narrow zone of transition, compatible with small bone island. Status post ACDF from C3-C7 with interbody grafts at C3-C4, C4-C5, C5-C6 and C6-C7, as well as bilateral posterior rod and pedicle screw fixation device placed from C3-C7. No acute fracture. No primary bone lesion or focal pathologic process. Soft tissues and spinal canal: No prevertebral fluid or swelling. No visible canal hematoma. Disc levels:  Status post ACDF at C3-C7, as above. Upper chest: Unremarkable. Other: None. IMPRESSION: 1. No acute intracranial abnormalities. 2. Chronic microvascular ischemic changes throughout the cerebral white matter, as above. 3. No acute abnormality of the cervical spine. 4. Postoperative changes in the cervical spine, as above. Electronically Signed   By: Vinnie Langton M.D.   On: 08/17/2017 16:44   Dg Femur Min 2 Views Right  Result Date: 08/17/2017 CLINICAL DATA:  [place].  Right leg pain. EXAM: RIGHT FEMUR 2 VIEWS COMPARISON:  None. FINDINGS: Moderate to advanced right hip joint degenerative changes and mild-to-moderate right knee degenerative changes. No acute fracture of the hip, visualized right hemipelvis or femur. IMPRESSION: No acute bony findings. Electronically Signed   By: Marijo Sanes M.D.   On: 08/17/2017 17:03    Procedures Procedures (including critical care time)  Medications Ordered in ED Medications  morphine 4 MG/ML injection 4 mg (4 mg Intravenous Given 08/17/17  1554)  ondansetron (ZOFRAN) injection 4 mg (4 mg Intravenous Given 08/17/17 1554)     Initial Impression / Assessment and Plan / ED Course  I have reviewed the triage vital signs and the nursing notes.  Pertinent labs & imaging results that were available during my care of the patient were reviewed by me and considered in my medical decision making (see chart for details).  Clinical Course as of Aug 17 1728  Sun Aug 17, 2017  1715 I personally looked at the femur x-ray, there is no signs of fracture or dislocation of this bone.  The patient is informed of all of her results.   [BM]    Clinical Course User Index [BM] Noemi Chapel, MD   Patient presenting after fall last night. Patient without signs of serious head, neck, back injuries.  Normal neurological exam.  No signs of lung or intra-abdominal injuries.  Back and neck pain appears to be muscular soreness after fall last night.  Imaging negative for fractures.  Imaging negative for intracranial hemorrhage.  Patient ambulatory in the emergency department.  Patient's contusion to her inner right thigh appears to be primarily superficial ecchymosis at this time, no signs of blood clot, no lower extremity edema, strong pedal pulses bilaterally.  Patient able to move her right leg without decrease in strength or sensation.  Patient has been instructed to follow-up with her primary care provider tomorrow for possible work-up and ultrasound of the area.  Patient informed to use anti-inflammatories and warm compresses on area and elevation.  Patient  educated on signs and symptoms of deep vein thromboses and PEs.  Given extensive return precautions and states understanding return precautions.  Patient states that she will follow-up with her primary care provider in the morning.  Patient states understanding of care plan and is agreeable for discharge.  Patient given fall prevention packet in after visit summary and she states that she feels safe for  discharge.  Patient prescribed naproxen for muscle soreness.   Final Clinical Impressions(s) / ED Diagnoses   Final diagnoses:  Fall, initial encounter  Contusion of right thigh, initial encounter    ED Discharge Orders        Ordered    naproxen (NAPROSYN) 375 MG tablet  2 times daily     08/17/17 1729       Gari Crown 08/17/17 2206    Noemi Chapel, MD 08/17/17 2354

## 2017-08-17 NOTE — ED Provider Notes (Signed)
Medical screening examination/treatment/procedure(s) were conducted as a shared visit with non-physician practitioner(s) and myself.  I personally evaluated the patient during the encounter.  Clinical Impression:   Final diagnoses:  Fall, initial encounter  Contusion of right thigh, initial encounter   Patient is a 77 year old female, very pleasant, not on any blood thinners, presents within 24 hours after tripping forward over a plastic storage bin.  She struck her right inner thigh on something on the way down and has suffered some bruising in that area.  On my exam the patient is able to straight leg raise, she is able to flex and extend at the knee without difficulty, she is able to abduct the legs together without any difficulty, all of these things cause mild pain.  There is a large area of ecchymosis on the medial proximal thigh, she has normal pulses at the feet, there is no edema of the leg.  There is no palpable cords.  The patient will need imaging of the femur, she may have some underlying vascular injury and will need close follow-up with a possible ultrasound if there is any swelling of the leg.  At this time ice, elevation, anti-inflammatories.  Patient agreeable.     Noemi Chapel, MD 08/17/17 [phone]

## 2017-08-17 NOTE — ED Triage Notes (Signed)
Patient c/o bilateral hip pain, right leg pain, neck pain, and upper back pain after falling yesterday. Patient states she tripped over plastic storage. Denies hitting head, LOC, or dizziness. Large bruise with abrasion to right inner thigh. No obvious deformity. Patient denies taking any blood thinners. Patient ambulatory.

## 2017-08-18 NOTE — Telephone Encounter (Signed)
Tried to contact patient again and received her voicemail.  Will file this encounter and if she still needs anything, she will call back.

## 2017-08-20 ENCOUNTER — Encounter: Payer: Self-pay | Admitting: Family Medicine

## 2017-08-20 ENCOUNTER — Ambulatory Visit (INDEPENDENT_AMBULATORY_CARE_PROVIDER_SITE_OTHER): Payer: Medicare Other | Admitting: Family Medicine

## 2017-08-20 VITALS — BP 142/88 | HR 60 | Temp 97.6°F | Ht 62.0 in | Wt 152.0 lb

## 2017-08-20 DIAGNOSIS — M79651 Pain in right thigh: Secondary | ICD-10-CM

## 2017-08-20 DIAGNOSIS — I10 Essential (primary) hypertension: Secondary | ICD-10-CM

## 2017-08-20 NOTE — Patient Instructions (Addendum)
Follow up with your cardiologist as directed.  I think that the area of concern is a hematoma.  I have ordered an ultrasound to look at it closer.  Hematoma A hematoma is a collection of blood under the skin, in an organ, in a body space, in a joint space, or in other tissue. The blood can thicken (clot) to form a lump that you can see and feel. The lump is often firm and may become sore and tender. Most hematomas get better in a few days to weeks. However, some hematomas may be serious and require medical care. Hematomas can range from very small to very large. What are the causes? This condition is caused by:  A blunt or penetrating injury.  A leakage from a blood vessel under the skin. This leak happens on its own (is spontaneous) and is more likely to occur in older people, especially those who take blood thinners.  Some medical procedures including surgeries, such as oral surgery, face lifts, and surgeries that involve the joints.  Some medical conditions that cause bleeding or bruising problems. There may be multiple hematomas that appear in different areas of the body.  What are the signs or symptoms? Symptoms of this condition can depend on where the hematoma is located. Common symptoms of a hematoma under the skin include:  A firm lump on the body.  Pain and tenderness in the area.  Bruising. Blue, dark blue, purple-red, or yellowish skin (discoloration) may appear at the site of the hematoma if the hematoma is close to the surface of the skin.  For hematomas in deeper tissues or body spaces, symptoms may be less obvious. A collection of blood in the stomach (intra-abdominal hematoma) may cause pain in the abdomen, weakness, fainting, and shortness of breath. A collection of blood in the head (intracranial hematoma) may cause a headache or symptoms such as weakness, trouble speaking or understanding, or a change in consciousness. How is this diagnosed? This condition is  diagnosed based on:  Your medical history.  A physical exam.  Imaging tests, such as an ultrasonogram or CT scan. These may be needed if your health care provider suspects a hematoma in deeper tissues or body spaces.  Blood tests. These may be needed if your health care provider believes that the hematoma is caused by a medical condition.  How is this treated? This condition usually does not need treatment because many hematomas go away on their own over time. However, large hematomas, or those that may affect vital organs, may need surgical drainage or monitoring. If the hematoma is caused by a medical condition, medicines may be prescribed. Follow these instructions at home: Managing pain, stiffness, and swelling  If directed, apply ice to the affected area: ? Put ice in a plastic bag. ? Place a towel between your skin and the bag. ? Leave the ice on for 20 minutes, 2-3 times a day for the first couple of days.  After applying ice for a couple of days, your health care provider may recommend that you apply warm compresses to the affected area instead. Do this as told by your health care provider. Remove the heat if your skin turns bright red. This is especially important if you are unable to feel pain, heat, or cold. You may have a greater risk of getting burned  Raise (elevate) the affected area above the level of your heart while you are sitting or lying down.  Wrap the affected area with an elastic  bandage, if told by your health care provider. The bandage applies pressure (compression) to the area, which may help to reduce swelling and help the hematoma heal. Make sure the bandage is not wrapped too tight.  If your hematoma is on a leg or foot (lower extremity) and is painful, your health care provider may recommend crutches. Use them as told by your health care provider. General instructions  Take over-the-counter and prescription medicines only as told by your health care  provider.  Keep all follow-up visits as told by your health care provider. This is important. Contact a health care provider if:  You have a fever.  The swelling or discoloration gets worse.  You develop more hematomas. Get help right away if:  Your pain is worse or your pain is not controlled with medicine.  Your skin over the hematoma breaks or starts bleeding.  Your hematoma is in your chest or abdomen and you have weakness, shortness of breath, or a change in consciousness.  You have a hematoma on your scalp caused by a fall or injury and you have a headache that gets worse, trouble speaking or understanding, weakness, or a change in alertness or consciousness. Summary  A hematoma is a collection of blood under the skin, in an organ, in a body space, in a joint space, or in other tissue.  This condition usually does not need treatment because many hematomas go away on their own over time.  Large hematomas, or those that may affect vital organs, may need surgical drainage or monitoring. If the hematoma is caused by a medical condition, medicines may be prescribed. This information is not intended to replace advice given to you by your health care provider. Make sure you discuss any questions you have with your health care provider. Document Released: 09/26/2003 Document Revised: 03/16/2016 Document Reviewed: 03/16/2016 Elsevier Interactive Patient Education  2018 [employer].

## 2017-08-20 NOTE — Progress Notes (Signed)
Subjective: CC: knot on back of leg PCP: Sharion Balloon, FNP Kaitlyn Serrano is a 77 y.o. female presenting to clinic today for:  1. Knot on leg Patient reports that she sustained a mechanical fall about 3 days ago.  She was evaluated in the emergency department who obtained imaging studies of her right lower extremity, neck, thoracic spine and head.  These were all unremarkable.  She comes in today complaining of worsening right inner thigh pain.  She reports a palpable swelling in this area with substantial bruising.  She is worried that she may have developed a clot.  She has been using her medications as directed.  ROS: Per HPI  Allergies  Allergen Reactions  . Bee Venom Anaphylaxis  . Amlodipine Swelling  . Elavil [Amitriptyline Hcl] Other (See Comments)    confusion  . Statins Swelling and Other (See Comments)    Peeling of skin  . Penicillins Rash    Has patient had a PCN reaction causing immediate rash, facial/tongue/throat swelling, SOB or lightheadedness with hypotension: Yes Has patient had a PCN reaction causing severe rash involving mucus membranes or skin necrosis: Yes Has patient had a PCN reaction that required hospitalization: Yes Has patient had a PCN reaction occurring within the last 10 years: No If all of the above answers are "NO", then may proceed with Cephalosporin use.    Past Medical History:  Diagnosis Date  . Asthma   . Blood transfusion   . Blurred vision   . CAD (coronary artery disease)   . Chest pain   . CHF (congestive heart failure) ([place])   . Chronic pain syndrome   . Cough   . Cyanosis   . Diabetes ([place])   . Diverticulosis   . Double vision   . 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 Dysphagia   . Dyspnea   . Exposure to TB   . Facial pain   . Generalized weakness   . GERD (gastroesophageal reflux disease)   . GERD (gastroesophageal reflux disease)   . Glaucoma   . Hearing loss   . Heart murmur   . Hemorrhoids   .  Hemorrhoids   . High cholesterol   . Hoarseness   . Hx of cardiovascular stress test    negative 2012 and 2016  . Hypertension   . IBS (irritable bowel syndrome)   . Insomnia   . Intervertebral disc disorder with myelopathy, lumbar region   . Irregular heart beat   . Leg swelling   . Lumbago   . Lumbar spine pain   . MI (myocardial infarction) ([place])   . Nasal congestion   . Night sweats   . Palpitations   . Rectal bleeding   . Renal insufficiency   . Ringing in ears   . Sinus infection   . Spondylosis with myelopathy, lumbar region   . Syncope   . Valvular heart disease   . Vertigo   . Vision loss   . Visual disorder   . Wheezing     Current Outpatient Medications:  .  ALPRAZolam (XANAX) 0.5 MG tablet, TAKE ONE-HALF TABLET BY MOUTH TWICE DAILY AS NEEDED FOR ANXIETY /SLEEP, Disp: 60 tablet, Rfl: 3 .  atenolol (TENORMIN) 50 MG tablet, Take 1 tablet (50 mg total) by mouth daily., Disp: 90 tablet, Rfl: 1 .  busPIRone (BUSPAR) 7.5 MG tablet, TAKE  (1)  TABLET  THREE TIMES DAILY., Disp: 60 tablet, Rfl: 1 .  cyclobenzaprine (FLEXERIL) 10  MG tablet, Take 10 mg by mouth 3 (three) times daily as needed for muscle spasms. , Disp: , Rfl:  .  dicyclomine (BENTYL) 10 MG capsule, TAKE (1) CAPSULE TWICE DAILY., Disp: 60 capsule, Rfl: 0 .  diltiazem (CARTIA XT) 120 MG 24 hr capsule, Take 1 capsule (120 mg total) by mouth daily., Disp: 90 capsule, Rfl: 1 .  doxycycline (VIBRA-TABS) 100 MG tablet, Take 1 tablet (100 mg total) by mouth 2 (two) times daily., Disp: 20 tablet, Rfl: 0 .  DULoxetine (CYMBALTA) 30 MG capsule, TAKE (1) CAPSULE DAILY, Disp: 90 capsule, Rfl: 0 .  EPINEPHrine 0.3 mg/0.3 mL IJ SOAJ injection, Inject 0.3 mLs (0.3 mg total) into the muscle once., Disp: 2 Device, Rfl: 1 .  escitalopram (LEXAPRO) 5 MG tablet, Take 5 mg by mouth daily. , Disp: , Rfl:  .  FEMRING 0.05 MG/24HR RING, INSERT (1) INTO THE VAGINA AS DIR- ECTED. -FOR VAGINAL USE- (Patient taking differently: INSERT  (1) INTO THE VAGINA AS DIR- ECTED. -FOR VAGINAL USE- every 3 months), Disp: 1 each, Rfl: 4 .  fluticasone (FLONASE) 50 MCG/ACT nasal spray, Place 2 sprays daily into both nostrils. (Patient taking differently: Place 2 sprays into both nostrils daily as needed for allergies. ), Disp: 16 g, Rfl: 6 .  furosemide (LASIX) 20 MG tablet, Take 1 tablet (20 mg total) by mouth daily. (Patient taking differently: Take 10-20 mg by mouth daily. ), Disp: 30 tablet, Rfl: 3 .  gabapentin (NEURONTIN) 300 MG capsule, Take 300 mg by mouth daily as needed (for pain). , Disp: , Rfl:  .  HYDROcodone-acetaminophen (NORCO) 10-325 MG tablet, , Disp: , Rfl:  .  lisinopril (PRINIVIL,ZESTRIL) 10 MG tablet, Take 1 tablet (10 mg total) by mouth daily., Disp: 90 tablet, Rfl: 3 .  loratadine (CLARITIN) 10 MG tablet, Take 1 tablet (10 mg total) daily by mouth., Disp: 30 tablet, Rfl: 11 .  naproxen (NAPROSYN) 375 MG tablet, Take 1 tablet (375 mg total) by mouth 2 (two) times daily for 7 days., Disp: 14 tablet, Rfl: 0 .  polyethylene glycol powder (GLYCOLAX/MIRALAX) powder, Take 17 g by mouth 2 (two) times daily as needed., Disp: 3162 g, Rfl: 0 .  traZODone (DESYREL) 50 MG tablet, TAKE ONE TABLET AT BEDTIME, Disp: 90 tablet, Rfl: 0 Social History   Socioeconomic History  . Marital status: Married    Spouse name: Not on file  . Number of children: Not on file  . Years of education: Not on file  . Highest education level: Not on file  Occupational History  . Not on file  Social Needs  . Financial resource strain: Not on file  . Food insecurity:    Worry: Not on file    Inability: Not on file  . Transportation needs:    Medical: Not on file    Non-medical: Not on file  Tobacco Use  . Smoking status: Former Smoker    Packs/day: 4.00    Years: 3.00    Pack years: 12.00    Types: Cigarettes    Start date: 06/10/1960    Last attempt to quit: 02/20/1964    Years since quitting: 53.5  . Smokeless tobacco: Never Used    Substance and Sexual Activity  . Alcohol use: No    Alcohol/week: 0.0 oz  . Drug use: No  . Sexual activity: Yes    Birth control/protection: None  Lifestyle  . Physical activity:    Days per week: Not on file  Minutes per session: Not on file  . Stress: Not on file  Relationships  . Social connections:    Talks on phone: Not on file    Gets together: Not on file    Attends religious service: Not on file    Active member of club or organization: Not on file    Attends meetings of clubs or organizations: Not on file    Relationship status: Not on file  . Intimate partner violence:    Fear of current or ex partner: Not on file    Emotionally abused: Not on file    Physically abused: Not on file    Forced sexual activity: Not on file  Other Topics Concern  . Not on file  Social History Narrative   ** Merged History Encounter **       Family History  Problem Relation Age of Onset  . Diabetes Mother   . Heart disease Mother   . Hypertension Mother   . Gallbladder disease Mother   . Heart disease Father   . Pneumonia Sister   . Hypertension Brother   . Hyperlipidemia Brother   . Gallbladder disease Unknown   . Hypertension Child     Objective: Office vital signs reviewed. BP (!) 142/88   Pulse 60   Temp 97.6 F (36.4 C) (Oral)   Ht 5\' 2"  (1.575 m)   Wt 152 lb (68.9 kg)   BMI 27.80 kg/m   Physical Examination:  General: Awake, alert, well nourished, No acute distress MSK:   Right LE: Patient has a 14 cm x 12 cm area of ecchymosis with central clearing.  There is an area that is approximately 2-1/2 x 2-1/2 inches of induration.  No palpable fluctuance.  There is exquisitely tender to palpation.  She has no swelling of the right calf or tenderness along the deep venous system.  Negative Homans. Neuro: light touch sensation in tact Skin: as above.  Assessment/ Plan: 77 y.o. female   1. Acute pain of right thigh I reviewed her emergency department imaging  studies.  I suspect that area of concern is a hematoma.  Will obtain soft tissue ultrasound to further evaluate.  May need to proceed with venous ultrasound if there is concern for possible DVT or nothing is appreciated on the soft tissue ultrasound.  We will ask her PCP to follow-up on this tomorrow since I will be out of office.  Home care instructions were reviewed with the patient.  Reasons for emergent evaluation the emergency department discussed. - [place] RT LOWER EXTREM LTD SOFT TISSUE NON VASCULAR; Future  2. Elevated blood pressure reading in office with diagnosis of hypertension Repeat blood pressure appropriate for age.  However, given fluctuating blood pressures, may need 24-hour blood pressure monitoring.  She has a cardiologist, who she was supposed to follow-up with.  I did encourage her to go ahead and schedule an appointment to be seen.  May need to augment blood pressure regimen.   Orders Placed This Encounter  Procedures  . [place] RT LOWER EXTREM LTD SOFT TISSUE NON VASCULAR    Standing Status:   Future    Standing Expiration Date:   10/21/2018    Order Specific Question:   Reason for Exam (SYMPTOM  OR DIAGNOSIS REQUIRED)    Answer:   right thigh swelling/ pain after mechanical fall 3 days ago. ?hematoma vs atypical dvt; ok for venous US if needed    Order Specific Question:   Preferred imaging location?  Answer:   Internal    Janora Norlander, Hand [phone]

## 2017-08-21 ENCOUNTER — Ambulatory Visit: Payer: Medicare Other | Admitting: Family

## 2017-08-21 ENCOUNTER — Other Ambulatory Visit: Payer: Self-pay | Admitting: Family

## 2017-08-21 ENCOUNTER — Ambulatory Visit (HOSPITAL_COMMUNITY)
Admission: RE | Admit: 2017-08-21 | Discharge: 2017-08-21 | Disposition: A | Discharge status: Home / Self Care | Payer: Medicare Other | Source: Ambulatory Visit | Attending: Family Medicine | Admitting: Family Medicine

## 2017-08-21 DIAGNOSIS — M79651 Pain in right thigh: Secondary | ICD-10-CM | POA: Insufficient documentation

## 2017-08-21 NOTE — Progress Notes (Signed)
[place] negative for DVT. Most consistent with hematoma.

## 2017-08-21 NOTE — Progress Notes (Signed)
Aware of results. 

## 2017-09-03 ENCOUNTER — Other Ambulatory Visit: Payer: Self-pay | Admitting: Family

## 2017-09-04 ENCOUNTER — Other Ambulatory Visit: Payer: Self-pay | Admitting: Nurse Practitioner

## 2017-09-05 NOTE — Addendum Note (Signed)
Addended by: Antonietta Barcelona D on: 09/05/2017 11:21 AM   Modules accepted: Orders

## 2017-09-09 MED ORDER — ESTRADIOL ACETATE 0.05 MG/24HR VA RING: VAGINAL_RING | VAGINAL | 4 refills | Status: DC

## 2017-09-10 ENCOUNTER — Telehealth: Payer: Self-pay | Admitting: Cardiology

## 2017-09-10 NOTE — Telephone Encounter (Signed)
Numerous attempts to contact patient with recall letters. Unable to reach by telephone. with no success.   : Time: Status:    Kaitlyn Serrano [[id number]] 02/12/2016 1:47 PM New [10]   [System] 04/25/2016 11:05 PM Notification Sent [20]   Kaitlyn Serrano [[id number]] 03/17/2017 8:54 AM Notification Sent [20]   Kaitlyn Serrano [[id number]] 04/01/2017 1:20 PM Scheduled/Linked [30]   [System] 07/13/2017 12:47 AM Notification Sent [20]   Kaitlyn Serrano [[id number]] 09/10/2017 4:22 PM Notification Sent [20

## 2017-09-12 ENCOUNTER — Ambulatory Visit (INDEPENDENT_AMBULATORY_CARE_PROVIDER_SITE_OTHER): Payer: Medicare Other | Admitting: Family

## 2017-09-12 ENCOUNTER — Encounter: Payer: Self-pay | Admitting: Family

## 2017-09-12 VITALS — BP 140/70 | HR 61 | Temp 97.2°F | Ht 62.0 in | Wt 154.0 lb

## 2017-09-12 DIAGNOSIS — S7011XS Contusion of right thigh, sequela: Secondary | ICD-10-CM | POA: Diagnosis not present

## 2017-09-12 DIAGNOSIS — K5900 Constipation, unspecified: Secondary | ICD-10-CM | POA: Diagnosis not present

## 2017-09-12 MED ORDER — POLYETHYLENE GLYCOL 3350 17 GM/SCOOP PO POWD: 17.0000 g | Freq: Two times a day (BID) | ORAL | 0 refills | Status: DC | PRN

## 2017-09-12 NOTE — Patient Instructions (Signed)

## 2017-09-12 NOTE — Progress Notes (Signed)
   Subjective:    Patient ID: Kaitlyn Serrano, female    DOB: 1940-04-04, 77 y.o.   MRN: [id number]  Chief Complaint  Patient presents with  . recheck right leg pain   PT presents to the office today for follow up on hematoma medial right thigh. Pt had [place] on thigh on 08/21/17 that showed 3.2 cm fluid collection on right inner thigh was most consistent with hematoma, but recommended follow up until resolution. PT reports the area continues to be sore and tender, but the pain improves. She states the area has not decreased in size.   Leg Pain   The incident occurred more than 1 week ago. The injury mechanism was a direct blow. The pain is present in the right thigh. The quality of the pain is described as aching. The pain is at a severity of 5/10. The pain is mild. The pain has been improving since onset. She reports no foreign bodies present.      Review of Systems  All other systems reviewed and are negative.      Objective:   Physical Exam  Constitutional: She is oriented to person, place, and time. She appears well-developed and well-nourished. No distress.  HENT:  Head: Normocephalic and atraumatic.  Cardiovascular: Normal rate, regular rhythm, normal heart sounds and intact distal pulses.  No murmur heard. Pulmonary/Chest: Effort normal and breath sounds normal. No respiratory distress. She has no wheezes.  Abdominal: Soft. Bowel sounds are normal. She exhibits no distension. There is no tenderness.  Musculoskeletal: Normal range of motion. She exhibits edema and tenderness.  Tender nodule on medial thigh with slight discoloration of skin  Neurological: She is alert and oriented to person, place, and time. She has normal reflexes. No cranial nerve deficit.  Skin: Skin is warm and dry.  Psychiatric: She has a normal mood and affect. Her behavior is normal. Judgment and thought content normal.  Vitals reviewed.     BP 140/70   Pulse 61   Temp (!) 97.2 F (36.2 C) (Oral)    Ht 5\' 2"  (1.575 m)   Wt 154 lb (69.9 kg)   BMI 28.17 kg/m      Assessment & Plan:  Kaitlyn Serrano was seen today for recheck right leg pain.  Diagnoses and all orders for this visit:  Hematoma of right thigh, sequela  Constipation, unspecified constipation type -     polyethylene glycol powder (GLYCOLAX/MIRALAX) powder; Take 17 g by mouth 2 (two) times daily as needed.   Will follow up in 1 month for chronic follow up and make sure hematoma has resolved If pain increases or has any SOB go to ED Force fluids Start Miralax   Kaitlyn Serrano, [place]

## 2017-10-02 ENCOUNTER — Other Ambulatory Visit: Payer: Self-pay | Admitting: Family

## 2017-10-06 ENCOUNTER — Encounter: Payer: Self-pay | Admitting: Pediatrics

## 2017-10-06 ENCOUNTER — Ambulatory Visit (INDEPENDENT_AMBULATORY_CARE_PROVIDER_SITE_OTHER): Payer: Medicare Other | Admitting: Pediatrics

## 2017-10-06 VITALS — BP 133/77 | HR 66 | Temp 97.2°F | Ht 62.0 in | Wt 150.8 lb

## 2017-10-06 DIAGNOSIS — L03115 Cellulitis of right lower limb: Secondary | ICD-10-CM | POA: Diagnosis not present

## 2017-10-06 DIAGNOSIS — K589 Irritable bowel syndrome without diarrhea: Secondary | ICD-10-CM | POA: Diagnosis not present

## 2017-10-06 MED ORDER — DOXYCYCLINE HYCLATE 100 MG PO TABS: 100.0000 mg | ORAL_TABLET | Freq: Two times a day (BID) | ORAL | 0 refills | Status: DC

## 2017-10-06 MED ORDER — METRONIDAZOLE 500 MG PO TABS: 500.0000 mg | ORAL_TABLET | Freq: Three times a day (TID) | ORAL | 0 refills | Status: AC

## 2017-10-06 MED ORDER — MUPIROCIN 2 % EX OINT: TOPICAL_OINTMENT | CUTANEOUS | 1 refills | Status: DC

## 2017-10-06 MED ORDER — DICYCLOMINE HCL 10 MG PO CAPS: 10.0000 mg | ORAL_CAPSULE | Freq: Two times a day (BID) | ORAL | 3 refills | Status: DC

## 2017-10-06 NOTE — Progress Notes (Signed)
  Subjective:   Patient ID: Kaitlyn Serrano, female    DOB: 1940/11/29, 77 y.o.   MRN: [id number] CC: Wound Infection (right leg)  HPI: Kaitlyn Serrano is a 77 y.o. female   Her dog jumped into her leg, scratching her right lower shin 7 days ago.  She had a skin flap, she is been trying to keep it clean with soap and water.  It was getting better after the first few days, started having more tenderness with walking on the leg so she came in today.  No discharge from the wound, it does open up and bleed slightly off and on.  Stomach symptoms have improved since she has been on the Bentyl.  Would like to continue it.  Asked for refill.  Relevant past medical, surgical, family and social history reviewed. Allergies and medications reviewed and updated. Social History   Tobacco Use  Smoking Status Former Smoker  . Packs/day: 4.00  . Years: 3.00  . Pack years: 12.00  . Types: Cigarettes  . Start date: 06/10/1960  . Last attempt to quit: 02/20/1964  . Years since quitting: 53.6  Smokeless Tobacco Never Used   ROS: Per HPI   Objective:    BP 133/77   Pulse 66   Temp (!) 97.2 F (36.2 C) (Oral)   Ht 5\' 2"  (1.575 m)   Wt 150 lb 12.8 oz (68.4 kg)   BMI 27.58 kg/m   Wt Readings from Last 3 Encounters:  10/06/17 150 lb 12.8 oz (68.4 kg)  09/12/17 154 lb (69.9 kg)  08/20/17 152 lb (68.9 kg)   Gen: NAD, alert, cooperative with exam, NCAT EYES: EOMI, no conjunctival injection, or no icterus CV: NRRR, normal S1/S2, no murmur, distal pulses 2+ b/l Resp: CTABL, no wheezes, normal WOB Abd: +BS, soft, NTND. Ext: No edema b/l ankles, warm Neuro: Alert and oriented, strength equal b/l UE and LE, coordination grossly normal Skin: 1 cm x 1 cm L-shaped skin abrasion, with skin flap.  Healing, base of abrasion dry, red and black. About 1 cm of erythema around all edges of the wound.  No induration, no fluctuance.  Tender to palpation around wound.   Assessment & Plan:  Kaitlyn Serrano was seen  today for wound infection.  Diagnoses and all orders for this visit:  Cellulitis of right lower extremity Cover with below to include anaerobic coverage.  Return precautions discussed.  Any worsening needs to be seen right away. -     metroNIDAZOLE (FLAGYL) 500 MG tablet; Take 1 tablet (500 mg total) by mouth 3 (three) times daily for 10 days. -     doxycycline (VIBRA-TABS) 100 MG tablet; Take 1 tablet (100 mg total) by mouth 2 (two) times daily. -     mupirocin ointment (BACTROBAN) 2 %; Use twice a day on affected areas  Irritable bowel syndrome, unspecified type Stable, continue below. -     dicyclomine (BENTYL) 10 MG capsule; Take 1 capsule (10 mg total) by mouth 2 (two) times daily before a meal.   Follow up plan: Return in about 1 week (around 10/13/2017).  For wound recheck Assunta Found, MD [place]

## 2017-10-17 ENCOUNTER — Ambulatory Visit: Payer: Medicare Other | Admitting: Family

## 2017-11-06 ENCOUNTER — Other Ambulatory Visit: Payer: Self-pay | Admitting: Family

## 2017-11-06 DIAGNOSIS — F411 Generalized anxiety disorder: Secondary | ICD-10-CM

## 2017-12-11 ENCOUNTER — Other Ambulatory Visit: Payer: Self-pay | Admitting: Family

## 2017-12-12 NOTE — Telephone Encounter (Signed)
Last seen 10/06/17  [facility]

## 2017-12-25 ENCOUNTER — Other Ambulatory Visit: Payer: Self-pay | Admitting: Rehabilitation

## 2017-12-25 DIAGNOSIS — M1611 Unilateral primary osteoarthritis, right hip: Secondary | ICD-10-CM

## 2017-12-30 ENCOUNTER — Ambulatory Visit (INDEPENDENT_AMBULATORY_CARE_PROVIDER_SITE_OTHER): Payer: Medicare Other | Admitting: Family

## 2017-12-30 ENCOUNTER — Encounter: Payer: Self-pay | Admitting: Family

## 2017-12-30 VITALS — BP 150/82 | HR 66 | Temp 96.6°F | Ht 62.0 in | Wt 147.8 lb

## 2017-12-30 DIAGNOSIS — F331 Major depressive disorder, recurrent, moderate: Secondary | ICD-10-CM

## 2017-12-30 DIAGNOSIS — K219 Gastro-esophageal reflux disease without esophagitis: Secondary | ICD-10-CM | POA: Diagnosis not present

## 2017-12-30 DIAGNOSIS — Z79899 Other long term (current) drug therapy: Secondary | ICD-10-CM | POA: Insufficient documentation

## 2017-12-30 DIAGNOSIS — K5900 Constipation, unspecified: Secondary | ICD-10-CM

## 2017-12-30 DIAGNOSIS — E559 Vitamin D deficiency, unspecified: Secondary | ICD-10-CM

## 2017-12-30 DIAGNOSIS — G4700 Insomnia, unspecified: Secondary | ICD-10-CM

## 2017-12-30 DIAGNOSIS — F411 Generalized anxiety disorder: Secondary | ICD-10-CM

## 2017-12-30 DIAGNOSIS — E782 Mixed hyperlipidemia: Secondary | ICD-10-CM | POA: Diagnosis not present

## 2017-12-30 DIAGNOSIS — J40 Bronchitis, not specified as acute or chronic: Secondary | ICD-10-CM

## 2017-12-30 DIAGNOSIS — I10 Essential (primary) hypertension: Secondary | ICD-10-CM | POA: Diagnosis not present

## 2017-12-30 DIAGNOSIS — F1320 Sedative, hypnotic or anxiolytic dependence, uncomplicated: Secondary | ICD-10-CM | POA: Insufficient documentation

## 2017-12-30 HISTORY — DX: Sedative, hypnotic or anxiolytic dependence, uncomplicated: F13.20

## 2017-12-30 MED ORDER — ALPRAZOLAM 0.5 MG PO TABS: ORAL_TABLET | ORAL | 5 refills | Status: DC

## 2017-12-30 MED ORDER — PREDNISONE 10 MG (21) PO TBPK: ORAL_TABLET | ORAL | 0 refills | Status: DC

## 2017-12-30 NOTE — Patient Instructions (Signed)

## 2017-12-30 NOTE — Addendum Note (Signed)
Addended by: Evelina Dun A on: 12/30/2017 10:42 AM   Modules accepted: Orders

## 2017-12-30 NOTE — Progress Notes (Signed)
Subjective:    Patient ID: Kaitlyn Serrano, female    DOB: 29-Nov-1940, 77 y.o.   MRN: [id number]  Chief Complaint  Patient presents with  . Medical Management of Chronic Issues   PT presents to the office today for chronic follow up. Pt is scheduled for MRI of right hip because she has continued to have pain in right thigh after falling and hit a drawer. She is taking Norco as needed that has helped.  Hypertension  This is a chronic problem. The current episode started more than 1 year ago. The problem has been resolved since onset. Associated symptoms include anxiety and malaise/fatigue. Pertinent negatives include no headaches, peripheral edema or shortness of breath. Risk factors for coronary artery disease include dyslipidemia. The current treatment provides moderate improvement. There is no history of CAD/MI or heart failure.  Anxiety  Presents for follow-up visit. Symptoms include depressed mood, excessive worry, insomnia, irritability, nervous/anxious behavior and restlessness. Patient reports no shortness of breath. Symptoms occur most days. The quality of sleep is good.    Depression         This is a chronic problem.  The current episode started more than 1 year ago.   The problem occurs intermittently.  Associated symptoms include insomnia, irritable, restlessness and sad.  Associated symptoms include no helplessness, no hopelessness and no headaches.  Past treatments include SNRIs - Serotonin and norepinephrine reuptake inhibitors.  Past medical history includes anxiety.   Insomnia  Primary symptoms: difficulty falling asleep, frequent awakening, malaise/fatigue.  The current episode started more than one year. The onset quality is gradual. The problem occurs intermittently. The treatment provided mild relief. PMH includes: depression.  Cough  This is a new problem. The current episode started 1 to 4 weeks ago. The problem has been waxing and waning. The problem occurs every few  minutes. The cough is productive of sputum. Associated symptoms include postnasal drip and rhinorrhea. Pertinent negatives include no ear congestion, ear pain, headaches, nasal congestion, sore throat, shortness of breath or wheezing. The treatment provided mild relief.      Review of Systems  Constitutional: Positive for irritability and malaise/fatigue.  HENT: Positive for postnasal drip and rhinorrhea. Negative for ear pain and sore throat.   Respiratory: Positive for cough. Negative for shortness of breath and wheezing.   Neurological: Negative for headaches.  Psychiatric/Behavioral: Positive for depression. The patient is nervous/anxious and has insomnia.   All other systems reviewed and are negative.      Objective:   Physical Exam  Constitutional: She is oriented to person, place, and time. She appears well-developed and well-nourished. She is irritable. No distress.  HENT:  Head: Normocephalic and atraumatic.  Right Ear: External ear normal.  Left Ear: External ear normal.  Mouth/Throat: Oropharynx is clear and moist.  Eyes: Pupils are equal, round, and reactive to light.  Neck: Normal range of motion. Neck supple. No thyromegaly present.  Cardiovascular: Normal rate, regular rhythm, normal heart sounds and intact distal pulses.  No murmur heard. Pulmonary/Chest: Effort normal and breath sounds normal. No respiratory distress. She has no wheezes.  Abdominal: Soft. Bowel sounds are normal. She exhibits no distension. There is no tenderness.  Musculoskeletal: She exhibits no edema or tenderness.  Pain in right thigh with external rotation and weight bearing  Neurological: She is alert and oriented to person, place, and time. She has normal reflexes. No cranial nerve deficit.  Skin: Skin is warm and dry.  Psychiatric: She has a normal  mood and affect. Her behavior is normal. Judgment and thought content normal.  Vitals reviewed.     BP (!) 150/82   Pulse 66   Temp (!)  96.6 F (35.9 C) (Oral)   Ht '5\' 2"'  (1.575 m)   Wt 147 lb 12.8 oz (67 kg)   BMI 27.03 kg/m      Assessment & Plan:  Kaitlyn Serrano comes in today with chief complaint of Medical Management of Chronic Issues   Diagnosis and orders addressed:  1. Essential hypertension - CMP14+EGFR - CBC with Differential/Platelet  2. Gastroesophageal reflux disease without esophagitis - CMP14+EGFR - CBC with Differential/Platelet  3. Mixed hyperlipidemia - CMP14+EGFR - CBC with Differential/Platelet  4. GAD (generalized anxiety disorder) - CMP14+EGFR - CBC with Differential/Platelet - ALPRAZolam (XANAX) 0.5 MG tablet; TAKE 1/2 TABLET TWICE DAILY AS NEEDED FOR SLEEP OR ANXIETY  Dispense: 60 tablet; Refill: 5  5. Moderate episode of recurrent major depressive disorder (HCC) - CMP14+EGFR - CBC with Differential/Platelet  6. Constipation, unspecified constipation type - CMP14+EGFR - CBC with Differential/Platelet  7. Insomnia, unspecified type - CMP14+EGFR - CBC with Differential/Platelet  8. Vitamin D deficiency - CMP14+EGFR - CBC with Differential/Platelet  9. Benzodiazepine dependence (HCC) - CMP14+EGFR - CBC with Differential/Platelet  10. Controlled substance agreement signed - CMP14+EGFR - CBC with Differential/Platelet   Labs pending Health Maintenance reviewed Diet and exercise encouraged  Follow up plan: 6 months    Evelina Dun, FNP

## 2017-12-31 ENCOUNTER — Other Ambulatory Visit: Payer: Self-pay | Admitting: Family

## 2017-12-31 DIAGNOSIS — F411 Generalized anxiety disorder: Secondary | ICD-10-CM

## 2017-12-31 DIAGNOSIS — I10 Essential (primary) hypertension: Secondary | ICD-10-CM

## 2017-12-31 LAB — CBC WITH DIFFERENTIAL/PLATELET
BASOS ABS: 0.1 10*3/uL (ref 0.0–0.2)
BASOS: 1 %
EOS (ABSOLUTE): 0.5 10*3/uL — AB (ref 0.0–0.4)
Eos: 6 %
Hematocrit: 42.7 % (ref 34.0–46.6)
Hemoglobin: 14.3 g/dL (ref 11.1–15.9)
IMMATURE GRANS (ABS): 0 10*3/uL (ref 0.0–0.1)
Immature Granulocytes: 1 %
LYMPHS: 15 %
Lymphocytes Absolute: 1.2 10*3/uL (ref 0.7–3.1)
MCH: 29.6 pg (ref 26.6–33.0)
MCHC: 33.5 g/dL (ref 31.5–35.7)
MCV: 88 fL (ref 79–97)
Monocytes Absolute: 0.9 10*3/uL (ref 0.1–0.9)
Monocytes: 11 %
NEUTROS PCT: 66 %
Neutrophils Absolute: 5.7 10*3/uL (ref 1.4–7.0)
PLATELETS: 257 10*3/uL (ref 150–450)
RBC: 4.83 x10E6/uL (ref 3.77–5.28)
RDW: 12.5 % (ref 12.3–15.4)
WBC: 8.4 10*3/uL (ref 3.4–10.8)

## 2017-12-31 LAB — CMP14+EGFR
A/G RATIO: 1.6 (ref 1.2–2.2)
ALT: 12 IU/L (ref 0–32)
AST: 20 IU/L (ref 0–40)
Albumin: 3.8 g/dL (ref 3.5–4.8)
Alkaline Phosphatase: 98 IU/L (ref 39–117)
BUN/Creatinine Ratio: 25 (ref 12–28)
BUN: 15 mg/dL (ref 8–27)
Bilirubin Total: 0.4 mg/dL (ref 0.0–1.2)
CALCIUM: 9.3 mg/dL (ref 8.7–10.3)
CO2: 22 mmol/L (ref 20–29)
Chloride: 101 mmol/L (ref 96–106)
Creatinine, Ser: 0.61 mg/dL (ref 0.57–1.00)
GFR calc Af Amer: 101 mL/min/{1.73_m2} (ref >59)
GFR, EST NON AFRICAN AMERICAN: 88 mL/min/{1.73_m2} (ref >59)
GLUCOSE: 117 mg/dL — AB (ref 65–99)
Globulin, Total: 2.4 g/dL (ref 1.5–4.5)
POTASSIUM: 4.7 mmol/L (ref 3.5–5.2)
Sodium: 136 mmol/L (ref 134–144)
Total Protein: 6.2 g/dL (ref 6.0–8.5)

## 2018-01-07 ENCOUNTER — Other Ambulatory Visit: Payer: Self-pay | Admitting: Family

## 2018-01-07 DIAGNOSIS — F411 Generalized anxiety disorder: Secondary | ICD-10-CM

## 2018-01-24 ENCOUNTER — Ambulatory Visit
Admission: RE | Admit: 2018-01-24 | Discharge: 2018-01-24 | Disposition: A | Discharge status: Home / Self Care | Payer: Medicare Other | Source: Ambulatory Visit | Attending: Rehabilitation | Admitting: Rehabilitation

## 2018-01-24 DIAGNOSIS — M1611 Unilateral primary osteoarthritis, right hip: Secondary | ICD-10-CM

## 2018-02-02 ENCOUNTER — Other Ambulatory Visit: Payer: Self-pay | Admitting: Family

## 2018-02-02 DIAGNOSIS — F411 Generalized anxiety disorder: Secondary | ICD-10-CM

## 2018-02-02 DIAGNOSIS — I10 Essential (primary) hypertension: Secondary | ICD-10-CM

## 2018-02-06 ENCOUNTER — Other Ambulatory Visit: Payer: Self-pay | Admitting: *Deleted

## 2018-02-06 DIAGNOSIS — I10 Essential (primary) hypertension: Secondary | ICD-10-CM

## 2018-02-06 MED ORDER — TRAZODONE HCL 50 MG PO TABS: 50.0000 mg | ORAL_TABLET | Freq: Every day | ORAL | 0 refills | Status: DC

## 2018-02-06 MED ORDER — ATENOLOL 50 MG PO TABS: 50.0000 mg | ORAL_TABLET | Freq: Every day | ORAL | 0 refills | Status: DC

## 2018-03-09 ENCOUNTER — Other Ambulatory Visit: Payer: Self-pay | Admitting: Family

## 2018-03-09 ENCOUNTER — Other Ambulatory Visit: Payer: Self-pay | Admitting: Pediatrics

## 2018-03-09 DIAGNOSIS — F411 Generalized anxiety disorder: Secondary | ICD-10-CM

## 2018-03-09 DIAGNOSIS — I10 Essential (primary) hypertension: Secondary | ICD-10-CM

## 2018-03-09 DIAGNOSIS — K589 Irritable bowel syndrome without diarrhea: Secondary | ICD-10-CM

## 2018-03-10 ENCOUNTER — Telehealth: Payer: Self-pay | Admitting: Family

## 2018-03-10 NOTE — Telephone Encounter (Signed)
lmtcb- Medications where sent to [facility] yesterday not [facility]

## 2018-03-12 ENCOUNTER — Other Ambulatory Visit: Payer: Self-pay | Admitting: Family

## 2018-03-12 DIAGNOSIS — I10 Essential (primary) hypertension: Secondary | ICD-10-CM

## 2018-04-07 ENCOUNTER — Other Ambulatory Visit: Payer: Self-pay | Admitting: Family

## 2018-04-07 DIAGNOSIS — K589 Irritable bowel syndrome without diarrhea: Secondary | ICD-10-CM

## 2018-04-07 DIAGNOSIS — F411 Generalized anxiety disorder: Secondary | ICD-10-CM

## 2018-05-06 ENCOUNTER — Other Ambulatory Visit: Payer: Self-pay | Admitting: Family

## 2018-05-06 DIAGNOSIS — F411 Generalized anxiety disorder: Secondary | ICD-10-CM

## 2018-05-06 DIAGNOSIS — I10 Essential (primary) hypertension: Secondary | ICD-10-CM

## 2018-05-07 ENCOUNTER — Other Ambulatory Visit: Payer: Self-pay

## 2018-05-07 ENCOUNTER — Encounter: Payer: Self-pay | Admitting: Family

## 2018-05-07 ENCOUNTER — Ambulatory Visit (INDEPENDENT_AMBULATORY_CARE_PROVIDER_SITE_OTHER): Payer: Medicare Other | Admitting: Family

## 2018-05-07 VITALS — BP 142/93 | HR 109 | Temp 98.4°F | Ht 62.0 in | Wt 145.0 lb

## 2018-05-07 DIAGNOSIS — Z01818 Encounter for other preprocedural examination: Secondary | ICD-10-CM

## 2018-05-07 DIAGNOSIS — I10 Essential (primary) hypertension: Secondary | ICD-10-CM

## 2018-05-07 NOTE — Progress Notes (Signed)
Subjective:    Patient ID: Kaitlyn Serrano, female    DOB: 1940/11/18, 78 y.o.   MRN: [id number]  Chief Complaint  Patient presents with  . surgical clearance    EKG to be scanned into chart.    Pt presents to the office today for surgical clearance for right hip replacement. She states this will be scheduled as soon as she gets the clearance completed.   PT has seen Cardiologists in 2017 related to chest pain and palpitations. She denies any chest pain at this time, but has SOB at times. Her Echo in 2016 showed EF 60-65%.  Hip Pain   The incident occurred more than 1 week ago. The injury mechanism was a fall (over a year ago). The pain is present in the left hip. The quality of the pain is described as aching. The pain is at a severity of 10/10. The pain is moderate. The pain has been fluctuating since onset. Pertinent negatives include no loss of sensation, muscle weakness, numbness or tingling. She reports no foreign bodies present. The symptoms are aggravated by movement and weight bearing. She has tried non-weight bearing (Norco) for the symptoms. The treatment provided mild relief.      Review of Systems  Musculoskeletal: Positive for arthralgias and gait problem.  Neurological: Negative for tingling and numbness.  All other systems reviewed and are negative.      Objective:   Physical Exam Vitals signs reviewed.  Constitutional:      General: She is not in acute distress.    Appearance: She is well-developed.  HENT:     Head: Normocephalic and atraumatic.     Right Ear: Tympanic membrane normal.     Left Ear: Tympanic membrane normal.  Eyes:     Pupils: Pupils are equal, round, and reactive to light.  Neck:     Musculoskeletal: Normal range of motion and neck supple.     Thyroid: No thyromegaly.  Cardiovascular:     Rate and Rhythm: Normal rate and regular rhythm.     Heart sounds: Normal heart sounds. No murmur.  Pulmonary:     Effort: Pulmonary effort is  normal. No respiratory distress.     Breath sounds: Normal breath sounds. No wheezing.  Abdominal:     General: Bowel sounds are normal. There is no distension.     Palpations: Abdomen is soft.     Tenderness: There is no abdominal tenderness.  Musculoskeletal:        General: Tenderness present.     Comments: Pain in left hip with flexion and rotation.   Skin:    General: Skin is warm and dry.  Neurological:     Mental Status: She is alert and oriented to person, place, and time.     Cranial Nerves: No cranial nerve deficit.     Deep Tendon Reflexes: Reflexes are normal and symmetric.  Psychiatric:        Behavior: Behavior normal.        Thought Content: Thought content normal.        Judgment: Judgment normal.     BP (!) 142/93   Pulse (!) 109   Temp 98.4 F (36.9 C) (Oral)   Ht '5\' 2"'  (1.575 m)   Wt 145 lb (65.8 kg)   BMI 26.52 kg/m        Assessment & Plan:  MIRIAN CASCO comes in today with chief complaint of surgical clearance (EKG to be scanned into chart.)  Diagnosis and orders addressed:  1. Essential hypertension - Ambulatory referral to Cardiology  2. Pre-op evaluation Will need to be cleared by Cardiologists given age and hx Labs pending  Keep all Ortho appts  - CBC with Differential/Platelet - CMP14+EGFR - DG Chest 2 View; Future - EKG 12-Lead - Ambulatory referral to Cardiology    Evelina Dun, FNP

## 2018-05-07 NOTE — Telephone Encounter (Signed)
Please advise 

## 2018-05-08 ENCOUNTER — Telehealth: Payer: Self-pay | Admitting: Family

## 2018-05-08 DIAGNOSIS — M545 Low back pain, unspecified: Secondary | ICD-10-CM

## 2018-05-08 DIAGNOSIS — J45909 Unspecified asthma, uncomplicated: Secondary | ICD-10-CM

## 2018-05-08 DIAGNOSIS — I252 Old myocardial infarction: Secondary | ICD-10-CM

## 2018-05-08 DIAGNOSIS — R55 Syncope and collapse: Secondary | ICD-10-CM

## 2018-05-08 LAB — CBC WITH DIFFERENTIAL/PLATELET
BASOS ABS: 0.1 10*3/uL (ref 0.0–0.2)
Basos: 1 %
EOS (ABSOLUTE): 0.2 10*3/uL (ref 0.0–0.4)
Eos: 4 %
Hematocrit: 40.2 % (ref 34.0–46.6)
Hemoglobin: 14.4 g/dL (ref 11.1–15.9)
Immature Grans (Abs): 0.1 10*3/uL (ref 0.0–0.1)
Immature Granulocytes: 1 %
Lymphocytes Absolute: 1.2 10*3/uL (ref 0.7–3.1)
Lymphs: 20 %
MCH: 32.4 pg (ref 26.6–33.0)
MCHC: 35.8 g/dL — ABNORMAL HIGH (ref 31.5–35.7)
MCV: 91 fL (ref 79–97)
Monocytes Absolute: 0.7 10*3/uL (ref 0.1–0.9)
Monocytes: 13 %
Neutrophils Absolute: 3.5 10*3/uL (ref 1.4–7.0)
Neutrophils: 61 %
Platelets: 224 10*3/uL (ref 150–450)
RBC: 4.44 x10E6/uL (ref 3.77–5.28)
RDW: 12.9 % (ref 11.7–15.4)
WBC: 5.8 10*3/uL (ref 3.4–10.8)

## 2018-05-08 LAB — CMP14+EGFR
ALK PHOS: 94 IU/L (ref 39–117)
ALT: 13 IU/L (ref 0–32)
AST: 18 IU/L (ref 0–40)
Albumin/Globulin Ratio: 1.7 (ref 1.2–2.2)
Albumin: 4 g/dL (ref 3.7–4.7)
BUN/Creatinine Ratio: 15 (ref 12–28)
BUN: 11 mg/dL (ref 8–27)
Bilirubin Total: 0.6 mg/dL (ref 0.0–1.2)
CO2: 24 mmol/L (ref 20–29)
Calcium: 9.5 mg/dL (ref 8.7–10.3)
Chloride: 102 mmol/L (ref 96–106)
Creatinine, Ser: 0.75 mg/dL (ref 0.57–1.00)
GFR calc Af Amer: 89 mL/min/{1.73_m2} (ref >59)
GFR calc non Af Amer: 77 mL/min/{1.73_m2} (ref >59)
GLUCOSE: 97 mg/dL (ref 65–99)
Globulin, Total: 2.3 g/dL (ref 1.5–4.5)
Potassium: 4.6 mmol/L (ref 3.5–5.2)
Sodium: 140 mmol/L (ref 134–144)
Total Protein: 6.3 g/dL (ref 6.0–8.5)

## 2018-05-08 NOTE — Telephone Encounter (Signed)
LM for pt aware = we are sending rx to [facility]

## 2018-05-08 NOTE — Telephone Encounter (Signed)
[facility] pt - Dr Warrick Parisian will you sign a DME wheelchair order for this pt - I will print / if you will sign.

## 2018-05-08 NOTE — Telephone Encounter (Signed)
Yes go ahead and print it and I will sign it

## 2018-05-08 NOTE — Telephone Encounter (Signed)
PT states that we need to call [facility] about Pt needing to have a wheelchair for her home. She forgot to mention it yesterday when she was here

## 2018-05-11 ENCOUNTER — Telehealth: Payer: Self-pay | Admitting: Family

## 2018-05-11 NOTE — Telephone Encounter (Addendum)
Pharmacy is needing copy of pt insurance card, and recent height and weight sent to pharmacy. Fax number is [phone]

## 2018-05-12 ENCOUNTER — Ambulatory Visit (INDEPENDENT_AMBULATORY_CARE_PROVIDER_SITE_OTHER): Payer: Medicare Other | Admitting: Cardiology

## 2018-05-12 ENCOUNTER — Encounter: Payer: Self-pay | Admitting: Cardiology

## 2018-05-12 ENCOUNTER — Other Ambulatory Visit: Payer: Self-pay

## 2018-05-12 VITALS — BP 121/68 | HR 50 | Temp 97.4°F | Ht 62.0 in | Wt 147.6 lb

## 2018-05-12 DIAGNOSIS — Z01810 Encounter for preprocedural cardiovascular examination: Secondary | ICD-10-CM

## 2018-05-12 DIAGNOSIS — R002 Palpitations: Secondary | ICD-10-CM

## 2018-05-12 DIAGNOSIS — R0789 Other chest pain: Secondary | ICD-10-CM | POA: Diagnosis not present

## 2018-05-12 DIAGNOSIS — R001 Bradycardia, unspecified: Secondary | ICD-10-CM

## 2018-05-12 MED ORDER — ATENOLOL 25 MG PO TABS: 25.0000 mg | ORAL_TABLET | Freq: Every day | ORAL | 1 refills | Status: DC

## 2018-05-12 NOTE — Telephone Encounter (Signed)
Faxed to pharmacy

## 2018-05-12 NOTE — Progress Notes (Signed)
Clinical Summary Kaitlyn Serrano is a 78 y.o.female seen today for follow up of the following medical problems.   1. Leg swelling - echo showed LVEF 50-35%, grade I diastolic dysfunction.  - improved after stopping norvasc.   - no recent edema   2Chest pain - previous workup in 2012 per old [facility] notes with negative myoview for ischemia. Echo showed normal LV function -05/2014 completed exercise MPI. Low risk [facility] treadmill score of 6, no ischemia on imaging.  - no recent chest pain. No SOB/DOE - highest level of activity is doing housework/vacuuming, tolerates without troubles - can walk up flight of stairs without significant symptoms.   3. HTN - does not check regulalrly at home -compliant with meds   4. Palpitations - seen in ER 11/2015 with palpitations. Telemetry monitor throughtout visit showed NSR per ER notes. CT PE negative, negative evaluation for ACS - no recent symptoms. Since our last visit 01/2016 she has been started on diltiazem by another provider, remains on atenolol   5. Preop evaluation - being considered for hip replacement - highest level of activity is doing housework/vacuuming, tolerates without troubles - can walk up flight of stairs without significant symptoms.      Past Medical History:  Diagnosis Date  . Asthma   . Blood transfusion   . Blurred vision   . CAD (coronary artery disease)   . Chest pain   . CHF (congestive heart failure) ([place])   . Chronic pain syndrome   . Cough   . Cyanosis   . Diabetes ([place])   . Diverticulosis   . Double vision   . 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 Dysphagia   . Dyspnea   . Exposure to TB   . Facial pain   . Generalized weakness   . GERD (gastroesophageal reflux disease)   . GERD (gastroesophageal reflux disease)   . Glaucoma   . Hearing loss   . Heart murmur   . Hemorrhoids   . Hemorrhoids   . High cholesterol   . Hoarseness   . Hx of cardiovascular stress test    negative 2012 and 2016  . Hypertension   . IBS (irritable bowel syndrome)   . Insomnia   . Intervertebral disc disorder with myelopathy, lumbar region   . Irregular heart beat   . Leg swelling   . Lumbago   . Lumbar spine pain   . MI (myocardial infarction) ([place])   . Nasal congestion   . Night sweats   . Palpitations   . Rectal bleeding   . Renal insufficiency   . Ringing in ears   . Sinus infection   . Spondylosis with myelopathy, lumbar region   . Syncope   . Valvular heart disease   . Vertigo   . Vision loss   . Visual disorder   . Wheezing      Allergies  Allergen Reactions  . Bee Venom Anaphylaxis  . Amlodipine Swelling  . Elavil [Amitriptyline Hcl] Other (See Comments)    confusion  . Statins Swelling and Other (See Comments)    Peeling of skin  . Penicillins Rash    Has patient had a PCN reaction causing immediate rash, facial/tongue/throat swelling, SOB or lightheadedness with hypotension: Yes Has patient had a PCN reaction causing severe rash involving mucus membranes or skin necrosis: Yes Has patient had a PCN reaction that required hospitalization: Yes Has patient had a PCN reaction occurring within the last 10  years: No If all of the above answers are "NO", then may proceed with Cephalosporin use.      Current Outpatient Medications  Medication Sig Dispense Refill  . ALPRAZolam (XANAX) 0.5 MG tablet TAKE 1/2 TABLET TWICE DAILY AS NEEDED FOR SLEEP OR ANXIETY 60 tablet 5  . atenolol (TENORMIN) 50 MG tablet TAKE 1 TABLET DAILY 90 tablet 0  . busPIRone (BUSPAR) 7.5 MG tablet TAKE (1) TABLET THREE TIMES DAILY. 90 tablet 0  . dicyclomine (BENTYL) 10 MG capsule TAKE  (1)  CAPSULE  TWICE DAILY (TAKE ON AN EMPTY STOMACH AT LEAST 30MIN- UTES BEFORE MEALS). 60 capsule 0  . diltiazem (TIAZAC) 120 MG 24 hr capsule TAKE (1) CAPSULE DAILY 90 capsule 0  . DULoxetine (CYMBALTA) 30 MG capsule TAKE (1) CAPSULE DAILY 90 capsule 0  . EPINEPHrine 0.3 mg/0.3 mL IJ SOAJ  injection Inject 0.3 mLs (0.3 mg total) into the muscle once. 2 Device 1  . escitalopram (LEXAPRO) 5 MG tablet Take 5 mg by mouth daily.     . Estradiol Acetate (FEMRING) 0.05 MG/24HR RING INSERT (1) INTO THE VAGINA AS DIR- ECTED. -FOR VAGINAL USE- 1 each 4  . fluticasone (FLONASE) 50 MCG/ACT nasal spray Place 2 sprays daily into both nostrils. (Patient taking differently: Place 2 sprays into both nostrils daily as needed for allergies. ) 16 g 6  . furosemide (LASIX) 20 MG tablet TAKE 1 TABLET DAILY 30 tablet 3  . HYDROcodone-acetaminophen (NORCO) 10-325 MG tablet     . lisinopril (PRINIVIL,ZESTRIL) 10 MG tablet TAKE 1 TABLET DAILY 90 tablet 0  . loratadine (CLARITIN) 10 MG tablet Take 1 tablet (10 mg total) daily by mouth. 30 tablet 11  . polyethylene glycol powder (GLYCOLAX/MIRALAX) powder Take 17 g by mouth 2 (two) times daily as needed. 3162 g 0  . traZODone (DESYREL) 50 MG tablet Take 1 tablet (50 mg total) by mouth at bedtime. 90 tablet 0   No current facility-administered medications for this visit.      Past Surgical History:  Procedure Laterality Date  . ABDOMINAL HYSTERECTOMY    . BACK SURGERY    . BONE MARROW ASPIRATION    . CERVICAL SPINE SURGERY    . FIXATION KYPHOPLASTY    . LAMINOTOMY    . SPINAL FUSION       Allergies  Allergen Reactions  . Bee Venom Anaphylaxis  . Amlodipine Swelling  . Elavil [Amitriptyline Hcl] Other (See Comments)    confusion  . Statins Swelling and Other (See Comments)    Peeling of skin  . Penicillins Rash    Has patient had a PCN reaction causing immediate rash, facial/tongue/throat swelling, SOB or lightheadedness with hypotension: Yes Has patient had a PCN reaction causing severe rash involving mucus membranes or skin necrosis: Yes Has patient had a PCN reaction that required hospitalization: Yes Has patient had a PCN reaction occurring within the last 10 years: No If all of the above answers are "NO", then may proceed with  Cephalosporin use.       Family History  Problem Relation Age of Onset  . Diabetes Mother   . Heart disease Mother   . Hypertension Mother   . Gallbladder disease Mother   . Heart disease Father   . Pneumonia Sister   . Hypertension Brother   . Hyperlipidemia Brother   . Gallbladder disease Other   . Hypertension Child      Social History Ms. Route reports that she quit smoking about 54 years  ago. Her smoking use included cigarettes. She started smoking about 57 years ago. She has a 12.00 pack-year smoking history. She has never used smokeless tobacco. Ms. Largent reports no history of alcohol use.   Review of Systems CONSTITUTIONAL: No weight loss, fever, chills, weakness or fatigue.  HEENT: Eyes: No visual loss, blurred vision, double vision or yellow sclerae.No hearing loss, sneezing, congestion, runny nose or sore throat.  SKIN: No rash or itching.  CARDIOVASCULAR: per hpi RESPIRATORY: No shortness of breath, cough or sputum.  GASTROINTESTINAL: No anorexia, nausea, vomiting or diarrhea. No abdominal pain or blood.  GENITOURINARY: No burning on urination, no polyuria NEUROLOGICAL: No headache, dizziness, syncope, paralysis, ataxia, numbness or tingling in the extremities. No change in bowel or bladder control.  MUSCULOSKELETAL: +hip pain LYMPHATICS: No enlarged nodes. No history of splenectomy.  PSYCHIATRIC: No history of depression or anxiety.  ENDOCRINOLOGIC: No reports of sweating, cold or heat intolerance. No polyuria or polydipsia.  Marland Kitchen   Physical Examination Today's Vitals   05/12/18 1448  BP: 121/68  Pulse: (!) 50  Temp: (!) 97.4 F (36.3 C)  TempSrc: Oral  SpO2: 97%  Weight: 147 lb 9.6 oz (67 kg)  Height: 5\' 2"  (1.575 m)   Body mass index is 27 kg/m.  Gen: resting comfortably, no acute distress HEENT: no scleral icterus, pupils equal round and reactive, no palptable cervical adenopathy,  CV: RRR, no m/rg, no jvd Resp: Clear to auscultation  bilaterally GI: abdomen is soft, non-tender, non-distended, normal bowel sounds, no hepatosplenomegaly MSK: extremities are warm, no edema.  Skin: warm, no rash Neuro:  no focal deficits Psych: appropriate affect   Diagnostic Studies Echo 04/2010 [facility] LVEF >56%, grade I diastolic dysfunction, mild MR  04/2010 ExerciseMyoview No ischemia. Exercised 8 minutes, 9 METs, 95% THR.  12/2007 Renal artery [place] No stenosis, normal kidney size.   06/01/13 Clinic EKG NSR, LAD  05/2014 Exercise MPI IMPRESSION: 1. No reversible ischemia or infarction.  2. Normal left ventricular wall motion.  3. Left ventricular ejection fraction 57%  4. Low-risk stress test findings*.   08/2014 Echo Study Conclusions  - Left ventricle: The cavity size was normal. Wall thickness was normal. Systolic function was normal. The estimated ejection fraction was in the range of 60% to 65%. Wall motion was normal; there were no regional wall motion abnormalities. Doppler parameters are consistent with abnormal left ventricular relaxation (grade 1 diastolic dysfunction). - Aortic valve: Mildly calcified annulus. Trileaflet. - Mitral valve: Mildly calcified annulus. Mildly calcified leaflets . There was mild regurgitation. - Tricuspid valve: There was mild regurgitation.     Assessment and Plan  1. Leg swelling - resolved, looks to have been related to norvasc though she is on low dose diuretic - continue to monitor.   2. Chest pain - several year history, negative stress testing in 2012 and 2016 - no recent symptoms, continue to monitor.    3. HTN -she is at goal, continue current meds  4. Bradycardia/Palpitations - appears she is on both atenolol and dilitazem. She has a history of palpitations, was on atenolol at our last visit in 2017, unclear when dilt was started - EKG today shows sinus brady at 47, we will lower her atenolol to 25mg  daily. If low heart rates  continue would just d/c atenolol. If need more bp control room to titrate lisinopril.   5. Preoperative evaluation - no active cardiac conditions. Prior cardiac testing has been bening though a few years ago. From her verbal report  tolerates greater than 4 METs without issues. From cardiac standpoint ok to proceed with hip surgery  F/u 6 months Arnoldo Lenis, M.D.

## 2018-05-12 NOTE — Patient Instructions (Addendum)
Your physician wants you to follow-up in: [place] will receive a reminder letter in the mail two months in advance. If you don't receive a letter, please call our office to schedule the follow-up appointment.  Your physician has recommended you make the following change in your medication:   DECREASE ATENOLOL 25 MG DAILY    Thank you for choosing [facility]!!

## 2018-05-13 ENCOUNTER — Telehealth: Payer: Self-pay | Admitting: Cardiology

## 2018-05-13 NOTE — Telephone Encounter (Signed)
Patient called requesting to see if she has been approved for hip surgery.

## 2018-05-15 NOTE — Telephone Encounter (Signed)
Left message to return call 

## 2018-05-18 NOTE — Telephone Encounter (Signed)
We have left multiple messages

## 2018-05-25 ENCOUNTER — Other Ambulatory Visit: Payer: Self-pay | Admitting: Family

## 2018-05-25 DIAGNOSIS — I10 Essential (primary) hypertension: Secondary | ICD-10-CM

## 2018-05-26 ENCOUNTER — Other Ambulatory Visit: Payer: Self-pay | Admitting: Family

## 2018-06-01 ENCOUNTER — Telehealth: Payer: Self-pay | Admitting: Family

## 2018-06-01 NOTE — Telephone Encounter (Signed)
Patient has a follow up telephone appointment scheduled. 

## 2018-06-02 ENCOUNTER — Ambulatory Visit (INDEPENDENT_AMBULATORY_CARE_PROVIDER_SITE_OTHER): Payer: Medicare Other | Admitting: Family

## 2018-06-02 ENCOUNTER — Inpatient Hospital Stay: Admit: 2018-06-02 | Payer: Self-pay | Admitting: Orthopedic Surgery

## 2018-06-02 ENCOUNTER — Other Ambulatory Visit: Payer: Self-pay

## 2018-06-02 SURGERY — ARTHROPLASTY, HIP, TOTAL, ANTERIOR APPROACH
Anesthesia: Spinal | Laterality: Right

## 2018-06-02 NOTE — Progress Notes (Signed)
   Virtual Visit via telephone Note Attempted to call patient at 10:33 Am, no answer. Will try to call back.  Attempted to call again at 10:54 AM,  No answer. Left message. No call returned. Will close chart at this time.

## 2018-06-03 ENCOUNTER — Telehealth: Payer: Self-pay | Admitting: Family

## 2018-06-03 ENCOUNTER — Other Ambulatory Visit: Payer: Self-pay | Admitting: Family

## 2018-06-03 DIAGNOSIS — K5900 Constipation, unspecified: Secondary | ICD-10-CM

## 2018-06-03 DIAGNOSIS — K589 Irritable bowel syndrome without diarrhea: Secondary | ICD-10-CM

## 2018-06-03 MED ORDER — POLYETHYLENE GLYCOL 3350 17 GM/SCOOP PO POWD: 17.0000 g | Freq: Two times a day (BID) | ORAL | 0 refills | Status: DC | PRN

## 2018-06-03 NOTE — Telephone Encounter (Signed)
Patient notified and verbalized understanding. 

## 2018-06-03 NOTE — Telephone Encounter (Signed)
Please review and advise.

## 2018-06-03 NOTE — Telephone Encounter (Signed)
PT is needing something sent in for constipation    [facility]

## 2018-06-03 NOTE — Telephone Encounter (Signed)
Miralax Prescription sent to pharmacy  ° °

## 2018-06-11 ENCOUNTER — Encounter: Payer: Self-pay | Admitting: Family

## 2018-06-11 ENCOUNTER — Telehealth: Payer: Self-pay | Admitting: Family

## 2018-06-11 ENCOUNTER — Ambulatory Visit (INDEPENDENT_AMBULATORY_CARE_PROVIDER_SITE_OTHER): Payer: Medicare Other | Admitting: Family

## 2018-06-11 ENCOUNTER — Other Ambulatory Visit: Payer: Self-pay

## 2018-06-11 DIAGNOSIS — J019 Acute sinusitis, unspecified: Secondary | ICD-10-CM | POA: Diagnosis not present

## 2018-06-11 DIAGNOSIS — M1611 Unilateral primary osteoarthritis, right hip: Secondary | ICD-10-CM | POA: Diagnosis not present

## 2018-06-11 MED ORDER — DOXYCYCLINE HYCLATE 100 MG PO TABS: 100.0000 mg | ORAL_TABLET | Freq: Two times a day (BID) | ORAL | 0 refills | Status: DC

## 2018-06-11 NOTE — Progress Notes (Signed)
Virtual Visit via telephone Note  I connected with Kaitlyn Serrano on 06/11/18 at 10:52 AM by telephone and verified that I am speaking with the correct person using two identifiers. Kaitlyn Serrano is currently located at home and daughter is currently with her during visit. The provider, Evelina Dun, FNP is located in their office at time of visit.  I discussed the limitations, risks, security and privacy concerns of performing an evaluation and management service by telephone and the availability of in person appointments. I also discussed with the patient that there may be a patient responsible charge related to this service. The patient expressed understanding and agreed to proceed.   History and Present Illness:  PT calls today with complaints of sinus pressure.  Sinusitis  This is a new problem. The current episode started 1 to 4 weeks ago. The problem has been gradually worsening since onset. There has been no fever. Her pain is at a severity of 10/10. The pain is moderate. Associated symptoms include chills, congestion, coughing (" a little"), ear pain, headaches, sinus pressure and sneezing. Pertinent negatives include no sore throat. Treatments tried: Zyrtec  The treatment provided mild relief.  Hip Pain   The incident occurred more than 1 week ago. The injury mechanism was a fall. The pain is present in the right hip. The pain is at a severity of 10/10. The pain is moderate. The symptoms are aggravated by weight bearing. She has tried rest and non-weight bearing for the symptoms. The treatment provided mild relief.      Review of Systems  Constitutional: Positive for chills.  HENT: Positive for congestion, ear pain, sinus pressure and sneezing. Negative for sore throat.   Respiratory: Positive for cough (" a little").   Neurological: Positive for headaches.    Observations/Objective: No SOB or distress noted  Assessment and Plan: Kaitlyn Serrano comes in today with  chief complaint of No chief complaint on file.   Diagnosis and orders addressed:  1. Acute sinusitis, recurrence not specified, unspecified location - Take meds as prescribed - Use a cool mist humidifier  -Use saline nose sprays frequently -Force fluids -For any cough or congestion  Use plain Mucinex- regular strength or max strength is fine -For fever or aces or pains- take tylenol or ibuprofen. -Throat lozenges if help -RTO if symptoms worsen or do not improve  - doxycycline (VIBRA-TABS) 100 MG tablet; Take 1 tablet (100 mg total) by mouth 2 (two) times daily.  Dispense: 20 tablet; Refill: 0  2. Primary osteoarthritis of right hip Rest Keep Ortho appts  ROM exercises discussed - Home Health - Face-to-face encounter (required for Medicare/Medicaid patients)      I discussed the assessment and treatment plan with the patient. The patient was provided an opportunity to ask questions and all were answered. The patient agreed with the plan and demonstrated an understanding of the instructions.   The patient was advised to call back or seek an in-person evaluation if the symptoms worsen or if the condition fails to improve as anticipated.  The above assessment and management plan was discussed with the patient. The patient verbalized understanding of and has agreed to the management plan. Patient is aware to call the clinic if symptoms persist or worsen. Patient is aware when to return to the clinic for a follow-up visit. Patient educated on when it is appropriate to go to the emergency department.    Called end 11:11 AM, I provided 19 minutes of  non-face-to-face time during this encounter.    Evelina Dun, FNP

## 2018-06-11 NOTE — Telephone Encounter (Signed)
Pt daughter is wanting to talk to nurse about her mothers telephone visit she had earlier with christy

## 2018-06-11 NOTE — Telephone Encounter (Signed)
Daughter states that she would like to talk to [place] about her telephone visit that patient had today. States that she had some things she wanted to talk with [name] about and was on the other side of the house when patient was on her phone visit. Please advise

## 2018-06-19 ENCOUNTER — Other Ambulatory Visit: Payer: Self-pay

## 2018-06-19 ENCOUNTER — Ambulatory Visit (INDEPENDENT_AMBULATORY_CARE_PROVIDER_SITE_OTHER): Payer: Medicare Other | Admitting: Family

## 2018-06-19 ENCOUNTER — Encounter: Payer: Self-pay | Admitting: Family

## 2018-06-19 DIAGNOSIS — Z79899 Other long term (current) drug therapy: Secondary | ICD-10-CM | POA: Diagnosis not present

## 2018-06-19 DIAGNOSIS — F411 Generalized anxiety disorder: Secondary | ICD-10-CM | POA: Diagnosis not present

## 2018-06-19 DIAGNOSIS — F1320 Sedative, hypnotic or anxiolytic dependence, uncomplicated: Secondary | ICD-10-CM

## 2018-06-19 MED ORDER — ALPRAZOLAM 0.5 MG PO TABS: ORAL_TABLET | ORAL | 5 refills | Status: DC

## 2018-06-19 NOTE — Progress Notes (Signed)
   Virtual Visit via telephone Note  I connected with Kaitlyn Serrano on 06/19/18 at 1:45 pm by telephone and verified that I am speaking with the correct person using two identifiers. ADRIONNA Serrano is currently located at home and no one is currently with her during visit. The provider, Evelina Dun, FNP is located in their office at time of visit.  I discussed the limitations, risks, security and privacy concerns of performing an evaluation and management service by telephone and the availability of in person appointments. I also discussed with the patient that there may be a patient responsible charge related to this service. The patient expressed understanding and agreed to proceed.   History and Present Illness:   Pt calls today requesting refill xanax. States with the COVID-19 and her hip pain her anxiety is worsen.   Anxiety  Presents for follow-up visit. Symptoms include decreased concentration, depressed mood, excessive worry, irritability, nervous/anxious behavior, panic and restlessness. Symptoms occur occasionally. The severity of symptoms is moderate. The quality of sleep is good.        Review of Systems  Constitutional: Positive for irritability.  Psychiatric/Behavioral: Positive for decreased concentration. The patient is nervous/anxious.   All other systems reviewed and are negative.  Observations/Objective: No SOB or distress noted  Assessment and Plan: 1. GAD (generalized anxiety disorder) - ALPRAZolam (XANAX) 0.5 MG tablet; TAKE 1/2 TABLET TWICE DAILY AS NEEDED FOR SLEEP OR ANXIETY  Dispense: 60 tablet; Refill: 5  2. Benzodiazepine dependence (HCC) - ALPRAZolam (XANAX) 0.5 MG tablet; TAKE 1/2 TABLET TWICE DAILY AS NEEDED FOR SLEEP OR ANXIETY  Dispense: 60 tablet; Refill: 5  3. Controlled substance agreement signed - ALPRAZolam (XANAX) 0.5 MG tablet; TAKE 1/2 TABLET TWICE DAILY AS NEEDED FOR SLEEP OR ANXIETY  Dispense: 60 tablet; Refill: 5  Will refill  xanax today Stress management discussed PT reviewed in [place] controlled database- No red flags noted   I discussed the assessment and treatment plan with the patient. The patient was provided an opportunity to ask questions and all were answered. The patient agreed with the plan and demonstrated an understanding of the instructions.   The patient was advised to call back or seek an in-person evaluation if the symptoms worsen or if the condition fails to improve as anticipated.  The above assessment and management plan was discussed with the patient. The patient verbalized understanding of and has agreed to the management plan. Patient is aware to call the clinic if symptoms persist or worsen. Patient is aware when to return to the clinic for a follow-up visit. Patient educated on when it is appropriate to go to the emergency department.    Call ended 1:56 pm, I provided 11 minutes of non-face-to-face time during this encounter.    Evelina Dun, FNP

## 2018-06-30 ENCOUNTER — Ambulatory Visit: Payer: Medicare Other | Admitting: Family

## 2018-07-10 ENCOUNTER — Encounter (INDEPENDENT_AMBULATORY_CARE_PROVIDER_SITE_OTHER): Payer: Self-pay

## 2018-07-10 ENCOUNTER — Encounter: Payer: Self-pay | Admitting: Family

## 2018-07-10 ENCOUNTER — Telehealth: Payer: Self-pay | Admitting: Family

## 2018-07-10 ENCOUNTER — Other Ambulatory Visit: Payer: Self-pay

## 2018-07-10 ENCOUNTER — Ambulatory Visit (INDEPENDENT_AMBULATORY_CARE_PROVIDER_SITE_OTHER): Payer: Medicare Other | Admitting: Family

## 2018-07-10 VITALS — BP 157/87 | HR 55 | Temp 98.9°F | Ht 62.0 in | Wt 146.8 lb

## 2018-07-10 DIAGNOSIS — F1320 Sedative, hypnotic or anxiolytic dependence, uncomplicated: Secondary | ICD-10-CM | POA: Diagnosis not present

## 2018-07-10 DIAGNOSIS — K5900 Constipation, unspecified: Secondary | ICD-10-CM

## 2018-07-10 DIAGNOSIS — K219 Gastro-esophageal reflux disease without esophagitis: Secondary | ICD-10-CM

## 2018-07-10 DIAGNOSIS — Z79899 Other long term (current) drug therapy: Secondary | ICD-10-CM | POA: Diagnosis not present

## 2018-07-10 DIAGNOSIS — F411 Generalized anxiety disorder: Secondary | ICD-10-CM

## 2018-07-10 DIAGNOSIS — F331 Major depressive disorder, recurrent, moderate: Secondary | ICD-10-CM

## 2018-07-10 DIAGNOSIS — E663 Overweight: Secondary | ICD-10-CM

## 2018-07-10 DIAGNOSIS — E782 Mixed hyperlipidemia: Secondary | ICD-10-CM

## 2018-07-10 DIAGNOSIS — G4700 Insomnia, unspecified: Secondary | ICD-10-CM

## 2018-07-10 DIAGNOSIS — I159 Secondary hypertension, unspecified: Secondary | ICD-10-CM

## 2018-07-10 LAB — CBC WITH DIFFERENTIAL/PLATELET
Basophils Absolute: 0.1 10*3/uL (ref 0.0–0.2)
Basos: 1 %
EOS (ABSOLUTE): 0.4 10*3/uL (ref 0.0–0.4)
Eos: 4 %
Hematocrit: 42.2 % (ref 34.0–46.6)
Hemoglobin: 14.2 g/dL (ref 11.1–15.9)
Immature Grans (Abs): 0.1 10*3/uL (ref 0.0–0.1)
Immature Granulocytes: 1 %
Lymphocytes Absolute: 1.3 10*3/uL (ref 0.7–3.1)
Lymphs: 13 %
MCH: 30.2 pg (ref 26.6–33.0)
MCHC: 33.6 g/dL (ref 31.5–35.7)
MCV: 90 fL (ref 79–97)
Monocytes Absolute: 1 10*3/uL — ABNORMAL HIGH (ref 0.1–0.9)
Monocytes: 10 %
Neutrophils Absolute: 7.6 10*3/uL — ABNORMAL HIGH (ref 1.4–7.0)
Neutrophils: 71 %
Platelets: 281 10*3/uL (ref 150–450)
RBC: 4.7 x10E6/uL (ref 3.77–5.28)
RDW: 11.7 % (ref 11.7–15.4)
WBC: 10.6 10*3/uL (ref 3.4–10.8)

## 2018-07-10 LAB — CMP14+EGFR
ALT: 9 IU/L (ref 0–32)
AST: 13 IU/L (ref 0–40)
Albumin/Globulin Ratio: 1.7 (ref 1.2–2.2)
Albumin: 4 g/dL (ref 3.7–4.7)
Alkaline Phosphatase: 106 IU/L (ref 39–117)
BUN/Creatinine Ratio: 12 (ref 12–28)
BUN: 8 mg/dL (ref 8–27)
Bilirubin Total: 0.5 mg/dL (ref 0.0–1.2)
CO2: 24 mmol/L (ref 20–29)
Calcium: 9.4 mg/dL (ref 8.7–10.3)
Chloride: 102 mmol/L (ref 96–106)
Creatinine, Ser: 0.69 mg/dL (ref 0.57–1.00)
GFR calc Af Amer: 96 mL/min/{1.73_m2} (ref >59)
GFR calc non Af Amer: 84 mL/min/{1.73_m2} (ref >59)
Globulin, Total: 2.4 g/dL (ref 1.5–4.5)
Glucose: 99 mg/dL (ref 65–99)
Potassium: 4.7 mmol/L (ref 3.5–5.2)
Sodium: 139 mmol/L (ref 134–144)
Total Protein: 6.4 g/dL (ref 6.0–8.5)

## 2018-07-10 LAB — LIPID PANEL
Chol/HDL Ratio: 3.5 ratio (ref 0.0–4.4)
Cholesterol, Total: 176 mg/dL (ref 100–199)
HDL: 51 mg/dL (ref >39)
LDL Calculated: 102 mg/dL — ABNORMAL HIGH (ref 0–99)
Triglycerides: 113 mg/dL (ref 0–149)
VLDL Cholesterol Cal: 23 mg/dL (ref 5–40)

## 2018-07-10 MED ORDER — FLUTICASONE PROPIONATE 50 MCG/ACT NA SUSP: 2.0000 | Freq: Every day | NASAL | 6 refills | Status: DC

## 2018-07-10 MED ORDER — ALPRAZOLAM 0.5 MG PO TABS: ORAL_TABLET | ORAL | 5 refills | Status: DC

## 2018-07-10 NOTE — Patient Instructions (Signed)

## 2018-07-10 NOTE — Progress Notes (Signed)
Subjective:    Patient ID: Kaitlyn Serrano, female    DOB: 03/14/1940, 78 y.o.   MRN: [id number]  Chief Complaint  Patient presents with  . Hypertension   PT presents to the office today for chronic follow up. Pt is scheduled for surgery on her right hip which was postpone related to COVID. She states her pain is a 10 out 10.  Hypertension  This is a chronic problem. The current episode started more than 1 year ago. The problem has been waxing and waning since onset. The problem is uncontrolled. Associated symptoms include anxiety and malaise/fatigue. Pertinent negatives include no peripheral edema or shortness of breath. Risk factors for coronary artery disease include dyslipidemia, obesity and sedentary lifestyle. The current treatment provides mild improvement. There is no history of kidney disease, CAD/MI or heart failure.  Anxiety  Presents for follow-up visit. Symptoms include decreased concentration, excessive worry, insomnia, irritability, nervous/anxious behavior, panic and restlessness. Patient reports no shortness of breath. Symptoms occur occasionally. The severity of symptoms is moderate.    Depression         This is a chronic problem.  The current episode started more than 1 year ago.   The onset quality is sudden.   The problem occurs intermittently.  The problem has been waxing and waning since onset.  Associated symptoms include decreased concentration, insomnia, irritable, restlessness, decreased interest and sad.  Associated symptoms include no helplessness and no hopelessness.  Past medical history includes anxiety.   Hyperlipidemia  This is a chronic problem. The current episode started more than 1 year ago. The problem is uncontrolled. Recent lipid tests were reviewed and are high. Exacerbating diseases include obesity. Pertinent negatives include no shortness of breath. Current antihyperlipidemic treatment includes bile acid squestrants. The current treatment provides  moderate improvement of lipids. Risk factors for coronary artery disease include dyslipidemia, hypertension and a sedentary lifestyle.  Insomnia  Primary symptoms: difficulty falling asleep, frequent awakening, malaise/fatigue.  The current episode started more than one year. The onset quality is gradual. The problem has been waxing and waning since onset. PMH includes: depression.  Gastroesophageal Reflux  She complains of belching and heartburn. This is a chronic problem. The current episode started more than 1 year ago. The problem occurs occasionally.      Review of Systems  Constitutional: Positive for irritability and malaise/fatigue.  Respiratory: Negative for shortness of breath.   Gastrointestinal: Positive for heartburn.  Musculoskeletal: Positive for arthralgias.  Psychiatric/Behavioral: Positive for decreased concentration and depression. The patient is nervous/anxious and has insomnia.   All other systems reviewed and are negative.      Objective:   Physical Exam Vitals signs reviewed.  Constitutional:      General: She is irritable. She is not in acute distress.    Appearance: She is well-developed.  HENT:     Head: Normocephalic and atraumatic.     Right Ear: Tympanic membrane normal.     Left Ear: Tympanic membrane normal.  Eyes:     Pupils: Pupils are equal, round, and reactive to light.  Neck:     Musculoskeletal: Normal range of motion and neck supple.     Thyroid: No thyromegaly.  Cardiovascular:     Rate and Rhythm: Normal rate and regular rhythm.     Heart sounds: Normal heart sounds. No murmur.  Pulmonary:     Effort: Pulmonary effort is normal. No respiratory distress.     Breath sounds: Normal breath sounds. No wheezing.  Abdominal:     General: Bowel sounds are normal. There is no distension.     Palpations: Abdomen is soft.     Tenderness: There is no abdominal tenderness.  Musculoskeletal:        General: Tenderness present.     Comments:  Right hip pain with rotation and flexion  Skin:    General: Skin is warm and dry.  Neurological:     Mental Status: She is alert and oriented to person, place, and time.     Cranial Nerves: No cranial nerve deficit.     Deep Tendon Reflexes: Reflexes are normal and symmetric.  Psychiatric:        Behavior: Behavior normal.        Thought Content: Thought content normal.        Judgment: Judgment normal.       BP (!) 157/87   Pulse (!) 55   Temp 98.9 F (37.2 C) (Oral)   Ht '5\' 2"'  (1.575 m)   Wt 146 lb 12.8 oz (66.6 kg)   BMI 26.85 kg/m      Assessment & Plan:  LEELAH HANNA comes in today with chief complaint of Hypertension   Diagnosis and orders addressed:  1. GAD (generalized anxiety disorder) - CMP14+EGFR - CBC with Differential/Platelet - ToxASSURE Select 13 (MW), Urine - ALPRAZolam (XANAX) 0.5 MG tablet; TAKE 1/2 TABLET TWICE DAILY AS NEEDED FOR SLEEP OR ANXIETY  Dispense: 60 tablet; Refill: 5  2. Controlled substance agreement signed - CMP14+EGFR - CBC with Differential/Platelet - ToxASSURE Select 13 (MW), Urine - ALPRAZolam (XANAX) 0.5 MG tablet; TAKE 1/2 TABLET TWICE DAILY AS NEEDED FOR SLEEP OR ANXIETY  Dispense: 60 tablet; Refill: 5  3. Constipation, unspecified constipation type - CMP14+EGFR - CBC with Differential/Platelet  4. Benzodiazepine dependence (HCC) - CMP14+EGFR - CBC with Differential/Platelet - ToxASSURE Select 13 (MW), Urine - ALPRAZolam (XANAX) 0.5 MG tablet; TAKE 1/2 TABLET TWICE DAILY AS NEEDED FOR SLEEP OR ANXIETY  Dispense: 60 tablet; Refill: 5  5. Mixed hyperlipidemia - CMP14+EGFR - CBC with Differential/Platelet - Lipid panel  6. Moderate episode of recurrent major depressive disorder (HCC) - CMP14+EGFR - CBC with Differential/Platelet  7. Insomnia, unspecified type - CMP14+EGFR - CBC with Differential/Platelet  8. Secondary hypertension - CMP14+EGFR - CBC with Differential/Platelet  9. Gastroesophageal reflux  disease without esophagitis - CMP14+EGFR - CBC with Differential/Platelet  10. Overweight (BMI 25.0-29.9) - CMP14+EGFR - CBC with Differential/Platelet   Labs pending Pt reviewed in [place] controlled database- No red flags, Ortho has given her Norco for hip pain.  Health Maintenance reviewed Diet and exercise encouraged  Follow up plan: 3 months    Evelina Dun, FNP

## 2018-07-14 LAB — TOXASSURE SELECT 13 (MW), URINE

## 2018-07-17 ENCOUNTER — Other Ambulatory Visit: Payer: Self-pay | Admitting: Family

## 2018-07-22 ENCOUNTER — Other Ambulatory Visit: Payer: Self-pay | Admitting: Family

## 2018-07-22 DIAGNOSIS — F411 Generalized anxiety disorder: Secondary | ICD-10-CM

## 2018-07-29 ENCOUNTER — Other Ambulatory Visit: Payer: Self-pay | Admitting: Family

## 2018-07-29 ENCOUNTER — Emergency Department (HOSPITAL_COMMUNITY)
Admission: EM | Admit: 2018-07-29 | Discharge: 2018-07-29 | Discharge status: Absent without leave | Payer: Medicare Other

## 2018-07-29 DIAGNOSIS — F411 Generalized anxiety disorder: Secondary | ICD-10-CM

## 2018-07-29 DIAGNOSIS — I10 Essential (primary) hypertension: Secondary | ICD-10-CM

## 2018-08-04 NOTE — Progress Notes (Signed)
CARDIAC CLEARANCE NOTE DR [facility] 05-12-18 Epic EKG 05-12-18 EPIC

## 2018-08-04 NOTE — Patient Instructions (Addendum)
LOVADA BARWICK     Your procedure is scheduled on: 08-11-2018   Report to [facility] Main  Entrance    Report to admitting at [place] [facility] TEST ON 08-07-18  @ 2:00PM_______, THIS TEST MUST BE DONE BEFORE SURGERY, COME TO [place]. ONCE YOUR TEST IS COMPLETE, PLEASE BEGIN THE QUARANTINE INSTRUCTIONS AS OUTLINED IN YOUR HANDOUT.   Call this number if you have problems the morning of surgery [phone]    Remember:. BRUSH YOUR TEETH MORNING OF SURGERY AND RINSE YOUR MOUTH OUT, NO CHEWING GUM CANDY OR MINTS.   NO SOLID FOOD AFTER MIDNIGHT THE NIGHT PRIOR TO SURGERY. NOTHING BY MOUTH EXCEPT CLEAR LIQUIDS UNTIL 430 AM.  PLEASE FINISH ENSURE DRINK PER SURGEON ORDER WHICH NEEDS TO BE COMPLETED AT 430 AM..    CLEAR LIQUID DIET   Foods Allowed                                                                     Foods Excluded  Coffee and tea, regular and decaf                             liquids that you cannot  Plain Jell-O in any flavor                                             see through such as: Fruit ices (not with fruit pulp)                                     milk, soups, orange juice  Iced Popsicles                                    All solid food Carbonated beverages, regular and diet                                    Cranberry, grape and apple juices Sports drinks like Gatorade Lightly seasoned clear broth or consume(fat free) Sugar, honey syrup  Sample Menu Breakfast                                Lunch                                     Supper Cranberry juice                    Beef broth  Chicken broth Jell-O                                     Grape juice                           Apple juice Coffee or tea                        Jell-O                                      Popsicle                                                Coffee or tea                         Coffee or tea  _____________________________________________________________________    Take these medicines the morning of surgery with A SIP OF WATER:  ATENOLOL, BUSPIRONE (BUSPAR), DILTIAZEM (TIAZAC), DULOXETINE (CYMBALTA), LORATADINE (CLARITIN). YOU MAY ALSO USE AND BRING YOUR  FLONASE NASAL SPRAY,                                You may not have any metal on your body including hair pins and              piercings     Do not wear jewelry, make-up, lotions, powders or perfumes, deodorant               Do not wear nail polish.  Do not shave  48 hours prior to surgery.       Do not bring valuables to the hospital. [place].  Contacts, dentures or bridgework may not be worn into surgery.      _____________________________________________________________________             [facility] - Preparing for Surgery Before surgery, you can play an important role.  Because skin is not sterile, your skin needs to be as free of germs as possible.  You can reduce the number of germs on your skin by washing with CHG (chlorahexidine gluconate) soap before surgery.  CHG is an antiseptic cleaner which kills germs and bonds with the skin to continue killing germs even after washing. Please DO NOT use if you have an allergy to CHG or antibacterial soaps.  If your skin becomes reddened/irritated stop using the CHG and inform your nurse when you arrive at Short Stay. Do not shave (including legs and underarms) for at least 48 hours prior to the first CHG shower.  You may shave your face/neck. Please follow these instructions carefully:  1.  Shower with CHG Soap the night before surgery and the  morning of Surgery.  2.  If you choose to wash your hair, wash your hair first as usual with your  normal  shampoo.  3.  After you shampoo, rinse your hair and body thoroughly to  remove the  shampoo.                           4.  Use CHG as you would any  other liquid soap.  You can apply chg directly  to the skin and wash                       Gently with a scrungie or clean washcloth.  5.  Apply the CHG Soap to your body ONLY FROM THE NECK DOWN.   Do not use on face/ open                           Wound or open sores. Avoid contact with eyes, ears mouth and genitals (private parts).                       Wash face,  Genitals (private parts) with your normal soap.             6.  Wash thoroughly, paying special attention to the area where your surgery  will be performed.  7.  Thoroughly rinse your body with warm water from the neck down.  8.  DO NOT shower/wash with your normal soap after using and rinsing off  the CHG Soap.                9.  Pat yourself dry with a clean towel.            10.  Wear clean pajamas.            11.  Place clean sheets on your bed the night of your first shower and do not  sleep with pets. Day of Surgery : Do not apply any lotions/deodorants the morning of surgery.  Please wear clean clothes to the hospital/surgery center.  FAILURE TO FOLLOW THESE INSTRUCTIONS MAY RESULT IN THE CANCELLATION OF YOUR SURGERY PATIENT SIGNATURE_________________________________  NURSE SIGNATURE__________________________________  ________________________________________________________________________   Adam Phenix  An incentive spirometer is a tool that can help keep your lungs clear and active. This tool measures how well you are filling your lungs with each breath. Taking long deep breaths may help reverse or decrease the chance of developing breathing (pulmonary) problems (especially infection) following:  A long period of time when you are unable to move or be active. BEFORE THE PROCEDURE   If the spirometer includes an indicator to show your best effort, your nurse or respiratory therapist will set it to a desired goal.  If possible, sit up straight or lean slightly forward. Try not to slouch.  Hold the  incentive spirometer in an upright position. INSTRUCTIONS FOR USE  1. Sit on the edge of your bed if possible, or sit up as far as you can in bed or on a chair. 2. Hold the incentive spirometer in an upright position. 3. Breathe out normally. 4. Place the mouthpiece in your mouth and seal your lips tightly around it. 5. Breathe in slowly and as deeply as possible, raising the piston or the ball toward the top of the column. 6. Hold your breath for 3-5 seconds or for as long as possible. Allow the piston or ball to fall to the bottom of the column. 7. Remove the mouthpiece from your mouth and breathe out normally. 8. Rest for a few seconds and repeat Steps 1 through 7 at  least 10 times every 1-2 hours when you are awake. Take your time and take a few normal breaths between deep breaths. 9. The spirometer may include an indicator to show your best effort. Use the indicator as a goal to work toward during each repetition. 10. After each set of 10 deep breaths, practice coughing to be sure your lungs are clear. If you have an incision (the cut made at the time of surgery), support your incision when coughing by placing a pillow or rolled up towels firmly against it. Once you are able to get out of bed, walk around indoors and cough well. You may stop using the incentive spirometer when instructed by your caregiver.  RISKS AND COMPLICATIONS  Take your time so you do not get dizzy or light-headed.  If you are in pain, you may need to take or ask for pain medication before doing incentive spirometry. It is harder to take a deep breath if you are having pain. AFTER USE  Rest and breathe slowly and easily.  It can be helpful to keep track of a log of your progress. Your caregiver can provide you with a simple table to help with this. If you are using the spirometer at home, follow these instructions: [place] IF:   You are having difficultly using the spirometer.  You have trouble using  the spirometer as often as instructed.  Your pain medication is not giving enough relief while using the spirometer.  You develop fever of 100.5 F (38.1 C) or higher. SEEK IMMEDIATE MEDICAL CARE IF:   You cough up bloody sputum that had not been present before.  You develop fever of 102 F (38.9 C) or greater.  You develop worsening pain at or near the incision site. MAKE SURE YOU:   Understand these instructions.  Will watch your condition.  Will get help right away if you are not doing well or get worse. Document Released: 06/24/2006 Document Revised: 05/06/2011 Document Reviewed: 08/25/2006 Exit[employer] Patient Information 2014 Exit[employer], [place].   ________________________________________________________________________  WHAT IS A BLOOD TRANSFUSION? Blood Transfusion Information  A transfusion is the replacement of blood or some of its parts. Blood is made up of multiple cells which provide different functions.  Red blood cells carry oxygen and are used for blood loss replacement.  White blood cells fight against infection.  Platelets control bleeding.  Plasma helps clot blood.  Other blood products are available for specialized needs, such as hemophilia or other clotting disorders. BEFORE THE TRANSFUSION  Who gives blood for transfusions?   Healthy volunteers who are fully evaluated to make sure their blood is safe. This is blood bank blood. Transfusion therapy is the safest it has ever been in the practice of medicine. Before blood is taken from a donor, a complete history is taken to make sure that person has no history of diseases nor engages in risky social behavior (examples are intravenous drug use or sexual activity with multiple partners). The donor's travel history is screened to minimize risk of transmitting infections, such as malaria. The donated blood is tested for signs of infectious diseases, such as HIV and hepatitis. The blood is then tested to be sure it  is compatible with you in order to minimize the chance of a transfusion reaction. If you or a relative donates blood, this is often done in anticipation of surgery and is not appropriate for emergency situations. It takes many days to process the donated blood. RISKS AND COMPLICATIONS Although transfusion  therapy is very safe and saves many lives, the main dangers of transfusion include:   Getting an infectious disease.  Developing a transfusion reaction. This is an allergic reaction to something in the blood you were given. Every precaution is taken to prevent this. The decision to have a blood transfusion has been considered carefully by your caregiver before blood is given. Blood is not given unless the benefits outweigh the risks. AFTER THE TRANSFUSION  Right after receiving a blood transfusion, you will usually feel much better and more energetic. This is especially true if your red blood cells have gotten low (anemic). The transfusion raises the level of the red blood cells which carry oxygen, and this usually causes an energy increase.  The nurse administering the transfusion will monitor you carefully for complications. HOME CARE INSTRUCTIONS  No special instructions are needed after a transfusion. You may find your energy is better. Speak with your caregiver about any limitations on activity for underlying diseases you may have. SEEK MEDICAL CARE IF:   Your condition is not improving after your transfusion.  You develop redness or irritation at the intravenous (IV) site. SEEK IMMEDIATE MEDICAL CARE IF:  Any of the following symptoms occur over the next 12 hours:  Shaking chills.  You have a temperature by mouth above 102 F (38.9 C), not controlled by medicine.  Chest, back, or muscle pain.  People around you feel you are not acting correctly or are confused.  Shortness of breath or difficulty breathing.  Dizziness and fainting.  You get a rash or develop hives.  You  have a decrease in urine output.  Your urine turns a dark color or changes to pink, red, or brown. Any of the following symptoms occur over the next 10 days:  You have a temperature by mouth above 102 F (38.9 C), not controlled by medicine.  Shortness of breath.  Weakness after normal activity.  The white part of the eye turns yellow (jaundice).  You have a decrease in the amount of urine or are urinating less often.  Your urine turns a dark color or changes to pink, red, or brown. Document Released: 02/09/2000 Document Revised: 05/06/2011 Document Reviewed: 09/28/2007 [facility] Patient Information 2014 [place], [place].  _______________________________________________________________________

## 2018-08-05 ENCOUNTER — Encounter (HOSPITAL_COMMUNITY)
Admission: RE | Admit: 2018-08-05 | Discharge: 2018-08-05 | Disposition: A | Discharge status: Home / Self Care | Payer: Medicare Other | Source: Ambulatory Visit | Attending: Orthopedic Surgery | Admitting: Orthopedic Surgery

## 2018-08-05 ENCOUNTER — Encounter (HOSPITAL_COMMUNITY): Payer: Self-pay | Admitting: Physician Assistant

## 2018-08-05 ENCOUNTER — Other Ambulatory Visit: Payer: Self-pay

## 2018-08-05 ENCOUNTER — Encounter (HOSPITAL_COMMUNITY): Payer: Self-pay

## 2018-08-05 DIAGNOSIS — Z01812 Encounter for preprocedural laboratory examination: Secondary | ICD-10-CM | POA: Insufficient documentation

## 2018-08-05 DIAGNOSIS — M1611 Unilateral primary osteoarthritis, right hip: Secondary | ICD-10-CM | POA: Diagnosis not present

## 2018-08-05 LAB — CBC
HCT: 44.2 % (ref 36.0–46.0)
Hemoglobin: 14 g/dL (ref 12.0–15.0)
MCH: 29.5 pg (ref 26.0–34.0)
MCHC: 31.7 g/dL (ref 30.0–36.0)
MCV: 93.1 fL (ref 80.0–100.0)
Platelets: 233 10*3/uL (ref 150–400)
RBC: 4.75 MIL/uL (ref 3.87–5.11)
RDW: 11.8 % (ref 11.5–15.5)
WBC: 13 10*3/uL — ABNORMAL HIGH (ref 4.0–10.5)
nRBC: 0 % (ref 0.0–0.2)

## 2018-08-05 LAB — HEMOGLOBIN A1C
Hgb A1c MFr Bld: 5.3 % (ref 4.8–5.6)
Mean Plasma Glucose: 105.41 mg/dL

## 2018-08-05 LAB — BASIC METABOLIC PANEL
Anion gap: 8 (ref 5–15)
BUN: 7 mg/dL — ABNORMAL LOW (ref 8–23)
CO2: 26 mmol/L (ref 22–32)
Calcium: 9 mg/dL (ref 8.9–10.3)
Chloride: 104 mmol/L (ref 98–111)
Creatinine, Ser: 0.59 mg/dL (ref 0.44–1.00)
GFR calc Af Amer: >60 mL/min (ref >60)
GFR calc non Af Amer: >60 mL/min (ref >60)
Glucose, Bld: 105 mg/dL — ABNORMAL HIGH (ref 70–99)
Potassium: 5 mmol/L (ref 3.5–5.1)
Sodium: 138 mmol/L (ref 135–145)

## 2018-08-05 LAB — SURGICAL PCR SCREEN
MRSA, PCR: NEGATIVE
Staphylococcus aureus: NEGATIVE

## 2018-08-05 LAB — ABO/RH: ABO/RH(D): O NEG

## 2018-08-05 NOTE — Progress Notes (Signed)
Pt presented to PAT appointment with mild confusion, and lethargy. Pt stated that she had taken 1/2 Xanax, 1 Norco, and 1 Flexeril recently. She also noted that she had taken her   Trazodone later than usual during the night. Pt also hadn't not eaten.   Glo Herring, PA notified and patient assessed. Pt not allowed to sign surgical consent forms due to lethargy, and advised to be careful in regards to the use of pain medication on day of surgery. PAT appointment and instructions discussed via telephone conference with daughter, Carley Hammed. Pt escorted out of building in wheelchair, and escorted to daughter's car at conclusion of PAT and lab appointments.

## 2018-08-07 ENCOUNTER — Other Ambulatory Visit (HOSPITAL_COMMUNITY): Payer: Medicare Other

## 2018-08-11 ENCOUNTER — Inpatient Hospital Stay (HOSPITAL_COMMUNITY): Admission: RE | Admit: 2018-08-11 | Payer: Medicare Other | Source: Home / Self Care | Admitting: Orthopedic Surgery

## 2018-08-11 ENCOUNTER — Encounter (HOSPITAL_COMMUNITY): Admission: RE | Payer: Self-pay | Source: Home / Self Care

## 2018-08-11 LAB — TYPE AND SCREEN
ABO/RH(D): O NEG
Antibody Screen: NEGATIVE

## 2018-08-11 SURGERY — ARTHROPLASTY, HIP, TOTAL, ANTERIOR APPROACH
Anesthesia: Spinal | Laterality: Right

## 2018-08-26 ENCOUNTER — Other Ambulatory Visit: Payer: Self-pay | Admitting: Family

## 2018-08-26 DIAGNOSIS — F411 Generalized anxiety disorder: Secondary | ICD-10-CM

## 2018-08-26 NOTE — Telephone Encounter (Signed)
Please advise.  Has an appt 8/18

## 2018-08-27 ENCOUNTER — Other Ambulatory Visit: Payer: Self-pay | Admitting: Family

## 2018-08-27 ENCOUNTER — Telehealth: Payer: Self-pay | Admitting: Family

## 2018-08-27 DIAGNOSIS — K589 Irritable bowel syndrome without diarrhea: Secondary | ICD-10-CM

## 2018-09-18 ENCOUNTER — Telehealth: Payer: Self-pay | Admitting: Family

## 2018-09-18 NOTE — Telephone Encounter (Signed)
Please review and advise.

## 2018-09-18 NOTE — Telephone Encounter (Signed)
PT will need to be seen face to face for home health or a video visit.

## 2018-09-21 NOTE — Telephone Encounter (Signed)
appt made for 10/01/18

## 2018-09-28 ENCOUNTER — Other Ambulatory Visit: Payer: Self-pay | Admitting: Family

## 2018-09-28 DIAGNOSIS — K589 Irritable bowel syndrome without diarrhea: Secondary | ICD-10-CM

## 2018-09-28 DIAGNOSIS — F411 Generalized anxiety disorder: Secondary | ICD-10-CM

## 2018-09-30 ENCOUNTER — Other Ambulatory Visit: Payer: Self-pay

## 2018-10-01 ENCOUNTER — Other Ambulatory Visit: Payer: Self-pay

## 2018-10-01 ENCOUNTER — Encounter: Payer: Self-pay | Admitting: Family

## 2018-10-01 ENCOUNTER — Ambulatory Visit (INDEPENDENT_AMBULATORY_CARE_PROVIDER_SITE_OTHER): Payer: Medicare Other | Admitting: Family

## 2018-10-01 VITALS — BP 164/72 | HR 46 | Temp 96.8°F | Ht 62.0 in | Wt 139.8 lb

## 2018-10-01 DIAGNOSIS — F331 Major depressive disorder, recurrent, moderate: Secondary | ICD-10-CM

## 2018-10-01 DIAGNOSIS — G8929 Other chronic pain: Secondary | ICD-10-CM

## 2018-10-01 DIAGNOSIS — E782 Mixed hyperlipidemia: Secondary | ICD-10-CM

## 2018-10-01 DIAGNOSIS — J301 Allergic rhinitis due to pollen: Secondary | ICD-10-CM

## 2018-10-01 DIAGNOSIS — K219 Gastro-esophageal reflux disease without esophagitis: Secondary | ICD-10-CM | POA: Diagnosis not present

## 2018-10-01 DIAGNOSIS — M545 Low back pain: Secondary | ICD-10-CM

## 2018-10-01 DIAGNOSIS — I159 Secondary hypertension, unspecified: Secondary | ICD-10-CM

## 2018-10-01 DIAGNOSIS — K5900 Constipation, unspecified: Secondary | ICD-10-CM

## 2018-10-01 DIAGNOSIS — F411 Generalized anxiety disorder: Secondary | ICD-10-CM

## 2018-10-01 DIAGNOSIS — F1320 Sedative, hypnotic or anxiolytic dependence, uncomplicated: Secondary | ICD-10-CM

## 2018-10-01 DIAGNOSIS — E663 Overweight: Secondary | ICD-10-CM | POA: Diagnosis not present

## 2018-10-01 DIAGNOSIS — G4700 Insomnia, unspecified: Secondary | ICD-10-CM

## 2018-10-01 DIAGNOSIS — Z79899 Other long term (current) drug therapy: Secondary | ICD-10-CM

## 2018-10-01 DIAGNOSIS — R531 Weakness: Secondary | ICD-10-CM

## 2018-10-01 MED ORDER — ALPRAZOLAM 0.25 MG PO TABS: 0.2500 mg | ORAL_TABLET | Freq: Two times a day (BID) | ORAL | 1 refills | Status: DC | PRN

## 2018-10-01 MED ORDER — LORATADINE 10 MG PO TABS: 10.0000 mg | ORAL_TABLET | Freq: Every day | ORAL | 11 refills | Status: DC

## 2018-10-01 NOTE — Progress Notes (Signed)
Subjective:    Patient ID: Kaitlyn Serrano, female    DOB: June 22, 1940, 78 y.o.   MRN: [id number]  Chief Complaint  Patient presents with  . Medical Management of Chronic Issues   PT presents to the office today for chronic follow up. Pt is slightly confused today. She reports she already had her hip surgery, but can not remember it. According, to Epic and daughter he was brought in for pre-op exam, but never had the surgery.   PT continues to have family drama at home which makes her anxiety worse. Hypertension This is a chronic problem. The current episode started more than 1 year ago. The problem has been resolved since onset. The problem is controlled. Associated symptoms include anxiety and malaise/fatigue. Pertinent negatives include no peripheral edema or shortness of breath. Risk factors for coronary artery disease include dyslipidemia, obesity and sedentary lifestyle. The current treatment provides moderate improvement. There is no history of CAD/MI or heart failure.  Gastroesophageal Reflux She complains of belching and heartburn. This is a chronic problem. The current episode started more than 1 year ago. The problem occurs occasionally. The problem has been waxing and waning. Associated symptoms include fatigue. She has tried a PPI for the symptoms. The treatment provided moderate relief.  Depression        This is a chronic problem.  The current episode started more than 1 year ago.   The problem occurs intermittently.  The problem has been waxing and waning since onset.  Associated symptoms include fatigue, irritable and restlessness.  Associated symptoms include no hopelessness.  Compliance with treatment is good.  Past medical history includes anxiety.   Anxiety Presents for follow-up visit. Symptoms include depressed mood, excessive worry, irritability, nervous/anxious behavior, panic and restlessness. Patient reports no shortness of breath. Symptoms occur most days. The severity  of symptoms is moderate. The quality of sleep is good.    Hyperlipidemia This is a chronic problem. Pertinent negatives include no shortness of breath.  Constipation This is a chronic problem. The current episode started more than 1 year ago. The problem has been waxing and waning since onset. Associated symptoms include back pain. She has tried laxatives for the symptoms. The treatment provided moderate relief.  Back Pain This is a chronic problem. The current episode started more than 1 year ago. The problem occurs intermittently. The problem has been waxing and waning since onset. The pain is present in the lumbar spine. The quality of the pain is described as aching. The pain is at a severity of 6/10. The pain is moderate. She has tried NSAIDs, muscle relaxant and analgesics for the symptoms. The treatment provided mild relief.      Review of Systems  Constitutional: Positive for fatigue, irritability and malaise/fatigue.  Respiratory: Negative for shortness of breath.   Gastrointestinal: Positive for constipation and heartburn.  Musculoskeletal: Positive for back pain.  Psychiatric/Behavioral: Positive for depression. The patient is nervous/anxious.   All other systems reviewed and are negative.      Objective:   Physical Exam Vitals signs reviewed.  Constitutional:      General: She is irritable. She is not in acute distress.    Appearance: She is well-developed.  HENT:     Head: Normocephalic and atraumatic.     Right Ear: Tympanic membrane normal.     Left Ear: Tympanic membrane normal.  Eyes:     Pupils: Pupils are equal, round, and reactive to light.  Neck:  Musculoskeletal: Normal range of motion and neck supple.     Thyroid: No thyromegaly.  Cardiovascular:     Rate and Rhythm: Normal rate and regular rhythm.     Heart sounds: Normal heart sounds. No murmur.  Pulmonary:     Effort: Pulmonary effort is normal. No respiratory distress.     Breath sounds: Normal  breath sounds. No wheezing.  Abdominal:     General: Bowel sounds are normal. There is no distension.     Palpations: Abdomen is soft.     Tenderness: There is no abdominal tenderness.  Musculoskeletal: Normal range of motion.        General: No tenderness.  Skin:    General: Skin is warm and dry.  Neurological:     Mental Status: She is alert and oriented to person, place, and time.     Cranial Nerves: No cranial nerve deficit.     Deep Tendon Reflexes: Reflexes are normal and symmetric.  Psychiatric:        Mood and Affect: Mood is anxious.        Behavior: Behavior normal.        Thought Content: Thought content normal.        Judgment: Judgment normal.     BP (!) 164/72   Pulse (!) 46   Temp (!) 96.8 F (36 C) (Oral)   Ht _0  (1.575 m)   Wt 139 lb 12.8 oz (63.4 kg)   BMI 25.57 kg/m      Assessment & Plan:  BRIGIT DOKE comes in today with chief complaint of Medical Management of Chronic Issues   Diagnosis and orders addressed:  1. Secondary hypertension - Ambulatory referral to [facility] Care Management Services - CMP14+EGFR - CBC with Differential/Platelet  2. Gastroesophageal reflux disease without esophagitis - Ambulatory referral to [facility] Care Management Services - CMP14+EGFR - CBC with Differential/Platelet  3. Overweight (BMI 25.0-29.9) - Ambulatory referral to [facility] Care Management Services - CMP14+EGFR - CBC with Differential/Platelet  4. Constipation, unspecified constipation type - Ambulatory referral to [facility] Care Management Services - CMP14+EGFR - CBC with Differential/Platelet  5. GAD (generalized anxiety disorder) - Ambulatory referral to Chronic Care Management Services - ALPRAZolam (XANAX) 0.25 MG tablet; Take 1 tablet (0.25 mg total) by mouth 2 (two) times daily as needed for anxiety.  Dispense: 45 tablet; Refill: 1 - CMP14+EGFR - CBC with Differential/Platelet  6. Controlled substance agreement signed - Ambulatory referral  to [facility] Care Management Services - ALPRAZolam (XANAX) 0.25 MG tablet; Take 1 tablet (0.25 mg total) by mouth 2 (two) times daily as needed for anxiety.  Dispense: 45 tablet; Refill: 1 - CMP14+EGFR - CBC with Differential/Platelet  7. Mixed hyperlipidemia  - Ambulatory referral to [facility] Care Management Services - CMP14+EGFR - CBC with Differential/Platelet  8. Insomnia, unspecified type  - Ambulatory referral to [facility] Care Management Services - CMP14+EGFR - CBC with Differential/Platelet  9. Moderate episode of recurrent major depressive disorder ([place])  - Ambulatory referral to Chronic Care Management Services - CMP14+EGFR - CBC with Differential/Platelet  10. Seasonal allergic rhinitis due to pollen  - Ambulatory referral to Chronic Care Management Services - CMP14+EGFR - CBC with Differential/Platelet  11. Benzodiazepine dependence ([place]) - we will decrease Xanax to 0.25 mg from 0.5 mg.  - Ambulatory referral to [facility] Care Management Services - CMP14+EGFR - CBC with Differential/Platelet  12. Weakness  - Ambulatory referral to [place] - Ambulatory referral to Chronic Care Management Services - CMP14+EGFR - CBC  with Differential/Platelet  13. Chronic midline low back pain, unspecified whether sciatica present  - Ambulatory referral to [place] referral to Chronic Care Management Services - CMP14+EGFR - CBC with Differential/Platelet   Labs pending Will do referral to CCM to help GAD  Health Maintenance reviewed Diet and exercise encouraged  Follow up plan: 3 months    Evelina Dun, FNP

## 2018-10-01 NOTE — Patient Instructions (Signed)

## 2018-10-02 ENCOUNTER — Telehealth: Payer: Self-pay | Admitting: Family

## 2018-10-02 LAB — CBC WITH DIFFERENTIAL/PLATELET
Basophils Absolute: 0.1 10*3/uL (ref 0.0–0.2)
Basos: 1 %
EOS (ABSOLUTE): 0.1 10*3/uL (ref 0.0–0.4)
Eos: 1 %
Hematocrit: 44.8 % (ref 34.0–46.6)
Hemoglobin: 14.9 g/dL (ref 11.1–15.9)
Immature Grans (Abs): 0.1 10*3/uL (ref 0.0–0.1)
Immature Granulocytes: 1 %
Lymphocytes Absolute: 1.9 10*3/uL (ref 0.7–3.1)
Lymphs: 13 %
MCH: 29.9 pg (ref 26.6–33.0)
MCHC: 33.3 g/dL (ref 31.5–35.7)
MCV: 90 fL (ref 79–97)
Monocytes Absolute: 1.8 10*3/uL — ABNORMAL HIGH (ref 0.1–0.9)
Monocytes: 13 %
Neutrophils Absolute: 10.1 10*3/uL — ABNORMAL HIGH (ref 1.4–7.0)
Neutrophils: 71 %
Platelets: 307 10*3/uL (ref 150–450)
RBC: 4.99 x10E6/uL (ref 3.77–5.28)
RDW: 13.3 % (ref 11.7–15.4)
WBC: 14.1 10*3/uL — ABNORMAL HIGH (ref 3.4–10.8)

## 2018-10-02 LAB — CMP14+EGFR
ALT: 12 IU/L (ref 0–32)
AST: 18 IU/L (ref 0–40)
Albumin/Globulin Ratio: 1.8 (ref 1.2–2.2)
Albumin: 4.3 g/dL (ref 3.7–4.7)
Alkaline Phosphatase: 102 IU/L (ref 39–117)
BUN/Creatinine Ratio: 20 (ref 12–28)
BUN: 13 mg/dL (ref 8–27)
Bilirubin Total: 0.5 mg/dL (ref 0.0–1.2)
CO2: 23 mmol/L (ref 20–29)
Calcium: 9.4 mg/dL (ref 8.7–10.3)
Chloride: 95 mmol/L — ABNORMAL LOW (ref 96–106)
Creatinine, Ser: 0.66 mg/dL (ref 0.57–1.00)
GFR calc Af Amer: 98 mL/min/{1.73_m2} (ref >59)
GFR calc non Af Amer: 85 mL/min/{1.73_m2} (ref >59)
Globulin, Total: 2.4 g/dL (ref 1.5–4.5)
Glucose: 82 mg/dL (ref 65–99)
Potassium: 4.7 mmol/L (ref 3.5–5.2)
Sodium: 134 mmol/L (ref 134–144)
Total Protein: 6.7 g/dL (ref 6.0–8.5)

## 2018-10-02 NOTE — Telephone Encounter (Signed)
Aware of results per result note  

## 2018-10-05 ENCOUNTER — Other Ambulatory Visit: Payer: Self-pay | Admitting: *Deleted

## 2018-10-05 DIAGNOSIS — D72829 Elevated white blood cell count, unspecified: Secondary | ICD-10-CM

## 2018-10-07 ENCOUNTER — Telehealth: Payer: Self-pay | Admitting: *Deleted

## 2018-10-07 NOTE — Telephone Encounter (Signed)
FYI VM from Amy PT w/ [facility] She arrived at pt's home today, daughter her meet her at the door, refusing services.  VM from daughter, doesn't want PT, she gets at home, walks with walker, and takes dog for walks

## 2018-10-13 ENCOUNTER — Ambulatory Visit: Payer: Medicare Other | Admitting: Family

## 2018-10-14 ENCOUNTER — Ambulatory Visit (INDEPENDENT_AMBULATORY_CARE_PROVIDER_SITE_OTHER): Payer: Medicare Other | Admitting: Physician Assistant

## 2018-10-14 DIAGNOSIS — L03113 Cellulitis of right upper limb: Secondary | ICD-10-CM

## 2018-10-14 DIAGNOSIS — W548XXA Other contact with dog, initial encounter: Secondary | ICD-10-CM

## 2018-10-14 DIAGNOSIS — H9203 Otalgia, bilateral: Secondary | ICD-10-CM | POA: Diagnosis not present

## 2018-10-14 DIAGNOSIS — M25559 Pain in unspecified hip: Secondary | ICD-10-CM

## 2018-10-14 MED ORDER — CIPROFLOXACIN HCL 250 MG PO TABS: 250.0000 mg | ORAL_TABLET | Freq: Two times a day (BID) | ORAL | 0 refills | Status: DC

## 2018-10-15 ENCOUNTER — Encounter: Payer: Self-pay | Admitting: Physician Assistant

## 2018-10-15 NOTE — Progress Notes (Signed)
Telephone visit  Subjective: CC:uri, dog scratch PCP: Sharion Balloon, FNP [id number] T Kaitlyn Serrano is a 78 y.o. female calls for telephone consult today. Patient provides verbal consent for consult held via phone.  Patient is identified with 2 separate identifiers.  At this time the entire area is on COVID-19 social distancing and stay home orders are in place.  Patient is of higher risk and therefore we are performing this by a virtual method.  Location of patient: home Location of provider: [facility] Others present for call: no  This patient has had many days of sore throat and postnasal drainage, headache at times and sinus pressure. There is copious drainage at times. Denies any fever at this time. There has been a history of sinus infections in the past.  There is cough at night. It has become more prevalent in recent days.  She had a dog scratch her arm a few days ago and there has been some redness around the scratch.  There has been no significant amount of pus.  There is no warmth at this time.  Patient also deals with a lot of arthritis pain.   ROS: Per HPI  Allergies  Allergen Reactions  . Bee Venom Anaphylaxis  . Amlodipine Swelling  . Elavil [Amitriptyline Hcl] Other (See Comments)    confusion  . Statins Swelling and Other (See Comments)    Peeling of skin  . Penicillins Rash    Has patient had a PCN reaction causing immediate rash, facial/tongue/throat swelling, SOB or lightheadedness with hypotension: Yes Has patient had a PCN reaction causing severe rash involving mucus membranes or skin necrosis: Yes Has patient had a PCN reaction that required hospitalization: Yes Has patient had a PCN reaction occurring within the last 10 years: No If all of the above answers are "NO", then may proceed with Cephalosporin use.    Past Medical History:  Diagnosis Date  . Asthma   . Blood transfusion   . Blurred vision   . CAD (coronary artery disease)   . Chest pain   .  CHF (congestive heart failure) ([place])   . Chronic pain syndrome   . Cough   . Cyanosis   . Diabetes ([place])   . Diverticulosis   . Double vision   . 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 Dysphagia   . Dyspnea   . Exposure to TB   . Facial pain   . Generalized weakness   . GERD (gastroesophageal reflux disease)   . GERD (gastroesophageal reflux disease)   . Glaucoma   . Hearing loss   . Heart murmur   . Hemorrhoids   . Hemorrhoids   . High cholesterol   . Hoarseness   . Hx of cardiovascular stress test    negative 2012 and 2016  . Hypertension   . IBS (irritable bowel syndrome)   . Insomnia   . Intervertebral disc disorder with myelopathy, lumbar region   . Irregular heart beat   . Leg swelling   . Lumbago   . Lumbar spine pain   . MI (myocardial infarction) ([place])   . Nasal congestion   . Night sweats   . Palpitations   . Rectal bleeding   . Renal insufficiency   . Ringing in ears   . Sinus infection   . Spondylosis with myelopathy, lumbar region   . Syncope   . Valvular heart disease   . Vertigo   . Vision loss   .  Visual disorder   . Wheezing     Current Outpatient Medications:  .  ALPRAZolam (XANAX) 0.25 MG tablet, Take 1 tablet (0.25 mg total) by mouth 2 (two) times daily as needed for anxiety., Disp: 45 tablet, Rfl: 1 .  atenolol (TENORMIN) 25 MG tablet, Take 1 tablet (25 mg total) by mouth daily., Disp: 90 tablet, Rfl: 1 .  busPIRone (BUSPAR) 7.5 MG tablet, TAKE (1) TABLET THREE TIMES DAILY., Disp: 90 tablet, Rfl: 0 .  ciprofloxacin (CIPRO) 250 MG tablet, Take 1 tablet (250 mg total) by mouth 2 (two) times daily., Disp: 20 tablet, Rfl: 0 .  dicyclomine (BENTYL) 10 MG capsule, TAKE  (1)  CAPSULE  TWICE DAILY (TAKE ON AN EMPTY STOMACH AT LEAST 30MIN- UTES BEFORE MEALS)., Disp: 60 capsule, Rfl: 0 .  diltiazem (TIAZAC) 120 MG 24 hr capsule, TAKE (1) CAPSULE DAILY (Patient taking differently: Take 120 mg by mouth daily. ), Disp: 90 capsule, Rfl: 0 .  DULoxetine  (CYMBALTA) 30 MG capsule, TAKE (1) CAPSULE DAILY (Patient taking differently: Take 30 mg by mouth daily. ), Disp: 90 capsule, Rfl: 0 .  EPINEPHrine 0.3 mg/0.3 mL IJ SOAJ injection, Inject 0.3 mLs (0.3 mg total) into the muscle once. (Patient not taking: Reported on 10/01/2018), Disp: 2 Device, Rfl: 1 .  Estradiol Acetate (FEMRING) 0.05 MG/24HR RING, INSERT (1) INTO THE VAGINA AS DIR- ECTED. -FOR VAGINAL USE- (Patient taking differently: Place 1 each vaginally every 3 (three) months. ), Disp: 1 each, Rfl: 4 .  fluticasone (FLONASE) 50 MCG/ACT nasal spray, Place 2 sprays into both nostrils daily. (Patient taking differently: Place 2 sprays into both nostrils daily as needed for allergies. ), Disp: 16 g, Rfl: 6 .  furosemide (LASIX) 20 MG tablet, TAKE 1 TABLET DAILY, Disp: 30 tablet, Rfl: 3 .  lisinopril (PRINIVIL,ZESTRIL) 10 MG tablet, TAKE 1 TABLET DAILY (Patient taking differently: Take 10 mg by mouth daily. ), Disp: 90 tablet, Rfl: 1 .  loratadine (CLARITIN) 10 MG tablet, Take 1 tablet (10 mg total) by mouth daily., Disp: 30 tablet, Rfl: 11 .  polyethylene glycol powder (GLYCOLAX/MIRALAX) 17 GM/SCOOP powder, Take 17 g by mouth 2 (two) times daily as needed. (Patient taking differently: Take 17 g by mouth daily as needed for moderate constipation. ), Disp: 3162 g, Rfl: 0 .  traZODone (DESYREL) 50 MG tablet, Take 1 tablet (50 mg total) by mouth at bedtime., Disp: 90 tablet, Rfl: 1  Assessment/ Plan: 78 y.o. female   1. Otalgia of both ears - ciprofloxacin (CIPRO) 250 MG tablet; Take 1 tablet (250 mg total) by mouth 2 (two) times daily.  Dispense: 20 tablet; Refill: 0  2. Dog scratch - ciprofloxacin (CIPRO) 250 MG tablet; Take 1 tablet (250 mg total) by mouth 2 (two) times daily.  Dispense: 20 tablet; Refill: 0  3. Cellulitis of right upper extremity - ciprofloxacin (CIPRO) 250 MG tablet; Take 1 tablet (250 mg total) by mouth 2 (two) times daily.  Dispense: 20 tablet; Refill: 0  4. Arthralgia of  hip, unspecified laterality   No follow-ups on file.  Continue all other maintenance medications as listed above.  Start time: 3:48 PM End time: 3:56 PM  Meds ordered this encounter  Medications  . ciprofloxacin (CIPRO) 250 MG tablet    Sig: Take 1 tablet (250 mg total) by mouth 2 (two) times daily.    Dispense:  20 tablet    Refill:  0    Order Specific Question:   Supervising Provider  Answer:   Janora Norlander [[id number]]    Particia Nearing PA-C [place] [phone]

## 2018-10-20 ENCOUNTER — Ambulatory Visit: Payer: Medicare Other | Admitting: Family

## 2018-10-21 ENCOUNTER — Ambulatory Visit: Payer: Medicare Other | Admitting: *Deleted

## 2018-10-21 DIAGNOSIS — F411 Generalized anxiety disorder: Secondary | ICD-10-CM

## 2018-10-21 DIAGNOSIS — M1611 Unilateral primary osteoarthritis, right hip: Secondary | ICD-10-CM

## 2018-10-21 DIAGNOSIS — R531 Weakness: Secondary | ICD-10-CM

## 2018-10-21 NOTE — Chronic Care Management (AMB) (Addendum)
  Chronic Care Management   Outreach Note  10/21/2018 Name: Kaitlyn Serrano MRN: [id number] DOB: 1940-08-19  Referred by: Sharion Balloon, FNP Reason for referral : Chronic Care Management (RNCM Initial Visit)   An unsuccessful telephone outreach was attempted today. The patient was referred to the case management team by for assistance with chronic care management and care coordination. Ms 's chart was reviewed prior to outreach attempt. She has multiple chronic medical conditions and would likely benefit from chronic care management services. Home Health PT was ordered to help with gait training but were documented as refused by her daughter. She was scheduled for hip replacement surgery with Dr Alvan Dame but she no showed for pre-op testing and cancelled the surgery. I spoke with [facility] and they have documented that the patient cancelled the surgery for June 2020 and would call back to reschedule but has not done so.   During referral review I did not see a need for LCSW at that time but with further chart review is seems that patient would likely benefit from speaking with LCSW about anxiety.  Follow Up Plan: The care management team will reach out to the patient again over the next 14 days.     Chong Sicilian BSN, RN-BC Embedded Chronic Care Manager [facility] [place] Management Direct Dial: [phone]    I have reviewed and agree with the above documentation.   Evelina Dun, FNP

## 2018-10-26 ENCOUNTER — Telehealth: Payer: Self-pay | Admitting: Family

## 2018-10-26 NOTE — Telephone Encounter (Signed)
LMTCB and make an appt so we can do MMSE

## 2018-10-27 ENCOUNTER — Telehealth: Payer: Self-pay | Admitting: Family

## 2018-10-27 NOTE — Telephone Encounter (Addendum)
Patient was wanting to see if we could do a house call. Patient advised that we do not make house calls. Appointment given for Sept 8 for televisit.

## 2018-10-28 ENCOUNTER — Other Ambulatory Visit: Payer: Self-pay | Admitting: Family

## 2018-10-28 DIAGNOSIS — I10 Essential (primary) hypertension: Secondary | ICD-10-CM

## 2018-10-28 DIAGNOSIS — K589 Irritable bowel syndrome without diarrhea: Secondary | ICD-10-CM

## 2018-10-28 DIAGNOSIS — F411 Generalized anxiety disorder: Secondary | ICD-10-CM

## 2018-11-03 ENCOUNTER — Encounter: Payer: Self-pay | Admitting: Family

## 2018-11-03 ENCOUNTER — Ambulatory Visit (INDEPENDENT_AMBULATORY_CARE_PROVIDER_SITE_OTHER): Payer: Medicare Other | Admitting: Family

## 2018-11-03 DIAGNOSIS — K5900 Constipation, unspecified: Secondary | ICD-10-CM

## 2018-11-03 DIAGNOSIS — I159 Secondary hypertension, unspecified: Secondary | ICD-10-CM | POA: Diagnosis not present

## 2018-11-03 DIAGNOSIS — F411 Generalized anxiety disorder: Secondary | ICD-10-CM

## 2018-11-03 DIAGNOSIS — F331 Major depressive disorder, recurrent, moderate: Secondary | ICD-10-CM

## 2018-11-03 DIAGNOSIS — E782 Mixed hyperlipidemia: Secondary | ICD-10-CM

## 2018-11-03 DIAGNOSIS — M25551 Pain in right hip: Secondary | ICD-10-CM

## 2018-11-03 DIAGNOSIS — E663 Overweight: Secondary | ICD-10-CM

## 2018-11-03 DIAGNOSIS — K219 Gastro-esophageal reflux disease without esophagitis: Secondary | ICD-10-CM | POA: Diagnosis not present
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
## 2018-11-04 ENCOUNTER — Telehealth: Payer: Self-pay | Admitting: Family

## 2018-11-04 ENCOUNTER — Ambulatory Visit (INDEPENDENT_AMBULATORY_CARE_PROVIDER_SITE_OTHER): Payer: Medicare Other | Admitting: *Deleted

## 2018-11-04 DIAGNOSIS — D72829 Elevated white blood cell count, unspecified: Secondary | ICD-10-CM

## 2018-11-04 DIAGNOSIS — M1611 Unilateral primary osteoarthritis, right hip: Secondary | ICD-10-CM | POA: Diagnosis not present

## 2018-11-04 DIAGNOSIS — F411 Generalized anxiety disorder: Secondary | ICD-10-CM

## 2018-11-04 LAB — CBC WITH DIFFERENTIAL/PLATELET
Basophils Absolute: 0.1 10*3/uL (ref 0.0–0.2)
Basos: 1 %
EOS (ABSOLUTE): 0.1 10*3/uL (ref 0.0–0.4)
Eos: 1 %
Hematocrit: 43.8 % (ref 34.0–46.6)
Hemoglobin: 14.7 g/dL (ref 11.1–15.9)
Immature Grans (Abs): 0.1 10*3/uL (ref 0.0–0.1)
Immature Granulocytes: 1 %
Lymphocytes Absolute: 1.1 10*3/uL (ref 0.7–3.1)
Lymphs: 12 %
MCH: 29.8 pg (ref 26.6–33.0)
MCHC: 33.6 g/dL (ref 31.5–35.7)
MCV: 89 fL (ref 79–97)
Monocytes Absolute: 0.9 10*3/uL (ref 0.1–0.9)
Monocytes: 10 %
Neutrophils Absolute: 7.2 10*3/uL — ABNORMAL HIGH (ref 1.4–7.0)
Neutrophils: 75 %
Platelets: 323 10*3/uL (ref 150–450)
RBC: 4.94 x10E6/uL (ref 3.77–5.28)
RDW: 13.3 % (ref 11.7–15.4)
WBC: 9.4 10*3/uL (ref 3.4–10.8)
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
## 2018-11-04 NOTE — Patient Instructions (Signed)
Visit Information  Goals Addressed            This Visit's Progress     Patient Stated   . "I would like to have my hip replacement scheduled" (pt-stated)       Current Barriers:  . Chronic Disease Management support and education needs related to osteoarthritis and hip pain . Family dynamic stress . Potential cognitive/memory deficits  Nurse Case Manager Clinical Goal(s):  Marland Kitchen Over the next 30 days, patient will be in contact with orthopedic surgeon and will have hip replacement surgery rescheduled  Interventions:  . Chart reviewed. Patient was scheduled for hip surgery with provider at [facility] in June 2020 but the surgery was cancelled by her daughter, [name]. . Discussion of HPI with patient o Has hip pain that is affecting her daily activities and ability to provide care for herself o Taking "pain medicine" when she needs it to control pain o States that she thought she had hip surgery and that her current pain was due to healing. She's since realized that [name] cancelled the surgery and that she also turned away and refused home health services. . Discussed current referral to [facility] [phone] . Consulted with referral coordinator and put [name] in contact with the coordinator . Advised that home health nursing and PT services may be reordered after surgery . Verified that patient has transportation to appointments   Patient Self Care Activities:  . Takes care of her own ADLs and IADLs  Initial goal documentation        The patient verbalized understanding of instructions provided today and declined a print copy of patient instruction materials.   [name] was given information about Chronic Care Management services today including:  1. CCM service includes personalized support from designated clinical staff supervised by her physician, including individualized plan of care and coordination with other care providers 2. 24/7 contact phone numbers for  assistance for urgent and routine care needs. 3. Service will only be billed when office clinical staff spend 20 minutes or more in a month to coordinate care. 4. Only one practitioner may furnish and bill the service in a calendar month. 5. The patient may stop CCM services at any time (effective at the end of the month) by phone call to the office staff. 6. The patient will be responsible for cost sharing (co-pay) of up to 20% of the service fee (after annual deductible is met).  Patient agreed to services and verbal consent obtained.     Chong Sicilian BSN, RN-BC Embedded Chronic Care Manager [facility] Direct Dial: [phone]

## 2018-11-16 ENCOUNTER — Ambulatory Visit: Payer: Medicare Other | Admitting: Family

## 2018-11-16 ENCOUNTER — Encounter: Payer: Self-pay | Admitting: Family

## 2018-11-16 ENCOUNTER — Other Ambulatory Visit: Payer: Self-pay | Admitting: Family

## 2018-11-16 DIAGNOSIS — Z79899 Other long term (current) drug therapy: Secondary | ICD-10-CM

## 2018-11-16 DIAGNOSIS — F411 Generalized anxiety disorder: Secondary | ICD-10-CM

## 2018-11-17 ENCOUNTER — Other Ambulatory Visit: Payer: Self-pay | Admitting: Family

## 2018-11-17 DIAGNOSIS — Z79899 Other long term (current) drug therapy: Secondary | ICD-10-CM

## 2018-11-17 DIAGNOSIS — F411 Generalized anxiety disorder: Secondary | ICD-10-CM

## 2018-11-18 ENCOUNTER — Other Ambulatory Visit: Payer: Self-pay | Admitting: Family

## 2018-11-18 DIAGNOSIS — I10 Essential (primary) hypertension: Secondary | ICD-10-CM

## 2018-11-20 ENCOUNTER — Ambulatory Visit (INDEPENDENT_AMBULATORY_CARE_PROVIDER_SITE_OTHER): Payer: Medicare Other | Admitting: Family Medicine

## 2018-11-20 VITALS — BP 132/60

## 2018-11-20 DIAGNOSIS — H9313 Tinnitus, bilateral: Secondary | ICD-10-CM | POA: Diagnosis not present

## 2018-11-20 NOTE — Patient Instructions (Signed)
Tinnitus Tinnitus refers to hearing a sound when there is no actual source for that sound. This is often described as ringing in the ears. However, people with this condition may hear a variety of noises, in one ear or in both ears. The sounds of tinnitus can be soft, loud, or somewhere in between. Tinnitus can last for a few seconds or can be constant for days. It may go away without treatment and come back at various times. When tinnitus is constant or happens often, it can lead to other problems, such as trouble sleeping and trouble concentrating. Almost everyone experiences tinnitus at some point. Tinnitus that is long-lasting (chronic) or comes back often (recurs) may require medical attention. What are the causes? The cause of tinnitus is often not known. In some cases, it can result from other problems or conditions, including:  Exposure to loud noises from machinery, music, or other sources.  Hearing loss.  Ear or sinus infections.  Earwax buildup.  An object (foreign body) stuck in the ear.  Taking certain medicines.  Drinking alcohol or caffeine.  High blood pressure.  Heart diseases.  Anemia.  Allergies.  Meniere's disease.  Thyroid problems.  Tumors.  A weak, bulging blood vessel (aneurysm) near the ear.  Depression or other mood disorders. What are the signs or symptoms? The main symptom of tinnitus is hearing a sound when there is no source for that sound. It may sound like:  Buzzing.  Roaring.  Ringing.  Blowing air, like the sound heard when you listen to a seashell.  Hissing.  Whistling.  Sizzling.  Humming.  Running water.  A musical note.  Tapping. Symptoms may affect only one ear (unilateral) or both ears (bilateral). How is this diagnosed? Tinnitus is diagnosed based on your symptoms, your medical history, and a physical exam. Your health care provider may do a thorough hearing test (audiologic exam) if your tinnitus:  Is  unilateral.  Causes hearing difficulties.  Lasts 6 months or longer. You may work with a health care provider who specializes in hearing disorders (audiologist). You may be asked questions about your symptoms and how they affect your daily life. You may have other tests done, such as:  CT scan.  MRI.  An imaging test of how blood flows through your blood vessels (angiogram). How is this treated? Treating an underlying medical condition can sometimes make tinnitus go away. If your tinnitus continues, other treatments may include:  Medicines, such as antidepressants or sleeping aids.  Sound generators to mask the tinnitus. These include: ? Tabletop sound machines that play relaxing sounds to help you fall asleep. ? Wearable devices that fit in your ear and play sounds or music. ? Acoustic neural stimulation. This involves using headphones to listen to music that contains an auditory signal. Over time, listening to this signal may change some pathways in your brain and make you less sensitive to tinnitus. This treatment is used for very severe cases when no other treatment is working.  Therapy and counseling to help you manage the stress of living with tinnitus.  Using hearing aids or cochlear implants if your tinnitus is related to hearing loss. Hearing aids are worn in the outer ear. Cochlear implants are surgically placed in the inner ear. Follow these instructions at home: Managing symptoms      When possible, avoid being in loud places and being exposed to loud sounds.  Wear hearing protection, such as earplugs, when you are exposed to loud noises.  Use   a white noise machine, a humidifier, or other devices to mask the sound of tinnitus.  Practice techniques for reducing stress, such as meditation, yoga, or deep breathing. Work with your health care provider if you need help with managing stress.  Sleep with your head slightly raised. This may reduce the impact of tinnitus.  General instructions  Do not use stimulants, such as nicotine, alcohol, or caffeine. Talk with your health care provider about other stimulants to avoid. Stimulants are substances that can make you feel alert and attentive by increasing certain activities in the body (such as heart rate and blood pressure). These substances may make tinnitus worse.  Take over-the-counter and prescription medicines only as told by your health care provider.  Try to get plenty of sleep each night.  Keep all follow-up visits as told by your health care provider. This is important. Contact a health care provider if:  Your tinnitus continues for 3 weeks or longer without stopping.  Your symptoms get worse or do not get better with home care.  You develop tinnitus after a head injury.  You have tinnitus along with any of the following: ? Dizziness. ? Loss of balance. ? Nausea and vomiting. Summary  Tinnitus refers to hearing a sound when there is no actual source for that sound. This is often described as ringing in the ears.  Symptoms may affect only one ear (unilateral) or both ears (bilateral).  Use a white noise machine, a humidifier, or other devices to mask the sound of tinnitus.  Do not use stimulants, such as nicotine, alcohol, or caffeine. Talk with your health care provider about other stimulants to avoid. These substances may make tinnitus worse. This information is not intended to replace advice given to you by your health care provider. Make sure you discuss any questions you have with your health care provider. Document Released: 02/11/2005 Document Revised: 01/24/2017 Document Reviewed: 11/21/2016 Elsevier Patient Education  2020 Elsevier Inc.  

## 2018-11-20 NOTE — Progress Notes (Signed)
Telephone visit  Subjective: CC: ear ringing PCP: Sharion Balloon, FNP [id number] Kaitlyn Serrano is a 78 y.o. female calls for telephone consult today. Patient provides verbal consent for consult held via phone.  Location of patient: home Location of provider: [facility] Others present for call: Terri, daughter  1. Ears ringing Patient reports several month history of bilateral ear ringing.  Denies any balance issues, falls.  Does not report any change in hearing.  No infection.  Denies any drainage, fevers.  She was seeing an ear nose and throat doctor in [place] but notes that they have retired and she has not been able to establish with a new one.  She is asking for referral.  ROS: Per HPI  Allergies  Allergen Reactions  . Bee Venom Anaphylaxis  . Amlodipine Swelling  . Elavil [Amitriptyline Hcl] Other (See Comments)    confusion  . Statins Swelling and Other (See Comments)    Peeling of skin  . Penicillins Rash    Has patient had a PCN reaction causing immediate rash, facial/tongue/throat swelling, SOB or lightheadedness with hypotension: Yes Has patient had a PCN reaction causing severe rash involving mucus membranes or skin necrosis: Yes Has patient had a PCN reaction that required hospitalization: Yes Has patient had a PCN reaction occurring within the last 10 years: No If all of the above answers are "NO", then may proceed with Cephalosporin use.    Past Medical History:  Diagnosis Date  . Asthma   . Blood transfusion   . Blurred vision   . CAD (coronary artery disease)   . Chest pain   . CHF (congestive heart failure) ([place])   . Chronic pain syndrome   . Cough   . Cyanosis   . Diabetes ([place])   . Diverticulosis   . Double vision   . 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 Dysphagia   . Dyspnea   . Exposure to TB   . Facial pain   . Generalized weakness   . GERD (gastroesophageal reflux disease)   . GERD (gastroesophageal reflux disease)   . Glaucoma   . Hearing loss    . Heart murmur   . Hemorrhoids   . Hemorrhoids   . High cholesterol   . Hoarseness   . Hx of cardiovascular stress test    negative 2012 and 2016  . Hypertension   . IBS (irritable bowel syndrome)   . Insomnia   . Intervertebral disc disorder with myelopathy, lumbar region   . Irregular heart beat   . Leg swelling   . Lumbago   . Lumbar spine pain   . MI (myocardial infarction) ([place])   . Nasal congestion   . Night sweats   . Palpitations   . Rectal bleeding   . Renal insufficiency   . Ringing in ears   . Sinus infection   . Spondylosis with myelopathy, lumbar region   . Syncope   . Valvular heart disease   . Vertigo   . Vision loss   . Visual disorder   . Wheezing     Current Outpatient Medications:  .  ALPRAZolam (XANAX) 0.25 MG tablet, Take 1 tablet (0.25 mg total) by mouth at bedtime as needed for anxiety., Disp: 30 tablet, Rfl: 0 .  atenolol (TENORMIN) 25 MG tablet, Take 1 tablet (25 mg total) by mouth daily., Disp: 90 tablet, Rfl: 1 .  busPIRone (BUSPAR) 7.5 MG tablet, TAKE (1) TABLET THREE TIMES DAILY., Disp: 90 tablet, Rfl: 0 .  dicyclomine (BENTYL) 10 MG capsule, TAKE  (1)  CAPSULE  TWICE DAILY (TAKE ON AN EMPTY STOMACH AT LEAST 30 MIN- UTES BEFORE MEALS)., Disp: 60 capsule, Rfl: 0 .  diltiazem (TIAZAC) 120 MG 24 hr capsule, TAKE (1) CAPSULE DAILY, Disp: 90 capsule, Rfl: 0 .  DULoxetine (CYMBALTA) 30 MG capsule, TAKE (1) CAPSULE DAILY, Disp: 90 capsule, Rfl: 0 .  EPINEPHrine 0.3 mg/0.3 mL IJ SOAJ injection, Inject 0.3 mLs (0.3 mg total) into the muscle once. (Patient not taking: Reported on 11/04/2018), Disp: 2 Device, Rfl: 1 .  Estradiol Acetate (FEMRING) 0.05 MG/24HR RING, INSERT (1) INTO THE VAGINA AS DIR- ECTED. -FOR VAGINAL USE- (Patient taking differently: Place 1 each vaginally every 3 (three) months. ), Disp: 1 each, Rfl: 4 .  fluticasone (FLONASE) 50 MCG/ACT nasal spray, Place 2 sprays into both nostrils daily. (Patient taking differently: Place 2 sprays into  both nostrils daily as needed for allergies. ), Disp: 16 g, Rfl: 6 .  furosemide (LASIX) 20 MG tablet, TAKE 1 TABLET DAILY, Disp: 30 tablet, Rfl: 3 .  HYDROcodone-acetaminophen (NORCO) 10-325 MG tablet, , Disp: , Rfl:  .  lisinopril (ZESTRIL) 10 MG tablet, TAKE 1 TABLET DAILY, Disp: 90 tablet, Rfl: 0 .  loratadine (CLARITIN) 10 MG tablet, Take 1 tablet (10 mg total) by mouth daily., Disp: 30 tablet, Rfl: 11 .  polyethylene glycol powder (GLYCOLAX/MIRALAX) 17 GM/SCOOP powder, Take 17 g by mouth 2 (two) times daily as needed. (Patient taking differently: Take 17 g by mouth daily as needed for moderate constipation. ), Disp: 3162 g, Rfl: 0 .  tiZANidine (ZANAFLEX) 2 MG tablet, , Disp: , Rfl:  .  traZODone (DESYREL) 50 MG tablet, Take 1 tablet (50 mg total) by mouth at bedtime., Disp: 90 tablet, Rfl: 1  Assessment/ Plan: 78 y.o. female   1. Tinnitus of both ears Uncertain etiology.  I have placed a referral to ENT for further evaluation and management. - Ambulatory referral to ENT   Start time: 1:38pm End time: 1:43pm  Total time spent on patient care (including telephone call/ virtual visit): 12 minutes  [place], [place] [phone]

## 2018-11-25 ENCOUNTER — Telehealth: Payer: Self-pay | Admitting: Cardiology

## 2018-11-25 ENCOUNTER — Encounter: Payer: Self-pay | Admitting: *Deleted

## 2018-11-25 MED ORDER — LISINOPRIL 20 MG PO TABS: 20.0000 mg | ORAL_TABLET | Freq: Every day | ORAL | 1 refills | Status: DC

## 2018-11-25 NOTE — Telephone Encounter (Signed)
Pt daughter aware and voiced understanding - requested records and updated medication list

## 2018-11-25 NOTE — Telephone Encounter (Signed)
Pt says for the last month BP has been 200s/100s HR 90s - denies any symptoms today but has been dizzy off and on when  Her BP runs high - pt went to [facility] last week for dizziness - reviewed medications in chart - says no changes were made at [facility] and was not not admitted - says [facility] recommended that she see Dr Harl Bowie - denies SOB/swelling/chest pain - has virtual appt with Dr Harl Bowie on 10/16

## 2018-11-25 NOTE — Telephone Encounter (Signed)
Virtual Visit Pre-Appointment Phone Call  "(Name), I am calling you today to discuss your upcoming appointment. We are currently trying to limit exposure to the virus that causes COVID-19 by seeing patients at home rather than in the office."  1. "What is the BEST phone number to call the day of the visit?" - include this in appointment notes  2. Do you have or have access to (through a family member/friend) a smartphone with video capability that we can use for your visit?" a. If yes - list this number in appt notes as cell (if different from BEST phone #) and list the appointment type as a VIDEO visit in appointment notes b. If no - list the appointment type as a PHONE visit in appointment notes  3. Confirm consent - "In the setting of the current Covid19 crisis, you are scheduled for a (phone or video) visit with your provider on (date) at (time).  Just as we do with many in-office visits, in order for you to participate in this visit, we must obtain consent.  If you'd like, I can send this to your mychart (if signed up) or email for you to review.  Otherwise, I can obtain your verbal consent now.  All virtual visits are billed to your insurance company just like a normal visit would be.  By agreeing to a virtual visit, we'd like you to understand that the technology does not allow for your provider to perform an examination, and thus may limit your provider's ability to fully assess your condition. If your provider identifies any concerns that need to be evaluated in person, we will make arrangements to do so.  Finally, though the technology is pretty good, we cannot assure that it will always work on either your or our end, and in the setting of a video visit, we may have to convert it to a phone-only visit.  In either situation, we cannot ensure that we have a secure connection.  Are you willing to proceed?" STAFF: Did the patient verbally acknowledge consent to telehealth visit? Document  YES/NO here: yes  4. Advise patient to be prepared - "Two hours prior to your appointment, go ahead and check your blood pressure, pulse, oxygen saturation, and your weight (if you have the equipment to check those) and write them all down. When your visit starts, your provider will ask you for this information. If you have an Apple Watch or Kardia device, please plan to have heart rate information ready on the day of your appointment. Please have a pen and paper handy nearby the day of the visit as well."  5. Give patient instructions for MyChart download to smartphone OR [email or web address] as below if video visit (depending on what platform provider is using)  6. Inform patient they will receive a phone call 15 minutes prior to their appointment time (may be from unknown caller ID) so they should be prepared to answer    TELEPHONE CALL NOTE  PECOLIA RAITZ has been deemed a candidate for a follow-up tele-health visit to limit community exposure during the Covid-19 pandemic. I spoke with the patient via phone to ensure availability of phone/video source, confirm preferred email & phone number, and discuss instructions and expectations.  I reminded LACANDICE GOGA to be prepared with any vital sign and/or heart rhythm information that could potentially be obtained via home monitoring, at the time of her visit. I reminded JOCABED BOSTON to expect a phone call prior to  her visit.  Weston Anna 11/25/2018 12:42 PM   INSTRUCTIONS FOR DOWNLOADING THE MYCHART APP TO SMARTPHONE  - The patient must first make sure to have activated MyChart and know their login information - If Apple, go to [employer] and type in MyChart in the search bar and download the app. If Android, ask patient to go to [employer] and type in [name] in the search bar and download the app. The app is free but as with any other app downloads, their phone may require them to verify saved payment information or  Apple/Android password.  - The patient will need to then log into the app with their MyChart username and password, and select [facility] as their healthcare provider to link the account. When it is time for your visit, go to the MyChart app, find appointments, and click Begin Video Visit. Be sure to Select Allow for your device to access the Microphone and Camera for your visit. You will then be connected, and your provider will be with you shortly.  **If they have any issues connecting, or need assistance please contact MyChart service desk [phone])**  **If using a computer, in order to ensure the best quality for their visit they will need to use either of the following Internet Browsers: [employer], or Google Chrome**  IF USING DOXIMITY or DOXY.ME - The patient will receive a link just prior to their visit by text.     FULL LENGTH CONSENT FOR TELE-HEALTH VISIT   I hereby voluntarily request, consent and authorize [facility] and its employed or contracted physicians, physician assistants, nurse practitioners or other licensed health care professionals (the Practitioner), to provide me with telemedicine health care services (the Services") as deemed necessary by the treating Practitioner. I acknowledge and consent to receive the Services by the Practitioner via telemedicine. I understand that the telemedicine visit will involve communicating with the Practitioner through live audiovisual communication technology and the disclosure of certain medical information by electronic transmission. I acknowledge that I have been given the opportunity to request an in-person assessment or other available alternative prior to the telemedicine visit and am voluntarily participating in the telemedicine visit.  I understand that I have the right to withhold or withdraw my consent to the use of telemedicine in the course of my care at any time, without affecting my right to future care  or treatment, and that the Practitioner or I may terminate the telemedicine visit at any time. I understand that I have the right to inspect all information obtained and/or recorded in the course of the telemedicine visit and may receive copies of available information for a reasonable fee.  I understand that some of the potential risks of receiving the Services via telemedicine include:   Delay or interruption in medical evaluation due to technological equipment failure or disruption;  Information transmitted may not be sufficient (e.g. poor resolution of images) to allow for appropriate medical decision making by the Practitioner; and/or   In rare instances, security protocols could fail, causing a breach of personal health information.  Furthermore, I acknowledge that it is my responsibility to provide information about my medical history, conditions and care that is complete and accurate to the best of my ability. I acknowledge that Practitioner's advice, recommendations, and/or decision may be based on factors not within their control, such as incomplete or inaccurate data provided by me or distortions of diagnostic images or specimens that may result from electronic transmissions. I  understand that the practice of medicine is not an exact science and that Practitioner makes no warranties or guarantees regarding treatment outcomes. I acknowledge that I will receive a copy of this consent concurrently upon execution via email to the email address I last provided but may also request a printed copy by calling the office of [facility].    I understand that my insurance will be billed for this visit.   I have read or had this consent read to me.  I understand the contents of this consent, which adequately explains the benefits and risks of the Services being provided via telemedicine.   I have been provided ample opportunity to ask questions regarding this consent and the Services and have had  my questions answered to my satisfaction.  I give my informed consent for the services to be provided through the use of telemedicine in my medical care  By participating in this telemedicine visit I agree to the above.

## 2018-11-25 NOTE — Telephone Encounter (Signed)
Having issues with her medications  Was told to contact our office

## 2018-11-25 NOTE — Telephone Encounter (Signed)
Increase lisinopril to 20mg  daily, keep current f/u. [place] records please   Zandra Abts MD

## 2018-11-26 ENCOUNTER — Other Ambulatory Visit: Payer: Self-pay | Admitting: Family

## 2018-11-26 DIAGNOSIS — F411 Generalized anxiety disorder: Secondary | ICD-10-CM

## 2018-12-03 ENCOUNTER — Other Ambulatory Visit: Payer: Self-pay | Admitting: Family

## 2018-12-03 DIAGNOSIS — K589 Irritable bowel syndrome without diarrhea: Secondary | ICD-10-CM

## 2018-12-10 ENCOUNTER — Other Ambulatory Visit: Payer: Self-pay | Admitting: Family

## 2018-12-10 DIAGNOSIS — F411 Generalized anxiety disorder: Secondary | ICD-10-CM

## 2018-12-10 DIAGNOSIS — Z79899 Other long term (current) drug therapy: Secondary | ICD-10-CM

## 2018-12-11 ENCOUNTER — Encounter: Payer: Self-pay | Admitting: Cardiology

## 2018-12-11 ENCOUNTER — Telehealth (INDEPENDENT_AMBULATORY_CARE_PROVIDER_SITE_OTHER): Payer: Medicare Other | Admitting: Cardiology

## 2018-12-11 VITALS — BP 132/78 | Ht 62.0 in | Wt 140.0 lb

## 2018-12-11 DIAGNOSIS — I10 Essential (primary) hypertension: Secondary | ICD-10-CM

## 2018-12-11 DIAGNOSIS — R002 Palpitations: Secondary | ICD-10-CM | POA: Diagnosis not present

## 2018-12-11 NOTE — Progress Notes (Signed)
Virtual Visit via Telephone Note   This visit type was conducted due to national recommendations for restrictions regarding the COVID-19 Pandemic (e.g. social distancing) in an effort to limit this patient's exposure and mitigate transmission in our community.  Due to her co-morbid illnesses, this patient is at least at moderate risk for complications without adequate follow up.  This format is felt to be most appropriate for this patient at this time.  The patient did not have access to video technology/had technical difficulties with video requiring transitioning to audio format only (telephone).  All issues noted in this document were discussed and addressed.  No physical exam could be performed with this format.  Please refer to the patient's chart for her  consent to telehealth for [facility]   Date:  12/11/2018   ID:  Kaitlyn Serrano, DOB [birth year], MRN [id number]  Patient Location: Home Provider Location: Office  PCP:  Sharion Balloon, FNP  Cardiologist:  Carlyle Dolly, MD  Electrophysiologist:  None   Evaluation Performed:  Follow-Up Visit  Chief Complaint:  HTN   History of Present Illness:    Kaitlyn Serrano is a 78 y.o. female seen today for follow up of the following medical problems. This is a focused visit on recent issues with HTN, for more detailed history please refer to prior clinic notes.     1. HTN - recent ER visit 09/2018 with high bp's. SBPs to 180s and chest pain. Chest pain resolved with bp control, discharged from ER. Trops neg. No recurrence of chest pain since that ER visit.   recent issues with high bp, we increased her lisionpril to 20 mg daily  - bps are running 130s/80s, taking lisinopril 20mg  daily.    2. Palpitations - seen in ER 11/2015 with palpitations. Telemetry monitor throughtout visit showed NSR per ER notes. CT PE negative, negative evaluation for ACS  she has been started on diltiazem by another provider, remains on  atenolol    The patient does not have symptoms concerning for COVID-19 infection (fever, chills, cough, or new shortness of breath).    Past Medical History:  Diagnosis Date  . Asthma   . Blood transfusion   . Blurred vision   . CAD (coronary artery disease)   . Chest pain   . CHF (congestive heart failure) ([place])   . Chronic pain syndrome   . Cough   . Cyanosis   . Diabetes ([place])   . Diverticulosis   . Double vision   . 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 Dysphagia   . Dyspnea   . Exposure to TB   . Facial pain   . Generalized weakness   . GERD (gastroesophageal reflux disease)   . GERD (gastroesophageal reflux disease)   . Glaucoma   . Hearing loss   . Heart murmur   . Hemorrhoids   . Hemorrhoids   . High cholesterol   . Hoarseness   . Hx of cardiovascular stress test    negative 2012 and 2016  . Hypertension   . IBS (irritable bowel syndrome)   . Insomnia   . Intervertebral disc disorder with myelopathy, lumbar region   . Irregular heart beat   . Leg swelling   . Lumbago   . Lumbar spine pain   . MI (myocardial infarction) ([place])   . Nasal congestion   . Night sweats   . Palpitations   . Rectal bleeding   . Renal insufficiency   .  Ringing in ears   . Sinus infection   . Spondylosis with myelopathy, lumbar region   . Syncope   . Valvular heart disease   . Vertigo   . Vision loss   . Visual disorder   . Wheezing    Past Surgical History:  Procedure Laterality Date  . ABDOMINAL HYSTERECTOMY    . BACK SURGERY    . BONE MARROW ASPIRATION    . CERVICAL SPINE SURGERY    . FIXATION KYPHOPLASTY    . LAMINOTOMY    . SPINAL FUSION       No outpatient medications have been marked as taking for the 12/11/18 encounter (Appointment) with Arnoldo Lenis, MD.     Allergies:   Bee venom, Amlodipine, Elavil [amitriptyline hcl], Statins, and Penicillins   Social History   Tobacco Use  . Smoking status: Former Smoker    Packs/day: 4.00    Years:  3.00    Pack years: 12.00    Types: Cigarettes    Start date: 06/10/1960    Quit date: 02/20/1964    Years since quitting: 54.8  . Smokeless tobacco: Never Used  Substance Use Topics  . Alcohol use: No    Alcohol/week: 0.0 standard drinks  . Drug use: No     Family Hx: The patient's family history includes Diabetes in her mother; Gallbladder disease in her mother and another family member; Heart disease in her father and mother; Hyperlipidemia in her brother; Hypertension in her brother, child, and mother; Pneumonia in her sister.  ROS:   Please see the history of present illness.     All other systems reviewed and are negative.   Prior CV studies:   The following studies were reviewed today:  Echo 04/2010 [facility] LVEF 123456, grade I diastolic dysfunction, mild MR  04/2010 [facility]Myoview No ischemia. Exercised 8 minutes, 9 METs, 95% THR.  12/2007 Renal artery [place] No stenosis, normal kidney size.   06/01/13 Clinic EKG NSR, LAD  05/2014 Exercise MPI IMPRESSION: 1. No reversible ischemia or infarction.  2. Normal left ventricular wall motion.  3. Left ventricular ejection fraction 57%  4. Low-risk stress test findings*.   08/2014 Echo Study Conclusions  - Left ventricle: The cavity size was normal. Wall thickness was normal. Systolic function was normal. The estimated ejection fraction was in the range of 60% to 65%. Wall motion was normal; there were no regional wall motion abnormalities. Doppler parameters are consistent with abnormal left ventricular relaxation (grade 1 diastolic dysfunction). - Aortic valve: Mildly calcified annulus. Trileaflet. - Mitral valve: Mildly calcified annulus. Mildly calcified leaflets . There was mild regurgitation. - Tricuspid valve: There was mild regurgitation.  Labs/Other Tests and Data Reviewed:    EKG:  No ECG reviewed.  Recent Labs: 10/01/2018: ALT 12; BUN 13; Creatinine, Ser 0.66; Potassium 4.7;  Sodium 134 11/04/2018: Hemoglobin 14.7; Platelets 323   Recent Lipid Panel Lab Results  Component Value Date/Time   CHOL 176 07/10/2018 11:35 AM   TRIG 113 07/10/2018 11:35 AM   HDL 51 07/10/2018 11:35 AM   CHOLHDL 3.5 07/10/2018 11:35 AM   LDLCALC 102 (H) 07/10/2018 11:35 AM    Wt Readings from Last 3 Encounters:  10/01/18 139 lb 12.8 oz (63.4 kg)  08/05/18 147 lb 9 oz (66.9 kg)  07/10/18 146 lb 12.8 oz (66.6 kg)     Objective:    Vital Signs:   Today's Vitals   12/11/18 1057  BP: 132/78  Weight: 140 lb (63.5 kg)  Height: 5\' 2"  (1.575 m)   Body mass index is 25.61 kg/m. Normal affect. Normal speech pattern and tone. Comfortable, no apparent distress. No audible signs of SOB or wheezing.   ASSESSMENT & PLAN:    1. HTN - appears to be controlled since recent medication changs, continue to monitor - check BMET with lisinopril change  2. Palpitations - no recent symptoms, conitnue to monitor   COVID-19 Education: The signs and symptoms of COVID-19 were discussed with the patient and how to seek care for testing (follow up with PCP or arrange E-visit).  The importance of social distancing was discussed today.  Time:   Today, I have spent 13 minutes with the patient with telehealth technology discussing the above problems.     Medication Adjustments/Labs and Tests Ordered: Current medicines are reviewed at length with the patient today.  Concerns regarding medicines are outlined above.   Tests Ordered: No orders of the defined types were placed in this encounter.   Medication Changes: No orders of the defined types were placed in this encounter.   Follow Up:  In Person in 4 month(s)  Signed, Carlyle Dolly, MD  12/11/2018 8:37 AM    [place]

## 2018-12-11 NOTE — Patient Instructions (Signed)
Your physician recommends that you schedule a follow-up appointment in: [place]  Your physician recommends that you continue on your current medications as directed. Please refer to the Current Medication list given to you today.  Your physician recommends that you return for lab work BMP - [place] DO NOT NEED TO BE FASTING   Thank you for choosing [facility]!!

## 2018-12-17 ENCOUNTER — Other Ambulatory Visit: Payer: Self-pay

## 2018-12-21 ENCOUNTER — Encounter: Payer: Self-pay | Admitting: Family

## 2018-12-21 ENCOUNTER — Ambulatory Visit (INDEPENDENT_AMBULATORY_CARE_PROVIDER_SITE_OTHER): Payer: Medicare Other | Admitting: Family

## 2018-12-21 DIAGNOSIS — E663 Overweight: Secondary | ICD-10-CM | POA: Diagnosis not present

## 2018-12-21 DIAGNOSIS — F1320 Sedative, hypnotic or anxiolytic dependence, uncomplicated: Secondary | ICD-10-CM | POA: Diagnosis not present

## 2018-12-21 DIAGNOSIS — F331 Major depressive disorder, recurrent, moderate: Secondary | ICD-10-CM

## 2018-12-21 DIAGNOSIS — M25551 Pain in right hip: Secondary | ICD-10-CM

## 2018-12-21 DIAGNOSIS — I159 Secondary hypertension, unspecified: Secondary | ICD-10-CM | POA: Diagnosis not present

## 2018-12-21 DIAGNOSIS — F411 Generalized anxiety disorder: Secondary | ICD-10-CM

## 2018-12-21 DIAGNOSIS — G4700 Insomnia, unspecified: Secondary | ICD-10-CM

## 2018-12-21 MED ORDER — ATENOLOL 25 MG PO TABS: 25.0000 mg | ORAL_TABLET | Freq: Every day | ORAL | 1 refills | Status: DC

## 2018-12-21 NOTE — Progress Notes (Signed)
Virtual Visit via telephone Note Due to COVID-19 pandemic this visit was conducted virtually. This visit type was conducted due to national recommendations for restrictions regarding the COVID-19 Pandemic (e.g. social distancing, sheltering in place) in an effort to limit this patient's exposure and mitigate transmission in our community. All issues noted in this document were discussed and addressed.  A physical exam was not performed with this format.  I connected with Kaitlyn Serrano on 12/21/18 at 9:33 AM by telephone and verified that I am speaking with the correct person using two identifiers. AMBERROSE Serrano is currently located at home and daughter  is currently with her during visit. The provider, Evelina Dun, FNP is located in their office at time of visit.  I discussed the limitations, risks, security and privacy concerns of performing an evaluation and management service by telephone and the availability of in person appointments. I also discussed with the patient that there may be a patient responsible charge related to this service. The patient expressed understanding and agreed to proceed.   History and Present Illness:  Pt is followed by [facility] every month for hip and back pain. She is followed by Cardiologists every 6 months. She reports she has been out of her xanax for the last 3 days and needs this refilled. However, she continues to get Norco  10-325 mg #120 a month. We discussed on previous visits she could not continue to get both of these medications. She also takes trazodone 50 mg daily and Zanaflex BID.   Pt's daughter is living with her at this time. She has a very complicated family dynamitic and changes very frequently between her daughters. Her daughter states she is helping her mother with her medications at this time.    Hypertension This is a chronic problem. The current episode started more than 1 year ago. The problem has been resolved since onset. The  problem is controlled. Associated symptoms include anxiety. Pertinent negatives include no malaise/fatigue, peripheral edema or shortness of breath. Risk factors for coronary artery disease include dyslipidemia, obesity and sedentary lifestyle. Past treatments include ACE inhibitors and calcium channel blockers. The current treatment provides moderate improvement. Hypertensive end-organ damage includes kidney disease.  Arthritis Presents for follow-up visit. She complains of pain and joint swelling. The symptoms have been stable. Affected locations include the right hip (back). Her pain is at a severity of 7/10. Pertinent negatives include no dysuria.  Insomnia Primary symptoms: difficulty falling asleep, frequent awakening, no malaise/fatigue.  The current episode started more than one year. The onset quality is gradual. The problem has been waxing and waning since onset.  Anxiety Presents for follow-up visit. Symptoms include depressed mood, excessive worry, insomnia, irritability, nervous/anxious behavior and restlessness. Patient reports no shortness of breath. Symptoms occur most days. The severity of symptoms is moderate.        Review of Systems  Constitutional: Positive for irritability. Negative for malaise/fatigue.  Respiratory: Negative for shortness of breath.   Genitourinary: Negative for dysuria.  Musculoskeletal: Positive for arthritis and joint swelling.  Psychiatric/Behavioral: The patient is nervous/anxious and has insomnia.   All other systems reviewed and are negative.    Observations/Objective: No SOB or distress noted, 134/84  Assessment and Plan: 1. Secondary hypertension - CMP14+EGFR; Future - CBC with Differential/Platelet; Future  2. Overweight (BMI 25.0-29.9) - CMP14+EGFR; Future - CBC with Differential/Platelet; Future  3. Moderate episode of recurrent major depressive disorder (HCC) - CMP14+EGFR; Future - CBC with Differential/Platelet; Future  4.  Benzodiazepine dependence (HCC) - CMP14+EGFR; Future - CBC with Differential/Platelet; Future  5. GAD (generalized anxiety disorder) - CMP14+EGFR; Future - CBC with Differential/Platelet; Future  6. Insomnia, unspecified type - CMP14+EGFR; Future - CBC with Differential/Platelet; Future  7. Right hip pain - CMP14+EGFR; Future - CBC with Differential/Platelet; Future  I will not reorder Xanax and we will discontinue off her medication list. We discussed the risks given her age and mixing with other medications. She is very happy about this, but states she understands.  Keep follow up with Specialist  Labs pending RTO in 3 months      I discussed the assessment and treatment plan with the patient. The patient was provided an opportunity to ask questions and all were answered. The patient agreed with the plan and demonstrated an understanding of the instructions.   The patient was advised to call back or seek an in-person evaluation if the symptoms worsen or if the condition fails to improve as anticipated.  The above assessment and management plan was discussed with the patient. The patient verbalized understanding of and has agreed to the management plan. Patient is aware to call the clinic if symptoms persist or worsen. Patient is aware when to return to the clinic for a follow-up visit. Patient educated on when it is appropriate to go to the emergency department.   Time call ended:  9:59 AM  I provided 26 minutes of non-face-to-face time during this encounter.    Evelina Dun, FNP

## 2019-01-01 ENCOUNTER — Encounter: Payer: Self-pay | Admitting: Family

## 2019-01-01 ENCOUNTER — Ambulatory Visit (INDEPENDENT_AMBULATORY_CARE_PROVIDER_SITE_OTHER): Payer: Medicare Other | Admitting: Family

## 2019-01-01 ENCOUNTER — Telehealth: Payer: Self-pay | Admitting: Family

## 2019-01-01 DIAGNOSIS — J019 Acute sinusitis, unspecified: Secondary | ICD-10-CM

## 2019-01-01 DIAGNOSIS — F411 Generalized anxiety disorder: Secondary | ICD-10-CM

## 2019-01-01 MED ORDER — BUSPIRONE HCL 7.5 MG PO TABS: 7.5000 mg | ORAL_TABLET | Freq: Two times a day (BID) | ORAL | 3 refills | Status: DC

## 2019-01-01 MED ORDER — DOXYCYCLINE HYCLATE 100 MG PO TABS: 100.0000 mg | ORAL_TABLET | Freq: Two times a day (BID) | ORAL | 0 refills | Status: DC

## 2019-01-01 NOTE — Progress Notes (Signed)
   Virtual Visit via telephone Note Due to COVID-19 pandemic this visit was conducted virtually. This visit type was conducted due to national recommendations for restrictions regarding the COVID-19 Pandemic (e.g. social distancing, sheltering in place) in an effort to limit this patient's exposure and mitigate transmission in our community. All issues noted in this document were discussed and addressed.  A physical exam was not performed with this format.  I connected with Velia Meyer on 01/01/19 at 10:19 AM  by telephone and verified that I am speaking with the correct person using two identifiers. CHAMIKA KINSEL is currently located at home and daughter  is currently with her  during visit. The provider, Evelina Dun, FNP is located in their office at time of visit.  I discussed the limitations, risks, security and privacy concerns of performing an evaluation and management service by telephone and the availability of in person appointments. I also discussed with the patient that there may be a patient responsible charge related to this service. The patient expressed understanding and agreed to proceed.   History and Present Illness:  Sinus Problem This is a new problem. The current episode started 1 to 4 weeks ago. The problem has been gradually worsening since onset. There has been no fever. Her pain is at a severity of 10/10. The pain is moderate. Associated symptoms include congestion, ear pain, headaches, shortness of breath ("at times"), sinus pressure and sneezing. Pertinent negatives include no coughing. Past treatments include acetaminophen and oral decongestants. The treatment provided mild relief.      Review of Systems  HENT: Positive for congestion, ear pain, sinus pressure and sneezing.   Respiratory: Positive for shortness of breath ("at times"). Negative for cough.   Neurological: Positive for headaches.  All other systems reviewed and are negative.     Observations/Objective: No SOB or distress noted  Assessment and Plan: 1. Acute sinusitis, recurrence not specified, unspecified location PT will go get COVID tested today - Take meds as prescribed - Use a cool mist humidifier  -Use saline nose sprays frequently -Force fluids -For any cough or congestion  Use plain Mucinex- regular strength or max strength is fine -For fever or aces or pains- take tylenol or ibuprofen. -Throat lozenges if help -Call if symptoms worsen or do not improve  - doxycycline (VIBRA-TABS) 100 MG tablet; Take 1 tablet (100 mg total) by mouth 2 (two) times daily.  Dispense: 20 tablet; Refill: 0    I discussed the assessment and treatment plan with the patient. The patient was provided an opportunity to ask questions and all were answered. The patient agreed with the plan and demonstrated an understanding of the instructions.   The patient was advised to call back or seek an in-person evaluation if the symptoms worsen or if the condition fails to improve as anticipated.  The above assessment and management plan was discussed with the patient. The patient verbalized understanding of and has agreed to the management plan. Patient is aware to call the clinic if symptoms persist or worsen. Patient is aware when to return to the clinic for a follow-up visit. Patient educated on when it is appropriate to go to the emergency department.   Time call ended:  10:29 AM  I provided 10 minutes of non-face-to-face time during this encounter.    Evelina Dun, FNP

## 2019-01-01 NOTE — Telephone Encounter (Signed)
Prescription sent to pharmacy.

## 2019-01-06 ENCOUNTER — Other Ambulatory Visit: Payer: Self-pay | Admitting: Family

## 2019-01-18 ENCOUNTER — Other Ambulatory Visit: Payer: Self-pay | Admitting: Family

## 2019-01-18 DIAGNOSIS — I10 Essential (primary) hypertension: Secondary | ICD-10-CM

## 2019-01-28 ENCOUNTER — Other Ambulatory Visit: Payer: Medicare Other

## 2019-01-28 ENCOUNTER — Other Ambulatory Visit: Payer: Self-pay

## 2019-01-28 ENCOUNTER — Other Ambulatory Visit: Payer: Self-pay | Admitting: Family

## 2019-01-28 DIAGNOSIS — E663 Overweight: Secondary | ICD-10-CM

## 2019-01-28 DIAGNOSIS — M25551 Pain in right hip: Secondary | ICD-10-CM

## 2019-01-28 DIAGNOSIS — F1320 Sedative, hypnotic or anxiolytic dependence, uncomplicated: Secondary | ICD-10-CM

## 2019-01-28 DIAGNOSIS — G4700 Insomnia, unspecified: Secondary | ICD-10-CM

## 2019-01-28 DIAGNOSIS — I159 Secondary hypertension, unspecified: Secondary | ICD-10-CM

## 2019-01-28 DIAGNOSIS — F331 Major depressive disorder, recurrent, moderate: Secondary | ICD-10-CM

## 2019-01-28 DIAGNOSIS — F411 Generalized anxiety disorder: Secondary | ICD-10-CM

## 2019-01-29 LAB — CBC WITH DIFFERENTIAL/PLATELET
Basophils Absolute: 0.1 10*3/uL (ref 0.0–0.2)
Basos: 1 %
EOS (ABSOLUTE): 0.2 10*3/uL (ref 0.0–0.4)
Eos: 2 %
Hematocrit: 45.8 % (ref 34.0–46.6)
Hemoglobin: 15.2 g/dL (ref 11.1–15.9)
Immature Grans (Abs): 0 10*3/uL (ref 0.0–0.1)
Immature Granulocytes: 0 %
Lymphocytes Absolute: 1.5 10*3/uL (ref 0.7–3.1)
Lymphs: 19 %
MCH: 30.7 pg (ref 26.6–33.0)
MCHC: 33.2 g/dL (ref 31.5–35.7)
MCV: 93 fL (ref 79–97)
Monocytes Absolute: 0.9 10*3/uL (ref 0.1–0.9)
Monocytes: 11 %
Neutrophils Absolute: 5.5 10*3/uL (ref 1.4–7.0)
Neutrophils: 67 %
Platelets: 286 10*3/uL (ref 150–450)
RBC: 4.95 x10E6/uL (ref 3.77–5.28)
RDW: 11.6 % — ABNORMAL LOW (ref 11.7–15.4)
WBC: 8.2 10*3/uL (ref 3.4–10.8)

## 2019-01-29 LAB — CMP14+EGFR
ALT: 7 IU/L (ref 0–32)
AST: 16 IU/L (ref 0–40)
Albumin/Globulin Ratio: 1.6 (ref 1.2–2.2)
Albumin: 4.2 g/dL (ref 3.7–4.7)
Alkaline Phosphatase: 97 IU/L (ref 39–117)
BUN/Creatinine Ratio: 15 (ref 12–28)
BUN: 9 mg/dL (ref 8–27)
Bilirubin Total: 0.5 mg/dL (ref 0.0–1.2)
CO2: 27 mmol/L (ref 20–29)
Calcium: 9.4 mg/dL (ref 8.7–10.3)
Chloride: 99 mmol/L (ref 96–106)
Creatinine, Ser: 0.6 mg/dL (ref 0.57–1.00)
GFR calc Af Amer: 101 mL/min/{1.73_m2} (ref >59)
GFR calc non Af Amer: 88 mL/min/{1.73_m2} (ref >59)
Globulin, Total: 2.6 g/dL (ref 1.5–4.5)
Glucose: 89 mg/dL (ref 65–99)
Potassium: 4.3 mmol/L (ref 3.5–5.2)
Sodium: 137 mmol/L (ref 134–144)
Total Protein: 6.8 g/dL (ref 6.0–8.5)

## 2019-02-03 ENCOUNTER — Other Ambulatory Visit: Payer: Self-pay | Admitting: Family

## 2019-02-09 ENCOUNTER — Inpatient Hospital Stay: Admit: 2019-02-09 | Payer: Medicare Other | Admitting: Orthopedic Surgery

## 2019-02-09 SURGERY — ARTHROPLASTY, HIP, TOTAL, ANTERIOR APPROACH
Anesthesia: Spinal | Site: Hip | Laterality: Right

## 2019-03-01 ENCOUNTER — Ambulatory Visit: Payer: Medicare Other | Admitting: Cardiology

## 2019-03-03 ENCOUNTER — Other Ambulatory Visit: Payer: Self-pay | Admitting: Family

## 2019-03-11 DIAGNOSIS — M5106 Intervertebral disc disorders with myelopathy, lumbar region: Secondary | ICD-10-CM | POA: Diagnosis not present

## 2019-03-11 DIAGNOSIS — G894 Chronic pain syndrome: Secondary | ICD-10-CM | POA: Diagnosis not present

## 2019-03-11 DIAGNOSIS — M791 Myalgia, unspecified site: Secondary | ICD-10-CM | POA: Diagnosis not present

## 2019-03-11 DIAGNOSIS — M4325 Fusion of spine, thoracolumbar region: Secondary | ICD-10-CM | POA: Diagnosis not present

## 2019-04-06 ENCOUNTER — Telehealth: Payer: Self-pay | Admitting: Family

## 2019-04-06 NOTE — Telephone Encounter (Signed)
Left message to call back  

## 2019-04-06 NOTE — Telephone Encounter (Signed)
Per daughter, patient has not been taking Xanax for several months.  Appointment schedule to establish with Dr. Lajuana Ripple on 04/09/2019 at 2:00 pm.

## 2019-04-06 NOTE — Telephone Encounter (Signed)
If she is no longer taking Xanax, then I will accept her.  Otherwise, she will have to continue care under [facility].

## 2019-04-07 DIAGNOSIS — H1045 Other chronic allergic conjunctivitis: Secondary | ICD-10-CM | POA: Diagnosis not present

## 2019-04-07 DIAGNOSIS — Z0100 Encounter for examination of eyes and vision without abnormal findings: Secondary | ICD-10-CM | POA: Diagnosis not present

## 2019-04-07 DIAGNOSIS — H04123 Dry eye syndrome of bilateral lacrimal glands: Secondary | ICD-10-CM | POA: Diagnosis not present

## 2019-04-07 DIAGNOSIS — H401131 Primary open-angle glaucoma, bilateral, mild stage: Secondary | ICD-10-CM | POA: Diagnosis not present

## 2019-04-08 DIAGNOSIS — M1611 Unilateral primary osteoarthritis, right hip: Secondary | ICD-10-CM | POA: Diagnosis not present

## 2019-04-08 DIAGNOSIS — M545 Low back pain: Secondary | ICD-10-CM | POA: Diagnosis not present

## 2019-04-08 DIAGNOSIS — M7061 Trochanteric bursitis, right hip: Secondary | ICD-10-CM | POA: Diagnosis not present

## 2019-04-08 DIAGNOSIS — G894 Chronic pain syndrome: Secondary | ICD-10-CM | POA: Diagnosis not present

## 2019-04-09 ENCOUNTER — Ambulatory Visit: Payer: Medicare Other | Admitting: Family Medicine

## 2019-04-12 ENCOUNTER — Other Ambulatory Visit: Payer: Self-pay | Admitting: Family

## 2019-04-12 DIAGNOSIS — I10 Essential (primary) hypertension: Secondary | ICD-10-CM

## 2019-04-19 ENCOUNTER — Ambulatory Visit: Payer: Medicare Other | Admitting: Cardiology

## 2019-04-19 NOTE — Progress Notes (Deleted)
Clinical Summary [name] is a 79 y.o.female   1. HTN - recent ER visit 09/2018 with high bp's. SBPs to 180s and chest pain. Chest pain resolved with bp control, discharged from ER. Trops neg. No recurrence of chest pain since that ER visit.   recent issues with high bp, we increased her lisionpril to 20 mg daily  - bps are running 130s/80s, taking lisinopril 20mg  daily.    2. Palpitations - seen in ER 11/2015 with palpitations. Telemetry monitor throughtout visit showed NSR per ER notes. CT PE negative, negative evaluation for ACS  she has been started on diltiazem by another provider, remains on atenolol    1. Leg swelling - echo showed LVEF 123456, grade I diastolic dysfunction.  - improved after stopping norvasc.  - no recent edema   2Chest pain - previous workup in 2012 per old [place] notes with negative myoview for ischemia. Echo showed normal LV function -05/2014 completed exercise MPI. Low risk [facility] treadmill score of 6, no ischemia on imaging.  - no recent chest pain. No SOB/DOE - highest level of activity is doing housework/vacuuming, tolerates without troubles - can walk up flight of stairs without significant symptoms.   5. Preop evaluation - being considered for hip replacement - highest level of activity is doing housework/vacuuming, tolerates without troubles - can walk up flight of stairs without significant symptoms.   Past Medical History:  Diagnosis Date  . Asthma   . Blood transfusion   . Blurred vision   . CAD (coronary artery disease)   . Chest pain   . CHF (congestive heart failure) ([place])   . Chronic pain syndrome   . Cough   . Cyanosis   . Diabetes ([place])   . Diverticulosis   . Double vision   . 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 Dysphagia   . Dyspnea   . Exposure to TB   . Facial pain   . Generalized weakness   . GERD (gastroesophageal reflux disease)   . GERD (gastroesophageal reflux disease)   . Glaucoma     . Hearing loss   . Heart murmur   . Hemorrhoids   . Hemorrhoids   . High cholesterol   . Hoarseness   . Hx of cardiovascular stress test    negative 2012 and 2016  . Hypertension   . IBS (irritable bowel syndrome)   . Insomnia   . Intervertebral disc disorder with myelopathy, lumbar region   . Irregular heart beat   . Leg swelling   . Lumbago   . Lumbar spine pain   . MI (myocardial infarction) ([place])   . Nasal congestion   . Night sweats   . Palpitations   . Rectal bleeding   . Renal insufficiency   . Ringing in ears   . Sinus infection   . Spondylosis with myelopathy, lumbar region   . Syncope   . Valvular heart disease   . Vertigo   . Vision loss   . Visual disorder   . Wheezing      Allergies  Allergen Reactions  . Bee Venom Anaphylaxis  . Amlodipine Swelling  . Elavil [Amitriptyline Hcl] Other (See Comments)    confusion  . Statins Swelling and Other (See Comments)    Peeling of skin  . Penicillins Rash    Has patient had a PCN reaction causing immediate rash, facial/tongue/throat swelling, SOB or lightheadedness with hypotension: Yes Has patient had a PCN reaction  causing severe rash involving mucus membranes or skin necrosis: Yes Has patient had a PCN reaction that required hospitalization: Yes Has patient had a PCN reaction occurring within the last 10 years: No If all of the above answers are "NO", then may proceed with Cephalosporin use.      Current Outpatient Medications  Medication Sig Dispense Refill  . atenolol (TENORMIN) 25 MG tablet Take 1 tablet (25 mg total) by mouth daily. 90 tablet 1  . busPIRone (BUSPAR) 7.5 MG tablet Take 1 tablet (7.5 mg total) by mouth 2 (two) times daily. 60 tablet 3  . dicyclomine (BENTYL) 10 MG capsule TAKE  (1)  CAPSULE  TWICE DAILY (TAKE ON AN EMPTY STOMACH AT LEAST 30 MIN- UTES BEFORE MEALS). 60 capsule 2  . diltiazem (TIAZAC) 120 MG 24 hr capsule TAKE (1) CAPSULE DAILY 90 capsule 0  . doxycycline (VIBRA-TABS)  100 MG tablet Take 1 tablet (100 mg total) by mouth 2 (two) times daily. 20 tablet 0  . DULoxetine (CYMBALTA) 30 MG capsule TAKE (1) CAPSULE DAILY 90 capsule 0  . EPINEPHrine 0.3 mg/0.3 mL IJ SOAJ injection Inject 0.3 mLs (0.3 mg total) into the muscle once. (Patient not taking: Reported on 12/21/2018) 2 Device 1  . Estradiol Acetate (FEMRING) 0.05 MG/24HR RING INSERT (1) INTO THE VAGINA AS DIR- ECTED. -FOR VAGINAL USE- 1 each 2  . fluticasone (FLONASE) 50 MCG/ACT nasal spray Place 2 sprays into both nostrils daily. (Patient taking differently: Place 2 sprays into both nostrils daily as needed for allergies. ) 16 g 6  . furosemide (LASIX) 20 MG tablet TAKE 1 TABLET DAILY 30 tablet 3  . HYDROcodone-acetaminophen (NORCO) 10-325 MG tablet     . lisinopril (ZESTRIL) 20 MG tablet Take 1 tablet (20 mg total) by mouth daily. 90 tablet 1  . loratadine (CLARITIN) 10 MG tablet Take 1 tablet (10 mg total) by mouth daily. 30 tablet 11  . polyethylene glycol powder (GLYCOLAX/MIRALAX) 17 GM/SCOOP powder Take 17 g by mouth 2 (two) times daily as needed. (Patient taking differently: Take 17 g by mouth daily as needed for moderate constipation. ) 3162 g 0  . tiZANidine (ZANAFLEX) 2 MG tablet     . traZODone (DESYREL) 50 MG tablet TAKE ONE TABLET AT BEDTIME 90 tablet 0   No current facility-administered medications for this visit.     Past Surgical History:  Procedure Laterality Date  . ABDOMINAL HYSTERECTOMY    . BACK SURGERY    . BONE MARROW ASPIRATION    . CERVICAL SPINE SURGERY    . FIXATION KYPHOPLASTY    . LAMINOTOMY    . SPINAL FUSION       Allergies  Allergen Reactions  . Bee Venom Anaphylaxis  . Amlodipine Swelling  . Elavil [Amitriptyline Hcl] Other (See Comments)    confusion  . Statins Swelling and Other (See Comments)    Peeling of skin  . Penicillins Rash    Has patient had a PCN reaction causing immediate rash, facial/tongue/throat swelling, SOB or lightheadedness with hypotension:  Yes Has patient had a PCN reaction causing severe rash involving mucus membranes or skin necrosis: Yes Has patient had a PCN reaction that required hospitalization: Yes Has patient had a PCN reaction occurring within the last 10 years: No If all of the above answers are "NO", then may proceed with Cephalosporin use.       Family History  Problem Relation Age of Onset  . Diabetes Mother   . Heart disease Mother   .  Hypertension Mother   . Gallbladder disease Mother   . Heart disease Father   . Pneumonia Sister   . Hypertension Brother   . Hyperlipidemia Brother   . Gallbladder disease Other   . Hypertension Child      Social History [name] reports that she quit smoking about 55 years ago. Her smoking use included cigarettes. She started smoking about 58 years ago. She has a 12.00 pack-year smoking history. She has never used smokeless tobacco. [name] reports no history of alcohol use.   Review of Systems CONSTITUTIONAL: No weight loss, fever, chills, weakness or fatigue.  HEENT: Eyes: No visual loss, blurred vision, double vision or yellow sclerae.No hearing loss, sneezing, congestion, runny nose or sore throat.  SKIN: No rash or itching.  CARDIOVASCULAR:  RESPIRATORY: No shortness of breath, cough or sputum.  GASTROINTESTINAL: No anorexia, nausea, vomiting or diarrhea. No abdominal pain or blood.  GENITOURINARY: No burning on urination, no polyuria NEUROLOGICAL: No headache, dizziness, syncope, paralysis, ataxia, numbness or tingling in the extremities. No change in bowel or bladder control.  MUSCULOSKELETAL: No muscle, back pain, joint pain or stiffness.  LYMPHATICS: No enlarged nodes. No history of splenectomy.  PSYCHIATRIC: No history of depression or anxiety.  ENDOCRINOLOGIC: No reports of sweating, cold or heat intolerance. No polyuria or polydipsia.  Marland Kitchen   Physical Examination There were no vitals filed for this visit. There were no vitals filed for this  visit.  Gen: resting comfortably, no acute distress HEENT: no scleral icterus, pupils equal round and reactive, no palptable cervical adenopathy,  CV Resp: Clear to auscultation bilaterally GI: abdomen is soft, non-tender, non-distended, normal bowel sounds, no hepatosplenomegaly MSK: extremities are warm, no edema.  Skin: warm, no rash Neuro:  no focal deficits Psych: appropriate affect   Diagnostic Studies  Echo 04/2010 [facility] LVEF 123456, grade I diastolic dysfunction, mild MR  04/2010 ExerciseMyoview No ischemia. Exercised 8 minutes, 9 METs, 95% THR.  12/2007 Renal artery [place] No stenosis, normal kidney size.   06/01/13 Clinic EKG NSR, LAD  05/2014 Exercise MPI IMPRESSION: 1. No reversible ischemia or infarction.  2. Normal left ventricular wall motion.  3. Left ventricular ejection fraction 57%  4. Low-risk stress test findings*.   08/2014 Echo Study Conclusions  - Left ventricle: The cavity size was normal. Wall thickness was normal. Systolic function was normal. The estimated ejection fraction was in the range of 60% to 65%. Wall motion was normal; there were no regional wall motion abnormalities. Doppler parameters are consistent with abnormal left ventricular relaxation (grade 1 diastolic dysfunction). - Aortic valve: Mildly calcified annulus. Trileaflet. - Mitral valve: Mildly calcified annulus. Mildly calcified leaflets . There was mild regurgitation. - Tricuspid valve: There was mild regurgitation.   Assessment and Plan   1. HTN - appears to be controlled since recent medication changs, continue to monitor - check BMET with lisinopril change  2. Palpitations - no recent symptoms, conitnue to monitor  1. Leg swelling - resolved, looks to have been related to norvasc though she is on low dose diuretic - continue to monitor.   2. Chest pain - several year history, negative stress testing in 2012 and 2016 - no recent  symptoms, continue to monitor.    5. Preoperative evaluation - no active cardiac conditions. Prior cardiac testing has been bening though a few years ago. From her verbal report tolerates greater than 4 METs without issues. From cardiac standpoint ok to proceed with hip surgery  Arnoldo Lenis, M.D.

## 2019-04-26 ENCOUNTER — Other Ambulatory Visit: Payer: Self-pay | Admitting: Family

## 2019-04-26 DIAGNOSIS — F411 Generalized anxiety disorder: Secondary | ICD-10-CM

## 2019-04-28 ENCOUNTER — Ambulatory Visit: Payer: Medicare Other | Admitting: Family Medicine

## 2019-04-28 ENCOUNTER — Encounter: Payer: Self-pay | Admitting: Family Medicine

## 2019-04-28 ENCOUNTER — Other Ambulatory Visit: Payer: Self-pay

## 2019-04-28 DIAGNOSIS — M1611 Unilateral primary osteoarthritis, right hip: Secondary | ICD-10-CM | POA: Diagnosis not present

## 2019-04-28 DIAGNOSIS — M25551 Pain in right hip: Secondary | ICD-10-CM | POA: Diagnosis not present

## 2019-04-29 ENCOUNTER — Ambulatory Visit (INDEPENDENT_AMBULATORY_CARE_PROVIDER_SITE_OTHER): Payer: Medicare HMO | Admitting: Family Medicine

## 2019-04-29 ENCOUNTER — Encounter: Payer: Self-pay | Admitting: Family Medicine

## 2019-04-29 VITALS — BP 116/64 | HR 53 | Temp 97.5°F | Ht 62.0 in | Wt 130.0 lb

## 2019-04-29 DIAGNOSIS — F411 Generalized anxiety disorder: Secondary | ICD-10-CM | POA: Diagnosis not present

## 2019-04-29 DIAGNOSIS — F331 Major depressive disorder, recurrent, moderate: Secondary | ICD-10-CM | POA: Diagnosis not present

## 2019-04-29 DIAGNOSIS — Z01818 Encounter for other preprocedural examination: Secondary | ICD-10-CM | POA: Diagnosis not present

## 2019-04-29 DIAGNOSIS — H9202 Otalgia, left ear: Secondary | ICD-10-CM

## 2019-04-29 DIAGNOSIS — I10 Essential (primary) hypertension: Secondary | ICD-10-CM

## 2019-04-29 MED ORDER — LORATADINE 10 MG PO TABS: 10.0000 mg | ORAL_TABLET | Freq: Every day | ORAL | 11 refills | Status: DC

## 2019-04-29 MED ORDER — BUSPIRONE HCL 10 MG PO TABS: 10.0000 mg | ORAL_TABLET | Freq: Three times a day (TID) | ORAL | 1 refills | Status: DC

## 2019-04-29 MED ORDER — BUSPIRONE HCL 7.5 MG PO TABS: 7.5000 mg | ORAL_TABLET | Freq: Three times a day (TID) | ORAL | 0 refills | Status: AC

## 2019-04-29 NOTE — Progress Notes (Signed)
Subjective: CC: Establish care, preop evaluation.  Anxiety, depressive disorder, chronic pain PCP: Janora Norlander, DO [id number] T Kaitlyn Serrano is a 79 y.o. female, who is accompanied by her daughter, Helene Kelp today.  She is presenting to clinic today for:  1. Preop evaluation Pt is a 79 y.o. female who is here for preoperative clearance for right hip replacement with Dr. Alvan Dame.  She is currently utilizing a walker secondary to severe pain and instability.  She is already gotten cardiac clearance from Dr. Harl Bowie.  She denies any chest pain, shortness of breath, difficulty swallowing.  She is never had issues with anesthesia in the past.  Denies any history of postop vomiting or difficulty waking from anesthesia. She is currently residing with her daughter [name] who is providing much of her care.  1) High Risk Cardiac Conditions  1) Recent MI - No.  2) Decompensated Heart Failure - No.  3) Unstable angina - No.  4) Symptomatic arrythmia - No.  5) Sx Valvular Disease - No.  2) Intermediate Risk Factors - DM, CKD, CVA, CHF, CAD - No.  2) Functional Status - > 4 mets (Walk, run, climb stairs) Yes.  Marland Kitchen  3) Surgery Specific Risk -    Intermediate (Carotid, Head and Neck, Orthopaedic )  4) Further Noninvasive evaluation -   1) EKG - No.   1) Hx of CVA, CAD, DM, CKD  2) Echo - No.   1) Worsening dyspnea   3) Stress Testing - Active Cardiac Disease - No.  5) Need for medical therapy - Beta Blocker, Statins indicated ? No.  2.  Anxiety and depression Patient reports longstanding history of anxiety and depression.  She is compliant with Cymbalta, BuSpar and trazodone.  Her daughter does note uncontrolled anxiety.  She thinks that she would benefit from increased dose of BuSpar.  Patient was previously treated with a benzodiazepine but has been weaned off of this for several months now.  Denies any history of hospitalizations for depressive disorder or anxiety disorder.  No previous history of  suicidal attempt or homicidal attempt.  No suicidal ideation or homicidal ideation.  She is treated with chronic pain medication for underlying back pain status post surgical intervention.  She denies any changes in mental status, falls.  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  She is treated with Bentyl for this.  She sees [facility].   ROS: Per HPI  Allergies  Allergen Reactions  . Bee Venom Anaphylaxis  . Penicillins Anaphylaxis and Rash    Has patient had a PCN reaction causing immediate rash, facial/tongue/throat swelling, SOB or lightheadedness with hypotension: Yes Has patient had a PCN reaction causing severe rash involving mucus membranes or skin necrosis: Yes Has patient had a PCN reaction that required hospitalization: Yes Has patient had a PCN reaction occurring within the last 10 years: No If all of the above answers are "NO", then may proceed with Cephalosporin use.   . Amlodipine Swelling  . Elavil [Amitriptyline Hcl] Other (See Comments)    Confusion, hallucinations  . Statins Swelling and Other (See Comments)    Peeling of skin   Past Medical History:  Diagnosis Date  . Asthma   . 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 Blood transfusion   . Blurred vision   . CAD (coronary artery disease)   . Chest pain   . CHF (congestive heart failure) ([place])   . Chronic pain syndrome   . Cough   . Cyanosis   .  Diabetes ([place])   . Diverticulosis   . Double vision   . 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 Dysphagia   . Dyspnea   . Exposure to TB   . Facial pain   . Generalized weakness   . GERD (gastroesophageal reflux disease)   . GERD (gastroesophageal reflux disease)   . Glaucoma   . Hearing loss   . Heart murmur   . Hemorrhoids   . Hemorrhoids   . High cholesterol   . Hoarseness   . Hx of cardiovascular stress test    negative 2012 and 2016  . Hypertension   . IBS (irritable bowel syndrome)   . Insomnia   . Intervertebral disc disorder with myelopathy,  lumbar region   . Irregular heart beat   . Leg swelling   . Lumbago   . Lumbar spine pain   . MI (myocardial infarction) ([place])   . Nasal congestion   . Night sweats   . Palpitations   . Rectal bleeding   . Renal insufficiency   . Ringing in ears   . Sinus infection   . Spondylosis with myelopathy, lumbar region   . Syncope   . Valvular heart disease   . Vertigo   . Vision loss   . Visual disorder   . Wheezing     Current Outpatient Medications:  .  atenolol (TENORMIN) 25 MG tablet, Take 1 tablet (25 mg total) by mouth daily., Disp: 90 tablet, Rfl: 1 .  dicyclomine (BENTYL) 10 MG capsule, TAKE  (1)  CAPSULE  TWICE DAILY (TAKE ON AN EMPTY STOMACH AT LEAST 30 MIN- UTES BEFORE MEALS)., Disp: 60 capsule, Rfl: 2 .  diltiazem (TIAZAC) 120 MG 24 hr capsule, TAKE (1) CAPSULE DAILY, Disp: 90 capsule, Rfl: 0 .  DULoxetine (CYMBALTA) 30 MG capsule, TAKE (1) CAPSULE DAILY.  Needs to be seen for future refills., Disp: 30 capsule, Rfl: 0 .  Estradiol Acetate (FEMRING) 0.05 MG/24HR RING, INSERT (1) INTO THE VAGINA AS DIR- ECTED. -FOR VAGINAL USE-, Disp: 1 each, Rfl: 2 .  fluticasone (FLONASE) 50 MCG/ACT nasal spray, Place 2 sprays into both nostrils daily. (Patient taking differently: Place 2 sprays into both nostrils daily as needed for allergies. ), Disp: 16 g, Rfl: 6 .  HYDROcodone-acetaminophen (NORCO) 10-325 MG tablet, , Disp: , Rfl:  .  polyethylene glycol powder (GLYCOLAX/MIRALAX) 17 GM/SCOOP powder, Take 17 g by mouth 2 (two) times daily as needed. (Patient taking differently: Take 17 g by mouth daily as needed for moderate constipation. ), Disp: 3162 g, Rfl: 0 .  traZODone (DESYREL) 50 MG tablet, TAKE ONE TABLET AT BEDTIME, Disp: 90 tablet, Rfl: 0 .  busPIRone (BUSPAR) 10 MG tablet, Take 1 tablet (10 mg total) by mouth 3 (three) times daily., Disp: 90 tablet, Rfl: 1 .  busPIRone (BUSPAR) 7.5 MG tablet, Take 1 tablet (7.5 mg total) by mouth 3 (three) times daily for 7 days. Then start the  10mg  tablets, Disp: 21 tablet, Rfl: 0 .  EPINEPHrine 0.3 mg/0.3 mL IJ SOAJ injection, Inject 0.3 mLs (0.3 mg total) into the muscle once. (Patient not taking: Reported on 12/21/2018), Disp: 2 Device, Rfl: 1 .  loratadine (CLARITIN) 10 MG tablet, Take 1 tablet (10 mg total) by mouth daily., Disp: 30 tablet, Rfl: 11 Social History   Socioeconomic History  . Marital status: Divorced    Spouse name: Not on file  . Number of children: 2  . Years of education: Not on file  .  Highest education level: Not on file  Occupational History  . Not on file  Tobacco Use  . Smoking status: Former Smoker    Packs/day: 4.00    Years: 3.00    Pack years: 12.00    Types: Cigarettes    Start date: 06/10/1960    Quit date: 02/20/1964    Years since quitting: 55.2  . Smokeless tobacco: Never Used  Substance and Sexual Activity  . Alcohol use: No    Alcohol/week: 0.0 standard drinks  . Drug use: No  . Sexual activity: Not Currently    Birth control/protection: None  Other Topics Concern  . Not on file  Social History Narrative   ** Merged History Encounter **      [name] lives alone. She is separated/divorced from her second husband and widowed by her first husband. She has twin daughters, [name] and [name]. [name] has been a cause of stress for her and she believes was preventing her from getting medical care that she needed. She is no longer talking to [place]. She states that [name] is involved in her care and that she trusts her to have her best interest in mind.        Social Determinants of Health   Financial Resource Strain: Low Risk   . Difficulty of Paying Living Expenses: Not hard at all  Food Insecurity: No Food Insecurity  . Worried About Charity fundraiser in the Last Year: Never true  . Ran Out of Food in the Last Year: Never true  Transportation Needs: No Transportation Needs  . Lack of Transportation (Medical): No  . Lack of Transportation (Non-Medical): No  Physical  Activity: Inactive  . Days of Exercise per Week: 0 days  . Minutes of Exercise per Session: 0 min  Stress: Stress Concern Present  . Feeling of Stress : To some extent  Social Connections: Moderately Isolated  . Frequency of Communication with Friends and Family: More than three times a week  . Frequency of Social Gatherings with Friends and Family: Three times a week  . Attends Religious Services: Never  . Active Member of Clubs or Organizations: No  . Attends Archivist Meetings: Never  . Marital Status: Divorced  Human resources officer Violence: Not At Risk  . Fear of Current or Ex-Partner: No  . Emotionally Abused: No  . Physically Abused: No  . Sexually Abused: No   Family History  Problem Relation Age of Onset  . Diabetes Mother   . Heart disease Mother   . Hypertension Mother   . Gallbladder disease Mother   . Heart disease Father   . Pneumonia Sister   . Hypertension Brother   . Hyperlipidemia Brother   . Gallbladder disease Other   . Hypertension Child     Objective: Office vital signs reviewed. BP 116/64   Pulse (!) 53   Temp (!) 97.5 F (36.4 C) (Temporal)   Ht 5\' 2"  (1.575 m)   Wt 130 lb (59 kg)   SpO2 98%   BMI 23.78 kg/m   Physical Examination:  General: Awake, alert, thin, chronically ill appearing female, No acute distress HEENT: Normal, sclera white, MMM; has moderate active range of motion in extension extension of the C-spine.  Mallampati 1; TMs intact bilaterally.  No evidence of infection. Cardio: regular rate and rhythm, S1S2 heard, no murmurs appreciated Pulm: clear to auscultation bilaterally, no wheezes, rhonchi or rales; normal work of breathing on room air Extremities: warm, well perfused, No  edema, cyanosis or clubbing; +2 pulses bilaterally MSK: antalgic gait and station; using walker for ambulation Skin: dry; intact; no rashes or lesions  Assessment/ Plan: 79 y.o. female   1. Pre-op evaluation I have independently evaluated  patient.  ITZABELLA RAFUSE is a 79 y.o. female who is low risk for an intermediate risk surgery.  There are not modifiable risk factors (smoking, etc). Alija Cantrelle 's RCRI calculation for MACE is: 0.  Her preop form has been completed and will be faxed to Dr Alvan Dame.  2. Essential hypertension Controlled  3. Moderate episode of recurrent major depressive disorder (HCC) Stable  4. GAD (generalized anxiety disorder) Not controlled.  We discussed increasing the BuSpar to 7.5 mg 3 times daily and then after 1 week can increase to 10 mg 3 times daily.  She will follow-up in about 4 to 6 weeks for recheck.  We can increase dose accordingly if needed at that time - busPIRone (BUSPAR) 7.5 MG tablet; Take 1 tablet (7.5 mg total) by mouth 3 (three) times daily for 7 days. Then start the 10mg  tablets  Dispense: 21 tablet; Refill: 0 - busPIRone (BUSPAR) 10 MG tablet; Take 1 tablet (10 mg total) by mouth 3 (three) times daily.  Dispense: 90 tablet; Refill: 1  5. Otalgia, left Likely secondary to eustachian tube dysfunction.  No evidence of infection on today's exam.  Recommend Claritin use.   No orders of the defined types were placed in this encounter.  Meds ordered this encounter  Medications  . busPIRone (BUSPAR) 7.5 MG tablet    Sig: Take 1 tablet (7.5 mg total) by mouth 3 (three) times daily for 7 days. Then start the 10mg  tablets    Dispense:  21 tablet    Refill:  0  . busPIRone (BUSPAR) 10 MG tablet    Sig: Take 1 tablet (10 mg total) by mouth 3 (three) times daily.    Dispense:  90 tablet    Refill:  1  . loratadine (CLARITIN) 10 MG tablet    Sig: Take 1 tablet (10 mg total) by mouth daily.    Dispense:  30 tablet    Refill:  [place], [place] [phone]

## 2019-05-05 ENCOUNTER — Other Ambulatory Visit: Payer: Self-pay | Admitting: Family

## 2019-05-05 ENCOUNTER — Other Ambulatory Visit: Payer: Self-pay | Admitting: Cardiology

## 2019-05-05 DIAGNOSIS — I10 Essential (primary) hypertension: Secondary | ICD-10-CM

## 2019-05-05 NOTE — Telephone Encounter (Signed)
PRE OP CLEARANCE RECEIVED FROM [facility] AND PLACED ON DR. BRANCH'S DESK

## 2019-05-12 ENCOUNTER — Other Ambulatory Visit: Payer: Self-pay | Admitting: Cardiology

## 2019-05-12 DIAGNOSIS — G894 Chronic pain syndrome: Secondary | ICD-10-CM | POA: Diagnosis not present

## 2019-05-12 DIAGNOSIS — M1611 Unilateral primary osteoarthritis, right hip: Secondary | ICD-10-CM | POA: Diagnosis not present

## 2019-05-12 DIAGNOSIS — M545 Low back pain: Secondary | ICD-10-CM | POA: Diagnosis not present

## 2019-05-12 DIAGNOSIS — M7061 Trochanteric bursitis, right hip: Secondary | ICD-10-CM | POA: Diagnosis not present

## 2019-05-14 ENCOUNTER — Other Ambulatory Visit: Payer: Self-pay | Admitting: Cardiology

## 2019-05-14 MED ORDER — ATENOLOL 25 MG PO TABS: 25.0000 mg | ORAL_TABLET | Freq: Every day | ORAL | 1 refills | Status: DC

## 2019-05-14 MED ORDER — DILTIAZEM HCL ER BEADS 120 MG PO CP24: ORAL_CAPSULE | ORAL | 0 refills | Status: DC

## 2019-05-14 NOTE — Telephone Encounter (Signed)
Sent refills in for Atenlool, & Diltiazem.

## 2019-05-14 NOTE — Telephone Encounter (Signed)
Daughter is asking for all medications to be sent to [facility]. [place] [phone]  Thanks renee

## 2019-05-20 DIAGNOSIS — M1611 Unilateral primary osteoarthritis, right hip: Secondary | ICD-10-CM | POA: Diagnosis not present

## 2019-05-20 NOTE — H&P (Signed)
TOTAL HIP ADMISSION H&P  Patient is admitted for right total hip arthroplasty, anterior approach.  Subjective:  Chief Complaint:   Right hip pain  HPI: Kaitlyn Serrano, 79 y.o. female, has a history of pain and functional disability in the right hip(s) due to trauma and arthritis and patient has failed non-surgical conservative treatments for greater than 12 weeks to include NSAID's and/or analgesics, corticosteriod injections, use of assistive devices and activity modification.  Onset of symptoms was abrupt starting 3+ years ago with gradually worsening course since that time.The patient noted no past surgery on the right hip(s).  Patient currently rates pain in the right hip at 10 out of 10 with activity. Patient has night pain, worsening of pain with activity and weight bearing, trendelenberg gait, pain that interfers with activities of daily living and pain with passive range of motion. Patient has evidence of periarticular osteophytes and joint space narrowing by imaging studies. This condition presents safety issues increasing the risk of falls.  There is no current active infection.  Risks, benefits and expectations were discussed with the patient.  Risks including but not limited to the risk of anesthesia, blood clots, nerve damage, blood vessel damage, failure of the prosthesis, infection and up to and including death.  Patient understand the risks, benefits and expectations and wishes to proceed with surgery.   PCP: Janora Norlander, DO  D/C Plans:       Home  Post-op Meds:       No Rx given   Tranexamic Acid:      To be given - IV   Decadron:      Is to be given  FYI:      Xarelto (states that she is not able to use ASA)  Oxycodone (on Norco already)  DME:   Pt already has equipment  PT:   [facility]  Pharmacy: [place]    Patient Active Problem List   Diagnosis Date Noted  . Right hip pain 11/03/2018  . Overweight (BMI 25.0-29.9) 07/10/2018  . Prediabetes 02/12/2017   . 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 Constipation 01/16/2015  . Insomnia 01/16/2015  . GAD (generalized anxiety disorder) 10/08/2013  . Vitamin D deficiency 10/08/2013  . GERD (gastroesophageal reflux disease) 10/08/2013  . Hypertension 08/13/2012  . Hyperlipemia 08/13/2012  . Seasonal allergic rhinitis 06/30/2012  . Hemorrhoids 06/25/2011   Past Medical History:  Diagnosis Date  . Asthma   . Benzodiazepine dependence ([place]) 12/30/2017  . Blood transfusion   . Blurred vision   . CAD (coronary artery disease)   . Chest pain   . CHF (congestive heart failure) ([place])   . Chronic pain syndrome   . Cough   . Cyanosis   . Diabetes ([place])   . Diverticulosis   . Double vision   . 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 Dysphagia   . Dyspnea   . Exposure to TB   . Facial pain   . Generalized weakness   . GERD (gastroesophageal reflux disease)   . GERD (gastroesophageal reflux disease)   . Glaucoma   . Hearing loss   . Heart murmur   . Hemorrhoids   . Hemorrhoids   . High cholesterol   . Hoarseness   . Hx of cardiovascular stress test    negative 2012 and 2016  . Hypertension   . IBS (irritable bowel syndrome)   . Insomnia   . Intervertebral disc disorder with myelopathy, lumbar region   . Irregular heart  beat   . Leg swelling   . Lumbago   . Lumbar spine pain   . MI (myocardial infarction) ([place])   . Nasal congestion   . Night sweats   . Palpitations   . Rectal bleeding   . Renal insufficiency   . Ringing in ears   . Sinus infection   . Spondylosis with myelopathy, lumbar region   . Syncope   . Valvular heart disease   . Vertigo   . Vision loss   . Visual disorder   . Wheezing     Past Surgical History:  Procedure Laterality Date  . ABDOMINAL HYSTERECTOMY    . BACK SURGERY    . BONE MARROW ASPIRATION    . CERVICAL SPINE SURGERY    . FIXATION KYPHOPLASTY    . LAMINOTOMY    . SPINAL FUSION      No current facility-administered medications for this encounter.    Current Outpatient Medications  Medication Sig Dispense Refill Last Dose  . atenolol (TENORMIN) 25 MG tablet Take 1 tablet (25 mg total) by mouth daily. 90 tablet 1   . busPIRone (BUSPAR) 10 MG tablet Take 1 tablet (10 mg total) by mouth 3 (three) times daily. 90 tablet 1   . dicyclomine (BENTYL) 10 MG capsule TAKE  (1)  CAPSULE  TWICE DAILY (TAKE ON AN EMPTY STOMACH AT LEAST 30 MIN- UTES BEFORE MEALS). 60 capsule 2   . diltiazem (TIAZAC) 120 MG 24 hr capsule TAKE (1) CAPSULE DAILY 90 capsule 0   . DULoxetine (CYMBALTA) 30 MG capsule TAKE (1) CAPSULE DAILY.  Needs to be seen for future refills. 30 capsule 0   . EPINEPHrine 0.3 mg/0.3 mL IJ SOAJ injection Inject 0.3 mLs (0.3 mg total) into the muscle once. (Patient not taking: Reported on 12/21/2018) 2 Device 1   . Estradiol Acetate (FEMRING) 0.05 MG/24HR RING INSERT (1) INTO THE VAGINA AS DIR- ECTED. -FOR VAGINAL USE- 1 each 2   . fluticasone (FLONASE) 50 MCG/ACT nasal spray Place 2 sprays into both nostrils daily. (Patient taking differently: Place 2 sprays into both nostrils daily as needed for allergies. ) 16 g 6   . HYDROcodone-acetaminophen (NORCO) 10-325 MG tablet      . loratadine (CLARITIN) 10 MG tablet Take 1 tablet (10 mg total) by mouth daily. 30 tablet 11   . polyethylene glycol powder (GLYCOLAX/MIRALAX) 17 GM/SCOOP powder Take 17 g by mouth 2 (two) times daily as needed. (Patient taking differently: Take 17 g by mouth daily as needed for moderate constipation. ) 3162 g 0   . traZODone (DESYREL) 50 MG tablet TAKE ONE TABLET AT BEDTIME 90 tablet 0    Allergies  Allergen Reactions  . Bee Venom Anaphylaxis  . Penicillins Anaphylaxis and Rash    Has patient had a PCN reaction causing immediate rash, facial/tongue/throat swelling, SOB or lightheadedness with hypotension: Yes Has patient had a PCN reaction causing severe rash involving mucus membranes or skin necrosis: Yes Has patient had a PCN reaction that required hospitalization:  Yes Has patient had a PCN reaction occurring within the last 10 years: No If all of the above answers are "NO", then may proceed with Cephalosporin use.   . Amlodipine Swelling  . Elavil [Amitriptyline Hcl] Other (See Comments)    Confusion, hallucinations  . Statins Swelling and Other (See Comments)    Peeling of skin    Social History   Tobacco Use  . Smoking status: Former Smoker    Packs/day: 4.00  Years: 3.00    Pack years: 12.00    Types: Cigarettes    Start date: 06/10/1960    Quit date: 02/20/1964    Years since quitting: 55.2  . Smokeless tobacco: Never Used  Substance Use Topics  . Alcohol use: No    Alcohol/week: 0.0 standard drinks    Family History  Problem Relation Age of Onset  . Diabetes Mother   . Heart disease Mother   . Hypertension Mother   . Gallbladder disease Mother   . Heart disease Father   . Pneumonia Sister   . Hypertension Brother   . Hyperlipidemia Brother   . Gallbladder disease Other   . Hypertension Child      Review of Systems  Constitutional: Negative.   HENT: Negative.   Eyes: Negative.   Respiratory: Negative.   Cardiovascular: Negative.   Gastrointestinal: Positive for heartburn.  Genitourinary: Negative.   Musculoskeletal: Positive for joint pain.  Skin: Negative.   Neurological: Negative.   Endo/Heme/Allergies: Negative.   Psychiatric/Behavioral: Positive for depression. The patient is nervous/anxious.       Objective:  Physical Exam  Constitutional: She is oriented to person, place, and time. She appears well-developed.  HENT:  Head: Normocephalic.  Eyes: Pupils are equal, round, and reactive to light.  Neck: No JVD present. No tracheal deviation present. No thyromegaly present.  Cardiovascular: Normal rate, regular rhythm and intact distal pulses.  Respiratory: Effort normal and breath sounds normal. No respiratory distress. She has no wheezes.  GI: Soft. There is no abdominal tenderness. There is no  guarding.  Musculoskeletal:     Cervical back: Neck supple.     Right hip: Tenderness and bony tenderness present. No swelling, deformity or lacerations. Decreased range of motion. Decreased strength.  Lymphadenopathy:    She has no cervical adenopathy.  Neurological: She is alert and oriented to person, place, and time. A sensory deficit (occasional bilateral LE numbness) is present.  Skin: Skin is warm and dry.  Psychiatric: She has a normal mood and affect.     Labs:  Estimated body mass index is 23.78 kg/m as calculated from the following:   Height as of 04/29/19: 5\' 2"  (1.575 m).   Weight as of 04/29/19: 59 kg.   Imaging Review Plain radiographs demonstrate severe degenerative joint disease of the right hip. The bone quality appears to be good for age and reported activity level.      Assessment/Plan:  End stage arthritis, right hip  The patient history, physical examination, clinical judgement of the provider and imaging studies are consistent with end stage degenerative joint disease of the right hip and total hip arthroplasty is deemed medically necessary. The treatment options including medical management, injection therapy, arthroscopy and arthroplasty were discussed at length. The risks and benefits of total hip arthroplasty were presented and reviewed. The risks due to aseptic loosening, infection, stiffness, dislocation/subluxation,  thromboembolic complications and other imponderables were discussed.  The patient acknowledged the explanation, agreed to proceed with the plan and consent was signed. Patient is being admitted for inpatient treatment for surgery, pain control, PT, OT, prophylactic antibiotics, VTE prophylaxis, progressive ambulation and ADL's and discharge planning.The patient is planning to be discharged home.    [place] Pugh Floella Ensz   PA-C  05/20/2019, 3:36 PM

## 2019-05-24 ENCOUNTER — Other Ambulatory Visit: Payer: Self-pay | Admitting: Cardiology

## 2019-05-25 ENCOUNTER — Telehealth: Payer: Self-pay | Admitting: Family Medicine

## 2019-05-25 ENCOUNTER — Telehealth: Payer: Self-pay | Admitting: *Deleted

## 2019-05-25 ENCOUNTER — Other Ambulatory Visit: Payer: Self-pay | Admitting: Family Medicine

## 2019-05-25 MED ORDER — LISINOPRIL 20 MG PO TABS: 20.0000 mg | ORAL_TABLET | Freq: Every day | ORAL | 3 refills | Status: DC

## 2019-05-25 NOTE — Telephone Encounter (Signed)
Fax from [facility] refill Request RF on Lisinopril 20 mg 1 QD #90 Not on med list Lst Ov 04/29/19 Next OV 06/15/19

## 2019-06-01 ENCOUNTER — Other Ambulatory Visit: Payer: Self-pay | Admitting: Family

## 2019-06-03 NOTE — Patient Instructions (Addendum)
DUE TO COVID-19 ONLY ONE VISITOR IS ALLOWED TO COME WITH YOU AND STAY IN THE WAITING ROOM ONLY DURING PRE OP AND PROCEDURE DAY OF SURGERY. THE 2 VISITORS  MAY VISIT WITH YOU AFTER SURGERY IN YOUR PRIVATE ROOM DURING VISITING HOURS ONLY!  YOU NEED TO HAVE A COVID 19 TEST ON__4/9_____ @__3 :15_____, THIS TEST MUST BE DONE BEFORE SURGERY, COME  [address] [place] , [address].  ([place]) ONCE YOUR COVID TEST IS COMPLETED, PLEASE BEGIN THE QUARANTINE INSTRUCTIONS AS OUTLINED IN YOUR HANDOUT.                TUESDAE SENTMAN     Your procedure is scheduled on: 06/08/19   Report to [facility] Main  Entrance   Report to admitting at  9:00 AM  (surgery time is subject to change bu we will call you and let you know of the change the day before)     Call this number if you have problems the morning of surgery [place], [place].   Do not eat food After Midnight.   YOU MAY HAVE CLEAR LIQUIDS FROM MIDNIGHT UNTIL 8:30 AM.    CLEAR LIQUID DIET   Foods Allowed                                                                     Foods Excluded  Coffee and tea, regular and decaf                             liquids that you cannot  Plain Jell-O any favor except red or purple                                           see through such as: Fruit ices (not with fruit pulp)                                     milk, soups, orange juice  Iced Popsicles                                    All solid food Carbonated beverages, regular and diet                                    Cranberry, grape and apple juices Sports drinks like Gatorade Lightly seasoned clear broth or consume Sugar, honey syrup      At 8:30 AM Please finish the prescribed Pre-Surgery Gatorade drink.   Nothing by mouth after you finish the Gatorade drink !   Take these medicines the morning of surgery with A  SIP OF WATER:   BUSPIRONE,CLARITIN, ATENOLOL, DILTIAZEM, CYMBALTA, pain med if needed  You may not have any metal on your body including hair pins and              piercings  Do not wear jewelry, make-up, lotions, powders or perfumes, deodorant             Do not wear nail polish on your fingernails.  Do not shave  48 hours prior to surgery.           Do not bring valuables to the hospital. [place].  Contacts, dentures or bridgework may not be worn into surgery.        Special Instructions: N/A              Please read over the following fact sheets you were given: _____________________________________________________________________             [facility] - Preparing for Surgery Before surgery, you can play an important role.   Because skin is not sterile, your skin needs to be as free of germs as possible.   You can reduce the number of germs on your skin by washing with CHG (chlorahexidine gluconate) soap before surgery.   CHG is an antiseptic cleaner which kills germs and bonds with the skin to continue killing germs even after washing. Please DO NOT use if you have an allergy to CHG or antibacterial soaps.   If your skin becomes reddened/irritated stop using the CHG and inform your nurse when you arrive at Short Stay. Do not shave (including legs and underarms) for at least 48 hours prior to the first CHG shower.   Please follow these instructions carefully:  1.  Shower with CHG Soap the night before surgery and the  morning of Surgery.  2.  If you choose to wash your hair, wash your hair first as usual with your  normal  shampoo.  3.  After you shampoo, rinse your hair and body thoroughly to remove the  shampoo.                                        4.  Use CHG as you would any other liquid soap.  You can apply chg directly  to the skin and wash                       Gently with a scrungie  or clean washcloth.  5.  Apply the CHG Soap to your body ONLY FROM THE NECK DOWN.   Do not use on face/ open                           Wound or open sores. Avoid contact with eyes, ears mouth and genitals (private parts).                       Wash face,  Genitals (private parts) with your normal soap.             6.  Wash thoroughly, paying special attention to the area where your surgery  will be performed.  7.  Thoroughly rinse your body with warm water from the neck down.  8.  DO NOT shower/wash with your normal soap after using and rinsing off  the CHG Soap.             9.  Pat yourself dry with a clean towel.            10.  Wear clean pajamas.            11.  Place clean sheets on your bed the night of your first shower and do not  sleep with pets. Day of Surgery : Do not apply any lotions/deodorants the morning of surgery.  Please wear clean clothes to the hospital/surgery center.  FAILURE TO FOLLOW THESE INSTRUCTIONS MAY RESULT IN THE CANCELLATION OF YOUR SURGERY PATIENT SIGNATURE_________________________________  NURSE SIGNATURE__________________________________  ________________________________________________________________________   Adam Phenix  An incentive spirometer is a tool that can help keep your lungs clear and active. This tool measures how well you are filling your lungs with each breath. Taking long deep breaths may help reverse or decrease the chance of developing breathing (pulmonary) problems (especially infection) following:  A long period of time when you are unable to move or be active. BEFORE THE PROCEDURE   If the spirometer includes an indicator to show your best effort, your nurse or respiratory therapist will set it to a desired goal.  If possible, sit up straight or lean slightly forward. Try not to slouch.  Hold the incentive spirometer in an upright position. INSTRUCTIONS FOR USE  1. Sit on the edge of your bed if possible, or sit up as  far as you can in bed or on a chair. 2. Hold the incentive spirometer in an upright position. 3. Breathe out normally. 4. Place the mouthpiece in your mouth and seal your lips tightly around it. 5. Breathe in slowly and as deeply as possible, raising the piston or the ball toward the top of the column. 6. Hold your breath for 3-5 seconds or for as long as possible. Allow the piston or ball to fall to the bottom of the column. 7. Remove the mouthpiece from your mouth and breathe out normally. 8. Rest for a few seconds and repeat Steps 1 through 7 at least 10 times every 1-2 hours when you are awake. Take your time and take a few normal breaths between deep breaths. 9. The spirometer may include an indicator to show your best effort. Use the indicator as a goal to work toward during each repetition. 10. After each set of 10 deep breaths, practice coughing to be sure your lungs are clear. If you have an incision (the cut made at the time of surgery), support your incision when coughing by placing a pillow or rolled up towels firmly against it. Once you are able to get out of bed, walk around indoors and cough well. You may stop using the incentive spirometer when instructed by your caregiver.  RISKS AND COMPLICATIONS  Take your time so you do not get dizzy or light-headed.  If you are in pain, you may need to take or ask for pain medication before doing incentive spirometry. It is harder to take a deep breath if you are having pain. AFTER USE  Rest and breathe slowly and easily.  It can be helpful to keep track of a log of your progress. Your caregiver can provide you with a simple table to help with this. If you are using the spirometer at home, follow these instructions: [place] IF:   You are having difficultly using the spirometer.  You have trouble using the spirometer as often as instructed.  Your pain medication is not giving enough relief while using the spirometer.  You  develop fever of 100.5 F (38.1 C) or higher. SEEK IMMEDIATE MEDICAL CARE IF:   You cough up bloody sputum that had not been present before.  You develop fever of 102 F (38.9 C) or greater.  You develop worsening pain at or near the incision site. MAKE SURE YOU:   Understand these instructions.  Will watch your condition.  Will get help right away if you are not doing well or get worse. Document Released: 06/24/2006 Document Revised: 05/06/2011 Document Reviewed: 08/25/2006 Exit[employer] Patient Information 2014 [employer], [place].   ________________________________________________________________________  WHAT IS A BLOOD TRANSFUSION? Blood Transfusion Information  A transfusion is the replacement of blood or some of its parts. Blood is made up of multiple cells which provide different functions.  Red blood cells carry oxygen and are used for blood loss replacement.  White blood cells fight against infection.  Platelets control bleeding.  Plasma helps clot blood.  Other blood products are available for specialized needs, such as hemophilia or other clotting disorders. BEFORE THE TRANSFUSION  Who gives blood for transfusions?   Healthy volunteers who are fully evaluated to make sure their blood is safe. This is blood bank blood. Transfusion therapy is the safest it has ever been in the practice of medicine. Before blood is taken from a donor, a complete history is taken to make sure that person has no history of diseases nor engages in risky social behavior (examples are intravenous drug use or sexual activity with multiple partners). The donor's travel history is screened to minimize risk of transmitting infections, such as malaria. The donated blood is tested for signs of infectious diseases, such as HIV and hepatitis. The blood is then tested to be sure it is compatible with you in order to minimize the chance of a transfusion reaction. If you or a relative donates blood, this is  often done in anticipation of surgery and is not appropriate for emergency situations. It takes many days to process the donated blood. RISKS AND COMPLICATIONS Although transfusion therapy is very safe and saves many lives, the main dangers of transfusion include:   Getting an infectious disease.  Developing a transfusion reaction. This is an allergic reaction to something in the blood you were given. Every precaution is taken to prevent this. The decision to have a blood transfusion has been considered carefully by your caregiver before blood is given. Blood is not given unless the benefits outweigh the risks. AFTER THE TRANSFUSION  Right after receiving a blood transfusion, you will usually feel much better and more energetic. This is especially true if your red blood cells have gotten low (anemic). The transfusion raises the level of the red blood cells which carry oxygen, and this usually causes an energy increase.  The nurse administering the transfusion will monitor you carefully for complications. HOME CARE INSTRUCTIONS  No special instructions are needed after a transfusion. You may find your energy is better. Speak with your caregiver about any limitations on activity for underlying diseases you may have. SEEK MEDICAL CARE IF:   Your condition is not improving after your transfusion.  You develop redness or irritation at the intravenous (IV) site. SEEK IMMEDIATE MEDICAL CARE IF:  Any of the following symptoms occur over the next 12 hours:  Shaking chills.  You have a temperature by mouth above 102 F (38.9 C), not controlled by medicine.  Chest, back, or muscle pain.  People  around you feel you are not acting correctly or are confused.  Shortness of breath or difficulty breathing.  Dizziness and fainting.  You get a rash or develop hives.  You have a decrease in urine output.  Your urine turns a dark color or changes to pink, red, or brown. Any of the following  symptoms occur over the next 10 days:  You have a temperature by mouth above 102 F (38.9 C), not controlled by medicine.  Shortness of breath.  Weakness after normal activity.  The white part of the eye turns yellow (jaundice).  You have a decrease in the amount of urine or are urinating less often.  Your urine turns a dark color or changes to pink, red, or brown. Document Released: 02/09/2000 Document Revised: 05/06/2011 Document Reviewed: 09/28/2007 [facility] Patient Information 2014 [place], [place].  _______________________________________________________________________

## 2019-06-04 ENCOUNTER — Encounter (HOSPITAL_COMMUNITY)
Admission: RE | Admit: 2019-06-04 | Discharge: 2019-06-04 | Disposition: A | Discharge status: Home / Self Care | Payer: Medicare HMO | Source: Ambulatory Visit | Attending: Orthopedic Surgery | Admitting: Orthopedic Surgery

## 2019-06-04 ENCOUNTER — Other Ambulatory Visit: Payer: Self-pay

## 2019-06-04 ENCOUNTER — Other Ambulatory Visit (HOSPITAL_COMMUNITY)
Admission: RE | Admit: 2019-06-04 | Discharge: 2019-06-04 | Disposition: A | Discharge status: Home / Self Care | Payer: Medicare HMO | Source: Ambulatory Visit | Attending: Orthopedic Surgery | Admitting: Orthopedic Surgery

## 2019-06-04 ENCOUNTER — Encounter (HOSPITAL_COMMUNITY): Payer: Self-pay

## 2019-06-04 DIAGNOSIS — Z01812 Encounter for preprocedural laboratory examination: Secondary | ICD-10-CM | POA: Insufficient documentation

## 2019-06-04 DIAGNOSIS — Z20822 Contact with and (suspected) exposure to covid-19: Secondary | ICD-10-CM | POA: Diagnosis not present

## 2019-06-04 HISTORY — DX: Other specified postprocedural states: Z98.890

## 2019-06-04 HISTORY — DX: Other complications of anesthesia, initial encounter: T88.59XA

## 2019-06-04 HISTORY — DX: Other specified postprocedural states: R11.2

## 2019-06-04 LAB — COMPREHENSIVE METABOLIC PANEL
ALT: 15 U/L (ref 0–44)
AST: 18 U/L (ref 15–41)
Albumin: 3.7 g/dL (ref 3.5–5.0)
Alkaline Phosphatase: 85 U/L (ref 38–126)
Anion gap: 7 (ref 5–15)
BUN: 14 mg/dL (ref 8–23)
CO2: 29 mmol/L (ref 22–32)
Calcium: 9.3 mg/dL (ref 8.9–10.3)
Chloride: 104 mmol/L (ref 98–111)
Creatinine, Ser: 0.71 mg/dL (ref 0.44–1.00)
GFR calc Af Amer: >60 mL/min (ref >60)
GFR calc non Af Amer: >60 mL/min (ref >60)
Glucose, Bld: 104 mg/dL — ABNORMAL HIGH (ref 70–99)
Potassium: 5.3 mmol/L — ABNORMAL HIGH (ref 3.5–5.1)
Sodium: 140 mmol/L (ref 135–145)
Total Bilirubin: 0.9 mg/dL (ref 0.3–1.2)
Total Protein: 6.8 g/dL (ref 6.5–8.1)

## 2019-06-04 LAB — CBC
HCT: 43 % (ref 36.0–46.0)
Hemoglobin: 14.4 g/dL (ref 12.0–15.0)
MCH: 30.9 pg (ref 26.0–34.0)
MCHC: 33.5 g/dL (ref 30.0–36.0)
MCV: 92.3 fL (ref 80.0–100.0)
Platelets: 270 10*3/uL (ref 150–400)
RBC: 4.66 MIL/uL (ref 3.87–5.11)
RDW: 12.4 % (ref 11.5–15.5)
WBC: 9.3 10*3/uL (ref 4.0–10.5)
nRBC: 0 % (ref 0.0–0.2)

## 2019-06-04 LAB — SURGICAL PCR SCREEN
MRSA, PCR: NEGATIVE
Staphylococcus aureus: NEGATIVE

## 2019-06-04 NOTE — Progress Notes (Signed)
PCP - Dr. Wyonia Hough Cardiologist - Dr. Zandra Abts  Chest x-ray - 11/16/18 EKG - 11/16/18 Stress Test - [id number] ECHO - 2016 Cardiac Cath - no  Sleep Study - no CPAP -   Fasting Blood Sugar - NA Checks Blood Sugar _____ times a day  Blood Thinner Instructions:NA Aspirin Instructions: Last Dose:  Anesthesia review:   Patient denies shortness of breath, fever, cough and chest pain at PAT appointment Yes. Daughter answered questions for her mother  Patient verbalized understanding of instructions that were given to them at the PAT appointment. Patient was also instructed that they will need to review over the PAT instructions again at home before surgery. yes

## 2019-06-05 LAB — SARS CORONAVIRUS 2 (TAT 6-24 HRS): SARS Coronavirus 2: NEGATIVE

## 2019-06-07 MED ORDER — GENTAMICIN SULFATE 40 MG/ML IJ SOLN
5.0000 mg/kg | INTRAVENOUS | Status: AC
  Administered 2019-06-08: 280 mg via INTRAVENOUS
  Filled 2019-06-07: qty 7

## 2019-06-08 ENCOUNTER — Ambulatory Visit (HOSPITAL_COMMUNITY): Payer: Medicare HMO | Admitting: Anesthesiology

## 2019-06-08 ENCOUNTER — Observation Stay (HOSPITAL_COMMUNITY): Payer: Medicare HMO

## 2019-06-08 ENCOUNTER — Encounter (HOSPITAL_COMMUNITY)
Admission: AD | Disposition: A | Discharge status: Home / Self Care | Payer: Self-pay | Source: Ambulatory Visit | Attending: Orthopedic Surgery

## 2019-06-08 ENCOUNTER — Ambulatory Visit (HOSPITAL_COMMUNITY): Payer: Medicare HMO

## 2019-06-08 ENCOUNTER — Encounter (HOSPITAL_COMMUNITY): Payer: Self-pay | Admitting: Orthopedic Surgery

## 2019-06-08 ENCOUNTER — Observation Stay (HOSPITAL_COMMUNITY)
Admission: AD | Admit: 2019-06-08 | Discharge: 2019-06-09 | Disposition: A | Discharge status: Home / Self Care | Payer: Medicare HMO | Source: Ambulatory Visit | Attending: Orthopedic Surgery | Admitting: Orthopedic Surgery

## 2019-06-08 ENCOUNTER — Other Ambulatory Visit: Payer: Self-pay

## 2019-06-08 DIAGNOSIS — E7800 Pure hypercholesterolemia, unspecified: Secondary | ICD-10-CM | POA: Diagnosis not present

## 2019-06-08 DIAGNOSIS — K589 Irritable bowel syndrome without diarrhea: Secondary | ICD-10-CM | POA: Diagnosis not present

## 2019-06-08 DIAGNOSIS — Z6823 Body mass index (BMI) 23.0-23.9, adult: Secondary | ICD-10-CM | POA: Diagnosis not present

## 2019-06-08 DIAGNOSIS — J45909 Unspecified asthma, uncomplicated: Secondary | ICD-10-CM | POA: Insufficient documentation

## 2019-06-08 DIAGNOSIS — Z87891 Personal history of nicotine dependence: Secondary | ICD-10-CM | POA: Diagnosis not present

## 2019-06-08 DIAGNOSIS — M1611 Unilateral primary osteoarthritis, right hip: Secondary | ICD-10-CM | POA: Diagnosis not present

## 2019-06-08 DIAGNOSIS — G4700 Insomnia, unspecified: Secondary | ICD-10-CM | POA: Diagnosis not present

## 2019-06-08 DIAGNOSIS — Z471 Aftercare following joint replacement surgery: Secondary | ICD-10-CM | POA: Diagnosis not present

## 2019-06-08 DIAGNOSIS — E663 Overweight: Secondary | ICD-10-CM | POA: Diagnosis present

## 2019-06-08 DIAGNOSIS — I110 Hypertensive heart disease with heart failure: Secondary | ICD-10-CM | POA: Diagnosis not present

## 2019-06-08 DIAGNOSIS — K219 Gastro-esophageal reflux disease without esophagitis: Secondary | ICD-10-CM | POA: Diagnosis not present

## 2019-06-08 DIAGNOSIS — Z96641 Presence of right artificial hip joint: Secondary | ICD-10-CM

## 2019-06-08 DIAGNOSIS — E119 Type 2 diabetes mellitus without complications: Secondary | ICD-10-CM | POA: Insufficient documentation

## 2019-06-08 DIAGNOSIS — Z96649 Presence of unspecified artificial hip joint: Secondary | ICD-10-CM

## 2019-06-08 DIAGNOSIS — I252 Old myocardial infarction: Secondary | ICD-10-CM | POA: Insufficient documentation

## 2019-06-08 DIAGNOSIS — I509 Heart failure, unspecified: Secondary | ICD-10-CM | POA: Diagnosis not present

## 2019-06-08 DIAGNOSIS — G894 Chronic pain syndrome: Secondary | ICD-10-CM | POA: Diagnosis not present

## 2019-06-08 DIAGNOSIS — Z79899 Other long term (current) drug therapy: Secondary | ICD-10-CM | POA: Diagnosis not present

## 2019-06-08 DIAGNOSIS — Z419 Encounter for procedure for purposes other than remedying health state, unspecified: Secondary | ICD-10-CM

## 2019-06-08 DIAGNOSIS — I2510 Atherosclerotic heart disease of native coronary artery without angina pectoris: Secondary | ICD-10-CM | POA: Insufficient documentation

## 2019-06-08 DIAGNOSIS — I10 Essential (primary) hypertension: Secondary | ICD-10-CM | POA: Insufficient documentation

## 2019-06-08 DIAGNOSIS — F411 Generalized anxiety disorder: Secondary | ICD-10-CM | POA: Insufficient documentation

## 2019-06-08 DIAGNOSIS — E785 Hyperlipidemia, unspecified: Secondary | ICD-10-CM | POA: Diagnosis not present

## 2019-06-08 HISTORY — PX: TOTAL HIP ARTHROPLASTY: SHX124

## 2019-06-08 LAB — TYPE AND SCREEN
ABO/RH(D): O NEG
Antibody Screen: NEGATIVE

## 2019-06-08 SURGERY — ARTHROPLASTY, HIP, TOTAL, ANTERIOR APPROACH
Anesthesia: General | Site: Hip | Laterality: Right

## 2019-06-08 MED ORDER — ROCURONIUM BROMIDE 100 MG/10ML IV SOLN
INTRAVENOUS | Status: DC | PRN
  Administered 2019-06-08: 10 mg via INTRAVENOUS
  Administered 2019-06-08: 40 mg via INTRAVENOUS
  Administered 2019-06-08: 10 mg via INTRAVENOUS

## 2019-06-08 MED ORDER — OXYCODONE HCL 5 MG PO TABS
5.0000 mg | ORAL_TABLET | ORAL | Status: DC | PRN
  Administered 2019-06-08 – 2019-06-09 (×2): 5 mg via ORAL
  Filled 2019-06-08: qty 1

## 2019-06-08 MED ORDER — ROCURONIUM BROMIDE 10 MG/ML (PF) SYRINGE
PREFILLED_SYRINGE | INTRAVENOUS | Status: AC
  Filled 2019-06-08: qty 10

## 2019-06-08 MED ORDER — PHENOL 1.4 % MT LIQD: 1.0000 | OROMUCOSAL | Status: DC | PRN

## 2019-06-08 MED ORDER — HYDROMORPHONE HCL 1 MG/ML IJ SOLN
0.2500 mg | INTRAMUSCULAR | Status: DC | PRN
  Administered 2019-06-08: 0.5 mg via INTRAVENOUS

## 2019-06-08 MED ORDER — FENTANYL CITRATE (PF) 250 MCG/5ML IJ SOLN
INTRAMUSCULAR | Status: AC
  Filled 2019-06-08: qty 5

## 2019-06-08 MED ORDER — FENTANYL CITRATE (PF) 100 MCG/2ML IJ SOLN
INTRAMUSCULAR | Status: AC
  Filled 2019-06-08: qty 2

## 2019-06-08 MED ORDER — LORATADINE 10 MG PO TABS
10.0000 mg | ORAL_TABLET | Freq: Every day | ORAL | Status: DC
  Administered 2019-06-09: 10 mg via ORAL
  Filled 2019-06-08: qty 1

## 2019-06-08 MED ORDER — LACTATED RINGERS IV SOLN: INTRAVENOUS | Status: DC

## 2019-06-08 MED ORDER — ALUM & MAG HYDROXIDE-SIMETH 200-200-20 MG/5ML PO SUSP: 15.0000 mL | ORAL | Status: DC | PRN

## 2019-06-08 MED ORDER — CELECOXIB 200 MG PO CAPS
200.0000 mg | ORAL_CAPSULE | Freq: Two times a day (BID) | ORAL | Status: DC
  Administered 2019-06-08 – 2019-06-09 (×2): 200 mg via ORAL
  Filled 2019-06-08 (×2): qty 1

## 2019-06-08 MED ORDER — POVIDONE-IODINE 10 % EX SWAB
2.0000 "application " | Freq: Once | CUTANEOUS | Status: AC
  Administered 2019-06-08: 2 via TOPICAL

## 2019-06-08 MED ORDER — ATENOLOL 25 MG PO TABS
25.0000 mg | ORAL_TABLET | Freq: Every day | ORAL | Status: DC
  Administered 2019-06-09: 25 mg via ORAL
  Filled 2019-06-08: qty 1

## 2019-06-08 MED ORDER — TRANEXAMIC ACID-NACL 1000-0.7 MG/100ML-% IV SOLN
1000.0000 mg | Freq: Once | INTRAVENOUS | Status: AC
  Administered 2019-06-08: 1000 mg via INTRAVENOUS
  Filled 2019-06-08: qty 100

## 2019-06-08 MED ORDER — FERROUS SULFATE 325 (65 FE) MG PO TABS
325.0000 mg | ORAL_TABLET | Freq: Three times a day (TID) | ORAL | Status: DC
  Administered 2019-06-08 – 2019-06-09 (×3): 325 mg via ORAL
  Filled 2019-06-08 (×3): qty 1

## 2019-06-08 MED ORDER — ACETAMINOPHEN 500 MG PO TABS
1000.0000 mg | ORAL_TABLET | Freq: Four times a day (QID) | ORAL | Status: DC
  Administered 2019-06-08 – 2019-06-09 (×3): 1000 mg via ORAL
  Filled 2019-06-08 (×3): qty 2

## 2019-06-08 MED ORDER — ONDANSETRON HCL 4 MG/2ML IJ SOLN
INTRAMUSCULAR | Status: AC
  Filled 2019-06-08: qty 2

## 2019-06-08 MED ORDER — SODIUM CHLORIDE 0.9 % IV SOLN: INTRAVENOUS | Status: DC

## 2019-06-08 MED ORDER — PROPOFOL 10 MG/ML IV BOLUS
INTRAVENOUS | Status: AC
  Filled 2019-06-08: qty 20

## 2019-06-08 MED ORDER — ONDANSETRON HCL 4 MG/2ML IJ SOLN
INTRAMUSCULAR | Status: DC | PRN
  Administered 2019-06-08: 4 mg via INTRAVENOUS

## 2019-06-08 MED ORDER — FENTANYL CITRATE (PF) 250 MCG/5ML IJ SOLN
INTRAMUSCULAR | Status: DC | PRN
  Administered 2019-06-08 (×5): 50 ug via INTRAVENOUS

## 2019-06-08 MED ORDER — DIPHENHYDRAMINE HCL 12.5 MG/5ML PO ELIX: 12.5000 mg | ORAL_SOLUTION | ORAL | Status: DC | PRN

## 2019-06-08 MED ORDER — HYDROMORPHONE HCL 1 MG/ML IJ SOLN
0.5000 mg | INTRAMUSCULAR | Status: DC | PRN
  Administered 2019-06-08: 1 mg via INTRAVENOUS
  Filled 2019-06-08: qty 1

## 2019-06-08 MED ORDER — DEXAMETHASONE SODIUM PHOSPHATE 10 MG/ML IJ SOLN
INTRAMUSCULAR | Status: AC
  Filled 2019-06-08: qty 1

## 2019-06-08 MED ORDER — METHOCARBAMOL 500 MG IVPB - SIMPLE MED
INTRAVENOUS | Status: AC
  Filled 2019-06-08: qty 50

## 2019-06-08 MED ORDER — TRAZODONE HCL 50 MG PO TABS
50.0000 mg | ORAL_TABLET | Freq: Every day | ORAL | Status: DC
  Administered 2019-06-08: 50 mg via ORAL
  Filled 2019-06-08: qty 1

## 2019-06-08 MED ORDER — METHOCARBAMOL 500 MG PO TABS
500.0000 mg | ORAL_TABLET | Freq: Four times a day (QID) | ORAL | Status: DC | PRN
  Administered 2019-06-08 – 2019-06-09 (×2): 500 mg via ORAL
  Filled 2019-06-08 (×2): qty 1

## 2019-06-08 MED ORDER — POLYETHYLENE GLYCOL 3350 17 G PO PACK
17.0000 g | PACK | Freq: Two times a day (BID) | ORAL | Status: DC
  Administered 2019-06-08: 17 g via ORAL
  Filled 2019-06-08 (×2): qty 1

## 2019-06-08 MED ORDER — RIVAROXABAN 10 MG PO TABS
10.0000 mg | ORAL_TABLET | ORAL | Status: DC
  Administered 2019-06-09: 10 mg via ORAL
  Filled 2019-06-08: qty 1

## 2019-06-08 MED ORDER — BISACODYL 10 MG RE SUPP: 10.0000 mg | Freq: Every day | RECTAL | Status: DC | PRN

## 2019-06-08 MED ORDER — METOCLOPRAMIDE HCL 5 MG PO TABS: 5.0000 mg | ORAL_TABLET | Freq: Three times a day (TID) | ORAL | Status: DC | PRN

## 2019-06-08 MED ORDER — VANCOMYCIN HCL IN DEXTROSE 1-5 GM/200ML-% IV SOLN
1000.0000 mg | INTRAVENOUS | Status: AC
  Administered 2019-06-08: 1000 mg via INTRAVENOUS
  Filled 2019-06-08: qty 200

## 2019-06-08 MED ORDER — DULOXETINE HCL 30 MG PO CPEP
30.0000 mg | ORAL_CAPSULE | Freq: Every day | ORAL | Status: DC
  Administered 2019-06-09: 30 mg via ORAL
  Filled 2019-06-08: qty 1

## 2019-06-08 MED ORDER — PROPOFOL 10 MG/ML IV BOLUS
INTRAVENOUS | Status: DC | PRN
  Administered 2019-06-08: 100 mg via INTRAVENOUS

## 2019-06-08 MED ORDER — METOCLOPRAMIDE HCL 5 MG/ML IJ SOLN: 5.0000 mg | Freq: Three times a day (TID) | INTRAMUSCULAR | Status: DC | PRN

## 2019-06-08 MED ORDER — BUSPIRONE HCL 5 MG PO TABS
10.0000 mg | ORAL_TABLET | Freq: Three times a day (TID) | ORAL | Status: DC
  Administered 2019-06-08 – 2019-06-09 (×3): 10 mg via ORAL
  Filled 2019-06-08 (×3): qty 2

## 2019-06-08 MED ORDER — DOCUSATE SODIUM 100 MG PO CAPS
100.0000 mg | ORAL_CAPSULE | Freq: Two times a day (BID) | ORAL | Status: DC
  Administered 2019-06-08 – 2019-06-09 (×2): 100 mg via ORAL
  Filled 2019-06-08 (×2): qty 1

## 2019-06-08 MED ORDER — DEXAMETHASONE SODIUM PHOSPHATE 10 MG/ML IJ SOLN
10.0000 mg | Freq: Once | INTRAMUSCULAR | Status: AC
  Administered 2019-06-09: 10 mg via INTRAVENOUS
  Filled 2019-06-08: qty 1

## 2019-06-08 MED ORDER — MAGNESIUM CITRATE PO SOLN: 1.0000 | Freq: Once | ORAL | Status: DC | PRN

## 2019-06-08 MED ORDER — PHENYLEPHRINE HCL-NACL 10-0.9 MG/250ML-% IV SOLN
INTRAVENOUS | Status: DC | PRN
  Administered 2019-06-08: 20 ug/min via INTRAVENOUS

## 2019-06-08 MED ORDER — OXYCODONE HCL 5 MG PO TABS
ORAL_TABLET | ORAL | Status: AC
  Filled 2019-06-08: qty 1

## 2019-06-08 MED ORDER — ONDANSETRON HCL 4 MG/2ML IJ SOLN: 4.0000 mg | Freq: Four times a day (QID) | INTRAMUSCULAR | Status: DC | PRN

## 2019-06-08 MED ORDER — DILTIAZEM HCL ER COATED BEADS 120 MG PO CP24
120.0000 mg | ORAL_CAPSULE | Freq: Every day | ORAL | Status: DC
  Administered 2019-06-09: 120 mg via ORAL
  Filled 2019-06-08 (×2): qty 1

## 2019-06-08 MED ORDER — OXYCODONE HCL 5 MG PO TABS
10.0000 mg | ORAL_TABLET | ORAL | Status: DC | PRN
  Administered 2019-06-08: 10 mg via ORAL
  Filled 2019-06-08: qty 2

## 2019-06-08 MED ORDER — ONDANSETRON HCL 4 MG PO TABS: 4.0000 mg | ORAL_TABLET | Freq: Four times a day (QID) | ORAL | Status: DC | PRN

## 2019-06-08 MED ORDER — VANCOMYCIN HCL IN DEXTROSE 1-5 GM/200ML-% IV SOLN
1000.0000 mg | Freq: Two times a day (BID) | INTRAVENOUS | Status: AC
  Administered 2019-06-08: 1000 mg via INTRAVENOUS
  Filled 2019-06-08: qty 200

## 2019-06-08 MED ORDER — DEXAMETHASONE SODIUM PHOSPHATE 10 MG/ML IJ SOLN
10.0000 mg | Freq: Once | INTRAMUSCULAR | Status: AC
  Administered 2019-06-08: 8 mg via INTRAVENOUS

## 2019-06-08 MED ORDER — DICYCLOMINE HCL 10 MG PO CAPS
10.0000 mg | ORAL_CAPSULE | Freq: Two times a day (BID) | ORAL | Status: DC
  Administered 2019-06-08 – 2019-06-09 (×2): 10 mg via ORAL
  Filled 2019-06-08 (×3): qty 1

## 2019-06-08 MED ORDER — 0.9 % SODIUM CHLORIDE (POUR BTL) OPTIME
TOPICAL | Status: DC | PRN
  Administered 2019-06-08: 1000 mL

## 2019-06-08 MED ORDER — FLUTICASONE PROPIONATE 50 MCG/ACT NA SUSP
2.0000 | Freq: Every day | NASAL | Status: DC | PRN
  Filled 2019-06-08: qty 16

## 2019-06-08 MED ORDER — SUGAMMADEX SODIUM 200 MG/2ML IV SOLN
INTRAVENOUS | Status: DC | PRN
  Administered 2019-06-08: 200 mg via INTRAVENOUS

## 2019-06-08 MED ORDER — LIDOCAINE 2% (20 MG/ML) 5 ML SYRINGE
INTRAMUSCULAR | Status: AC
  Filled 2019-06-08: qty 5

## 2019-06-08 MED ORDER — STERILE WATER FOR IRRIGATION IR SOLN
Status: DC | PRN
  Administered 2019-06-08: 2000 mL

## 2019-06-08 MED ORDER — PROMETHAZINE HCL 25 MG/ML IJ SOLN: 6.2500 mg | INTRAMUSCULAR | Status: DC | PRN

## 2019-06-08 MED ORDER — TRANEXAMIC ACID-NACL 1000-0.7 MG/100ML-% IV SOLN
1000.0000 mg | INTRAVENOUS | Status: AC
  Administered 2019-06-08: 1000 mg via INTRAVENOUS
  Filled 2019-06-08: qty 100

## 2019-06-08 MED ORDER — HYDROMORPHONE HCL 1 MG/ML IJ SOLN
INTRAMUSCULAR | Status: AC
  Filled 2019-06-08: qty 1

## 2019-06-08 MED ORDER — METHOCARBAMOL 500 MG IVPB - SIMPLE MED
500.0000 mg | Freq: Four times a day (QID) | INTRAVENOUS | Status: DC | PRN
  Administered 2019-06-08: 500 mg via INTRAVENOUS
  Filled 2019-06-08: qty 50

## 2019-06-08 MED ORDER — CHLORHEXIDINE GLUCONATE 4 % EX LIQD: 60.0000 mL | Freq: Once | CUTANEOUS | Status: DC

## 2019-06-08 MED ORDER — FENTANYL CITRATE (PF) 100 MCG/2ML IJ SOLN
25.0000 ug | INTRAMUSCULAR | Status: DC | PRN
  Administered 2019-06-08 (×3): 50 ug via INTRAVENOUS

## 2019-06-08 MED ORDER — ALBUMIN HUMAN 5 % IV SOLN: INTRAVENOUS | Status: DC | PRN

## 2019-06-08 MED ORDER — MENTHOL 3 MG MT LOZG: 1.0000 | LOZENGE | OROMUCOSAL | Status: DC | PRN

## 2019-06-08 MED ORDER — LIDOCAINE HCL (CARDIAC) PF 100 MG/5ML IV SOSY
PREFILLED_SYRINGE | INTRAVENOUS | Status: DC | PRN
  Administered 2019-06-08: 60 mg via INTRAVENOUS

## 2019-06-08 SURGICAL SUPPLY — 50 items
ADH SKN CLS APL DERMABOND .7 (GAUZE/BANDAGES/DRESSINGS) ×1
BAG DECANTER FOR FLEXI CONT (MISCELLANEOUS) IMPLANT
BAG SPEC THK2 15X12 ZIP CLS (MISCELLANEOUS)
BAG ZIPLOCK 12X15 (MISCELLANEOUS) IMPLANT
BLADE SAG 18X100X1.27 (BLADE) ×2 IMPLANT
BLADE SURG SZ10 CARB STEEL (BLADE) ×4 IMPLANT
COVER PERINEAL POST (MISCELLANEOUS) ×2 IMPLANT
COVER SURGICAL LIGHT HANDLE (MISCELLANEOUS) ×2 IMPLANT
COVER WAND RF STERILE (DRAPES) IMPLANT
CUP ACET PINNACLE SECTR 50MM (Hips) IMPLANT
DERMABOND ADVANCED (GAUZE/BANDAGES/DRESSINGS) ×1
DERMABOND ADVANCED .7 DNX12 (GAUZE/BANDAGES/DRESSINGS) ×1 IMPLANT
DRAPE STERI IOBAN 125X83 (DRAPES) ×2 IMPLANT
DRAPE U-SHAPE 47X51 STRL (DRAPES) ×4 IMPLANT
DRESSING AQUACEL AG SP 3.5X10 (GAUZE/BANDAGES/DRESSINGS) ×1 IMPLANT
DRSG AQUACEL AG SP 3.5X10 (GAUZE/BANDAGES/DRESSINGS) ×2
DURAPREP 26ML APPLICATOR (WOUND CARE) ×2 IMPLANT
ELECT REM PT RETURN 15FT ADLT (MISCELLANEOUS) ×2 IMPLANT
ELIMINATOR HOLE APEX DEPUY (Hips) ×1 IMPLANT
GLOVE BIO SURGEON STRL SZ 6 (GLOVE) ×4 IMPLANT
GLOVE BIOGEL PI IND STRL 6.5 (GLOVE) ×1 IMPLANT
GLOVE BIOGEL PI IND STRL 7.5 (GLOVE) ×1 IMPLANT
GLOVE BIOGEL PI IND STRL 8.5 (GLOVE) ×1 IMPLANT
GLOVE BIOGEL PI INDICATOR 6.5 (GLOVE) ×1
GLOVE BIOGEL PI INDICATOR 7.5 (GLOVE) ×1
GLOVE BIOGEL PI INDICATOR 8.5 (GLOVE) ×1
GLOVE ECLIPSE 8.0 STRL XLNG CF (GLOVE) ×4 IMPLANT
GLOVE ORTHO TXT STRL SZ7.5 (GLOVE) ×4 IMPLANT
GOWN STRL REUS W/TWL LRG LVL3 (GOWN DISPOSABLE) ×4 IMPLANT
GOWN STRL REUS W/TWL XL LVL3 (GOWN DISPOSABLE) ×2 IMPLANT
HEAD FEM STD 32X+1 STRL (Hips) ×1 IMPLANT
HOLDER FOLEY CATH W/STRAP (MISCELLANEOUS) ×2 IMPLANT
KIT TURNOVER KIT A (KITS) IMPLANT
LINER ACET PNNCL PLUS4 NEUTRAL (Hips) IMPLANT
PACK ANTERIOR HIP CUSTOM (KITS) ×2 IMPLANT
PENCIL SMOKE EVACUATOR (MISCELLANEOUS) ×1 IMPLANT
PINNACLE PLUS 4 NEUTRAL (Hips) ×2 IMPLANT
PINNACLE SECTOR CUP 50MM (Hips) ×2 IMPLANT
SCREW 6.5MMX30MM (Screw) ×1 IMPLANT
SLEEVE SUCTION 125 (MISCELLANEOUS) ×1 IMPLANT
STEM FEM ACTIS HIGH SZ3 (Stem) ×1 IMPLANT
SUT MNCRL AB 4-0 PS2 18 (SUTURE) ×2 IMPLANT
SUT STRATAFIX 0 PDS 27 VIOLET (SUTURE) ×2
SUT VIC AB 1 CT1 36 (SUTURE) ×6 IMPLANT
SUT VIC AB 2-0 CT1 27 (SUTURE) ×4
SUT VIC AB 2-0 CT1 TAPERPNT 27 (SUTURE) ×2 IMPLANT
SUTURE STRATFX 0 PDS 27 VIOLET (SUTURE) ×1 IMPLANT
TRAY FOLEY MTR SLVR 16FR STAT (SET/KITS/TRAYS/PACK) IMPLANT
WATER STERILE IRR 1000ML POUR (IV SOLUTION) ×2 IMPLANT
YANKAUER SUCT BULB TIP 10FT TU (MISCELLANEOUS) ×1 IMPLANT

## 2019-06-08 NOTE — Op Note (Signed)
NAME:  Kaitlyn Serrano                ACCOUNT NO.: [id number]      MEDICAL RECORD NO.: [id number]      FACILITY:  [facility]      PHYSICIAN:  Mauri Pole  DATE OF BIRTH:  [birth year]     DATE OF PROCEDURE:  06/08/2019                                 OPERATIVE REPORT         PREOPERATIVE DIAGNOSIS: Right  hip osteoarthritis.      POSTOPERATIVE DIAGNOSIS:  Right hip osteoarthritis.      PROCEDURE:  Right total hip replacement through an anterior approach   utilizing DePuy THR system, component size 50 mm pinnacle cup, a size 32+4 neutral   Altrex liner, a size 3 Hi Actis stem with a 32+1 Articuleze metal head ball.      SURGEON:  Pietro Cassis. Alvan Dame, M.D.      ASSISTANT:  Danae Orleans, PA-C     ANESTHESIA:  General.      SPECIMENS:  None.      COMPLICATIONS:  None.      BLOOD LOSS:  550 cc     DRAINS:  None.      INDICATION OF THE PROCEDURE:  Kaitlyn Serrano is a 79 y.o. female who had   presented to office for evaluation of right hip pain.  Radiographs revealed   progressive degenerative changes with bone-on-bone   articulation of the  hip joint, including subchondral cystic changes and osteophytes.  The patient had painful limited range of   motion significantly affecting their overall quality of life and function.  The patient was failing to    respond to conservative measures including medications and/or injections and activity modification and at this point was ready   to proceed with more definitive measures.  Consent was obtained for   benefit of pain relief.  Specific risks of infection, DVT, component   failure, dislocation, neurovascular injury, and need for revision surgery were reviewed in the office as well discussion of   the anterior versus posterior approach were reviewed.     PROCEDURE IN DETAIL:  The patient was brought to operative theater.   Once adequate anesthesia, preoperative antibiotics, 2 gm of Ancef, 1 gm of Tranexamic Acid,  and 10 mg of Decadron were administered, the patient was positioned supine on the [employer] table.  Once the patient was safely positioned with adequate padding of boney prominences we predraped out the hip, and used fluoroscopy to confirm orientation of the pelvis.      The right hip was then prepped and draped from proximal iliac crest to   mid thigh with a shower curtain technique.      Time-out was performed identifying the patient, planned procedure, and the appropriate extremity.     An incision was then made 2 cm lateral to the   anterior superior iliac spine extending over the orientation of the   tensor fascia lata muscle and sharp dissection was carried down to the   fascia of the muscle.      The fascia was then incised.  The muscle belly was identified and swept   laterally and retractor placed along the superior neck.  Following   cauterization of the circumflex vessels and removing some pericapsular  fat, a second cobra retractor was placed on the inferior neck.  A T-capsulotomy was made along the line of the   superior neck to the trochanteric fossa, then extended proximally and   distally.  Tag sutures were placed and the retractors were then placed   intracapsular.  We then identified the trochanteric fossa and   orientation of my neck cut and then made a neck osteotomy with the femur on traction.  The femoral   head was removed without difficulty or complication.  Traction was let   off and retractors were placed posterior and anterior around the   acetabulum.      The labrum and foveal tissue were debrided.  I began reaming with a 45 mm   reamer and reamed up to 49 mm reamer with good bony bed preparation and a 50 mm  cup was chosen.  The final 50 mm Pinnacle cup was then impacted under fluoroscopy to confirm the depth of penetration and orientation with respect to   Abduction and forward flexion.  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  A screw was placed into the ilium followed by the hole eliminator.  The final   32+4 neutral Altrex liner was impacted with good visualized rim fit.  The cup was positioned anatomically within the acetabular portion of the pelvis.      At this point, the femur was rolled to 100 degrees.  Further capsule was   released off the inferior aspect of the femoral neck.  I then   released the superior capsule proximally.  With the leg in a neutral position the hook was placed laterally   along the femur under the vastus lateralis origin and elevated manually and then held in position using the hook attachment on the bed.  The leg was then extended and adducted with the leg rolled to 100   degrees of external rotation.  Retractors were placed along the medial calcar and posteriorly over the greater trochanter.  Once the proximal femur was fully   exposed, I used a box osteotome to set orientation.  I then began   broaching with the starting chili pepper broach and passed this by hand and then broached up to 3.  With the 3 broach in place I chose a high offset neck and did several trial reductions.  The offset was appropriate, leg lengths   appeared to be equal best matched with the +1 head ball trial confirmed radiographically.   Given these findings, I went ahead and dislocated the hip, repositioned all   retractors and positioned the right hip in the extended and abducted position.  The final 3 Hi Actis stem was   chosen and it was impacted down to the level of neck cut.  Based on this   and the trial reductions, a final 32+1 Articuleze metal  ball was chosen and   impacted onto a clean and dry trunnion, and the hip was reduced.  The   hip had been irrigated throughout the case again at this point.  I did   reapproximate the superior capsular leaflet to the anterior leaflet   using #1  Vicryl.  The fascia of the   tensor fascia lata muscle was then reapproximated using #1 Vicryl and #0 Stratafix sutures.  The   remaining wound was closed  with 2-0 Vicryl and running 4-0 Monocryl.   The hip was cleaned, dried, and dressed sterilely using Dermabond and   Aquacel dressing.  The patient was then brought   to recovery room in stable condition tolerating the procedure well.    Danae Orleans, PA-C was present for the entirety of the case involved from   preoperative positioning, perioperative retractor management, general   facilitation of the case, as well as primary wound closure as assistant.            Pietro Cassis Alvan Dame, M.D.        06/08/2019 12:00 PM

## 2019-06-08 NOTE — Care Plan (Signed)
Ortho Bundle Case Management Note  Patient Details  Name: Kaitlyn Serrano MRN: [id number] Date of Birth: 01-16-1941  R THA on 06-08-19 DCP:  Home with dtr.  1 story home with 0 ste. DME:  No needs.  Has a RW and 3-in-1. PT:  HEP                   DME Arranged:  N/A DME Agency:  [employer]  HH Arranged:  NA HH Agency:  NA  Additional Comments: Please contact me with any questions of if this plan should need to change.  Marianne Sofia, RN,CCM [facility]  [phone] 06/08/2019, 11:50 AM

## 2019-06-08 NOTE — Discharge Instructions (Addendum)
INSTRUCTIONS AFTER JOINT REPLACEMENT   o Remove items at home which could result in a fall. This includes throw rugs or furniture in walking pathways o ICE to the affected joint every three hours while awake for 30 minutes at a time, for at least the first 3-5 days, and then as needed for pain and swelling.  Continue to use ice for pain and swelling. You may notice swelling that will progress down to the foot and ankle.  This is normal after surgery.  Elevate your leg when you are not up walking on it.   o Continue to use the breathing machine you got in the hospital (incentive spirometer) which will help keep your temperature down.  It is common for your temperature to cycle up and down following surgery, especially at night when you are not up moving around and exerting yourself.  The breathing machine keeps your lungs expanded and your temperature down.   DIET:  As you were doing prior to hospitalization, we recommend a well-balanced diet.  DRESSING / WOUND CARE / SHOWERING  Keep the surgical dressing until follow up.  The dressing is water proof, so you can shower without any extra covering.  IF THE DRESSING FALLS OFF or the wound gets wet inside, change the dressing with sterile gauze.  Please use good hand washing techniques before changing the dressing.  Do not use any lotions or creams on the incision until instructed by your surgeon.    ACTIVITY  o Increase activity slowly as tolerated, but follow the weight bearing instructions below.   o No driving for 6 weeks or until further direction given by your physician.  You cannot drive while taking narcotics.  o No lifting or carrying greater than 10 lbs. until further directed by your surgeon. o Avoid periods of inactivity such as sitting longer than an hour when not asleep. This helps prevent blood clots.  o You may return to work once you are authorized by your doctor.     WEIGHT BEARING   Weight bearing as tolerated with assist  device (walker, cane, etc) as directed, use it as long as suggested by your surgeon or therapist, typically at least 4-6 weeks.   EXERCISES  Results after joint replacement surgery are often greatly improved when you follow the exercise, range of motion and muscle strengthening exercises prescribed by your doctor. Safety measures are also important to protect the joint from further injury. Any time any of these exercises cause you to have increased pain or swelling, decrease what you are doing until you are comfortable again and then slowly increase them. If you have problems or questions, call your caregiver or physical therapist for advice.   Rehabilitation is important following a joint replacement. After just a few days of immobilization, the muscles of the leg can become weakened and shrink (atrophy).  These exercises are designed to build up the tone and strength of the thigh and leg muscles and to improve motion. Often times heat used for twenty to thirty minutes before working out will loosen up your tissues and help with improving the range of motion but do not use heat for the first two weeks following surgery (sometimes heat can increase post-operative swelling).   These exercises can be done on a training (exercise) mat, on the floor, on a table or on a bed. Use whatever works the best and is most comfortable for you.    Use music or television while you are exercising so that   the exercises are a pleasant break in your day. This will make your life better with the exercises acting as a break in your routine that you can look forward to.   Perform all exercises about fifteen times, three times per day or as directed.  You should exercise both the operative leg and the other leg as well.  Exercises include:   . Quad Sets - Tighten up the muscle on the front of the thigh (Quad) and hold for 5-10 seconds.   . Straight Leg Raises - With your knee straight (if you were given a brace, keep it on),  lift the leg to 60 degrees, hold for 3 seconds, and slowly lower the leg.  Perform this exercise against resistance later as your leg gets stronger.  . Leg Slides: Lying on your back, slowly slide your foot toward your buttocks, bending your knee up off the floor (only go as far as is comfortable). Then slowly slide your foot back down until your leg is flat on the floor again.  . Angel Wings: Lying on your back spread your legs to the side as far apart as you can without causing discomfort.  . Hamstring Strength:  Lying on your back, push your heel against the floor with your leg straight by tightening up the muscles of your buttocks.  Repeat, but this time bend your knee to a comfortable angle, and push your heel against the floor.  You may put a pillow under the heel to make it more comfortable if necessary.   A rehabilitation program following joint replacement surgery can speed recovery and prevent re-injury in the future due to weakened muscles. Contact your doctor or a physical therapist for more information on knee rehabilitation.    CONSTIPATION  Constipation is defined medically as fewer than three stools per week and severe constipation as less than one stool per week.  Even if you have a regular bowel pattern at home, your normal regimen is likely to be disrupted due to multiple reasons following surgery.  Combination of anesthesia, postoperative narcotics, change in appetite and fluid intake all can affect your bowels.   YOU MUST use at least one of the following options; they are listed in order of increasing strength to get the job done.  They are all available over the counter, and you may need to use some, POSSIBLY even all of these options:    Drink plenty of fluids (prune juice may be helpful) and high fiber foods Colace 100 mg by mouth twice a day  Senokot for constipation as directed and as needed Dulcolax (bisacodyl), take with full glass of water  Miralax (polyethylene glycol)  once or twice a day as needed.  If you have tried all these things and are unable to have a bowel movement in the first 3-4 days after surgery call either your surgeon or your primary doctor.    If you experience loose stools or diarrhea, hold the medications until you stool forms back up.  If your symptoms do not get better within 1 week or if they get worse, check with your doctor.  If you experience "the worst abdominal pain ever" or develop nausea or vomiting, please contact the office immediately for further recommendations for treatment.   ITCHING:  If you experience itching with your medications, try taking only a single pain pill, or even half a pain pill at a time.  You can also use Benadryl over the counter for itching or also to   help with sleep.   TED HOSE STOCKINGS:  Use stockings on both legs until for at least 2 weeks or as directed by physician office. They may be removed at night for sleeping.  MEDICATIONS:  See your medication summary on the "After Visit Summary" that nursing will review with you.  You may have some home medications which will be placed on hold until you complete the course of blood thinner medication.  It is important for you to complete the blood thinner medication as prescribed.  PRECAUTIONS:  If you experience chest pain or shortness of breath - call 911 immediately for transfer to the hospital emergency department.   If you develop a fever greater that 101 F, purulent drainage from wound, increased redness or drainage from wound, foul odor from the wound/dressing, or calf pain - CONTACT YOUR SURGEON.                                                   FOLLOW-UP APPOINTMENTS:  If you do not already have a post-op appointment, please call the office for an appointment to be seen by your surgeon.  Guidelines for how soon to be seen are listed in your "After Visit Summary", but are typically between 1-4 weeks after surgery.  OTHER INSTRUCTIONS:   Knee  Replacement:  Do not place pillow under knee, focus on keeping the knee straight while resting.    DENTAL ANTIBIOTICS:  In most cases prophylactic antibiotics for Dental procdeures after total joint surgery are not necessary.  Exceptions are as follows:  1. History of prior total joint infection  2. Severely immunocompromised (Organ Transplant, cancer chemotherapy, Rheumatoid biologic meds such as Humera)  3. Poorly controlled diabetes (A1C &gt; 8.0, blood glucose over 200)  If you have one of these conditions, contact your surgeon for an antibiotic prescription, prior to your dental procedure.   MAKE SURE YOU:  . Understand these instructions.  . Get help right away if you are not doing well or get worse.    Thank you for letting us be a part of your medical care team.  It is a privilege we respect greatly.  We hope these instructions will help you stay on track for a fast and full recovery!    Information on my medicine - XARELTO (Rivaroxaban)  This medication education was reviewed with me or my healthcare representative as part of my discharge preparation.  The pharmacist that spoke with me during my hospital stay was:    Why was Xarelto prescribed for you? Xarelto was prescribed for you to reduce the risk of blood clots forming after orthopedic surgery. The medical term for these abnormal blood clots is venous thromboembolism (VTE).  What do you need to know about xarelto ? Take your Xarelto ONCE DAILY at the same time every day. You may take it either with or without food.  If you have difficulty swallowing the tablet whole, you may crush it and mix in applesauce just prior to taking your dose.  Take Xarelto exactly as prescribed by your doctor and DO NOT stop taking Xarelto without talking to the doctor who prescribed the medication.  Stopping without other VTE prevention medication to take the place of Xarelto may increase your risk of developing a  clot.  After discharge, you should have regular check-up appointments with your healthcare provider   that is prescribing your Xarelto.    What do you do if you miss a dose? If you miss a dose, take it as soon as you remember on the same day then continue your regularly scheduled once daily regimen the next day. Do not take two doses of Xarelto on the same day.   Important Safety Information A possible side effect of Xarelto is bleeding. You should call your healthcare provider right away if you experience any of the following: ? Bleeding from an injury or your nose that does not stop. ? Unusual colored urine (red or dark brown) or unusual colored stools (red or black). ? Unusual bruising for unknown reasons. ? A serious fall or if you hit your head (even if there is no bleeding).  Some medicines may interact with Xarelto and might increase your risk of bleeding while on Xarelto. To help avoid this, consult your healthcare provider or pharmacist prior to using any new prescription or non-prescription medications, including herbals, vitamins, non-steroidal anti-inflammatory drugs (NSAIDs) and supplements.  This website has more information on Xarelto: www.xarelto.com.    

## 2019-06-08 NOTE — Anesthesia Preprocedure Evaluation (Addendum)
Anesthesia Evaluation  Patient identified by MRN, date of birth, ID band Patient awake    Reviewed: Allergy & Precautions, NPO status , Patient's Chart, lab work & pertinent test results, reviewed documented beta blocker date and time   Airway Mallampati: II  TM Distance: >3 FB Neck ROM: Full    Dental  (+) Dental Advisory Given   Pulmonary former smoker,    breath sounds clear to auscultation       Cardiovascular hypertension, Pt. on medications and Pt. on home beta blockers  Rhythm:Regular Rate:Normal     Neuro/Psych Anxiety Depression negative neurological ROS     GI/Hepatic Neg liver ROS, GERD  ,  Endo/Other  negative endocrine ROS  Renal/GU Renal disease     Musculoskeletal  (+) Arthritis ,   Abdominal   Peds  Hematology negative hematology ROS (+)   Anesthesia Other Findings   Reproductive/Obstetrics                             Lab Results  Component Value Date   WBC 9.3 06/04/2019   HGB 14.4 06/04/2019   HCT 43.0 06/04/2019   MCV 92.3 06/04/2019   PLT 270 06/04/2019   Lab Results  Component Value Date   CREATININE 0.71 06/04/2019   BUN 14 06/04/2019   NA 140 06/04/2019   K 5.3 (H) 06/04/2019   CL 104 06/04/2019   CO2 29 06/04/2019    Anesthesia Physical Anesthesia Plan  ASA: III  Anesthesia Plan: General   Post-op Pain Management:    Induction: Intravenous  PONV Risk Score and Plan: 3 and Dexamethasone, Ondansetron and Treatment may vary due to age or medical condition  Airway Management Planned: Oral ETT  Additional Equipment:   Intra-op Plan:   Post-operative Plan: Extubation in OR  Informed Consent: I have reviewed the patients History and Physical, chart, labs and discussed the procedure including the risks, benefits and alternatives for the proposed anesthesia with the patient or authorized representative who has indicated his/her understanding and  acceptance.     Dental advisory given  Plan Discussed with: CRNA  Anesthesia Plan Comments:        Anesthesia Quick Evaluation

## 2019-06-08 NOTE — Anesthesia Postprocedure Evaluation (Signed)
Anesthesia Post Note  Patient: Kaitlyn Serrano  Procedure(s) Performed: TOTAL HIP ARTHROPLASTY ANTERIOR APPROACH (Right Hip)     Patient location during evaluation: PACU Anesthesia Type: General Level of consciousness: awake and alert Pain management: pain level controlled Vital Signs Assessment: post-procedure vital signs reviewed and stable Respiratory status: spontaneous breathing, nonlabored ventilation, respiratory function stable and patient connected to nasal cannula oxygen Cardiovascular status: blood pressure returned to baseline and stable Postop Assessment: no apparent nausea or vomiting Anesthetic complications: no    Last Vitals:  Vitals:   06/08/19 1330 06/08/19 1345  BP: (!) 189/75 (!) 160/97  Pulse: 67 66  Resp: (!) 21 16  Temp:    SpO2: 100% 100%    Last Pain:  Vitals:   06/08/19 1345  TempSrc:   PainSc: Tyler Deis

## 2019-06-08 NOTE — Anesthesia Procedure Notes (Signed)
Procedure Name: Intubation Date/Time: 06/08/2019 10:48 AM Performed by: Glory Buff, CRNA Pre-anesthesia Checklist: Patient identified, Emergency Drugs available, Suction available and Patient being monitored Patient Re-evaluated:Patient Re-evaluated prior to induction Oxygen Delivery Method: Circle system utilized Preoxygenation: Pre-oxygenation with 100% oxygen Induction Type: IV induction Ventilation: Mask ventilation without difficulty Laryngoscope Size: Miller and 3 Grade View: Grade I Tube type: Oral Tube size: 7.0 mm Number of attempts: 1 Airway Equipment and Method: Stylet and Oral airway Placement Confirmation: ETT inserted through vocal cords under direct vision,  positive ETCO2 and breath sounds checked- equal and bilateral Secured at: 20 cm Tube secured with: Tape Dental Injury: Teeth and Oropharynx as per pre-operative assessment

## 2019-06-08 NOTE — Evaluation (Signed)
Physical Therapy Evaluation Patient Details Name: Kaitlyn Serrano MRN: [id number] DOB: [birth year] Today's Date: 06/08/2019   History of Present Illness  R AA-THA  Clinical Impression  Pt is s/p THA resulting in the deficits listed below (see PT Problem List). Pt ambulated 43' with RW, no loss of balance. Initiated THA HEP. Good progress expected.  Pt will benefit from skilled PT to increase their independence and safety with mobility to allow discharge to the venue listed below.      Follow Up Recommendations Follow surgeon's recommendation for DC plan and follow-up therapies    Equipment Recommendations  Rolling walker with 5" wheels    Recommendations for Other Services       Precautions / Restrictions Precautions Precautions: Fall Restrictions Weight Bearing Restrictions: No Other Position/Activity Restrictions: WBAT      Mobility  Bed Mobility Overal bed mobility: Needs Assistance Bed Mobility: Supine to Sit     Supine to sit: Min assist     General bed mobility comments: min A to raise trunk, VCs hand placement  Transfers Overall transfer level: Needs assistance Equipment used: Rolling walker (2 wheeled) Transfers: Sit to/from Stand           General transfer comment: min A to power up, VCs hand placement  Ambulation/Gait Ambulation/Gait assistance: Min guard Gait Distance (Feet): 38 Feet Assistive device: Rolling walker (2 wheeled) Gait Pattern/deviations: Step-to pattern;Decreased step length - right;Decreased step length - left Gait velocity: decr   General Gait Details: VCs sequencing, distance limited by pain  Stairs            Wheelchair Mobility    Modified Rankin (Stroke Patients Only)       Balance Overall balance assessment: Modified Independent                                           Pertinent Vitals/Pain Pain Assessment: Faces Faces Pain Scale: Hurts whole lot Pain Location: R hip Pain Descriptors /  Indicators: Grimacing;Guarding;Sore Pain Intervention(s): Limited activity within patient's tolerance;Monitored during session;Premedicated before session;Ice applied    Home Living Family/patient expects to be discharged to:: Private residence Living Arrangements: Children Available Help at Discharge: Family   Home Access: Level entry     Home Layout: One level Home Equipment: Environmental consultant - 4 wheels;Cane - single point Additional Comments: lives with daughter who is retired Naval architect Level of Independence: Independent with assistive device(s)         Comments: independent ADLs, walked with rollator     Hand Dominance        Extremity/Trunk Assessment   Upper Extremity Assessment Upper Extremity Assessment: Overall WFL for tasks assessed    Lower Extremity Assessment Lower Extremity Assessment: RLE deficits/detail RLE Deficits / Details: hip +2/5 limited by pain RLE Sensation: WNL RLE Coordination: WNL    Cervical / Trunk Assessment Cervical / Trunk Assessment: Normal  Communication   Communication: No difficulties  Cognition Arousal/Alertness: Awake/alert Behavior During Therapy: WFL for tasks assessed/performed Overall Cognitive Status: Within Functional Limits for tasks assessed                                        General Comments      Exercises Total Joint Exercises Ankle Circles/Pumps: AROM;Both;10 reps;Supine Heel Slides:  AAROM;Right;10 reps;Supine Hip ABduction/ADduction: AAROM;Right;10 reps;Supine   Assessment/Plan    PT Assessment Patient needs continued PT services  PT Problem List Decreased strength;Decreased range of motion;Decreased activity tolerance;Decreased mobility;Pain;Decreased knowledge of use of DME       PT Treatment Interventions DME instruction;Gait training;Functional mobility training;Therapeutic exercise;Therapeutic activities;Patient/family education    PT Goals (Current goals can be found in  the Care Plan section)  Acute Rehab PT Goals Patient Stated Goal: to walk farther PT Goal Formulation: With patient Time For Goal Achievement: 06/15/19 Potential to Achieve Goals: Good    Frequency 7X/week   Barriers to discharge        Co-evaluation               AM-PAC PT "6 Clicks" Mobility  Outcome Measure Help needed turning from your back to your side while in a flat bed without using bedrails?: A Little Help needed moving from lying on your back to sitting on the side of a flat bed without using bedrails?: A Little Help needed moving to and from a bed to a chair (including a wheelchair)?: A Little Help needed standing up from a chair using your arms (e.g., wheelchair or bedside chair)?: A Little Help needed to walk in hospital room?: A Little Help needed climbing 3-5 steps with a railing? : A Lot 6 Click Score: 17    End of Session Equipment Utilized During Treatment: Gait belt Activity Tolerance: Patient tolerated treatment well Patient left: in chair;with call bell/phone within reach;with chair alarm set Nurse Communication: Mobility status PT Visit Diagnosis: Difficulty in walking, not elsewhere classified (R26.2);Pain Pain - Right/Left: Right Pain - part of body: Hip    Time: [id number] PT Time Calculation (min) (ACUTE ONLY): 28 min   Charges:   PT Evaluation $PT Eval Low Complexity: 1 Low PT Treatments $Gait Training: 8-22 mins       Blondell Reveal Kistler PT 06/08/2019  Acute Rehabilitation Services Pager [phone] Office [phone]

## 2019-06-08 NOTE — Plan of Care (Signed)

## 2019-06-08 NOTE — Interval H&P Note (Signed)
History and Physical Interval Note:  06/08/2019 9:51 AM  Kaitlyn Serrano  has presented today for surgery, with the diagnosis of Right hip osteoarthritis.  The various methods of treatment have been discussed with the patient and family. After consideration of risks, benefits and other options for treatment, the patient has consented to  Procedure(s) with comments: TOTAL HIP ARTHROPLASTY ANTERIOR APPROACH (Right) - 70 mins as a surgical intervention.  The patient's history has been reviewed, patient examined, no change in status, stable for surgery.  I have reviewed the patient's chart and labs.  Questions were answered to the patient's satisfaction.     Mauri Pole

## 2019-06-08 NOTE — Transfer of Care (Signed)
Immediate Anesthesia Transfer of Care Note  Patient: Kaitlyn Serrano  Procedure(s) Performed: TOTAL HIP ARTHROPLASTY ANTERIOR APPROACH (Right Hip)  Patient Location: PACU  Anesthesia Type:General  Level of Consciousness: drowsy, patient cooperative and responds to stimulation  Airway & Oxygen Therapy: Patient Spontanous Breathing and Patient connected to face mask oxygen  Post-op Assessment: Report given to RN and Post -op Vital signs reviewed and stable  Post vital signs: Reviewed and stable  Last Vitals:  Vitals Value Taken Time  BP 194/179 06/08/19 1232  Temp    Pulse 71 06/08/19 1233  Resp 18 06/08/19 1233  SpO2 100 % 06/08/19 1233  Vitals shown include unvalidated device data.  Last Pain:  Vitals:   06/08/19 0910  TempSrc:   PainSc: 6       Patients Stated Pain Goal: 3 ([id number])  Complications: No apparent anesthesia complications

## 2019-06-09 ENCOUNTER — Encounter: Payer: Self-pay | Admitting: *Deleted

## 2019-06-09 DIAGNOSIS — I2510 Atherosclerotic heart disease of native coronary artery without angina pectoris: Secondary | ICD-10-CM | POA: Diagnosis not present

## 2019-06-09 DIAGNOSIS — K219 Gastro-esophageal reflux disease without esophagitis: Secondary | ICD-10-CM | POA: Diagnosis not present

## 2019-06-09 DIAGNOSIS — E663 Overweight: Secondary | ICD-10-CM | POA: Diagnosis not present

## 2019-06-09 DIAGNOSIS — I110 Hypertensive heart disease with heart failure: Secondary | ICD-10-CM | POA: Diagnosis not present

## 2019-06-09 DIAGNOSIS — G894 Chronic pain syndrome: Secondary | ICD-10-CM | POA: Diagnosis not present

## 2019-06-09 DIAGNOSIS — F411 Generalized anxiety disorder: Secondary | ICD-10-CM | POA: Diagnosis not present

## 2019-06-09 DIAGNOSIS — E7800 Pure hypercholesterolemia, unspecified: Secondary | ICD-10-CM | POA: Diagnosis not present

## 2019-06-09 DIAGNOSIS — I10 Essential (primary) hypertension: Secondary | ICD-10-CM | POA: Diagnosis not present

## 2019-06-09 DIAGNOSIS — M1611 Unilateral primary osteoarthritis, right hip: Secondary | ICD-10-CM | POA: Diagnosis not present

## 2019-06-09 LAB — BASIC METABOLIC PANEL
Anion gap: 9 (ref 5–15)
BUN: 10 mg/dL (ref 8–23)
CO2: 23 mmol/L (ref 22–32)
Calcium: 8.6 mg/dL — ABNORMAL LOW (ref 8.9–10.3)
Chloride: 101 mmol/L (ref 98–111)
Creatinine, Ser: 0.75 mg/dL (ref 0.44–1.00)
GFR calc Af Amer: >60 mL/min (ref >60)
GFR calc non Af Amer: >60 mL/min (ref >60)
Glucose, Bld: 171 mg/dL — ABNORMAL HIGH (ref 70–99)
Potassium: 4.1 mmol/L (ref 3.5–5.1)
Sodium: 133 mmol/L — ABNORMAL LOW (ref 135–145)

## 2019-06-09 LAB — CBC
HCT: 29.7 % — ABNORMAL LOW (ref 36.0–46.0)
Hemoglobin: 10 g/dL — ABNORMAL LOW (ref 12.0–15.0)
MCH: 31 pg (ref 26.0–34.0)
MCHC: 33.7 g/dL (ref 30.0–36.0)
MCV: 92 fL (ref 80.0–100.0)
Platelets: 199 10*3/uL (ref 150–400)
RBC: 3.23 MIL/uL — ABNORMAL LOW (ref 3.87–5.11)
RDW: 12.2 % (ref 11.5–15.5)
WBC: 17.1 10*3/uL — ABNORMAL HIGH (ref 4.0–10.5)
nRBC: 0 % (ref 0.0–0.2)

## 2019-06-09 MED ORDER — ACETAMINOPHEN 500 MG PO TABS: 1000.0000 mg | ORAL_TABLET | Freq: Three times a day (TID) | ORAL | 0 refills | Status: DC

## 2019-06-09 MED ORDER — POLYETHYLENE GLYCOL 3350 17 G PO PACK: 17.0000 g | PACK | Freq: Two times a day (BID) | ORAL | 0 refills | Status: DC

## 2019-06-09 MED ORDER — DOCUSATE SODIUM 100 MG PO CAPS: 100.0000 mg | ORAL_CAPSULE | Freq: Two times a day (BID) | ORAL | 0 refills | Status: DC

## 2019-06-09 MED ORDER — OXYCODONE HCL 5 MG PO TABS: 5.0000 mg | ORAL_TABLET | ORAL | 0 refills | Status: DC | PRN

## 2019-06-09 MED ORDER — FERROUS SULFATE 325 (65 FE) MG PO TABS: 325.0000 mg | ORAL_TABLET | Freq: Three times a day (TID) | ORAL | 0 refills | Status: DC

## 2019-06-09 MED ORDER — RIVAROXABAN 10 MG PO TABS: 10.0000 mg | ORAL_TABLET | Freq: Every day | ORAL | 0 refills | Status: DC

## 2019-06-09 NOTE — Plan of Care (Signed)
  Problem: Education: Goal: Knowledge of General Education information will improve Description: Including pain rating scale, medication(s)/side effects and non-pharmacologic comfort measures Outcome: Progressing   Problem: Coping: Goal: Level of anxiety will decrease Outcome: Progressing   Problem: Pain Managment: Goal: General experience of comfort will improve Outcome: Progressing   Problem: Education: Goal: Knowledge of the prescribed therapeutic regimen will improve Outcome: Progressing

## 2019-06-09 NOTE — Progress Notes (Addendum)
     Subjective: 1 Day Post-Op Procedure(s) (LRB): TOTAL HIP ARTHROPLASTY ANTERIOR APPROACH (Right)   Patient reports pain as mild, pain controlled medication.  No reported events throughout the night.  We discussed the procedure, findings and expectations moving forward.  Is ready to be discharged home, she does well with therapy.  Patient will follow-up in the clinic in 2 weeks.  Patient knows to call with any questions or concerns.     Objective:   VITALS:   Vitals:   06/09/19 0147 06/09/19 0607  BP: 126/80 102/70  Pulse: 84 85  Resp: 18 18  Temp: 98.1 F (36.7 C) 97.7 F (36.5 C)  SpO2: 99% 96%    Dorsiflexion/Plantar flexion intact Incision: dressing C/D/I No cellulitis present Compartment soft  LABS Recent Labs    06/09/19 0347  HGB 10.0*  HCT 29.7*  WBC 17.1*  PLT 199    Recent Labs    06/09/19 0347  NA 133*  K 4.1  BUN 10  CREATININE 0.75  GLUCOSE 171*     Assessment/Plan: 1 Day Post-Op Procedure(s) (LRB): TOTAL HIP ARTHROPLASTY ANTERIOR APPROACH (Right) Foley cath d/c'ed Advance diet Up with therapy D/C IV fluids Discharge home Follow up in 2 weeks at [facility] Follow up with OLIN,[place] Hoback D in 2 weeks.  Contact information:  [facility] [address] [place] [id number]    Overweight (BMI 25-29.9) Estimated body mass index is 25.24 kg/m as calculated from the following:   Height as of this encounter: 5\' 2"  (1.575 m).   Weight as of this encounter: 62.6 kg. Patient also counseled that weight may inhibit the healing process Patient counseled that losing weight will help with future health issues          Danae Orleans PA-C  [facility]  [place] [address], [place], [place] [address] Phone: [phone] [email or web address] Facebook  [employer]

## 2019-06-09 NOTE — Progress Notes (Signed)
Met with pt this morning and confirming that pt already has needed DME and plan for d/c home with HEP.  No further needs.  Tanza Pellot, LCSW

## 2019-06-09 NOTE — Progress Notes (Signed)
Physical Therapy Treatment Patient Details Name: Kaitlyn Serrano MRN: [id number] DOB: 19-Nov-1940 Today's Date: 06/09/2019    History of Present Illness T THR ant    PT Comments    POD # 1 am session Assisted OOB.  General bed mobility comments: demonstarted and instructed how to use a belt loop to self assis.  tGeneral transfer comment: 25% VC's on proper hand placement and safety with turns.  Also assisted to bathroom. LE.  General Gait Details: 25% VC's on proper sequencing and upright posture. Then returned to room to perform some TE's following HEP handout.  Instructed on proper tech, freq as well as use of ICE.   Addressed all mobility questions, discussed appropriate activity, educated on use of ICE.  Pt ready for D/C to home.   Follow Up Recommendations  Follow surgeon's recommendation for DC plan and follow-up therapies(HEP)     Equipment Recommendations  Rolling walker with 5" wheels    Recommendations for Other Services       Precautions / Restrictions Precautions Precautions: Fall Restrictions Weight Bearing Restrictions: No Other Position/Activity Restrictions: WBAT    Mobility  Bed Mobility Overal bed mobility: Needs Assistance Bed Mobility: Supine to Sit     Supine to sit: Supervision;Min guard     General bed mobility comments: demonstarted and instructed how to use a belt loop to self assist LE  Transfers Overall transfer level: Needs assistance Equipment used: Rolling walker (2 wheeled) Transfers: Sit to/from Stand Sit to Stand: Supervision;Min guard         General transfer comment: 25% VC's on proper hand placement and safety with turns.  Also assisted to bathroom.  Ambulation/Gait Ambulation/Gait assistance: Min guard;Supervision Gait Distance (Feet): 45 Feet Assistive device: Rolling walker (2 wheeled) Gait Pattern/deviations: Step-to pattern;Decreased step length - right;Decreased step length - left Gait velocity: WNL   General Gait  Details: 25% VC's on proper sequencing and upright posture.   Stairs             Wheelchair Mobility    Modified Rankin (Stroke Patients Only)       Balance                                            Cognition Arousal/Alertness: Awake/alert Behavior During Therapy: WFL for tasks assessed/performed Overall Cognitive Status: Within Functional Limits for tasks assessed                                 General Comments: pleasant      Exercises   Total Hip Replacement TE's following HEP Handout 10 reps ankle pumps 05 reps knee presses 05 reps heel slides 05 reps SAQ's 05 reps ABD Instructed how to use a belt loop to assist  Followed by ICE     General Comments        Pertinent Vitals/Pain Pain Assessment: 0-10 Pain Score: 3  Pain Location: R hip Pain Descriptors / Indicators: Grimacing;Guarding;Sore;Operative site guarding Pain Intervention(s): Monitored during session;Premedicated before session;Repositioned;Ice applied    Home Living                      Prior Function            PT Goals (current goals can now be found in the care plan section) Progress towards  PT goals: Progressing toward goals    Frequency    7X/week      PT Plan Current plan remains appropriate    Co-evaluation              AM-PAC PT "6 Clicks" Mobility   Outcome Measure  Help needed turning from your back to your side while in a flat bed without using bedrails?: A Little Help needed moving from lying on your back to sitting on the side of a flat bed without using bedrails?: A Little Help needed moving to and from a bed to a chair (including a wheelchair)?: A Little Help needed standing up from a chair using your arms (e.g., wheelchair or bedside chair)?: A Little Help needed to walk in hospital room?: A Little Help needed climbing 3-5 steps with a railing? : A Little 6 Click Score: 18    End of Session Equipment  Utilized During Treatment: Gait belt Activity Tolerance: Patient tolerated treatment well Patient left: in chair;with call bell/phone within reach;with chair alarm set Nurse Communication: Mobility status(pt ready for D/C to home) PT Visit Diagnosis: Difficulty in walking, not elsewhere classified (R26.2);Pain Pain - Right/Left: Right Pain - part of body: Hip     Time: [id number] PT Time Calculation (min) (ACUTE ONLY): 27 min  Charges:  $Gait Training: 8-22 mins $Therapeutic Exercise: 8-22 mins                     Rica Koyanagi  PTA Acute  Rehabilitation Services Pager      [phone] Office      [phone]

## 2019-06-14 NOTE — Discharge Summary (Signed)
Physician Discharge Summary  Patient ID: Kaitlyn Serrano MRN: [id number] DOB/AGE: 08-28-1940 79 y.o.  Admit date: 06/08/2019 Discharge date: 06/09/2019   Procedures:  Procedure(s) (LRB): TOTAL HIP ARTHROPLASTY ANTERIOR APPROACH (Right)  Attending Physician:  Dr. 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 family unaware   Dysphagia    Dyspnea    Generalized weakness    GERD (gastroesophageal reflux disease)    GERD (gastroesophageal reflux disease)    High cholesterol    Hoarseness    Hx of cardiovascular stress test    negative 2012 and 2016   Hypertension    Insomnia    Intervertebral disc disorder with myelopathy, lumbar region    Lumbago    Lumbar spine pain    PONV (postoperative nausea and vomiting)    confusion after anesthesia   Renal insufficiency    Ringing in ears    resolved   Spondylosis with myelopathy, lumbar region    legs, back    HPI:    Kaitlyn Serrano, 79 y.o. female, has a history of pain and functional disability in the right hip(s) due to trauma and arthritis and patient has failed non-surgical conservative treatments for greater than 12 weeks to include NSAID's and/or analgesics, corticosteriod injections, use of assistive devices and activity modification.  Onset of symptoms was abrupt starting 3+ years ago with gradually worsening course since that time.The patient noted no past surgery on the right hip(s).  Patient currently rates pain in the right hip at 10 out of 10 with activity. Patient has night pain, worsening of pain with activity and weight bearing, trendelenberg gait, pain that interfers  with activities of daily living and pain with passive range of motion. Patient has evidence of periarticular osteophytes and joint space narrowing by imaging studies. This condition presents safety issues increasing the risk of falls.  There is no current active infection.  Risks, benefits and expectations were discussed with the patient.  Risks including but not limited to the risk of anesthesia, blood clots, nerve damage, blood vessel damage, failure of the prosthesis, infection and up to and including death.  Patient understand the risks, benefits and expectations and wishes to proceed with surgery.   PCP: Janora Norlander, DO   Discharged Condition: good  Hospital Course:  Patient underwent the above stated procedure on 06/08/2019. Patient tolerated the procedure well and brought to the recovery room in good condition and subsequently to the floor.  POD #1 BP: 102/70 ; Pulse: 85 ; Temp: 97.7 F (36.5 C) ; Resp: 18 Patient reports pain as mild, pain controlled medication.  No reported events throughout the night.  We discussed the procedure, findings and expectations moving forward.  Is ready to be discharged home. Dorsiflexion/plantar flexion intact, incision: dressing C/D/I, no cellulitis present and compartment soft.   LABS  Basename    HGB     10.0  HCT     29.7    Discharge Exam: General appearance: alert, cooperative and no distress Extremities: Homans sign is negative, no sign of DVT, no edema, redness or tenderness in the calves or thighs and no ulcers, gangrene  or trophic changes  Disposition: Home with follow up in 2 weeks   Follow-up Information    Paralee Cancel, MD. Go on 06/23/2019.   Specialty: Orthopedic Surgery Why: You are scheduled for a post-operative appointment on 06-23-19 at 3:45 pm.  Contact information: [address] [place] [address] [id number]           Discharge Instructions    Call MD / Call 911   Complete by: As directed     If you experience chest pain or shortness of breath, CALL 911 and be transported to the hospital emergency room.  If you develope a fever above 101 F, pus (white drainage) or increased drainage or redness at the wound, or calf pain, call your surgeon's office.   Change dressing   Complete by: As directed    Maintain surgical dressing until follow up in the clinic. If the edges start to pull up, may reinforce with tape. If the dressing is no longer working, may remove and cover with gauze and tape, but must keep the area dry and clean.  Call with any questions or concerns.   Constipation Prevention   Complete by: As directed    Drink plenty of fluids.  Prune juice may be helpful.  You may use a stool softener, such as Colace (over the counter) 100 mg twice a day.  Use MiraLax (over the counter) for constipation as needed.   Diet - low sodium heart healthy   Complete by: As directed    Discharge instructions   Complete by: As directed    Maintain surgical dressing until follow up in the clinic. If the edges start to pull up, may reinforce with tape. If the dressing is no longer working, may remove and cover with gauze and tape, but must keep the area dry and clean.  Follow up in 2 weeks at [name]. Call with any questions or concerns.   Increase activity slowly as tolerated   Complete by: As directed    Weight bearing as tolerated with assist device (walker, cane, etc) as directed, use it as long as suggested by your surgeon or therapist, typically at least 4-6 weeks.   TED hose   Complete by: As directed    Use stockings (TED hose) for 2 weeks on both leg(s).  You may remove them at night for sleeping.      Allergies as of 06/09/2019      Reactions   Bee Venom Anaphylaxis   Penicillins Anaphylaxis, Rash   Has patient had a PCN reaction causing immediate rash, facial/tongue/throat swelling, SOB or lightheadedness with hypotension: Yes Has patient had a PCN reaction causing severe rash  involving mucus membranes or skin necrosis: Yes Has patient had a PCN reaction that required hospitalization: Yes Has patient had a PCN reaction occurring within the last 10 years: No If all of the above answers are "NO", then may proceed with Cephalosporin use.   Amlodipine Swelling   Elavil [amitriptyline Hcl] Other (See Comments)   Confusion, hallucinations   Statins Swelling, Other (See Comments)   Peeling of skin      Medication List    STOP taking these medications   HYDROcodone-acetaminophen 10-325 MG tablet Commonly known as: NORCO   polyethylene glycol powder 17 GM/SCOOP powder Commonly known as: GLYCOLAX/MIRALAX Replaced by: polyethylene glycol 17 g packet   traZODone 50 MG tablet Commonly known as: DESYREL     TAKE these medications   acetaminophen 500 MG tablet Commonly known  as: TYLENOL Take 2 tablets (1,000 mg total) by mouth every 8 (eight) hours.   atenolol 25 MG tablet Commonly known as: TENORMIN Take 1 tablet (25 mg total) by mouth daily.   busPIRone 10 MG tablet Commonly known as: BUSPAR Take 1 tablet (10 mg total) by mouth 3 (three) times daily.   dicyclomine 10 MG capsule Commonly known as: BENTYL TAKE  (1)  CAPSULE  TWICE DAILY (TAKE ON AN EMPTY STOMACH AT LEAST 30 MIN- UTES BEFORE MEALS).   diltiazem 120 MG 24 hr capsule Commonly known as: TIAZAC TAKE (1) CAPSULE DAILY What changed:   how much to take  how to take this  when to take this  additional instructions   docusate sodium 100 MG capsule Commonly known as: Colace Take 1 capsule (100 mg total) by mouth 2 (two) times daily.   DULoxetine 30 MG capsule Commonly known as: CYMBALTA TAKE (1) CAPSULE DAILY.  Needs to be seen for future refills. What changed:   how much to take  how to take this  when to take this   EPINEPHrine 0.3 mg/0.3 mL Soaj injection Commonly known as: EPI-PEN Inject 0.3 mLs (0.3 mg total) into the muscle once. What changed:   when to take  this  reasons to take this   Femring 0.05 MG/24HR Ring Generic drug: Estradiol Acetate INSERT (1) INTO THE VAGINA AS DIR- ECTED. -FOR VAGINAL USE- What changed:   how much to take  how to take this  when to take this   ferrous sulfate 325 (65 FE) MG tablet Commonly known as: FerrouSul Take 1 tablet (325 mg total) by mouth 3 (three) times daily with meals for 14 days.   fluticasone 50 MCG/ACT nasal spray Commonly known as: FLONASE Place 2 sprays into both nostrils daily. What changed:   when to take this  reasons to take this   lisinopril 20 MG tablet Commonly known as: ZESTRIL Take 1 tablet (20 mg total) by mouth daily.   loratadine 10 MG tablet Commonly known as: CLARITIN Take 1 tablet (10 mg total) by mouth daily.   oxyCODONE 5 MG immediate release tablet Commonly known as: Oxy IR/ROXICODONE Take 1-2 tablets (5-10 mg total) by mouth every 4 (four) hours as needed for moderate pain or severe pain.   polyethylene glycol 17 g packet Commonly known as: MIRALAX / GLYCOLAX Take 17 g by mouth 2 (two) times daily. Replaces: polyethylene glycol powder 17 GM/SCOOP powder   rivaroxaban 10 MG Tabs tablet Commonly known as: Xarelto Take 1 tablet (10 mg total) by mouth daily.   tiZANidine 2 MG tablet Commonly known as: ZANAFLEX Take 1 mg by mouth in the morning, at noon, and at bedtime.            Discharge Care Instructions  (From admission, onward)         Start     Ordered   06/09/19 0000  Change dressing    Comments: Maintain surgical dressing until follow up in the clinic. If the edges start to pull up, may reinforce with tape. If the dressing is no longer working, may remove and cover with gauze and tape, but must keep the area dry and clean.  Call with any questions or concerns.   06/09/19 [id number]           Signed: [place] Pugh. Janna Oak   PA-C  06/14/2019, 8:32 AM

## 2019-06-15 ENCOUNTER — Ambulatory Visit (INDEPENDENT_AMBULATORY_CARE_PROVIDER_SITE_OTHER): Payer: Medicare HMO | Admitting: Family Medicine

## 2019-06-15 DIAGNOSIS — F411 Generalized anxiety disorder: Secondary | ICD-10-CM

## 2019-06-15 DIAGNOSIS — F331 Major depressive disorder, recurrent, moderate: Secondary | ICD-10-CM

## 2019-06-15 NOTE — Progress Notes (Signed)
Initially spoke to patient's daughter Kaitlyn Serrano, who noted that BuSpar has been helping with her anxiety quite a bit.  She has had some postop memory issues that were present preop but exacerbated after her surgery on the 13th.  She thinks that maybe this is due to some pain issues but would like to have her formally evaluated by neurology for dementia.  She gave me her sister Kaitlyn Serrano's number to contact the patient for her virtual visit but unfortunately Kaitlyn Serrano is at her own doctor's office visit and is unable to accommodate patient's schedule televisit today.  She will call the office to reschedule at a later date and time.  We will plan to place referral once I actually get to talk to the patient and assess her formally.  Kaitlyn Serrano M. Lajuana Ripple, [facility]

## 2019-06-16 ENCOUNTER — Telehealth: Payer: Self-pay | Admitting: Family Medicine

## 2019-06-16 NOTE — Telephone Encounter (Signed)
Aware.She should return to surgeon for dressing change. ( She had walked in to our office with her daughter and wanted it done.  Left angry when we could not accommodate).

## 2019-06-17 DIAGNOSIS — M545 Low back pain: Secondary | ICD-10-CM | POA: Diagnosis not present

## 2019-06-17 DIAGNOSIS — G894 Chronic pain syndrome: Secondary | ICD-10-CM | POA: Diagnosis not present

## 2019-06-21 ENCOUNTER — Other Ambulatory Visit: Payer: Self-pay | Admitting: Family Medicine

## 2019-06-21 DIAGNOSIS — F411 Generalized anxiety disorder: Secondary | ICD-10-CM

## 2019-06-30 ENCOUNTER — Other Ambulatory Visit: Payer: Self-pay | Admitting: Family

## 2019-07-02 ENCOUNTER — Other Ambulatory Visit: Payer: Self-pay | Admitting: Family

## 2019-07-05 ENCOUNTER — Telehealth: Payer: Self-pay | Admitting: *Deleted

## 2019-07-05 NOTE — Telephone Encounter (Signed)
We are having to obtain a PA for femring 0.05/day. I have been unable to find a diagnosis. She has been on since 2015. Pls help. You are covering for Dr. Darnell Level

## 2019-07-06 DIAGNOSIS — G894 Chronic pain syndrome: Secondary | ICD-10-CM | POA: Diagnosis not present

## 2019-07-06 DIAGNOSIS — M545 Low back pain: Secondary | ICD-10-CM | POA: Diagnosis not present

## 2019-07-06 DIAGNOSIS — I10 Essential (primary) hypertension: Secondary | ICD-10-CM | POA: Diagnosis not present

## 2019-07-06 DIAGNOSIS — M4325 Fusion of spine, thoracolumbar region: Secondary | ICD-10-CM | POA: Diagnosis not present

## 2019-07-06 DIAGNOSIS — Z6822 Body mass index (BMI) 22.0-22.9, adult: Secondary | ICD-10-CM | POA: Diagnosis not present

## 2019-07-06 NOTE — Telephone Encounter (Signed)
I would say that I would put the diagnosis down as hormone replacement therapy or postmenopausal, or hot flashes

## 2019-07-07 NOTE — Telephone Encounter (Signed)
Femring  PA in process. Key: [facility]  Your information has been sent to [facility].

## 2019-07-08 NOTE — Telephone Encounter (Signed)
Prior Auth for Femring 0.05mg /day Vag Ring-APPROVED till 12/321/21.  Pharmacy notified.

## 2019-07-21 ENCOUNTER — Other Ambulatory Visit: Payer: Self-pay | Admitting: Family

## 2019-07-21 DIAGNOSIS — F411 Generalized anxiety disorder: Secondary | ICD-10-CM

## 2019-07-29 ENCOUNTER — Other Ambulatory Visit: Payer: Self-pay | Admitting: Family

## 2019-08-09 ENCOUNTER — Ambulatory Visit: Payer: Medicare HMO | Admitting: Family Medicine

## 2019-08-09 NOTE — Progress Notes (Deleted)
Subjective: CC: HTN, HLD, h/o MI PCP: Kaitlyn Norlander, DO KXF:GHWEXHB T Kaitlyn Serrano is a 79 y.o. female presenting to clinic today for:  1. HTN, HLD, h/o MI ***   ROS: Per HPI  Allergies  Allergen Reactions  . Bee Venom Anaphylaxis  . Penicillins Anaphylaxis and Rash    Has patient had a PCN reaction causing immediate rash, facial/tongue/throat swelling, SOB or lightheadedness with hypotension: Yes Has patient had a PCN reaction causing severe rash involving mucus membranes or skin necrosis: Yes Has patient had a PCN reaction that required hospitalization: Yes Has patient had a PCN reaction occurring within the last 10 years: No If all of the above answers are "NO", then may proceed with Cephalosporin use.   . Amlodipine Swelling  . Elavil [Amitriptyline Hcl] Other (See Comments)    Confusion, hallucinations  . Statins Swelling and Other (See Comments)    Peeling of skin   Past Medical History:  Diagnosis Date  . 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 Chronic pain syndrome   . Complication of anesthesia   . Diverticulosis   . 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 family unaware  . Dysphagia   . Dyspnea   . Generalized weakness   . GERD (gastroesophageal reflux disease)   . GERD (gastroesophageal reflux disease)   . High cholesterol   . Hoarseness   . Hx of cardiovascular stress test    negative 2012 and 2016  . Hypertension   . Insomnia   . Intervertebral disc disorder with myelopathy, lumbar region   . Lumbago   . Lumbar spine pain   . PONV (postoperative nausea and vomiting)    confusion after anesthesia  . Renal insufficiency   . Ringing in ears    resolved  . Spondylosis with myelopathy, lumbar region    legs, back    Current Outpatient Medications:  .  acetaminophen (TYLENOL) 500 MG tablet, Take 2 tablets (1,000 mg total) by mouth every 8 (eight) hours., Disp: 30 tablet, Rfl: 0 .  atenolol (TENORMIN) 25 MG tablet, Take 1 tablet (25 mg total) by  mouth daily., Disp: 90 tablet, Rfl: 1 .  busPIRone (BUSPAR) 10 MG tablet, TAKE  (1)  TABLET  THREE TIMES DAILY., Disp: 90 tablet, Rfl: 2 .  dicyclomine (BENTYL) 10 MG capsule, TAKE  (1)  CAPSULE  TWICE DAILY (TAKE ON AN EMPTY STOMACH AT LEAST 30 MIN- UTES BEFORE MEALS)., Disp: 60 capsule, Rfl: 0 .  diltiazem (TIAZAC) 120 MG 24 hr capsule, TAKE (1) CAPSULE DAILY (Patient taking differently: Take 120 mg by mouth daily. ), Disp: 90 capsule, Rfl: 0 .  docusate sodium (COLACE) 100 MG capsule, Take 1 capsule (100 mg total) by mouth 2 (two) times daily., Disp: 28 capsule, Rfl: 0 .  DULoxetine (CYMBALTA) 30 MG capsule, TAKE (1) CAPSULE DAILY, Disp: 90 capsule, Rfl: 0 .  EPINEPHrine 0.3 mg/0.3 mL IJ SOAJ injection, Inject 0.3 mLs (0.3 mg total) into the muscle once. (Patient taking differently: Inject 0.3 mg into the muscle as needed for anaphylaxis. ), Disp: 2 Device, Rfl: 1 .  FEMRING 0.05 MG/24HR RING, INSERT (1) INTO THE VAGINA AS DIR- ECTED. -FOR VAGINAL USE-, Disp: 1 each, Rfl: 0 .  ferrous sulfate (FERROUSUL) 325 (65 FE) MG tablet, Take 1 tablet (325 mg total) by mouth 3 (three) times daily with meals for 14 days., Disp: 42 tablet, Rfl: 0 .  fluticasone (FLONASE) 50 MCG/ACT nasal spray, Place 2 sprays into both nostrils daily. (Patient taking differently:  Place 2 sprays into both nostrils daily as needed for allergies. ), Disp: 16 g, Rfl: 6 .  lisinopril (ZESTRIL) 20 MG tablet, Take 1 tablet (20 mg total) by mouth daily., Disp: 90 tablet, Rfl: 3 .  loratadine (CLARITIN) 10 MG tablet, Take 1 tablet (10 mg total) by mouth daily., Disp: 30 tablet, Rfl: 11 .  oxyCODONE (OXY IR/ROXICODONE) 5 MG immediate release tablet, Take 1-2 tablets (5-10 mg total) by mouth every 4 (four) hours as needed for moderate pain or severe pain., Disp: 60 tablet, Rfl: 0 .  polyethylene glycol (MIRALAX / GLYCOLAX) 17 g packet, Take 17 g by mouth 2 (two) times daily., Disp: 28 packet, Rfl: 0 .  rivaroxaban (XARELTO) 10 MG TABS  tablet, Take 1 tablet (10 mg total) by mouth daily., Disp: 14 tablet, Rfl: 0 .  tiZANidine (ZANAFLEX) 2 MG tablet, Take 1 mg by mouth in the morning, at noon, and at bedtime., Disp: , Rfl:  Social History   Socioeconomic History  . Marital status: Divorced    Spouse name: Not on file  . Number of children: 2  . Years of education: Not on file  . Highest education level: Not on file  Occupational History  . Not on file  Tobacco Use  . Smoking status: Former Smoker    Packs/day: 4.00    Years: 3.00    Pack years: 12.00    Types: Cigarettes    Start date: 06/10/1960    Quit date: 02/20/1964    Years since quitting: 55.5  . Smokeless tobacco: Never Used  Vaping Use  . Vaping Use: Never used  Substance and Sexual Activity  . Alcohol use: No    Alcohol/week: 0.0 standard drinks  . Drug use: No  . Sexual activity: Not Currently    Birth control/protection: None  Other Topics Concern  . Not on file  Social History Narrative   ** Merged History Encounter **      [name] lives alone. She is separated/divorced from her second husband and widowed by her first husband. She has twin daughters, Kaitlyn Serrano and Kaitlyn Serrano. Kaitlyn Serrano has been a cause of stress for her and she believes was preventing her from getting medical care that she needed. She is no longer talking to [place]. She states that Kaitlyn Serrano is involved in her care and that she trusts her to have her best interest in mind.        Social Determinants of Health   Financial Resource Strain: Low Risk   . Difficulty of Paying Living Expenses: Not hard at all  Food Insecurity: No Food Insecurity  . Worried About Charity fundraiser in the Last Year: Never true  . Ran Out of Food in the Last Year: Never true  Transportation Needs: No Transportation Needs  . Lack of Transportation (Medical): No  . Lack of Transportation (Non-Medical): No  Physical Activity: Inactive  . Days of Exercise per Week: 0 days  . Minutes of Exercise per Session:  0 min  Stress: Stress Concern Present  . Feeling of Stress : To some extent  Social Connections: Socially Isolated  . Frequency of Communication with Friends and Family: More than three times a week  . Frequency of Social Gatherings with Friends and Family: Three times a week  . Attends Religious Services: Never  . Active Member of Clubs or Organizations: No  . Attends Archivist Meetings: Never  . Marital Status: Divorced  Human resources officer Violence: Not At Risk  .  Fear of Current or Ex-Partner: No  . Emotionally Abused: No  . Physically Abused: No  . Sexually Abused: No   Family History  Problem Relation Age of Onset  . Diabetes Mother   . Heart disease Mother   . Hypertension Mother   . Gallbladder disease Mother   . Heart disease Father   . Pneumonia Sister   . Hypertension Brother   . Hyperlipidemia Brother   . Gallbladder disease Other   . Hypertension Child     Objective: Office vital signs reviewed. There were no vitals taken for this visit.  Physical Examination:  General: Awake, alert, *** nourished, No acute distress HEENT: Normal    Neck: No masses palpated. No lymphadenopathy    Ears: Tympanic membranes intact, normal light reflex, no erythema, no bulging    Eyes: PERRLA, extraocular membranes intact, sclera ***    Nose: nasal turbinates moist, *** nasal discharge    Throat: moist mucus membranes, no erythema, *** tonsillar exudate.  Airway is patent Cardio: regular rate and rhythm, S1S2 heard, no murmurs appreciated Pulm: clear to auscultation bilaterally, no wheezes, rhonchi or rales; normal work of breathing on room air GI: soft, non-tender, non-distended, bowel sounds present x4, no hepatomegaly, no splenomegaly, no masses GU: external vaginal tissue ***, cervix ***, *** punctate lesions on cervix appreciated, *** discharge from cervical os, *** bleeding, *** cervical motion tenderness, *** abdominal/ adnexal masses Extremities: warm, well  perfused, No edema, cyanosis or clubbing; +*** pulses bilaterally MSK: *** gait and *** station Skin: dry; intact; no rashes or lesions Neuro: *** Strength and light touch sensation grossly intact, *** DTRs ***/4  Assessment/ Plan: 79 y.o. female   ***  No orders of the defined types were placed in this encounter.  No orders of the defined types were placed in this encounter.    Kaitlyn Norlander, DO [place] [phone]

## 2019-08-11 ENCOUNTER — Encounter: Payer: Self-pay | Admitting: Family Medicine

## 2019-08-16 ENCOUNTER — Other Ambulatory Visit: Payer: Self-pay | Admitting: Family

## 2019-08-19 ENCOUNTER — Encounter: Payer: Self-pay | Admitting: Family Medicine

## 2019-08-19 ENCOUNTER — Other Ambulatory Visit: Payer: Self-pay

## 2019-08-19 ENCOUNTER — Ambulatory Visit (INDEPENDENT_AMBULATORY_CARE_PROVIDER_SITE_OTHER): Payer: Medicare HMO | Admitting: Family Medicine

## 2019-08-19 VITALS — BP 140/82 | HR 60 | Temp 97.6°F | Ht 62.0 in | Wt 134.6 lb

## 2019-08-19 DIAGNOSIS — F411 Generalized anxiety disorder: Secondary | ICD-10-CM | POA: Diagnosis not present

## 2019-08-19 DIAGNOSIS — R238 Other skin changes: Secondary | ICD-10-CM

## 2019-08-19 DIAGNOSIS — I10 Essential (primary) hypertension: Secondary | ICD-10-CM | POA: Diagnosis not present

## 2019-08-19 DIAGNOSIS — R4189 Other symptoms and signs involving cognitive functions and awareness: Secondary | ICD-10-CM

## 2019-08-19 DIAGNOSIS — R413 Other amnesia: Secondary | ICD-10-CM | POA: Diagnosis not present

## 2019-08-19 DIAGNOSIS — I252 Old myocardial infarction: Secondary | ICD-10-CM | POA: Diagnosis not present

## 2019-08-19 DIAGNOSIS — R69 Illness, unspecified: Secondary | ICD-10-CM | POA: Diagnosis not present

## 2019-08-19 DIAGNOSIS — R233 Spontaneous ecchymoses: Secondary | ICD-10-CM

## 2019-08-19 MED ORDER — TRAZODONE HCL 50 MG PO TABS: 25.0000 mg | ORAL_TABLET | Freq: Every evening | ORAL | 3 refills | Status: DC | PRN

## 2019-08-19 NOTE — Progress Notes (Signed)
Subjective: CC: HTN, HLD, h/o MI PCP: Janora Norlander, DO INO:MVEHMCN T Kaitlyn Serrano is a 79 y.o. female presenting to clinic today for:  1. HTN, HLD, h/o MI Patient reports to me that she is overall doing really well.  No chest pain, shortness of breath, dizziness, falls.  She notes that she has been compliant with her medications.  2.  Anxiety/insomnia Patient reports good control of anxiety with buspirone, Cymbalta.  She was for some reason taken off trazodone at discharge from hospital and she really wants to have this back as she just used her last pill.  She reports that she otherwise does not sleep well.  There was some concern by her daughter that there may be an underlying dementia and we were supposed to have an appointment to previously evaluate this but this was unfortunately canceled due to a scheduling conflict on the family's behalf.  Patient has not think she has a memory issue.  However, [name] did note some changes after her surgery.  Initially thought to be secondary to pain but patient overall feels well today.   ROS: Per HPI  Allergies  Allergen Reactions  . Bee Venom Anaphylaxis  . Penicillins Anaphylaxis and Rash    Has patient had a PCN reaction causing immediate rash, facial/tongue/throat swelling, SOB or lightheadedness with hypotension: Yes Has patient had a PCN reaction causing severe rash involving mucus membranes or skin necrosis: Yes Has patient had a PCN reaction that required hospitalization: Yes Has patient had a PCN reaction occurring within the last 10 years: No If all of the above answers are "NO", then may proceed with Cephalosporin use.   . Amlodipine Swelling  . Elavil [Amitriptyline Hcl] Other (See Comments)    Confusion, hallucinations  . Statins Swelling and Other (See Comments)    Peeling of skin   Past Medical History:  Diagnosis Date  . 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 Chronic pain syndrome   . Complication of anesthesia    . Diverticulosis   . 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 family unaware  . Dysphagia   . Dyspnea   . Generalized weakness   . GERD (gastroesophageal reflux disease)   . GERD (gastroesophageal reflux disease)   . High cholesterol   . Hoarseness   . Hx of cardiovascular stress test    negative 2012 and 2016  . Hypertension   . Insomnia   . Intervertebral disc disorder with myelopathy, lumbar region   . Lumbago   . Lumbar spine pain   . PONV (postoperative nausea and vomiting)    confusion after anesthesia  . Renal insufficiency   . Ringing in ears    resolved  . Spondylosis with myelopathy, lumbar region    legs, back    Current Outpatient Medications:  .  acetaminophen (TYLENOL) 500 MG tablet, Take 2 tablets (1,000 mg total) by mouth every 8 (eight) hours., Disp: 30 tablet, Rfl: 0 .  busPIRone (BUSPAR) 10 MG tablet, TAKE  (1)  TABLET  THREE TIMES DAILY., Disp: 90 tablet, Rfl: 2 .  dicyclomine (BENTYL) 10 MG capsule, TAKE  (1)  CAPSULE  TWICE DAILY (TAKE ON AN EMPTY STOMACH AT LEAST 30 MIN- UTES BEFORE MEALS)., Disp: 60 capsule, Rfl: 0 .  diltiazem (TIAZAC) 120 MG 24 hr capsule, TAKE (1) CAPSULE DAILY (Patient taking differently: Take 120 mg by mouth daily. ), Disp: 90 capsule, Rfl: 0 .  DULoxetine (CYMBALTA) 30 MG capsule, TAKE (1) CAPSULE DAILY, Disp: 90 capsule, Rfl: 0 .  EPINEPHrine 0.3 mg/0.3 mL IJ SOAJ injection, Inject 0.3 mLs (0.3 mg total) into the muscle once. (Patient taking differently: Inject 0.3 mg into the muscle as needed for anaphylaxis. ), Disp: 2 Device, Rfl: 1 .  FEMRING 0.05 MG/24HR RING, INSERT (1) INTO THE VAGINA AS DIR- ECTED. -FOR VAGINAL USE-, Disp: 1 each, Rfl: 3 .  fluticasone (FLONASE) 50 MCG/ACT nasal spray, Place 2 sprays into both nostrils daily. (Patient taking differently: Place 2 sprays into both nostrils daily as needed for allergies. ), Disp: 16 g, Rfl: 6 .  lisinopril (ZESTRIL) 20 MG tablet, Take 1 tablet (20 mg total) by mouth daily., Disp: 90  tablet, Rfl: 3 .  oxyCODONE (OXY IR/ROXICODONE) 5 MG immediate release tablet, Take 1-2 tablets (5-10 mg total) by mouth every 4 (four) hours as needed for moderate pain or severe pain., Disp: 60 tablet, Rfl: 0 .  polyethylene glycol (MIRALAX / GLYCOLAX) 17 g packet, Take 17 g by mouth 2 (two) times daily., Disp: 28 packet, Rfl: 0 .  tiZANidine (ZANAFLEX) 2 MG tablet, Take 1 mg by mouth in the morning, at noon, and at bedtime., Disp: , Rfl:  .  atenolol (TENORMIN) 25 MG tablet, Take 1 tablet (25 mg total) by mouth daily., Disp: 90 tablet, Rfl: 1 .  docusate sodium (COLACE) 100 MG capsule, Take 1 capsule (100 mg total) by mouth 2 (two) times daily. (Patient not taking: Reported on 08/19/2019), Disp: 28 capsule, Rfl: 0 .  ferrous sulfate (FERROUSUL) 325 (65 FE) MG tablet, Take 1 tablet (325 mg total) by mouth 3 (three) times daily with meals for 14 days., Disp: 42 tablet, Rfl: 0 .  loratadine (CLARITIN) 10 MG tablet, Take 1 tablet (10 mg total) by mouth daily. (Patient not taking: Reported on 08/19/2019), Disp: 30 tablet, Rfl: 11 Social History   Socioeconomic History  . Marital status: Divorced    Spouse name: Not on file  . Number of children: 2  . Years of education: Not on file  . Highest education level: Not on file  Occupational History  . Not on file  Tobacco Use  . Smoking status: Former Smoker    Packs/day: 4.00    Years: 3.00    Pack years: 12.00    Types: Cigarettes    Start date: 06/10/1960    Quit date: 02/20/1964    Years since quitting: 55.5  . Smokeless tobacco: Never Used  Vaping Use  . Vaping Use: Never used  Substance and Sexual Activity  . Alcohol use: No    Alcohol/week: 0.0 standard drinks  . Drug use: No  . Sexual activity: Not Currently    Birth control/protection: None  Other Topics Concern  . Not on file  Social History Narrative   ** Merged History Encounter **      [name] lives alone. She is separated/divorced from her second husband and widowed by  her first husband. She has twin daughters, [name] and [name]. [name] has been a cause of stress for her and she believes was preventing her from getting medical care that she needed. She is no longer talking to [place]. She states that [name] is involved in her care and that she trusts her to have her best interest in mind.        Social Determinants of Health   Financial Resource Strain: Low Risk   . Difficulty of Paying Living Expenses: Not hard at all  Food Insecurity: No Food Insecurity  . Worried About Charity fundraiser  in the Last Year: Never true  . Ran Out of Food in the Last Year: Never true  Transportation Needs: No Transportation Needs  . Lack of Transportation (Medical): No  . Lack of Transportation (Non-Medical): No  Physical Activity: Inactive  . Days of Exercise per Week: 0 days  . Minutes of Exercise per Session: 0 min  Stress: Stress Concern Present  . Feeling of Stress : To some extent  Social Connections: Socially Isolated  . Frequency of Communication with Friends and Family: More than three times a week  . Frequency of Social Gatherings with Friends and Family: Three times a week  . Attends Religious Services: Never  . Active Member of Clubs or Organizations: No  . Attends Archivist Meetings: Never  . Marital Status: Divorced  Human resources officer Violence: Not At Risk  . Fear of Current or Ex-Partner: No  . Emotionally Abused: No  . Physically Abused: No  . Sexually Abused: No   Family History  Problem Relation Age of Onset  . Diabetes Mother   . Heart disease Mother   . Hypertension Mother   . Gallbladder disease Mother   . Heart disease Father   . Pneumonia Sister   . Hypertension Brother   . Hyperlipidemia Brother   . Gallbladder disease Other   . Hypertension Child     Objective: Office vital signs reviewed. BP (!) 178/75   Pulse 60   Temp 97.6 F (36.4 C)   Ht '5\' 2"'  (1.575 m)   Wt 134 lb 9.6 oz (61.1 kg)   SpO2 96%   BMI  24.62 kg/m   Physical Examination:  General: Awake, alert, nontoxic, No acute distress HEENT: Normal, sclera white, MMM Cardio: regular rate and rhythm, S1S2 heard, no murmurs appreciated Pulm: clear to auscultation bilaterally, no wheezes, rhonchi or rales; normal work of breathing on room air Extremities: warm, well perfused, No edema, cyanosis or clubbing; +2 pulses bilaterally Psych: Mood stable.  Patient is pleasant and interactive.  Does not appear to be responding to internal stimuli. Neuro: She is somewhat repetitive.  See MMSE below MMSE - Mini Mental State Exam 08/20/2019  Orientation to time 3  Orientation to Place 5  Registration 0  Attention/ Calculation 0  Recall 2  Language- name 2 objects 2  Language- repeat 0  Language- follow 3 step command 3  Language- read & follow direction 1  Write a sentence 0  Copy design 0  Total score 16   Results for orders placed or performed in visit on 08/19/19 (from the past 24 hour(s))  CBC     Status: None   Collection Time: 08/19/19  3:50 PM  Result Value Ref Range   WBC 7.5 3.4 - 10.8 x10E3/uL   RBC 4.97 3.77 - 5.28 x10E6/uL   Hemoglobin 14.4 11.1 - 15.9 g/dL   Hematocrit 44.1 34.0 - 46.6 %   MCV 89 79 - 97 fL   MCH 29.0 26.6 - 33.0 pg   MCHC 32.7 31 - 35 g/dL   RDW 11.8 11.7 - 15.4 %   Platelets 245 150 - 450 x10E3/uL   Narrative   Performed at:  [address], [place], [place]  [id number] Lab Director: Rush Farmer MD, Phone:  [phone]  [id number]     Status: None   Collection Time: 08/19/19  3:50 PM  Result Value Ref Range   Glucose 82 65 - 99 mg/dL   BUN 10 8 - 27 mg/dL  Creatinine, Ser 0.64 0.57 - 1.00 mg/dL   GFR calc non Af Amer 85 >59 mL/min/1.73   GFR calc Af Amer 98 >59 mL/min/1.73   BUN/Creatinine Ratio 16 12 - 28   Sodium 139 134 - 144 mmol/L   Potassium 4.7 3.5 - 5.2 mmol/L   Chloride 102 96 - 106 mmol/L   CO2 25 20 - 29 mmol/L   Calcium 9.4 8.7 - 10.3 mg/dL   Total  Protein 7.0 6.0 - 8.5 g/dL   Albumin 4.3 3.7 - 4.7 g/dL   Globulin, Total 2.7 1.5 - 4.5 g/dL   Albumin/Globulin Ratio 1.6 1.2 - 2.2   Bilirubin Total 0.5 0.0 - 1.2 mg/dL   Alkaline Phosphatase 115 48 - 121 IU/L   AST 21 0 - 40 IU/L   ALT 9 0 - 32 IU/L   Narrative   Performed at:  [address] [facility] [address] [place], [place]  [id number] Lab Director: Rush Farmer MD, Phone:  [phone]  Vitamin B12     Status: None   Collection Time: 08/19/19  3:50 PM  Result Value Ref Range   Vitamin B-12 478 232 - 1,245 pg/mL   Narrative   Performed at:  [address], [place], [place]  [id number] Lab Director: Rush Farmer MD, Phone:  [phone]  TSH     Status: None (Preliminary result)   Collection Time: 08/19/19  3:50 PM  Result Value Ref Range   TSH WILL FOLLOW    Narrative   Performed at:  01 - [facility] [address], [place], [place]  [id number] Lab Director: Rush Farmer MD, Phone:  [phone]  RPR     Status: None   Collection Time: 08/19/19  3:50 PM  Result Value Ref Range   RPR Ser Ql Non Reactive Non Reactive   Narrative   Performed at:  01 - [facility] [address], [place], [place]  [id number] Lab Director: Rush Farmer MD, Phone:  [phone]     Assessment/ Plan: 79 y.o. female   1. GAD (generalized anxiety disorder) Controlled.  Continue current regimen  2. Cognitive impairment MMSE score noted to be 16 today.  She does not appear to be in pain.  I will check for metabolic etiology.  I reached out to her daughter Helene Kelp, who previously voiced concern about her mother's memory.  Unfortunately, there was no answer and I left a voicemail.  Will attempt to contact her again on Monday and inform of referral to neurology.  Uncertain if Aricept or Namenda would be of benefit at this point. - Vitamin B12 - TSH - RPR - Ambulatory referral to Neurology  3. Easy bruising Check platelet level - CBC -  [id number]+EGFR  4. Essential hypertension Controlled  5. History of MI (myocardial infarction) Continue current regimen   No orders of the defined types were placed in this encounter.  No orders of the defined types were placed in this encounter.    Janora Norlander, DO [place] [phone]

## 2019-08-19 NOTE — Patient Instructions (Signed)
Trazodone sent to pharmacy.  Caution Serotonin Syndrome with Cymbalta.  Blood pressure high today.  Please make sure you are monitoring your blood pressure at home.  Goal <150/90.   Serotonin Syndrome Serotonin is a chemical in your body (neurotransmitter) that helps to control several functions, such as:  Brain and nerve cell function.  Mood and emotions.  Memory.  Eating.  Sleeping.  Sexual activity.  Stress response. Having too much serotonin in your body can cause serotonin syndrome. This condition can be harmful to your brain and nerve cells. This can be a life-threatening condition. What are the causes? This condition may be caused by taking medicines or drugs that increase the level of serotonin in your body, such as:  Antidepressant medicines.  Migraine medicines.  Certain pain medicines.  Certain drugs, including ecstasy, LSD, cocaine, and amphetamines.  Over-the-counter cough or cold medicines that contain dextromethorphan.  Certain herbal supplements, including St. John's wort, ginseng, and nutmeg. This condition usually occurs when you take these medicines or drugs in combination, but it can also happen with a high dose of a single medicine or drug. What increases the risk? You are more likely to develop this condition if:  You just started taking a medicine or drug that increases the level of serotonin in the body.  You recently increased the dose of a medicine or drug that increases the level of serotonin in the body.  You take more than one medicine or drug that increases the level of serotonin in the body. What are the signs or symptoms? Symptoms of this condition usually start within several hours of taking a medicine or drug. Symptoms may be mild or severe. Mild symptoms include:  Sweating.  Restlessness or agitation.  Muscle twitching or stiffness.  Rapid heart rate.  Nausea and vomiting.  Diarrhea.  Headache.  Shivering or goose  bumps.  Confusion. Severe symptoms include:  Irregular heartbeat.  Seizures.  Loss of consciousness.  High fever. How is this diagnosed? This condition may be diagnosed based on:  Your medical history.  A physical exam.  Your prior use of drugs and medicines.  Blood or urine tests. These may be used to rule out other causes of your symptoms. How is this treated? The treatment for this condition depends on the severity of your symptoms.  For mild cases, stopping the medicine or drug that caused your condition is usually all that is needed.  For moderate to severe cases, treatment in a hospital may be needed to prevent or manage life-threatening symptoms. This may include medicines to control your symptoms, IV fluids, interventions to support your breathing, and treatments to control your body temperature. Follow these instructions at home: Medicines   Take over-the-counter and prescription medicines only as told by your health care provider. This is important.  Check with your health care provider before you start taking any new prescriptions, over-the-counter medicines, herbs, or supplements.  Avoid combining any medicines that can cause this condition to occur. Lifestyle   Maintain a healthy lifestyle. ? Eat a healthy diet that includes plenty of vegetables, fruits, whole grains, low-fat dairy products, and lean protein. Do not eat a lot of foods that are high in fat, added sugars, or salt. ? Get the right amount and quality of sleep. Most adults need 7-9 hours of sleep each night. ? Make time to exercise, even if it is only for short periods of time. Most adults should exercise for at least 150 minutes each week. ? Do  not drink alcohol. ? Do not use illegal drugs, and do not take medicines for reasons other than they are prescribed. General instructions  Do not use any products that contain nicotine or tobacco, such as cigarettes and e-cigarettes. If you need help  quitting, ask your health care provider.  Keep all follow-up visits as told by your health care provider. This is important. Contact a health care provider if:  Your symptoms do not improve or they get worse. Get help right away if you:  Have worsening confusion, severe headache, chest pain, high fever, seizures, or loss of consciousness.  Experience serious side effects of medicine, such as swelling of your face, lips, tongue, or throat.  Have serious thoughts about hurting yourself or others. These symptoms may represent a serious problem that is an emergency. Do not wait to see if the symptoms will go away. Get medical help right away. Call your local emergency services (911 in the [place] Do not drive yourself to the hospital. If you ever feel like you may hurt yourself or others, or have thoughts about taking your own life, get help right away. You can go to your nearest emergency department or call:  Your local emergency services (911 in the [place]  A suicide crisis helpline, such as the [facility] at 843-303-1392. This is open 24 hours a day. Summary  Serotonin is a brain chemical that helps to regulate the nervous system. High levels of serotonin in the body can cause serotonin syndrome, which is a very dangerous condition.  This condition may be caused by taking medicines or drugs that increase the level of serotonin in your body.  Treatment depends on the severity of your symptoms. For mild cases, stopping the medicine or drug that caused your condition is usually all that is needed.  Check with your health care provider before you start taking any new prescriptions, over-the-counter medicines, herbs, or supplements. This information is not intended to replace advice given to you by your health care provider. Make sure you discuss any questions you have with your health care provider. Document Revised: 03/21/2017 Document Reviewed:  03/21/2017 Elsevier Patient Education  2020 [employer].

## 2019-08-20 LAB — CBC
Hematocrit: 44.1 % (ref 34.0–46.6)
Hemoglobin: 14.4 g/dL (ref 11.1–15.9)
MCH: 29 pg (ref 26.6–33.0)
MCHC: 32.7 g/dL (ref 31.5–35.7)
MCV: 89 fL (ref 79–97)
Platelets: 245 10*3/uL (ref 150–450)
RBC: 4.97 x10E6/uL (ref 3.77–5.28)
RDW: 11.8 % (ref 11.7–15.4)
WBC: 7.5 10*3/uL (ref 3.4–10.8)

## 2019-08-20 LAB — CMP14+EGFR
ALT: 9 IU/L (ref 0–32)
AST: 21 IU/L (ref 0–40)
Albumin/Globulin Ratio: 1.6 (ref 1.2–2.2)
Albumin: 4.3 g/dL (ref 3.7–4.7)
Alkaline Phosphatase: 115 IU/L (ref 48–121)
BUN/Creatinine Ratio: 16 (ref 12–28)
BUN: 10 mg/dL (ref 8–27)
Bilirubin Total: 0.5 mg/dL (ref 0.0–1.2)
CO2: 25 mmol/L (ref 20–29)
Calcium: 9.4 mg/dL (ref 8.7–10.3)
Chloride: 102 mmol/L (ref 96–106)
Creatinine, Ser: 0.64 mg/dL (ref 0.57–1.00)
GFR calc Af Amer: 98 mL/min/{1.73_m2} (ref >59)
GFR calc non Af Amer: 85 mL/min/{1.73_m2} (ref >59)
Globulin, Total: 2.7 g/dL (ref 1.5–4.5)
Glucose: 82 mg/dL (ref 65–99)
Potassium: 4.7 mmol/L (ref 3.5–5.2)
Sodium: 139 mmol/L (ref 134–144)
Total Protein: 7 g/dL (ref 6.0–8.5)

## 2019-08-20 LAB — RPR: RPR Ser Ql: NONREACTIVE

## 2019-08-20 LAB — TSH: TSH: 0.783 u[IU]/mL (ref 0.450–4.500)

## 2019-08-20 LAB — VITAMIN B12: Vitamin B-12: 478 pg/mL (ref 232–1245)

## 2019-08-24 ENCOUNTER — Telehealth: Payer: Self-pay | Admitting: Family Medicine

## 2019-08-24 DIAGNOSIS — W57XXXA Bitten or stung by nonvenomous insect and other nonvenomous arthropods, initial encounter: Secondary | ICD-10-CM | POA: Diagnosis not present

## 2019-08-24 DIAGNOSIS — S20361A Insect bite (nonvenomous) of right front wall of thorax, initial encounter: Secondary | ICD-10-CM | POA: Diagnosis not present

## 2019-08-24 NOTE — Telephone Encounter (Signed)
  Prescription Request  08/24/2019  What is the name of the medication or equipment? Fluid pills  Have you contacted your pharmacy to request a refill? (if applicable) yes  Which pharmacy would you like this sent to? [facility]   Patient notified that their request is being sent to the clinical staff for review and that they should receive a response within 2 business days.

## 2019-08-26 ENCOUNTER — Ambulatory Visit: Payer: PRIVATE HEALTH INSURANCE | Admitting: Nurse Practitioner

## 2019-08-31 ENCOUNTER — Telehealth: Payer: Self-pay | Admitting: Family Medicine

## 2019-08-31 ENCOUNTER — Telehealth: Payer: Self-pay | Admitting: *Deleted

## 2019-08-31 NOTE — Telephone Encounter (Signed)
Femring is non preferred on pt insurance-she was given a transitional supply  No alternative was listed

## 2019-09-01 ENCOUNTER — Telehealth: Payer: Self-pay | Admitting: Family Medicine

## 2019-09-01 NOTE — Telephone Encounter (Signed)
  Prescription Request  09/01/2019  What is the name of the medication or equipment? Fluid Pill  Have you contacted your pharmacy to request a refill? (if applicable) Yes  Which pharmacy would you like this sent to? [facility]  Pt is completely out of her fluid pill Rx and is requesting refills.   Patient notified that their request is being sent to the clinical staff for review and that they should receive a response within 2 business days.

## 2019-09-01 NOTE — Telephone Encounter (Signed)
We could try topical estrace or premarin if pt willing to try.  She is certainly very postmenopausal, so stopping estrogens totally is reasonable as well.  Please let me know what she wants to do

## 2019-09-01 NOTE — Telephone Encounter (Signed)
Number not working

## 2019-09-02 NOTE — Telephone Encounter (Signed)
Left message to please call our office. 

## 2019-09-02 NOTE — Telephone Encounter (Signed)
Fax from [facility] RF request for Furosemide 20 mg 1 QD

## 2019-09-03 DIAGNOSIS — M545 Low back pain: Secondary | ICD-10-CM | POA: Diagnosis not present

## 2019-09-03 DIAGNOSIS — Z79891 Long term (current) use of opiate analgesic: Secondary | ICD-10-CM | POA: Diagnosis not present

## 2019-09-03 DIAGNOSIS — M546 Pain in thoracic spine: Secondary | ICD-10-CM | POA: Diagnosis not present

## 2019-09-03 DIAGNOSIS — G894 Chronic pain syndrome: Secondary | ICD-10-CM | POA: Diagnosis not present

## 2019-09-03 DIAGNOSIS — M7918 Myalgia, other site: Secondary | ICD-10-CM | POA: Diagnosis not present

## 2019-09-03 DIAGNOSIS — Z79899 Other long term (current) drug therapy: Secondary | ICD-10-CM | POA: Diagnosis not present

## 2019-09-03 MED ORDER — FUROSEMIDE 20 MG PO TABS
20.0000 mg | ORAL_TABLET | Freq: Every day | ORAL | 1 refills | Status: DC | PRN
Start: 2019-09-03 — End: 2019-10-18

## 2019-09-03 NOTE — Telephone Encounter (Signed)
Left message for daughter to please call our office.

## 2019-09-08 ENCOUNTER — Telehealth: Payer: Self-pay | Admitting: Family Medicine

## 2019-09-08 NOTE — Telephone Encounter (Signed)
Appointment made with [facility] tomorrow.

## 2019-09-09 ENCOUNTER — Encounter: Payer: Self-pay | Admitting: Physician Assistant

## 2019-09-09 ENCOUNTER — Other Ambulatory Visit: Payer: Self-pay

## 2019-09-09 ENCOUNTER — Ambulatory Visit (INDEPENDENT_AMBULATORY_CARE_PROVIDER_SITE_OTHER): Payer: Medicare HMO | Admitting: Physician Assistant

## 2019-09-09 VITALS — BP 164/81 | HR 72 | Temp 97.2°F | Resp 20 | Ht 62.0 in | Wt 133.0 lb

## 2019-09-09 DIAGNOSIS — I10 Essential (primary) hypertension: Secondary | ICD-10-CM

## 2019-09-09 NOTE — Telephone Encounter (Signed)
Closing encounter Furosemide was refilled per fax request on 09/01/19

## 2019-09-09 NOTE — Patient Instructions (Signed)

## 2019-09-09 NOTE — Progress Notes (Signed)
  Subjective:     Patient ID: Kaitlyn Serrano, female   DOB: [birth year], 79 y.o.   MRN: [id number]  HPI Pt here for recheck of her BP  States her BP has been elevated and has had some lower ext edema She then went to get her Lasix filled that she uses on a prn basis Since using, her sx have resolved and she has noticed sig improvement in her BP readings   Review of Systems  Constitutional: Negative.   Respiratory: Negative.   Cardiovascular: Negative.   Neurological: Positive for headaches.       Objective:   Physical Exam Vitals and nursing note reviewed.  Constitutional:      General: She is not in acute distress.    Appearance: Normal appearance. She is not ill-appearing or toxic-appearing.  Cardiovascular:     Rate and Rhythm: Normal rate and regular rhythm.     Pulses: Normal pulses.     Heart sounds: Normal heart sounds.  Pulmonary:     Effort: Pulmonary effort is normal.     Breath sounds: Normal breath sounds.  Musculoskeletal:     Right lower leg: No edema.     Left lower leg: No edema.  Neurological:     Mental Status: She is alert.        Assessment:     1. Essential hypertension        Plan:     Continue currents meds Due to ins she says all meds have to be [facility]formed [facility] could do this on their own Cont current meds Keep reg f/u with reg provider

## 2019-09-13 DIAGNOSIS — H60392 Other infective otitis externa, left ear: Secondary | ICD-10-CM | POA: Diagnosis not present

## 2019-09-13 DIAGNOSIS — J40 Bronchitis, not specified as acute or chronic: Secondary | ICD-10-CM | POA: Diagnosis not present

## 2019-09-13 DIAGNOSIS — R05 Cough: Secondary | ICD-10-CM | POA: Diagnosis not present

## 2019-10-01 DIAGNOSIS — M545 Low back pain: Secondary | ICD-10-CM | POA: Diagnosis not present

## 2019-10-01 DIAGNOSIS — M5106 Intervertebral disc disorders with myelopathy, lumbar region: Secondary | ICD-10-CM | POA: Diagnosis not present

## 2019-10-01 DIAGNOSIS — M791 Myalgia, unspecified site: Secondary | ICD-10-CM | POA: Diagnosis not present

## 2019-10-01 DIAGNOSIS — M546 Pain in thoracic spine: Secondary | ICD-10-CM | POA: Diagnosis not present

## 2019-10-01 DIAGNOSIS — G894 Chronic pain syndrome: Secondary | ICD-10-CM | POA: Diagnosis not present

## 2019-10-12 ENCOUNTER — Other Ambulatory Visit: Payer: Self-pay | Admitting: Cardiology

## 2019-10-12 ENCOUNTER — Other Ambulatory Visit: Payer: Self-pay | Admitting: Family

## 2019-10-12 ENCOUNTER — Other Ambulatory Visit: Payer: Self-pay | Admitting: Family Medicine

## 2019-10-12 DIAGNOSIS — F411 Generalized anxiety disorder: Secondary | ICD-10-CM

## 2019-10-12 DIAGNOSIS — I10 Essential (primary) hypertension: Secondary | ICD-10-CM

## 2019-10-17 NOTE — Progress Notes (Signed)
Cardiology Office Note  Date: 10/18/2019   ID: Saryiah, Bencosme 1940/05/21, MRN [id number]  PCP:  Janora Norlander, DO  Cardiologist:  Carlyle Dolly, MD Electrophysiologist:  None   Chief Complaint: Follow-up palpitations  History of Present Illness: Kaitlyn Serrano is a 79 y.o. female with a history of palpitations, hypertension, CAD, chest pain, CHF, diabetes, dyspnea, chronic pain syndrome, hyperlipidemia, IBS, insomnia. Recent right total hip arthroplasty Dr Alvan Dame.  Last saw Dr. Harl Bowie 12/11/2018 via telemedicine for palpitations and hypertension.  Had a recent ER visit on 10/15/2018 with elevated blood pressures in the 180s associated chest pain.  Chest pain resolved with blood pressure control.  Her lisinopril was increased to 20 mg daily.  Her blood pressures were running 130s over 80 when taking the lisinopril.  She did experience some palpitations in the past 2017.  Had CT with which was negative for PE.  Negative evaluation for ACS she been started on diltiazem by her another provider and remained on atenolol.  Patient is here for follow-up.  She denies any recent acute illnesses or hospitalizations.  States she has not been here in a while because she had a recent right total hip arthroplasty.  States recently she has been having issues with increased blood pressures.  Blood pressure today on arrival was 160/78.  She states these are the type of readings she has had recently at home.  She denies any recent progressive anginal or exertional symptoms, palpitations or arrhythmias, orthostatic symptoms, dyspeptic symptoms, or blood in stool or urine.  Denies any claudication-like symptoms, DVT or PE-like symptoms, or lower extremity edema.  Denies any PND or orthopnea.  EKG today sinus bradycardia with left axis deviation, minimal voltage criteria for LVH possible anterior lateral infarct age undetermined, heart rate of 55.  States she has been having some mid thoracic back pain  aggravated by movement.  Denies any relationship to exertion.  Denies any associated nausea, vomiting, or diaphoresis.  States she has had multiple back surgeries in the past.  Past Medical History:  Diagnosis Date  . Benzodiazepine dependence ([place]) 12/30/2017  . Chronic pain syndrome   . Complication of anesthesia   . Diverticulosis   . 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 family unaware  . Dysphagia   . Dyspnea   . Generalized weakness   . GERD (gastroesophageal reflux disease)   . GERD (gastroesophageal reflux disease)   . High cholesterol   . Hoarseness   . Hx of cardiovascular stress test    negative 2012 and 2016  . Hypertension   . Insomnia   . Intervertebral disc disorder with myelopathy, lumbar region   . Lumbago   . Lumbar spine pain   . PONV (postoperative nausea and vomiting)    confusion after anesthesia  . Renal insufficiency   . Ringing in ears    resolved  . Spondylosis with myelopathy, lumbar region    legs, back    Past Surgical History:  Procedure Laterality Date  . ABDOMINAL HYSTERECTOMY    . BACK SURGERY    . BONE MARROW ASPIRATION    . CERVICAL SPINE SURGERY    . EYE SURGERY Bilateral    Lasic  . FIXATION KYPHOPLASTY    . LAMINOTOMY    . SPINAL FUSION    . 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   Procedure: TOTAL HIP ARTHROPLASTY ANTERIOR APPROACH;  Surgeon: Paralee Cancel, MD;  Location: [facility];  Service: Orthopedics;  Laterality: Right;  70  mins    Current Outpatient Medications  Medication Sig Dispense Refill  . acetaminophen (TYLENOL) 500 MG tablet Take 2 tablets (1,000 mg total) by mouth every 8 (eight) hours. 30 tablet 0  . atenolol (TENORMIN) 25 MG tablet Take 1 tablet (25 mg total) by mouth daily. 90 tablet 1  . busPIRone (BUSPAR) 10 MG tablet TAKE  (1)  TABLET  THREE TIMES DAILY. 90 tablet 0  . dicyclomine (BENTYL) 10 MG capsule TAKE  (1)  CAPSULE  TWICE DAILY (TAKE ON AN EMPTY STOMACH AT LEAST 30 MIN- UTES BEFORE MEALS). 60 capsule 0  .  diltiazem (TIAZAC) 120 MG 24 hr capsule TAKE (1) CAPSULE DAILY 90 capsule 0  . docusate sodium (COLACE) 100 MG capsule Take 1 capsule (100 mg total) by mouth 2 (two) times daily. 28 capsule 0  . DULoxetine (CYMBALTA) 30 MG capsule TAKE (1) CAPSULE DAILY 90 capsule 0  . EPINEPHrine 0.3 mg/0.3 mL IJ SOAJ injection Inject 0.3 mLs (0.3 mg total) into the muscle once. (Patient taking differently: Inject 0.3 mg into the muscle as needed for anaphylaxis. ) 2 Device 1  . FEMRING 0.05 MG/24HR RING INSERT (1) INTO THE VAGINA AS DIR- ECTED. -FOR VAGINAL USE- 1 each 3  . fluticasone (FLONASE) 50 MCG/ACT nasal spray Place 2 sprays into both nostrils daily. (Patient taking differently: Place 2 sprays into both nostrils daily as needed for allergies. ) 16 g 6  . lisinopril (ZESTRIL) 20 MG tablet Take 1 tablet (20 mg total) by mouth daily. 90 tablet 3  . loratadine (CLARITIN) 10 MG tablet Take 1 tablet (10 mg total) by mouth daily. 30 tablet 11  . oxyCODONE (OXY IR/ROXICODONE) 5 MG immediate release tablet Take 1-2 tablets (5-10 mg total) by mouth every 4 (four) hours as needed for moderate pain or severe pain. 60 tablet 0  . polyethylene glycol (MIRALAX / GLYCOLAX) 17 g packet Take 17 g by mouth 2 (two) times daily. 28 packet 0  . tiZANidine (ZANAFLEX) 2 MG tablet Take 1 mg by mouth in the morning, at noon, and at bedtime.    . traZODone (DESYREL) 50 MG tablet Take 0.5-1 tablets (25-50 mg total) by mouth at bedtime as needed for sleep. 30 tablet 3   No current facility-administered medications for this visit.   Allergies:  Bee venom, Penicillins, Amlodipine, Elavil [amitriptyline hcl], and Statins   Social History: The patient  reports that she quit smoking about 55 years ago. Her smoking use included cigarettes. She started smoking about 59 years ago. She has a 12.00 pack-year smoking history. She has never used smokeless tobacco. She reports that she does not drink alcohol and does not use drugs.   Family  History: The patient's family history includes Diabetes in her mother; Gallbladder disease in her mother and another family member; Heart disease in her father and mother; Hyperlipidemia in her brother; Hypertension in her brother, child, and mother; Pneumonia in her sister.   ROS:  Please see the history of present illness. Otherwise, complete review of systems is positive for none.  All other systems are reviewed and negative.   Physical Exam: VS:  BP (!) 160/78   Pulse 60   Ht 5\' 2"  (1.575 m)   Wt 135 lb (61.2 kg)   SpO2 98%   BMI 24.69 kg/m , BMI Body mass index is 24.69 kg/m.  Wt Readings from Last 3 Encounters:  10/18/19 135 lb (61.2 kg)  09/09/19 133 lb (60.3 kg)  08/19/19 134 lb  9.6 oz (61.1 kg)    General: Patient appears comfortable at rest. Neck: Supple, no elevated JVP or carotid bruits, no thyromegaly. Lungs: Clear to auscultation, nonlabored breathing at rest. Cardiac: Regular rate and rhythm, no S3 or significant systolic murmur, no pericardial rub. Extremities: No pitting edema, distal pulses 2+. Skin: Warm and dry. Musculoskeletal: No kyphosis.   Neuropsychiatric: Alert and oriented x3, affect grossly appropriate.  ECG:    Recent Labwork: 08/19/2019: ALT 9; AST 21; BUN 10; Creatinine, Ser 0.64; Hemoglobin 14.4; Platelets 245; Potassium 4.7; Sodium 139; TSH 0.783     Component Value Date/Time   CHOL 176 07/10/2018 1135   TRIG 113 07/10/2018 1135   HDL 51 07/10/2018 1135   CHOLHDL 3.5 07/10/2018 1135   LDLCALC 102 (H) 07/10/2018 1135    Other Studies Reviewed Today:  Echo 04/2010 [facility] LVEF >86%, grade I diastolic dysfunction, mild MR  04/2010 [facility] No ischemia. Exercised 8 minutes, 9 METs, 95% THR.  12/2007 Renal artery [place] No stenosis, normal kidney size.   06/01/13 [facility] EKG NSR, LAD  05/2014 Exercise MPI IMPRESSION: 1. No reversible ischemia or infarction.  2. Normal left ventricular wall motion.  3. Left ventricular  ejection fraction 57%  4. Low-risk stress test findings*.   08/2014 Echo Study Conclusions  - Left ventricle: The cavity size was normal. Wall thickness was normal. Systolic function was normal. The estimated ejection fraction was in the range of 60% to 65%. Wall motion was normal; there were no regional wall motion abnormalities. Doppler parameters are consistent with abnormal left ventricular relaxation (grade 1 diastolic dysfunction). - Aortic valve: Mildly calcified annulus. Trileaflet. - Mitral valve: Mildly calcified annulus. Mildly calcified leaflets . There was mild regurgitation. - Tricuspid valve: There was mild regurgitation  Assessment and Plan:  1. Essential hypertension   2. Palpitations    1. Essential hypertension States blood pressure has been elevated recently.  Increase lisinopril to 40 mg daily.  Start checking your blood pressure daily at home and write them down.  Come back in 2 weeks for nursing visit for blood pressure check.  Get a BMP in 2 weeks after starting increased dose of lisinopril.  2. Palpitations Denies any recent palpitations or arrhythmias.  EKG today shows sinus bradycardia rate of 55, LAD, minimal voltage criteria for LVH, possible anterior lateral infarct, age undetermined.  Medication Adjustments/Labs and Tests Ordered: Current medicines are reviewed at length with the patient today.  Concerns regarding medicines are outlined above.   Disposition: Follow-up with Dr. Harl Bowie or APP 6 months  Signed, Levell July, NP 10/18/2019 11:11 AM    [facility] [address], [place], [place] [address] Phone: [phone]; Fax: [phone]

## 2019-10-18 ENCOUNTER — Encounter: Payer: Self-pay | Admitting: Family Medicine

## 2019-10-18 ENCOUNTER — Ambulatory Visit (INDEPENDENT_AMBULATORY_CARE_PROVIDER_SITE_OTHER): Payer: Medicare Other | Admitting: Family Medicine

## 2019-10-18 ENCOUNTER — Other Ambulatory Visit: Payer: Self-pay

## 2019-10-18 VITALS — BP 160/78 | HR 60 | Ht 62.0 in | Wt 135.0 lb

## 2019-10-18 DIAGNOSIS — I10 Essential (primary) hypertension: Secondary | ICD-10-CM

## 2019-10-18 DIAGNOSIS — R002 Palpitations: Secondary | ICD-10-CM | POA: Diagnosis not present

## 2019-10-18 MED ORDER — LISINOPRIL 40 MG PO TABS: 40.0000 mg | ORAL_TABLET | Freq: Every day | ORAL | 6 refills | Status: DC

## 2019-10-18 NOTE — Patient Instructions (Addendum)
Medication Instructions:   Increase Lisinopril to 40mg  daily.  Continue all other medications.    Labwork:  BMET, Magnesium - orders given today.   Please do in 2 weeks - around 11/01/2019.  Office will contact with results via phone or letter.    Testing/Procedures: none  Follow-Up:  6 months  Any Other Special Instructions Will Be Listed Below (If Applicable). Your physician has requested that you regularly monitor and record your blood pressure readings at home. Please use the same machine at the same time of day to check your readings and record them to bring to your nurse visit in 2 weeks.    If you need a refill on your cardiac medications before your next appointment, please call your pharmacy.

## 2019-10-20 ENCOUNTER — Other Ambulatory Visit: Payer: Self-pay | Admitting: *Deleted

## 2019-10-20 DIAGNOSIS — I10 Essential (primary) hypertension: Secondary | ICD-10-CM

## 2019-10-20 MED ORDER — DILTIAZEM HCL ER BEADS 120 MG PO CP24: 120.0000 mg | ORAL_CAPSULE | Freq: Every day | ORAL | 3 refills | Status: DC

## 2019-10-20 MED ORDER — LISINOPRIL 40 MG PO TABS: 40.0000 mg | ORAL_TABLET | Freq: Every day | ORAL | 3 refills | Status: DC

## 2019-10-25 ENCOUNTER — Other Ambulatory Visit: Payer: Self-pay

## 2019-10-25 DIAGNOSIS — F411 Generalized anxiety disorder: Secondary | ICD-10-CM

## 2019-10-25 MED ORDER — DULOXETINE HCL 30 MG PO CPEP: ORAL_CAPSULE | ORAL | 0 refills | Status: DC

## 2019-10-25 MED ORDER — BUSPIRONE HCL 10 MG PO TABS: ORAL_TABLET | ORAL | 0 refills | Status: DC

## 2019-11-02 ENCOUNTER — Ambulatory Visit: Payer: Medicare Other

## 2019-11-07 ENCOUNTER — Other Ambulatory Visit: Payer: Self-pay | Admitting: Family Medicine

## 2019-11-07 DIAGNOSIS — F411 Generalized anxiety disorder: Secondary | ICD-10-CM

## 2019-11-23 ENCOUNTER — Encounter: Payer: Self-pay | Admitting: *Deleted

## 2019-11-24 ENCOUNTER — Other Ambulatory Visit: Payer: Self-pay

## 2019-11-24 ENCOUNTER — Telehealth: Payer: Self-pay | Admitting: Family Medicine

## 2019-11-24 ENCOUNTER — Other Ambulatory Visit: Payer: Self-pay | Admitting: *Deleted

## 2019-11-24 ENCOUNTER — Other Ambulatory Visit: Payer: Medicare HMO

## 2019-11-24 DIAGNOSIS — I10 Essential (primary) hypertension: Secondary | ICD-10-CM

## 2019-11-24 DIAGNOSIS — R002 Palpitations: Secondary | ICD-10-CM

## 2019-11-24 NOTE — Telephone Encounter (Signed)
Fax came over stating [place] medicare will not cover Femring. Formulary alternatives is estradiol, PREMARIN CREAM. Please advise.

## 2019-11-24 NOTE — Telephone Encounter (Signed)
Will put fax on your desk that came over from insurance.

## 2019-11-24 NOTE — Telephone Encounter (Signed)
I'm confused.  She was approved earlier this month, see May 2021 PA note.  Note Prior Auth for Femring 0.05mg /day Vag Ring-APPROVED till 12/321/21.   Pharmacy notified.

## 2019-11-29 ENCOUNTER — Telehealth: Payer: Self-pay

## 2019-11-29 NOTE — Telephone Encounter (Signed)
Patient states that she had labs done for cardiologist last week and would like those results.  Advised patient she would need to contact her cardiologist.

## 2019-11-30 ENCOUNTER — Telehealth: Payer: Self-pay | Admitting: *Deleted

## 2019-11-30 NOTE — Telephone Encounter (Signed)
-----   Message from Verta Ellen., NP sent at 11/29/2019  4:52 PM EDT ----- Patient's basic metabolic panel and magnesium look great.  Thanks

## 2019-12-02 NOTE — Telephone Encounter (Signed)
Patient informed. Copy sent to PCP °

## 2019-12-07 DIAGNOSIS — G894 Chronic pain syndrome: Secondary | ICD-10-CM | POA: Diagnosis not present

## 2019-12-07 DIAGNOSIS — Z6822 Body mass index (BMI) 22.0-22.9, adult: Secondary | ICD-10-CM | POA: Diagnosis not present

## 2019-12-07 DIAGNOSIS — M545 Low back pain, unspecified: Secondary | ICD-10-CM | POA: Diagnosis not present

## 2019-12-07 DIAGNOSIS — M7918 Myalgia, other site: Secondary | ICD-10-CM | POA: Diagnosis not present

## 2019-12-07 DIAGNOSIS — M5106 Intervertebral disc disorders with myelopathy, lumbar region: Secondary | ICD-10-CM | POA: Diagnosis not present

## 2019-12-08 ENCOUNTER — Other Ambulatory Visit: Payer: Self-pay | Admitting: Family Medicine

## 2019-12-08 DIAGNOSIS — F411 Generalized anxiety disorder: Secondary | ICD-10-CM

## 2019-12-15 DIAGNOSIS — M545 Low back pain, unspecified: Secondary | ICD-10-CM | POA: Diagnosis not present

## 2019-12-15 DIAGNOSIS — R69 Illness, unspecified: Secondary | ICD-10-CM | POA: Diagnosis not present

## 2019-12-15 DIAGNOSIS — J309 Allergic rhinitis, unspecified: Secondary | ICD-10-CM | POA: Diagnosis not present

## 2019-12-15 DIAGNOSIS — G3184 Mild cognitive impairment, so stated: Secondary | ICD-10-CM | POA: Diagnosis not present

## 2019-12-15 DIAGNOSIS — G4700 Insomnia, unspecified: Secondary | ICD-10-CM | POA: Diagnosis not present

## 2019-12-15 DIAGNOSIS — Z833 Family history of diabetes mellitus: Secondary | ICD-10-CM | POA: Diagnosis not present

## 2019-12-15 DIAGNOSIS — I10 Essential (primary) hypertension: Secondary | ICD-10-CM | POA: Diagnosis not present

## 2019-12-15 DIAGNOSIS — G8929 Other chronic pain: Secondary | ICD-10-CM | POA: Diagnosis not present

## 2019-12-15 DIAGNOSIS — Z8249 Family history of ischemic heart disease and other diseases of the circulatory system: Secondary | ICD-10-CM | POA: Diagnosis not present

## 2019-12-15 DIAGNOSIS — Z96641 Presence of right artificial hip joint: Secondary | ICD-10-CM | POA: Diagnosis not present

## 2019-12-17 ENCOUNTER — Other Ambulatory Visit: Payer: Self-pay

## 2019-12-17 ENCOUNTER — Ambulatory Visit (INDEPENDENT_AMBULATORY_CARE_PROVIDER_SITE_OTHER): Payer: Medicare HMO | Admitting: Family Medicine

## 2019-12-17 VITALS — BP 139/81 | HR 66 | Temp 97.1°F | Ht 62.0 in | Wt 140.0 lb

## 2019-12-17 DIAGNOSIS — R413 Other amnesia: Secondary | ICD-10-CM | POA: Diagnosis not present

## 2019-12-17 DIAGNOSIS — J301 Allergic rhinitis due to pollen: Secondary | ICD-10-CM | POA: Diagnosis not present

## 2019-12-17 DIAGNOSIS — F5104 Psychophysiologic insomnia: Secondary | ICD-10-CM

## 2019-12-17 DIAGNOSIS — F411 Generalized anxiety disorder: Secondary | ICD-10-CM

## 2019-12-17 DIAGNOSIS — R69 Illness, unspecified: Secondary | ICD-10-CM | POA: Diagnosis not present

## 2019-12-17 MED ORDER — TRAZODONE HCL 50 MG PO TABS: 75.0000 mg | ORAL_TABLET | Freq: Every evening | ORAL | 3 refills | Status: DC | PRN

## 2019-12-17 MED ORDER — FLUTICASONE PROPIONATE 50 MCG/ACT NA SUSP: 2.0000 | Freq: Every day | NASAL | 12 refills | Status: DC | PRN

## 2019-12-17 NOTE — Progress Notes (Signed)
Subjective: CC: Follow-up memory, anxiety/insomnia PCP: Janora Norlander, DO WIO:[id number] T Kaitlyn Serrano is a 79 y.o. female presenting to clinic today for:  1.  Anxiety/insomnia/memory loss At last visit, patient was started back on her trazodone.  She was continued on her buspirone and Cymbalta.  Her MMSE score was abnormal at 16.  She was referred to neurology for further evaluation of this cognitive impairment.  Apparently there were a couple voicemails left by the neurologic office and upon the last attempt at the "patient's number was no longer in service".   She is accompanied today by her daughter.  Patient reports she is doing well. Her sleep has been ok with restart of the trazodone.  She does report that she took 1/2 tablet from another pill pack to combine with her 50 mg because she was not sleeping as good as she wanted to.  She did have an uninterrupted sleep on this dose and has not reported any problems in combination with the Cymbalta that she takes.  She has had her influenza shot.  She needs refills on her allergy medicine.  ROS: Per HPI  Allergies  Allergen Reactions  . Bee Venom Anaphylaxis  . Penicillins Anaphylaxis and Rash    Has patient had a PCN reaction causing immediate rash, facial/tongue/throat swelling, SOB or lightheadedness with hypotension: Yes Has patient had a PCN reaction causing severe rash involving mucus membranes or skin necrosis: Yes Has patient had a PCN reaction that required hospitalization: Yes Has patient had a PCN reaction occurring within the last 10 years: No If all of the above answers are "NO", then may proceed with Cephalosporin use.   . Amlodipine Swelling  . Elavil [Amitriptyline Hcl] Other (See Comments)    Confusion, hallucinations  . Statins Swelling and Other (See Comments)    Peeling of skin   Past Medical History:  Diagnosis Date  . 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 Chronic pain syndrome   . Complication of  anesthesia   . Diverticulosis   . 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 family unaware  . Dysphagia   . Dyspnea   . Generalized weakness   . GERD (gastroesophageal reflux disease)   . GERD (gastroesophageal reflux disease)   . High cholesterol   . Hoarseness   . Hx of cardiovascular stress test    negative 2012 and 2016  . Hypertension   . Insomnia   . Intervertebral disc disorder with myelopathy, lumbar region   . Lumbago   . Lumbar spine pain   . PONV (postoperative nausea and vomiting)    confusion after anesthesia  . Renal insufficiency   . Ringing in ears    resolved  . Spondylosis with myelopathy, lumbar region    legs, back    Current Outpatient Medications:  .  acetaminophen (TYLENOL) 500 MG tablet, Take 2 tablets (1,000 mg total) by mouth every 8 (eight) hours., Disp: 30 tablet, Rfl: 0 .  atenolol (TENORMIN) 25 MG tablet, Take 1 tablet (25 mg total) by mouth daily., Disp: 90 tablet, Rfl: 1 .  busPIRone (BUSPAR) 10 MG tablet, TAKE  (1)  TABLET  THREE TIMES DAILY., Disp: 90 tablet, Rfl: 0 .  dicyclomine (BENTYL) 10 MG capsule, TAKE  (1)  CAPSULE  TWICE DAILY (TAKE ON AN EMPTY STOMACH AT LEAST 30 MIN- UTES BEFORE MEALS)., Disp: 60 capsule, Rfl: 0 .  diltiazem (TIAZAC) 120 MG 24 hr capsule, Take 1 capsule (120 mg total) by mouth daily., Disp: 90 capsule, Rfl:  3 .  docusate sodium (COLACE) 100 MG capsule, Take 1 capsule (100 mg total) by mouth 2 (two) times daily., Disp: 28 capsule, Rfl: 0 .  DULoxetine (CYMBALTA) 30 MG capsule, Take one capsule daily, Disp: 90 capsule, Rfl: 0 .  EPINEPHrine 0.3 mg/0.3 mL IJ SOAJ injection, Inject 0.3 mLs (0.3 mg total) into the muscle once. (Patient taking differently: Inject 0.3 mg into the muscle as needed for anaphylaxis. ), Disp: 2 Device, Rfl: 1 .  FEMRING 0.05 MG/24HR RING, INSERT (1) INTO THE VAGINA AS DIR- ECTED. -FOR VAGINAL USE-, Disp: 1 each, Rfl: 3 .  fluticasone (FLONASE) 50 MCG/ACT nasal spray, Place 2 sprays into both nostrils  daily. (Patient taking differently: Place 2 sprays into both nostrils daily as needed for allergies. ), Disp: 16 g, Rfl: 6 .  lisinopril (ZESTRIL) 40 MG tablet, Take 1 tablet (40 mg total) by mouth daily., Disp: 90 tablet, Rfl: 3 .  loratadine (CLARITIN) 10 MG tablet, Take 1 tablet (10 mg total) by mouth daily., Disp: 30 tablet, Rfl: 11 .  oxyCODONE (OXY IR/ROXICODONE) 5 MG immediate release tablet, Take 1-2 tablets (5-10 mg total) by mouth every 4 (four) hours as needed for moderate pain or severe pain., Disp: 60 tablet, Rfl: 0 .  polyethylene glycol (MIRALAX / GLYCOLAX) 17 g packet, Take 17 g by mouth 2 (two) times daily., Disp: 28 packet, Rfl: 0 .  tiZANidine (ZANAFLEX) 2 MG tablet, Take 1 mg by mouth in the morning, at noon, and at bedtime., Disp: , Rfl:  .  traZODone (DESYREL) 50 MG tablet, Take 0.5-1 tablets (25-50 mg total) by mouth at bedtime as needed for sleep., Disp: 30 tablet, Rfl: 3 Social History   Socioeconomic History  . Marital status: Divorced    Spouse name: Not on file  . Number of children: 2  . Years of education: Not on file  . Highest education level: Not on file  Occupational History  . Not on file  Tobacco Use  . Smoking status: Former Smoker    Packs/day: 4.00    Years: 3.00    Pack years: 12.00    Types: Cigarettes    Start date: 06/10/1960    Quit date: 02/20/1964    Years since quitting: 55.8  . Smokeless tobacco: Never Used  Vaping Use  . Vaping Use: Never used  Substance and Sexual Activity  . Alcohol use: No    Alcohol/week: 0.0 standard drinks  . Drug use: No  . Sexual activity: Not Currently    Birth control/protection: None  Other Topics Concern  . Not on file  Social History Narrative   ** Merged History Encounter **      [name] lives alone. She is separated/divorced from her second husband and widowed by her first husband. She has twin daughters, Kaitlyn Serrano and Kaitlyn Serrano. Kaitlyn Serrano has been a cause of stress for her and she believes was  preventing her from getting medical care that she needed. She is no longer talking to [place]. She states that Kaitlyn Serrano is involved in her care and that she trusts her to have her best interest in mind.        Social Determinants of Health   Financial Resource Strain:   . Difficulty of Paying Living Expenses: Not on file  Food Insecurity:   . Worried About Charity fundraiser in the Last Year: Not on file  . Ran Out of Food in the Last Year: Not on file  Transportation Needs:   .  Lack of Transportation (Medical): Not on file  . Lack of Transportation (Non-Medical): Not on file  Physical Activity:   . Days of Exercise per Week: Not on file  . Minutes of Exercise per Session: Not on file  Stress:   . Feeling of Stress : Not on file  Social Connections:   . Frequency of Communication with Friends and Family: Not on file  . Frequency of Social Gatherings with Friends and Family: Not on file  . Attends Religious Services: Not on file  . Active Member of Clubs or Organizations: Not on file  . Attends Archivist Meetings: Not on file  . Marital Status: Not on file  Intimate Partner Violence:   . Fear of Current or Ex-Partner: Not on file  . Emotionally Abused: Not on file  . Physically Abused: Not on file  . Sexually Abused: Not on file   Family History  Problem Relation Age of Onset  . Diabetes Mother   . Heart disease Mother   . Hypertension Mother   . Gallbladder disease Mother   . Heart disease Father   . Pneumonia Sister   . Hypertension Brother   . Hyperlipidemia Brother   . Gallbladder disease Other   . Hypertension Child     Objective: Office vital signs reviewed. BP 139/81   Pulse 66   Temp (!) 97.1 F (36.2 C) (Temporal)   Ht 5\' 2"  (1.575 m)   Wt 140 lb (63.5 kg)   SpO2 93%   BMI 25.61 kg/m   Physical Examination:  General: Awake, alert, nontoxic, No acute distress HEENT: Normal, sclera white, MMM, no nasal drainage Cardio: regular rate and  rhythm, S1S2 heard, no murmurs appreciated Pulm: clear to auscultation bilaterally, no wheezes, rhonchi or rales; normal work of breathing on room air Psych: Mood stable.  Patient is pleasant and interactive.  Thought process seems linear today. Neuro: Follows commands. MMSE - Mini Mental State Exam 12/17/2019 08/20/2019  Orientation to time 5 3  Orientation to Place 5 5  Registration 3 0  Attention/ Calculation 2 0  Recall 2 2  Language- name 2 objects 2 2  Language- repeat 1 0  Language- follow 3 step command 3 3  Language- read & follow direction 1 1  Write a sentence 1 0  Copy design 1 0  Total score 26 16   Depression screen PHQ 2/9 12/17/2019 08/19/2019 04/29/2019 11/04/2018 10/01/2018  Decreased Interest 0 0 0 0 0  Down, Depressed, Hopeless 0 0 0 0 0  PHQ - 2 Score 0 0 0 0 0  Altered sleeping 0 - 0 - -  Tired, decreased energy 0 - 0 - -  Change in appetite 0 - 0 - -  Feeling bad or failure about yourself  0 - 0 - -  Trouble concentrating 0 - 0 - -  Moving slowly or fidgety/restless 0 - 0 - -  Suicidal thoughts 0 - 0 - -  PHQ-9 Score 0 - 0 - -  Some recent data might be hidden    Assessment/ Plan: 79 y.o. female   GAD (generalized anxiety disorder)  Psychophysiological insomnia - Plan: traZODone (DESYREL) 50 MG tablet  Seasonal allergic rhinitis due to pollen - Plan: fluticasone (FLONASE) 50 MCG/ACT nasal spray  Memory change  Anxiety and depression are stable with Cymbalta.  She is not had any concerning symptoms or signs suggestive of serotonin syndrome with combination of trazodone.  I have adjusted her medication to reflect  the 75 mg dose that she has been taking.  She simply needed refills of the Flonase but allergic rhinitis is stable.  With regards to her memory changes and abnormal MMSE that was noted back in June, this seems to have substantially improved.  Uncertain if this was a reflection of uncontrolled mental health issues/sleep issues at the time.   However, at this point she does not have evidence of a true dementia.  No orders of the defined types were placed in this encounter.  No orders of the defined types were placed in this encounter.    Janora Norlander, DO [place] [phone]

## 2020-01-03 ENCOUNTER — Other Ambulatory Visit: Payer: Self-pay | Admitting: Family Medicine

## 2020-01-03 ENCOUNTER — Other Ambulatory Visit: Payer: Self-pay | Admitting: Cardiology

## 2020-01-03 DIAGNOSIS — I10 Essential (primary) hypertension: Secondary | ICD-10-CM

## 2020-01-03 DIAGNOSIS — F411 Generalized anxiety disorder: Secondary | ICD-10-CM

## 2020-01-03 MED ORDER — DILTIAZEM HCL ER BEADS 120 MG PO CP24: 120.0000 mg | ORAL_CAPSULE | Freq: Every day | ORAL | 1 refills | Status: DC

## 2020-01-03 NOTE — Telephone Encounter (Signed)
New message      *STAT* If patient is at the pharmacy, call can be transferred to refill team.   1. Which medications need to be refilled? (please list name of each medication and dose if known) diltiazem (TIAZAC) 120 MG 24 hr capsule 2. Which pharmacy/location (including street and city if local pharmacy) is medication to be sent to? [place]   3. Do they need a 30 day or 90 day supply? [place]

## 2020-01-03 NOTE — Telephone Encounter (Signed)
Medication sent to pharmacy  

## 2020-01-04 DIAGNOSIS — G894 Chronic pain syndrome: Secondary | ICD-10-CM | POA: Diagnosis not present

## 2020-01-04 DIAGNOSIS — M5106 Intervertebral disc disorders with myelopathy, lumbar region: Secondary | ICD-10-CM | POA: Diagnosis not present

## 2020-01-04 DIAGNOSIS — M791 Myalgia, unspecified site: Secondary | ICD-10-CM | POA: Diagnosis not present

## 2020-01-05 ENCOUNTER — Ambulatory Visit: Payer: Medicare HMO

## 2020-01-10 ENCOUNTER — Other Ambulatory Visit: Payer: Self-pay | Admitting: Family

## 2020-01-10 DIAGNOSIS — F411 Generalized anxiety disorder: Secondary | ICD-10-CM

## 2020-01-26 ENCOUNTER — Ambulatory Visit: Payer: Medicare HMO

## 2020-01-28 ENCOUNTER — Ambulatory Visit (INDEPENDENT_AMBULATORY_CARE_PROVIDER_SITE_OTHER): Payer: Medicare HMO

## 2020-01-28 ENCOUNTER — Ambulatory Visit (INDEPENDENT_AMBULATORY_CARE_PROVIDER_SITE_OTHER): Payer: Medicare HMO | Admitting: Family Medicine

## 2020-01-28 VITALS — BP 163/86 | HR 65 | Temp 97.6°F

## 2020-01-28 DIAGNOSIS — S51812A Laceration without foreign body of left forearm, initial encounter: Secondary | ICD-10-CM | POA: Diagnosis not present

## 2020-01-28 DIAGNOSIS — R0789 Other chest pain: Secondary | ICD-10-CM | POA: Diagnosis not present

## 2020-01-28 DIAGNOSIS — M79632 Pain in left forearm: Secondary | ICD-10-CM | POA: Diagnosis not present

## 2020-01-28 DIAGNOSIS — W19XXXA Unspecified fall, initial encounter: Secondary | ICD-10-CM

## 2020-01-28 DIAGNOSIS — R0781 Pleurodynia: Secondary | ICD-10-CM | POA: Diagnosis not present

## 2020-01-28 MED ORDER — DICLOFENAC SODIUM 75 MG PO TBEC: 75.0000 mg | DELAYED_RELEASE_TABLET | Freq: Two times a day (BID) | ORAL | 2 refills | Status: DC

## 2020-01-28 NOTE — Progress Notes (Signed)
Subjective:  Patient ID: Kaitlyn Serrano, female    DOB: 1940-05-27  Age: 79 y.o. MRN: [id number]  CC: Chest Pain (Patient states she fell off her bed 10 mins ago. C/O left forearm pain and left rib pain) and Arm Pain   HPI [facility] presents for pain in the left forearm after alling of her bed. She was trying to hang draperies and lost her balance falling to the floor. Now the forearm is painful. She tore the skin of the forearm as well.   Depression screen [facility] 2/9 12/17/2019 08/19/2019 04/29/2019  Decreased Interest 0 0 0  Down, Depressed, Hopeless 0 0 0  PHQ - 2 Score 0 0 0  Altered sleeping 0 - 0  Tired, decreased energy 0 - 0  Change in appetite 0 - 0  Feeling bad or failure about yourself  0 - 0  Trouble concentrating 0 - 0  Moving slowly or fidgety/restless 0 - 0  Suicidal thoughts 0 - 0  PHQ-9 Score 0 - 0  Some recent data might be hidden    History Kathey has a past medical history of Benzodiazepine dependence ([place]) (12/30/2017), Chronic pain syndrome, Complication of anesthesia, Diverticulosis, 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   Her family history includes Diabetes in her mother; Gallbladder disease in her mother and another family member; Heart disease in her father and mother; Hyperlipidemia in her brother;  Hypertension in her brother, child, and mother; Pneumonia in her sister.She reports that she quit smoking about 55 years ago. Her smoking use included cigarettes. She started smoking about 59 years ago. She has a 12.00 pack-year smoking history. She has never used smokeless tobacco. She reports that she does not drink alcohol and does not use drugs.    ROS Review of Systems  Constitutional: Negative.   HENT: Negative.   Eyes: Negative for visual disturbance.  Respiratory: Negative for shortness of breath.   Cardiovascular: Negative for chest pain.  Gastrointestinal: Negative for abdominal pain.  Musculoskeletal: Positive for arthralgias (left forearm and elbow. ).  Skin: Positive for wound ( ).    Objective:  BP (!) 163/86   Pulse 65   Temp 97.6 F (36.4 C) (Temporal)   BP Readings from Last 3 Encounters:  01/28/20 (!) 163/86  12/17/19 139/81  10/18/19 (!) 160/78    Wt Readings from Last 3 Encounters:  12/17/19 140 lb (63.5 kg)  10/18/19 135 lb (61.2 kg)  09/09/19 133 lb (60.3 kg)     Physical Exam Constitutional:      General: She is not in acute distress.    Appearance: She is well-developed.  Cardiovascular:     Rate and Rhythm: Normal rate and regular rhythm.  Pulmonary:     Breath sounds: Normal breath sounds.  Musculoskeletal:     Left lower leg: Tenderness (left forearm. ) present.  Skin:    General: Skin is warm and dry.     Comments: skin tear, 8 cm left dorsal forearm. a second is present at the elbow region.  Neurological:     Mental Status: She is alert and oriented to person, place, and time.           Assessment & Plan:      I am having Velia Meyer start on diclofenac. I am also having her maintain her EPINEPHrine, loratadine, atenolol, tiZANidine, docusate sodium, polyethylene glycol, oxyCODONE, acetaminophen, dicyclomine, Femring, lisinopril, traZODone, fluticasone, diltiazem, busPIRone, and DULoxetine.  Allergies as of 01/28/2020       Reactions   Bee Venom Anaphylaxis   Hydrocodone Swelling   Penicillins Anaphylaxis, Rash   Has patient had a PCN reaction causing immediate rash, facial/tongue/throat swelling, SOB or lightheadedness with hypotension: Yes Has patient had a PCN reaction causing severe rash involving mucus membranes or skin necrosis: Yes Has patient had a PCN reaction that required hospitalization: Yes Has patient had a PCN reaction occurring within the last 10 years: No If all of the above answers are "NO", then may proceed with Cephalosporin use.   Amlodipine Swelling   Elavil [amitriptyline Hcl] Other (See Comments)   Confusion, hallucinations   Statins Swelling, Other (See Comments)   Peeling of skin      Medication List       Accurate as of January 28, 2020 11:59 PM. If you have any questions, ask your nurse or doctor.        acetaminophen 500 MG tablet Commonly known as: TYLENOL Take 2 tablets (1,000 mg total) by mouth every 8 (eight) hours.   atenolol 25 MG tablet Commonly known as: TENORMIN Take 1 tablet (25 mg total) by mouth daily.   busPIRone 10 MG tablet Commonly known as: BUSPAR TAKE  (1)  TABLET  THREE TIMES DAILY.   diclofenac 75 MG EC tablet Commonly known as: VOLTAREN Take 1 tablet (75 mg total) by mouth 2 (two) times daily. For muscle and  Joint pain Started by: Claretta Fraise, MD   dicyclomine 10 MG capsule Commonly known as: BENTYL TAKE  (1)  CAPSULE  TWICE DAILY (TAKE ON AN EMPTY STOMACH AT LEAST 30 MIN- UTES BEFORE MEALS).   diltiazem 120 MG 24 hr capsule Commonly known as: TIAZAC Take 1 capsule (120 mg total) by mouth daily.   docusate sodium 100 MG capsule Commonly known as: Colace Take 1 capsule (100 mg total) by mouth 2 (two) times daily.   DULoxetine 30 MG capsule Commonly known as: CYMBALTA TAKE (1) CAPSULE DAILY   EPINEPHrine 0.3 mg/0.3 mL Soaj injection Commonly known as: EPI-PEN Inject 0.3 mLs (0.3 mg total) into the muscle once. What  changed:   when to take this  reasons to take this   Femring 0.05 MG/24HR Ring Generic drug: Estradiol Acetate INSERT (1) INTO THE VAGINA AS DIR- ECTED. -FOR VAGINAL USE-   fluticasone 50 MCG/ACT nasal spray Commonly known as: FLONASE Place 2 sprays into both nostrils daily as needed for allergies.   lisinopril 40 MG tablet Commonly known as: ZESTRIL Take 1 tablet (40 mg total) by mouth daily.   loratadine 10 MG tablet Commonly known as: CLARITIN Take 1 tablet (10 mg total) by mouth daily.   oxyCODONE 5 MG immediate release tablet Commonly known as: Oxy IR/ROXICODONE Take 1-2 tablets (5-10 mg total) by mouth every 4 (four) hours as needed for moderate pain or severe pain.   polyethylene glycol 17 g packet Commonly known as: MIRALAX / GLYCOLAX Take 17 g by mouth 2 (two) times daily.   tiZANidine 2 MG tablet Commonly known as: ZANAFLEX Take 1 mg by  mouth in the morning, at noon, and at bedtime.   traZODone 50 MG tablet Commonly known as: DESYREL Take 1.5 tablets (75 mg total) by mouth at bedtime as needed for sleep.        Follow-up: Return if symptoms worsen or fail to improve.  Claretta Fraise, M.D.

## 2020-01-31 ENCOUNTER — Telehealth: Payer: Self-pay | Admitting: *Deleted

## 2020-01-31 NOTE — Telephone Encounter (Signed)
VM received from patient that she was seen the other day for a fall and now her arm is infected, she wants an abx There was no phone number left to call back. Number listed in chart can not be completed as dialed, tried several times . Also tried number on caller ID [phone] with NA/NVM

## 2020-02-01 ENCOUNTER — Encounter: Payer: Self-pay | Admitting: Family Medicine

## 2020-02-01 DIAGNOSIS — G894 Chronic pain syndrome: Secondary | ICD-10-CM | POA: Diagnosis not present

## 2020-02-01 DIAGNOSIS — M791 Myalgia, unspecified site: Secondary | ICD-10-CM | POA: Diagnosis not present

## 2020-02-01 DIAGNOSIS — M5106 Intervertebral disc disorders with myelopathy, lumbar region: Secondary | ICD-10-CM | POA: Diagnosis not present

## 2020-02-02 ENCOUNTER — Other Ambulatory Visit: Payer: Self-pay | Admitting: Family

## 2020-02-11 ENCOUNTER — Telehealth: Payer: Self-pay | Admitting: Cardiology

## 2020-02-11 NOTE — Telephone Encounter (Signed)
New message     Pt c/o of Chest Pain: STAT if CP now or developed within 24 hours  1. Are you having CP right now?  yes  2. Are you experiencing any other symptoms (ex. SOB, nausea, vomiting, sweating)? Sob and chest pain   3. How long have you been experiencing CP?  4 days  4. Is your CP continuous or coming and going? continuous   5. Have you taken Nitroglycerin? no ? Daughter said she fell 2 weeks ago - and pain would come and go but has gotten worse now.   Setup appt for [place] on 02/17/20

## 2020-02-11 NOTE — Telephone Encounter (Signed)
Patient number not working. Contacted daughter [name] who called and is not with patient and neither on DPR. Advised that patient needed to go to the ED for Chest pain and SOB. Advised that DPR needed to be signed when back in office. Verbalized understanding.

## 2020-02-16 ENCOUNTER — Other Ambulatory Visit: Payer: Self-pay | Admitting: Family

## 2020-02-16 NOTE — Progress Notes (Signed)
Cardiology Office Note  Date: 02/17/2020   ID: [name], [name] 10-12-1940, MRN [id number]  PCP:  Raliegh Ip, DO  Cardiologist:  Dina Rich, MD Electrophysiologist:  None   Chief Complaint: Follow-up palpitations  History of Present Illness: Kaitlyn Serrano is a 79 y.o. female with a history of palpitations, hypertension, CAD, chest pain, CHF, diabetes, dyspnea, chronic pain syndrome, hyperlipidemia, IBS, insomnia. Recent right total hip arthroplasty Dr Charlann Boxer.  Last saw Dr. Wyline Mood 12/11/2018 via telemedicine for palpitations and hypertension.  Had a recent ER visit on 10/15/2018 with elevated blood pressures in the 180s associated chest pain.  Chest pain resolved with blood pressure control.  Her lisinopril was increased to 20 mg daily.  Her blood pressures were running 130s over 80 when taking the lisinopril.  She did experience some palpitations in the past 2017.  Had CT with which was negative for PE.  Negative evaluation for ACS.  She was started on diltiazem by another provider and remained on atenolol..  Last here October 18, 2019 for follow-up.  She denied any recent acute illnesses or hospitalizations.  Stated she had not been here in a while because she had a recent right total hip arthroplasty.  Stated she had recently been having issues with increased blood pressures.  Blood pressure on arrival was 160/78.  She stated these were the type of readings she had recently at home.  She denied any recent progressive anginal or exertional symptoms, palpitations or arrhythmias, orthostatic symptoms, dyspeptic symptoms, or blood in stool or urine.  Denied any claudication-like symptoms, DVT or PE-like symptoms, or lower extremity edema.  Denied any PND or orthopnea.  EKG  sinus bradycardia with left axis deviation, minimal voltage criteria for LVH possible anterior lateral infarct age undetermined, heart rate of 55.  Stated she had been having some mid thoracic back pain  aggravated by movement.  Denied any relationship to exertion.  Denied any associated nausea, vomiting, or diaphoresis.  Stated she had  multiple back surgeries in the past.  Telephone encounter on 02/11/2020 with nursing staff.  She was complaining of chest pain.  She described shortness of breath and chest pain.  Stated had been occurring over the last few days.  It was continuous.  She had not taken any nitroglycerin.  Daughter stated she fell 2 weeks prior and chest pain would come and go but had gotten worse recently.  She is presenting today with same complaints.  She had a chest x-ray and a left forearm x-ray at [facility].  Both showed no new fractures.  She had some old rib fractures noted on chest x-ray.  She continues to complain of left axillary chest wall pain aggravated by taking deep breaths and movement.  Aggravated by deep palpation.  Denies any associated chest pressure, tightness, pain on exertion.  Denies any associated radiation to neck, arm, back or jaw.  Denies any associated nausea, vomiting, or diaphoresis.  Denies any CVA or TIA-like symptoms, palpitations or arrhythmias, orthostatic symptoms, CVA or TIA-like symptoms, bleeding, denies any claudication-like symptoms, DVT or PE-like symptoms.  EKG shows sinus bradycardia with left axis deviation rate of 58.  No acute ST or T wave abnormalities.  Blood pressure is elevated on arrival.  States has taken all of her blood pressure medications today.  Previous blood pressure on 01/28/2020 was 163/86.  Today's blood pressure 150/90.  Past Medical History:  Diagnosis Date  . Benzodiazepine dependence (HCC) 12/30/2017  . Chronic  pain syndrome   . Complication of anesthesia   . Diverticulosis   . 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 family unaware  . Dysphagia   . Dyspnea   . Generalized weakness   . GERD (gastroesophageal reflux disease)   . GERD (gastroesophageal reflux disease)   . High cholesterol   . Hoarseness    . Hx of cardiovascular stress test    negative 2012 and 2016  . Hypertension   . Insomnia   . Intervertebral disc disorder with myelopathy, lumbar region   . Lumbago   . Lumbar spine pain   . PONV (postoperative nausea and vomiting)    confusion after anesthesia  . Renal insufficiency   . Ringing in ears    resolved  . Spondylosis with myelopathy, lumbar region    legs, back    Past Surgical History:  Procedure Laterality Date  . ABDOMINAL HYSTERECTOMY    . BACK SURGERY    . BONE MARROW ASPIRATION    . CERVICAL SPINE SURGERY    . EYE SURGERY Bilateral    Lasic  . FIXATION KYPHOPLASTY    . LAMINOTOMY    . SPINAL FUSION    . 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   Procedure: TOTAL HIP ARTHROPLASTY ANTERIOR APPROACH;  Surgeon: Paralee Cancel, MD;  Location: [facility];  Service: Orthopedics;  Laterality: Right;  70 mins    Current Outpatient Medications  Medication Sig Dispense Refill  . acetaminophen (TYLENOL) 500 MG tablet Take 2 tablets (1,000 mg total) by mouth every 8 (eight) hours. 30 tablet 0  . atenolol (TENORMIN) 25 MG tablet TAKE 1 TABLET DAILY 90 tablet 0  . busPIRone (BUSPAR) 10 MG tablet TAKE  (1)  TABLET  THREE TIMES DAILY. 90 tablet 1  . diclofenac (VOLTAREN) 75 MG EC tablet Take 1 tablet (75 mg total) by mouth 2 (two) times daily. For muscle and  Joint pain 60 tablet 2  . dicyclomine (BENTYL) 10 MG capsule TAKE  (1)  CAPSULE  TWICE DAILY (TAKE ON AN EMPTY STOMACH AT LEAST 30 MIN- UTES BEFORE MEALS). 60 capsule 0  . diltiazem (TIAZAC) 120 MG 24 hr capsule Take 1 capsule (120 mg total) by mouth daily. 90 capsule 1  . docusate sodium (COLACE) 100 MG capsule Take 1 capsule (100 mg total) by mouth 2 (two) times daily. 28 capsule 0  . DULoxetine (CYMBALTA) 30 MG capsule TAKE (1) CAPSULE DAILY 90 capsule 0  . EPINEPHrine 0.3 mg/0.3 mL IJ SOAJ injection Inject 0.3 mLs (0.3 mg total) into the muscle once. (Patient taking differently: Inject 0.3 mg into the muscle as needed  for anaphylaxis. ) 2 Device 1  . Estradiol Acetate (FEMRING) 0.05 MG/24HR RING INSERT VAGINALLY AS DIRECTED EVERY 3 MONTHS 1 each 0  . fluticasone (FLONASE) 50 MCG/ACT nasal spray Place 2 sprays into both nostrils daily as needed for allergies. 16 g 12  . lisinopril (ZESTRIL) 40 MG tablet Take 1 tablet (40 mg total) by mouth daily. 90 tablet 3  . loratadine (CLARITIN) 10 MG tablet Take 1 tablet (10 mg total) by mouth daily. 30 tablet 11  . oxyCODONE (OXY IR/ROXICODONE) 5 MG immediate release tablet Take 1-2 tablets (5-10 mg total) by mouth every 4 (four) hours as needed for moderate pain or severe pain. 60 tablet 0  . polyethylene glycol (MIRALAX / GLYCOLAX) 17 g packet Take 17 g by mouth 2 (two) times daily. 28 packet 0  . tiZANidine (ZANAFLEX) 2 MG tablet Take 1 mg by  mouth in the morning, at noon, and at bedtime.    . traZODone (DESYREL) 50 MG tablet Take 1.5 tablets (75 mg total) by mouth at bedtime as needed for sleep. 45 tablet 3   No current facility-administered medications for this visit.   Allergies:  Bee venom, Hydrocodone, Penicillins, Amlodipine, Elavil [amitriptyline hcl], and Statins   Social History: The patient  reports that she quit smoking about 56 years ago. Her smoking use included cigarettes. She started smoking about 59 years ago. She has a 12.00 pack-year smoking history. She has never used smokeless tobacco. She reports that she does not drink alcohol and does not use drugs.   Family History: The patient's family history includes Diabetes in her mother; Gallbladder disease in her mother and another family member; Heart disease in her father and mother; Hyperlipidemia in her brother; Hypertension in her brother, child, and mother; Pneumonia in her sister.   ROS:  Please see the history of present illness. Otherwise, complete review of systems is positive for none.  All other systems are reviewed and negative.   Physical Exam: VS:  Ht 5\' 2"  (1.575 m)   BMI 25.61 kg/m ,  BMI Body mass index is 25.61 kg/m.  Wt Readings from Last 3 Encounters:  12/17/19 140 lb (63.5 kg)  10/18/19 135 lb (61.2 kg)  09/09/19 133 lb (60.3 kg)    General: Patient appears comfortable at rest. Neck: Supple, no elevated JVP or carotid bruits, no thyromegaly. Lungs: Clear to auscultation, nonlabored breathing at rest. Cardiac: Regular rate and rhythm, no S3 or significant systolic murmur, no pericardial rub. Extremities: No pitting edema, distal pulses 2+. Skin: Warm and dry. Musculoskeletal: No kyphosis.   Neuropsychiatric: Alert and oriented x3, affect grossly appropriate.  ECG: 02/17/2020 sinus bradycardia with a rate of 58, left axis deviation, minimal voltage criteria for LVH  Recent Labwork: 08/19/2019: ALT 9; AST 21; BUN 10; Creatinine, Ser 0.64; Hemoglobin 14.4; Platelets 245; Potassium 4.7; Sodium 139; TSH 0.783     Component Value Date/Time   CHOL 176 07/10/2018 1135   TRIG 113 07/10/2018 1135   HDL 51 07/10/2018 1135   CHOLHDL 3.5 07/10/2018 1135   LDLCALC 102 (H) 07/10/2018 1135    Other Studies Reviewed Today:  Echo 04/2010 [facility] LVEF >12%, grade I diastolic dysfunction, mild MR  04/2010 [facility] No ischemia. Exercised 8 minutes, 9 METs, 95% THR.  12/2007 Renal artery [place] No stenosis, normal kidney size.   06/01/13 [facility] EKG NSR, LAD  05/2014 Exercise MPI IMPRESSION: 1. No reversible ischemia or infarction.  2. Normal left ventricular wall motion.  3. Left ventricular ejection fraction 57%  4. Low-risk stress test findings*.   08/2014 Echo Study Conclusions  - Left ventricle: The cavity size was normal. Wall thickness was normal. Systolic function was normal. The estimated ejection fraction was in the range of 60% to 65%. Wall motion was normal; there were no regional wall motion abnormalities. Doppler parameters are consistent with abnormal left ventricular relaxation (grade 1 diastolic dysfunction). -  Aortic valve: Mildly calcified annulus. Trileaflet. - Mitral valve: Mildly calcified annulus. Mildly calcified leaflets . There was mild regurgitation. - Tricuspid valve: There was mild regurgitation  Assessment and Plan:  1.  Chest wall pain Patient had a recent fall with injury to her left side and left arm.  She had recent chest x-ray and left forearm x-ray which were negative for fractures.  She did have evidence of old rib fractures on the left side.  She continues  to complain of chest pain aggravated by movement, deep breaths, deep palpation.  Please get a repeat chest x-ray.   1. Essential hypertension At last visit her blood pressure was elevated and lisinopril was increased to 40 mg daily.  Blood pressure continues to be elevated today.  Add hydralazine 25 mg p.o. twice daily.  2. Palpitations Denies any recent palpitations or arrhythmias.  EKG today shows sinus bradycardia rate of 58 LAD, minimal voltage criteria for LVH.  Medication Adjustments/Labs and Tests Ordered: Current medicines are reviewed at length with the patient today.  Concerns regarding medicines are outlined above.   Disposition: Follow-up with Dr. Wyline Mood or APP 3 months  Signed, Rennis Harding, NP 02/17/2020 1:53 PM    [facility] at [facility] [address] [place], [place], [place] [address] Phone: [phone]; Fax: [phone]

## 2020-02-17 ENCOUNTER — Ambulatory Visit (INDEPENDENT_AMBULATORY_CARE_PROVIDER_SITE_OTHER): Payer: Medicare HMO | Admitting: Family Medicine

## 2020-02-17 ENCOUNTER — Encounter: Payer: Self-pay | Admitting: Family Medicine

## 2020-02-17 ENCOUNTER — Ambulatory Visit (HOSPITAL_COMMUNITY)
Admission: RE | Admit: 2020-02-17 | Discharge: 2020-02-17 | Disposition: A | Discharge status: Home / Self Care | Payer: Medicare HMO | Source: Ambulatory Visit | Attending: Family Medicine | Admitting: Family Medicine

## 2020-02-17 ENCOUNTER — Other Ambulatory Visit: Payer: Self-pay

## 2020-02-17 VITALS — BP 150/90 | HR 60 | Ht 62.0 in | Wt 140.6 lb

## 2020-02-17 DIAGNOSIS — R002 Palpitations: Secondary | ICD-10-CM | POA: Diagnosis not present

## 2020-02-17 DIAGNOSIS — J90 Pleural effusion, not elsewhere classified: Secondary | ICD-10-CM | POA: Diagnosis not present

## 2020-02-17 DIAGNOSIS — R0602 Shortness of breath: Secondary | ICD-10-CM | POA: Diagnosis not present

## 2020-02-17 DIAGNOSIS — I10 Essential (primary) hypertension: Secondary | ICD-10-CM

## 2020-02-17 DIAGNOSIS — R079 Chest pain, unspecified: Secondary | ICD-10-CM | POA: Diagnosis not present

## 2020-02-17 MED ORDER — HYDRALAZINE HCL 25 MG PO TABS: 25.0000 mg | ORAL_TABLET | Freq: Two times a day (BID) | ORAL | 1 refills | Status: DC

## 2020-02-17 NOTE — Patient Instructions (Addendum)
Medication Instructions:   Your physician has recommended you make the following change in your medication:   Start hydralazine 25 mg by mouth twice daily  Continue other medications the same  Labwork:  None  Testing/Procedures:  A chest x-ray takes a picture of the organs and structures inside the chest, including the heart, lungs, and blood vessels. This test can show several things, including, whether the heart is enlarges; whether fluid is building up in the lungs; and whether pacemaker / defibrillator leads are still in place  Follow-Up:  Your physician recommends that you schedule a follow-up appointment in: 3 months.   Any Other Special Instructions Will Be Listed Below (If Applicable).  If you need a refill on your cardiac medications before your next appointment, please call your pharmacy.

## 2020-02-21 ENCOUNTER — Telehealth: Payer: Self-pay

## 2020-02-21 NOTE — Telephone Encounter (Signed)
[employer] approved from 02/21/20 through 02/24/2021 per insurance representative. [place] notified

## 2020-02-21 NOTE — Progress Notes (Signed)
Letter mailed/ LVM with daughter

## 2020-02-24 ENCOUNTER — Telehealth: Payer: Self-pay | Admitting: Family Medicine

## 2020-02-24 NOTE — Telephone Encounter (Signed)
Patient's Emergency contact Kaitlyn Serrano is requesting results of chest xray

## 2020-02-24 NOTE — Telephone Encounter (Signed)
Spoke with pt. Results given for chest x-ray.

## 2020-03-15 ENCOUNTER — Other Ambulatory Visit: Payer: Self-pay | Admitting: Family Medicine

## 2020-03-15 DIAGNOSIS — F411 Generalized anxiety disorder: Secondary | ICD-10-CM

## 2020-03-27 ENCOUNTER — Ambulatory Visit (INDEPENDENT_AMBULATORY_CARE_PROVIDER_SITE_OTHER): Payer: Medicare HMO | Admitting: Nurse Practitioner

## 2020-03-27 ENCOUNTER — Encounter: Payer: Self-pay | Admitting: Nurse Practitioner

## 2020-03-27 DIAGNOSIS — G894 Chronic pain syndrome: Secondary | ICD-10-CM | POA: Diagnosis not present

## 2020-03-27 DIAGNOSIS — F331 Major depressive disorder, recurrent, moderate: Secondary | ICD-10-CM | POA: Diagnosis not present

## 2020-03-27 DIAGNOSIS — Z79899 Other long term (current) drug therapy: Secondary | ICD-10-CM | POA: Diagnosis not present

## 2020-03-27 DIAGNOSIS — Z79891 Long term (current) use of opiate analgesic: Secondary | ICD-10-CM | POA: Diagnosis not present

## 2020-03-27 DIAGNOSIS — R0981 Nasal congestion: Secondary | ICD-10-CM | POA: Insufficient documentation

## 2020-03-27 DIAGNOSIS — M5106 Intervertebral disc disorders with myelopathy, lumbar region: Secondary | ICD-10-CM | POA: Diagnosis not present

## 2020-03-27 DIAGNOSIS — R69 Illness, unspecified: Secondary | ICD-10-CM | POA: Diagnosis not present

## 2020-03-27 DIAGNOSIS — M791 Myalgia, unspecified site: Secondary | ICD-10-CM | POA: Diagnosis not present

## 2020-03-27 MED ORDER — PREDNISONE 10 MG (21) PO TBPK: ORAL_TABLET | ORAL | 1 refills | Status: DC

## 2020-03-27 MED ORDER — AZITHROMYCIN 250 MG PO TABS: ORAL_TABLET | ORAL | 0 refills | Status: DC

## 2020-03-27 NOTE — Progress Notes (Signed)
   Virtual Visit via telephone Note Due to COVID-19 pandemic this visit was conducted virtually. This visit type was conducted due to national recommendations for restrictions regarding the COVID-19 Pandemic (e.g. social distancing, sheltering in place) in an effort to limit this patient's exposure and mitigate transmission in our community. All issues noted in this document were discussed and addressed.  A physical exam was not performed with this format.  I connected with Kaitlyn Serrano on 03/27/20 at  1:28 PM by telephone and verified that I am speaking with the correct person using two identifiers. Kaitlyn Serrano is currently located at at home during visit. The provider, Ivy Lynn, NP is located in their office at time of visit.  I discussed the limitations, risks, security and privacy concerns of performing an evaluation and management service by telephone and the availability of in person appointments. I also discussed with the patient that there may be a patient responsible charge related to this service. The patient expressed understanding and agreed to proceed.   History and Present Illness:  Sinusitis This is a recurrent problem. The current episode started in the past 7 days. The problem is unchanged. There has been no fever. Associated symptoms include chills, congestion and sinus pressure. Past treatments include nothing.      Review of Systems  Constitutional: Positive for chills. Negative for fever.  HENT: Positive for congestion and sinus pressure.   Cardiovascular: Positive for chest pain.  Genitourinary: Negative.   Musculoskeletal: Negative.   All other systems reviewed and are negative.    Observations/Objective:  Televisit.  Patient does not sound to be in distress.  Assessment and Plan:  Sinus congestion Patient is reporting sinus congestion with postnasal drainage, chest tightness, and chills.  Denies fever, nausea and chest congestion.  COVID-19  swab ordered results pending, started patient on Z-Pak and prednisone taper.   Education provided to to increase hydration, monitor oxygen saturation, fever and worsening signs. Patient verbalized understanding Rx sent to pharmacy.  Follow Up Instructions:  Follow-up with worsening or unresolved symptoms   I discussed the assessment and treatment plan with the patient. The patient was provided an opportunity to ask questions and all were answered. The patient agreed with the plan and demonstrated an understanding of the instructions.   The patient was advised to call back or seek an in-person evaluation if the symptoms worsen or if the condition fails to improve as anticipated.  The above assessment and management plan was discussed with the patient. The patient verbalized understanding of and has agreed to the management plan. Patient is aware to call the clinic if symptoms persist or worsen. Patient is aware when to return to the clinic for a follow-up visit. Patient educated on when it is appropriate to go to the emergency department.   Time call ended: 1:36 PM  I provided 8 minutes of non-face-to-face time during this encounter.    Ivy Lynn, NP

## 2020-03-27 NOTE — Patient Instructions (Signed)
Sinusitis, Adult Sinusitis is inflammation of your sinuses. Sinuses are hollow spaces in the bones around your face. Your sinuses are located:  Around your eyes.  In the middle of your forehead.  Behind your nose.  In your cheekbones. Mucus normally drains out of your sinuses. When your nasal tissues become inflamed or swollen, mucus can become trapped or blocked. This allows bacteria, viruses, and fungi to grow, which leads to infection. Most infections of the sinuses are caused by a virus. Sinusitis can develop quickly. It can last for up to 4 weeks (acute) or for more than 12 weeks (chronic). Sinusitis often develops after a cold. What are the causes? This condition is caused by anything that creates swelling in the sinuses or stops mucus from draining. This includes:  Allergies.  Asthma.  Infection from bacteria or viruses.  Deformities or blockages in your nose or sinuses.  Abnormal growths in the nose (nasal polyps).  Pollutants, such as chemicals or irritants in the air.  Infection from fungi (rare). What increases the risk? You are more likely to develop this condition if you:  Have a weak body defense system (immune system).  Do a lot of swimming or diving.  Overuse nasal sprays.  Smoke. What are the signs or symptoms? The main symptoms of this condition are pain and a feeling of pressure around the affected sinuses. Other symptoms include:  Stuffy nose or congestion.  Thick drainage from your nose.  Swelling and warmth over the affected sinuses.  Headache.  Upper toothache.  A cough that may get worse at night.  Extra mucus that collects in the throat or the back of the nose (postnasal drip).  Decreased sense of smell and taste.  Fatigue.  A fever.  Sore throat.  Bad breath. How is this diagnosed? This condition is diagnosed based on:  Your symptoms.  Your medical history.  A physical exam.  Tests to find out if your condition is  acute or chronic. This may include: ? Checking your nose for nasal polyps. ? Viewing your sinuses using a device that has a light (endoscope). ? Testing for allergies or bacteria. ? Imaging tests, such as an MRI or CT scan. In rare cases, a bone biopsy may be done to rule out more serious types of fungal sinus disease. How is this treated? Treatment for sinusitis depends on the cause and whether your condition is chronic or acute.  If caused by a virus, your symptoms should go away on their own within 10 days. You may be given medicines to relieve symptoms. They include: ? Medicines that shrink swollen nasal passages (topical intranasal decongestants). ? Medicines that treat allergies (antihistamines). ? A spray that eases inflammation of the nostrils (topical intranasal corticosteroids). ? Rinses that help get rid of thick mucus in your nose (nasal saline washes).  If caused by bacteria, your health care provider may recommend waiting to see if your symptoms improve. Most bacterial infections will get better without antibiotic medicine. You may be given antibiotics if you have: ? A severe infection. ? A weak immune system.  If caused by narrow nasal passages or nasal polyps, you may need to have surgery. Follow these instructions at home: Medicines  Take, use, or apply over-the-counter and prescription medicines only as told by your health care provider. These may include nasal sprays.  If you were prescribed an antibiotic medicine, take it as told by your health care provider. Do not stop taking the antibiotic even if you start   to feel better. Hydrate and humidify  Drink enough fluid to keep your urine pale yellow. Staying hydrated will help to thin your mucus.  Use a cool mist humidifier to keep the humidity level in your home above 50%.  Inhale steam for 10-15 minutes, 3-4 times a day, or as told by your health care provider. You can do this in the bathroom while a hot shower is  running.  Limit your exposure to cool or dry air.   Rest  Rest as much as possible.  Sleep with your head raised (elevated).  Make sure you get enough sleep each night. General instructions  Apply a warm, moist washcloth to your face 3-4 times a day or as told by your health care provider. This will help with discomfort.  Wash your hands often with soap and water to reduce your exposure to germs. If soap and water are not available, use hand sanitizer.  Do not smoke. Avoid being around people who are smoking (secondhand smoke).  Keep all follow-up visits as told by your health care provider. This is important.   Contact a health care provider if:  You have a fever.  Your symptoms get worse.  Your symptoms do not improve within 10 days. Get help right away if:  You have a severe headache.  You have persistent vomiting.  You have severe pain or swelling around your face or eyes.  You have vision problems.  You develop confusion.  Your neck is stiff.  You have trouble breathing. Summary  Sinusitis is soreness and inflammation of your sinuses. Sinuses are hollow spaces in the bones around your face.  This condition is caused by nasal tissues that become inflamed or swollen. The swelling traps or blocks the flow of mucus. This allows bacteria, viruses, and fungi to grow, which leads to infection.  If you were prescribed an antibiotic medicine, take it as told by your health care provider. Do not stop taking the antibiotic even if you start to feel better.  Keep all follow-up visits as told by your health care provider. This is important. This information is not intended to replace advice given to you by your health care provider. Make sure you discuss any questions you have with your health care provider. Document Revised: 07/14/2017 Document Reviewed: 07/14/2017 Elsevier Patient Education  2021 [employer]  

## 2020-03-27 NOTE — Assessment & Plan Note (Addendum)
Patient is reporting sinus congestion with postnasal drainage, chest tightness, and chills.  Denies fever, nausea and chest congestion.  COVID-19 swab ordered results pending, started patient on Z-Pak and prednisone taper.   Education provided to to increase hydration, monitor oxygen saturation, fever and worsening signs. Patient verbalized understanding Rx sent to pharmacy.

## 2020-03-28 LAB — NOVEL CORONAVIRUS, NAA: SARS-CoV-2, NAA: NOT DETECTED

## 2020-03-28 LAB — SARS-COV-2, NAA 2 DAY TAT

## 2020-04-07 ENCOUNTER — Other Ambulatory Visit: Payer: Self-pay | Admitting: Family

## 2020-04-07 ENCOUNTER — Other Ambulatory Visit: Payer: Self-pay | Admitting: Family Medicine

## 2020-04-07 DIAGNOSIS — F5104 Psychophysiologic insomnia: Secondary | ICD-10-CM

## 2020-04-07 DIAGNOSIS — F411 Generalized anxiety disorder: Secondary | ICD-10-CM

## 2020-04-18 ENCOUNTER — Ambulatory Visit (INDEPENDENT_AMBULATORY_CARE_PROVIDER_SITE_OTHER): Payer: Medicare Other | Admitting: Family Medicine

## 2020-04-18 ENCOUNTER — Encounter: Payer: Self-pay | Admitting: Family Medicine

## 2020-04-18 ENCOUNTER — Other Ambulatory Visit: Payer: Self-pay

## 2020-04-18 VITALS — BP 129/82 | HR 70 | Temp 97.7°F | Ht 62.0 in

## 2020-04-18 DIAGNOSIS — R350 Frequency of micturition: Secondary | ICD-10-CM

## 2020-04-18 DIAGNOSIS — F5104 Psychophysiologic insomnia: Secondary | ICD-10-CM

## 2020-04-18 DIAGNOSIS — F411 Generalized anxiety disorder: Secondary | ICD-10-CM

## 2020-04-18 LAB — URINALYSIS, COMPLETE
Bilirubin, UA: NEGATIVE
Glucose, UA: NEGATIVE
Ketones, UA: NEGATIVE
Leukocytes,UA: NEGATIVE
Nitrite, UA: NEGATIVE
Protein,UA: NEGATIVE
RBC, UA: NEGATIVE
Specific Gravity, UA: 1.025 (ref 1.005–1.030)
Urobilinogen, Ur: 1 mg/dL (ref 0.2–1.0)
pH, UA: 5.5 (ref 5.0–7.5)

## 2020-04-18 LAB — MICROSCOPIC EXAMINATION: RBC, Urine: NONE SEEN /hpf (ref 0–2)

## 2020-04-18 MED ORDER — DULOXETINE HCL 30 MG PO CPEP: 30.0000 mg | ORAL_CAPSULE | Freq: Every day | ORAL | 3 refills | Status: DC

## 2020-04-18 MED ORDER — TRAZODONE HCL 50 MG PO TABS: ORAL_TABLET | ORAL | 3 refills | Status: DC

## 2020-04-18 MED ORDER — NITROFURANTOIN MONOHYD MACRO 100 MG PO CAPS: 100.0000 mg | ORAL_CAPSULE | Freq: Two times a day (BID) | ORAL | 0 refills | Status: AC

## 2020-04-18 MED ORDER — BUSPIRONE HCL 5 MG PO TABS: ORAL_TABLET | ORAL | 0 refills | Status: AC

## 2020-04-18 MED ORDER — FLUCONAZOLE 150 MG PO TABS: 150.0000 mg | ORAL_TABLET | Freq: Once | ORAL | 0 refills | Status: AC

## 2020-04-18 MED ORDER — ATENOLOL 25 MG PO TABS
25.0000 mg | ORAL_TABLET | Freq: Every day | ORAL | 3 refills | Status: DC
Start: 2020-04-18 — End: 2020-10-02

## 2020-04-18 NOTE — Patient Instructions (Signed)
Taper off the Buspirone.  New prescription sent with new directions to [facility]  We talked about the danger of Trazodone and Duloxetine.  I've given you a handout on this today.   Serotonin Syndrome Serotonin is a chemical in your body (neurotransmitter) that helps to control several functions, such as:  Brain and nerve cell function.  Mood and emotions.  Memory.  Eating.  Sleeping.  Sexual activity.  Stress response. Having too much serotonin in your body can cause serotonin syndrome. This condition can be harmful to your brain and nerve cells. This can be a life-threatening condition. What are the causes? This condition may be caused by taking medicines or drugs that increase the level of serotonin in your body, such as:  Antidepressant medicines.  Migraine medicines.  Certain pain medicines.  Certain drugs, including ecstasy, LSD, cocaine, and amphetamines.  Over-the-counter cough or cold medicines that contain dextromethorphan.  Certain herbal supplements, including St. John's wort, ginseng, and nutmeg. This condition usually occurs when you take these medicines or drugs in combination, but it can also happen with a high dose of a single medicine or drug. What increases the risk? You are more likely to develop this condition if:  You just started taking a medicine or drug that increases the level of serotonin in the body.  You recently increased the dose of a medicine or drug that increases the level of serotonin in the body.  You take more than one medicine or drug that increases the level of serotonin in the body. What are the signs or symptoms? Symptoms of this condition usually start within several hours of taking a medicine or drug. Symptoms may be mild or severe. Mild symptoms include:  Sweating.  Restlessness or agitation.  Muscle twitching or stiffness.  Rapid heart rate.  Nausea and vomiting.  Diarrhea.  Headache.  Shivering or goose  bumps.  Confusion. Severe symptoms include:  Irregular heartbeat.  Seizures.  Loss of consciousness.  High fever. How is this diagnosed? This condition may be diagnosed based on:  Your medical history.  A physical exam.  Your prior use of drugs and medicines.  Blood or urine tests. These may be used to rule out other causes of your symptoms. How is this treated? The treatment for this condition depends on the severity of your symptoms.  For mild cases, stopping the medicine or drug that caused your condition is usually all that is needed.  For moderate to severe cases, treatment in a hospital may be needed to prevent or manage life-threatening symptoms. This may include medicines to control your symptoms, IV fluids, interventions to support your breathing, and treatments to control your body temperature. Follow these instructions at home: Medicines  Take over-the-counter and prescription medicines only as told by your health care provider. This is important.  Check with your health care provider before you start taking any new prescriptions, over-the-counter medicines, herbs, or supplements.  Avoid combining any medicines that can cause this condition to occur.   Lifestyle  Maintain a healthy lifestyle. ? Eat a healthy diet that includes plenty of vegetables, fruits, whole grains, low-fat dairy products, and lean protein. Do not eat a lot of foods that are high in fat, added sugars, or salt. ? Get the right amount and quality of sleep. Most adults need 7-9 hours of sleep each night. ? Make time to exercise, even if it is only for short periods of time. Most adults should exercise for at least 150 minutes each week. ?  Do not drink alcohol. ? Do not use illegal drugs, and do not take medicines for reasons other than they are prescribed.   General instructions  Do not use any products that contain nicotine or tobacco, such as cigarettes and e-cigarettes. If you need help  quitting, ask your health care provider.  Keep all follow-up visits as told by your health care provider. This is important. Contact a health care provider if:  Your symptoms do not improve or they get worse. Get help right away if you:  Have worsening confusion, severe headache, chest pain, high fever, seizures, or loss of consciousness.  Experience serious side effects of medicine, such as swelling of your face, lips, tongue, or throat.  Have serious thoughts about hurting yourself or others. These symptoms may represent a serious problem that is an emergency. Do not wait to see if the symptoms will go away. Get medical help right away. Call your local emergency services (911 in the [place] Do not drive yourself to the hospital. If you ever feel like you may hurt yourself or others, or have thoughts about taking your own life, get help right away. You can go to your nearest emergency department or call:  Your local emergency services (911 in the [place]  A suicide crisis helpline, such as the [facility] at 6044939061. This is open 24 hours a day. Summary  Serotonin is a brain chemical that helps to regulate the nervous system. High levels of serotonin in the body can cause serotonin syndrome, which is a very dangerous condition.  This condition may be caused by taking medicines or drugs that increase the level of serotonin in your body.  Treatment depends on the severity of your symptoms. For mild cases, stopping the medicine or drug that caused your condition is usually all that is needed.  Check with your health care provider before you start taking any new prescriptions, over-the-counter medicines, herbs, or supplements. This information is not intended to replace advice given to you by your health care provider. Make sure you discuss any questions you have with your health care provider. Document Revised: 03/21/2017 Document Reviewed:  03/21/2017 Elsevier Patient Education  2021 [employer].

## 2020-04-18 NOTE — Progress Notes (Signed)
Subjective: CC:GAD/ insomnia PCP: Janora Norlander, DO [id number] T Kaitlyn Serrano is a 81 y.o. female presenting to clinic today for:  1. GAD/ Insomnia Patient with ongoing difficulty with sleep.  She takes 75 mg of trazodone daily is on 30 mg of Cymbalta as well.  She does not feel that she really needs the buspirone anymore would like to start tapering down from that.  2.  Increased urinary frequency Patient reports increased urinary frequency, vaginal irritation and itching.  This is been ongoing for the last couple of weeks.  No hematuria, fevers or nausea   ROS: Per HPI  Allergies  Allergen Reactions  . Bee Venom Anaphylaxis  . Hydrocodone Swelling  . Penicillins Anaphylaxis and Rash    Has patient had a PCN reaction causing immediate rash, facial/tongue/throat swelling, SOB or lightheadedness with hypotension: Yes Has patient had a PCN reaction causing severe rash involving mucus membranes or skin necrosis: Yes Has patient had a PCN reaction that required hospitalization: Yes Has patient had a PCN reaction occurring within the last 10 years: No If all of the above answers are "NO", then may proceed with Cephalosporin use.   . Amlodipine Swelling  . Elavil [Amitriptyline Hcl] Other (See Comments)    Confusion, hallucinations  . Statins Swelling and Other (See Comments)    Peeling of skin   Past Medical History:  Diagnosis Date  . 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 Chronic pain syndrome   . Complication of anesthesia   . Diverticulosis   . 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 family unaware  . Dysphagia   . Dyspnea   . Generalized weakness   . GERD (gastroesophageal reflux disease)   . GERD (gastroesophageal reflux disease)   . High cholesterol   . Hoarseness   . Hx of cardiovascular stress test    negative 2012 and 2016  . Hypertension   . Insomnia   . Intervertebral disc disorder with myelopathy, lumbar region   . Lumbago   . Lumbar spine pain   . PONV  (postoperative nausea and vomiting)    confusion after anesthesia  . Renal insufficiency   . Ringing in ears    resolved  . Spondylosis with myelopathy, lumbar region    legs, back    Current Outpatient Medications:  .  acetaminophen (TYLENOL) 500 MG tablet, Take 2 tablets (1,000 mg total) by mouth every 8 (eight) hours., Disp: 30 tablet, Rfl: 0 .  atenolol (TENORMIN) 25 MG tablet, TAKE 1 TABLET DAILY, Disp: 90 tablet, Rfl: 0 .  busPIRone (BUSPAR) 10 MG tablet, TAKE  (1)  TABLET  THREE TIMES DAILY., Disp: 90 tablet, Rfl: 0 .  diclofenac (VOLTAREN) 75 MG EC tablet, Take 1 tablet (75 mg total) by mouth 2 (two) times daily. For muscle and  Joint pain, Disp: 60 tablet, Rfl: 2 .  dicyclomine (BENTYL) 10 MG capsule, TAKE  (1)  CAPSULE  TWICE DAILY (TAKE ON AN EMPTY STOMACH AT LEAST 30 MIN- UTES BEFORE MEALS)., Disp: 60 capsule, Rfl: 0 .  diltiazem (TIAZAC) 120 MG 24 hr capsule, Take 1 capsule (120 mg total) by mouth daily., Disp: 90 capsule, Rfl: 1 .  docusate sodium (COLACE) 100 MG capsule, Take 1 capsule (100 mg total) by mouth 2 (two) times daily., Disp: 28 capsule, Rfl: 0 .  DULoxetine (CYMBALTA) 30 MG capsule, TAKE (1) CAPSULE DAILY, Disp: 90 capsule, Rfl: 0 .  EPINEPHrine 0.3 mg/0.3 mL IJ SOAJ injection, Inject 0.3 mLs (0.3 mg total) into the  muscle once. (Patient taking differently: Inject 0.3 mg into the muscle as needed for anaphylaxis.), Disp: 2 Device, Rfl: 1 .  Estradiol Acetate (FEMRING) 0.05 MG/24HR RING, INSERT VAGINALLY AS DIRECTED EVERY 3 MONTHS, Disp: 1 each, Rfl: 0 .  fluticasone (FLONASE) 50 MCG/ACT nasal spray, Place 2 sprays into both nostrils daily as needed for allergies., Disp: 16 g, Rfl: 12 .  hydrALAZINE (APRESOLINE) 25 MG tablet, Take 1 tablet (25 mg total) by mouth in the morning and at bedtime., Disp: 180 tablet, Rfl: 1 .  lisinopril (ZESTRIL) 40 MG tablet, Take 1 tablet (40 mg total) by mouth daily., Disp: 90 tablet, Rfl: 3 .  loratadine (CLARITIN) 10 MG tablet, Take 1  tablet (10 mg total) by mouth daily., Disp: 30 tablet, Rfl: 11 .  oxyCODONE (OXY IR/ROXICODONE) 5 MG immediate release tablet, Take 1-2 tablets (5-10 mg total) by mouth every 4 (four) hours as needed for moderate pain or severe pain., Disp: 60 tablet, Rfl: 0 .  polyethylene glycol (MIRALAX / GLYCOLAX) 17 g packet, Take 17 g by mouth 2 (two) times daily., Disp: 28 packet, Rfl: 0 .  tiZANidine (ZANAFLEX) 2 MG tablet, Take 1 mg by mouth in the morning, at noon, and at bedtime., Disp: , Rfl:  .  traZODone (DESYREL) 50 MG tablet, TAKE 1 AND 1/2 TABLETS BY MOUTH AT BEDTIME AS NEEDED FOR SLEEP., Disp: 45 tablet, Rfl: 0 Social History   Socioeconomic History  . Marital status: Divorced    Spouse name: Not on file  . Number of children: 2  . Years of education: Not on file  . Highest education level: Not on file  Occupational History  . Not on file  Tobacco Use  . Smoking status: Former Smoker    Packs/day: 4.00    Years: 3.00    Pack years: 12.00    Types: Cigarettes    Start date: 06/10/1960    Quit date: 02/20/1964    Years since quitting: 56.1  . Smokeless tobacco: Never Used  Vaping Use  . Vaping Use: Never used  Substance and Sexual Activity  . Alcohol use: No    Alcohol/week: 0.0 standard drinks  . Drug use: No  . Sexual activity: Not Currently    Birth control/protection: None  Other Topics Concern  . Not on file  Social History Narrative   ** Merged History Encounter **      [name] lives alone. She is separated/divorced from her second husband and widowed by her first husband. She has twin daughters, [name] and [name]. [name] has been a cause of stress for her and she believes was preventing her from getting medical care that she needed. She is no longer talking to [facility]. She states that [name] is involved in her care and that she trusts her to have her best interest in mind.        Social Determinants of Health   Financial Resource Strain: Not on file  Food  Insecurity: Not on file  Transportation Needs: Not on file  Physical Activity: Not on file  Stress: Not on file  Social Connections: Not on file  Intimate Partner Violence: Not on file   Family History  Problem Relation Age of Onset  . Diabetes Mother   . Heart disease Mother   . Hypertension Mother   . Gallbladder disease Mother   . Heart disease Father   . Pneumonia Sister   . Hypertension Brother   . Hyperlipidemia Brother   . Gallbladder disease  Other   . Hypertension Child     Objective: Office vital signs reviewed. BP 129/82   Pulse 70   Temp 97.7 F (36.5 C) (Temporal)   Ht 5' 2" (1.575 m)   SpO2 97%   BMI 25.72 kg/m   Physical Examination:  General: Awake, alert, well nourished, No acute distress HEENT: Normal, sclera white, MMM Cardio: regular rate and rhythm, S1S2 heard, no murmurs appreciated Pulm: clear to auscultation bilaterally, no wheezes, rhonchi or rales; normal work of breathing on room air Psych: Mood stable, speech normal, affect appropriate  Depression screen [facility] 2/9 04/18/2020 12/17/2019 08/19/2019  Decreased Interest 0 0 0  Down, Depressed, Hopeless 1 0 0  PHQ - 2 Score 1 0 0  Altered sleeping 2 0 -  Tired, decreased energy 2 0 -  Change in appetite 0 0 -  Feeling bad or failure about yourself  0 0 -  Trouble concentrating 1 0 -  Moving slowly or fidgety/restless 0 0 -  Suicidal thoughts 0 0 -  PHQ-9 Score 6 0 -  Difficult doing work/chores Not difficult at all - -  Some recent data might be hidden   GAD 7 : Generalized Anxiety Score 04/18/2020 12/17/2019  Nervous, Anxious, on Edge 0 0  Control/stop worrying 0 0  Worry too much - different things 0 0  Trouble relaxing 0 0  Restless 0 0  Easily annoyed or irritable 0 0  Afraid - awful might happen 0 0  Total GAD 7 Score 0 0      Assessment/ Plan: 80 y.o. female   GAD (generalized anxiety disorder) - Plan: busPIRone (BUSPAR) 5 MG tablet, DULoxetine (CYMBALTA) 30 MG capsule,  CMP14+EGFR  Psychophysiological insomnia - Plan: traZODone (DESYREL) 50 MG tablet, CMP14+EGFR  Urinary frequency - Plan: Urinalysis, Complete, nitrofurantoin, macrocrystal-monohydrate, (MACROBID) 100 MG capsule, fluconazole (DIFLUCAN) 150 MG tablet  Anxiety disorder is stable.  Okay to start tapering from BuSpar she does not find it to be helpful.  Continue Cymbalta.  Check CMP  Advised that given use of Cymbalta, I would hesitate to continue advancing trazodone if not absolutely needed.  We could consider the utility of Cymbalta going forward perhaps taper off of this and also higher dose of trazodone if needed.  Her urinalysis did show leukocytes and few bacteria.  We will proceed with Keflex and Diflucan.  Follow-up as needed on this issue  No orders of the defined types were placed in this encounter.  No orders of the defined types were placed in this encounter.    Janora Norlander, DO [facility] [phone]

## 2020-04-19 LAB — CMP14+EGFR
ALT: 15 IU/L (ref 0–32)
AST: 16 IU/L (ref 0–40)
Albumin/Globulin Ratio: 1.7 (ref 1.2–2.2)
Albumin: 3.9 g/dL (ref 3.7–4.7)
Alkaline Phosphatase: 117 IU/L (ref 44–121)
BUN/Creatinine Ratio: 24 (ref 12–28)
BUN: 16 mg/dL (ref 8–27)
Bilirubin Total: 0.5 mg/dL (ref 0.0–1.2)
CO2: 25 mmol/L (ref 20–29)
Calcium: 9.2 mg/dL (ref 8.7–10.3)
Chloride: 100 mmol/L (ref 96–106)
Creatinine, Ser: 0.67 mg/dL (ref 0.57–1.00)
GFR calc Af Amer: 97 mL/min/{1.73_m2} (ref >59)
GFR calc non Af Amer: 84 mL/min/{1.73_m2} (ref >59)
Globulin, Total: 2.3 g/dL (ref 1.5–4.5)
Glucose: 131 mg/dL — ABNORMAL HIGH (ref 65–99)
Potassium: 4.6 mmol/L (ref 3.5–5.2)
Sodium: 139 mmol/L (ref 134–144)
Total Protein: 6.2 g/dL (ref 6.0–8.5)

## 2020-04-20 ENCOUNTER — Other Ambulatory Visit: Payer: Self-pay | Admitting: *Deleted

## 2020-04-20 DIAGNOSIS — I10 Essential (primary) hypertension: Secondary | ICD-10-CM

## 2020-04-20 MED ORDER — LORATADINE 10 MG PO TABS: 10.0000 mg | ORAL_TABLET | Freq: Every day | ORAL | 11 refills | Status: DC

## 2020-04-20 MED ORDER — FEMRING 0.05 MG/24HR VA RING: VAGINAL_RING | VAGINAL | 0 refills | Status: DC

## 2020-05-01 ENCOUNTER — Other Ambulatory Visit: Payer: Self-pay | Admitting: Family

## 2020-05-22 ENCOUNTER — Telehealth: Payer: Self-pay

## 2020-05-22 NOTE — Telephone Encounter (Signed)
Pharmacy updated.

## 2020-05-22 NOTE — Progress Notes (Deleted)
Cardiology Office Note  Date: 05/22/2020   ID: Atia, Haupt 05/08/1940, MRN [id number]  PCP:  Janora Norlander, DO  Cardiologist:  Carlyle Dolly, MD Electrophysiologist:  None   Chief Complaint: Follow-up palpitations  History of Present Illness: Kaitlyn Serrano is a 80 y.o. female with a history of palpitations, hypertension, CAD, chest pain, CHF, diabetes, dyspnea, chronic pain syndrome, hyperlipidemia, IBS, insomnia. Recent right total hip arthroplasty Dr Alvan Dame.  Last saw Dr. Harl Bowie 12/11/2018 via telemedicine for palpitations and hypertension.  Had a recent ER visit on 10/15/2018 with elevated blood pressures in the 180s associated chest pain.  Chest pain resolved with blood pressure control.  Her lisinopril was increased to 20 mg daily.  Her blood pressures were running 130s over 80 when taking the lisinopril.  She did experience some palpitations in the past 2017.  Had CT with which was negative for PE.  Negative evaluation for ACS.  She was started on diltiazem by another provider and remained on atenolol..  Last here October 18, 2019 for follow-up.  She denied any recent acute illnesses or hospitalizations.  Stated she had not been here in a while because she had a recent right total hip arthroplasty.  Stated she had recently been having issues with increased blood pressures.  Blood pressure on arrival was 160/78.  She stated these were the type of readings she had recently at home.  She denied any recent progressive anginal or exertional symptoms, palpitations or arrhythmias, orthostatic symptoms, dyspeptic symptoms, or blood in stool or urine.  Denied any claudication-like symptoms, DVT or PE-like symptoms, or lower extremity edema.  Denied any PND or orthopnea.  EKG  sinus bradycardia with left axis deviation, minimal voltage criteria for LVH possible anterior lateral infarct age undetermined, heart rate of 55.  Stated she had been having some mid thoracic back pain  aggravated by movement.  Denied any relationship to exertion.  Denied any associated nausea, vomiting, or diaphoresis.  Stated she had  multiple back surgeries in the past.  Telephone encounter on 02/11/2020 with nursing staff.  She was complaining of chest pain.  She described shortness of breath and chest pain.  Stated had been occurring over the last few days.  It was continuous.  She had not taken any nitroglycerin.  Daughter stated she fell 2 weeks prior and chest pain would come and go but had gotten worse recently.  She is presenting today with same complaints.  She had a chest x-ray and a left forearm x-ray at [facility].  Both showed no new fractures.  She had some old rib fractures noted on chest x-ray.  She continues to complain of left axillary chest wall pain aggravated by taking deep breaths and movement.  Aggravated by deep palpation.  Denies any associated chest pressure, tightness, pain on exertion.  Denies any associated radiation to neck, arm, back or jaw.  Denies any associated nausea, vomiting, or diaphoresis.  Denies any CVA or TIA-like symptoms, palpitations or arrhythmias, orthostatic symptoms, CVA or TIA-like symptoms, bleeding, denies any claudication-like symptoms, DVT or PE-like symptoms.  EKG shows sinus bradycardia with left axis deviation rate of 58.  No acute ST or T wave abnormalities.  Blood pressure is elevated on arrival.  States has taken all of her blood pressure medications today.  Previous blood pressure on 01/28/2020 was 163/86.  Today's blood pressure 150/90.  Past Medical History:  Diagnosis Date  . 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 Chronic  pain syndrome   . Complication of anesthesia   . Diverticulosis   . 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 family unaware  . Dysphagia   . Dyspnea   . Generalized weakness   . GERD (gastroesophageal reflux disease)   . GERD (gastroesophageal reflux disease)   . High cholesterol   . Hoarseness    . Hx of cardiovascular stress test    negative 2012 and 2016  . Hypertension   . Insomnia   . Intervertebral disc disorder with myelopathy, lumbar region   . Lumbago   . Lumbar spine pain   . PONV (postoperative nausea and vomiting)    confusion after anesthesia  . Renal insufficiency   . Ringing in ears    resolved  . Spondylosis with myelopathy, lumbar region    legs, back    Past Surgical History:  Procedure Laterality Date  . ABDOMINAL HYSTERECTOMY    . BACK SURGERY    . BONE MARROW ASPIRATION    . CERVICAL SPINE SURGERY    . EYE SURGERY Bilateral    Lasic  . FIXATION KYPHOPLASTY    . LAMINOTOMY    . SPINAL FUSION    . 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   Procedure: TOTAL HIP ARTHROPLASTY ANTERIOR APPROACH;  Surgeon: Paralee Cancel, MD;  Location: [facility];  Service: Orthopedics;  Laterality: Right;  70 mins    Current Outpatient Medications  Medication Sig Dispense Refill  . acetaminophen (TYLENOL) 500 MG tablet Take 2 tablets (1,000 mg total) by mouth every 8 (eight) hours. 30 tablet 0  . atenolol (TENORMIN) 25 MG tablet Take 1 tablet (25 mg total) by mouth daily. 90 tablet 3  . busPIRone (BUSPAR) 5 MG tablet Take 1 tablet (5 mg total) by mouth 3 (three) times daily for 14 days, THEN 1 tablet (5 mg total) 2 (two) times daily for 14 days, THEN 1 tablet (5 mg total) daily for 14 days. Then stop. 84 tablet 0  . diclofenac (VOLTAREN) 75 MG EC tablet Take 1 tablet (75 mg total) by mouth 2 (two) times daily. For muscle and  Joint pain 60 tablet 2  . dicyclomine (BENTYL) 10 MG capsule TAKE  (1)  CAPSULE  TWICE DAILY (TAKE ON AN EMPTY STOMACH AT LEAST 30 MIN- UTES BEFORE MEALS). 60 capsule 0  . diltiazem (TIAZAC) 120 MG 24 hr capsule Take 1 capsule (120 mg total) by mouth daily. 90 capsule 1  . docusate sodium (COLACE) 100 MG capsule Take 1 capsule (100 mg total) by mouth 2 (two) times daily. 28 capsule 0  . DULoxetine (CYMBALTA) 30 MG capsule Take 1 capsule (30 mg total) by  mouth daily. Place on hold 90 capsule 3  . EPINEPHrine 0.3 mg/0.3 mL IJ SOAJ injection Inject 0.3 mLs (0.3 mg total) into the muscle once. (Patient taking differently: Inject 0.3 mg into the muscle as needed for anaphylaxis.) 2 Device 1  . Estradiol Acetate (FEMRING) 0.05 MG/24HR RING INSERT VAGINALLY AS DIRECTED EVERY 3 MONTHS 1 each 0  . fluticasone (FLONASE) 50 MCG/ACT nasal spray Place 2 sprays into both nostrils daily as needed for allergies. 16 g 12  . hydrALAZINE (APRESOLINE) 25 MG tablet Take 1 tablet (25 mg total) by mouth in the morning and at bedtime. 180 tablet 1  . lisinopril (ZESTRIL) 40 MG tablet Take 1 tablet (40 mg total) by mouth daily. 90 tablet 3  . loratadine (CLARITIN) 10 MG tablet Take 1 tablet (10 mg total) by mouth daily. 30 tablet  11  . oxyCODONE (OXY IR/ROXICODONE) 5 MG immediate release tablet Take 1-2 tablets (5-10 mg total) by mouth every 4 (four) hours as needed for moderate pain or severe pain. 60 tablet 0  . polyethylene glycol (MIRALAX / GLYCOLAX) 17 g packet Take 17 g by mouth 2 (two) times daily. 28 packet 0  . tiZANidine (ZANAFLEX) 2 MG tablet Take 1 mg by mouth in the morning, at noon, and at bedtime.    . traZODone (DESYREL) 50 MG tablet TAKE 1 AND 1/2 TABLETS BY MOUTH AT BEDTIME AS NEEDED FOR SLEEP. Place on hold 135 tablet 3   No current facility-administered medications for this visit.   Allergies:  Bee venom, Hydrocodone, Penicillins, Amlodipine, Elavil [amitriptyline hcl], and Statins   Social History: The patient  reports that she quit smoking about 56 years ago. Her smoking use included cigarettes. She started smoking about 59 years ago. She has a 12.00 pack-year smoking history. She has never used smokeless tobacco. She reports that she does not drink alcohol and does not use drugs.   Family History: The patient's family history includes Diabetes in her mother; Gallbladder disease in her mother and another family member; Heart disease in her father and  mother; Hyperlipidemia in her brother; Hypertension in her brother, child, and mother; Pneumonia in her sister.   ROS:  Please see the history of present illness. Otherwise, complete review of systems is positive for none.  All other systems are reviewed and negative.   Physical Exam: VS:  There were no vitals taken for this visit., BMI There is no height or weight on file to calculate BMI.  Wt Readings from Last 3 Encounters:  02/17/20 140 lb 9.6 oz (63.8 kg)  12/17/19 140 lb (63.5 kg)  10/18/19 135 lb (61.2 kg)    General: Patient appears comfortable at rest. Neck: Supple, no elevated JVP or carotid bruits, no thyromegaly. Lungs: Clear to auscultation, nonlabored breathing at rest. Cardiac: Regular rate and rhythm, no S3 or significant systolic murmur, no pericardial rub. Extremities: No pitting edema, distal pulses 2+. Skin: Warm and dry. Musculoskeletal: No kyphosis.   Neuropsychiatric: Alert and oriented x3, affect grossly appropriate.  ECG: 02/17/2020 sinus bradycardia with a rate of 58, left axis deviation, minimal voltage criteria for LVH  Recent Labwork: 08/19/2019: Hemoglobin 14.4; Platelets 245; TSH 0.783 04/18/2020: ALT 15; AST 16; BUN 16; Creatinine, Ser 0.67; Potassium 4.6; Sodium 139     Component Value Date/Time   CHOL 176 07/10/2018 1135   TRIG 113 07/10/2018 1135   HDL 51 07/10/2018 1135   CHOLHDL 3.5 07/10/2018 1135   LDLCALC 102 (H) 07/10/2018 1135    Other Studies Reviewed Today:  Echo 04/2010 [facility] LVEF >96%, grade I diastolic dysfunction, mild MR  04/2010 ExerciseMyoview No ischemia. Exercised 8 minutes, 9 METs, 95% THR.  12/2007 Renal artery [place] No stenosis, normal kidney size.   06/01/13 Clinic EKG NSR, LAD  05/2014 Exercise MPI IMPRESSION: 1. No reversible ischemia or infarction.  2. Normal left ventricular wall motion.  3. Left ventricular ejection fraction 57%  4. Low-risk stress test findings*.   08/2014 Echo Study  Conclusions  - Left ventricle: The cavity size was normal. Wall thickness was normal. Systolic function was normal. The estimated ejection fraction was in the range of 60% to 65%. Wall motion was normal; there were no regional wall motion abnormalities. Doppler parameters are consistent with abnormal left ventricular relaxation (grade 1 diastolic dysfunction). - Aortic valve: Mildly calcified annulus. Trileaflet. - Mitral  valve: Mildly calcified annulus. Mildly calcified leaflets . There was mild regurgitation. - Tricuspid valve: There was mild regurgitation  Assessment and Plan:  1.  Chest wall pain Patient had a recent fall with injury to her left side and left arm.  She had recent chest x-ray and left forearm x-ray which were negative for fractures.  She did have evidence of old rib fractures on the left side.  She continues to complain of chest pain aggravated by movement, deep breaths, deep palpation.  Please get a repeat chest x-ray.   1. Essential hypertension At last visit her blood pressure was elevated and lisinopril was increased to 40 mg daily.  Blood pressure continues to be elevated today.  Add hydralazine 25 mg p.o. twice daily.  2. Palpitations Denies any recent palpitations or arrhythmias.  EKG today shows sinus bradycardia rate of 58 LAD, minimal voltage criteria for LVH.  Medication Adjustments/Labs and Tests Ordered: Current medicines are reviewed at length with the patient today.  Concerns regarding medicines are outlined above.   Disposition: Follow-up with Dr. Harl Bowie or APP 3 months  Signed, Levell July, NP 05/22/2020 4:42 PM    [facility] [place], [place], [place] [address] Phone: [phone]; Fax: [phone]

## 2020-05-23 ENCOUNTER — Ambulatory Visit: Payer: Self-pay | Admitting: Family Medicine

## 2020-05-23 DIAGNOSIS — R002 Palpitations: Secondary | ICD-10-CM

## 2020-05-23 DIAGNOSIS — R0789 Other chest pain: Secondary | ICD-10-CM

## 2020-05-23 DIAGNOSIS — I10 Essential (primary) hypertension: Secondary | ICD-10-CM

## 2020-05-27 ENCOUNTER — Other Ambulatory Visit: Payer: Self-pay | Admitting: Family Medicine

## 2020-05-29 ENCOUNTER — Other Ambulatory Visit: Payer: Self-pay | Admitting: Family Medicine

## 2020-05-29 DIAGNOSIS — I10 Essential (primary) hypertension: Secondary | ICD-10-CM

## 2020-05-29 DIAGNOSIS — F411 Generalized anxiety disorder: Secondary | ICD-10-CM

## 2020-05-29 MED ORDER — DILTIAZEM HCL ER BEADS 120 MG PO CP24: 120.0000 mg | ORAL_CAPSULE | Freq: Every day | ORAL | 0 refills | Status: DC

## 2020-05-29 NOTE — Telephone Encounter (Signed)
Pt called in saying she needs all her meds. I told her that she needed to give name of meds but she kept saying she needed all her meds.

## 2020-05-29 NOTE — Addendum Note (Signed)
Addended by: Michaela Corner on: 05/29/2020 03:04 PM   Modules accepted: Orders

## 2020-06-02 ENCOUNTER — Ambulatory Visit: Payer: Self-pay | Admitting: Family Medicine

## 2020-06-05 ENCOUNTER — Telehealth: Payer: Self-pay

## 2020-06-05 ENCOUNTER — Other Ambulatory Visit: Payer: Self-pay | Admitting: Family Medicine

## 2020-06-05 DIAGNOSIS — I10 Essential (primary) hypertension: Secondary | ICD-10-CM

## 2020-06-05 NOTE — Telephone Encounter (Signed)
Daughter Kaitlyn Serrano calling-says that pt lost her bp medication. Can [facility] [place]? Daughter does not know the name of the bp rx.

## 2020-06-05 NOTE — Telephone Encounter (Signed)
Called pt  - found the meds

## 2020-06-07 DIAGNOSIS — Z96641 Presence of right artificial hip joint: Secondary | ICD-10-CM | POA: Diagnosis not present

## 2020-06-07 NOTE — Progress Notes (Signed)
Cardiology Office Note  Date: 06/08/2020   ID: Kaitlyn Serrano, DOB Dec 18, 1940, MRN [id number]  PCP:  Kaitlyn Norlander, DO  Cardiologist:  Kaitlyn Dolly, MD Electrophysiologist:  None   Chief Complaint: Cardiac follow-up  History of Present Illness: Kaitlyn Serrano is a 80 y.o. female with a history of palpitations, hypertension, CAD, chest pain, CHF, diabetes, dyspnea, chronic pain syndrome, hyperlipidemia, IBS, insomnia. Recent right total hip arthroplasty Kaitlyn Serrano.  Last saw Kaitlyn Serrano 12/11/2018 via telemedicine for palpitations and hypertension.  Had a recent ER visit on 10/15/2018 with elevated blood pressures in the 180s associated chest pain.  Chest pain resolved with blood pressure control.  Her lisinopril was increased to 20 mg daily.  Her blood pressures were running 130s over 80 when taking the lisinopril.  She did experience some palpitations in the past 2017.  Had CT with which was negative for PE.  Negative evaluation for ACS.  She was started on diltiazem by another provider and remained on atenolol..  October 18, 2019 for follow-up.  She denied any recent acute illnesses or hospitalizations.  Stated she had not been here in a while because she had a recent right total hip arthroplasty.  Stated she had recently been having issues with increased blood pressures.  Blood pressure on arrival was 160/78.  She stated these were the type of readings she had recently at home.  She denied any recent progressive anginal or exertional symptoms, palpitations or arrhythmias, orthostatic symptoms, dyspeptic symptoms, or blood in stool or urine.  Denied any claudication-like symptoms, DVT or PE-like symptoms, or lower extremity edema.  Denied any PND or orthopnea.  EKG  sinus bradycardia with left axis deviation, minimal voltage criteria for LVH possible anterior lateral infarct age undetermined, heart rate of 55.  Stated she had been having some mid thoracic back pain aggravated by movement.   Denied any relationship to exertion.  Denied any associated nausea, vomiting, or diaphoresis.  Stated she had  multiple back surgeries in the past.  She is here for 28-month follow-up.  She denies any anginal symptoms.  States she does have some mild shortness of breath at times.  States she has not been very active.  At last visit her blood pressure was elevated and we prescribed hydralazine 25 mg p.o. twice daily.  Apparently she never picked the medication.  She states the medications were never sent in to her pharmacy.  States she recently switched her pharmacy.  Blood pressure is elevated at 178/96 today.  Previous blood pressure in epic 129/82 on 04/18/2020.  Patient states she stays anxious all the time.  She states they took my trazodone away.  Denies any current palpitations or arrhythmias, orthostatic symptoms, CVA or TIA-like symptoms, PND, orthopnea.  No claudication-like symptoms, DVT or PE-like symptoms, or lower extremity edema.  Past Medical History:  Diagnosis Date  . Benzodiazepine dependence ([place]) 12/30/2017  . Chronic pain syndrome   . Complication of anesthesia   . Diverticulosis   . 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 family unaware  . Dysphagia   . Dyspnea   . Generalized weakness   . GERD (gastroesophageal reflux disease)   . GERD (gastroesophageal reflux disease)   . High cholesterol   . Hoarseness   . Hx of cardiovascular stress test    negative 2012 and 2016  . Hypertension   . Insomnia   . Intervertebral disc disorder with myelopathy, lumbar region   . Lumbago   . Lumbar  spine pain   . PONV (postoperative nausea and vomiting)    confusion after anesthesia  . Renal insufficiency   . Ringing in ears    resolved  . Spondylosis with myelopathy, lumbar region    legs, back    Past Surgical History:  Procedure Laterality Date  . ABDOMINAL HYSTERECTOMY    . BACK SURGERY    . BONE MARROW ASPIRATION    . CERVICAL SPINE SURGERY    . EYE SURGERY Bilateral     Lasic  . FIXATION KYPHOPLASTY    . LAMINOTOMY    . SPINAL FUSION    . 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   Procedure: TOTAL HIP ARTHROPLASTY ANTERIOR APPROACH;  Surgeon: Kaitlyn Cancel, MD;  Location: [facility];  Service: Orthopedics;  Laterality: Right;  70 mins    Current Outpatient Medications  Medication Sig Dispense Refill  . acetaminophen (TYLENOL) 500 MG tablet Take 2 tablets (1,000 mg total) by mouth every 8 (eight) hours. 30 tablet 0  . atenolol (TENORMIN) 25 MG tablet Take 1 tablet (25 mg total) by mouth daily. 90 tablet 3  . benzonatate (TESSALON) 100 MG capsule Take 100 mg by mouth as needed for cough.    . cyclobenzaprine (FLEXERIL) 10 MG tablet Take 10 mg by mouth as needed for muscle spasms.    . diclofenac (VOLTAREN) 75 MG EC tablet TAKE ONE TABLET BY MOUTH TWICE DAILY AS NEEDED FOR MUSCLE AND JOINT PAIN 60 tablet 1  . diltiazem (TIAZAC) 120 MG 24 hr capsule Take 1 capsule (120 mg total) by mouth daily. 90 capsule 0  . docusate sodium (COLACE) 100 MG capsule Take 1 capsule (100 mg total) by mouth 2 (two) times daily. 28 capsule 0  . EPINEPHrine 0.3 mg/0.3 mL IJ SOAJ injection Inject 0.3 mLs (0.3 mg total) into the muscle once. (Patient taking differently: Inject 0.3 mg into the muscle as needed for anaphylaxis.) 2 Device 1  . FEMRING 0.05 MG/24HR RING INSERT VAGINALLY AS DIRECTED EVERY 3 MONTHS 1 each 1  . fluticasone (FLONASE) 50 MCG/ACT nasal spray Place 2 sprays into both nostrils daily as needed for allergies. 16 g 12  . HYDROcodone-acetaminophen (NORCO) 10-325 MG tablet Take 1 tablet by mouth as needed.    Marland Kitchen lisinopril (ZESTRIL) 40 MG tablet TAKE ONE TABLET BY MOUTH DAILY 30 tablet 0  . loratadine (CLARITIN) 10 MG tablet Take 1 tablet (10 mg total) by mouth daily. 30 tablet 11  . polyethylene glycol (MIRALAX / GLYCOLAX) 17 g packet Take 17 g by mouth 2 (two) times daily. 28 packet 0  . tiZANidine (ZANAFLEX) 2 MG tablet Take 2 mg by mouth 2 (two) times daily.    .  hydrALAZINE (APRESOLINE) 25 MG tablet Take 1 tablet (25 mg total) by mouth 2 (two) times daily. 60 tablet 6   No current facility-administered medications for this visit.   Allergies:  Bee venom, Hydrocodone, Penicillins, Amlodipine, Elavil [amitriptyline hcl], and Statins   Social History: The patient  reports that she quit smoking about 56 years ago. Her smoking use included cigarettes. She started smoking about 60 years ago. She has a 12.00 pack-year smoking history. She has never used smokeless tobacco. She reports that she does not drink alcohol and does not use drugs.   Family History: The patient's family history includes Diabetes in her mother; Gallbladder disease in her mother and another family member; Heart disease in her father and mother; Hyperlipidemia in her brother; Hypertension in her brother, child, and mother;  Pneumonia in her sister.   ROS:  Please see the history of present illness. Otherwise, complete review of systems is positive for none.  All other systems are reviewed and negative.   Physical Exam: VS:  BP (!) 178/96   Pulse 77   Ht 5\' 2"  (1.575 m)   Wt 145 lb 3.2 oz (65.9 kg)   SpO2 97%   BMI 26.56 kg/m , BMI Body mass index is 26.56 kg/m.  Wt Readings from Last 3 Encounters:  06/08/20 145 lb 3.2 oz (65.9 kg)  02/17/20 140 lb 9.6 oz (63.8 kg)  12/17/19 140 lb (63.5 kg)    General: Patient appears comfortable at rest. Neck: Supple, no elevated JVP or carotid bruits, no thyromegaly. Lungs: Clear to auscultation, nonlabored breathing at rest. Cardiac: Regular rate and rhythm, no S3 or significant systolic murmur, no pericardial rub. Extremities: No pitting edema, distal pulses 2+. Skin: Warm and dry. Musculoskeletal: No kyphosis.   Neuropsychiatric: Alert and oriented x3, affect grossly appropriate.  ECG: 02/17/2020 sinus bradycardia with a rate of 58, left axis deviation, minimal voltage criteria for LVH  Recent Labwork: 08/19/2019: Hemoglobin 14.4;  Platelets 245; TSH 0.783 04/18/2020: ALT 15; AST 16; BUN 16; Creatinine, Ser 0.67; Potassium 4.6; Sodium 139     Component Value Date/Time   CHOL 176 07/10/2018 1135   TRIG 113 07/10/2018 1135   HDL 51 07/10/2018 1135   CHOLHDL 3.5 07/10/2018 1135   LDLCALC 102 (H) 07/10/2018 1135    Other Studies Reviewed Today:  CHEST - 2 VIEW 02/17/2020  COMPARISON:  01/28/2020 and 12/09/2015  FINDINGS: Lungs are adequately inflated without focal airspace consolidation or effusion. Cardiomediastinal silhouette is normal. Partially visualized fusion hardware over the cervical spine and thoracolumbar spine intact and unchanged.  IMPRESSION: No acute cardiopulmonary disease.    Echo 04/2010 [facility] LVEF >70%, grade I diastolic dysfunction, mild MR  04/2010 ExerciseMyoview No ischemia. Exercised 8 minutes, 9 METs, 95% THR.  12/2007 Renal artery [place] No stenosis, normal kidney size.   06/01/13 Clinic EKG NSR, LAD  05/2014 Exercise MPI IMPRESSION: 1. No reversible ischemia or infarction.  2. Normal left ventricular wall motion.  3. Left ventricular ejection fraction 57%  4. Low-risk stress test findings*.   08/2014 Echo Study Conclusions  - Left ventricle: The cavity size was normal. Wall thickness was normal. Systolic function was normal. The estimated ejection fraction was in the range of 60% to 65%. Wall motion was normal; there were no regional wall motion abnormalities. Doppler parameters are consistent with abnormal left ventricular relaxation (grade 1 diastolic dysfunction). - Aortic valve: Mildly calcified annulus. Trileaflet. - Mitral valve: Mildly calcified annulus. Mildly calcified leaflets . There was mild regurgitation. - Tricuspid valve: There was mild regurgitation  Assessment and Plan:  1.  Chest wall pain Denies any recurrent chest wall pain or anginal-like symptoms.  1. Essential hypertension At prior visit her blood pressure  was elevated and lisinopril was increased to 40 mg daily.  On subsequent visit blood pressure remained elevated and we added hydralazine 25 mg p.o. twice daily.  Apparently the patient has not taken the hydralazine as of yet.  Blood pressure is elevated today.  She states the medication was never sent in but we have a record of the medication being sent.  Advised her to start checking her blood pressure 2-3 times a week.  Goal is 130/80 or less.  If elevated consistently above 130/80 she needs to call us.  Continue atenolol 25 mg daily.  Continue lisinopril 40 mg daily.  Start hydralazine 25 mg p.o. twice daily  2. Palpitations Denies any recent palpitations or arrhythmias.  Continue diltiazem 120 mg daily.  Continue atenolol 25 mg daily.  Medication Adjustments/Labs and Tests Ordered: Current medicines are reviewed at length with the patient today.  Concerns regarding medicines are outlined above.   Disposition: Follow-up with Kaitlyn Serrano or APP 6 months Signed, Levell July, NP 06/08/2020 2:09 PM    [facility], [place], [place] [address] Phone: [phone]; Fax: [phone]

## 2020-06-08 ENCOUNTER — Other Ambulatory Visit: Payer: Self-pay

## 2020-06-08 ENCOUNTER — Ambulatory Visit (INDEPENDENT_AMBULATORY_CARE_PROVIDER_SITE_OTHER): Payer: Medicare HMO | Admitting: Family Medicine

## 2020-06-08 ENCOUNTER — Encounter: Payer: Self-pay | Admitting: Family Medicine

## 2020-06-08 VITALS — BP 178/96 | HR 77 | Ht 62.0 in | Wt 145.2 lb

## 2020-06-08 DIAGNOSIS — I10 Essential (primary) hypertension: Secondary | ICD-10-CM

## 2020-06-08 DIAGNOSIS — R002 Palpitations: Secondary | ICD-10-CM

## 2020-06-08 DIAGNOSIS — R0789 Other chest pain: Secondary | ICD-10-CM | POA: Diagnosis not present

## 2020-06-08 MED ORDER — HYDRALAZINE HCL 25 MG PO TABS: 25.0000 mg | ORAL_TABLET | Freq: Two times a day (BID) | ORAL | 6 refills | Status: DC

## 2020-06-08 NOTE — Patient Instructions (Addendum)
Medication Instructions:   Begin Hydralazine 25mg  twice a day.  Continue all other medications.    Labwork: none  Testing/Procedures: none  Follow-Up: 6 months   Any Other Special Instructions Will Be Listed Below (If Applicable).  If you need a refill on your cardiac medications before your next appointment, please call your pharmacy.

## 2020-06-14 ENCOUNTER — Telehealth: Payer: Self-pay | Admitting: Family Medicine

## 2020-06-14 NOTE — Telephone Encounter (Signed)
Patient is requesting rx for trazodone. It looks like rx was discontinued on 04/14 but patient states she should still be taking. Please review

## 2020-06-14 NOTE — Telephone Encounter (Signed)
[facility] make [facility] days w/ 3 RF 03/2020.

## 2020-06-14 NOTE — Telephone Encounter (Signed)
Pt is having issues with her prescriptions and is unable to get refills. Please call back

## 2020-06-15 NOTE — Telephone Encounter (Signed)
[facility] - pt picked up yesterday

## 2020-06-20 ENCOUNTER — Ambulatory Visit: Payer: Self-pay | Admitting: Family Medicine

## 2020-06-22 ENCOUNTER — Other Ambulatory Visit: Payer: Self-pay | Admitting: Family Medicine

## 2020-06-22 DIAGNOSIS — I10 Essential (primary) hypertension: Secondary | ICD-10-CM

## 2020-06-23 ENCOUNTER — Other Ambulatory Visit: Payer: Self-pay | Admitting: Family Medicine

## 2020-06-23 DIAGNOSIS — G894 Chronic pain syndrome: Secondary | ICD-10-CM | POA: Diagnosis not present

## 2020-06-23 DIAGNOSIS — M791 Myalgia, unspecified site: Secondary | ICD-10-CM | POA: Diagnosis not present

## 2020-06-23 DIAGNOSIS — M5106 Intervertebral disc disorders with myelopathy, lumbar region: Secondary | ICD-10-CM | POA: Diagnosis not present

## 2020-06-23 DIAGNOSIS — I10 Essential (primary) hypertension: Secondary | ICD-10-CM

## 2020-07-03 ENCOUNTER — Ambulatory Visit: Payer: Medicare Other | Admitting: Nurse Practitioner

## 2020-07-07 ENCOUNTER — Encounter: Payer: Self-pay | Admitting: Family Medicine

## 2020-07-10 ENCOUNTER — Telehealth: Payer: Self-pay

## 2020-07-11 ENCOUNTER — Telehealth: Payer: Self-pay | Admitting: Family Medicine

## 2020-07-11 NOTE — Telephone Encounter (Signed)
Lmtcb.

## 2020-07-12 DIAGNOSIS — I951 Orthostatic hypotension: Secondary | ICD-10-CM | POA: Diagnosis not present

## 2020-07-12 DIAGNOSIS — M1990 Unspecified osteoarthritis, unspecified site: Secondary | ICD-10-CM | POA: Diagnosis not present

## 2020-07-12 DIAGNOSIS — F411 Generalized anxiety disorder: Secondary | ICD-10-CM | POA: Diagnosis not present

## 2020-07-12 DIAGNOSIS — K08409 Partial loss of teeth, unspecified cause, unspecified class: Secondary | ICD-10-CM | POA: Diagnosis not present

## 2020-07-12 DIAGNOSIS — Z008 Encounter for other general examination: Secondary | ICD-10-CM | POA: Diagnosis not present

## 2020-07-12 DIAGNOSIS — G8929 Other chronic pain: Secondary | ICD-10-CM | POA: Diagnosis not present

## 2020-07-12 DIAGNOSIS — I10 Essential (primary) hypertension: Secondary | ICD-10-CM | POA: Diagnosis not present

## 2020-07-12 DIAGNOSIS — H547 Unspecified visual loss: Secondary | ICD-10-CM | POA: Diagnosis not present

## 2020-07-12 DIAGNOSIS — J301 Allergic rhinitis due to pollen: Secondary | ICD-10-CM | POA: Diagnosis not present

## 2020-07-12 DIAGNOSIS — G4700 Insomnia, unspecified: Secondary | ICD-10-CM | POA: Diagnosis not present

## 2020-07-12 DIAGNOSIS — R69 Illness, unspecified: Secondary | ICD-10-CM | POA: Diagnosis not present

## 2020-07-17 NOTE — Telephone Encounter (Signed)
Encounter closed, call was never returned

## 2020-07-23 ENCOUNTER — Other Ambulatory Visit: Payer: Self-pay | Admitting: Family Medicine

## 2020-07-23 DIAGNOSIS — I10 Essential (primary) hypertension: Secondary | ICD-10-CM

## 2020-07-25 DIAGNOSIS — M791 Myalgia, unspecified site: Secondary | ICD-10-CM | POA: Diagnosis not present

## 2020-07-25 DIAGNOSIS — M5106 Intervertebral disc disorders with myelopathy, lumbar region: Secondary | ICD-10-CM | POA: Diagnosis not present

## 2020-07-25 DIAGNOSIS — G894 Chronic pain syndrome: Secondary | ICD-10-CM | POA: Diagnosis not present

## 2020-08-18 DIAGNOSIS — R5383 Other fatigue: Secondary | ICD-10-CM | POA: Diagnosis not present

## 2020-08-18 DIAGNOSIS — R079 Chest pain, unspecified: Secondary | ICD-10-CM | POA: Diagnosis not present

## 2020-08-18 DIAGNOSIS — R350 Frequency of micturition: Secondary | ICD-10-CM | POA: Diagnosis not present

## 2020-08-18 DIAGNOSIS — R0789 Other chest pain: Secondary | ICD-10-CM | POA: Diagnosis not present

## 2020-08-18 DIAGNOSIS — I10 Essential (primary) hypertension: Secondary | ICD-10-CM | POA: Diagnosis not present

## 2020-08-18 DIAGNOSIS — R0981 Nasal congestion: Secondary | ICD-10-CM | POA: Diagnosis not present

## 2020-08-24 ENCOUNTER — Ambulatory Visit (INDEPENDENT_AMBULATORY_CARE_PROVIDER_SITE_OTHER): Payer: Medicare HMO | Admitting: Family Medicine

## 2020-08-24 ENCOUNTER — Encounter: Payer: Self-pay | Admitting: Family Medicine

## 2020-08-24 ENCOUNTER — Other Ambulatory Visit: Payer: Self-pay

## 2020-08-24 VITALS — BP 172/87 | HR 72 | Temp 97.5°F | Ht 62.0 in | Wt 142.2 lb

## 2020-08-24 DIAGNOSIS — B9689 Other specified bacterial agents as the cause of diseases classified elsewhere: Secondary | ICD-10-CM | POA: Diagnosis not present

## 2020-08-24 DIAGNOSIS — R300 Dysuria: Secondary | ICD-10-CM | POA: Diagnosis not present

## 2020-08-24 DIAGNOSIS — N760 Acute vaginitis: Secondary | ICD-10-CM

## 2020-08-24 DIAGNOSIS — I10 Essential (primary) hypertension: Secondary | ICD-10-CM

## 2020-08-24 LAB — URINALYSIS, ROUTINE W REFLEX MICROSCOPIC
Bilirubin, UA: NEGATIVE
Glucose, UA: NEGATIVE
Ketones, UA: NEGATIVE
Leukocytes,UA: NEGATIVE
Nitrite, UA: NEGATIVE
Protein,UA: NEGATIVE
RBC, UA: NEGATIVE
Specific Gravity, UA: 1.015 (ref 1.005–1.030)
Urobilinogen, Ur: 0.2 mg/dL (ref 0.2–1.0)
pH, UA: 6.5 (ref 5.0–7.5)

## 2020-08-24 LAB — WET PREP FOR TRICH, YEAST, CLUE
Clue Cell Exam: POSITIVE — AB
Trichomonas Exam: NEGATIVE
Yeast Exam: NEGATIVE

## 2020-08-24 LAB — MICROSCOPIC EXAMINATION: RBC, Urine: NONE SEEN /HPF (ref 0–2)

## 2020-08-24 MED ORDER — METRONIDAZOLE 500 MG PO TABS: 500.0000 mg | ORAL_TABLET | Freq: Two times a day (BID) | ORAL | 0 refills | Status: DC

## 2020-08-24 NOTE — Progress Notes (Signed)
Acute Office Visit  Subjective:    Patient ID: Kaitlyn Serrano, female    DOB: 06-27-1940, 80 y.o.   MRN: [id number]  Chief Complaint  Patient presents with   Dysuria   Vaginitis    HPI Patient is in today for dysuria and vaginal itching x 2 days. Denies vaginal discharge or odor. Denies fever, chills, abdominal pain, nausea, vomiting, or flank pain. She has been staying well hydrated.   She has not taken her BP medications today. She denies chest pain, shortness of breath, visual disturbances, edema, dizziness, or focal weakness.  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 family unaware   Dysphagia    Dyspnea    Generalized weakness    GERD (gastroesophageal reflux disease)    GERD (gastroesophageal reflux disease)    High cholesterol    Hoarseness    Hx of cardiovascular stress test    negative 2012 and 2016   Hypertension    Insomnia    Intervertebral disc disorder with myelopathy, lumbar region    Lumbago    Lumbar spine pain    PONV (postoperative nausea and vomiting)    confusion after anesthesia   Renal insufficiency    Ringing in ears    resolved   Spondylosis with myelopathy, lumbar region    legs, back    Past Surgical History:  Procedure Laterality Date   ABDOMINAL HYSTERECTOMY     BACK SURGERY     BONE MARROW ASPIRATION     CERVICAL SPINE SURGERY     EYE SURGERY Bilateral    Lasic   FIXATION KYPHOPLASTY     LAMINOTOMY     SPINAL FUSION     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   Procedure: TOTAL HIP ARTHROPLASTY ANTERIOR APPROACH;  Surgeon: Paralee Cancel, MD;  Location: [facility];  Service: Orthopedics;  Laterality: Right;  70 mins    Family History  Problem Relation Age of Onset   Diabetes Mother    Heart disease Mother    Hypertension Mother    Gallbladder disease Mother    Heart disease  Father    Pneumonia Sister    Hypertension Brother    Hyperlipidemia Brother    Gallbladder disease Other    Hypertension Child     Social History   Socioeconomic History   Marital status: Divorced    Spouse name: Not on file   Number of children: 2   Years of education: Not on file   Highest education level: Not on file  Occupational History   Not on file  Tobacco Use   Smoking status: Former    Packs/day: 4.00    Years: 3.00    Pack years: 12.00    Types: Cigarettes    Start date: 06/10/1960    Quit date: 02/20/1964    Years since quitting: 56.5   Smokeless tobacco: Never  Vaping Use   Vaping Use: Never used  Substance and Sexual Activity   Alcohol use: No    Alcohol/week: 0.0 standard drinks   Drug use: No   Sexual activity: Not Currently    Birth control/protection: None  Other Topics Concern   Not on file  Social History Narrative   ** Merged History Encounter **      Kaitlyn Serrano lives alone. She is separated/divorced from her second  husband and widowed by her first husband. She has twin daughters, Kaitlyn Serrano and Kaitlyn Serrano. Kaitlyn Serrano has been a cause of stress for her and she believes was preventing her from getting medical care that she needed. She is no longer talking to Kaitlyn Serrano. She states that Kaitlyn Serrano is involved in her care and that she trusts her to have her best interest in mind.        Social Determinants of Health   Financial Resource Strain: Not on file  Food Insecurity: Not on file  Transportation Needs: Not on file  Physical Activity: Not on file  Stress: Not on file  Social Connections: Not on file  Intimate Partner Violence: Not on file    Outpatient Medications Prior to Visit  Medication Sig Dispense Refill   acetaminophen (TYLENOL) 500 MG tablet Take 2 tablets (1,000 mg total) by mouth every 8 (eight) hours. 30 tablet 0   atenolol (TENORMIN) 25 MG tablet Take 1 tablet (25 mg total) by mouth daily. 90 tablet 3   cyclobenzaprine (FLEXERIL) 10 MG tablet  Take 10 mg by mouth as needed for muscle spasms.     diclofenac (VOLTAREN) 75 MG EC tablet TAKE ONE TABLET BY MOUTH TWICE DAILY AS NEEDED FOR MUSCLE AND JOINT PAIN 60 tablet 0   docusate sodium (COLACE) 100 MG capsule Take 1 capsule (100 mg total) by mouth 2 (two) times daily. 28 capsule 0   EPINEPHrine 0.3 mg/0.3 mL IJ SOAJ injection Inject 0.3 mLs (0.3 mg total) into the muscle once. (Patient taking differently: Inject 0.3 mg into the muscle as needed for anaphylaxis.) 2 Device 1   FEMRING 0.05 MG/24HR RING INSERT VAGINALLY AS DIRECTED EVERY 3 MONTHS 1 each 1   fluticasone (FLONASE) 50 MCG/ACT nasal spray Place 2 sprays into both nostrils daily as needed for allergies. 16 g 12   hydrALAZINE (APRESOLINE) 25 MG tablet Take 1 tablet (25 mg total) by mouth 2 (two) times daily. 60 tablet 6   HYDROcodone-acetaminophen (NORCO) 10-325 MG tablet Take 1 tablet by mouth as needed.     lisinopril (ZESTRIL) 40 MG tablet TAKE ONE TABLET BY MOUTH DAILY 30 tablet 0   loratadine (CLARITIN) 10 MG tablet Take 1 tablet (10 mg total) by mouth daily. 30 tablet 11   polyethylene glycol (MIRALAX / GLYCOLAX) 17 g packet Take 17 g by mouth 2 (two) times daily. 28 packet 0   TIADYLT ER 120 MG 24 hr capsule TAKE 1 CAPSULE BY MOUTH EVERY DAY 90 capsule 0   tiZANidine (ZANAFLEX) 2 MG tablet Take 2 mg by mouth 2 (two) times daily.     benzonatate (TESSALON) 100 MG capsule Take 100 mg by mouth as needed for cough.     No facility-administered medications prior to visit.    Allergies  Allergen Reactions   Bee Venom Anaphylaxis   Hydrocodone Swelling   Penicillins Anaphylaxis and Rash    Has patient had a PCN reaction causing immediate rash, facial/tongue/throat swelling, SOB or lightheadedness with hypotension: Yes Has patient had a PCN reaction causing severe rash involving mucus membranes or skin necrosis: Yes Has patient had a PCN reaction that required hospitalization: Yes Has patient had a PCN reaction occurring  within the last 10 years: No If all of the above answers are "NO", then may proceed with Cephalosporin use.    Amlodipine Swelling   Elavil [Amitriptyline Hcl] Other (See Comments)    Confusion, hallucinations   Statins Swelling and Other (See Comments)    Peeling of skin  Review of Systems As per HPI.    Objective:    Physical Exam Vitals and nursing note reviewed.  Constitutional:      General: She is not in acute distress.    Appearance: She is not ill-appearing, toxic-appearing or diaphoretic.  Cardiovascular:     Rate and Rhythm: Normal rate and regular rhythm.     Heart sounds: Normal heart sounds. No murmur heard. Pulmonary:     Effort: Pulmonary effort is normal. No respiratory distress.     Breath sounds: Normal breath sounds.  Abdominal:     General: Bowel sounds are normal. There is no distension.     Palpations: Abdomen is soft.     Tenderness: There is no abdominal tenderness. There is no right CVA tenderness, left CVA tenderness, guarding or rebound.  Musculoskeletal:     Right lower leg: No edema.     Left lower leg: No edema.  Skin:    General: Skin is warm and dry.  Neurological:     General: No focal deficit present.     Mental Status: She is alert and oriented to person, place, and time.  Psychiatric:        Mood and Affect: Mood normal.        Behavior: Behavior normal.    BP (!) 172/87   Pulse 72   Temp (!) 97.5 F (36.4 C) (Oral)   Ht 5\' 2"  (1.575 m)   Wt 142 lb 4 oz (64.5 kg)   BMI 26.02 kg/m  Wt Readings from Last 3 Encounters:  08/24/20 142 lb 4 oz (64.5 kg)  06/08/20 145 lb 3.2 oz (65.9 kg)  02/17/20 140 lb 9.6 oz (63.8 kg)   Urine dipstick shows negative for all components.  Micro exam: moderate + bacteria.  Microscopic wet-mount exam shows clue cells, excessive bacteria.   Health Maintenance Due  Topic Date Due   Zoster Vaccines- Shingrix (1 of 2) Never done   COVID-19 Vaccine (3 - Moderna risk series) 06/22/2019     There are no preventive care reminders to display for this patient.   Lab Results  Component Value Date   TSH 0.783 08/19/2019   Lab Results  Component Value Date   WBC 7.5 08/19/2019   HGB 14.4 08/19/2019   HCT 44.1 08/19/2019   MCV 89 08/19/2019   PLT 245 08/19/2019   Lab Results  Component Value Date   NA 139 04/18/2020   K 4.6 04/18/2020   CO2 25 04/18/2020   GLUCOSE 131 (H) 04/18/2020   BUN 16 04/18/2020   CREATININE 0.67 04/18/2020   BILITOT 0.5 04/18/2020   ALKPHOS 117 04/18/2020   AST 16 04/18/2020   ALT 15 04/18/2020   PROT 6.2 04/18/2020   ALBUMIN 3.9 04/18/2020   CALCIUM 9.2 04/18/2020   ANIONGAP 9 06/09/2019   GFR 63.94 01/31/2016   Lab Results  Component Value Date   CHOL 176 07/10/2018   Lab Results  Component Value Date   HDL 51 07/10/2018   Lab Results  Component Value Date   LDLCALC 102 (H) 07/10/2018   Lab Results  Component Value Date   TRIG 113 07/10/2018   Lab Results  Component Value Date   CHOLHDL 3.5 07/10/2018   Lab Results  Component Value Date   HGBA1C 5.3 08/05/2018       Assessment & Plan:   Ruchy was seen today for dysuria and vaginitis.  Diagnoses and all orders for this visit:  Dysuria UA negative, culture  pending. Increase fluids, cranberry juice.  -     Urinalysis, Routine w reflex microscopic -     Urine Culture  Bacterial vaginosis Flagyl as below. Handout given.  -     WET PREP FOR TRICH, YEAST, CLUE -     metroNIDAZOLE (FLAGYL) 500 MG tablet; Take 1 tablet (500 mg total) by mouth 2 (two) times daily.  Essential hypertension Uncontrolled, asymptomatic. Patient has not taken BP medications this morning. Instructed patient to take medications as prescribed and notify if BP remains elevated.   Return to office for new or worsening symptoms, or if symptoms persist.   The patient indicates understanding of these issues and agrees with the plan.    Gwenlyn Perking, FNP

## 2020-08-24 NOTE — Patient Instructions (Signed)
Bacterial Vaginosis °Bacterial vaginosis is an infection that occurs when the normal balance of bacteria in the vagina changes. This change is caused by an overgrowth of certain bacteria in the vagina. Bacterial vaginosis is the most common vaginal infection among females aged 80 to 44 years. °This condition increases the risk of sexually transmitted infections (STIs). Treatment can help reduce this risk. Treatment is very important for pregnant women because this condition can cause babies to be born early (prematurely) or at a low birth weight. °What are the causes? °This condition is caused by an increase in harmful bacteria that are normally present in small amounts in the vagina. However, the exact reason this condition develops is not known. °You cannot get bacterial vaginosis from toilet seats, bedding, swimming pools, or contact with objects around you. °What increases the risk? °The following factors may make you more likely to develop this condition: °Having a new sexual partner or multiple sexual partners, or having unprotected sex. °Douching. °Having an intrauterine device (IUD). °Smoking. °Abusing drugs and alcohol. This may lead to riskier sexual behavior. °Taking certain antibiotic medicines. °Being pregnant. °What are the signs or symptoms? °Some women with this condition have no symptoms. Symptoms may include: °Gray or white vaginal discharge. The discharge can be watery or foamy. °A fish-like odor with discharge, especially after sex or during menstruation. °Itching in and around the vagina. °Burning or pain with urination. °How is this diagnosed? °This condition is diagnosed based on: °Your medical history. °A physical exam of the vagina. °Checking a sample of vaginal fluid for harmful bacteria or abnormal cells. °How is this treated? °This condition is treated with antibiotic medicines. These may be given as a pill, a vaginal cream, or a medicine that is put into the vagina (suppository). If the  condition comes back after treatment, a second round of antibiotics may be needed. °Follow these instructions at home: °Medicines °Take or apply over-the-counter and prescription medicines only as told by your health care provider. °Take or apply your antibiotic medicine as told by your health care provider. Do not stop using the antibiotic even if you start to feel better. °General instructions °If you have a female sexual partner, tell her that you have a vaginal infection. She should follow up with her health care provider. If you have a female sexual partner, he does not need treatment. °Avoid sexual activity until you finish treatment. °Drink enough fluid to keep your urine pale yellow. °Keep the area around your vagina and rectum clean. °Wash the area daily with warm water. °Wipe yourself from front to back after using the toilet. °If you are breastfeeding, talk to your health care provider about continuing breastfeeding during treatment. °Keep all follow-up visits. This is important. °How is this prevented? °Self-care °Do not douche. °Wash the outside of your vagina with warm water only. °Wear cotton or cotton-lined underwear. °Avoid wearing tight pants and pantyhose, especially during the summer. °Safe sex °Use protection when having sex. This includes: °Using condoms. °Using dental dams. This is a thin layer of a material made of latex or polyurethane that protects the mouth during oral sex. °Limit the number of sexual partners. To help prevent bacterial vaginosis, it is best to have sex with just one partner (monogamous relationship). °Make sure you and your sexual partner are tested for STIs. °Drugs and alcohol °Do not use any products that contain nicotine or tobacco. These products include cigarettes, chewing tobacco, and vaping devices, such as e-cigarettes. If you need help quitting,   ask your health care provider. °Do not use drugs. °Do not drink alcohol if: °Your health care provider tells you not to  do this. °You are pregnant, may be pregnant, or are planning to become pregnant. °If you drink alcohol: °Limit how much you have to 0-1 drink a day. °Be aware of how much alcohol is in your drink. In the [place] one drink equals one 12 oz bottle of beer (355 mL), one 5 oz glass of wine (148 mL), or one 1½ oz glass of hard liquor (44 mL). °Where to find more information °Centers for Disease Control and Prevention: www.cdc.gov °American Sexual Health Association (ASHA): www.ashastd.org °[place] Department of Health and Human Services, Office on Women's Health: www.womenshealth.gov °Contact a health care provider if: °Your symptoms do not improve, even after treatment. °You have more discharge or pain when urinating. °You have a fever or chills. °You have pain in your abdomen or pelvis. °You have pain during sex. °You have vaginal bleeding between menstrual periods. °Summary °Bacterial vaginosis is a vaginal infection that occurs when the normal balance of bacteria in the vagina changes. It results from an overgrowth of certain bacteria. °This condition increases the risk of sexually transmitted infections (STIs). Getting treated can help reduce this risk. °Treatment is very important for pregnant women because this condition can cause babies to be born early (prematurely) or at low birth weight. °This condition is treated with antibiotic medicines. These may be given as a pill, a vaginal cream, or a medicine that is put into the vagina (suppository). °This information is not intended to replace advice given to you by your health care provider. Make sure you discuss any questions you have with your health care provider. °Document Revised: 08/12/2019 Document Reviewed: 08/12/2019 °Elsevier Patient Education © 2022 [employer] ° °

## 2020-08-26 LAB — URINE CULTURE

## 2020-08-28 ENCOUNTER — Other Ambulatory Visit: Payer: Self-pay | Admitting: Family Medicine

## 2020-09-05 DIAGNOSIS — M5106 Intervertebral disc disorders with myelopathy, lumbar region: Secondary | ICD-10-CM | POA: Diagnosis not present

## 2020-09-05 DIAGNOSIS — G894 Chronic pain syndrome: Secondary | ICD-10-CM | POA: Diagnosis not present

## 2020-09-14 DIAGNOSIS — H2513 Age-related nuclear cataract, bilateral: Secondary | ICD-10-CM | POA: Diagnosis not present

## 2020-09-14 DIAGNOSIS — H40033 Anatomical narrow angle, bilateral: Secondary | ICD-10-CM | POA: Diagnosis not present

## 2020-09-15 ENCOUNTER — Other Ambulatory Visit: Payer: Self-pay | Admitting: Family Medicine

## 2020-09-18 ENCOUNTER — Telehealth: Payer: Self-pay | Admitting: *Deleted

## 2020-09-18 NOTE — Telephone Encounter (Signed)
from [facility] for coverage changes - no longer covering [facility] 0.'05mg'$ /day vag ring.  PA started   (Key: [id number])  Sent to Plan today

## 2020-09-18 NOTE — Telephone Encounter (Signed)
Approved today PA Case: [id number], Status: Approved, Coverage Starts on: 02/26/2020 12:00:00 AM, Coverage Ends on: 02/24/2021 12:00:00 AM. Questions? Contact [phone].  Pharm aware

## 2020-09-25 ENCOUNTER — Other Ambulatory Visit: Payer: Self-pay | Admitting: Family Medicine

## 2020-09-25 DIAGNOSIS — I10 Essential (primary) hypertension: Secondary | ICD-10-CM

## 2020-10-01 ENCOUNTER — Other Ambulatory Visit: Payer: Self-pay | Admitting: Family Medicine

## 2020-10-01 DIAGNOSIS — F5104 Psychophysiologic insomnia: Secondary | ICD-10-CM

## 2020-10-03 ENCOUNTER — Other Ambulatory Visit: Payer: Self-pay | Admitting: Cardiology

## 2020-10-04 ENCOUNTER — Other Ambulatory Visit: Payer: Self-pay | Admitting: *Deleted

## 2020-10-04 DIAGNOSIS — B9689 Other specified bacterial agents as the cause of diseases classified elsewhere: Secondary | ICD-10-CM

## 2020-10-05 ENCOUNTER — Encounter: Payer: Self-pay | Admitting: Family Medicine

## 2020-10-09 ENCOUNTER — Telehealth: Payer: Self-pay | Admitting: Family Medicine

## 2020-10-09 NOTE — Telephone Encounter (Signed)
  Prescription Request  10/09/2020  What is the name of the medication or equipment? Metronidazole 500  Have you contacted your pharmacy to request a refill? (if applicable) na  Which pharmacy would you like this sent to? [facility]   Patient notified that their request is being sent to the clinical staff for review and that they should receive a response within 2 business days.

## 2020-10-09 NOTE — Telephone Encounter (Signed)
Informed pharmacy that we do not do refills on this medication, pt has to be seen

## 2020-10-17 DIAGNOSIS — G894 Chronic pain syndrome: Secondary | ICD-10-CM | POA: Diagnosis not present

## 2020-10-17 DIAGNOSIS — M791 Myalgia, unspecified site: Secondary | ICD-10-CM | POA: Diagnosis not present

## 2020-10-17 DIAGNOSIS — Z6822 Body mass index (BMI) 22.0-22.9, adult: Secondary | ICD-10-CM | POA: Diagnosis not present

## 2020-10-17 DIAGNOSIS — M5106 Intervertebral disc disorders with myelopathy, lumbar region: Secondary | ICD-10-CM | POA: Diagnosis not present

## 2020-10-18 ENCOUNTER — Other Ambulatory Visit: Payer: Self-pay | Admitting: Family Medicine

## 2020-10-18 DIAGNOSIS — F5104 Psychophysiologic insomnia: Secondary | ICD-10-CM

## 2020-10-24 ENCOUNTER — Other Ambulatory Visit: Payer: Self-pay

## 2020-10-24 ENCOUNTER — Other Ambulatory Visit: Payer: Self-pay | Admitting: Family Medicine

## 2020-10-24 ENCOUNTER — Encounter: Payer: Self-pay | Admitting: Family Medicine

## 2020-10-24 ENCOUNTER — Ambulatory Visit (INDEPENDENT_AMBULATORY_CARE_PROVIDER_SITE_OTHER): Payer: Medicare Other | Admitting: Family Medicine

## 2020-10-24 VITALS — BP 150/85 | HR 58 | Temp 98.1°F | Ht 62.0 in | Wt 137.6 lb

## 2020-10-24 DIAGNOSIS — F411 Generalized anxiety disorder: Secondary | ICD-10-CM | POA: Diagnosis not present

## 2020-10-24 DIAGNOSIS — T466X5A Adverse effect of antihyperlipidemic and antiarteriosclerotic drugs, initial encounter: Secondary | ICD-10-CM | POA: Diagnosis not present

## 2020-10-24 DIAGNOSIS — I252 Old myocardial infarction: Secondary | ICD-10-CM

## 2020-10-24 DIAGNOSIS — I10 Essential (primary) hypertension: Secondary | ICD-10-CM

## 2020-10-24 DIAGNOSIS — K219 Gastro-esophageal reflux disease without esophagitis: Secondary | ICD-10-CM

## 2020-10-24 DIAGNOSIS — Z91030 Bee allergy status: Secondary | ICD-10-CM

## 2020-10-24 MED ORDER — DILTIAZEM HCL ER BEADS 120 MG PO CP24: ORAL_CAPSULE | ORAL | 3 refills | Status: DC

## 2020-10-24 MED ORDER — EPINEPHRINE 0.3 MG/0.3ML IJ SOAJ: 0.3000 mg | Freq: Once | INTRAMUSCULAR | 0 refills | Status: AC

## 2020-10-24 MED ORDER — BUSPIRONE HCL 10 MG PO TABS: ORAL_TABLET | ORAL | 0 refills | Status: DC

## 2020-10-24 MED ORDER — PANTOPRAZOLE SODIUM 40 MG PO TBEC: 40.0000 mg | DELAYED_RELEASE_TABLET | Freq: Every day | ORAL | 3 refills | Status: DC | PRN

## 2020-10-24 NOTE — Progress Notes (Signed)
Subjective: KD:TOIZTI? PCP: Kaitlyn Norlander, DO WPY:KDXIPJA T Kaitlyn Serrano is a 80 y.o. female, who is accompanied today's visit by her granddaughter Kaitlyn Serrano.  She is presenting to clinic today for:  1. GAD She reports that her anxiety has returned.  She is currently off of Cymbalta, BuSpar.  At her last visit we were tapering her down from BuSpar because she did not feel it was needed.  She apparently had her Cymbalta discontinued by her specialist recently.  She reports feeling on edge and moody.  She is willing to go back on BuSpar.  Was on 10 mg twice daily before.  2.  GERD Patient reports her GERD has been flaring as well.  She would like to go back on pantoprazole.  No reports of vomiting but she is had some slight weight loss.  She is not eating like she normally had been  3.  Hypertension Patient is under the care of cardiology.  She has had some adjustments to her medications since her last visit.  She is currently being treated with atenolol, diltiazem, hydralazine, lisinopril.  Needs a refill on her diltiazem.  No chest pain, shortness of breath.  States blood pressure is the best that its looked in a while.  ROS: Per HPI  Allergies  Allergen Reactions   Bee Venom Anaphylaxis   Hydrocodone Swelling   Penicillins Anaphylaxis and Rash    Has patient had a PCN reaction causing immediate rash, facial/tongue/throat swelling, SOB or lightheadedness with hypotension: Yes Has patient had a PCN reaction causing severe rash involving mucus membranes or skin necrosis: Yes Has patient had a PCN reaction that required hospitalization: Yes Has patient had a PCN reaction occurring within the last 10 years: No If all of the above answers are "NO", then may proceed with Cephalosporin use.    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 family unaware   Dysphagia    Dyspnea    Generalized weakness    GERD (gastroesophageal reflux disease)    GERD (gastroesophageal reflux disease)    High cholesterol    Hoarseness    Hx of cardiovascular stress test    negative 2012 and 2016   Hypertension    Insomnia    Intervertebral disc disorder with myelopathy, lumbar region    Lumbago    Lumbar spine pain    PONV (postoperative nausea and vomiting)    confusion after anesthesia   Renal insufficiency    Ringing in ears    resolved   Spondylosis with myelopathy, lumbar region    legs, back    Current Outpatient Medications:    acetaminophen (TYLENOL) 500 MG tablet, Take 2 tablets (1,000 mg total) by mouth every 8 (eight) hours., Disp: 30 tablet, Rfl: 0   atenolol (TENORMIN) 25 MG tablet, TAKE 1 TABLET (25 MG TOTAL) BY MOUTH DAILY., Disp: 90 tablet, Rfl: 0   cyclobenzaprine (FLEXERIL) 10 MG tablet, Take 10 mg by mouth as needed for muscle spasms., Disp: , Rfl:    diclofenac (VOLTAREN) 75 MG EC tablet, TAKE ONE TABLET BY MOUTH TWICE DAILY AS NEEDED FOR MUSCLE AND JOINT PAIN, Disp: 60 tablet, Rfl:  0   docusate sodium (COLACE) 100 MG capsule, Take 1 capsule (100 mg total) by mouth 2 (two) times daily., Disp: 28 capsule, Rfl: 0   EPINEPHrine 0.3 mg/0.3 mL IJ SOAJ injection, Inject 0.3 mLs (0.3 mg total) into the muscle once. (Patient taking differently: Inject 0.3 mg into the muscle as needed for anaphylaxis.), Disp: 2 Device, Rfl: 1   FEMRING 0.05 MG/24HR RING, INSERT VAGINALLY AS DIRECTED EVERY 3 MONTHS, Disp: 1 each, Rfl: 2   fluticasone (FLONASE) 50 MCG/ACT nasal spray, Place 2 sprays into both nostrils daily as needed for allergies., Disp: 16 g, Rfl: 12   hydrALAZINE (APRESOLINE) 25 MG tablet, TAKE 1 TABLET BY MOUTH TWICE A DAY, Disp: 180 tablet, Rfl: 0    HYDROcodone-acetaminophen (NORCO) 10-325 MG tablet, Take 1 tablet by mouth as needed., Disp: , Rfl:    lisinopril (ZESTRIL) 40 MG tablet, TAKE 1 TABLET BY MOUTH DAILY, Disp: 90 tablet, Rfl: 3   loratadine (CLARITIN) 10 MG tablet, TAKE 1 TABLET BY MOUTH EVERY DAY, Disp: 90 tablet, Rfl: 1   metroNIDAZOLE (FLAGYL) 500 MG tablet, Take 1 tablet (500 mg total) by mouth 2 (two) times daily., Disp: 14 tablet, Rfl: 0   polyethylene glycol (MIRALAX / GLYCOLAX) 17 g packet, Take 17 g by mouth 2 (two) times daily., Disp: 28 packet, Rfl: 0   TIADYLT ER 120 MG 24 hr capsule, TAKE 1 CAPSULE BY MOUTH EVERY DAY, Disp: 90 capsule, Rfl: 0   tiZANidine (ZANAFLEX) 2 MG tablet, Take 2 mg by mouth 2 (two) times daily., Disp: , Rfl:  Social History   Socioeconomic History   Marital status: Divorced    Spouse name: Not on file   Number of children: 2   Years of education: Not on file   Highest education level: Not on file  Occupational History   Not on file  Tobacco Use   Smoking status: Former    Packs/day: 4.00    Years: 3.00    Pack years: 12.00    Types: Cigarettes    Start date: 06/10/1960    Quit date: 02/20/1964    Years since quitting: 56.7   Smokeless tobacco: Never  Vaping Use   Vaping Use: Never used  Substance and Sexual Activity   Alcohol use: No    Alcohol/week: 0.0 standard drinks   Drug use: No   Sexual activity: Not Currently    Birth control/protection: None  Other Topics Concern   Not on file  Social History Narrative   ** Merged History Encounter **       ** Merged History Encounter **  [name] lives alone. She is separated/divorced from her second husband and widowed by her first husband. She has twin daughters, Kaitlyn Serrano and Kaitlyn Serrano. Kaitlyn Serrano has been a cause of stress for her and she believes was preven   ting her from getting medical care that she needed. She is no longer talking to [facility]. She states that Kaitlyn Serrano is involved in her care and that she trusts her to have her best  interest in mind.      Social Determinants of Health   Financial Resource Strain: Not on file  Food Insecurity: Not on file  Transportation Needs: Not on file  Physical Activity: Not on file  Stress: Not on file  Social Connections: Not on file  Intimate Partner Violence: Not on file   Family History  Problem Relation Age of Onset   Diabetes Mother    Heart disease Mother  Hypertension Mother    Gallbladder disease Mother    Heart disease Father    Pneumonia Sister    Hypertension Brother    Hyperlipidemia Brother    Gallbladder disease Other    Hypertension Child     Objective: Office vital signs reviewed. BP (!) 150/85   Pulse (!) 58   Temp 98.1 F (36.7 C)   Ht '5\' 2"'  (1.575 m)   Wt 137 lb 9.6 oz (62.4 kg)   SpO2 96%   BMI 25.17 kg/m   Physical Examination:  General: Awake, alert, nontoxic female, No acute distress HEENT: Normal; sclera white.  No goiter Cardio: regular rate and rhythm, S1S2 heard, no murmurs appreciated Pulm: clear to auscultation bilaterally, no wheezes, rhonchi or rales; normal work of breathing on room air Extremities: warm, well perfused, No edema, cyanosis or clubbing; +2 pulses bilaterally Psych: Pleasant, interactive  Depression screen [facility] 2/9 10/24/2020 08/24/2020 04/18/2020  Decreased Interest 3 0 0  Down, Depressed, Hopeless 0 0 1  PHQ - 2 Score 3 0 1  Altered sleeping 0 0 2  Tired, decreased energy 0 0 2  Change in appetite 0 1 0  Feeling bad or failure about yourself  0 0 0  Trouble concentrating 0 0 1  Moving slowly or fidgety/restless 0 0 0  Suicidal thoughts 0 0 0  PHQ-9 Score '3 1 6  ' Difficult doing work/chores Somewhat difficult Not difficult at all Not difficult at all  Some recent data might be hidden   GAD 7 : Generalized Anxiety Score 10/24/2020 04/18/2020 12/17/2019  Nervous, Anxious, on Edge 3 0 0  Control/stop worrying 3 0 0  Worry too much - different things 3 0 0  Trouble relaxing 3 0 0  Restless 3 0 0   Easily annoyed or irritable 1 0 0  Afraid - awful might happen 2 0 0  Total GAD 7 Score 18 0 0    Assessment/ Plan: 80 y.o. female   GAD (generalized anxiety disorder) - Plan: busPIRone (BUSPAR) 10 MG tablet, TSH  Essential hypertension - Plan: diltiazem (TIADYLT ER) 120 MG 24 hr capsule, CMP14+EGFR, Lipid Panel  History of MI (myocardial infarction) - Plan: CMP14+EGFR, Lipid Panel, TSH  Adverse reaction to statin medication  History of bee sting allergy - Plan: EPINEPHrine 0.3 mg/0.3 mL IJ SOAJ injection  Gastroesophageal reflux disease without esophagitis - Plan: pantoprazole (PROTONIX) 40 MG tablet  Anxiety disorder certainly uncontrolled.  Given her concomitant use of pain medications I agree with proceeding with BuSpar over benzodiazepines.  Start at 5 mg twice daily did titrate up to 10 mg twice daily.  Would like to see her back again in 6 weeks for recheck.  We will advance medications or add adjunct of medications at bedtime if needed  Blood pressure is borderline for age.  Would prefer tighter control.  I renewed her diltiazem.  Check nonfasting lipid panel and CMP  Not currently treated with statin due to history of peeling of skin with statins in the past.  I have renewed her EpiPen as this was out of date and she is had history of anaphylaxis to bee stings in the past  PPI renewed.  Use only if needed for GERD.  No orders of the defined types were placed in this encounter.  No orders of the defined types were placed in this encounter.    Kaitlyn Norlander, DO [place] [phone]

## 2020-10-24 NOTE — Patient Instructions (Signed)
You had labs performed today.  You will be contacted with the results of the labs once they are available, usually in the next 3 business days for routine lab work.  If you have an active my chart account, they will be released to your MyChart.  If you prefer to have these labs released to you via telephone, please let us know.  Taking the medicine as directed and not missing any doses is one of the best things you can do to treat your anxiety.  Here are some things to keep in mind:  Side effects (stomach upset, some increased anxiety) may happen before you notice a benefit.  These side effects typically go away over time. Changes to your dose of medicine or a change in medication all together is sometimes necessary Most people need to be on medication at least 12 months Many people will notice an improvement within two weeks but the full effect of the medication can take up to 4-6 weeks Stopping the medication when you start feeling better often results in a return of symptoms Never discontinue your medication without contacting a health care professional first.  Some medications require gradual discontinuation/ taper and can make you sick if you stop them abruptly.  If your symptoms worsen or you have thoughts of suicide/homicide, PLEASE SEEK IMMEDIATE MEDICAL ATTENTION.  You may always call:  National Suicide Hotline: (573) 330-7763 [place]: 604-178-8614 Crisis Recovery in [place]: [phone]   These are available 24 hours a day, 7 days a week.

## 2020-10-25 ENCOUNTER — Other Ambulatory Visit: Payer: Self-pay | Admitting: Family Medicine

## 2020-10-25 ENCOUNTER — Ambulatory Visit
Admission: RE | Admit: 2020-10-25 | Discharge: 2020-10-25 | Disposition: A | Discharge status: Home / Self Care | Payer: Medicare Other | Source: Ambulatory Visit | Attending: Family Medicine | Admitting: Family Medicine

## 2020-10-25 DIAGNOSIS — Z1231 Encounter for screening mammogram for malignant neoplasm of breast: Secondary | ICD-10-CM

## 2020-10-25 LAB — LIPID PANEL
Chol/HDL Ratio: 4.5 ratio — ABNORMAL HIGH (ref 0.0–4.4)
Cholesterol, Total: 241 mg/dL — ABNORMAL HIGH (ref 100–199)
HDL: 53 mg/dL (ref >39)
LDL Chol Calc (NIH): 157 mg/dL — ABNORMAL HIGH (ref 0–99)
Triglycerides: 170 mg/dL — ABNORMAL HIGH (ref 0–149)
VLDL Cholesterol Cal: 31 mg/dL (ref 5–40)

## 2020-10-25 LAB — CMP14+EGFR
ALT: 16 IU/L (ref 0–32)
AST: 17 IU/L (ref 0–40)
Albumin/Globulin Ratio: 1.9 (ref 1.2–2.2)
Albumin: 4.5 g/dL (ref 3.7–4.7)
Alkaline Phosphatase: 98 IU/L (ref 44–121)
BUN/Creatinine Ratio: 34 — ABNORMAL HIGH (ref 12–28)
BUN: 25 mg/dL (ref 8–27)
Bilirubin Total: 0.6 mg/dL (ref 0.0–1.2)
CO2: 24 mmol/L (ref 20–29)
Calcium: 9.4 mg/dL (ref 8.7–10.3)
Chloride: 98 mmol/L (ref 96–106)
Creatinine, Ser: 0.74 mg/dL (ref 0.57–1.00)
Globulin, Total: 2.4 g/dL (ref 1.5–4.5)
Glucose: 90 mg/dL (ref 65–99)
Potassium: 5.2 mmol/L (ref 3.5–5.2)
Sodium: 139 mmol/L (ref 134–144)
Total Protein: 6.9 g/dL (ref 6.0–8.5)
eGFR: 82 mL/min/{1.73_m2} (ref >59)

## 2020-10-25 LAB — TSH: TSH: 0.829 u[IU]/mL (ref 0.450–4.500)

## 2020-11-12 ENCOUNTER — Other Ambulatory Visit: Payer: Self-pay | Admitting: Family Medicine

## 2020-11-12 DIAGNOSIS — F5104 Psychophysiologic insomnia: Secondary | ICD-10-CM

## 2020-11-28 ENCOUNTER — Other Ambulatory Visit: Payer: Self-pay | Admitting: Family Medicine

## 2020-11-28 DIAGNOSIS — F5104 Psychophysiologic insomnia: Secondary | ICD-10-CM

## 2020-11-28 DIAGNOSIS — G894 Chronic pain syndrome: Secondary | ICD-10-CM | POA: Diagnosis not present

## 2020-11-28 DIAGNOSIS — M545 Low back pain, unspecified: Secondary | ICD-10-CM | POA: Diagnosis not present

## 2020-11-28 DIAGNOSIS — M5106 Intervertebral disc disorders with myelopathy, lumbar region: Secondary | ICD-10-CM | POA: Diagnosis not present

## 2020-11-28 DIAGNOSIS — M791 Myalgia, unspecified site: Secondary | ICD-10-CM | POA: Diagnosis not present

## 2020-12-05 ENCOUNTER — Other Ambulatory Visit: Payer: Self-pay

## 2020-12-05 ENCOUNTER — Encounter: Payer: Self-pay | Admitting: Family Medicine

## 2020-12-05 ENCOUNTER — Ambulatory Visit (INDEPENDENT_AMBULATORY_CARE_PROVIDER_SITE_OTHER): Payer: Medicare HMO | Admitting: Family Medicine

## 2020-12-05 ENCOUNTER — Telehealth: Payer: Self-pay

## 2020-12-05 VITALS — BP 153/89 | HR 68 | Temp 98.3°F | Ht 62.0 in | Wt 140.2 lb

## 2020-12-05 DIAGNOSIS — F411 Generalized anxiety disorder: Secondary | ICD-10-CM | POA: Diagnosis not present

## 2020-12-05 DIAGNOSIS — E782 Mixed hyperlipidemia: Secondary | ICD-10-CM

## 2020-12-05 DIAGNOSIS — Z23 Encounter for immunization: Secondary | ICD-10-CM

## 2020-12-05 MED ORDER — BUSPIRONE HCL 15 MG PO TABS
15.0000 mg | ORAL_TABLET | Freq: Two times a day (BID) | ORAL | 3 refills | Status: DC
Start: 2020-12-05 — End: 2021-03-09

## 2020-12-05 NOTE — Telephone Encounter (Signed)
-----   Message from Arnoldo Lenis, MD sent at 12/05/2020  8:24 AM EDT ----- Thx for update, we will refer her to our lipid clinic to see if repatha may be an option  Zandra Abts MD ----- Message ----- From: Janora Norlander, DO Sent: 10/25/2020   7:37 AM EDT To: Arnoldo Lenis, MD, [facility] Clinical Pool  Sugars normal Kidney function normal Slight elevation in her BUN.  She did not report any bleeding but we did start a PPI recently.  We will recheck this at next visit Potassium and other electrolytes are normal Liver enzymes normal  Cholesterol is high.  She has allergy to statins.  I wonder if she is a good candidate for Repatha.  Will CC her cardiologist  Thyroid-stimulating hormone normal

## 2020-12-05 NOTE — Telephone Encounter (Signed)
Referral sent to Lipid Clinic for hyperlipidemia per Dr. Harl Bowie

## 2020-12-05 NOTE — Progress Notes (Signed)
Subjective: CC:GAD PCP: Kaitlyn Norlander, DO Kaitlyn Serrano is a 80 y.o. female presenting to clinic today for:  1. GAD She reports that symptoms have gotten quite a bit better after resuming use of the BuSpar.  She does feel that she may have some more room for improvement however I would like to advance the dose to 15 mg.   ROS: Per HPI  Allergies  Allergen Reactions   Bee Venom Anaphylaxis   Hydrocodone Swelling   Penicillins Anaphylaxis and Rash    Has patient had a PCN reaction causing immediate rash, facial/tongue/throat swelling, SOB or lightheadedness with hypotension: Yes Has patient had a PCN reaction causing severe rash involving mucus membranes or skin necrosis: Yes Has patient had a PCN reaction that required hospitalization: Yes Has patient had a PCN reaction occurring within the last 10 years: No If all of the above answers are "NO", then may proceed with Cephalosporin use.    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 family unaware   Dysphagia    Dyspnea    Generalized weakness    GERD (gastroesophageal reflux disease)    GERD (gastroesophageal reflux disease)    High cholesterol    Hoarseness    Hx of cardiovascular stress test    negative 2012 and 2016   Hypertension    Insomnia    Intervertebral disc disorder with myelopathy, lumbar region    Lumbago    Lumbar spine pain    PONV (postoperative nausea and vomiting)    confusion after anesthesia   Renal insufficiency    Ringing in ears    resolved   Spondylosis with myelopathy, lumbar region    legs, back    Current Outpatient Medications:    acetaminophen (TYLENOL) 500  MG tablet, Take 2 tablets (1,000 mg total) by mouth every 8 (eight) hours., Disp: 30 tablet, Rfl: 0   atenolol (TENORMIN) 25 MG tablet, TAKE 1 TABLET (25 MG TOTAL) BY MOUTH DAILY., Disp: 90 tablet, Rfl: 0   cyclobenzaprine (FLEXERIL) 10 MG tablet, Take 10 mg by mouth as needed for muscle spasms., Disp: , Rfl:    diclofenac (VOLTAREN) 75 MG EC tablet, TAKE 1 TABLET BY MOUTH TWICE DAILY AS NEEDED FOR MUSCLE AND JOINT PAIN, Disp: 180 tablet, Rfl: 0   diltiazem (TIADYLT ER) 120 MG 24 hr capsule, TAKE 1 CAPSULE BY MOUTH EVERY DAY, Disp: 90 capsule, Rfl: 3   docusate sodium (COLACE) 100 MG capsule, Take 1 capsule (100 mg total) by mouth 2 (two) times daily., Disp: 28 capsule, Rfl: 0   FEMRING 0.05 MG/24HR RING, INSERT VAGINALLY AS DIRECTED EVERY 3 MONTHS, Disp: 1 each, Rfl: 2   fluticasone (FLONASE) 50 MCG/ACT nasal spray, Place 2 sprays into both nostrils daily as needed for allergies., Disp: 16 g, Rfl: 12   hydrALAZINE (APRESOLINE) 25 MG tablet, TAKE 1 TABLET BY MOUTH TWICE A DAY, Disp: 180 tablet, Rfl: 0   HYDROcodone-acetaminophen (NORCO) 10-325 MG tablet, Take 1 tablet by mouth as needed., Disp: , Rfl:    lisinopril (ZESTRIL) 40 MG tablet, TAKE 1 TABLET BY MOUTH DAILY, Disp: 90 tablet, Rfl: 3   loratadine (  CLARITIN) 10 MG tablet, TAKE 1 TABLET BY MOUTH EVERY DAY, Disp: 90 tablet, Rfl: 1   metroNIDAZOLE (FLAGYL) 500 MG tablet, Take 1 tablet (500 mg total) by mouth 2 (two) times daily., Disp: 14 tablet, Rfl: 0   pantoprazole (PROTONIX) 40 MG tablet, Take 1 tablet (40 mg total) by mouth daily as needed (acid reflux)., Disp: 90 tablet, Rfl: 3   polyethylene glycol (MIRALAX / GLYCOLAX) 17 g packet, Take 17 g by mouth 2 (two) times daily., Disp: 28 packet, Rfl: 0   tiZANidine (ZANAFLEX) 2 MG tablet, Take 2 mg by mouth 2 (two) times daily., Disp: , Rfl:    busPIRone (BUSPAR) 15 MG tablet, Take 1 tablet (15 mg total) by mouth 2 (two) times daily., Disp: 180 tablet, Rfl: 3 Social History   Socioeconomic  History   Marital status: Divorced    Spouse name: Not on file   Number of children: 2   Years of education: Not on file   Highest education level: Not on file  Occupational History   Not on file  Tobacco Use   Smoking status: Former    Packs/day: 4.00    Years: 3.00    Pack years: 12.00    Types: Cigarettes    Start date: 06/10/1960    Quit date: 02/20/1964    Years since quitting: 56.8   Smokeless tobacco: Never  Vaping Use   Vaping Use: Never used  Substance and Sexual Activity   Alcohol use: No    Alcohol/week: 0.0 standard drinks   Drug use: No   Sexual activity: Not Currently    Birth control/protection: None  Other Topics Concern   Not on file  Social History Narrative   ** Merged History Encounter **       ** Merged History Encounter **  [name] lives alone. She is separated/divorced from her second husband and widowed by her first husband. She has twin daughters, Kaitlyn Serrano and Kaitlyn Serrano. Kaitlyn Serrano has been a cause of stress for her and she believes was preven   ting her from getting medical care that she needed. She is no longer talking to [name]. She states that Kaitlyn Serrano is involved in her care and that she trusts her to have her best interest in mind.      Social Determinants of Health   Financial Resource Strain: Not on file  Food Insecurity: Not on file  Transportation Needs: Not on file  Physical Activity: Not on file  Stress: Not on file  Social Connections: Not on file  Intimate Partner Violence: Not on file   Family History  Problem Relation Age of Onset   Diabetes Mother    Heart disease Mother    Hypertension Mother    Gallbladder disease Mother    Heart disease Father    Pneumonia Sister    Breast cancer Paternal Grandmother    Hypertension Brother    Hyperlipidemia Brother    Hypertension Child    Gallbladder disease Other     Objective: Office vital signs reviewed. BP (!) 153/89   Pulse 68   Temp 98.3 F (36.8 C)   Ht 5\' 2"  (1.575 m)    Wt 140 lb 3.2 oz (63.6 kg)   SpO2 95%   BMI 25.64 kg/m   Physical Examination:  General: Awake, alert, well nourished, No acute distress Cardio: regular rate and rhythm, S1S2 heard, no murmurs appreciated Pulm: clear to auscultation bilaterally, no wheezes, rhonchi or rales; normal work of breathing on room air  Psych: Mood is stable.  Patient very pleasant, interactive.  Thought process is linear.  Depression screen [facility] 2/9 12/05/2020 10/24/2020 08/24/2020  Decreased Interest 3 3 0  Down, Depressed, Hopeless 1 0 0  PHQ - 2 Score 4 3 0  Altered sleeping 0 0 0  Tired, decreased energy 0 0 0  Change in appetite 1 0 1  Feeling bad or failure about yourself  1 0 0  Trouble concentrating 0 0 0  Moving slowly or fidgety/restless 1 0 0  Suicidal thoughts 0 0 0  PHQ-9 Score 7 3 1   Difficult doing work/chores Not difficult at all Somewhat difficult Not difficult at all  Some recent data might be hidden   GAD 7 : Generalized Anxiety Score 12/05/2020 10/24/2020 04/18/2020 12/17/2019  Nervous, Anxious, on Edge 1 3 0 0  Control/stop worrying 1 3 0 0  Worry too much - different things 3 3 0 0  Trouble relaxing 1 3 0 0  Restless 1 3 0 0  Easily annoyed or irritable 2 1 0 0  Afraid - awful might happen 0 2 0 0  Total GAD 7 Score 9 18 0 0  Anxiety Difficulty Somewhat difficult - - -    Assessment/ Plan: 80 y.o. female   GAD (generalized anxiety disorder) - Plan: busPIRone (BUSPAR) 15 MG tablet  Need for immunization against influenza - Plan: Flu Vaccine QUAD High Dose(Fluad)  Patient reports improvement in her depressive and anxiety symptoms.  I am somewhat surprised that her PHQ was higher.  We have advanced her buspirone to 15 mg.  Hopefully we will see an improvement in that as well.  We will plan to reconvene in the next 2 to 3 months, sooner if concerns arise  Influenza vaccination administered.  Orders Placed This Encounter  Procedures   Flu Vaccine QUAD High Dose(Fluad)   Meds  ordered this encounter  Medications   busPIRone (BUSPAR) 15 MG tablet    Sig: Take 1 tablet (15 mg total) by mouth 2 (two) times daily.    Dispense:  180 tablet    Refill:  [place], [place] [id number]

## 2020-12-21 ENCOUNTER — Other Ambulatory Visit: Payer: Self-pay | Admitting: Family Medicine

## 2020-12-28 ENCOUNTER — Other Ambulatory Visit: Payer: Self-pay | Admitting: Cardiology

## 2020-12-28 ENCOUNTER — Other Ambulatory Visit: Payer: Self-pay | Admitting: Family Medicine

## 2020-12-28 DIAGNOSIS — J301 Allergic rhinitis due to pollen: Secondary | ICD-10-CM

## 2020-12-30 ENCOUNTER — Other Ambulatory Visit: Payer: Self-pay | Admitting: Family Medicine

## 2020-12-30 DIAGNOSIS — F5104 Psychophysiologic insomnia: Secondary | ICD-10-CM

## 2021-01-09 DIAGNOSIS — M5106 Intervertebral disc disorders with myelopathy, lumbar region: Secondary | ICD-10-CM | POA: Diagnosis not present

## 2021-01-09 DIAGNOSIS — G894 Chronic pain syndrome: Secondary | ICD-10-CM | POA: Diagnosis not present

## 2021-01-09 DIAGNOSIS — M791 Myalgia, unspecified site: Secondary | ICD-10-CM | POA: Diagnosis not present

## 2021-01-09 DIAGNOSIS — M545 Low back pain, unspecified: Secondary | ICD-10-CM | POA: Diagnosis not present

## 2021-01-09 DIAGNOSIS — I10 Essential (primary) hypertension: Secondary | ICD-10-CM | POA: Diagnosis not present

## 2021-01-09 DIAGNOSIS — Z6822 Body mass index (BMI) 22.0-22.9, adult: Secondary | ICD-10-CM | POA: Diagnosis not present

## 2021-01-16 ENCOUNTER — Other Ambulatory Visit: Payer: Self-pay | Admitting: Family Medicine

## 2021-01-23 ENCOUNTER — Ambulatory Visit (INDEPENDENT_AMBULATORY_CARE_PROVIDER_SITE_OTHER): Payer: Medicare HMO | Admitting: Family Medicine

## 2021-01-23 ENCOUNTER — Other Ambulatory Visit: Payer: Self-pay

## 2021-01-23 ENCOUNTER — Encounter: Payer: Self-pay | Admitting: Family Medicine

## 2021-01-23 VITALS — BP 160/82 | HR 76 | Temp 98.3°F | Ht 62.0 in | Wt 136.2 lb

## 2021-01-23 DIAGNOSIS — I10 Essential (primary) hypertension: Secondary | ICD-10-CM

## 2021-01-23 DIAGNOSIS — F411 Generalized anxiety disorder: Secondary | ICD-10-CM | POA: Diagnosis not present

## 2021-01-23 MED ORDER — LISINOPRIL 40 MG PO TABS: 40.0000 mg | ORAL_TABLET | Freq: Every day | ORAL | 3 refills | Status: DC

## 2021-01-23 MED ORDER — ATENOLOL 25 MG PO TABS: 25.0000 mg | ORAL_TABLET | Freq: Every day | ORAL | 3 refills | Status: DC

## 2021-01-23 NOTE — Progress Notes (Signed)
Subjective: CC: F/u Anxiety PCP: Kaitlyn Norlander, DO [name] is a 80 y.o. female presenting to clinic today for:  1. Anxiety Buspar was increased to 15mg  BID last visit.  She does feel that the increased dose is helpful.  She reports some situational anxiety where there is been some issues with her bank account.  2.  Hypertension Patient notes that she ran out of her medication because her mail order no longer will send it.  Apparently she had canceled a health insurance policy and now they refused to fill anything.  She needs her lisinopril renewed.  Denies any chest pain, shortness of breath, dizziness.   ROS: Per HPI  Allergies  Allergen Reactions   Bee Venom Anaphylaxis   Hydrocodone Swelling   Penicillins Anaphylaxis and Rash    Has patient had a PCN reaction causing immediate rash, facial/tongue/throat swelling, SOB or lightheadedness with hypotension: Yes Has patient had a PCN reaction causing severe rash involving mucus membranes or skin necrosis: Yes Has patient had a PCN reaction that required hospitalization: Yes Has patient had a PCN reaction occurring within the last 10 years: No If all of the above answers are "NO", then may proceed with Cephalosporin use.    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 family unaware   Dysphagia    Dyspnea    Generalized weakness    GERD (gastroesophageal reflux disease)    GERD (gastroesophageal reflux disease)    High cholesterol    Hoarseness    Hx of cardiovascular stress test    negative 2012 and 2016   Hypertension    Insomnia    Intervertebral disc disorder with myelopathy, lumbar region     Lumbago    Lumbar spine pain    PONV (postoperative nausea and vomiting)    confusion after anesthesia   Renal insufficiency    Ringing in ears    resolved   Spondylosis with myelopathy, lumbar region    legs, back    Current Outpatient Medications:    acetaminophen (TYLENOL) 500 MG tablet, Take 2 tablets (1,000 mg total) by mouth every 8 (eight) hours., Disp: 30 tablet, Rfl: 0   atenolol (TENORMIN) 25 MG tablet, TAKE 1 TABLET (25 MG TOTAL) BY MOUTH DAILY., Disp: 90 tablet, Rfl: 0   busPIRone (BUSPAR) 15 MG tablet, Take 1 tablet (15 mg total) by mouth 2 (two) times daily., Disp: 180 tablet, Rfl: 3   cyclobenzaprine (FLEXERIL) 10 MG tablet, Take 10 mg by mouth as needed for muscle spasms., Disp: , Rfl:    diclofenac (VOLTAREN) 75 MG EC tablet, TAKE 1 TABLET BY MOUTH TWICE DAILY AS NEEDED FOR MUSCLE AND JOINT PAIN, Disp: 180 tablet, Rfl: 0   diltiazem (TIADYLT ER) 120 MG 24 hr capsule, TAKE 1 CAPSULE BY MOUTH EVERY DAY, Disp: 90 capsule, Rfl: 3   docusate sodium (COLACE) 100 MG capsule, Take 1 capsule (100 mg total) by mouth 2 (two) times daily., Disp: 28 capsule, Rfl: 0   FEMRING 0.05 MG/24HR RING, INSERT VAGINALLY AS DIRECTED EVERY 3 MONTHS, Disp: 1 each, Rfl: 2   fluticasone (FLONASE) 50 MCG/ACT nasal spray, USE TWO  SPRAYS IN EACH NOSTRIL ONCE DAILY, Disp: 48 mL, Rfl: 1   hydrALAZINE (APRESOLINE) 25 MG tablet, TAKE 1 TABLET BY MOUTH TWICE A DAY, Disp: 180 tablet, Rfl: 0   HYDROcodone-acetaminophen (NORCO) 10-325 MG tablet, Take 1 tablet by mouth as needed., Disp: , Rfl:    lisinopril (ZESTRIL) 40 MG tablet, TAKE 1 TABLET BY MOUTH DAILY, Disp: 90 tablet, Rfl: 3   loratadine (CLARITIN) 10 MG tablet, TAKE 1 TABLET BY MOUTH EVERY DAY, Disp: 90 tablet, Rfl: 1   pantoprazole (PROTONIX) 40 MG tablet, Take 1 tablet (40 mg total) by mouth daily as needed (acid reflux)., Disp: 90 tablet, Rfl: 3   polyethylene glycol (MIRALAX / GLYCOLAX) 17 g packet, Take 17 g by mouth 2 (two) times daily., Disp:  28 packet, Rfl: 0   tiZANidine (ZANAFLEX) 2 MG tablet, Take 2 mg by mouth 2 (two) times daily., Disp: , Rfl:  Social History   Socioeconomic History   Marital status: Divorced    Spouse name: Not on file   Number of children: 2   Years of education: Not on file   Highest education level: Not on file  Occupational History   Not on file  Tobacco Use   Smoking status: Former    Packs/day: 4.00    Years: 3.00    Pack years: 12.00    Types: Cigarettes    Start date: 06/10/1960    Quit date: 02/20/1964    Years since quitting: 56.9   Smokeless tobacco: Never  Vaping Use   Vaping Use: Never used  Substance and Sexual Activity   Alcohol use: No    Alcohol/week: 0.0 standard drinks   Drug use: No   Sexual activity: Not Currently    Birth control/protection: None  Other Topics Concern   Not on file  Social History Narrative   ** Merged History Encounter **       ** Merged History Encounter **  Kaitlyn Serrano lives alone. She is separated/divorced from her second husband and widowed by her first husband. She has twin daughters, Kaitlyn Serrano and Kaitlyn Serrano. Kaitlyn Serrano has been a cause of stress for her and she believes was preven   ting her from getting medical care that she needed. She is no longer talking to Kaitlyn Serrano. She states that Kaitlyn Serrano is involved in her care and that she trusts her to have her best interest in mind.      Social Determinants of Health   Financial Resource Strain: Not on file  Food Insecurity: Not on file  Transportation Needs: Not on file  Physical Activity: Not on file  Stress: Not on file  Social Connections: Not on file  Intimate Partner Violence: Not on file   Family History  Problem Relation Age of Onset   Diabetes Mother    Heart disease Mother    Hypertension Mother    Gallbladder disease Mother    Heart disease Father    Pneumonia Sister    Breast cancer Paternal Grandmother    Hypertension Brother    Hyperlipidemia Brother    Hypertension Child     Gallbladder disease Other     Objective: Office vital signs reviewed. BP (!) 160/82   Pulse 76   Temp 98.3 F (36.8 C)   Ht 5\' 2"  (1.575 m)   Wt 136 lb 3.2 oz (61.8 kg)   SpO2 96%   BMI 24.91 kg/m   Physical Examination:  General: Awake, alert, No acute distress HEENT: Normal; sclera white Cardio: regular rate  and rhythm, S1S2 heard, no murmurs appreciated Pulm: clear to auscultation bilaterally, no wheezes, rhonchi or rales; normal work of breathing on room air MSK: Ambulating independently Psych: Some paranoid thought.  Depression screen [facility] 2/9 01/23/2021 12/05/2020 10/24/2020  Decreased Interest 3 3 3   Down, Depressed, Hopeless 2 1 0  PHQ - 2 Score 5 4 3   Altered sleeping 0 0 0  Tired, decreased energy 0 0 0  Change in appetite 0 1 0  Feeling bad or failure about yourself  0 1 0  Trouble concentrating 0 0 0  Moving slowly or fidgety/restless 0 1 0  Suicidal thoughts 0 0 0  PHQ-9 Score 5 7 3   Difficult doing work/chores Not difficult at all Not difficult at all Somewhat difficult  Some recent data might be hidden   GAD 7 : Generalized Anxiety Score 01/23/2021 12/05/2020 10/24/2020 04/18/2020  Nervous, Anxious, on Edge 1 1 3  0  Control/stop worrying 1 1 3  0  Worry too much - different things 1 3 3  0  Trouble relaxing 1 1 3  0  Restless 0 1 3 0  Easily annoyed or irritable 0 2 1 0  Afraid - awful might happen 0 0 2 0  Total GAD 7 Score 4 9 18  0  Anxiety Difficulty Somewhat difficult Somewhat difficult - -   Assessment/ Plan: 80 y.o. female   GAD (generalized anxiety disorder)  Essential hypertension - Plan: atenolol (TENORMIN) 25 MG tablet, lisinopril (ZESTRIL) 40 MG tablet  Anxiety under better control but she is having some situational exacerbation.  We will reconvene again in February for recheck.  If needed, may advance to 20 mg during that visit  Blood pressure not at goal but she apparently has run out of her lisinopril, which is not being sent from her  mail order pharmacy.  I renewed this medication.  No orders of the defined types were placed in this encounter.  No orders of the defined types were placed in this encounter.    Kaitlyn Norlander, DO [place] [phone]

## 2021-01-24 ENCOUNTER — Other Ambulatory Visit: Payer: Self-pay | Admitting: Family Medicine

## 2021-01-24 DIAGNOSIS — F411 Generalized anxiety disorder: Secondary | ICD-10-CM

## 2021-02-06 ENCOUNTER — Ambulatory Visit: Payer: Medicare HMO | Admitting: Family Medicine

## 2021-02-06 ENCOUNTER — Encounter: Payer: Self-pay | Admitting: Family Medicine

## 2021-02-12 ENCOUNTER — Other Ambulatory Visit: Payer: Self-pay | Admitting: Family Medicine

## 2021-02-12 DIAGNOSIS — F411 Generalized anxiety disorder: Secondary | ICD-10-CM

## 2021-02-13 ENCOUNTER — Other Ambulatory Visit: Payer: Self-pay | Admitting: Family Medicine

## 2021-02-14 ENCOUNTER — Other Ambulatory Visit: Payer: Self-pay | Admitting: Family Medicine

## 2021-02-14 DIAGNOSIS — F5104 Psychophysiologic insomnia: Secondary | ICD-10-CM

## 2021-02-15 ENCOUNTER — Other Ambulatory Visit: Payer: Self-pay | Admitting: Family Medicine

## 2021-02-15 ENCOUNTER — Telehealth: Payer: Self-pay | Admitting: Family Medicine

## 2021-02-15 DIAGNOSIS — F411 Generalized anxiety disorder: Secondary | ICD-10-CM

## 2021-02-15 NOTE — Telephone Encounter (Signed)
I spoke to pt and she was requesting her Buspar be sent to [facility] and I advised pt we did send it 12/05/20 for 90 day supply with 3 refills. Pt states she will contact [place].

## 2021-02-19 ENCOUNTER — Other Ambulatory Visit: Payer: Self-pay | Admitting: Family Medicine

## 2021-02-19 DIAGNOSIS — F5104 Psychophysiologic insomnia: Secondary | ICD-10-CM

## 2021-02-20 ENCOUNTER — Telehealth: Payer: Self-pay | Admitting: Family Medicine

## 2021-02-20 NOTE — Telephone Encounter (Signed)
Medication not on current med list. This was [place] on 06/08/20 at cardiology appt, then TC here on 06/14/20 we let pt know that it was refill in Feb for #90 with 3 refills. TC to [facility] they filled it in Feb, then April and July. Our dispense report in Epic shows that it was filled in Aug by [facility], [facility]. There is also [facility] that came in electronically. Please advise

## 2021-02-20 NOTE — Telephone Encounter (Signed)
°  Prescription Request  02/20/2021  Is this a "Controlled Substance" medicine? no  Have you seen your PCP in the last 2 weeks? no  If YES, route message to pool  -  If NO, patient needs to be scheduled for appointment.  What is the name of the medication or equipment? Trazodone   Have you contacted your pharmacy to request a refill? yes   Which [facility] [place]    Patient notified that their request is being sent to the clinical staff for review and that they should receive a response within 2 business days.

## 2021-02-20 NOTE — Telephone Encounter (Signed)
I was under the impression that she was also off of the trazodone.  I thought that this was discontinued due to cardiac history and its association with increased risk of heart rhythm issues.

## 2021-02-21 ENCOUNTER — Ambulatory Visit: Payer: Self-pay | Admitting: Cardiology

## 2021-02-21 ENCOUNTER — Other Ambulatory Visit: Payer: Self-pay | Admitting: Family Medicine

## 2021-02-21 ENCOUNTER — Encounter: Payer: Self-pay | Admitting: Cardiology

## 2021-02-21 MED ORDER — TRAZODONE HCL 50 MG PO TABS: 25.0000 mg | ORAL_TABLET | Freq: Every evening | ORAL | 3 refills | Status: DC | PRN

## 2021-02-21 NOTE — Telephone Encounter (Signed)
Sounds like patient understands and accepts full responsibility for any adverse events associated with Trazodone, I have renewed rx.  She should use with caution.

## 2021-02-21 NOTE — Telephone Encounter (Signed)
Called and spoke with patient advised of what you said. She states she will have heart problems if she does not take that because she can not go with out sleep. States she never stopped taking and just received some in the mail yesterday.

## 2021-02-21 NOTE — Telephone Encounter (Signed)
Patient aware and verbalized understanding. °

## 2021-02-21 NOTE — Progress Notes (Deleted)
Clinical Summary Kaitlyn Serrano is a 80 y.o.female  1. HTN - recent ER visit 09/2018 with high bp's. SBPs to 180s and chest pain. Chest pain resolved with bp control, discharged from ER. Trops neg. No recurrence of chest pain since that ER visit.    recent issues with high bp, we increased her lisionpril to 20 mg daily   - bps are running 130s/80s, taking lisinopril 20mg  daily.      2. Palpitations - seen in ER 11/2015 with palpitations. Telemetry monitor throughtout visit showed NSR per ER notes. CT PE negative, negative evaluation for ACS  she has been started on diltiazem by another provider, remains on atenolol   1. Leg swelling - echo  showed LVEF 92-33%, grade I diastolic dysfunction.  - improved after stopping norvasc.    - no recent edema     2 Chest pain - previous workup in 2012 per old [place] notes with negative myoview for ischemia. Echo showed normal LV function - 05/2014 completed exercise MPI. Low risk [facility] treadmill score of 6, no ischemia on imaging.   - no recent chest pain. No SOB/DOE - highest level of activity is doing housework/vacuuming, tolerates without troubles - can walk up flight of stairs without significant symptoms.     5. 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 family unaware   Dysphagia    Dyspnea    Generalized weakness    GERD (gastroesophageal reflux disease)    GERD (gastroesophageal reflux disease)    High cholesterol    Hoarseness    Hx of cardiovascular stress test    negative 2012 and 2016   Hypertension    Insomnia    Intervertebral disc disorder with myelopathy, lumbar region    Lumbago    Lumbar spine pain    PONV (postoperative nausea and vomiting)    confusion after anesthesia   Renal  insufficiency    Ringing in ears    resolved   Spondylosis with myelopathy, lumbar region    legs, back     Allergies  Allergen Reactions   Bee Venom Anaphylaxis   Hydrocodone Swelling   Penicillins Anaphylaxis and Rash    Has patient had a PCN reaction causing immediate rash, facial/tongue/throat swelling, SOB or lightheadedness with hypotension: Yes Has patient had a PCN reaction causing severe rash involving mucus membranes or skin necrosis: Yes Has patient had a PCN reaction that required hospitalization: Yes Has patient had a PCN reaction occurring within the last 10 years: No If all of the above answers are "NO", then may proceed with Cephalosporin use.    Amlodipine Swelling   Elavil [Amitriptyline Hcl] Other (See Comments)    Confusion, hallucinations   Statins Swelling and Other (See Comments)    Peeling of skin     Current Outpatient Medications  Medication Sig Dispense Refill   acetaminophen (TYLENOL) 500 MG tablet Take 2 tablets (1,000 mg total) by mouth every 8 (eight) hours. 30 tablet 0   atenolol (TENORMIN) 25 MG tablet Take 1 tablet (25 mg total) by mouth daily. 90 tablet 3   busPIRone (BUSPAR) 15 MG tablet Take 1 tablet (15 mg total) by mouth 2 (two) times daily. 180 tablet 3   cyclobenzaprine (FLEXERIL) 10 MG tablet  Take 10 mg by mouth as needed for muscle spasms.     diclofenac (VOLTAREN) 75 MG EC tablet TAKE 1 TABLET BY MOUTH TWICE DAILY AS NEEDED FOR MUSCLE AND JOINT PAIN 180 tablet 0   diltiazem (TIADYLT ER) 120 MG 24 hr capsule TAKE 1 CAPSULE BY MOUTH EVERY DAY 90 capsule 3   docusate sodium (COLACE) 100 MG capsule Take 1 capsule (100 mg total) by mouth 2 (two) times daily. 28 capsule 0   FEMRING 0.05 MG/24HR RING INSERT VAGINALLY AS DIRECTED EVERY 3 MONTHS 1 each 2   fluticasone (FLONASE) 50 MCG/ACT nasal spray USE TWO SPRAYS IN EACH NOSTRIL ONCE DAILY 48 mL 1   hydrALAZINE (APRESOLINE) 25 MG tablet TAKE 1 TABLET BY MOUTH TWICE A DAY 180 tablet 0    HYDROcodone-acetaminophen (NORCO) 10-325 MG tablet Take 1 tablet by mouth as needed.     lisinopril (ZESTRIL) 40 MG tablet Take 1 tablet (40 mg total) by mouth daily. 90 tablet 3   loratadine (CLARITIN) 10 MG tablet TAKE 1 TABLET BY MOUTH EVERY DAY 90 tablet 1   pantoprazole (PROTONIX) 40 MG tablet Take 1 tablet (40 mg total) by mouth daily as needed (acid reflux). 90 tablet 3   polyethylene glycol (MIRALAX / GLYCOLAX) 17 g packet Take 17 g by mouth 2 (two) times daily. 28 packet 0   tiZANidine (ZANAFLEX) 2 MG tablet Take 2 mg by mouth 2 (two) times daily.     No current facility-administered medications for this visit.     Past Surgical History:  Procedure Laterality Date   ABDOMINAL HYSTERECTOMY     BACK SURGERY     BONE MARROW ASPIRATION     CERVICAL SPINE SURGERY     EYE SURGERY Bilateral    Lasic   FIXATION KYPHOPLASTY     LAMINOTOMY     SPINAL FUSION     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   Procedure: TOTAL HIP ARTHROPLASTY ANTERIOR APPROACH;  Surgeon: Paralee Cancel, MD;  Location: [facility];  Service: Orthopedics;  Laterality: Right;  70 mins     Allergies  Allergen Reactions   Bee Venom Anaphylaxis   Hydrocodone Swelling   Penicillins Anaphylaxis and Rash    Has patient had a PCN reaction causing immediate rash, facial/tongue/throat swelling, SOB or lightheadedness with hypotension: Yes Has patient had a PCN reaction causing severe rash involving mucus membranes or skin necrosis: Yes Has patient had a PCN reaction that required hospitalization: Yes Has patient had a PCN reaction occurring within the last 10 years: No If all of the above answers are "NO", then may proceed with Cephalosporin use.    Amlodipine Swelling   Elavil [Amitriptyline Hcl] Other (See Comments)    Confusion, hallucinations   Statins Swelling and Other (See Comments)    Peeling of skin      Family History  Problem Relation Age of Onset   Diabetes Mother    Heart disease Mother     Hypertension Mother    Gallbladder disease Mother    Heart disease Father    Pneumonia Sister    Breast cancer Paternal Grandmother    Hypertension Brother    Hyperlipidemia Brother    Hypertension Child    Gallbladder disease Other      Social History Ms. Machi reports that she quit smoking about 57 years ago. Her smoking use included cigarettes. She started smoking about 60 years ago. She has a 12.00 pack-year smoking history. She has never used smokeless tobacco. Ms.  Bruski reports no history of alcohol use.   Review of Systems CONSTITUTIONAL: No weight loss, fever, chills, weakness or fatigue.  HEENT: Eyes: No visual loss, blurred vision, double vision or yellow sclerae.No hearing loss, sneezing, congestion, runny nose or sore throat.  SKIN: No rash or itching.  CARDIOVASCULAR:  RESPIRATORY: No shortness of breath, cough or sputum.  GASTROINTESTINAL: No anorexia, nausea, vomiting or diarrhea. No abdominal pain or blood.  GENITOURINARY: No burning on urination, no polyuria NEUROLOGICAL: No headache, dizziness, syncope, paralysis, ataxia, numbness or tingling in the extremities. No change in bowel or bladder control.  MUSCULOSKELETAL: No muscle, back pain, joint pain or stiffness.  LYMPHATICS: No enlarged nodes. No history of splenectomy.  PSYCHIATRIC: No history of depression or anxiety.  ENDOCRINOLOGIC: No reports of sweating, cold or heat intolerance. No polyuria or polydipsia.  Marland Kitchen   Physical Examination There were no vitals filed for this visit. There were no vitals filed for this visit.  Gen: resting comfortably, no acute distress HEENT: no scleral icterus, pupils equal round and reactive, no palptable cervical adenopathy,  CV Resp: Clear to auscultation bilaterally GI: abdomen is soft, non-tender, non-distended, normal bowel sounds, no hepatosplenomegaly MSK: extremities are warm, no edema.  Skin: warm, no rash Neuro:  no focal deficits Psych: appropriate  affect   Diagnostic Studies Echo 04/2010 [facility] LVEF >44%, grade I diastolic dysfunction, mild MR   04/2010 ExerciseMyoview No ischemia. Exercised 8 minutes, 9 METs, 95% THR.   12/2007 Renal artery [place] No stenosis, normal kidney size.    06/01/13 Clinic EKG NSR, LAD   05/2014 Exercise MPI IMPRESSION: 1. No reversible ischemia or infarction.   2. Normal left ventricular wall motion.   3. Left ventricular ejection fraction 57%   4. Low-risk stress test findings*.     08/2014 Echo Study Conclusions  - Left ventricle: The cavity size was normal. Wall thickness was   normal. Systolic function was normal. The estimated ejection   fraction was in the range of 60% to 65%. Wall motion was normal;   there were no regional wall motion abnormalities. Doppler   parameters are consistent with abnormal left ventricular   relaxation (grade 1 diastolic dysfunction). - Aortic valve: Mildly calcified annulus. Trileaflet. - Mitral valve: Mildly calcified annulus. Mildly calcified leaflets   . There was mild regurgitation. - Tricuspid valve: There was mild regurgitation.    Assessment and Plan  1. HTN - appears to be controlled since recent medication changs, continue to monitor - check BMET with lisinopril change   2. Palpitations - no recent symptoms, conitnue to monitor   1. Leg swelling - resolved, looks to have been related to norvasc though she is on low dose diuretic - continue to monitor.    2. Chest pain - several year history, negative stress testing in 2012 and 2016 - no recent symptoms, continue to monitor   Arnoldo Lenis, M.D.

## 2021-02-26 DIAGNOSIS — R52 Pain, unspecified: Secondary | ICD-10-CM | POA: Diagnosis not present

## 2021-02-26 DIAGNOSIS — J029 Acute pharyngitis, unspecified: Secondary | ICD-10-CM | POA: Diagnosis not present

## 2021-02-26 DIAGNOSIS — R059 Cough, unspecified: Secondary | ICD-10-CM | POA: Diagnosis not present

## 2021-02-27 ENCOUNTER — Telehealth: Payer: Self-pay | Admitting: Family Medicine

## 2021-02-27 DIAGNOSIS — M5106 Intervertebral disc disorders with myelopathy, lumbar region: Secondary | ICD-10-CM | POA: Diagnosis not present

## 2021-02-27 DIAGNOSIS — M791 Myalgia, unspecified site: Secondary | ICD-10-CM | POA: Diagnosis not present

## 2021-02-27 DIAGNOSIS — M7062 Trochanteric bursitis, left hip: Secondary | ICD-10-CM | POA: Diagnosis not present

## 2021-02-27 DIAGNOSIS — G894 Chronic pain syndrome: Secondary | ICD-10-CM | POA: Diagnosis not present

## 2021-02-27 NOTE — Telephone Encounter (Signed)
°  No answer unable to leave a message for patient to call back and schedule Medicare Annual Wellness Visit (AWV) to be completed by video or phone. ° °No hx of AWV eligible for AWVI as of 02/25/2009 per [facility]  ° °Please schedule at anytime with [facility] Nurse Health Advisor --- Mary Jane ° °45 Minutes appointment  ° °Any questions, please call me at [phone]   °

## 2021-03-07 NOTE — Telephone Encounter (Signed)
Will close encounter

## 2021-03-09 ENCOUNTER — Ambulatory Visit (INDEPENDENT_AMBULATORY_CARE_PROVIDER_SITE_OTHER): Payer: Medicare HMO | Admitting: Family Medicine

## 2021-03-09 VITALS — BP 113/64 | HR 67 | Temp 97.3°F | Ht 62.0 in | Wt 130.0 lb

## 2021-03-09 DIAGNOSIS — R69 Illness, unspecified: Secondary | ICD-10-CM | POA: Diagnosis not present

## 2021-03-09 DIAGNOSIS — F411 Generalized anxiety disorder: Secondary | ICD-10-CM | POA: Diagnosis not present

## 2021-03-09 DIAGNOSIS — I10 Essential (primary) hypertension: Secondary | ICD-10-CM

## 2021-03-09 DIAGNOSIS — Z23 Encounter for immunization: Secondary | ICD-10-CM

## 2021-03-09 MED ORDER — BUSPIRONE HCL 15 MG PO TABS
15.0000 mg | ORAL_TABLET | Freq: Three times a day (TID) | ORAL | 3 refills | Status: DC
Start: 2021-03-09 — End: 2021-04-16

## 2021-03-09 NOTE — Patient Instructions (Signed)
You have refills on everything now.  Just call the pharmacy when you are due for your other medications.

## 2021-03-09 NOTE — Progress Notes (Signed)
Subjective: CC:HTN, GAD PCP: Janora Norlander, DO PJA:[id number] Kaitlyn Serrano is a 81 y.o. female who is accompanied today's visit by her daughter Kaitlyn Serrano.  She is presenting to clinic today for:  1. GAD To her last visit, patient had reported some situational exacerbation of her anxiety but overall she felt it to be under better control with the BuSpar 15 mg twice daily.  She is here for interval checkup.  She does report that she continues to have some situational anxiety.  Would like to go up to 3 times daily dosing of the BuSpar if possible.  2. HTN Blood pressure noted to be uncontrolled last visit but she had not taken her blood pressure medication.  She has been compliant since that time and blood pressures have been relatively well controlled.  They go up if she gets upset but otherwise she is not had any issues.  No chest pain, shortness of breath or dizziness reported   ROS: Per HPI  Allergies  Allergen Reactions   Bee Venom Anaphylaxis   Hydrocodone Swelling   Penicillins Anaphylaxis and Rash    Has patient had a PCN reaction causing immediate rash, facial/tongue/throat swelling, SOB or lightheadedness with hypotension: Yes Has patient had a PCN reaction causing severe rash involving mucus membranes or skin necrosis: Yes Has patient had a PCN reaction that required hospitalization: Yes Has patient had a PCN reaction occurring within the last 10 years: No If all of the above answers are "NO", then may proceed with Cephalosporin use.    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 family unaware   Dysphagia    Dyspnea    Generalized weakness    GERD  (gastroesophageal reflux disease)    GERD (gastroesophageal reflux disease)    High cholesterol    Hoarseness    Hx of cardiovascular stress test    negative 2012 and 2016   Hypertension    Insomnia    Intervertebral disc disorder with myelopathy, lumbar region    Lumbago    Lumbar spine pain    PONV (postoperative nausea and vomiting)    confusion after anesthesia   Renal insufficiency    Ringing in ears    resolved   Spondylosis with myelopathy, lumbar region    legs, back    Current Outpatient Medications:    acetaminophen (TYLENOL) 500 MG tablet, Take 2 tablets (1,000 mg total) by mouth every 8 (eight) hours., Disp: 30 tablet, Rfl: 0   atenolol (TENORMIN) 25 MG tablet, Take 1 tablet (25 mg total) by mouth daily., Disp: 90 tablet, Rfl: 3   busPIRone (BUSPAR) 15 MG tablet, Take 1 tablet (15 mg total) by mouth 2 (two) times daily., Disp: 180 tablet, Rfl: 3   cyclobenzaprine (FLEXERIL) 10 MG tablet, Take 10 mg by mouth as needed for muscle spasms., Disp: , Rfl:    diclofenac (VOLTAREN) 75 MG EC tablet, TAKE 1 TABLET BY MOUTH TWICE DAILY AS NEEDED FOR MUSCLE AND JOINT PAIN, Disp: 180 tablet, Rfl: 0   diltiazem (TIADYLT ER) 120 MG 24 hr capsule, TAKE 1 CAPSULE BY MOUTH EVERY DAY, Disp: 90 capsule, Rfl: 3  docusate sodium (COLACE) 100 MG capsule, Take 1 capsule (100 mg total) by mouth 2 (two) times daily., Disp: 28 capsule, Rfl: 0   FEMRING 0.05 MG/24HR RING, INSERT VAGINALLY AS DIRECTED EVERY 3 MONTHS, Disp: 1 each, Rfl: 2   fluticasone (FLONASE) 50 MCG/ACT nasal spray, USE TWO SPRAYS IN EACH NOSTRIL ONCE DAILY, Disp: 48 mL, Rfl: 1   hydrALAZINE (APRESOLINE) 25 MG tablet, TAKE 1 TABLET BY MOUTH TWICE A DAY, Disp: 180 tablet, Rfl: 0   HYDROcodone-acetaminophen (NORCO) 10-325 MG tablet, Take 1 tablet by mouth as needed., Disp: , Rfl:    lisinopril (ZESTRIL) 40 MG tablet, Take 1 tablet (40 mg total) by mouth daily., Disp: 90 tablet, Rfl: 3   loratadine (CLARITIN) 10 MG tablet, TAKE 1  TABLET BY MOUTH EVERY DAY, Disp: 90 tablet, Rfl: 1   pantoprazole (PROTONIX) 40 MG tablet, Take 1 tablet (40 mg total) by mouth daily as needed (acid reflux)., Disp: 90 tablet, Rfl: 3   polyethylene glycol (MIRALAX / GLYCOLAX) 17 g packet, Take 17 g by mouth 2 (two) times daily., Disp: 28 packet, Rfl: 0   tiZANidine (ZANAFLEX) 2 MG tablet, Take 2 mg by mouth 2 (two) times daily., Disp: , Rfl:    traZODone (DESYREL) 50 MG tablet, Take 0.5-1.5 tablets (25-75 mg total) by mouth at bedtime as needed for sleep., Disp: 135 tablet, Rfl: 3 Social History   Socioeconomic History   Marital status: Divorced    Spouse name: Not on file   Number of children: 2   Years of education: Not on file   Highest education level: Not on file  Occupational History   Not on file  Tobacco Use   Smoking status: Former    Packs/day: 4.00    Years: 3.00    Pack years: 12.00    Types: Cigarettes    Start date: 06/10/1960    Quit date: 02/20/1964    Years since quitting: 57.0   Smokeless tobacco: Never  Vaping Use   Vaping Use: Never used  Substance and Sexual Activity   Alcohol use: No    Alcohol/week: 0.0 standard drinks   Drug use: No   Sexual activity: Not Currently    Birth control/protection: None  Other Topics Concern   Not on file  Social History Narrative   ** Merged History Encounter **       ** Merged History Encounter **  [name] lives alone. She is separated/divorced from her second husband and widowed by her first husband. She has twin daughters, Kaitlyn Serrano and Kaitlyn Serrano. Kaitlyn Serrano has been a cause of stress for her and she believes was preven   ting her from getting medical care that she needed. She is no longer talking to [place]. She states that Kaitlyn Serrano is involved in her care and that she trusts her to have her best interest in mind.      Social Determinants of Health   Financial Resource Strain: Not on file  Food Insecurity: Not on file  Transportation Needs: Not on file  Physical  Activity: Not on file  Stress: Not on file  Social Connections: Not on file  Intimate Partner Violence: Not on file   Family History  Problem Relation Age of Onset   Diabetes Mother    Heart disease Mother    Hypertension Mother    Gallbladder disease Mother    Heart disease Father    Pneumonia Sister    Breast cancer Paternal Grandmother    Hypertension Brother  Hyperlipidemia Brother    Hypertension Child    Gallbladder disease Other     Objective: Office vital signs reviewed. BP 113/64    Pulse 67    Temp (!) 97.3 F (36.3 C) (Temporal)    Ht 5\' 2"  (1.575 m)    Wt 130 lb (59 kg)    SpO2 95%    BMI 23.78 kg/m   Physical Examination:  General: Awake, alert, well-appearing elderly female, No acute distress HEENT: Sclera white Cardio: regular rate and rhythm with occasional skipped beat, S1S2 heard, no murmurs appreciated Pulm: clear to auscultation bilaterally, no wheezes, rhonchi or rales; normal work of breathing on room air Psych: Mood stable.  Patient is pleasant, interactive.  She does hyperfocus on an insurance policy that she did not order today  Depression screen [facility] 2/9 03/09/2021 01/23/2021 12/05/2020  Decreased Interest 0 3 3  Down, Depressed, Hopeless 0 2 1  PHQ - 2 Score 0 5 4  Altered sleeping - 0 0  Tired, decreased energy - 0 0  Change in appetite - 0 1  Feeling bad or failure about yourself  - 0 1  Trouble concentrating - 0 0  Moving slowly or fidgety/restless - 0 1  Suicidal thoughts - 0 0  PHQ-9 Score - 5 7  Difficult doing work/chores - Not difficult at all Not difficult at all  Some recent data might be hidden   GAD 7 : Generalized Anxiety Score 01/23/2021 12/05/2020 10/24/2020 04/18/2020  Nervous, Anxious, on Edge 1 1 3  0  Control/stop worrying 1 1 3  0  Worry too much - different things 1 3 3  0  Trouble relaxing 1 1 3  0  Restless 0 1 3 0  Easily annoyed or irritable 0 2 1 0  Afraid - awful might happen 0 0 2 0  Total GAD 7 Score 4 9 18  0   Anxiety Difficulty Somewhat difficult Somewhat difficult - -    Assessment/ Plan: 81 y.o. female   GAD (generalized anxiety disorder) - Plan: busPIRone (BUSPAR) 15 MG tablet  Essential hypertension  Anxiety disorder still is situationally exacerbated.  Okay to increase to 3 times daily dosing.  New Rx has been sent  Blood pressure now under good control.  Continue current regimen.  Refills good through the fall.  She may follow-up in 6 months for annual physical exam with fasting labs, sooner if concerns arise  No orders of the defined types were placed in this encounter.  No orders of the defined types were placed in this encounter.    Janora Norlander, DO [place] [phone]

## 2021-03-21 ENCOUNTER — Telehealth: Payer: Self-pay | Admitting: Family Medicine

## 2021-03-21 NOTE — Telephone Encounter (Signed)
°  Left message for patient to call back and schedule Medicare Annual Wellness Visit (AWV) to be completed by video or phone.  No hx of AWV eligible for AWVI as of 02/25/2009 per [employer]  Please schedule at anytime with [place] --- Karle Starch  45 Minutes appointment   Any questions, please call me at [phone]

## 2021-03-28 ENCOUNTER — Ambulatory Visit (INDEPENDENT_AMBULATORY_CARE_PROVIDER_SITE_OTHER): Payer: Medicare HMO

## 2021-03-28 VITALS — Ht 62.0 in | Wt 130.0 lb

## 2021-03-28 DIAGNOSIS — Z0001 Encounter for general adult medical examination with abnormal findings: Secondary | ICD-10-CM

## 2021-03-28 DIAGNOSIS — Z780 Asymptomatic menopausal state: Secondary | ICD-10-CM

## 2021-03-28 DIAGNOSIS — Z9181 History of falling: Secondary | ICD-10-CM

## 2021-03-28 DIAGNOSIS — Z0000 Encounter for general adult medical examination without abnormal findings: Secondary | ICD-10-CM

## 2021-03-28 NOTE — Progress Notes (Signed)
Subjective:   Kaitlyn Serrano is a 81 y.o. female who presents for an Initial Medicare Annual Wellness Visit. Virtual Visit via Telephone Note  I connected with  Kaitlyn Serrano on 03/28/21 at 11:15 AM EST by telephone and verified that I am speaking with the correct person using two identifiers.  Location: Patient: HOME Provider: [facility] Persons participating in the virtual visit: patient/Nurse Health Advisor   I discussed the limitations, risks, security and privacy concerns of performing an evaluation and management service by telephone and the availability of in person appointments. The patient expressed understanding and agreed to proceed.  Interactive audio and video telecommunications were attempted between this nurse and patient, however failed, due to patient having technical difficulties OR patient did not have access to video capability.  We continued and completed visit with audio only.  Some vital signs may be absent or patient reported.   Chriss Driver, LPN  Review of Systems     Cardiac Risk Factors include: advanced age (>61men, >15 women);hypertension;dyslipidemia;sedentary lifestyle  PHONE VISIT. PT AT  HOME. NURSE AT [facility].    Objective:    Today's Vitals   03/28/21 1129  Weight: 130 lb (59 kg)  Height: 5\' 2"  (1.575 m)   Body mass index is 23.78 kg/m.  Advanced Directives 03/28/2021 06/08/2019 06/04/2019 08/05/2018 04/17/2017 11/24/2016 12/09/2015  Does Patient Have a Medical Advance Directive? No No No No No No Yes  Type of Advance Directive - - - - - - Living will  Would patient like information on creating a medical advance directive? No - Patient declined No - Patient declined - No - Patient declined No - Patient declined No - Patient declined -    Current Medications (verified) Outpatient Encounter Medications as of 03/28/2021  Medication Sig   acetaminophen (TYLENOL) 500 MG tablet Take 2 tablets (1,000 mg total) by mouth every 8 (eight) hours.    albuterol (VENTOLIN HFA) 108 (90 Base) MCG/ACT inhaler albuterol sulfate HFA 90 mcg/actuation aerosol inhaler   atenolol (TENORMIN) 25 MG tablet Take 1 tablet (25 mg total) by mouth daily.   busPIRone (BUSPAR) 15 MG tablet Take 1 tablet (15 mg total) by mouth 3 (three) times daily.   cyclobenzaprine (FLEXERIL) 10 MG tablet Take 10 mg by mouth as needed for muscle spasms.   diclofenac (VOLTAREN) 75 MG EC tablet TAKE 1 TABLET BY MOUTH TWICE DAILY AS NEEDED FOR MUSCLE AND JOINT PAIN   diltiazem (TIADYLT ER) 120 MG 24 hr capsule TAKE 1 CAPSULE BY MOUTH EVERY DAY   docusate sodium (COLACE) 100 MG capsule Take 1 capsule (100 mg total) by mouth 2 (two) times daily.   FEMRING 0.05 MG/24HR RING INSERT VAGINALLY AS DIRECTED EVERY 3 MONTHS   fluticasone (FLONASE) 50 MCG/ACT nasal spray USE TWO SPRAYS IN EACH NOSTRIL ONCE DAILY   hydrALAZINE (APRESOLINE) 25 MG tablet TAKE 1 TABLET BY MOUTH TWICE A DAY   HYDROcodone-acetaminophen (NORCO) 10-325 MG tablet Take 1 tablet by mouth as needed.   ipratropium (ATROVENT) 0.06 % nasal spray ipratropium bromide 42 mcg (0.06 %) nasal spray   lisinopril (ZESTRIL) 40 MG tablet Take 1 tablet (40 mg total) by mouth daily.   loratadine (CLARITIN) 10 MG tablet TAKE 1 TABLET BY MOUTH EVERY DAY   pantoprazole (PROTONIX) 40 MG tablet Take 1 tablet (40 mg total) by mouth daily as needed (acid reflux).   polyethylene glycol (MIRALAX / GLYCOLAX) 17 g packet Take 17 g by mouth 2 (two) times daily.  SHINGRIX injection    tiZANidine (ZANAFLEX) 2 MG tablet Take 2 mg by mouth 2 (two) times daily.   traZODone (DESYREL) 50 MG tablet Take 0.5-1.5 tablets (25-75 mg total) by mouth at bedtime as needed for sleep.   busPIRone (BUSPAR) 10 MG tablet buspirone 10 mg tablet  TAKE 1/2 TABLET BY MOUTH TWICE DAILY FOR 3 DAYS, THEN TAKE 1 TABLET TWICE DAILY (Patient not taking: Reported on 03/28/2021)   lisinopril (ZESTRIL) 20 MG tablet lisinopril 20 mg tablet (Patient not taking: Reported on  03/28/2021)   No facility-administered encounter medications on file as of 03/28/2021.    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 family unaware   Dysphagia    Dyspnea    Generalized weakness    GERD (gastroesophageal reflux disease)    GERD (gastroesophageal reflux disease)    High cholesterol    Hoarseness    Hx of cardiovascular stress test    negative 2012 and 2016   Hypertension    Insomnia    Intervertebral disc disorder with myelopathy, lumbar region    Lumbago    Lumbar spine pain    PONV (postoperative nausea and vomiting)    confusion after anesthesia   Renal insufficiency    Ringing in ears    resolved   Spondylosis with myelopathy, lumbar region    legs, back   Past Surgical History:  Procedure Laterality Date   ABDOMINAL HYSTERECTOMY     BACK SURGERY     BONE MARROW ASPIRATION     CERVICAL SPINE SURGERY     EYE SURGERY Bilateral    Lasic   FIXATION KYPHOPLASTY     LAMINOTOMY     SPINAL FUSION     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   Procedure: TOTAL HIP ARTHROPLASTY ANTERIOR APPROACH;  Surgeon: Paralee Cancel, MD;  Location: [facility];  Service: Orthopedics;  Laterality: Right;  33 mins   Family History  Problem Relation Age of Onset   Diabetes Mother    Heart disease Mother    Hypertension Mother    Gallbladder disease Mother    Heart disease Father    Pneumonia Sister    Breast cancer Paternal Grandmother    Hypertension Brother    Hyperlipidemia Brother    Hypertension Child    Gallbladder disease Other    Social History   Socioeconomic History   Marital status: Divorced    Spouse name: Not on file   Number of children: 2   Years of education: Not on file   Highest  education level: Not on file  Occupational History   Not on file  Tobacco Use   Smoking status: Former    Packs/day: 4.00    Years: 3.00    Pack years: 12.00    Types: Cigarettes    Start date: 06/10/1960    Quit date: 02/20/1964    Years since quitting: 32.1   Smokeless tobacco: Never  Vaping Use   Vaping Use: Never used  Substance and Sexual Activity   Alcohol use: No    Alcohol/week: 0.0 standard drinks   Drug use: No   Sexual activity: Not Currently    Birth control/protection: None  Other Topics Concern   Not on file  Social  History Narrative   ** Merged History Encounter **       ** Merged History Encounter **  Ms [name] lives alone. She is separated/divorced from her second husband and widowed by her first husband. She has twin daughters, [name] and [name]. [name] has been a cause of stress for her and she believes was preven   ting her from getting medical care that she needed. She is no longer talking to [place]. She states that [name] is involved in her care and that she trusts her to have her best interest in mind.      Social Determinants of Health   Financial Resource Strain: Low Risk    Difficulty of Paying Living Expenses: Not hard at all  Food Insecurity: No Food Insecurity   Worried About Charity fundraiser in the Last Year: Never true   [place] in the Last Year: Never true  Transportation Needs: No Transportation Needs   Lack of Transportation (Medical): No   Lack of Transportation (Non-Medical): No  Physical Activity: Insufficiently Active   Days of Exercise per Week: 5 days   Minutes of Exercise per Session: 20 min  Stress: No Stress Concern Present   Feeling of Stress : Not at all  Social Connections: Moderately Integrated   Frequency of Communication with Friends and Family: More than three times a week   Frequency of Social Gatherings with Friends and Family: More than three times a week   Attends Religious Services: 1 to 4 times per  year   Active Member of [employer] or Organizations: Yes   Attends Archivist Meetings: 1 to 4 times per year   Marital Status: Divorced    Tobacco Counseling Counseling given: Not Answered   Clinical Intake:  Pre-visit preparation completed: Yes  Pain : No/denies pain     BMI - recorded: 23.78 Nutritional Status: BMI of 19-24  Normal Nutritional Risks: None Diabetes: No  How often do you need to have someone help you when you read instructions, pamphlets, or other written materials from your doctor or pharmacy?: 1 - Never  Diabetic?NO  Interpreter Needed?: No  Information entered by :: MJ Anden Bartolo, LPN   Activities of Daily Living In your present state of health, do you have any difficulty performing the following activities: 03/28/2021  Hearing? N  Vision? N  Difficulty concentrating or making decisions? N  Walking or climbing stairs? N  Dressing or bathing? N  Doing errands, shopping? N  Preparing Food and eating ? N  Using the Toilet? N  In the past six months, have you accidently leaked urine? Y  Comment Urgency per pt.  Do you have problems with loss of bowel control? N  Managing your Medications? N  Managing your Finances? N  Housekeeping or managing your Housekeeping? N  Some recent data might be hidden    Patient Care Team: Janora Norlander, DO as PCP - General (Family Medicine) Harl Bowie Alphonse Guild, MD as PCP - Cardiology (Cardiology) Karenann Cai, [place] as Physician Assistant (Orthopedic Surgery)  Indicate any recent Medical Services you may have received from other than [facility] providers in the past year (date may be approximate).     Assessment:   This is a routine wellness examination for [place].  Hearing/Vision screen Hearing Screening - Comments:: No hearing issues.  Vision Screening - Comments:: Glasses. [facility] [place] 20[facility].  Dietary issues and exercise activities discussed: Current Exercise Habits: Home exercise routine,  Type of  exercise: walking, Time (Minutes): 20, Frequency (Times/Week): 5, Weekly Exercise (Minutes/Week): 100, Intensity: Mild, Exercise limited by: cardiac condition(s)   Goals Addressed             This Visit's Progress    Exercise 3x per week (30 min per time)       Increase as tolerated.        Depression Screen PHQ 2/9 Scores 03/28/2021 03/09/2021 01/23/2021 12/05/2020 10/24/2020 08/24/2020 04/18/2020  PHQ - 2 Score 0 0 5 4 3  0 1  PHQ- 9 Score - - 5 7 3 1 6     Fall Risk Fall Risk  03/28/2021 03/09/2021 01/23/2021 12/05/2020 10/24/2020  Falls in the past year? 0 0 0 0 0  Number falls in past yr: 0 - - - -  Injury with Fall? 0 - - - -  Comment - - - - -  Risk for fall due to : Impaired balance/gait - - - -  Follow up Falls prevention discussed - - - -    FALL RISK PREVENTION PERTAINING TO THE HOME:  Any stairs in or around the home? Yes  If so, are there any without handrails? No  Home free of loose throw rugs in walkways, pet beds, electrical cords, etc? Yes  Adequate lighting in your home to reduce risk of falls? Yes   ASSISTIVE DEVICES UTILIZED TO PREVENT FALLS:  Life alert? Yes  Use of a cane, walker or w/c? No  Grab bars in the bathroom? Yes  Shower chair or bench in shower? Yes  Elevated toilet seat or a handicapped toilet? Yes   TIMED UP AND GO:  Was the test performed? No .  Phone visit.  Cognitive Function: MMSE - Mini Mental State Exam 12/17/2019 08/20/2019  Orientation to time 5 3  Orientation to Place 5 5  Registration 3 0  Attention/ Calculation 2 0  Recall 2 2  Language- name 2 objects 2 2  Language- repeat 1 0  Language- follow 3 step command 3 3  Language- read & follow direction 1 1  Write a sentence 1 0  Copy design 1 0  Total score 26 16     6CIT Screen 03/28/2021 03/28/2021  What Year? 0 points 0 points  What month? 0 points 0 points  What time? 0 points 0 points  Count back from 20 0 points 0 points  Months in reverse 4 points 4 points   Repeat phrase 2 points 2 points  Total Score 6 6    Immunizations Immunization History  Administered Date(s) Administered   Fluad Quad(high Dose 65+) 12/05/2020   Influenza Split 11/09/2012   Influenza, High Dose Seasonal PF 02/26/2012, 11/25/2013, 11/26/2014, 11/26/2015, 11/25/2016, 11/25/2017   Influenza,inj,Quad PF,6+ Mos 11/14/2015   Influenza-Unspecified 11/10/2013, 11/17/2014, 11/13/2017, 11/30/2018, 11/18/2020   Moderna Sars-Covid-2 Vaccination 04/27/2019, 05/25/2019, 01/25/2020   Pneumococcal Conjugate-13 11/10/2013   Pneumococcal Polysaccharide-23 02/25/2006, 08/28/2006, 10/26/2013, 12/02/2016   Td,absorbed, Preservative Free, Adult Use, Lf Unspecified 11/04/2006   Tdap 10/12/2010, 11/13/2017   Zoster Recombinat (Shingrix) 03/09/2021   Zoster, Live 10/27/2010    TDAP status: Up to date  Flu Vaccine status: Up to date  Pneumococcal vaccine status: Up to date  Covid-19 vaccine status: Completed vaccines  Qualifies for Shingles Vaccine? Yes   Zostavax completed Yes   Shingrix Completed?: Yes  Screening Tests Health Maintenance  Topic Date Due   COVID-19 Vaccine (4 - Booster for Moderna series) 03/21/2020   Zoster Vaccines- Shingrix (2 of 2) 05/04/2021  TETANUS/TDAP  11/14/2027   Pneumonia Vaccine 37+ Years old  Completed   INFLUENZA VACCINE  Completed   DEXA SCAN  Completed   HPV VACCINES  Aged Out    Health Maintenance  Health Maintenance Due  Topic Date Due   COVID-19 Vaccine (4 - Booster for Moderna series) 03/21/2020    Colorectal cancer screening: No longer required.   Mammogram status: Completed 10/25/2020. Repeat every year  Bone Density status: Ordered 03/28/2021. Pt provided with contact info and advised to call to schedule appt.  Lung Cancer Screening: (Low Dose CT Chest recommended if Age 18-80 years, 30 pack-year currently smoking OR have quit w/in 15years.) does not qualify.    Additional Screening:  Hepatitis C Screening: does not  qualify;   Vision Screening: Recommended annual ophthalmology exams for early detection of glaucoma and other disorders of the eye. Is the patient up to date with their annual eye exam?  Yes  Who is the provider or what is the name of the office in which the patient attends annual eye exams? [facility] If pt is not established with a provider, would they like to be referred to a provider to establish care? No .   Dental Screening: Recommended annual dental exams for proper oral hygiene  Community Resource Referral / Chronic Care Management: CRR required this visit?  No   CCM required this visit?  No      Plan:     I have personally reviewed and noted the following in the patients chart:   Medical and social history Use of alcohol, tobacco or illicit drugs  Current medications and supplements including opioid prescriptions. Patient is currently taking opioid prescriptions. Information provided to patient regarding non-opioid alternatives. Patient advised to discuss non-opioid treatment plan with their provider. Functional ability and status Nutritional status Physical activity Advanced directives List of other physicians Hospitalizations, surgeries, and ER visits in previous 12 months Vitals Screenings to include cognitive, depression, and falls Referrals and appointments  In addition, I have reviewed and discussed with patient certain preventive protocols, quality metrics, and best practice recommendations. A written personalized care plan for preventive services as well as general preventive health recommendations were provided to patient.     Chriss Driver, LPN   1/0/3013   Nurse Notes: Pt is up to date on health maintenance and vaccines except for bone density. Discussed with pt and pt would like to repeat bone density. Order placed.

## 2021-03-28 NOTE — Patient Instructions (Signed)
Kaitlyn Serrano , Thank you for taking time to come for your Medicare Wellness Visit. I appreciate your ongoing commitment to your health goals. Please review the following plan we discussed and let me know if I can assist you in the future.   Screening recommendations/referrals: Colonoscopy: Done 04/02/2016. No longer required. Mammogram: Done 10/25/2020. Repeat annually  Bone Density: Done 02/04/2014. Order placed to repeat this year.  Recommended yearly ophthalmology/optometry visit for glaucoma screening and checkup Recommended yearly dental visit for hygiene and checkup  Vaccinations: Influenza vaccine: Done 12/05/2020 Repeat annually  Pneumococcal vaccine: Done 11/10/2013 and 12/02/2016. Tdap vaccine: Done 11/13/2017 Repeat in 10 years  Shingles vaccine: Done 10/27/2010 and 03/09/2021.   Covid-19:Done 04/27/2019, 05/25/2019 and 01/25/2020.  Advanced directives: Advance directive discussed with you today. Even though you declined this today, please call our office should you change your mind, and we can give you the proper paperwork for you to fill out.   Conditions/risks identified: Aim for 30 minutes of exercise or brisk walking each day, drink 6-8 glasses of water and eat lots of fruits and vegetables.   Next appointment: Follow up in one year for your annual wellness visit 2024   Preventive Care 65 Years and Older, Female Preventive care refers to lifestyle choices and visits with your health care provider that can promote health and wellness. What does preventive care include? A yearly physical exam. This is also called an annual well check. Dental exams once or twice a year. Routine eye exams. Ask your health care provider how often you should have your eyes checked. Personal lifestyle choices, including: Daily care of your teeth and gums. Regular physical activity. Eating a healthy diet. Avoiding tobacco and drug use. Limiting alcohol use. Practicing safe sex. Taking low-dose  aspirin every day. Taking vitamin and mineral supplements as recommended by your health care provider. What happens during an annual well check? The services and screenings done by your health care provider during your annual well check will depend on your age, overall health, lifestyle risk factors, and family history of disease. Counseling  Your health care provider may ask you questions about your: Alcohol use. Tobacco use. Drug use. Emotional well-being. Home and relationship well-being. Sexual activity. Eating habits. History of falls. Memory and ability to understand (cognition). Work and work Statistician. Reproductive health. Screening  You may have the following tests or measurements: Height, weight, and BMI. Blood pressure. Lipid and cholesterol levels. These may be checked every 5 years, or more frequently if you are over 25 years old. Skin check. Lung cancer screening. You may have this screening every year starting at age 25 if you have a 30-pack-year history of smoking and currently smoke or have quit within the past 15 years. Fecal occult blood test (FOBT) of the stool. You may have this test every year starting at age 36. Flexible sigmoidoscopy or colonoscopy. You may have a sigmoidoscopy every 5 years or a colonoscopy every 10 years starting at age 63. Hepatitis C blood test. Hepatitis B blood test. Sexually transmitted disease (STD) testing. Diabetes screening. This is done by checking your blood sugar (glucose) after you have not eaten for a while (fasting). You may have this done every 1-3 years. Bone density scan. This is done to screen for osteoporosis. You may have this done starting at age 75. Mammogram. This may be done every 1-2 years. Talk to your health care provider about how often you should have regular mammograms. Talk with your health care provider about  your test results, treatment options, and if necessary, the need for more tests. Vaccines  Your  health care provider may recommend certain vaccines, such as: Influenza vaccine. This is recommended every year. Tetanus, diphtheria, and acellular pertussis (Tdap, Td) vaccine. You may need a Td booster every 10 years. Zoster vaccine. You may need this after age 89. Pneumococcal 13-valent conjugate (PCV13) vaccine. One dose is recommended after age 57. Pneumococcal polysaccharide (PPSV23) vaccine. One dose is recommended after age 23. Talk to your health care provider about which screenings and vaccines you need and how often you need them. This information is not intended to replace advice given to you by your health care provider. Make sure you discuss any questions you have with your health care provider. Document Released: 03/10/2015 Document Revised: 11/01/2015 Document Reviewed: 12/13/2014 Elsevier Interactive Patient Education  2017 [place] Prevention in the Home Falls can cause injuries. They can happen to people of all ages. There are many things you can do to make your home safe and to help prevent falls. What can I do on the outside of my home? Regularly fix the edges of walkways and driveways and fix any cracks. Remove anything that might make you trip as you walk through a door, such as a raised step or threshold. Trim any bushes or trees on the path to your home. Use bright outdoor lighting. Clear any walking paths of anything that might make someone trip, such as rocks or tools. Regularly check to see if handrails are loose or broken. Make sure that both sides of any steps have handrails. Any raised decks and porches should have guardrails on the edges. Have any leaves, snow, or ice cleared regularly. Use sand or salt on walking paths during winter. Clean up any spills in your garage right away. This includes oil or grease spills. What can I do in the bathroom? Use night lights. Install grab bars by the toilet and in the tub and shower. Do not use towel bars as  grab bars. Use non-skid mats or decals in the tub or shower. If you need to sit down in the shower, use a plastic, non-slip stool. Keep the floor dry. Clean up any water that spills on the floor as soon as it happens. Remove soap buildup in the tub or shower regularly. Attach bath mats securely with double-sided non-slip rug tape. Do not have throw rugs and other things on the floor that can make you trip. What can I do in the bedroom? Use night lights. Make sure that you have a light by your bed that is easy to reach. Do not use any sheets or blankets that are too big for your bed. They should not hang down onto the floor. Have a firm chair that has side arms. You can use this for support while you get dressed. Do not have throw rugs and other things on the floor that can make you trip. What can I do in the kitchen? Clean up any spills right away. Avoid walking on wet floors. Keep items that you use a lot in easy-to-reach places. If you need to reach something above you, use a strong step stool that has a grab bar. Keep electrical cords out of the way. Do not use floor polish or wax that makes floors slippery. If you must use wax, use non-skid floor wax. Do not have throw rugs and other things on the floor that can make you trip. What can I do with  my stairs? Do not leave any items on the stairs. Make sure that there are handrails on both sides of the stairs and use them. Fix handrails that are broken or loose. Make sure that handrails are as long as the stairways. Check any carpeting to make sure that it is firmly attached to the stairs. Fix any carpet that is loose or worn. Avoid having throw rugs at the top or bottom of the stairs. If you do have throw rugs, attach them to the floor with carpet tape. Make sure that you have a light switch at the top of the stairs and the bottom of the stairs. If you do not have them, ask someone to add them for you. What else can I do to help prevent  falls? Wear shoes that: Do not have high heels. Have rubber bottoms. Are comfortable and fit you well. Are closed at the toe. Do not wear sandals. If you use a stepladder: Make sure that it is fully opened. Do not climb a closed stepladder. Make sure that both sides of the stepladder are locked into place. Ask someone to hold it for you, if possible. Clearly mark and make sure that you can see: Any grab bars or handrails. First and last steps. Where the edge of each step is. Use tools that help you move around (mobility aids) if they are needed. These include: Canes. Walkers. Scooters. Crutches. Turn on the lights when you go into a dark area. Replace any light bulbs as soon as they burn out. Set up your furniture so you have a clear path. Avoid moving your furniture around. If any of your floors are uneven, fix them. If there are any pets around you, be aware of where they are. Review your medicines with your doctor. Some medicines can make you feel dizzy. This can increase your chance of falling. Ask your doctor what other things that you can do to help prevent falls. This information is not intended to replace advice given to you by your health care provider. Make sure you discuss any questions you have with your health care provider. Document Released: 12/08/2008 Document Revised: 07/20/2015 Document Reviewed: 03/18/2014 Elsevier Interactive Patient Education  2017 [employer].

## 2021-04-01 ENCOUNTER — Other Ambulatory Visit: Payer: Self-pay | Admitting: Cardiology

## 2021-04-04 DIAGNOSIS — Z79891 Long term (current) use of opiate analgesic: Secondary | ICD-10-CM | POA: Diagnosis not present

## 2021-04-04 DIAGNOSIS — G894 Chronic pain syndrome: Secondary | ICD-10-CM | POA: Diagnosis not present

## 2021-04-04 DIAGNOSIS — Z6822 Body mass index (BMI) 22.0-22.9, adult: Secondary | ICD-10-CM | POA: Diagnosis not present

## 2021-04-04 DIAGNOSIS — M791 Myalgia, unspecified site: Secondary | ICD-10-CM | POA: Diagnosis not present

## 2021-04-04 DIAGNOSIS — I10 Essential (primary) hypertension: Secondary | ICD-10-CM | POA: Diagnosis not present

## 2021-04-04 DIAGNOSIS — M5106 Intervertebral disc disorders with myelopathy, lumbar region: Secondary | ICD-10-CM | POA: Diagnosis not present

## 2021-04-04 DIAGNOSIS — Z79899 Other long term (current) drug therapy: Secondary | ICD-10-CM | POA: Diagnosis not present

## 2021-04-09 ENCOUNTER — Other Ambulatory Visit: Payer: Self-pay | Admitting: Family Medicine

## 2021-04-09 DIAGNOSIS — J301 Allergic rhinitis due to pollen: Secondary | ICD-10-CM

## 2021-04-16 ENCOUNTER — Other Ambulatory Visit: Payer: Self-pay | Admitting: Family Medicine

## 2021-04-16 DIAGNOSIS — F411 Generalized anxiety disorder: Secondary | ICD-10-CM

## 2021-04-18 IMAGING — DX DG PORTABLE PELVIS
1 series · 1 of 1 positions shown · non-contrast
Comparison: Intraoperative spot film same date.

CLINICAL DATA: Postop total right hip arthroplasty.

EXAM:
PORTABLE PELVIS 1-2 VIEWS

[pelvis ap]
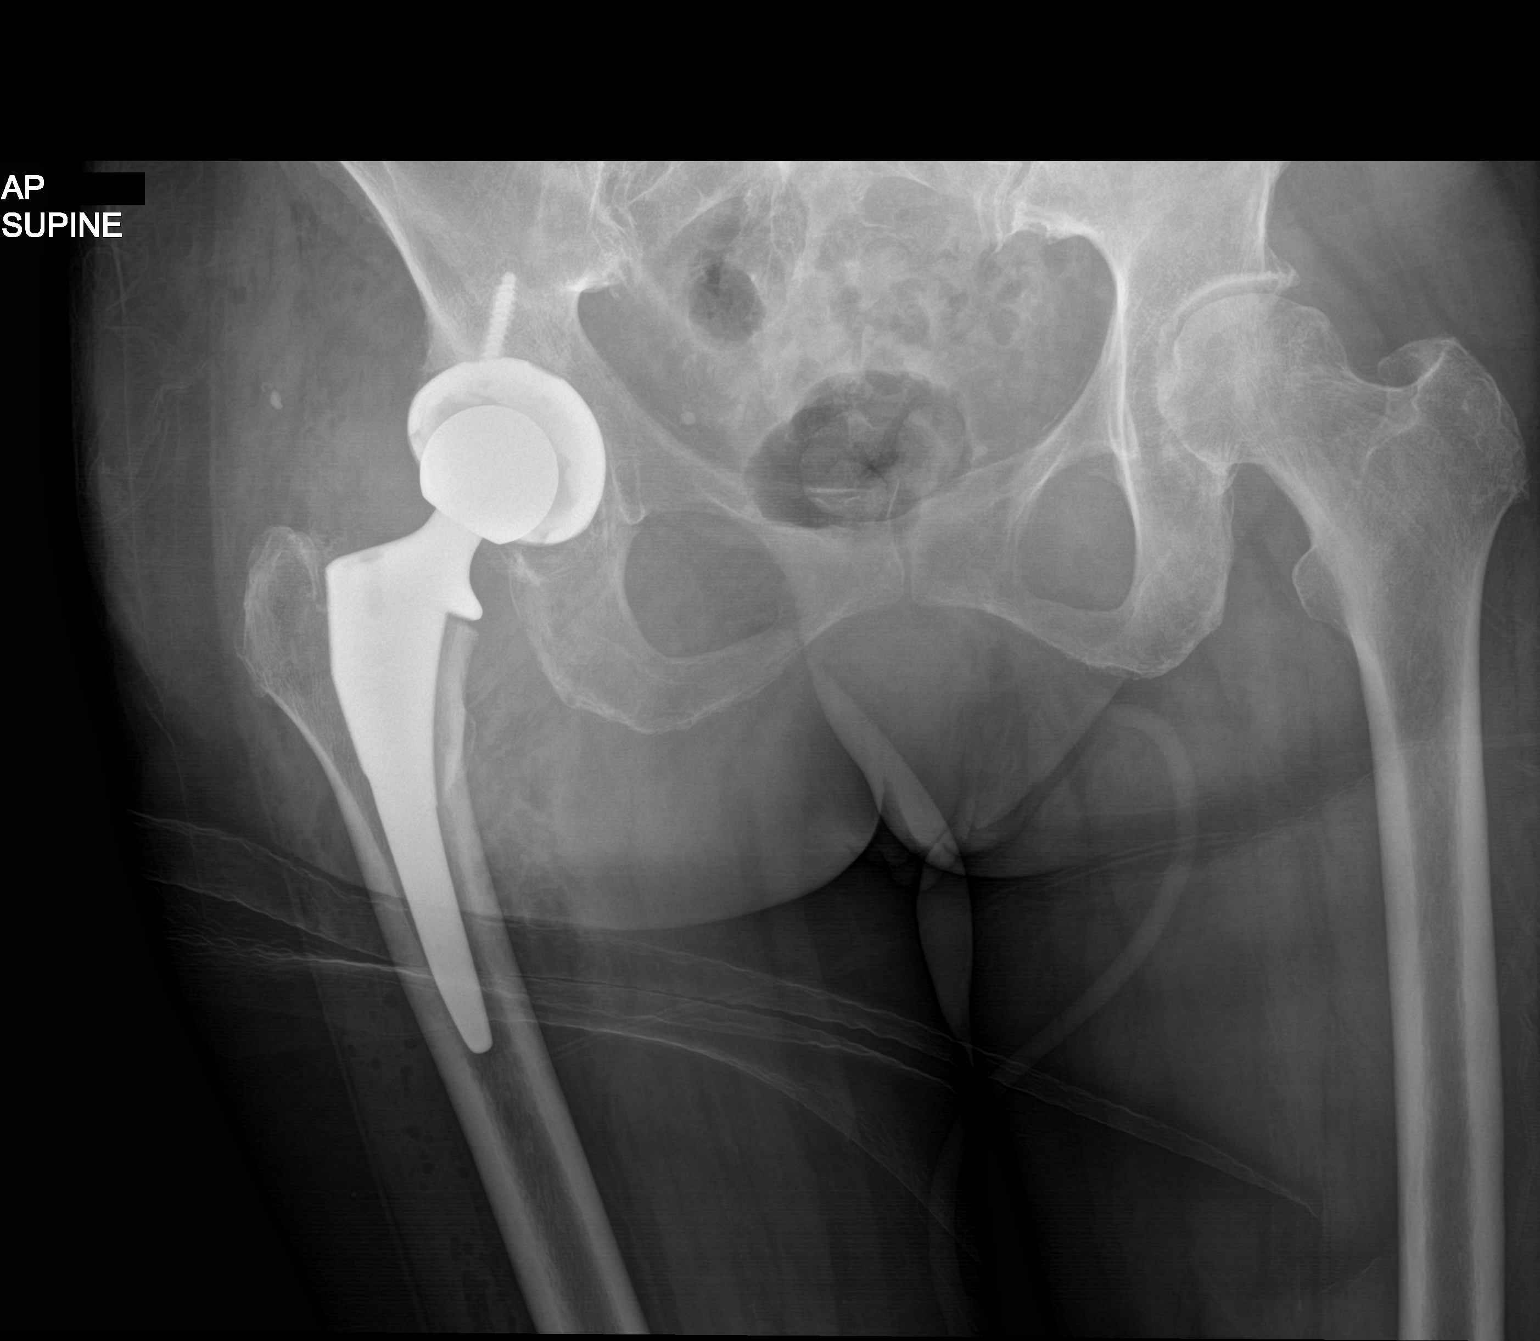

[1 of 1 positions shown; findings below may reference images not displayed]

FINDINGS: The femoral and acetabular components are well seated. No
complicating features are identified. The pubic symphysis and SI
joints appear intact. Moderate degenerative changes involving the
left hip joint.
IMPRESSION: Well seated components of a total right hip arthroplasty.

## 2021-04-26 ENCOUNTER — Ambulatory Visit (INDEPENDENT_AMBULATORY_CARE_PROVIDER_SITE_OTHER): Payer: Medicare HMO

## 2021-04-26 DIAGNOSIS — Z780 Asymptomatic menopausal state: Secondary | ICD-10-CM

## 2021-04-26 DIAGNOSIS — Z9181 History of falling: Secondary | ICD-10-CM

## 2021-04-27 DIAGNOSIS — Z780 Asymptomatic menopausal state: Secondary | ICD-10-CM | POA: Diagnosis not present

## 2021-04-27 DIAGNOSIS — M85832 Other specified disorders of bone density and structure, left forearm: Secondary | ICD-10-CM | POA: Diagnosis not present

## 2021-05-02 DIAGNOSIS — M791 Myalgia, unspecified site: Secondary | ICD-10-CM | POA: Diagnosis not present

## 2021-05-02 DIAGNOSIS — M5106 Intervertebral disc disorders with myelopathy, lumbar region: Secondary | ICD-10-CM | POA: Diagnosis not present

## 2021-05-02 DIAGNOSIS — Z6823 Body mass index (BMI) 23.0-23.9, adult: Secondary | ICD-10-CM | POA: Diagnosis not present

## 2021-05-02 DIAGNOSIS — G894 Chronic pain syndrome: Secondary | ICD-10-CM | POA: Diagnosis not present

## 2021-05-08 NOTE — Progress Notes (Signed)
Patient notified and appt made

## 2021-05-22 ENCOUNTER — Ambulatory Visit (INDEPENDENT_AMBULATORY_CARE_PROVIDER_SITE_OTHER): Payer: Medicare HMO | Admitting: Family Medicine

## 2021-05-22 DIAGNOSIS — Z91199 Patient's noncompliance with other medical treatment and regimen due to unspecified reason: Secondary | ICD-10-CM

## 2021-05-22 NOTE — Progress Notes (Signed)
Telephone visit ? ?Attempted to reach 3 times with no answer. Patient to reschedule. ? ?Start time: 10:11am (VM not activated), 10:12am (no answer); 10:16am (no VM no answer) ? ?  ? ? ? ?

## 2021-05-30 DIAGNOSIS — M5106 Intervertebral disc disorders with myelopathy, lumbar region: Secondary | ICD-10-CM | POA: Diagnosis not present

## 2021-05-30 DIAGNOSIS — G894 Chronic pain syndrome: Secondary | ICD-10-CM | POA: Diagnosis not present

## 2021-05-30 DIAGNOSIS — M7918 Myalgia, other site: Secondary | ICD-10-CM | POA: Diagnosis not present

## 2021-05-30 DIAGNOSIS — M791 Myalgia, unspecified site: Secondary | ICD-10-CM | POA: Diagnosis not present

## 2021-06-11 ENCOUNTER — Telehealth: Payer: Self-pay | Admitting: Family Medicine

## 2021-06-11 DIAGNOSIS — K219 Gastro-esophageal reflux disease without esophagitis: Secondary | ICD-10-CM

## 2021-06-11 DIAGNOSIS — I10 Essential (primary) hypertension: Secondary | ICD-10-CM

## 2021-06-11 DIAGNOSIS — F411 Generalized anxiety disorder: Secondary | ICD-10-CM

## 2021-06-11 MED ORDER — LORATADINE 10 MG PO TABS: 10.0000 mg | ORAL_TABLET | Freq: Every day | ORAL | 1 refills | Status: DC

## 2021-06-11 MED ORDER — ATENOLOL 25 MG PO TABS: 25.0000 mg | ORAL_TABLET | Freq: Every day | ORAL | 3 refills | Status: DC

## 2021-06-11 MED ORDER — TRAZODONE HCL 50 MG PO TABS: 25.0000 mg | ORAL_TABLET | Freq: Every evening | ORAL | 3 refills | Status: DC | PRN

## 2021-06-11 MED ORDER — LISINOPRIL 40 MG PO TABS: 40.0000 mg | ORAL_TABLET | Freq: Every day | ORAL | 3 refills | Status: DC

## 2021-06-11 MED ORDER — PANTOPRAZOLE SODIUM 40 MG PO TBEC: 40.0000 mg | DELAYED_RELEASE_TABLET | Freq: Every day | ORAL | 3 refills | Status: DC | PRN

## 2021-06-11 MED ORDER — BUSPIRONE HCL 15 MG PO TABS: 15.0000 mg | ORAL_TABLET | Freq: Three times a day (TID) | ORAL | 0 refills | Status: DC

## 2021-06-11 MED ORDER — DILTIAZEM HCL ER BEADS 120 MG PO CP24: ORAL_CAPSULE | ORAL | 3 refills | Status: DC

## 2021-06-11 NOTE — Telephone Encounter (Signed)
Pt called stating that she only uses [facility] in [place] now and needs all of her refills sent to them because she is almost out of all of her meds. ?

## 2021-06-18 ENCOUNTER — Encounter: Payer: Self-pay | Admitting: Family Medicine

## 2021-06-18 ENCOUNTER — Ambulatory Visit (INDEPENDENT_AMBULATORY_CARE_PROVIDER_SITE_OTHER): Payer: Medicare HMO | Admitting: Family Medicine

## 2021-06-18 VITALS — BP 149/80 | HR 77 | Temp 98.2°F | Ht 62.0 in | Wt 120.8 lb

## 2021-06-18 DIAGNOSIS — N898 Other specified noninflammatory disorders of vagina: Secondary | ICD-10-CM | POA: Diagnosis not present

## 2021-06-18 DIAGNOSIS — N3000 Acute cystitis without hematuria: Secondary | ICD-10-CM

## 2021-06-18 LAB — URINALYSIS, ROUTINE W REFLEX MICROSCOPIC
Bilirubin, UA: NEGATIVE
Glucose, UA: NEGATIVE
Leukocytes,UA: NEGATIVE
Nitrite, UA: NEGATIVE
Specific Gravity, UA: >1.03 — ABNORMAL HIGH (ref 1.005–1.030)
Urobilinogen, Ur: 1 mg/dL (ref 0.2–1.0)
pH, UA: 5 (ref 5.0–7.5)

## 2021-06-18 LAB — MICROSCOPIC EXAMINATION: Renal Epithel, UA: NONE SEEN /hpf

## 2021-06-18 LAB — WET PREP FOR TRICH, YEAST, CLUE
Clue Cell Exam: NEGATIVE
Trichomonas Exam: NEGATIVE
Yeast Exam: NEGATIVE

## 2021-06-18 MED ORDER — SULFAMETHOXAZOLE-TRIMETHOPRIM 800-160 MG PO TABS: 1.0000 | ORAL_TABLET | Freq: Two times a day (BID) | ORAL | 0 refills | Status: AC

## 2021-06-18 MED ORDER — FLUCONAZOLE 150 MG PO TABS: 150.0000 mg | ORAL_TABLET | Freq: Once | ORAL | 0 refills | Status: AC

## 2021-06-18 NOTE — Patient Instructions (Signed)
Urinary Tract Infection, Adult A urinary tract infection (UTI) is an infection of any part of the urinary tract. The urinary tract includes: The kidneys. The ureters. The bladder. The urethra. These organs make, store, and get rid of pee (urine) in the body. What are the causes? This infection is caused by germs (bacteria) in your genital area. These germs grow and cause swelling (inflammation) of your urinary tract. What increases the risk? The following factors may make you more likely to develop this condition: Using a small, thin tube (catheter) to drain pee. Not being able to control when you pee or poop (incontinence). Being female. If you are female, these things can increase the risk: Using these methods to prevent pregnancy: A medicine that kills sperm (spermicide). A device that blocks sperm (diaphragm). Having low levels of a female hormone (estrogen). Being pregnant. You are more likely to develop this condition if: You have genes that add to your risk. You are sexually active. You take antibiotic medicines. You have trouble peeing because of: A prostate that is bigger than normal, if you are female. A blockage in the part of your body that drains pee from the bladder. A kidney stone. A nerve condition that affects your bladder. Not getting enough to drink. Not peeing often enough. You have other conditions, such as: Diabetes. A weak disease-fighting system (immune system). Sickle cell disease. Gout. Injury of the spine. What are the signs or symptoms? Symptoms of this condition include: Needing to pee right away. Peeing small amounts often. Pain or burning when peeing. Blood in the pee. Pee that smells bad or not like normal. Trouble peeing. Pee that is cloudy. Fluid coming from the vagina, if you are female. Pain in the belly or lower back. Other symptoms include: Vomiting. Not feeling hungry. Feeling mixed up (confused). This may be the first symptom in  older adults. Being tired and grouchy (irritable). A fever. Watery poop (diarrhea). How is this treated? Taking antibiotic medicine. Taking other medicines. Drinking enough water. In some cases, you may need to see a specialist. Follow these instructions at home:  Medicines Take over-the-counter and prescription medicines only as told by your doctor. If you were prescribed an antibiotic medicine, take it as told by your doctor. Do not stop taking it even if you start to feel better. General instructions Make sure you: Pee until your bladder is empty. Do not hold pee for a long time. Empty your bladder after sex. Wipe from front to back after peeing or pooping if you are a female. Use each tissue one time when you wipe. Drink enough fluid to keep your pee pale yellow. Keep all follow-up visits. Contact a doctor if: You do not get better after 1-2 days. Your symptoms go away and then come back. Get help right away if: You have very bad back pain. You have very bad pain in your lower belly. You have a fever. You have chills. You feeling like you will vomit or you vomit. Summary A urinary tract infection (UTI) is an infection of any part of the urinary tract. This condition is caused by germs in your genital area. There are many risk factors for a UTI. Treatment includes antibiotic medicines. Drink enough fluid to keep your pee pale yellow. This information is not intended to replace advice given to you by your health care provider. Make sure you discuss any questions you have with your health care provider. Document Revised: 09/24/2019 Document Reviewed: 09/24/2019 Elsevier Patient Education    2023 Elsevier Inc.  

## 2021-06-18 NOTE — Progress Notes (Signed)
? ?Subjective: ?CC: Vaginal burning and itching ?PCP: Kaitlyn Norlander, DO ?VVO:[id number] T [name] is a 81 y.o. female presenting to clinic today for: ? ?1.  Vaginal burning and itching ?Patient reports dysuria.  No hematuria.  She reports difficulty evacuating her bladder and has had urgency.  No fevers, nausea, vomiting reported.  She does feel like there is some white discharge from the vagina but denies any recent antibiotic use. ? ? ?ROS: Per HPI ? ?Allergies  ?Allergen Reactions  ? Bee Venom Anaphylaxis  ? Hydrocodone Swelling  ? Penicillins Anaphylaxis and Rash  ?  Has patient had a PCN reaction causing immediate rash, facial/tongue/throat swelling, SOB or lightheadedness with hypotension: Yes ?Has patient had a PCN reaction causing severe rash involving mucus membranes or skin necrosis: Yes ?Has patient had a PCN reaction that required hospitalization: Yes ?Has patient had a PCN reaction occurring within the last 10 years: No ?If all of the above answers are "NO", then may proceed with Cephalosporin use. ?  ? Amlodipine Swelling  ? Elavil [Amitriptyline Hcl] Other (See Comments)  ?  Confusion, hallucinations  ? Statins Swelling and Other (See Comments)  ?  Peeling of skin  ? ?Past Medical History:  ?Diagnosis Date  ? 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 Chronic pain syndrome   ? Complication of anesthesia   ? Diverticulosis   ? 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 normal. family unaware  ? Dysphagia   ? Dyspnea   ? Generalized weakness   ? GERD (gastroesophageal reflux disease)   ? GERD (gastroesophageal reflux disease)   ? High cholesterol   ? Hoarseness   ? Hx of cardiovascular stress test   ? negative 2012 and 2016  ? Hypertension   ? Insomnia   ? Intervertebral disc disorder with myelopathy, lumbar region   ? Lumbago   ? Lumbar spine pain   ? PONV (postoperative nausea and vomiting)   ? confusion after anesthesia  ? Renal insufficiency   ? Ringing in ears   ? resolved  ? Spondylosis with myelopathy,  lumbar region   ? legs, back  ? ? ?Current Outpatient Medications:  ?  acetaminophen (TYLENOL) 500 MG tablet, Take 2 tablets (1,000 mg total) by mouth every 8 (eight) hours., Disp: 30 tablet, Rfl: 0 ?  albuterol (VENTOLIN HFA) 108 (90 Base) MCG/ACT inhaler, albuterol sulfate HFA 90 mcg/actuation aerosol inhaler, Disp: , Rfl:  ?  atenolol (TENORMIN) 25 MG tablet, Take 1 tablet (25 mg total) by mouth daily., Disp: 90 tablet, Rfl: 3 ?  busPIRone (BUSPAR) 15 MG tablet, Take 1 tablet (15 mg total) by mouth 3 (three) times daily., Disp: 270 tablet, Rfl: 0 ?  cyclobenzaprine (FLEXERIL) 10 MG tablet, Take 10 mg by mouth as needed for muscle spasms., Disp: , Rfl:  ?  diclofenac (VOLTAREN) 75 MG EC tablet, TAKE 1 TABLET BY MOUTH TWICE DAILY AS NEEDED FOR MUSCLE AND JOINT PAIN, Disp: 180 tablet, Rfl: 0 ?  diltiazem (TIADYLT ER) 120 MG 24 hr capsule, TAKE 1 CAPSULE BY MOUTH EVERY DAY, Disp: 90 capsule, Rfl: 3 ?  docusate sodium (COLACE) 100 MG capsule, Take 1 capsule (100 mg total) by mouth 2 (two) times daily., Disp: 28 capsule, Rfl: 0 ?  FEMRING 0.05 MG/24HR RING, INSERT VAGINALLY AS DIRECTED EVERY 3 MONTHS, Disp: 1 each, Rfl: 2 ?  fluticasone (FLONASE) 50 MCG/ACT nasal spray, USE TWO SPRAYS IN EACH NOSTRIL ONCE DAILY, Disp: 48 mL, Rfl: 1 ?  hydrALAZINE (APRESOLINE) 25 MG tablet,  TAKE 1 TABLET BY MOUTH TWICE A DAY, Disp: 60 tablet, Rfl: 0 ?  HYDROcodone-acetaminophen (NORCO) 10-325 MG tablet, Take 1 tablet by mouth as needed., Disp: , Rfl:  ?  ipratropium (ATROVENT) 0.06 % nasal spray, ipratropium bromide 42 mcg (0.06 %) nasal spray, Disp: , Rfl:  ?  lisinopril (ZESTRIL) 20 MG tablet, , Disp: , Rfl:  ?  lisinopril (ZESTRIL) 40 MG tablet, Take 1 tablet (40 mg total) by mouth daily., Disp: 90 tablet, Rfl: 3 ?  loratadine (CLARITIN) 10 MG tablet, Take 1 tablet (10 mg total) by mouth daily., Disp: 90 tablet, Rfl: 1 ?  pantoprazole (PROTONIX) 40 MG tablet, Take 1 tablet (40 mg total) by mouth daily as needed (acid reflux)., Disp:  90 tablet, Rfl: 3 ?  polyethylene glycol (MIRALAX / GLYCOLAX) 17 g packet, Take 17 g by mouth 2 (two) times daily., Disp: 28 packet, Rfl: 0 ?  SHINGRIX injection, , Disp: , Rfl:  ?  tiZANidine (ZANAFLEX) 2 MG tablet, Take 2 mg by mouth 2 (two) times daily., Disp: , Rfl:  ?  traZODone (DESYREL) 50 MG tablet, Take 0.5-1.5 tablets (25-75 mg total) by mouth at bedtime as needed for sleep., Disp: 135 tablet, Rfl: 3 ?  busPIRone (BUSPAR) 10 MG tablet, buspirone 10 mg tablet  TAKE 1/2 TABLET BY MOUTH TWICE DAILY FOR 3 DAYS, THEN TAKE 1 TABLET TWICE DAILY (Patient not taking: Reported on 06/18/2021), Disp: , Rfl:  ?Social History  ? ?Socioeconomic History  ? Marital status: Divorced  ?  Spouse name: Not on file  ? Number of children: 2  ? Years of education: Not on file  ? Highest education level: Not on file  ?Occupational History  ? Not on file  ?Tobacco Use  ? Smoking status: Former  ?  Packs/day: 4.00  ?  Years: 3.00  ?  Pack years: 12.00  ?  Types: Cigarettes  ?  Start date: 06/10/1960  ?  Quit date: 02/20/1964  ?  Years since quitting: 57.3  ? Smokeless tobacco: Never  ?Vaping Use  ? Vaping Use: Never used  ?Substance and Sexual Activity  ? Alcohol use: No  ?  Alcohol/week: 0.0 standard drinks  ? Drug use: No  ? Sexual activity: Not Currently  ?  Birth control/protection: None  ?Other Topics Concern  ? Not on file  ?Social History Narrative  ? ** Merged History Encounter **  ?    ? ** Merged History Encounter ** ? ?[name] lives alone. She is separated/divorced from her second husband and widowed by her first husband. She has twin daughters, Kaitlyn Serrano and Kaitlyn Serrano. Kaitlyn Serrano has been a cause of stress for her and she believes was preven  ? ting her from getting medical care that she needed. She is no longer talking to [facility]. She states that Kaitlyn Serrano is involved in her care and that she trusts her to have her best interest in mind.  ?   ? ?Social Determinants of Health  ? ?Financial Resource Strain: Low Risk   ? Difficulty of  Paying Living Expenses: Not hard at all  ?Food Insecurity: No Food Insecurity  ? Worried About Charity fundraiser in the Last Year: Never true  ? Ran Out of Food in the Last Year: Never true  ?Transportation Needs: No Transportation Needs  ? Lack of Transportation (Medical): No  ? Lack of Transportation (Non-Medical): No  ?Physical Activity: Insufficiently Active  ? Days of Exercise per Week: 5 days  ? Minutes  of Exercise per Session: 20 min  ?Stress: No Stress Concern Present  ? Feeling of Stress : Not at all  ?Social Connections: Moderately Integrated  ? Frequency of Communication with Friends and Family: More than three times a week  ? Frequency of Social Gatherings with Friends and Family: More than three times a week  ? Attends Religious Services: 1 to 4 times per year  ? Active Member of Clubs or Organizations: Yes  ? Attends Archivist Meetings: 1 to 4 times per year  ? Marital Status: Divorced  ?Intimate Partner Violence: Not At Risk  ? Fear of Current or Ex-Partner: No  ? Emotionally Abused: No  ? Physically Abused: No  ? Sexually Abused: No  ? ?Family History  ?Problem Relation Age of Onset  ? Diabetes Mother   ? Heart disease Mother   ? Hypertension Mother   ? Gallbladder disease Mother   ? Heart disease Father   ? Pneumonia Sister   ? Breast cancer Paternal Grandmother   ? Hypertension Brother   ? Hyperlipidemia Brother   ? Hypertension Child   ? Gallbladder disease Other   ? ? ?Objective: ?Office vital signs reviewed. ?BP (!) 149/80   Pulse 77   Temp 98.2 ?F (36.8 ?C)   Ht '5\' 2"'$  (1.575 m)   Wt 120 lb 12.8 oz (54.8 kg)   SpO2 95%   BMI 22.09 kg/m?  ? ?Physical Examination:  ?General: Awake, alert, nontoxic female, No acute distress ?HEENT: Sclera white.  Dentition poor ?Cardio: regular rate and rhythm  ?Pulm: Normal work of breathing on room air ?GU: supraubic TTP present. No CVA TTP ? ?Assessment/ Plan: ?81 y.o. female  ? ?Acute cystitis without hematuria - Plan:  sulfamethoxazole-trimethoprim (BACTRIM DS) 800-160 MG tablet, Urine Culture ? ?Vaginal itching - Plan: Urinalysis, Routine w reflex microscopic, WET PREP FOR TRICH, YEAST, CLUE, fluconazole (DIFLUCAN) 150 MG tablet ? ?Urinalysis cons

## 2021-06-20 ENCOUNTER — Encounter: Payer: Self-pay | Admitting: Family Medicine

## 2021-06-20 LAB — URINE CULTURE

## 2021-06-25 ENCOUNTER — Telehealth: Payer: Self-pay | Admitting: Family Medicine

## 2021-06-25 NOTE — Telephone Encounter (Signed)
PT AWARE  

## 2021-06-26 ENCOUNTER — Other Ambulatory Visit: Payer: Self-pay | Admitting: Family Medicine

## 2021-06-26 DIAGNOSIS — N3000 Acute cystitis without hematuria: Secondary | ICD-10-CM

## 2021-06-27 DIAGNOSIS — Z6823 Body mass index (BMI) 23.0-23.9, adult: Secondary | ICD-10-CM | POA: Diagnosis not present

## 2021-06-27 DIAGNOSIS — G894 Chronic pain syndrome: Secondary | ICD-10-CM | POA: Diagnosis not present

## 2021-06-27 DIAGNOSIS — M5106 Intervertebral disc disorders with myelopathy, lumbar region: Secondary | ICD-10-CM | POA: Diagnosis not present

## 2021-06-27 DIAGNOSIS — M791 Myalgia, unspecified site: Secondary | ICD-10-CM | POA: Diagnosis not present

## 2021-07-31 DIAGNOSIS — M791 Myalgia, unspecified site: Secondary | ICD-10-CM | POA: Diagnosis not present

## 2021-07-31 DIAGNOSIS — M5106 Intervertebral disc disorders with myelopathy, lumbar region: Secondary | ICD-10-CM | POA: Diagnosis not present

## 2021-07-31 DIAGNOSIS — G894 Chronic pain syndrome: Secondary | ICD-10-CM | POA: Diagnosis not present

## 2021-08-01 ENCOUNTER — Encounter: Payer: Self-pay | Admitting: Family Medicine

## 2021-08-01 ENCOUNTER — Ambulatory Visit (INDEPENDENT_AMBULATORY_CARE_PROVIDER_SITE_OTHER): Payer: Medicare HMO | Admitting: Family Medicine

## 2021-08-01 DIAGNOSIS — R413 Other amnesia: Secondary | ICD-10-CM

## 2021-08-01 DIAGNOSIS — R4586 Emotional lability: Secondary | ICD-10-CM | POA: Diagnosis not present
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
## 2021-08-09 ENCOUNTER — Encounter: Payer: Self-pay | Admitting: Family Medicine

## 2021-08-09 ENCOUNTER — Ambulatory Visit (INDEPENDENT_AMBULATORY_CARE_PROVIDER_SITE_OTHER): Payer: Medicare HMO | Admitting: Family Medicine

## 2021-08-09 VITALS — BP 160/77 | HR 80 | Temp 98.0°F | Ht 62.0 in | Wt 115.8 lb

## 2021-08-09 DIAGNOSIS — F03B11 Unspecified dementia, moderate, with agitation: Secondary | ICD-10-CM | POA: Diagnosis not present

## 2021-08-09 MED ORDER — DONEPEZIL HCL 5 MG PO TABS: 5.0000 mg | ORAL_TABLET | Freq: Every day | ORAL | 0 refills | Status: DC

## 2021-08-09 NOTE — Progress Notes (Signed)
Subjective: CC: Memory changes PCP: Kaitlyn Norlander, DO FWY:[id number] Kaitlyn Serrano is a 81 y.o. female presenting to clinic today for:  1.  Memory and mood changes Patient's daughter reported some memory and mood changes on the seventh.  See that separate note.  Patient reports to me today that she has "been doing great".  She has no concerns today.  She spends her time living at home alone with her little dog.   ROS: Per HPI  Allergies  Allergen Reactions   Bee Venom Anaphylaxis   Hydrocodone Swelling   Penicillins Anaphylaxis and Rash    Has patient had a PCN reaction causing immediate rash, facial/tongue/throat swelling, SOB or lightheadedness with hypotension: Yes Has patient had a PCN reaction causing severe rash involving mucus membranes or skin necrosis: Yes Has patient had a PCN reaction that required hospitalization: Yes Has patient had a PCN reaction occurring within the last 10 years: No If all of the above answers are "NO", then may proceed with Cephalosporin use.    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 family unaware   Dysphagia    Dyspnea    Generalized weakness    GERD (gastroesophageal reflux disease)    GERD (gastroesophageal reflux disease)    High cholesterol    Hoarseness    Hx of cardiovascular stress test    negative 2012 and 2016   Hypertension    Insomnia    Intervertebral disc disorder with myelopathy, lumbar region    Lumbago    Lumbar spine pain    PONV (postoperative nausea and vomiting)    confusion after anesthesia   Renal insufficiency    Ringing in ears    resolved   Spondylosis with myelopathy, lumbar region     legs, back    Current Outpatient Medications:    acetaminophen (TYLENOL) 500 MG tablet, Take 2 tablets (1,000 mg total) by mouth every 8 (eight) hours., Disp: 30 tablet, Rfl: 0   albuterol (VENTOLIN HFA) 108 (90 Base) MCG/ACT inhaler, albuterol sulfate HFA 90 mcg/actuation aerosol inhaler, Disp: , Rfl:    atenolol (TENORMIN) 25 MG tablet, Take 1 tablet (25 mg total) by mouth daily., Disp: 90 tablet, Rfl: 3   busPIRone (BUSPAR) 10 MG tablet, buspirone 10 mg tablet  TAKE 1/2 TABLET BY MOUTH TWICE DAILY FOR 3 DAYS, THEN TAKE 1 TABLET TWICE DAILY (Patient not taking: Reported on 06/18/2021), Disp: , Rfl:    busPIRone (BUSPAR) 15 MG tablet, Take 1 tablet (15 mg total) by mouth 3 (three) times daily., Disp: 270 tablet, Rfl: 0   cyclobenzaprine (FLEXERIL) 10 MG tablet, Take 10 mg by mouth as needed for muscle spasms., Disp: , Rfl:    diltiazem (TIADYLT ER) 120 MG 24 hr capsule, TAKE 1 CAPSULE BY MOUTH EVERY DAY, Disp: 90 capsule, Rfl: 3   docusate sodium (COLACE) 100 MG capsule, Take 1 capsule (100 mg total) by mouth 2 (two) times daily., Disp: 28 capsule, Rfl: 0   FEMRING 0.05 MG/24HR RING, INSERT VAGINALLY AS DIRECTED EVERY 3 MONTHS, Disp: 1 each, Rfl: 2   fluticasone (FLONASE) 50 MCG/ACT nasal spray, USE TWO SPRAYS IN EACH NOSTRIL  ONCE DAILY, Disp: 48 mL, Rfl: 1   hydrALAZINE (APRESOLINE) 25 MG tablet, TAKE 1 TABLET BY MOUTH TWICE A DAY, Disp: 60 tablet, Rfl: 0   HYDROcodone-acetaminophen (NORCO) 10-325 MG tablet, Take 1 tablet by mouth as needed., Disp: , Rfl:    ipratropium (ATROVENT) 0.06 % nasal spray, ipratropium bromide 42 mcg (0.06 %) nasal spray, Disp: , Rfl:    lisinopril (ZESTRIL) 40 MG tablet, Take 1 tablet (40 mg total) by mouth daily., Disp: 90 tablet, Rfl: 3   loratadine (CLARITIN) 10 MG tablet, Take 1 tablet (10 mg total) by mouth daily., Disp: 90 tablet, Rfl: 1   pantoprazole (PROTONIX) 40 MG tablet, Take 1 tablet (40 mg total) by mouth daily as needed (acid reflux)., Disp: 90 tablet,  Rfl: 3   polyethylene glycol (MIRALAX / GLYCOLAX) 17 g packet, Take 17 g by mouth 2 (two) times daily., Disp: 28 packet, Rfl: 0   tiZANidine (ZANAFLEX) 2 MG tablet, Take 2 mg by mouth 2 (two) times daily., Disp: , Rfl:    traZODone (DESYREL) 50 MG tablet, Take 0.5-1.5 tablets (25-75 mg total) by mouth at bedtime as needed for sleep., Disp: 135 tablet, Rfl: 3 Social History   Socioeconomic History   Marital status: Divorced    Spouse name: Not on file   Number of children: 2   Years of education: Not on file   Highest education level: Not on file  Occupational History   Not on file  Tobacco Use   Smoking status: Former    Packs/day: 4.00    Years: 3.00    Total pack years: 12.00    Types: Cigarettes    Start date: 06/10/1960    Quit date: 02/20/1964    Years since quitting: 57.5   Smokeless tobacco: Never  Vaping Use   Vaping Use: Never used  Substance and Sexual Activity   Alcohol use: No    Alcohol/week: 0.0 standard drinks of alcohol   Drug use: No   Sexual activity: Not Currently    Birth control/protection: None  Other Topics Concern   Not on file  Social History Narrative   ** Merged History Encounter **       ** Merged History Encounter **  [name] lives alone. She is separated/divorced from her second husband and widowed by her first husband. She has twin daughters, Kaitlyn Serrano and Kaitlyn Serrano. Kaitlyn Serrano has been a cause of stress for her and she believes was preven   ting her from getting medical care that she needed. She is no longer talking to [place]. She states that Kaitlyn Serrano is involved in her care and that she trusts her to have her best interest in mind.      Social Determinants of Health   Financial Resource Strain: Low Risk  (03/28/2021)   Overall Financial Resource Strain (CARDIA)    Difficulty of Paying Living Expenses: Not hard at all  Food Insecurity: No Food Insecurity (03/28/2021)   Hunger Vital Sign    Worried About Running Out of Food in the Last Year: Never  true    Ran Out of Food in the Last Year: Never true  Transportation Needs: No Transportation Needs (03/28/2021)   PRAPARE - Hydrologist (Medical): No    Lack of Transportation (Non-Medical): No  Physical Activity: Insufficiently Active (03/28/2021)   Exercise Vital Sign    Days of Exercise per Week: 5 days    Minutes of Exercise per Session: 20 min  Stress: No  Stress Concern Present (03/28/2021)   [place]    Feeling of Stress : Not at all  Social Connections: Moderately Integrated (03/28/2021)   Social Connection and Isolation Panel [NHANES]    Frequency of Communication with Friends and Family: More than three times a week    Frequency of Social Gatherings with Friends and Family: More than three times a week    Attends Religious Services: 1 to 4 times per year    Active Member of [employer] or Organizations: Yes    Attends Archivist Meetings: 1 to 4 times per year    Marital Status: Divorced  Human resources officer Violence: Not At Risk (03/28/2021)   Humiliation, Afraid, Rape, and Kick questionnaire    Fear of Current or Ex-Partner: No    Emotionally Abused: No    Physically Abused: No    Sexually Abused: No   Family History  Problem Relation Age of Onset   Diabetes Mother    Heart disease Mother    Hypertension Mother    Gallbladder disease Mother    Heart disease Father    Pneumonia Sister    Breast cancer Paternal Grandmother    Hypertension Brother    Hyperlipidemia Brother    Hypertension Child    Gallbladder disease Other     Objective: Office vital signs reviewed. BP (!) 160/77   Pulse 80   Temp 98 F (36.7 C)   Ht '5\' 2"'$  (1.575 m)   Wt 115 lb 12.8 oz (52.5 kg)   SpO2 97%   BMI 21.18 kg/m   Physical Examination:  General: Awake, alert, nontoxic elderly female, No acute distress HEENT: sclera white, MMM, poor dentition. Missing several teeth on bottom Cardio:  regular rate and rhythm, S1S2 heard, no murmurs appreciated Pulm: clear to auscultation bilaterally, no wheezes, rhonchi or rales; normal work of breathing on room air Neuro:  see below     08/09/2021    3:20 PM 12/17/2019    3:32 PM 08/20/2019    8:42 AM  MMSE - Mini Mental State Exam  Orientation to time '3 5 3  '$ Orientation to Place 0 5 5  Registration 3 3 0  Attention/ Calculation 5 2 0  Recall 0 2 2  Language- name 2 objects '2 2 2  '$ Language- repeat 1 1 0  Language- follow 3 step command '3 3 3  '$ Language- read & follow direction '1 1 1  '$ Write a sentence 1 1 0  Copy design 0 1 0  Total score '19 26 16     '$ Assessment/ Plan: 81 y.o. female   Moderate dementia with agitation, unspecified dementia type (HCC) - Plan: donepezil (ARICEPT) 5 MG tablet, DISCONTINUED: donepezil (ARICEPT) 5 MG tablet  Tested positive for dementia.  Given recent reports I think it is absolutely appropriate to start Aricept.  We discussed that this medication would not reverse memory loss but would hopefully delay further progression.  Would like to reassess this patient again in 2 months  No orders of the defined types were placed in this encounter.  No orders of the defined types were placed in this encounter.    Kaitlyn Norlander, DO [place] [phone]

## 2021-08-09 NOTE — Patient Instructions (Signed)
Donazepil '5mg'$  daily sent for memory. Your test was consistent with a memory loss  Memory Compensation Strategies  Use "WARM" strategy.  W= write it down  A= associate it  R= repeat it  M= make a mental note  2.   You can keep a Social worker.  Use a 3-ring notebook with sections for the following: calendar, important names and phone numbers,  medications, doctors' names/phone numbers, lists/reminders, and a section to journal what you did  each day.   3.    Use a calendar to write appointments down.  4.    Write yourself a schedule for the day.  This can be placed on the calendar or in a separate section of the Memory Notebook.  Keeping a  regular schedule can help memory.  5.    Use medication organizer with sections for each day or morning/evening pills.  You may need help loading it  6.    Keep a basket, or pegboard by the door.  Place items that you need to take out with you in the basket or on the pegboard.  You may also want to  include a message board for reminders.  7.    Use sticky notes.  Place sticky notes with reminders in a place where the task is performed.  For example: " turn off the  stove" placed by the stove, "lock the door" placed on the door at eye level, " take your medications" on  the bathroom mirror or by the place where you normally take your medications.  8.    Use alarms/timers.  Use while cooking to remind yourself to check on food or as a reminder to take your medicine, or as a  reminder to make a call, or as a reminder to perform another task, etc.

## 2021-08-29 DIAGNOSIS — M5106 Intervertebral disc disorders with myelopathy, lumbar region: Secondary | ICD-10-CM | POA: Diagnosis not present

## 2021-08-29 DIAGNOSIS — G894 Chronic pain syndrome: Secondary | ICD-10-CM | POA: Diagnosis not present

## 2021-08-29 DIAGNOSIS — M7918 Myalgia, other site: Secondary | ICD-10-CM | POA: Diagnosis not present

## 2021-09-10 ENCOUNTER — Encounter: Payer: Medicare HMO | Admitting: Family Medicine

## 2021-09-10 ENCOUNTER — Ambulatory Visit: Payer: Medicare HMO | Admitting: Family Medicine

## 2021-10-03 DIAGNOSIS — M791 Myalgia, unspecified site: Secondary | ICD-10-CM | POA: Diagnosis not present

## 2021-10-03 DIAGNOSIS — M5106 Intervertebral disc disorders with myelopathy, lumbar region: Secondary | ICD-10-CM | POA: Diagnosis not present

## 2021-10-03 DIAGNOSIS — Z79891 Long term (current) use of opiate analgesic: Secondary | ICD-10-CM | POA: Diagnosis not present

## 2021-10-03 DIAGNOSIS — Z79899 Other long term (current) drug therapy: Secondary | ICD-10-CM | POA: Diagnosis not present

## 2021-10-03 DIAGNOSIS — G894 Chronic pain syndrome: Secondary | ICD-10-CM | POA: Diagnosis not present

## 2021-10-16 ENCOUNTER — Telehealth: Payer: Self-pay | Admitting: Family Medicine

## 2021-10-16 NOTE — Telephone Encounter (Signed)
  Prescription Request  10/16/2021  Is this a "Controlled Substance" medicine? no  Have you seen your PCP in the last 2 weeks? 08/09/2021  If YES, route message to pool  -  If NO, patient needs to be scheduled for appointment.  What is the name of the medication or equipment? Daughter called in and said pt needs a FILMRING called in  Have you contacted your pharmacy to request a refill? no   Which pharmacy would you like this sent to? [facility]   Patient notified that their request is being sent to the clinical staff for review and that they should receive a response within 2 business days.

## 2021-10-16 NOTE — Telephone Encounter (Signed)
Attempted to contact - NA 

## 2021-10-23 ENCOUNTER — Other Ambulatory Visit: Payer: Self-pay | Admitting: Family Medicine

## 2021-10-23 NOTE — Telephone Encounter (Signed)
She is overdue for follow up on her memory.  Please make sure she has an appt.

## 2021-10-24 ENCOUNTER — Encounter: Payer: Self-pay | Admitting: Family Medicine

## 2021-10-24 NOTE — Telephone Encounter (Signed)
Nvm. LETTER MAILED

## 2021-10-24 NOTE — Telephone Encounter (Signed)
Please inform that insurance not covering. Recommend following up with OB/GYN given advanced age/ dementia.  Likely can come off ring.

## 2021-10-24 NOTE — Telephone Encounter (Signed)
[facility] Last OV 08/09/21 RTC 2 mos, please make her an appt per Dr. Darnell Level below.

## 2021-10-24 NOTE — Telephone Encounter (Signed)
Prescription sent to pharmacy yesterday, it is non-formulary and an alternative is being requested.

## 2021-10-25 NOTE — Telephone Encounter (Signed)
I spoke to pt's daughter and informed her of provider feedback and she voiced understanding.

## 2021-10-26 NOTE — Telephone Encounter (Signed)
No answer, no voicemail.  Prescription was sent on 10/23/21.  Encounter closed

## 2021-11-08 DIAGNOSIS — M5106 Intervertebral disc disorders with myelopathy, lumbar region: Secondary | ICD-10-CM | POA: Diagnosis not present

## 2021-11-08 DIAGNOSIS — G894 Chronic pain syndrome: Secondary | ICD-10-CM | POA: Diagnosis not present

## 2021-11-08 DIAGNOSIS — Z79899 Other long term (current) drug therapy: Secondary | ICD-10-CM | POA: Diagnosis not present

## 2021-11-08 DIAGNOSIS — Z79891 Long term (current) use of opiate analgesic: Secondary | ICD-10-CM | POA: Diagnosis not present

## 2021-11-08 DIAGNOSIS — M791 Myalgia, unspecified site: Secondary | ICD-10-CM | POA: Diagnosis not present

## 2021-11-19 ENCOUNTER — Ambulatory Visit (INDEPENDENT_AMBULATORY_CARE_PROVIDER_SITE_OTHER): Payer: Medicare Other | Admitting: Family Medicine

## 2021-11-19 ENCOUNTER — Ambulatory Visit (INDEPENDENT_AMBULATORY_CARE_PROVIDER_SITE_OTHER): Payer: Medicare Other

## 2021-11-19 ENCOUNTER — Encounter: Payer: Self-pay | Admitting: Family Medicine

## 2021-11-19 VITALS — BP 171/68 | HR 57 | Temp 97.5°F | Ht 62.0 in | Wt 107.2 lb

## 2021-11-19 DIAGNOSIS — M25552 Pain in left hip: Secondary | ICD-10-CM | POA: Diagnosis not present

## 2021-11-19 DIAGNOSIS — Z23 Encounter for immunization: Secondary | ICD-10-CM | POA: Diagnosis not present

## 2021-11-19 DIAGNOSIS — M1612 Unilateral primary osteoarthritis, left hip: Secondary | ICD-10-CM

## 2021-11-19 MED ORDER — CELECOXIB 200 MG PO CAPS: 200.0000 mg | ORAL_CAPSULE | Freq: Every day | ORAL | 5 refills | Status: DC

## 2021-11-19 NOTE — Progress Notes (Signed)
Subjective:  Patient ID: Kaitlyn Serrano, female    DOB: [birth year]  Age: 81 y.o. MRN: [id number]  CC: No chief complaint on file.   HPI ASHLEY BULTEMA presents for left hip pain for a long time. Can't bend over. Had right hip replacement. Hasn't had left evaluated before. Pain is moderate. Does't interfere with sleep, but does interfere with activities due to diminished ROM.      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   Her family history includes Breast cancer in her paternal grandmother; Diabetes in her mother; Gallbladder disease in her mother and another family member; Heart disease in her father and mother; Hyperlipidemia in  her brother; Hypertension in her brother, child, and mother; Pneumonia in her sister.She reports that she quit smoking about 57 years ago. Her smoking use included cigarettes. She started smoking about 61 years ago. She has a 12.00 pack-year smoking history. She has never used smokeless tobacco. She reports that she does not drink alcohol and does not use drugs.    ROS Review of Systems  Constitutional: Negative.   HENT: Negative.    Eyes:  Negative for visual disturbance.  Respiratory:  Negative for shortness of breath.   Cardiovascular:  Negative for chest pain.  Gastrointestinal:  Negative for abdominal pain.  Musculoskeletal:  Negative for arthralgias.    Objective:  BP (!) 171/68   Pulse (!) 57   Temp (!) 97.5 F (36.4 C)   Ht _0  (1.575 m)   Wt 107 lb 3.2 oz (48.6 kg)   SpO2 96%   BMI 19.61 kg/m   BP Readings from Last 3 Encounters:  11/19/21 (!) 171/68  08/09/21 (!) 160/77  06/18/21 (!) 149/80    Wt Readings from Last 3 Encounters:  11/19/21 107 lb 3.2 oz (48.6 kg)  08/09/21 115 lb 12.8 oz (52.5 kg)  06/18/21 120 lb 12.8 oz (54.8 kg)     Physical Exam Constitutional:      General: She is not in acute distress.    Appearance: She is well-developed.  Cardiovascular:     Rate and Rhythm: Normal rate and regular rhythm.  Pulmonary:     Breath sounds: Normal breath sounds.  Musculoskeletal:        General: Tenderness (left  greater trochanter) present. Normal range of motion.  Skin:    General: Skin is warm and dry.  Neurological:     Mental Status: She is alert and oriented to person, place, and time.       Assessment & Plan:   Diagnoses and all orders for this visit:  Arthritis of left hip -     CMP14+EGFR -     CBC with Differential/Platelet -     DG HIP UNILAT W OR W/O PELVIS 2-3 VIEWS LEFT; Future  Other orders -     celecoxib (CELEBREX) 200 MG capsule; Take 1 capsule (200 mg total) by mouth daily. With food       I am having Kaitlyn Serrano start on celecoxib. I am also having her maintain her tiZANidine, docusate sodium, polyethylene glycol, acetaminophen, cyclobenzaprine, HYDROcodone-acetaminophen, albuterol, busPIRone, ipratropium, hydrALAZINE, fluticasone, atenolol, busPIRone, diltiazem, lisinopril, loratadine, pantoprazole, traZODone, donepezil, and Femring.  Allergies as of 11/19/2021       Reactions   Bee Venom Anaphylaxis   Hydrocodone Swelling   Penicillins Anaphylaxis, Rash   Has patient had a PCN reaction causing immediate rash, facial/tongue/throat swelling, SOB or lightheadedness with hypotension: Yes Has patient had a PCN reaction causing severe rash involving mucus membranes or skin necrosis: Yes Has patient had a PCN reaction that required hospitalization: Yes Has patient had a PCN reaction occurring within the last 10 years: No If all of the above answers are "NO", then may proceed with Cephalosporin use.   Amlodipine Swelling   Elavil [amitriptyline Hcl] Other (See Comments)   Confusion, hallucinations   Statins Swelling, Other (See Comments)   Peeling of skin        Medication List        Accurate as of November 19, 2021  3:59 PM. If you have any questions, ask your nurse or doctor.          acetaminophen 500 MG tablet Commonly known as: TYLENOL Take 2 tablets (1,000 mg total) by mouth every 8 (eight) hours.   albuterol 108 (90 Base) MCG/ACT inhaler Commonly known as: VENTOLIN HFA albuterol sulfate HFA 90 mcg/actuation aerosol inhaler   atenolol 25 MG tablet Commonly known as: TENORMIN Take 1 tablet (25 mg total) by mouth daily.   busPIRone 10 MG tablet Commonly known as: BUSPAR buspirone 10 mg tablet  TAKE 1/2 TABLET BY MOUTH TWICE DAILY FOR 3 DAYS, THEN TAKE 1 TABLET TWICE DAILY   busPIRone 15 MG tablet Commonly known as: BUSPAR Take 1 tablet (15 mg total) by mouth 3 (three) times daily.   celecoxib 200 MG capsule Commonly known as: CeleBREX Take 1 capsule (200 mg  total) by mouth daily. With food Started by: Claretta Fraise, MD   cyclobenzaprine 10 MG tablet Commonly known as: FLEXERIL Take 10 mg by mouth as needed for muscle spasms.   diltiazem 120 MG 24 hr capsule Commonly known as: Tiadylt ER TAKE 1 CAPSULE BY MOUTH EVERY DAY   docusate sodium 100 MG capsule Commonly known as: Colace Take 1 capsule (100 mg total) by mouth 2 (two) times daily.   donepezil 5 MG tablet Commonly known as: ARICEPT Take 1 tablet (5 mg total) by mouth at bedtime. For dementia   Femring 0.05 MG/24HR Ring Generic drug: Estradiol Acetate INSERT VAGINALLY AS DIRECTED EVERY 3 MONTHS   fluticasone 50 MCG/ACT nasal spray Commonly known as: FLONASE USE TWO SPRAYS IN EACH NOSTRIL ONCE DAILY   hydrALAZINE 25 MG tablet Commonly known as: APRESOLINE TAKE  1 TABLET BY MOUTH TWICE A DAY   HYDROcodone-acetaminophen 10-325 MG tablet Commonly known as: NORCO Take 1 tablet by mouth as needed.   ipratropium 0.06 % nasal spray Commonly known as: ATROVENT ipratropium bromide 42 mcg (0.06 %) nasal spray   lisinopril 40 MG tablet Commonly known as: ZESTRIL Take 1 tablet (40 mg total) by mouth daily.   loratadine 10 MG tablet Commonly known as: CLARITIN Take 1 tablet (10 mg total) by mouth daily.   pantoprazole 40 MG tablet Commonly known as: PROTONIX Take 1 tablet (40 mg total) by mouth daily as needed (acid reflux).   polyethylene glycol 17 g packet Commonly known as: MIRALAX / GLYCOLAX Take 17 g by mouth 2 (two) times daily.   tiZANidine 2 MG tablet Commonly known as: ZANAFLEX Take 2 mg by mouth 2 (two) times daily.   traZODone 50 MG tablet Commonly known as: DESYREL Take 0.5-1.5 tablets (25-75 mg total) by mouth at bedtime as needed for sleep.       Defer chronic illnesses to Dr. Lajuana Ripple. eGFR =82. Will do trial of Celebrex.  Follow-up: Return in about 6 weeks (around 12/31/2021) for hypertension, with PCP, Dr. Lajuana Ripple.  Claretta Fraise, M.D.

## 2021-11-20 ENCOUNTER — Other Ambulatory Visit: Payer: Self-pay | Admitting: Family Medicine

## 2021-11-20 DIAGNOSIS — F03B11 Unspecified dementia, moderate, with agitation: Secondary | ICD-10-CM

## 2021-11-22 ENCOUNTER — Other Ambulatory Visit: Payer: Self-pay | Admitting: Family Medicine

## 2021-12-10 DIAGNOSIS — G894 Chronic pain syndrome: Secondary | ICD-10-CM | POA: Diagnosis not present

## 2021-12-10 DIAGNOSIS — M5106 Intervertebral disc disorders with myelopathy, lumbar region: Secondary | ICD-10-CM | POA: Diagnosis not present

## 2021-12-10 DIAGNOSIS — Z6823 Body mass index (BMI) 23.0-23.9, adult: Secondary | ICD-10-CM | POA: Diagnosis not present

## 2021-12-10 DIAGNOSIS — M791 Myalgia, unspecified site: Secondary | ICD-10-CM | POA: Diagnosis not present

## 2021-12-24 ENCOUNTER — Other Ambulatory Visit: Payer: Self-pay | Admitting: Family Medicine

## 2021-12-24 DIAGNOSIS — K219 Gastro-esophageal reflux disease without esophagitis: Secondary | ICD-10-CM

## 2021-12-25 ENCOUNTER — Other Ambulatory Visit: Payer: Self-pay | Admitting: Family Medicine

## 2022-01-14 DIAGNOSIS — M791 Myalgia, unspecified site: Secondary | ICD-10-CM | POA: Diagnosis not present

## 2022-01-14 DIAGNOSIS — M5106 Intervertebral disc disorders with myelopathy, lumbar region: Secondary | ICD-10-CM | POA: Diagnosis not present

## 2022-01-14 DIAGNOSIS — G894 Chronic pain syndrome: Secondary | ICD-10-CM | POA: Diagnosis not present

## 2022-02-04 ENCOUNTER — Ambulatory Visit (INDEPENDENT_AMBULATORY_CARE_PROVIDER_SITE_OTHER): Payer: Medicare Other | Admitting: Family Medicine

## 2022-02-04 ENCOUNTER — Encounter: Payer: Self-pay | Admitting: Family Medicine

## 2022-02-04 VITALS — BP 195/92 | HR 88 | Ht 62.0 in | Wt 106.0 lb

## 2022-02-04 DIAGNOSIS — R399 Unspecified symptoms and signs involving the genitourinary system: Secondary | ICD-10-CM

## 2022-02-04 LAB — URINALYSIS
Bilirubin, UA: NEGATIVE
Glucose, UA: NEGATIVE
Nitrite, UA: POSITIVE — AB
Specific Gravity, UA: 1.02 (ref 1.005–1.030)
Urobilinogen, Ur: 0.2 mg/dL (ref 0.2–1.0)
pH, UA: 6.5 (ref 5.0–7.5)

## 2022-02-04 MED ORDER — SULFAMETHOXAZOLE-TRIMETHOPRIM 800-160 MG PO TABS: 1.0000 | ORAL_TABLET | Freq: Two times a day (BID) | ORAL | 0 refills | Status: DC

## 2022-02-04 NOTE — Progress Notes (Signed)
BP (!) 195/92   Pulse 88   Ht '5\' 2"'$  (1.575 m)   Wt 106 lb (48.1 kg)   SpO2 95%   BMI 19.39 kg/m    Subjective:   Patient ID: Kaitlyn Serrano, female    DOB: 1940-09-04, 81 y.o.   MRN: [id number]  HPI: Kaitlyn Serrano is a 81 y.o. female presenting on 02/04/2022 for Urinary Tract Infection   HPI Patient is coming in with urinary complaints Patient has had a few days worth of urinary complaints and lower abdominal pain on the left side and flank pain on the left side as well that is been going on.  She says she has had burning and frequency as well.  She tried to increase hydration but it has not seemed to help.  She has had UTIs before but not too frequently.  Relevant past medical, surgical, family and social history reviewed and updated as indicated. Interim medical history since our last visit reviewed. Allergies and medications reviewed and updated.  Review of Systems  Constitutional:  Negative for chills and fever.  Eyes:  Negative for redness and visual disturbance.  Respiratory:  Negative for chest tightness and shortness of breath.   Cardiovascular:  Negative for chest pain and leg swelling.  Gastrointestinal:  Positive for abdominal pain.  Genitourinary:  Positive for dysuria, flank pain, frequency and hematuria. Negative for difficulty urinating.  Musculoskeletal:  Negative for back pain and gait problem.  Skin:  Negative for rash.  Neurological:  Negative for light-headedness and headaches.  Psychiatric/Behavioral:  Negative for agitation and behavioral problems.   All other systems reviewed and are negative.   Per HPI unless specifically indicated above   Allergies as of 02/04/2022       Reactions   Bee Venom Anaphylaxis   Hydrocodone Swelling   Penicillins Anaphylaxis, Rash   Has patient had a PCN reaction causing immediate rash, facial/tongue/throat swelling, SOB or lightheadedness with hypotension: Yes Has patient had a PCN reaction causing severe rash  involving mucus membranes or skin necrosis: Yes Has patient had a PCN reaction that required hospitalization: Yes Has patient had a PCN reaction occurring within the last 10 years: No If all of the above answers are "NO", then may proceed with Cephalosporin use.   Amlodipine Swelling   Elavil [amitriptyline Hcl] Other (See Comments)   Confusion, hallucinations   Statins Swelling, Other (See Comments)   Peeling of skin        Medication List        Accurate as of February 04, 2022 11:01 AM. If you have any questions, ask your nurse or doctor.          acetaminophen 500 MG tablet Commonly known as: TYLENOL Take 2 tablets (1,000 mg total) by mouth every 8 (eight) hours.   albuterol 108 (90 Base) MCG/ACT inhaler Commonly known as: VENTOLIN HFA albuterol sulfate HFA 90 mcg/actuation aerosol inhaler   atenolol 25 MG tablet Commonly known as: TENORMIN Take 1 tablet (25 mg total) by mouth daily.   busPIRone 10 MG tablet Commonly known as: BUSPAR   busPIRone 15 MG tablet Commonly known as: BUSPAR Take 1 tablet (15 mg total) by mouth 3 (three) times daily.   celecoxib 200 MG capsule Commonly known as: CeleBREX Take 1 capsule (200 mg total) by mouth daily. With food   cyclobenzaprine 10 MG tablet Commonly known as: FLEXERIL Take 10 mg by mouth as needed for muscle spasms.   diltiazem 120 MG 24 hr  capsule Commonly known as: Tiadylt ER TAKE 1 CAPSULE BY MOUTH EVERY DAY   docusate sodium 100 MG capsule Commonly known as: Colace Take 1 capsule (100 mg total) by mouth 2 (two) times daily.   donepezil 5 MG tablet Commonly known as: ARICEPT Take 1 tablet (5 mg total) by mouth at bedtime. For dementia (NEEDS TO BE SEEN BEFORE NEXT REFILL)   Femring 0.05 MG/24HR Ring Generic drug: Estradiol Acetate INSERT VAGINALLY AS DIRECTED EVERY 3 MONTHS   fluticasone 50 MCG/ACT nasal spray Commonly known as: FLONASE USE TWO SPRAYS IN EACH NOSTRIL ONCE DAILY   hydrALAZINE 25 MG  tablet Commonly known as: APRESOLINE TAKE 1 TABLET BY MOUTH TWICE A DAY   HYDROcodone-acetaminophen 10-325 MG tablet Commonly known as: NORCO Take 1 tablet by mouth as needed.   ipratropium 0.06 % nasal spray Commonly known as: ATROVENT ipratropium bromide 42 mcg (0.06 %) nasal spray   lisinopril 40 MG tablet Commonly known as: ZESTRIL Take 1 tablet (40 mg total) by mouth daily.   loratadine 10 MG tablet Commonly known as: CLARITIN Take 1 tablet by mouth once daily   pantoprazole 40 MG tablet Commonly known as: PROTONIX TAKE 1 TABLET BY MOUTH ONCE DAILY AS NEEDED FOR ACID REFLUX   polyethylene glycol 17 g packet Commonly known as: MIRALAX / GLYCOLAX Take 17 g by mouth 2 (two) times daily.   sulfamethoxazole-trimethoprim 800-160 MG tablet Commonly known as: BACTRIM DS Take 1 tablet by mouth 2 (two) times daily. Started by: Fransisca Kaufmann Sigurd Pugh, MD   tiZANidine 2 MG tablet Commonly known as: ZANAFLEX Take 2 mg by mouth 2 (two) times daily.   traZODone 50 MG tablet Commonly known as: DESYREL Take 0.5-1.5 tablets (25-75 mg total) by mouth at bedtime as needed for sleep.         Objective:   BP (!) 195/92   Pulse 88   Ht '5\' 2"'$  (1.575 m)   Wt 106 lb (48.1 kg)   SpO2 95%   BMI 19.39 kg/m   Wt Readings from Last 3 Encounters:  02/04/22 106 lb (48.1 kg)  11/19/21 107 lb 3.2 oz (48.6 kg)  08/09/21 115 lb 12.8 oz (52.5 kg)    Physical Exam Vitals and nursing note reviewed.  Constitutional:      Appearance: Normal appearance. She is normal weight.  Abdominal:     General: Abdomen is flat. There is no distension.     Palpations: Abdomen is soft.     Tenderness: There is abdominal tenderness in the periumbilical area and left lower quadrant. There is no right CVA tenderness, left CVA tenderness, guarding or rebound.  Neurological:     Mental Status: She is alert.       Assessment & Plan:   Problem List Items Addressed This Visit   None Visit Diagnoses      UTI symptoms    -  Primary   Relevant Medications   sulfamethoxazole-trimethoprim (BACTRIM DS) 800-160 MG tablet   Other Relevant Orders   Urine Culture   Urinalysis     Presents also for her, Bactrim to treat the UTI.  Urinalysis showed nitrite positive and 3+ leukocytes and 1+ blood and trace ketones.  Follow up plan: Return if symptoms worsen or fail to improve.  Counseling provided for all of the vaccine components Orders Placed This Encounter  Procedures   Urine Culture   Urinalysis    Caryl Pina, MD [facility] 02/04/2022, 11:01 AM

## 2022-02-05 DIAGNOSIS — B351 Tinea unguium: Secondary | ICD-10-CM | POA: Diagnosis not present

## 2022-02-05 DIAGNOSIS — M79676 Pain in unspecified toe(s): Secondary | ICD-10-CM | POA: Diagnosis not present

## 2022-02-06 LAB — URINE CULTURE

## 2022-02-26 DIAGNOSIS — M5106 Intervertebral disc disorders with myelopathy, lumbar region: Secondary | ICD-10-CM | POA: Diagnosis not present

## 2022-02-26 DIAGNOSIS — M791 Myalgia, unspecified site: Secondary | ICD-10-CM | POA: Diagnosis not present

## 2022-02-26 DIAGNOSIS — G894 Chronic pain syndrome: Secondary | ICD-10-CM | POA: Diagnosis not present

## 2022-03-03 ENCOUNTER — Other Ambulatory Visit: Payer: Self-pay | Admitting: Family Medicine

## 2022-03-03 DIAGNOSIS — I10 Essential (primary) hypertension: Secondary | ICD-10-CM

## 2022-03-14 ENCOUNTER — Telehealth: Payer: Self-pay | Admitting: Family Medicine

## 2022-03-14 NOTE — Telephone Encounter (Signed)
Pt had a script sent in on 10/23/21 for 1 with 2 refills. Pt is to change these out every 3 months, she should have 1 more on file at the pharmacy, instructed that this should be changed out the end of February/first of March. Also that pt needs an appointment in February, last chronic FU was last June.

## 2022-03-14 NOTE — Telephone Encounter (Signed)
  Prescription Request  03/14/2022  Is this a "Controlled Substance" medicine? FEMRING 0.05 MG/24HR RING   Have you seen your PCP in the last 2 weeks? no  If YES, route message to pool  -  If NO, patient needs to be scheduled for appointment.  What is the name of the medication or equipment? Wall 0.05 MG/24HR RING   Have you contacted your pharmacy to request a refill? no   Which pharmacy would you like this sent to? [facility]    Patient notified that their request is being sent to the clinical staff for review and that they should receive a response within 2 business days.

## 2022-03-15 ENCOUNTER — Ambulatory Visit: Payer: Medicare Other | Admitting: Family Medicine

## 2022-03-20 ENCOUNTER — Other Ambulatory Visit: Payer: Self-pay | Admitting: Family Medicine

## 2022-03-20 DIAGNOSIS — F411 Generalized anxiety disorder: Secondary | ICD-10-CM

## 2022-03-27 DIAGNOSIS — M791 Myalgia, unspecified site: Secondary | ICD-10-CM | POA: Diagnosis not present

## 2022-03-27 DIAGNOSIS — M5106 Intervertebral disc disorders with myelopathy, lumbar region: Secondary | ICD-10-CM | POA: Diagnosis not present

## 2022-03-27 DIAGNOSIS — G894 Chronic pain syndrome: Secondary | ICD-10-CM | POA: Diagnosis not present

## 2022-03-27 DIAGNOSIS — I10 Essential (primary) hypertension: Secondary | ICD-10-CM | POA: Diagnosis not present

## 2022-03-27 DIAGNOSIS — Z6823 Body mass index (BMI) 23.0-23.9, adult: Secondary | ICD-10-CM | POA: Diagnosis not present

## 2022-04-08 ENCOUNTER — Telehealth: Payer: Self-pay | Admitting: Family Medicine

## 2022-04-08 ENCOUNTER — Other Ambulatory Visit: Payer: Self-pay | Admitting: Family Medicine

## 2022-04-08 DIAGNOSIS — F411 Generalized anxiety disorder: Secondary | ICD-10-CM

## 2022-04-08 DIAGNOSIS — M1612 Unilateral primary osteoarthritis, left hip: Secondary | ICD-10-CM | POA: Diagnosis not present

## 2022-04-08 DIAGNOSIS — Z96641 Presence of right artificial hip joint: Secondary | ICD-10-CM | POA: Diagnosis not present

## 2022-04-08 DIAGNOSIS — I10 Essential (primary) hypertension: Secondary | ICD-10-CM

## 2022-04-08 NOTE — Telephone Encounter (Signed)
Informed pt that she should have one more refill on file for her Diltiazem & her Trazodone at [facility], she can have [employer] to see if they can get that last refill transferred to them.

## 2022-04-08 NOTE — Telephone Encounter (Signed)
  Prescription Request  04/08/2022  Is this a "Controlled Substance" medicine? no  Have you seen your PCP in the last 2 weeks? no  If YES, route message to pool  -  If NO, patient needs to be scheduled for appointment.  What is the name of the medication or equipment? Diltiazem 120 MG 24 hr capsule  Have you contacted your pharmacy to request a refill? no   Which pharmacy would you like this sent to? [facility]   Patient notified that their request is being sent to the clinical staff for review and that they should receive a response within 2 business days.

## 2022-04-12 ENCOUNTER — Encounter: Payer: Self-pay | Admitting: Family Medicine

## 2022-04-12 ENCOUNTER — Ambulatory Visit (INDEPENDENT_AMBULATORY_CARE_PROVIDER_SITE_OTHER): Payer: Medicare HMO | Admitting: Family Medicine

## 2022-04-12 ENCOUNTER — Other Ambulatory Visit: Payer: Self-pay | Admitting: Family Medicine

## 2022-04-12 VITALS — BP 156/86 | HR 89 | Temp 98.7°F | Ht 62.0 in | Wt 100.8 lb

## 2022-04-12 DIAGNOSIS — I252 Old myocardial infarction: Secondary | ICD-10-CM

## 2022-04-12 DIAGNOSIS — F03B11 Unspecified dementia, moderate, with agitation: Secondary | ICD-10-CM | POA: Diagnosis not present

## 2022-04-12 DIAGNOSIS — F411 Generalized anxiety disorder: Secondary | ICD-10-CM | POA: Diagnosis not present

## 2022-04-12 DIAGNOSIS — I10 Essential (primary) hypertension: Secondary | ICD-10-CM

## 2022-04-12 DIAGNOSIS — Z01818 Encounter for other preprocedural examination: Secondary | ICD-10-CM

## 2022-04-12 LAB — URINALYSIS
Bilirubin, UA: NEGATIVE
Glucose, UA: NEGATIVE
Ketones, UA: NEGATIVE
Leukocytes,UA: NEGATIVE
Nitrite, UA: NEGATIVE
Protein,UA: NEGATIVE
RBC, UA: NEGATIVE
Specific Gravity, UA: 1.015 (ref 1.005–1.030)
Urobilinogen, Ur: 0.2 mg/dL (ref 0.2–1.0)
pH, UA: 5.5 (ref 5.0–7.5)

## 2022-04-12 LAB — COAGUCHEK XS/INR WAIVED
INR: 1 (ref 0.9–1.1)
Prothrombin Time: 11.5 s

## 2022-04-12 MED ORDER — ATENOLOL 25 MG PO TABS: 25.0000 mg | ORAL_TABLET | Freq: Every day | ORAL | 3 refills | Status: DC

## 2022-04-12 MED ORDER — DONEPEZIL HCL 5 MG PO TABS: 5.0000 mg | ORAL_TABLET | Freq: Every day | ORAL | 3 refills | Status: DC

## 2022-04-12 MED ORDER — DILTIAZEM HCL ER BEADS 120 MG PO CP24: ORAL_CAPSULE | ORAL | 3 refills | Status: DC

## 2022-04-12 MED ORDER — TRAZODONE HCL 50 MG PO TABS: 25.0000 mg | ORAL_TABLET | Freq: Every evening | ORAL | 3 refills | Status: DC | PRN

## 2022-04-12 MED ORDER — LISINOPRIL 40 MG PO TABS: 40.0000 mg | ORAL_TABLET | Freq: Every day | ORAL | 3 refills | Status: DC

## 2022-04-12 MED ORDER — BUSPIRONE HCL 15 MG PO TABS: 15.0000 mg | ORAL_TABLET | Freq: Two times a day (BID) | ORAL | 3 refills | Status: DC

## 2022-04-12 NOTE — Patient Instructions (Signed)
Your pain doctor just sent in #120 of your hydrocodone to the pharmacy.  Check with them.  It should be available for pick up.  If you are having pain THROUGH the medications they are prescribing, you will need to call them to adjust the med.  You are under a pain contract with them so I cannot give you any more pain medications.  Call Dr Harl Bowie to get cardiac clearance for your surgery  Call Dr Alvan Dame to send me the form to clear you.

## 2022-04-12 NOTE — Progress Notes (Signed)
Pt is a 82 y.o. female who is here for preoperative clearance for left hip replacement with Dr Kaitlyn Serrano.  Scheduled 04/2022.  Denies history of adverse events after previous hip surgery on the right.  She has not had any postop nausea or vomiting or difficulty arousing from sedation.  She is not sure if they are doing a spinal or general sedation for this procedure.  She is accompanied today's visit by her daughter.  Patient does report that she is having pain despite use of her Norco which is prescribed by her pain provider 4 times daily.  She has not let them know about the upcoming surgery yet.  She has not reached out to her cardiologist for cardiac clearance yet either  1) High Risk Cardiac Conditions  1) Recent MI - No. Has h/o MI.  Sees Dr Harl Bowie.  2) Decompensated Heart Failure - No.  3) Unstable angina - No.  4) Symptomatic arrythmia - No.  5) Sx Valvular Disease - No.  2) Intermediate Risk Factors - CAD - Yes.    2) Functional Status - > 4 mets (Walk, run, climb stairs) No. Limited by hip  3) Surgery Specific Risk - Intermediate (orthopedic)  4) Further Noninvasive evaluation -   1) EKG - Yes.   Will see Cardiology for clearance   1) Hx of CVA, CAD, DM, CKD  2) Echo - No.   1) Worsening dyspnea   5) Need for medical therapy - Beta Blocker, Statins indicated ? No.  PE: Vitals:   04/12/22 1307  BP: (!) 156/86  Pulse: 89  Temp: 98.7 F (37.1 C)  SpO2: 100%   Physical Examination: General appearance - alert, well appearing, and in no distress and normal appearing weight Mental status - pleasant and interactive Mouth - mucous membranes moist, pharynx normal without lesions, Mallampati 1 Neck -able to extend C-spine without difficulty Chest - clear to auscultation, no wheezes, rales or rhonchi, symmetric air entry Heart - normal rate, regular rhythm, normal S1, S2, no murmurs, rubs, clicks or gallops Musculoskeletal -antalgic gait.  Arrives in wheelchair  Preop examination -  Plan: CMP14+EGFR, CoaguChek XS/INR Waived, CBC, Urinalysis  Essential hypertension - Plan: atenolol (TENORMIN) 25 MG tablet, diltiazem (TIADYLT ER) 120 MG 24 hr capsule, lisinopril (ZESTRIL) 40 MG tablet, Ambulatory referral to Cardiology, CMP14+EGFR, CoaguChek XS/INR Waived, CBC, Urinalysis  History of MI (myocardial infarction) - Plan: Ambulatory referral to Cardiology, CMP14+EGFR, CoaguChek XS/INR Waived, CBC, Urinalysis  Moderate dementia with agitation, unspecified dementia type (HCC) - Plan: donepezil (ARICEPT) 5 MG tablet  GAD (generalized anxiety disorder) - Plan: busPIRone (BUSPAR) 15 MG tablet  Pending labs, no apparent contraindication to proceeding with operation from a medical standpoint.  I did inform them they need to get cardiac clearance with Dr. Harl Bowie given history of MI.  Blood pressure is not well-controlled but I think this is due to lapse in medication.  Medications have been renewed.  Check CMP, INR and CBC in preparation for preop.  I advised them to get Dr. Aurea Graff office to send me the preop form send may complete this.  I will defer all pain medication to her pain specialist and discussed with her that she should contact their office in preparation for this upcoming surgery as she will most certainly need higher levels of treatment given her baseline need for opioids.  I have independently evaluated patient.  Kaitlyn Serrano is a 82 y.o. female who is intermediate risk for a intermediate risk surgery.  There  are not modifiable risk factors (smoking, etc). Kaitlyn Serrano's RCRI calculation for MACE is: 6%.    Kaitlyn Serrano M. Lajuana Ripple, [facility]

## 2022-04-13 LAB — CMP14+EGFR
ALT: 5 IU/L (ref 0–32)
AST: 15 IU/L (ref 0–40)
Albumin/Globulin Ratio: 1.5 (ref 1.2–2.2)
Albumin: 4 g/dL (ref 3.7–4.7)
Alkaline Phosphatase: 96 IU/L (ref 44–121)
BUN/Creatinine Ratio: 20 (ref 12–28)
BUN: 13 mg/dL (ref 8–27)
Bilirubin Total: 0.3 mg/dL (ref 0.0–1.2)
CO2: 25 mmol/L (ref 20–29)
Calcium: 9.5 mg/dL (ref 8.7–10.3)
Chloride: 97 mmol/L (ref 96–106)
Creatinine, Ser: 0.64 mg/dL (ref 0.57–1.00)
Globulin, Total: 2.6 g/dL (ref 1.5–4.5)
Glucose: 129 mg/dL — ABNORMAL HIGH (ref 70–99)
Potassium: 4.3 mmol/L (ref 3.5–5.2)
Sodium: 136 mmol/L (ref 134–144)
Total Protein: 6.6 g/dL (ref 6.0–8.5)
eGFR: 89 mL/min/{1.73_m2} (ref >59)

## 2022-04-13 LAB — CBC
Hematocrit: 41.8 % (ref 34.0–46.6)
Hemoglobin: 14 g/dL (ref 11.1–15.9)
MCH: 30.1 pg (ref 26.6–33.0)
MCHC: 33.5 g/dL (ref 31.5–35.7)
MCV: 90 fL (ref 79–97)
Platelets: 322 10*3/uL (ref 150–450)
RBC: 4.65 x10E6/uL (ref 3.77–5.28)
RDW: 12.8 % (ref 11.7–15.4)
WBC: 10.5 10*3/uL (ref 3.4–10.8)

## 2022-04-15 ENCOUNTER — Telehealth: Payer: Self-pay | Admitting: Family Medicine

## 2022-04-15 ENCOUNTER — Telehealth: Payer: Self-pay | Admitting: Cardiology

## 2022-04-15 NOTE — Telephone Encounter (Signed)
Primary Cardiologist:Branch, Roderic Palau, MD  Chart reviewed as part of pre-operative protocol coverage. Because of Kaitlyn Serrano past medical history and time since last visit, he/she will require a follow-up visit in order to better assess preoperative cardiovascular risk.  Pre-op covering staff: - Please schedule appointment and call patient to inform them. - Please contact requesting surgeon's office via preferred method (i.e, phone, fax) to inform them of need for appointment prior to surgery.  Emmaline Life, NP-C  04/15/2022, 1:52 PM [address], [address] Office [phone] Fax [phone]

## 2022-04-15 NOTE — Telephone Encounter (Signed)
   Pre-operative Risk Assessment    Patient Name: STEPHANIEANN GILLAND  DOB: [birth year] MRN: [id number]      Request for Surgical Clearance    Procedure:   Left total hip arthroplasty  Date of Surgery:  Clearance 05/14/22                                 Surgeon:  Dr. Paralee Cancel Surgeon's Group or Practice Name:  [facility] Phone number:  [phone] Fax number:  [phone]   Type of Clearance Requested:   - Medical  - Pharmacy:  Hold        Type of Anesthesia:  Spinal   Additional requests/questions:    SignedHipolito Bayley   04/15/2022, 1:32 PM

## 2022-04-15 NOTE — Telephone Encounter (Signed)
Appointment cancelled, patient does not need to be seen again, patient aware.

## 2022-04-15 NOTE — Telephone Encounter (Signed)
S/w pt's daughter per (DPR). Appointment is made for Wednesday, February 21 with Seward Speck.

## 2022-04-16 ENCOUNTER — Ambulatory Visit: Payer: Medicare Other

## 2022-04-17 ENCOUNTER — Ambulatory Visit: Payer: Medicare HMO | Attending: Nurse Practitioner | Admitting: Nurse Practitioner

## 2022-04-17 ENCOUNTER — Encounter: Payer: Self-pay | Admitting: Nurse Practitioner

## 2022-04-17 VITALS — BP 134/74 | HR 78 | Ht 62.0 in | Wt 103.8 lb

## 2022-04-17 DIAGNOSIS — Z01810 Encounter for preprocedural cardiovascular examination: Secondary | ICD-10-CM

## 2022-04-17 DIAGNOSIS — Z01818 Encounter for other preprocedural examination: Secondary | ICD-10-CM

## 2022-04-17 DIAGNOSIS — E119 Type 2 diabetes mellitus without complications: Secondary | ICD-10-CM | POA: Diagnosis not present

## 2022-04-17 DIAGNOSIS — I2510 Atherosclerotic heart disease of native coronary artery without angina pectoris: Secondary | ICD-10-CM

## 2022-04-17 DIAGNOSIS — E782 Mixed hyperlipidemia: Secondary | ICD-10-CM

## 2022-04-17 DIAGNOSIS — R002 Palpitations: Secondary | ICD-10-CM

## 2022-04-17 DIAGNOSIS — I10 Essential (primary) hypertension: Secondary | ICD-10-CM | POA: Diagnosis not present

## 2022-04-17 DIAGNOSIS — I509 Heart failure, unspecified: Secondary | ICD-10-CM | POA: Diagnosis not present

## 2022-04-17 NOTE — Progress Notes (Signed)
Cardiology Office Note:    Date:  04/17/2022  ID:  FREE REISSIG, DOB Apr 12, 1940, MRN [id number]  PCP:  Janora Norlander, DO   [facility] Cardiologist:  Carlyle Dolly, MD     Referring MD: Janora Norlander, DO   CC: Here for follow-up  History of Present Illness:    MCKENLY BENCOMO is a 82 y.o. female with a hx of the following:  CAD History of palpitations CHF Hypertension Hyperlipidemia Type 2 diabetes  Patient is a very pleasant 82 year old female with past medical history as mentioned above.  Last seen by Levell July, NP on June 08, 2020 for 36-monthfollow-up.  Noted some mild shortness of breath occasionally, was not very active.  Overall was doing well.  At previous office visit, hydralazine was added to medication regimen, blood pressure was elevated today as she had not taken hydralazine at that time. Was advised to give office a call if BP was not at goal. She was started on Hydralazine 25 mg BID. Was told to f/u in 6 months.   Today she is here for pending left hip arthroplasty. Surgery date is 05/14/2022. Surgery will be performed by Dr. MParalee Cancelof [facility]. Today she states she is doing well. Denies any chest pain, shortness of breath, palpitations, syncope, presyncope, dizziness, orthopnea, PND, swelling or significant weight changes, acute bleeding, or claudication.   Past Medical History:  Diagnosis Date   [place]od[place]   Chronic pain syndrome    Complication of anesthesia    Diverticulosis    Duplex kidney 01/12/08   normal. family unaware   Dysphagia    Dyspnea    Generalized weakness    GERD (gastroesophageal reflux disease)    GERD (gastroesophageal reflux disease)    High cholesterol    Hoarseness    Hx of cardiovascular stress test    negative 2012 and 2016   Hypertension    Insomnia    Intervertebral disc disorder with myelopathy, lumbar region    Lumbago    Lumbar spine  pain    PONV (postoperative nausea and vomiting)    confusion after anesthesia   Renal insufficiency    Ringing in ears    resolved   Spondylosis with myelopathy, lumbar region    legs, back    Past Surgical History:  Procedure Laterality Date   ABDOMINAL HYSTERECTOMY     BACK SURGERY     BONE MARROW ASPIRATION     CERVICAL SPINE SURGERY     EYE SURGERY Bilateral    Lasic   FIXATION KYPHOPLASTY     LAMINOTOMY     SPINAL FUSION     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   Procedure: TOTAL HIP ARTHROPLASTY ANTERIOR APPROACH;  Surgeon: OParalee Cancel MD;  Location: [facility];  Service: Orthopedics;  Laterality: Right;  70 mins    Current Medications: Current Meds  Medication Sig   acetaminophen (TYLENOL) 500 MG tablet Take 2 tablets (1,000 mg total) by mouth every 8 (eight) hours.   atenolol (TENORMIN) 25 MG tablet Take 1 tablet (25 mg total) by mouth daily.   busPIRone (BUSPAR) 15 MG tablet Take 1 tablet (15 mg total) by mouth 2 (two) times daily.   celecoxib (CELEBREX) 200 MG capsule TAKE ONE CAPSULE DAILY WITH FOOD   cyclobenzaprine (FLEXERIL) 10 MG tablet Take 10 mg by mouth as needed for muscle spasms.   diltiazem (TIADYLT ER) 120 MG 24 hr capsule TAKE 1 CAPSULE BY  MOUTH EVERY DAY   docusate sodium (COLACE) 100 MG capsule Take 1 capsule (100 mg total) by mouth 2 (two) times daily.   donepezil (ARICEPT) 5 MG tablet Take 1 tablet (5 mg total) by mouth at bedtime. For dementia   FEMRING 0.05 MG/24HR RING INSERT VAGINALLY AS DIRECTED EVERY 3 MONTHS   fluticasone (FLONASE) 50 MCG/ACT nasal spray USE TWO SPRAYS IN EACH NOSTRIL ONCE DAILY   HYDROcodone-acetaminophen (NORCO) 10-325 MG tablet Take 1 tablet by mouth as needed.   ipratropium (ATROVENT) 0.06 % nasal spray ipratropium bromide 42 mcg (0.06 %) nasal spray   lisinopril (ZESTRIL) 40 MG tablet Take 1 tablet (40 mg total) by mouth daily.   loratadine (CLARITIN) 10 MG tablet Take 1 tablet by mouth once daily   pantoprazole  (PROTONIX) 40 MG tablet TAKE 1 TABLET BY MOUTH ONCE DAILY AS NEEDED FOR ACID REFLUX   polyethylene glycol (MIRALAX / GLYCOLAX) 17 g packet Take 17 g by mouth 2 (two) times daily.   traZODone (DESYREL) 50 MG tablet Take 0.5-1.5 tablets (25-75 mg total) by mouth at bedtime as needed for sleep.     Allergies:   Bee venom, Hydrocodone, Penicillins, Amlodipine, Elavil [amitriptyline hcl], and Statins   Social History   Socioeconomic History   Marital status: Divorced    Spouse name: Not on file   Number of children: 2   Years of education: Not on file   Highest education level: Not on file  Occupational History   Not on file  Tobacco Use   Smoking status: Former    Packs/day: 4.00    Years: 3.00    Total pack years: 12.00    Types: Cigarettes    Start date: 06/10/1960    Quit date: 02/20/1964    Years since quitting: 58.2   Smokeless tobacco: Never  Vaping Use   Vaping Use: Never used  Substance and Sexual Activity   Alcohol use: No    Alcohol/week: 0.0 standard drinks of alcohol   Drug use: No   Sexual activity: Not Currently    Birth control/protection: None  Other Topics Concern   Not on file  Social History Narrative   ** Merged History Encounter **       ** Merged History Encounter **  Ms [name] lives alone. She is separated/divorced from her second husband and widowed by her first husband. She has twin daughters, [name] and [name]. [name] has been a cause of stress for her and she believes was preven   ting her from getting medical care that she needed. She is no longer talking to [facility]. She states that [name] is involved in her care and that she trusts her to have her best interest in mind.      Social Determinants of Health   Financial Resource Strain: Low Risk  (03/28/2021)   Overall Financial Resource Strain (CARDIA)    Difficulty of Paying Living Expenses: Not hard at all  Food Insecurity: No Food Insecurity (03/28/2021)   Hunger Vital Sign    Worried About  Running Out of Food in the Last Year: Never true    Ran Out of Food in the Last Year: Never true  Transportation Needs: No Transportation Needs (03/28/2021)   PRAPARE - Hydrologist (Medical): No    Lack of Transportation (Non-Medical): No  Physical Activity: Insufficiently Active (03/28/2021)   Exercise Vital Sign    Days of Exercise per Week: 5 days    Minutes of Exercise  per Session: 20 min  Stress: No Stress Concern Present (03/28/2021)   [place]    Feeling of Stress : Not at all  Social Connections: Moderately Integrated (03/28/2021)   Social Connection and Isolation Panel [NHANES]    Frequency of Communication with Friends and Family: More than three times a week    Frequency of Social Gatherings with Friends and Family: More than three times a week    Attends Religious Services: 1 to 4 times per year    Active Member of [employer] or Organizations: Yes    Attends Archivist Meetings: 1 to 4 times per year    Marital Status: Divorced     Family History: The patient's family history includes Breast cancer in her paternal grandmother; Diabetes in her mother; Gallbladder disease in her mother and another family member; Heart disease in her father and mother; Hyperlipidemia in her brother; Hypertension in her brother, child, and mother; Pneumonia in her sister.  ROS:   Please see the history of present illness.     All other systems reviewed and are negative.  EKGs/Labs/Other Studies Reviewed:    The following studies were reviewed today:   EKG:  EKG is ordered today.  The ekg ordered today demonstrates NSR, 78 bpm, nonspecific ST/T wave segment changes, otherwise nothing acute.   Echocardiogram on 09/08/2014: Study Conclusions   - Left ventricle: The cavity size was normal. Wall thickness was    normal. Systolic function was normal. The estimated ejection    fraction was in the  range of 60% to 65%. Wall motion was normal;    there were no regional wall motion abnormalities. Doppler    parameters are consistent with abnormal left ventricular    relaxation (grade 1 diastolic dysfunction).  - Aortic valve: Mildly calcified annulus. Trileaflet.  - Mitral valve: Mildly calcified annulus. Mildly calcified leaflets    . There was mild regurgitation.  - Tricuspid valve: There was mild regurgitation.   Lexiscan on 06/08/2014: IMPRESSION: 1. No reversible ischemia or infarction.   2. Normal left ventricular wall motion.   3. Left ventricular ejection fraction 57%   4. Low-risk stress test findings*.   Recent Labs: 04/12/2022: ALT 5; BUN 13; Creatinine, Ser 0.64; Hemoglobin 14.0; Platelets 322; Potassium 4.3; Sodium 136  Recent Lipid Panel    Component Value Date/Time   CHOL 200 (H) 04/19/2022 1136   TRIG 179 (H) 04/19/2022 1136   HDL 59 04/19/2022 1136   CHOLHDL 3.4 04/19/2022 1136   LDLCALC 110 (H) 04/19/2022 1136    Physical Exam:    VS:  BP 134/74   Pulse 78   Ht '5\' 2"'$  (1.575 m)   Wt 103 lb 12.8 oz (47.1 kg)   SpO2 96%   BMI 18.99 kg/m     Wt Readings from Last 3 Encounters:  04/17/22 103 lb 12.8 oz (47.1 kg)  04/12/22 100 lb 12.8 oz (45.7 kg)  02/04/22 106 lb (48.1 kg)     GEN:  Well nourished, well developed in no acute distress HEENT: Normal NECK: No JVD; No carotid bruits CARDIAC: S1/S2, RRR, no murmurs, rubs, gallops RESPIRATORY:  Clear to auscultation without rales, wheezing or rhonchi  MUSCULOSKELETAL:  No edema; No deformity  SKIN: Warm and dry NEUROLOGIC:  Alert and oriented x 3 PSYCHIATRIC:  Normal affect   ASSESSMENT:    1. Pre-operative cardiovascular examination   2. Coronary artery disease involving native coronary artery of native heart  without angina pectoris   3. Essential hypertension   4. Mixed hyperlipidemia   5. Palpitations    PLAN:    In order of problems listed above:  Pre-operative Cardiovascular Risk  Assessment Ms. Carkhuff perioperative risk of a major cardiac event is 6.6% according to the Revised Cardiac Risk Index (RCRI).  Therefore, she is at high risk for perioperative complications.   Her functional capacity is fair at 4.64 METs according to the Duke Activity Status Index (DASI). Recommendations: According to ACC/AHA guidelines, no further cardiovascular testing needed.  The patient may proceed to surgery at acceptable risk.   Antiplatelet and/or Anticoagulation Recommendations: Patient is not on any antiplatelet or anticoagulation that needs to be held prior to surgery. Does not require SBE prophylaxis. Discussed case with Dr. Carlyle Dolly who agrees that patient may proceed to surgery. Patient was made aware of RCRI risk and she verbalized understanding and was agreeable to proceed. Will route note to requesting party.   2. CAD Stable with no anginal symptoms. No indication for ischemic evaluation. Continue current medication regimen. Heart healthy diet and regular cardiovascular exercise encouraged.   3. HTN BP stable today. BP well controlled at home. Discussed to monitor BP at home at least 2 hours after medications and sitting for 5-10 minutes. Heart healthy diet and regular cardiovascular exercise encouraged.   4. HLD LDL 1 year ago 157. Not at goal. Will repeat lipid panel. If LDL is not at goal, will discuss referral to [facility] as she has hx of statin intolerance. Heart healthy diet and regular cardiovascular exercise encouraged.   5. Palpitations Denies any palpitations. Continue current meds. Heart healthy diet and regular cardiovascular exercise encouraged.   6. Disposition: Follow-up with Dr. Carlyle Dolly in 6 months or sooner if anything changes.    Medication Adjustments/Labs and Tests Ordered: Current medicines are reviewed at length with the patient today.  Concerns regarding medicines are outlined above.  Orders Placed This Encounter  Procedures    Lipid panel   EKG 12-Lead   No orders of the defined types were placed in this encounter.   Patient Instructions  Medication Instructions:   Your physician recommends that you continue on your current medications as directed. Please refer to the Current Medication list given to you today.  *If you need a refill on your cardiac medications before your next appointment, please call your pharmacy*  Lab Work: Your physician recommends that you return for lab work in 2 weeks:  Fasting Lipid Panel-DO NOT eat or drink past midnight. Okay to have water and/or black coffee only the morning of labs.   If you have labs (blood work) drawn today and your tests are completely normal, you will receive your results only by: [place] (if you have MyChart) OR A paper copy in the mail If you have any lab test that is abnormal or we need to change your treatment, we will call you to review the results.  Testing/Procedures: NONE ordered at this time of appointment   Follow-Up: At [facility], you and your health needs are our priority.  As part of our continuing mission to provide you with exceptional heart care, we have created designated Provider Care Teams.  These Care Teams include your primary Cardiologist (physician) and Advanced Practice Providers (APPs -  Physician Assistants and Nurse Practitioners) who all work together to provide you with the care you need, when you need it.  Your next appointment:   6 month(s)  Provider:  Carlyle Dolly, MD    Other Instructions  Mediterranean Diet  A Mediterranean diet refers to food and lifestyle choices that are based on the traditions of countries located on the [place]. It focuses on eating more fruits, vegetables, whole grains, beans, nuts, seeds, and heart-healthy fats, and eating less dairy, meat, eggs, and processed foods with added sugar, salt, and fat. This way of eating has been shown to help prevent certain  conditions and improve outcomes for people who have chronic diseases, like kidney disease and heart disease. What are tips for following this plan? Reading food labels Check the serving size of packaged foods. For foods such as rice and pasta, the serving size refers to the amount of cooked product, not dry. Check the total fat in packaged foods. Avoid foods that have saturated fat or trans fats. Check the ingredient list for added sugars, such as corn syrup. Shopping  Buy a variety of foods that offer a balanced diet, including: Fresh fruits and vegetables (produce). Grains, beans, nuts, and seeds. Some of these may be available in unpackaged forms or large amounts (in bulk). Fresh seafood. Poultry and eggs. Low-fat dairy products. Buy whole ingredients instead of prepackaged foods. Buy fresh fruits and vegetables in-season from local farmers markets. Buy plain frozen fruits and vegetables. If you do not have access to quality fresh seafood, buy precooked frozen shrimp or canned fish, such as tuna, salmon, or sardines. Stock your pantry so you always have certain foods on hand, such as olive oil, canned tuna, canned tomatoes, rice, pasta, and beans. Cooking Cook foods with extra-virgin olive oil instead of using butter or other vegetable oils. Have meat as a side dish, and have vegetables or grains as your main dish. This means having meat in small portions or adding small amounts of meat to foods like pasta or stew. Use beans or vegetables instead of meat in common dishes like chili or lasagna. Experiment with different cooking methods. Try roasting, broiling, steaming, and sauting vegetables. Add frozen vegetables to soups, stews, pasta, or rice. Add nuts or seeds for added healthy fats and plant protein at each meal. You can add these to yogurt, salads, or vegetable dishes. Marinate fish or vegetables using olive oil, lemon juice, garlic, and fresh herbs. Meal planning Plan to eat  one vegetarian meal one day each week. Try to work up to two vegetarian meals, if possible. Eat seafood two or more times a week. Have healthy snacks readily available, such as: Vegetable sticks with hummus. Greek yogurt. Fruit and nut trail mix. Eat balanced meals throughout the week. This includes: Fruit: 2-3 servings a day. Vegetables: 4-5 servings a day. Low-fat dairy: 2 servings a day. Fish, poultry, or lean meat: 1 serving a day. Beans and legumes: 2 or more servings a week. Nuts and seeds: 1-2 servings a day. Whole grains: 6-8 servings a day. Extra-virgin olive oil: 3-4 servings a day. Limit red meat and sweets to only a few servings a month. Lifestyle  Cook and eat meals together with your family, when possible. Drink enough fluid to keep your urine pale yellow. Be physically active every day. This includes: Aerobic exercise like running or swimming. Leisure activities like gardening, walking, or housework. Get 7-8 hours of sleep each night. If recommended by your health care provider, drink red wine in moderation. This means 1 glass a day for nonpregnant women and 2 glasses a day for men. A glass of wine equals 5 oz (150 mL). What foods  should I eat? Fruits Apples. Apricots. Avocado. Berries. Bananas. Cherries. Dates. Figs. Grapes. Lemons. Melon. Oranges. Peaches. Plums. Pomegranate. Vegetables Artichokes. Beets. Broccoli. Cabbage. Carrots. Eggplant. Green beans. Chard. Kale. Spinach. Onions. Leeks. Peas. Squash. Tomatoes. Peppers. Radishes. Grains Whole-grain pasta. Brown rice. Bulgur wheat. Polenta. Couscous. Whole-wheat bread. [place]. Meats and other proteins Beans. Almonds. Sunflower seeds. Pine nuts. Peanuts. [place]. Salmon. Scallops. Shrimp. [place]. Tilapia. Clams. Oysters. Eggs. Poultry without skin. Dairy Low-fat milk. Cheese. Greek yogurt. Fats and oils Extra-virgin olive oil. Avocado oil. Grapeseed oil. Beverages Water. Red wine. Herbal tea. Sweets and  desserts Greek yogurt with honey. Baked apples. Poached pears. Trail mix. Seasonings and condiments Basil. Cilantro. Coriander. Cumin. Mint. Parsley. Sage. Rosemary. Tarragon. Garlic. Oregano. Thyme. Pepper. Balsamic vinegar. Tahini. Hummus. Tomato sauce. Olives. Mushrooms. The items listed above may not be a complete list of foods and beverages you can eat. Contact a dietitian for more information. What foods should I limit? This is a list of foods that should be eaten rarely or only on special occasions. Fruits Fruit canned in syrup. Vegetables Deep-fried potatoes (french fries). Grains Prepackaged pasta or rice dishes. Prepackaged cereal with added sugar. Prepackaged snacks with added sugar. Meats and other proteins Beef. Pork. Lamb. Poultry with skin. Hot dogs. [place] Salines. Dairy Ice cream. Sour cream. Whole milk. Fats and oils Butter. Canola oil. Vegetable oil. Beef fat (tallow). Lard. Beverages Juice. Sugar-sweetened soft drinks. Beer. Liquor and spirits. Sweets and desserts Cookies. Cakes. Pies. Candy. Seasonings and condiments Mayonnaise. Pre-made sauces and marinades. The items listed above may not be a complete list of foods and beverages you should limit. Contact a dietitian for more information. Summary The Mediterranean diet includes both food and lifestyle choices. Eat a variety of fresh fruits and vegetables, beans, nuts, seeds, and whole grains. Limit the amount of red meat and sweets that you eat. If recommended by your health care provider, drink red wine in moderation. This means 1 glass a day for nonpregnant women and 2 glasses a day for men. A glass of wine equals 5 oz (150 mL). This information is not intended to replace advice given to you by your health care provider. Make sure you discuss any questions you have with your health care provider. Document Revised: 03/19/2019 Document Reviewed: 01/14/2019 Elsevier Patient Education  2023 [place], [place], [place]  04/20/2022 11:05 AM    [place]

## 2022-04-17 NOTE — Patient Instructions (Addendum)
Medication Instructions:   Your physician recommends that you continue on your current medications as directed. Please refer to the Current Medication list given to you today.  *If you need a refill on your cardiac medications before your next appointment, please call your pharmacy*  Lab Work: Your physician recommends that you return for lab work in 2 weeks:  Fasting Lipid Panel-DO NOT eat or drink past midnight. Okay to have water and/or black coffee only the morning of labs.   If you have labs (blood work) drawn today and your tests are completely normal, you will receive your results only by: [place] (if you have MyChart) OR A paper copy in the mail If you have any lab test that is abnormal or we need to change your treatment, we will call you to review the results.  Testing/Procedures: NONE ordered at this time of appointment   Follow-Up: At [facility], you and your health needs are our priority.  As part of our continuing mission to provide you with exceptional heart care, we have created designated Provider Care Teams.  These Care Teams include your primary Cardiologist (physician) and Advanced Practice Providers (APPs -  Physician Assistants and Nurse Practitioners) who all work together to provide you with the care you need, when you need it.  Your next appointment:   6 month(s)  Provider:   Carlyle Dolly, MD    Other Instructions  Mediterranean Diet  A Mediterranean diet refers to food and lifestyle choices that are based on the traditions of countries located on the [place]. It focuses on eating more fruits, vegetables, whole grains, beans, nuts, seeds, and heart-healthy fats, and eating less dairy, meat, eggs, and processed foods with added sugar, salt, and fat. This way of eating has been shown to help prevent certain conditions and improve outcomes for people who have chronic diseases, like kidney disease and heart disease. What are tips  for following this plan? Reading food labels Check the serving size of packaged foods. For foods such as rice and pasta, the serving size refers to the amount of cooked product, not dry. Check the total fat in packaged foods. Avoid foods that have saturated fat or trans fats. Check the ingredient list for added sugars, such as corn syrup. Shopping  Buy a variety of foods that offer a balanced diet, including: Fresh fruits and vegetables (produce). Grains, beans, nuts, and seeds. Some of these may be available in unpackaged forms or large amounts (in bulk). Fresh seafood. Poultry and eggs. Low-fat dairy products. Buy whole ingredients instead of prepackaged foods. Buy fresh fruits and vegetables in-season from local farmers markets. Buy plain frozen fruits and vegetables. If you do not have access to quality fresh seafood, buy precooked frozen shrimp or canned fish, such as tuna, salmon, or sardines. Stock your pantry so you always have certain foods on hand, such as olive oil, canned tuna, canned tomatoes, rice, pasta, and beans. Cooking Cook foods with extra-virgin olive oil instead of using butter or other vegetable oils. Have meat as a side dish, and have vegetables or grains as your main dish. This means having meat in small portions or adding small amounts of meat to foods like pasta or stew. Use beans or vegetables instead of meat in common dishes like chili or lasagna. Experiment with different cooking methods. Try roasting, broiling, steaming, and sauting vegetables. Add frozen vegetables to soups, stews, pasta, or rice. Add nuts or seeds for added healthy fats and plant protein  at each meal. You can add these to yogurt, salads, or vegetable dishes. Marinate fish or vegetables using olive oil, lemon juice, garlic, and fresh herbs. Meal planning Plan to eat one vegetarian meal one day each week. Try to work up to two vegetarian meals, if possible. Eat seafood two or more times a  week. Have healthy snacks readily available, such as: Vegetable sticks with hummus. Greek yogurt. Fruit and nut trail mix. Eat balanced meals throughout the week. This includes: Fruit: 2-3 servings a day. Vegetables: 4-5 servings a day. Low-fat dairy: 2 servings a day. Fish, poultry, or lean meat: 1 serving a day. Beans and legumes: 2 or more servings a week. Nuts and seeds: 1-2 servings a day. Whole grains: 6-8 servings a day. Extra-virgin olive oil: 3-4 servings a day. Limit red meat and sweets to only a few servings a month. Lifestyle  Cook and eat meals together with your family, when possible. Drink enough fluid to keep your urine pale yellow. Be physically active every day. This includes: Aerobic exercise like running or swimming. Leisure activities like gardening, walking, or housework. Get 7-8 hours of sleep each night. If recommended by your health care provider, drink red wine in moderation. This means 1 glass a day for nonpregnant women and 2 glasses a day for men. A glass of wine equals 5 oz (150 mL). What foods should I eat? Fruits Apples. Apricots. Avocado. Berries. Bananas. Cherries. Dates. Figs. Grapes. Lemons. Melon. Oranges. Peaches. Plums. Pomegranate. Vegetables Artichokes. Beets. Broccoli. Cabbage. Carrots. Eggplant. Green beans. Chard. Kale. Spinach. Onions. Leeks. Peas. Squash. Tomatoes. Peppers. Radishes. Grains Whole-grain pasta. Brown rice. Bulgur wheat. Polenta. Couscous. Whole-wheat bread. [place]. Meats and other proteins Beans. Almonds. Sunflower seeds. Pine nuts. Peanuts. [place]. Salmon. Scallops. Shrimp. [place]. Tilapia. Clams. Oysters. Eggs. Poultry without skin. Dairy Low-fat milk. Cheese. Greek yogurt. Fats and oils Extra-virgin olive oil. Avocado oil. Grapeseed oil. Beverages Water. Red wine. Herbal tea. Sweets and desserts Greek yogurt with honey. Baked apples. Poached pears. Trail mix. Seasonings and condiments Basil. Cilantro.  Coriander. Cumin. Mint. Parsley. Sage. Rosemary. Tarragon. Garlic. Oregano. Thyme. Pepper. Balsamic vinegar. Tahini. Hummus. Tomato sauce. Olives. Mushrooms. The items listed above may not be a complete list of foods and beverages you can eat. Contact a dietitian for more information. What foods should I limit? This is a list of foods that should be eaten rarely or only on special occasions. Fruits Fruit canned in syrup. Vegetables Deep-fried potatoes (french fries). Grains Prepackaged pasta or rice dishes. Prepackaged cereal with added sugar. Prepackaged snacks with added sugar. Meats and other proteins Beef. Pork. Lamb. Poultry with skin. Hot dogs. [place] Salines. Dairy Ice cream. Sour cream. Whole milk. Fats and oils Butter. Canola oil. Vegetable oil. Beef fat (tallow). Lard. Beverages Juice. Sugar-sweetened soft drinks. Beer. Liquor and spirits. Sweets and desserts Cookies. Cakes. Pies. Candy. Seasonings and condiments Mayonnaise. Pre-made sauces and marinades. The items listed above may not be a complete list of foods and beverages you should limit. Contact a dietitian for more information. Summary The Mediterranean diet includes both food and lifestyle choices. Eat a variety of fresh fruits and vegetables, beans, nuts, seeds, and whole grains. Limit the amount of red meat and sweets that you eat. If recommended by your health care provider, drink red wine in moderation. This means 1 glass a day for nonpregnant women and 2 glasses a day for men. A glass of wine equals 5 oz (150 mL). This information is not intended to replace advice given  to you by your health care provider. Make sure you discuss any questions you have with your health care provider. Document Revised: 03/19/2019 Document Reviewed: 01/14/2019 Elsevier Patient Education  [place].

## 2022-04-19 ENCOUNTER — Other Ambulatory Visit: Payer: Medicare HMO

## 2022-04-19 DIAGNOSIS — E782 Mixed hyperlipidemia: Secondary | ICD-10-CM | POA: Diagnosis not present

## 2022-04-20 LAB — LIPID PANEL
Chol/HDL Ratio: 3.4 ratio (ref 0.0–4.4)
Cholesterol, Total: 200 mg/dL — ABNORMAL HIGH (ref 100–199)
HDL: 59 mg/dL (ref >39)
LDL Chol Calc (NIH): 110 mg/dL — ABNORMAL HIGH (ref 0–99)
Triglycerides: 179 mg/dL — ABNORMAL HIGH (ref 0–149)
VLDL Cholesterol Cal: 31 mg/dL (ref 5–40)

## 2022-04-22 ENCOUNTER — Other Ambulatory Visit: Payer: Self-pay | Admitting: Family Medicine

## 2022-04-24 ENCOUNTER — Telehealth: Payer: Self-pay

## 2022-04-24 DIAGNOSIS — G894 Chronic pain syndrome: Secondary | ICD-10-CM | POA: Diagnosis not present

## 2022-04-24 DIAGNOSIS — M791 Myalgia, unspecified site: Secondary | ICD-10-CM | POA: Diagnosis not present

## 2022-04-24 DIAGNOSIS — Z79891 Long term (current) use of opiate analgesic: Secondary | ICD-10-CM | POA: Diagnosis not present

## 2022-04-24 DIAGNOSIS — Z79899 Other long term (current) drug therapy: Secondary | ICD-10-CM

## 2022-04-24 DIAGNOSIS — M5106 Intervertebral disc disorders with myelopathy, lumbar region: Secondary | ICD-10-CM | POA: Diagnosis not present

## 2022-04-24 MED ORDER — EZETIMIBE 10 MG PO TABS: 10.0000 mg | ORAL_TABLET | Freq: Every day | ORAL | 2 refills | Status: DC

## 2022-04-24 NOTE — Telephone Encounter (Signed)
-----   Message from Finis Bud, NP sent at 04/23/2022  5:16 PM EST ----- LDL is still above goal. Let's initiate Zetia 10 mg daily and repeat FLP and LFT in 2 months per protocol.   Best,  Finis Bud, AGNP-C

## 2022-04-24 NOTE — Telephone Encounter (Signed)
Per DPR, daughter sharon made aware, verbalized understanding. Rx for Zetia 10 MG  once a day sent to [facility] as requested. Will have FLP and LFT in 2 months PCP @ [place]. Orders placed for future labs.

## 2022-04-30 NOTE — Patient Instructions (Addendum)
SURGICAL WAITING ROOM VISITATION Patients having surgery or a procedure may have no more than 2 support people in the waiting area - these visitors may rotate in the visitor waiting room.   Due to an increase in RSV and influenza rates and associated hospitalizations, children ages 18 and under may not visit patients in [place]. If the patient needs to stay at the hospital during part of their recovery, the visitor guidelines for inpatient rooms apply.  PRE-OP VISITATION  Pre-op nurse will coordinate an appropriate time for 1 support person to accompany the patient in pre-op.  This support person may not rotate.  This visitor will be contacted when the time is appropriate for the visitor to come back in the pre-op area.  Please refer to the [facility] website for the visitor guidelines for Inpatients (after your surgery is over and you are in a regular room).  You are not required to quarantine at this time prior to your surgery. However, you must do this: Hand Hygiene often Do NOT share personal items Notify your provider if you are in close contact with someone who has COVID or you develop fever 100.4 or greater, new onset of sneezing, cough, sore throat, shortness of breath or body aches.  If you test positive for Covid or have been in contact with anyone that has tested positive in the last 10 days please notify you surgeon.    Your procedure is scheduled on:  Tuesday  May 14, 2022  Report to [facility] Main Entrance: [place] entrance where the [employer] is available.   Report to admitting at:  11:45   AM  +++++Call this number if you have any questions or problems the morning of surgery [phone]  Do not eat food after Midnight the night prior to your surgery/procedure.  After Midnight you may have the following liquids until  11:15  AM DAY OF SURGERY  Clear Liquid Diet Water Black Coffee (sugar ok, NO MILK/CREAM OR CREAMERS)  Tea (sugar ok, NO  MILK/CREAM OR CREAMERS) regular and decaf                             Plain Jell-O  with no fruit (NO RED)                                           Fruit ices (not with fruit pulp, NO RED)                                     Popsicles (NO RED)                                                                  Juice: apple, WHITE grape, WHITE cranberry Sports drinks like Gatorade or Powerade (NO RED)                    The day of surgery:  Drink ONE (1) Pre-Surgery G2 at  11:15    AM the morning of surgery. Drink  in one sitting. Do not sip.  This drink was given to you during your hospital pre-op appointment visit. Nothing else to drink after completing the Pre-Surgery G2 : No candy, chewing gum or throat lozenges.    FOLLOW  ANY ADDITIONAL PRE OP INSTRUCTIONS YOU RECEIVED FROM YOUR SURGEON'S OFFICE!!!   Oral Hygiene is also important to reduce your risk of infection.        Remember - BRUSH YOUR TEETH THE MORNING OF SURGERY WITH YOUR REGULAR TOOTHPASTE  Do NOT smoke after Midnight the night before surgery.  Take ONLY these medicines the morning of surgery with A SIP OF WATER: buspirone (Buspar), loratadine (Claritin), Atenolol (Tenormin) ????                    You may not have any metal on your body including hair pins, jewelry, and body piercing  Do not wear make-up, lotions, powders, perfumes or deodorant  Do not wear nail polish including gel and S&S, artificial / acrylic nails, or any other type of covering on natural nails including finger and toenails. If you have artificial nails, gel coating, etc., that needs to be removed by a nail salon, Please have this removed prior to surgery. Not doing so may mean that your surgery could be cancelled or delayed if the Surgeon or anesthesia staff feels like they are unable to monitor you safely.   Do not shave 48 hours prior to surgery to avoid nicks in your skin which may contribute to postoperative infections.   Contacts, Hearing  Aids, dentures or bridgework may not be worn into surgery. DENTURES WILL BE REMOVED PRIOR TO SURGERY PLEASE DO NOT APPLY "Poly grip" OR ADHESIVES!!!  You may bring a small overnight bag with you on the day of surgery, only pack items that are not valuable. [facility] IS NOT RESPONSIBLE   FOR VALUABLES THAT ARE LOST OR STOLEN.   Do not bring your home medications to the hospital. The Pharmacy will dispense medications listed on your medication list to you during your admission in the Hospital.  Special Instructions: Bring a copy of your healthcare power of attorney and living will documents the day of surgery, if you wish to have them scanned into your [facility] Records- EPIC  Please read over the following fact sheets you were given: IF YOU HAVE QUESTIONS ABOUT YOUR [place], [place] [phone].   [place] - Preparing for Surgery Before surgery, you can play an important role.  Because skin is not sterile, your skin needs to be as free of germs as possible.  You can reduce the number of germs on your skin by washing with CHG (chlorahexidine gluconate) soap before surgery.  CHG is an antiseptic cleaner which kills germs and bonds with the skin to continue killing germs even after washing. Please DO NOT use if you have an allergy to CHG or antibacterial soaps.  If your skin becomes reddened/irritated stop using the CHG and inform your nurse when you arrive at Short Stay. Do not shave (including legs and underarms) for at least 48 hours prior to the first CHG shower.  You may shave your face/neck.  Please follow these instructions carefully:  1.  Shower with CHG Soap the night before surgery and the  morning of surgery.  2.  If you choose to wash your hair, wash your hair first as usual with your normal  shampoo.  3.  After you shampoo, rinse your hair and body thoroughly  to remove the shampoo.                             4.  Use CHG as you would any other liquid soap.   You can apply chg directly to the skin and wash.  Gently with a scrungie or clean washcloth.  5.  Apply the CHG Soap to your body ONLY FROM THE NECK DOWN.   Do not use on face/ open                           Wound or open sores. Avoid contact with eyes, ears mouth and genitals (private parts).                       Wash face,  Genitals (private parts) with your normal soap.             6.  Wash thoroughly, paying special attention to the area where your  surgery  will be performed.  7.  Thoroughly rinse your body with warm water from the neck down.  8.  DO NOT shower/wash with your normal soap after using and rinsing off the CHG Soap.            9.  Pat yourself dry with a clean towel.            10.  Wear clean pajamas.            11.  Place clean sheets on your bed the night of your first shower and do not  sleep with pets.  ON THE DAY OF SURGERY : Do not apply any lotions/deodorants the morning of surgery.  Please wear clean clothes to the hospital/surgery center.    FAILURE TO FOLLOW THESE INSTRUCTIONS MAY RESULT IN THE CANCELLATION OF YOUR SURGERY  PATIENT SIGNATURE_________________________________  NURSE SIGNATURE__________________________________  ________________________________________________________________________       Kaitlyn Serrano    An incentive spirometer is a tool that can help keep your lungs clear and active. This tool measures how well you are filling your lungs with each breath. Taking long deep breaths may help reverse or decrease the chance of developing breathing (pulmonary) problems (especially infection) following: A long period of time when you are unable to move or be active. BEFORE THE PROCEDURE  If the spirometer includes an indicator to show your best effort, your nurse or respiratory therapist will set it to a desired goal. If possible, sit up straight or lean slightly forward. Try not to slouch. Hold the incentive spirometer in an upright  position. INSTRUCTIONS FOR USE  Sit on the edge of your bed if possible, or sit up as far as you can in bed or on a chair. Hold the incentive spirometer in an upright position. Breathe out normally. Place the mouthpiece in your mouth and seal your lips tightly around it. Breathe in slowly and as deeply as possible, raising the piston or the ball toward the top of the column. Hold your breath for 3-5 seconds or for as long as possible. Allow the piston or ball to fall to the bottom of the column. Remove the mouthpiece from your mouth and breathe out normally. Rest for a few seconds and repeat Steps 1 through 7 at least 10 times every 1-2 hours when you are awake. Take your time and take a few normal breaths between deep breaths. The spirometer  may include an indicator to show your best effort. Use the indicator as a goal to work toward during each repetition. After each set of 10 deep breaths, practice coughing to be sure your lungs are clear. If you have an incision (the cut made at the time of surgery), support your incision when coughing by placing a pillow or rolled up towels firmly against it. Once you are able to get out of bed, walk around indoors and cough well. You may stop using the incentive spirometer when instructed by your caregiver.  RISKS AND COMPLICATIONS Take your time so you do not get dizzy or light-headed. If you are in pain, you may need to take or ask for pain medication before doing incentive spirometry. It is harder to take a deep breath if you are having pain. AFTER USE Rest and breathe slowly and easily. It can be helpful to keep track of a log of your progress. Your caregiver can provide you with a simple table to help with this. If you are using the spirometer at home, follow these instructions: [place] IF:  You are having difficultly using the spirometer. You have trouble using the spirometer as often as instructed. Your pain medication is not giving  enough relief while using the spirometer. You develop fever of 100.5 F (38.1 C) or higher.                                                                                                    SEEK IMMEDIATE MEDICAL CARE IF:  You cough up bloody sputum that had not been present before. You develop fever of 102 F (38.9 C) or greater. You develop worsening pain at or near the incision site. MAKE SURE YOU:  Understand these instructions. Will watch your condition. Will get help right away if you are not doing well or get worse. Document Released: 06/24/2006 Document Revised: 05/06/2011 Document Reviewed: 08/25/2006 [facility] Patient Information 2014 [place], [place].     WHAT IS A BLOOD TRANSFUSION? Blood Transfusion Information  A transfusion is the replacement of blood or some of its parts. Blood is made up of multiple cells which provide different functions. Red blood cells carry oxygen and are used for blood loss replacement. White blood cells fight against infection. Platelets control bleeding. Plasma helps clot blood. Other blood products are available for specialized needs, such as hemophilia or other clotting disorders. BEFORE THE TRANSFUSION  Who gives blood for transfusions?  Healthy volunteers who are fully evaluated to make sure their blood is safe. This is blood bank blood. Transfusion therapy is the safest it has ever been in the practice of medicine. Before blood is taken from a donor, a complete history is taken to make sure that person has no history of diseases nor engages in risky social behavior (examples are intravenous drug use or sexual activity with multiple partners). The donor's travel history is screened to minimize risk of transmitting infections, such as malaria. The donated blood is tested for signs of infectious diseases, such as HIV and hepatitis. The blood is then tested to be  sure it is compatible with you in order to minimize the chance of a transfusion  reaction. If you or a relative donates blood, this is often done in anticipation of surgery and is not appropriate for emergency situations. It takes many days to process the donated blood. RISKS AND COMPLICATIONS Although transfusion therapy is very safe and saves many lives, the main dangers of transfusion include:  Getting an infectious disease. Developing a transfusion reaction. This is an allergic reaction to something in the blood you were given. Every precaution is taken to prevent this. The decision to have a blood transfusion has been considered carefully by your caregiver before blood is given. Blood is not given unless the benefits outweigh the risks. AFTER THE TRANSFUSION Right after receiving a blood transfusion, you will usually feel much better and more energetic. This is especially true if your red blood cells have gotten low (anemic). The transfusion raises the level of the red blood cells which carry oxygen, and this usually causes an energy increase. The nurse administering the transfusion will monitor you carefully for complications. HOME CARE INSTRUCTIONS  No special instructions are needed after a transfusion. You may find your energy is better. Speak with your caregiver about any limitations on activity for underlying diseases you may have. SEEK MEDICAL CARE IF:  Your condition is not improving after your transfusion. You develop redness or irritation at the intravenous (IV) site. SEEK IMMEDIATE MEDICAL CARE IF:  Any of the following symptoms occur over the next 12 hours: Shaking chills. You have a temperature by mouth above 102 F (38.9 C), not controlled by medicine. Chest, back, or muscle pain. People around you feel you are not acting correctly or are confused. Shortness of breath or difficulty breathing. Dizziness and fainting. You get a rash or develop hives. You have a decrease in urine output. Your urine turns a dark color or changes to pink, red, or  brown. Any of the following symptoms occur over the next 10 days: You have a temperature by mouth above 102 F (38.9 C), not controlled by medicine. Shortness of breath. Weakness after normal activity. The white part of the eye turns yellow (jaundice). You have a decrease in the amount of urine or are urinating less often. Your urine turns a dark color or changes to pink, red, or brown. Document Released: 02/09/2000 Document Revised: 05/06/2011 Document Reviewed: 09/28/2007 [facility] Patient Information 2014 [place], [place].  _______________________________________________________________________

## 2022-04-30 NOTE — Progress Notes (Signed)
COVID Vaccine received:  '[]'$  No '[x]'$  Yes Date of any COVID positive Test in last 90 days:  PCP - Ronnie Doss, DO Cardiologist - Carlyle Dolly, MD Finis Bud, NP cardiac clearance 04-17-2022  Epic note  Chest x-ray - 02-15-2020  2v  epic EKG -  04-17-2022 epic Stress Test - 2012, 2016  epic ECHO - 09-08-2014  epic Cardiac Cath - no  PCR screen: '[x]'$  Ordered & Completed                      '[]'$   No Order but Needs PROFEND                      '[]'$   N/A for this surgery  Surgery Plan:  '[]'$  Ambulatory                            '[x]'$  Outpatient in bed                            '[]'$  Admit  Anesthesia:    '[]'$  General  '[x]'$  Spinal                           '[]'$   Choice '[]'$   MAC  Pacemaker / ICD device '[x]'$  No '[]'$  Yes        Device order form faxed '[x]'$  No    '[]'$   Yes      Faxed to:  Spinal Cord Stimulator:'[x]'$  No '[]'$  Yes      (Remind patient to bring remote DOS) Other Implants:   History of Sleep Apnea? '[x]'$  No '[]'$  Yes   CPAP used?- '[x]'$  No '[]'$  Yes    Does the patient monitor blood sugar? '[]'$  No '[]'$  Yes  '[]'$  N/A  Patient has: '[x]'$  Pre-DM   '[]'$  DM1  '[]'$   DM2 Does patient have a [employer] or Dexacom? '[]'$  No '[]'$  Yes   Fasting Blood Sugar Ranges-  Checks Blood Sugar _____ times a day  GLP1 agonist-  GLP1 instructions:  SGLT-2 inhibitors-  SGLT-2 instructions:   Other Diabetic medications/ instructions:   Blood Thinner / Instructions: None Aspirin Instructions: none  ERAS Protocol Ordered: '[]'$  No  '[x]'$  Yes PRE-SURGERY '[]'$  ENSURE  '[x]'$  G2  Patient is to be NPO after: 11:15 am  Comments:   Activity level: Patient is able / unable to climb a flight of stairs without difficulty; '[]'$  No CP  '[]'$  No SOB, but would have ___   Patient can / can not perform ADLs without assistance.   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 confusion with anesthesia, HTN, GAD, GERD, Pre-DM   Patient denies shortness of breath, fever, cough and chest pain at PAT appointment.  Patient  verbalized understanding and agreement to the Pre-Surgical Instructions that were given to them at this PAT appointment. Patient was also educated of the need to review these PAT instructions again prior to his/her surgery.I reviewed the appropriate phone numbers to call if they have any and questions or concerns.

## 2022-05-01 ENCOUNTER — Encounter (HOSPITAL_COMMUNITY)
Admission: RE | Admit: 2022-05-01 | Discharge: 2022-05-01 | Disposition: A | Discharge status: Home / Self Care | Payer: Medicare HMO | Source: Ambulatory Visit | Attending: Orthopedic Surgery | Admitting: Orthopedic Surgery

## 2022-05-01 DIAGNOSIS — I10 Essential (primary) hypertension: Secondary | ICD-10-CM

## 2022-05-01 DIAGNOSIS — Z01818 Encounter for other preprocedural examination: Secondary | ICD-10-CM

## 2022-05-01 DIAGNOSIS — R7303 Prediabetes: Secondary | ICD-10-CM

## 2022-05-01 NOTE — Patient Instructions (Signed)
SURGICAL WAITING ROOM VISITATION  Patients having surgery or a procedure may have no more than 2 support people in the waiting area - these visitors may rotate.    Children under the age of 77 must have an adult with them who is not the patient.  Due to an increase in RSV and influenza rates and associated hospitalizations, children ages 36 and under may not visit patients in [place].  If the patient needs to stay at the hospital during part of their recovery, the visitor guidelines for inpatient rooms apply. Pre-op nurse will coordinate an appropriate time for 1 support person to accompany patient in pre-op.  This support person may not rotate.    Please refer to the [facility] website for the visitor guidelines for Inpatients (after your surgery is over and you are in a regular room).       Your procedure is scheduled on:  05/14/2022    Report to [facility] Main Entrance    Report to admitting at   1200 noon    Call this number if you have problems the morning of surgery [phone]   Do not eat food :After Midnight.   After Midnight you may have the following liquids until _ 1115_____ AM  DAY OF SURGERY  Water Non-Citrus Juices (without pulp, NO RED-Apple, White grape, White cranberry) Black Coffee (NO MILK/CREAM OR CREAMERS, sugar ok)  Clear Tea (NO MILK/CREAM OR CREAMERS, sugar ok) regular and decaf                             Plain Jell-O (NO RED)                                           Fruit ices (not with fruit pulp, NO RED)                                     Popsicles (NO RED)                                                               Sports drinks like Gatorade (NO RED)                    The day of surgery:  Drink ONE (1) Pre-Surgery Clear Ensure or G2 at  1115 AM   ( have completed by ) the morning of surgery. Drink in one sitting. Do not sip.  This drink was given to you during your hospital  pre-op appointment visit. Nothing else  to drink after completing the  Pre-Surgery Clear Ensure or G2.          If you have questions, please contact your surgeon's office.      Oral Hygiene is also important to reduce your risk of infection.                                    Remember - BRUSH YOUR TEETH THE MORNING  OF SURGERY WITH YOUR REGULAR TOOTHPASTE  DENTURES WILL BE REMOVED PRIOR TO SURGERY PLEASE DO NOT APPLY "Poly grip" OR ADHESIVES!!!   Do NOT smoke after Midnight   Take these medicines the morning of surgery with A SIP OF WATER:  atenolol, buspar, diltiazem, flonase, claritin, protonix if needed   DO NOT TAKE ANY ORAL DIABETIC MEDICATIONS DAY OF YOUR SURGERY  Bring CPAP mask and tubing day of surgery.                              You may not have any metal on your body including hair pins, jewelry, and body piercing             Do not wear make-up, lotions, powders, perfumes/cologne, or deodorant  Do not wear nail polish including gel and S&S, artificial/acrylic nails, or any other type of covering on natural nails including finger and toenails. If you have artificial nails, gel coating, etc. that needs to be removed by a nail salon please have this removed prior to surgery or surgery may need to be canceled/ delayed if the surgeon/ anesthesia feels like they are unable to be safely monitored.   Do not shave  48 hours prior to surgery.               Men may shave face and neck.   Do not bring valuables to the hospital. [place].   Contacts, glasses, dentures or bridgework may not be worn into surgery.   Bring small overnight bag day of surgery.   DO NOT [place]. PHARMACY WILL DISPENSE MEDICATIONS LISTED ON YOUR MEDICATION LIST TO YOU DURING YOUR ADMISSION [place]!    Patients discharged on the day of surgery will not be allowed to drive home.  Someone NEEDS to stay with you for the first 24 hours after  anesthesia.   Special Instructions: Bring a copy of your healthcare power of attorney and living will documents the day of surgery if you haven't scanned them before.              Please read over the following fact sheets you were given: IF [place] [phone]   If you received a COVID test during your pre-op visit  it is requested that you wear a mask when out in public, stay away from anyone that may not be feeling well and notify your surgeon if you develop symptoms. If you test positive for Covid or have been in contact with anyone that has tested positive in the last 10 days please notify you surgeon.    [place] - Preparing for Surgery Before surgery, you can play an important role.  Because skin is not sterile, your skin needs to be as free of germs as possible.  You can reduce the number of germs on your skin by washing with CHG (chlorahexidine gluconate) soap before surgery.  CHG is an antiseptic cleaner which kills germs and bonds with the skin to continue killing germs even after washing. Please DO NOT use if you have an allergy to CHG or antibacterial soaps.  If your skin becomes reddened/irritated stop using the CHG and inform your nurse when you arrive at Short Stay. Do not shave (including legs and underarms) for  at least 48 hours prior to the first CHG shower.  You may shave your face/neck. Please follow these instructions carefully:  1.  Shower with CHG Soap the night before surgery and the  morning of Surgery.  2.  If you choose to wash your hair, wash your hair first as usual with your  normal  shampoo.  3.  After you shampoo, rinse your hair and body thoroughly to remove the  shampoo.                           4.  Use CHG as you would any other liquid soap.  You can apply chg directly  to the skin and wash                       Gently with a scrungie or clean washcloth.  5.  Apply the CHG Soap to your body ONLY FROM THE  NECK DOWN.   Do not use on face/ open                           Wound or open sores. Avoid contact with eyes, ears mouth and genitals (private parts).                       Wash face,  Genitals (private parts) with your normal soap.             6.  Wash thoroughly, paying special attention to the area where your surgery  will be performed.  7.  Thoroughly rinse your body with warm water from the neck down.  8.  DO NOT shower/wash with your normal soap after using and rinsing off  the CHG Soap.                9.  Pat yourself dry with a clean towel.            10.  Wear clean pajamas.            11.  Place clean sheets on your bed the night of your first shower and do not  sleep with pets. Day of Surgery : Do not apply any lotions/deodorants the morning of surgery.  Please wear clean clothes to the hospital/surgery center.  FAILURE TO FOLLOW THESE INSTRUCTIONS MAY RESULT IN THE CANCELLATION OF YOUR SURGERY PATIENT SIGNATURE_________________________________  NURSE SIGNATURE__________________________________  ________________________________________________________________________

## 2022-05-01 NOTE — Progress Notes (Signed)
Anesthesia Review:  PCP: Ronnie Doss LOV 04/12/22  Cardiologist : Finis Bud, NP LOV 04/17/22  Chest x-ray : EKG : 04/17/22  Echo : 216  Stress test: 2016  Cardiac Cath :  Activity level:  Sleep Study/ CPAP : Fasting Blood Sugar :      / Checks Blood Sugar -- times a day:   Blood Thinner/ Instructions /Last Dose: ASA / Instructions/ Last Dose :

## 2022-05-03 ENCOUNTER — Inpatient Hospital Stay (HOSPITAL_COMMUNITY)
Admission: RE | Admit: 2022-05-03 | Discharge: 2022-05-03 | Disposition: A | Discharge status: Home / Self Care | Payer: Self-pay | Source: Ambulatory Visit

## 2022-05-06 ENCOUNTER — Ambulatory Visit (INDEPENDENT_AMBULATORY_CARE_PROVIDER_SITE_OTHER): Payer: Medicare HMO | Admitting: Nurse Practitioner

## 2022-05-06 ENCOUNTER — Encounter: Payer: Self-pay | Admitting: Nurse Practitioner

## 2022-05-06 VITALS — BP 196/88 | HR 72 | Temp 97.3°F | Resp 20 | Ht 62.0 in | Wt 107.0 lb

## 2022-05-06 DIAGNOSIS — H5789 Other specified disorders of eye and adnexa: Secondary | ICD-10-CM | POA: Diagnosis not present

## 2022-05-06 DIAGNOSIS — F411 Generalized anxiety disorder: Secondary | ICD-10-CM

## 2022-05-06 NOTE — Progress Notes (Signed)
Subjective:    Patient ID: Kaitlyn Serrano, female    DOB: [birth year], 82 y.o.   MRN: [id number]   Chief Complaint: Feels like something in both eyes and Anxiety   Anxiety Patient reports no chest pain, dizziness, palpitations or shortness of breath.     Patient felt like her eyes had a film all over them. She washed them out last night and they feel better today.  Her anxiety is increasing- her buspar is not helping    05/06/2022   11:02 AM 08/09/2021    3:21 PM 01/23/2021    8:30 AM 12/05/2020    1:17 PM  GAD 7 : Generalized Anxiety Score  Nervous, Anxious, on Edge '3 2 1 1  '$ Control/stop worrying '3 2 1 1  '$ Worry too much - different things '3 2 1 3  '$ Trouble relaxing '3 3 1 1  '$ Restless 3 2 0 1  Easily annoyed or irritable 3 2 0 2  Afraid - awful might happen 3 3 0 0  Total GAD 7 Score '21 16 4 9  '$ Anxiety Difficulty Extremely difficult Somewhat difficult Somewhat difficult Somewhat difficult     Patient Active Problem List   Diagnosis Date Noted   Moderate episode of recurrent major depressive disorder ([place]) 03/27/2020   Sinus congestion 03/27/2020   S/P right THA, 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  Eyes:  Negative for pain.  Respiratory:  Negative for shortness of breath.   Cardiovascular:  Negative for chest pain, palpitations and leg swelling.  Gastrointestinal:  Negative for abdominal pain.  Endocrine: Negative for polydipsia.  Skin:  Negative for rash.   Neurological:  Negative for dizziness, weakness and headaches.  Hematological:  Does not bruise/bleed easily.  All other systems reviewed and are negative.      Objective:   Physical Exam Vitals reviewed.  Constitutional:      Appearance: Normal appearance.  Eyes:     Extraocular Movements: Extraocular movements intact.     Pupils: Pupils are equal, round, and reactive to light.  Cardiovascular:     Rate and Rhythm: Normal rate and regular rhythm.     Heart sounds: Normal heart sounds.  Pulmonary:     Effort: Pulmonary effort is normal.     Breath sounds: Normal breath sounds.  Skin:    General: Skin is warm.  Neurological:     General: No focal deficit present.     Mental Status: She is alert and oriented to person, place, and time.  Psychiatric:        Mood and Affect: Mood normal.        Behavior: Behavior normal.    BP (!) 196/88   Pulse 72   Temp (!) 97.3 F (36.3 C) (Temporal)   Resp 20   Ht '5\' 2"'$  (1.575 m)   Wt 107 lb (48.5 kg)   SpO2 96%   BMI 19.57 kg/m         Assessment & Plan:   [place] Hacker  Dominy in today with chief complaint of Feels like something in both eyes and Anxiety   1. Eye irritation Resolved Avoid rubbing eyes  2. GAD (generalized anxiety disorder) Needs to see PCP- scheduled for tomorrow.    The above assessment and management plan was discussed with the patient. The patient verbalized understanding of and has agreed to the management plan. Patient is aware to call the clinic if symptoms persist or worsen. Patient is aware when to return to the clinic for a follow-up visit. Patient educated on when it is appropriate to go to the emergency department.   Mary-Margaret Hassell Done, FNP

## 2022-05-06 NOTE — Patient Instructions (Addendum)
DUE TO COVID-19 ONLY TWO VISITORS  (aged 82 and older)  ARE ALLOWED TO COME WITH YOU AND STAY IN THE WAITING ROOM ONLY DURING PRE OP AND PROCEDURE.   **NO VISITORS ARE ALLOWED IN THE SHORT STAY AREA OR RECOVERY ROOM!!**  IF YOU WILL BE ADMITTED INTO THE HOSPITAL YOU ARE ALLOWED ONLY FOUR SUPPORT PEOPLE DURING VISITATION HOURS ONLY (7 AM -8PM)   The support person(s) must pass our screening, gel in and out, and wear a mask at all times, including in the patient's room. Patients must also wear a mask when staff or their support person are in the room. Visitors GUEST BADGE MUST BE WORN VISIBLY  One adult visitor may remain with you overnight and MUST be in the room by 8 P.M.     Your procedure is scheduled on: 05/14/22   Report to [facility] Main Entrance    Report to admitting at : 11:30 AM   Call this number if you have problems the morning of surgery [phone]   Do not eat food :After Midnight.   After Midnight you may have the following liquids until : 11:00 AM DAY OF SURGERY  Water Black Coffee (sugar ok, NO MILK/CREAM OR CREAMERS)  Tea (sugar ok, NO MILK/CREAM OR CREAMERS) regular and decaf                             Plain Jell-O (NO RED)                                           Fruit ices (not with fruit pulp, NO RED)                                     Popsicles (NO RED)                                                                  Juice: apple, WHITE grape, WHITE cranberry Sports drinks like Gatorade (NO RED)   The day of surgery:  Drink ONE (1) Pre-Surgery Clear Ensure or G2 at: 11:00 AM the morning of surgery. Drink in one sitting. Do not sip.  This drink was given to you during your hospital  pre-op appointment visit. Nothing else to drink after completing the  Pre-Surgery Clear Ensure or G2.          If you have questions, please contact your surgeon's office.  Oral Hygiene is also important to reduce your risk of infection.                                     Remember - BRUSH YOUR TEETH THE MORNING OF SURGERY WITH YOUR REGULAR TOOTHPASTE  DENTURES WILL BE REMOVED PRIOR TO SURGERY PLEASE DO NOT APPLY "Poly grip" OR ADHESIVES!!!   Do NOT smoke after Midnight   Take these medicines the morning of surgery with A SIP OF WATER: buspirone,loratadine,atenolol,diltiazem,pantoprazole.Tylenol as needed.  You may not have any metal on your body including hair pins, jewelry, and body piercing             Do not wear make-up, lotions, powders, perfumes/cologne, or deodorant  Do not wear nail polish including gel and S&S, artificial/acrylic nails, or any other type of covering on natural nails including finger and toenails. If you have artificial nails, gel coating, etc. that needs to be removed by a nail salon please have this removed prior to surgery or surgery may need to be canceled/ delayed if the surgeon/ anesthesia feels like they are unable to be safely monitored.   Do not shave  48 hours prior to surgery.    Do not bring valuables to the hospital. [place].   Contacts, glasses, or bridgework may not be worn into surgery.   Bring small overnight bag day of surgery.   DO NOT [place]. PHARMACY WILL DISPENSE MEDICATIONS LISTED ON YOUR MEDICATION LIST TO YOU DURING YOUR ADMISSION [place]!    Patients discharged on the day of surgery will not be allowed to drive home.  Someone NEEDS to stay with you for the first 24 hours after anesthesia.   Special Instructions: Bring a copy of your healthcare power of attorney and living will documents         the day of surgery if you haven't scanned them before.              Please read over the following fact sheets you were given: IF YOU HAVE QUESTIONS ABOUT YOUR PRE-OP INSTRUCTIONS PLEASE CALL [phone]    [facility] - Preparing for Surgery Before surgery, you can play an important  role.  Because skin is not sterile, your skin needs to be as free of germs as possible.  You can reduce the number of germs on your skin by washing with CHG (chlorahexidine gluconate) soap before surgery.  CHG is an antiseptic cleaner which kills germs and bonds with the skin to continue killing germs even after washing. Please DO NOT use if you have an allergy to CHG or antibacterial soaps.  If your skin becomes reddened/irritated stop using the CHG and inform your nurse when you arrive at Short Stay. Do not shave (including legs and underarms) for at least 48 hours prior to the first CHG shower.  You may shave your face/neck. Please follow these instructions carefully:  1.  Shower with CHG Soap the night before surgery and the  morning of Surgery.  2.  If you choose to wash your hair, wash your hair first as usual with your  normal  shampoo.  3.  After you shampoo, rinse your hair and body thoroughly to remove the  shampoo.                           4.  Use CHG as you would any other liquid soap.  You can apply chg directly  to the skin and wash                       Gently with a scrungie or clean washcloth.  5.  Apply the CHG Soap to your body ONLY FROM THE NECK DOWN.   Do not use on face/ open  Wound or open sores. Avoid contact with eyes, ears mouth and genitals (private parts).                       Wash face,  Genitals (private parts) with your normal soap.             6.  Wash thoroughly, paying special attention to the area where your surgery  will be performed.  7.  Thoroughly rinse your body with warm water from the neck down.  8.  DO NOT shower/wash with your normal soap after using and rinsing off  the CHG Soap.                9.  Pat yourself dry with a clean towel.            10.  Wear clean pajamas.            11.  Place clean sheets on your bed the night of your first shower and do not  sleep with pets. Day of Surgery : Do not apply any lotions/deodorants  the morning of surgery.  Please wear clean clothes to the hospital/surgery center.  FAILURE TO FOLLOW THESE INSTRUCTIONS MAY RESULT IN THE CANCELLATION OF YOUR SURGERY PATIENT SIGNATURE_________________________________  NURSE SIGNATURE__________________________________  ________________________________________________________________________  Adam Phenix  An incentive spirometer is a tool that can help keep your lungs clear and active. This tool measures how well you are filling your lungs with each breath. Taking long deep breaths may help reverse or decrease the chance of developing breathing (pulmonary) problems (especially infection) following: A long period of time when you are unable to move or be active. BEFORE THE PROCEDURE  If the spirometer includes an indicator to show your best effort, your nurse or respiratory therapist will set it to a desired goal. If possible, sit up straight or lean slightly forward. Try not to slouch. Hold the incentive spirometer in an upright position. INSTRUCTIONS FOR USE  Sit on the edge of your bed if possible, or sit up as far as you can in bed or on a chair. Hold the incentive spirometer in an upright position. Breathe out normally. Place the mouthpiece in your mouth and seal your lips tightly around it. Breathe in slowly and as deeply as possible, raising the piston or the ball toward the top of the column. Hold your breath for 3-5 seconds or for as long as possible. Allow the piston or ball to fall to the bottom of the column. Remove the mouthpiece from your mouth and breathe out normally. Rest for a few seconds and repeat Steps 1 through 7 at least 10 times every 1-2 hours when you are awake. Take your time and take a few normal breaths between deep breaths. The spirometer may include an indicator to show your best effort. Use the indicator as a goal to work toward during each repetition. After each set of 10 deep breaths, practice  coughing to be sure your lungs are clear. If you have an incision (the cut made at the time of surgery), support your incision when coughing by placing a pillow or rolled up towels firmly against it. Once you are able to get out of bed, walk around indoors and cough well. You may stop using the incentive spirometer when instructed by your caregiver.  RISKS AND COMPLICATIONS Take your time so you do not get dizzy or light-headed. If you are in pain, you may need to take or ask  for pain medication before doing incentive spirometry. It is harder to take a deep breath if you are having pain. AFTER USE Rest and breathe slowly and easily. It can be helpful to keep track of a log of your progress. Your caregiver can provide you with a simple table to help with this. If you are using the spirometer at home, follow these instructions: [place] IF:  You are having difficultly using the spirometer. You have trouble using the spirometer as often as instructed. Your pain medication is not giving enough relief while using the spirometer. You develop fever of 100.5 F (38.1 C) or higher. SEEK IMMEDIATE MEDICAL CARE IF:  You cough up bloody sputum that had not been present before. You develop fever of 102 F (38.9 C) or greater. You develop worsening pain at or near the incision site. MAKE SURE YOU:  Understand these instructions. Will watch your condition. Will get help right away if you are not doing well or get worse. Document Released: 06/24/2006 Document Revised: 05/06/2011 Document Reviewed: 08/25/2006 [facility] Patient Information 2014 [place], [place].   ________________________________________________________________________

## 2022-05-06 NOTE — Patient Instructions (Signed)
Allergic Conjunctivitis, Adult  Allergic conjunctivitis is inflammation of the clear membrane that covers the white part of your eye and the inner surface of your eyelid. This area is called the conjunctiva. This condition can make your eye red or pink. It can also make your eye feel itchy. This condition is not contagious. This means that it cannot be spread from one person to another person. What are the causes? This condition is caused by allergens. These are things that can cause an allergic reaction in some people. Common allergens include: Outdoor allergens, such as: Pollen, including pollen from grass and weeds. Mold. Car fumes. Indoor allergens, such as: Dust. Smoke. Mold. Proteins in a pet's pee (urine), saliva, or dander. Proteins that build up in contact lenses. What increases the risk? You are more likely to develop this condition if you have a family history of these things: Allergies. Conditions that you get because of allergens, such as asthma or inflammation of the skin (eczema). What are the signs or symptoms? Symptoms of this condition include eyes that are: Itchy. Red. Watery. Puffy. Your eyes may also: Sting or burn. Have clear fluid draining from them. Have thick mucus coming from them. This happens in severe cases. How is this treated? Treatment for this condition may include: Using cold, wet cloths (cold compresses) to soothe itching and swelling. Washing the face, hair, and clothing to remove allergens. Using eye drops. These may include: Eye drops that block allergies. Eye drops that reduce swelling and irritation. Steroid eye drops if other treatments have not worked. Oral antihistamine medicines. These medicines lessen your allergies. You may need these if eye drops do not help or are difficult to use. Air purifier at home and work. Wrap around sunglasses. This may help to block allergens from reaching the eye. Not wearing contact lenses, if the  doctor has found that contact lenses caused your symptoms. Use daily wear disposal contact lenses instead. Follow these instructions at home: Eye care Place a cool, clean washcloth on your eye for 10-20 minutes. Do this 3-4 times a day. Do not touch or rub your eyes. Do not wear contact lenses until the inflammation is gone. Wear glasses instead. Do not wear eye makeup until the inflammation is gone. General instructions Try not to be around things that you are allergic to. Take or apply over-the-counter and prescription medicines only as told by your doctor. These include any eye drops. Drink enough fluid to keep your pee pale yellow. Keep all follow-up visits. Contact a doctor if: Your symptoms get worse. Your symptoms do not get better with treatment. You have mild eye pain. You are sensitive to light. You have spots or blisters on your eyes. You have pus coming from your eye. You have a fever. Get help right away if: You have redness, swelling, or other symptoms in only one eye. You cannot see well. You have other vision changes. You have very bad eye pain. Summary Allergic conjunctivitis is caused by allergens. It can make your eye red or pink, and it can make your eye feel itchy. This condition cannot be spread from one person to another person. Avoid things that you are allergic to. Take or apply over-the-counter and prescription medicines only as told by your doctor. These include any eye drops. Contact your doctor if your symptoms get worse or they do not get better with treatment. This information is not intended to replace advice given to you by your health care provider. Make sure you   discuss any questions you have with your health care provider. Document Revised: 04/20/2021 Document Reviewed: 04/20/2021 Elsevier Patient Education  2023 Elsevier Inc.  

## 2022-05-07 ENCOUNTER — Encounter: Payer: Self-pay | Admitting: Family Medicine

## 2022-05-07 ENCOUNTER — Ambulatory Visit (INDEPENDENT_AMBULATORY_CARE_PROVIDER_SITE_OTHER): Payer: Medicare HMO | Admitting: Family Medicine

## 2022-05-07 VITALS — BP 166/84 | HR 80 | Temp 98.8°F | Ht 62.0 in | Wt 107.0 lb

## 2022-05-07 DIAGNOSIS — I10 Essential (primary) hypertension: Secondary | ICD-10-CM

## 2022-05-07 DIAGNOSIS — F411 Generalized anxiety disorder: Secondary | ICD-10-CM | POA: Diagnosis not present

## 2022-05-07 MED ORDER — MIRTAZAPINE 7.5 MG PO TABS: 7.5000 mg | ORAL_TABLET | Freq: Every day | ORAL | 3 refills | Status: DC

## 2022-05-07 MED ORDER — BUSPIRONE HCL 15 MG PO TABS: 15.0000 mg | ORAL_TABLET | Freq: Three times a day (TID) | ORAL | 3 refills | Status: DC

## 2022-05-07 NOTE — Patient Instructions (Signed)
STOP Trazodone START Mirtazapine Extra dose of Buspirone added if needed for anxiety in the afternoon

## 2022-05-07 NOTE — Progress Notes (Signed)
Subjective: CC: Anxiety PCP: Janora Norlander, DO TY:[id number] T Sigur is a 82 y.o. female presenting to clinic today for:  1.  Anxiety Patient reports that she has been under more stress and anxiety.  She has both of her daughters now residing with her to help with care.  She is used to living independently so this causes her stress.  She reports that sleep is fair but mostly her nerves are not the issue.  She is supposed be getting a hip surgery done next week and is still in some pain from that.  Currently treated with an opioid for that pain.   ROS: Per HPI  Allergies  Allergen Reactions   Bee Venom Anaphylaxis   Hydrocodone Swelling   Penicillins Anaphylaxis and Rash    Has patient had a PCN reaction causing immediate rash, facial/tongue/throat swelling, SOB or lightheadedness with hypotension: Yes Has patient had a PCN reaction causing severe rash involving mucus membranes or skin necrosis: Yes Has patient had a PCN reaction that required hospitalization: Yes Has patient had a PCN reaction occurring within the last 10 years: No If all of the above answers are "NO", then may proceed with Cephalosporin use.    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 family unaware   Dysphagia    Dyspnea    Generalized weakness    GERD (gastroesophageal reflux disease)    GERD (gastroesophageal reflux disease)    High cholesterol    Hoarseness    Hx of cardiovascular stress test    negative 2012 and 2016   Hypertension    Insomnia    Intervertebral disc disorder with myelopathy, lumbar region    Lumbago    Lumbar spine pain    PONV (postoperative nausea and  vomiting)    confusion after anesthesia   Renal insufficiency    Ringing in ears    resolved   Spondylosis with myelopathy, lumbar region    legs, back    Current Outpatient Medications:    acetaminophen (TYLENOL) 500 MG tablet, Take 2 tablets (1,000 mg total) by mouth every 8 (eight) hours., Disp: 30 tablet, Rfl: 0   atenolol (TENORMIN) 25 MG tablet, Take 1 tablet (25 mg total) by mouth daily., Disp: 90 tablet, Rfl: 3   busPIRone (BUSPAR) 15 MG tablet, Take 1 tablet (15 mg total) by mouth 2 (two) times daily., Disp: 180 tablet, Rfl: 3   celecoxib (CELEBREX) 200 MG capsule, TAKE ONE CAPSULE DAILY WITH FOOD, Disp: 30 capsule, Rfl: 2   cyclobenzaprine (FLEXERIL) 10 MG tablet, Take 10 mg by mouth 3 (three) times daily as needed for muscle spasms., Disp: , Rfl:    diltiazem (TIADYLT ER) 120 MG 24 hr capsule, TAKE 1 CAPSULE BY MOUTH EVERY DAY, Disp: 90 capsule, Rfl: 3   docusate sodium (COLACE) 100 MG capsule, Take 1 capsule (100 mg total) by mouth 2 (two) times daily., Disp: 28 capsule, Rfl: 0   donepezil (ARICEPT) 5 MG tablet, Take 1 tablet (5 mg total) by mouth at bedtime. For dementia, Disp: 90 tablet, Rfl: 3   ezetimibe (ZETIA) 10 MG tablet, Take 1 tablet (10 mg total) by  mouth daily., Disp: 30 tablet, Rfl: 2   FEMRING 0.05 MG/24HR RING, INSERT VAGINALLY AS DIRECTED EVERY 3 MONTHS, Disp: 1 each, Rfl: 2   fluticasone (FLONASE) 50 MCG/ACT nasal spray, USE TWO SPRAYS IN EACH NOSTRIL ONCE DAILY (Patient taking differently: Place 1 spray into both nostrils daily as needed for allergies.), Disp: 48 mL, Rfl: 1   hydrALAZINE (APRESOLINE) 25 MG tablet, TAKE 1 TABLET BY MOUTH TWICE A DAY, Disp: 60 tablet, Rfl: 0   HYDROcodone-acetaminophen (NORCO) 10-325 MG tablet, Take 1 tablet by mouth every 4 (four) hours as needed for severe pain., Disp: , Rfl:    ipratropium (ATROVENT) 0.06 % nasal spray, Place 1 spray into both nostrils 2 (two) times daily as needed for rhinitis., Disp: , Rfl:    lisinopril  (ZESTRIL) 40 MG tablet, Take 1 tablet (40 mg total) by mouth daily., Disp: 90 tablet, Rfl: 3   loratadine (CLARITIN) 10 MG tablet, Take 1 tablet by mouth once daily, Disp: 90 tablet, Rfl: 0   pantoprazole (PROTONIX) 40 MG tablet, TAKE 1 TABLET BY MOUTH ONCE DAILY AS NEEDED FOR ACID REFLUX (Patient taking differently: Take 40 mg by mouth daily.), Disp: 90 tablet, Rfl: 3   polyethylene glycol (MIRALAX / GLYCOLAX) 17 g packet, Take 17 g by mouth 2 (two) times daily. (Patient taking differently: Take 17 g by mouth daily as needed for moderate constipation.), Disp: 28 packet, Rfl: 0   tiZANidine (ZANAFLEX) 2 MG tablet, Take 2 mg by mouth 2 (two) times daily., Disp: , Rfl:    traZODone (DESYREL) 50 MG tablet, Take 0.5-1.5 tablets (25-75 mg total) by mouth at bedtime as needed for sleep. (Patient taking differently: Take 100 mg by mouth at bedtime.), Disp: 135 tablet, Rfl: 3 Social History   Socioeconomic History   Marital status: Divorced    Spouse name: Not on file   Number of children: 2   Years of education: Not on file   Highest education level: Not on file  Occupational History   Not on file  Tobacco Use   Smoking status: Former    Packs/day: 4.00    Years: 3.00    Total pack years: 12.00    Types: Cigarettes    Start date: 06/10/1960    Quit date: 02/20/1964    Years since quitting: 58.2   Smokeless tobacco: Never  Vaping Use   Vaping Use: Never used  Substance and Sexual Activity   Alcohol use: No    Alcohol/week: 0.0 standard drinks of alcohol   Drug use: No   Sexual activity: Not Currently    Birth control/protection: None  Other Topics Concern   Not on file  Social History Narrative   ** Merged History Encounter **       ** Merged History Encounter **  Ms [name] lives alone. She is separated/divorced from her second husband and widowed by her first husband. She has twin daughters, [name] and [name]. [name] has been a cause of stress for her and she believes was preven    ting her from getting medical care that she needed. She is no longer talking to [facility]. She states that [name] is involved in her care and that she trusts her to have her best interest in mind.      Social Determinants of Health   Financial Resource Strain: Low Risk  (03/28/2021)   Overall Financial Resource Strain (CARDIA)    Difficulty of Paying Living Expenses: Not hard at all  Food Insecurity: No Food Insecurity (  03/28/2021)   Hunger Vital Sign    Worried About Running Out of Food in the Last Year: Never true    [place] in the Last Year: Never true  Transportation Needs: No Transportation Needs (03/28/2021)   PRAPARE - Hydrologist (Medical): No    Lack of Transportation (Non-Medical): No  Physical Activity: Insufficiently Active (03/28/2021)   Exercise Vital Sign    Days of Exercise per Week: 5 days    Minutes of Exercise per Session: 20 min  Stress: No Stress Concern Present (03/28/2021)   [place]    Feeling of Stress : Not at all  Social Connections: Moderately Integrated (03/28/2021)   Social Connection and Isolation Panel [NHANES]    Frequency of Communication with Friends and Family: More than three times a week    Frequency of Social Gatherings with Friends and Family: More than three times a week    Attends Religious Services: 1 to 4 times per year    Active Member of [employer] or Organizations: Yes    Attends Archivist Meetings: 1 to 4 times per year    Marital Status: Divorced  Human resources officer Violence: Not At Risk (03/28/2021)   Humiliation, Afraid, Rape, and Kick questionnaire    Fear of Current or Ex-Partner: No    Emotionally Abused: No    Physically Abused: No    Sexually Abused: No   Family History  Problem Relation Age of Onset   Diabetes Mother    Heart disease Mother    Hypertension Mother    Gallbladder disease Mother    Heart disease Father     Pneumonia Sister    Breast cancer Paternal Grandmother    Hypertension Brother    Hyperlipidemia Brother    Hypertension Child    Gallbladder disease Other     Objective: Office vital signs reviewed. BP (!) 166/84   Pulse 80   Temp 98.8 F (37.1 C)   Ht '5\' 2"'$  (1.575 m)   Wt 107 lb (48.5 kg)   SpO2 98%   BMI 19.57 kg/m   Physical Examination:  General: Awake, alert, thin, chronically ill-appearing female, No acute distress HEENT: Sclera white. Cardio: regular rate and rhythm, S1S2 heard, no murmurs appreciated Pulm: clear to auscultation bilaterally, no wheezes, rhonchi or rales; normal work of breathing on room air MSK: Antalgic gait and station.  Requires assistance for ambulation Psych: Very pleasant, interactive.  Becomes frustrated with her daughter when she tries to talk over her     05/07/2022    2:34 PM 05/06/2022   11:01 AM 02/04/2022   10:12 AM  Depression screen PHQ 2/9  Decreased Interest 1 1 0  Down, Depressed, Hopeless 3 3 0  PHQ - 2 Score 4 4 0  Altered sleeping 0 0   Tired, decreased energy 3 3   Change in appetite 3 3   Feeling bad or failure about yourself  2 2   Trouble concentrating 1 1   Moving slowly or fidgety/restless 2 3   Suicidal thoughts 0 0   PHQ-9 Score 15 16   Difficult doing work/chores Somewhat difficult Extremely dIfficult       05/07/2022    2:31 PM 05/06/2022   11:02 AM 08/09/2021    3:21 PM 01/23/2021    8:30 AM  GAD 7 : Generalized Anxiety Score  Nervous, Anxious, on Edge '3 3 2 1  '$ Control/stop  worrying '3 3 2 1  '$ Worry too much - different things '3 3 2 1  '$ Trouble relaxing '3 3 3 1  '$ Restless '3 3 2 '$ 0  Easily annoyed or irritable '3 3 2 '$ 0  Afraid - awful might happen '3 3 3 '$ 0  Total GAD 7 Score '21 21 16 4  '$ Anxiety Difficulty Extremely difficult Extremely difficult Somewhat difficult Somewhat difficult     Assessment/ Plan: 82 y.o. female   GAD (generalized anxiety disorder) - Plan: mirtazapine (REMERON) 7.5 MG tablet,  busPIRone (BUSPAR) 15 MG tablet  Essential hypertension  Advance buspirone to 3 times daily dosing.  Start mirtazapine at bedtime.  Discontinue trazodone since we are starting mirtazapine.  I would like to follow-up with her again in about 6 weeks.  Okay via video visit if cannot come in the office because she will be status post surgery of the hip at that time.  Blood pressure is not at goal.  I would like her to check blood pressures closely at home and if remains above 150/90, I fear that she may not be able to go through with her surgery.  Difficult to tell if this is a pain response that she did actively have hip pain during her visit  No orders of the defined types were placed in this encounter.  No orders of the defined types were placed in this encounter.    Janora Norlander, DO [place] [phone]

## 2022-05-08 ENCOUNTER — Other Ambulatory Visit: Payer: Self-pay

## 2022-05-08 ENCOUNTER — Encounter (HOSPITAL_COMMUNITY)
Admission: RE | Admit: 2022-05-08 | Discharge: 2022-05-08 | Disposition: A | Discharge status: Home / Self Care | Payer: Medicare HMO | Source: Ambulatory Visit | Attending: Orthopedic Surgery | Admitting: Orthopedic Surgery

## 2022-05-08 ENCOUNTER — Encounter (HOSPITAL_COMMUNITY): Payer: Self-pay

## 2022-05-08 DIAGNOSIS — Z01812 Encounter for preprocedural laboratory examination: Secondary | ICD-10-CM | POA: Insufficient documentation

## 2022-05-08 DIAGNOSIS — M1612 Unilateral primary osteoarthritis, left hip: Secondary | ICD-10-CM | POA: Insufficient documentation

## 2022-05-08 DIAGNOSIS — I10 Essential (primary) hypertension: Secondary | ICD-10-CM | POA: Diagnosis not present

## 2022-05-08 DIAGNOSIS — R7303 Prediabetes: Secondary | ICD-10-CM | POA: Insufficient documentation

## 2022-05-08 DIAGNOSIS — Z01818 Encounter for other preprocedural examination: Secondary | ICD-10-CM

## 2022-05-08 HISTORY — DX: Anxiety disorder, unspecified: F41.9

## 2022-05-08 HISTORY — DX: Depression, unspecified: F32.A

## 2022-05-08 LAB — SURGICAL PCR SCREEN
MRSA, PCR: NEGATIVE
Staphylococcus aureus: NEGATIVE

## 2022-05-08 LAB — BASIC METABOLIC PANEL
Anion gap: 9 (ref 5–15)
BUN: 9 mg/dL (ref 8–23)
CO2: 26 mmol/L (ref 22–32)
Calcium: 8.9 mg/dL (ref 8.9–10.3)
Chloride: 102 mmol/L (ref 98–111)
Creatinine, Ser: 0.68 mg/dL (ref 0.44–1.00)
GFR, Estimated: >60 mL/min (ref >60)
Glucose, Bld: 103 mg/dL — ABNORMAL HIGH (ref 70–99)
Potassium: 3.9 mmol/L (ref 3.5–5.1)
Sodium: 137 mmol/L (ref 135–145)

## 2022-05-08 LAB — CBC
HCT: 38.7 % (ref 36.0–46.0)
Hemoglobin: 12.6 g/dL (ref 12.0–15.0)
MCH: 30.1 pg (ref 26.0–34.0)
MCHC: 32.6 g/dL (ref 30.0–36.0)
MCV: 92.4 fL (ref 80.0–100.0)
Platelets: 291 10*3/uL (ref 150–400)
RBC: 4.19 MIL/uL (ref 3.87–5.11)
RDW: 13.3 % (ref 11.5–15.5)
WBC: 8.7 10*3/uL (ref 4.0–10.5)
nRBC: 0 % (ref 0.0–0.2)

## 2022-05-08 LAB — GLUCOSE, CAPILLARY: Glucose-Capillary: 99 mg/dL (ref 70–99)

## 2022-05-09 LAB — HEMOGLOBIN A1C
Hgb A1c MFr Bld: 5.4 % (ref 4.8–5.6)
Mean Plasma Glucose: 108 mg/dL

## 2022-05-13 NOTE — H&P (Signed)
TOTAL HIP ADMISSION H&P  Patient is admitted for left total hip arthroplasty.  Subjective:  Chief Complaint: left hip pain  HPI: Kaitlyn Serrano, 82 y.o. female, has a history of pain and functional disability in the left hip(s) due to arthritis and patient has failed non-surgical conservative treatments for greater than 12 weeks to include NSAID's and/or analgesics and activity modification.  Onset of symptoms was gradual starting 2 years ago with gradually worsening course since that time.The patient noted no past surgery on the left hip(s).  Patient currently rates pain in the left hip at 8 out of 10 with activity. Patient has worsening of pain with activity and weight bearing, pain that interfers with activities of daily living, and pain with passive range of motion. Patient has evidence of joint space narrowing by imaging studies. This condition presents safety issues increasing the risk of falls.  There is no current active infection.  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   Right hip pain 11/03/2018   Overweight (BMI 25.0-29.9) 07/10/2018   High risk medication use 12/30/2017   Prediabetes 02/12/2017   Major depression 12/02/2016   Constipation 01/16/2015   Insomnia 01/16/2015   GAD (generalized anxiety disorder) 10/08/2013   Vitamin D deficiency 10/08/2013   GERD (gastroesophageal reflux disease) 10/08/2013   Hypertension 08/13/2012   Hyperlipemia 08/13/2012   Seasonal allergic rhinitis 06/30/2012   Hemorrhoids 06/25/2011   Past Medical History:  Diagnosis Date   Anxiety    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 family unaware   Dysphagia    Dyspnea    Generalized weakness    GERD  (gastroesophageal reflux disease)    GERD (gastroesophageal reflux disease)    High cholesterol    Hoarseness    Hx of cardiovascular stress test    negative 2012 and 2016   Hypertension    Insomnia    Intervertebral disc disorder with myelopathy, lumbar region    Lumbago    Lumbar spine pain    PONV (postoperative nausea and vomiting)    confusion after anesthesia   Renal insufficiency    Ringing in ears    resolved   Spondylosis with myelopathy, lumbar region    legs, back    Past Surgical History:  Procedure Laterality Date   ABDOMINAL HYSTERECTOMY     BACK SURGERY     BONE MARROW ASPIRATION     CERVICAL SPINE SURGERY     EYE SURGERY Bilateral    Lasic   FIXATION KYPHOPLASTY     LAMINOTOMY     SPINAL FUSION     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   Procedure: TOTAL HIP ARTHROPLASTY ANTERIOR APPROACH;  Surgeon: Paralee Cancel, MD;  Location: [facility];  Service: Orthopedics;  Laterality: Right;  70 mins    No current facility-administered medications for this encounter.   Current Outpatient Medications  Medication Sig Dispense Refill Last Dose   acetaminophen (TYLENOL) 500 MG tablet Take 2 tablets (1,000 mg total) by mouth every 8 (eight) hours. 30 tablet 0    atenolol (TENORMIN) 25 MG tablet Take 1 tablet (25 mg total) by mouth daily. 90 tablet 3    celecoxib (CELEBREX) 200 MG capsule TAKE ONE  CAPSULE DAILY WITH FOOD 30 capsule 2    cyclobenzaprine (FLEXERIL) 10 MG tablet Take 10 mg by mouth 3 (three) times daily as needed for muscle spasms.      diltiazem (TIADYLT ER) 120 MG 24 hr capsule TAKE 1 CAPSULE BY MOUTH EVERY DAY 90 capsule 3    docusate sodium (COLACE) 100 MG capsule Take 1 capsule (100 mg total) by mouth 2 (two) times daily. 28 capsule 0    donepezil (ARICEPT) 5 MG tablet Take 1 tablet (5 mg total) by mouth at bedtime. For dementia 90 tablet 3    ezetimibe (ZETIA) 10 MG tablet Take 1 tablet (10 mg total) by mouth daily. 30 tablet 2    FEMRING 0.05 MG/24HR  RING INSERT VAGINALLY AS DIRECTED EVERY 3 MONTHS 1 each 2    fluticasone (FLONASE) 50 MCG/ACT nasal spray USE TWO SPRAYS IN EACH NOSTRIL ONCE DAILY (Patient taking differently: Place 1 spray into both nostrils daily as needed for allergies.) 48 mL 1    hydrALAZINE (APRESOLINE) 25 MG tablet TAKE 1 TABLET BY MOUTH TWICE A DAY 60 tablet 0    HYDROcodone-acetaminophen (NORCO) 10-325 MG tablet Take 1 tablet by mouth every 4 (four) hours as needed for severe pain.      ipratropium (ATROVENT) 0.06 % nasal spray Place 1 spray into both nostrils 2 (two) times daily as needed for rhinitis.      lisinopril (ZESTRIL) 40 MG tablet Take 1 tablet (40 mg total) by mouth daily. 90 tablet 3    loratadine (CLARITIN) 10 MG tablet Take 1 tablet by mouth once daily 90 tablet 0    pantoprazole (PROTONIX) 40 MG tablet TAKE 1 TABLET BY MOUTH ONCE DAILY AS NEEDED FOR ACID REFLUX (Patient taking differently: Take 40 mg by mouth daily.) 90 tablet 3    polyethylene glycol (MIRALAX / GLYCOLAX) 17 g packet Take 17 g by mouth 2 (two) times daily. (Patient taking differently: Take 17 g by mouth daily as needed for moderate constipation.) 28 packet 0    tiZANidine (ZANAFLEX) 2 MG tablet Take 2 mg by mouth 2 (two) times daily.      busPIRone (BUSPAR) 15 MG tablet Take 1 tablet (15 mg total) by mouth 3 (three) times daily. 270 tablet 3    mirtazapine (REMERON) 7.5 MG tablet Take 1 tablet (7.5 mg total) by mouth at bedtime. For anxiety/ appetite/ sleep 90 tablet 3    Allergies  Allergen Reactions   Bee Venom Anaphylaxis   Hydrocodone Swelling   Penicillins Anaphylaxis and Rash    Has patient had a PCN reaction causing immediate rash, facial/tongue/throat swelling, SOB or lightheadedness with hypotension: Yes Has patient had a PCN reaction causing severe rash involving mucus membranes or skin necrosis: Yes Has patient had a PCN reaction that required hospitalization: Yes Has patient had a PCN reaction occurring within the last 10  years: No If all of the above answers are "NO", then may proceed with Cephalosporin use.    Amlodipine Swelling   Elavil [Amitriptyline Hcl] Other (See Comments)    Confusion, hallucinations   Statins Swelling and Other (See Comments)    Peeling of skin    Social History   Tobacco Use   Smoking status: Former    Packs/day: 4.00    Years: 3.00    Additional pack years: 0.00    Total pack years: 12.00    Types: Cigarettes    Start date: 06/10/1960    Quit date: 02/20/1964    Years  since quitting: 58.2   Smokeless tobacco: Never  Substance Use Topics   Alcohol use: No    Alcohol/week: 0.0 standard drinks of alcohol    Family History  Problem Relation Age of Onset   Diabetes Mother    Heart disease Mother    Hypertension Mother    Gallbladder disease Mother    Heart disease Father    Pneumonia Sister    Breast cancer Paternal Grandmother    Hypertension Brother    Hyperlipidemia Brother    Hypertension Child    Gallbladder disease Other      Review of Systems  Objective:  Physical Exam  Vital signs in last 24 hours:    Labs:   Estimated body mass index is 19.76 kg/m as calculated from the following:   Height as of 05/08/22: 5\' 2"  (1.575 m).   Weight as of 05/08/22: 49 kg.   Imaging Review Plain radiographs demonstrate severe degenerative joint disease of the left hip(s). The bone quality appears to be adequate for age and reported activity level.      Assessment/Plan:  End stage arthritis, left hip(s)  The patient history, physical examination, clinical judgement of the provider and imaging studies are consistent with end stage degenerative joint disease of the left hip(s) and total hip arthroplasty is deemed medically necessary. The treatment options including medical management, injection therapy, arthroscopy and arthroplasty were discussed at length. The risks and benefits of total hip arthroplasty were presented and reviewed. The risks due to aseptic  loosening, infection, stiffness, dislocation/subluxation,  thromboembolic complications and other imponderables were discussed.  The patient acknowledged the explanation, agreed to proceed with the plan and consent was signed. Patient is being admitted for inpatient treatment for surgery, pain control, PT, OT, prophylactic antibiotics, VTE prophylaxis, progressive ambulation and ADL's and discharge planning.The patient is planning to be discharged  home.   Therapy Plans: HEP Disposition: Home with twin daughters (lives together) Planned DVT Prophylaxis: aspirin 81mg  BID DME needed: none PCP: Ronnie Doss, clearance received Cardio: Dr. Harl Bowie, clearance received TXA: IV Allergies: amlodipine - swelling, Anesthesia Concerns: none BMI: 19.5 Last HgbA1c: Not diabetic   Other: - hx of right THA - did well - hydrocodone 10 mg (TID-QID), flexeril, celebrex (prefers to stay with hydrocodone) - daughter is a nurse - Seems to need some help with memory - daughter helps with meds, etc   Costella Hatcher, PA-C Orthopedic Surgery [facility] [place] [phone]

## 2022-05-14 ENCOUNTER — Telehealth: Payer: Self-pay

## 2022-05-14 ENCOUNTER — Ambulatory Visit (HOSPITAL_COMMUNITY): Payer: Medicare HMO

## 2022-05-14 ENCOUNTER — Encounter (HOSPITAL_COMMUNITY)
Admission: RE | Disposition: A | Discharge status: Home / Self Care | Payer: Self-pay | Source: Home / Self Care | Attending: Orthopedic Surgery

## 2022-05-14 ENCOUNTER — Other Ambulatory Visit: Payer: Self-pay

## 2022-05-14 ENCOUNTER — Encounter (HOSPITAL_COMMUNITY): Payer: Self-pay | Admitting: Orthopedic Surgery

## 2022-05-14 ENCOUNTER — Observation Stay (HOSPITAL_COMMUNITY): Payer: Medicare HMO

## 2022-05-14 ENCOUNTER — Ambulatory Visit (HOSPITAL_COMMUNITY): Payer: Medicare HMO | Admitting: Certified Registered"

## 2022-05-14 ENCOUNTER — Ambulatory Visit (HOSPITAL_BASED_OUTPATIENT_CLINIC_OR_DEPARTMENT_OTHER): Payer: Medicare HMO | Admitting: Certified Registered"

## 2022-05-14 ENCOUNTER — Observation Stay (HOSPITAL_COMMUNITY)
Admission: RE | Admit: 2022-05-14 | Discharge: 2022-05-15 | Disposition: A | Discharge status: Home / Self Care | Payer: Medicare HMO | Attending: Orthopedic Surgery | Admitting: Orthopedic Surgery

## 2022-05-14 DIAGNOSIS — M1612 Unilateral primary osteoarthritis, left hip: Secondary | ICD-10-CM

## 2022-05-14 DIAGNOSIS — M25752 Osteophyte, left hip: Secondary | ICD-10-CM | POA: Diagnosis not present

## 2022-05-14 DIAGNOSIS — I10 Essential (primary) hypertension: Secondary | ICD-10-CM | POA: Diagnosis not present

## 2022-05-14 DIAGNOSIS — F418 Other specified anxiety disorders: Secondary | ICD-10-CM

## 2022-05-14 DIAGNOSIS — Z79899 Other long term (current) drug therapy: Secondary | ICD-10-CM | POA: Diagnosis not present

## 2022-05-14 DIAGNOSIS — Z96642 Presence of left artificial hip joint: Secondary | ICD-10-CM

## 2022-05-14 DIAGNOSIS — Z96653 Presence of artificial knee joint, bilateral: Secondary | ICD-10-CM | POA: Diagnosis not present

## 2022-05-14 DIAGNOSIS — Z96641 Presence of right artificial hip joint: Secondary | ICD-10-CM | POA: Insufficient documentation

## 2022-05-14 DIAGNOSIS — Z87891 Personal history of nicotine dependence: Secondary | ICD-10-CM | POA: Insufficient documentation

## 2022-05-14 DIAGNOSIS — Z96643 Presence of artificial hip joint, bilateral: Secondary | ICD-10-CM | POA: Diagnosis not present

## 2022-05-14 DIAGNOSIS — Z471 Aftercare following joint replacement surgery: Secondary | ICD-10-CM | POA: Diagnosis not present

## 2022-05-14 DIAGNOSIS — F03B11 Unspecified dementia, moderate, with agitation: Secondary | ICD-10-CM

## 2022-05-14 DIAGNOSIS — F411 Generalized anxiety disorder: Secondary | ICD-10-CM

## 2022-05-14 HISTORY — PX: TOTAL HIP ARTHROPLASTY: SHX124

## 2022-05-14 LAB — TYPE AND SCREEN
ABO/RH(D): O NEG
Antibody Screen: NEGATIVE

## 2022-05-14 SURGERY — ARTHROPLASTY, HIP, TOTAL, ANTERIOR APPROACH
Anesthesia: Spinal | Site: Hip | Laterality: Left

## 2022-05-14 MED ORDER — BUPIVACAINE-EPINEPHRINE (PF) 0.25% -1:200000 IJ SOLN
INTRAMUSCULAR | Status: AC
  Filled 2022-05-14: qty 30

## 2022-05-14 MED ORDER — ALUM & MAG HYDROXIDE-SIMETH 200-200-20 MG/5 ML NICU TOPICAL: 1.0000 | TOPICAL | Status: DC | PRN

## 2022-05-14 MED ORDER — PHENYLEPHRINE HCL-NACL 20-0.9 MG/250ML-% IV SOLN
INTRAVENOUS | Status: DC | PRN
  Administered 2022-05-14: 40 ug/min via INTRAVENOUS

## 2022-05-14 MED ORDER — ATENOLOL 25 MG PO TABS
25.0000 mg | ORAL_TABLET | Freq: Every day | ORAL | Status: DC
  Administered 2022-05-15: 25 mg via ORAL
  Filled 2022-05-14: qty 1

## 2022-05-14 MED ORDER — EZETIMIBE 10 MG PO TABS
10.0000 mg | ORAL_TABLET | Freq: Every day | ORAL | Status: DC
  Administered 2022-05-15: 10 mg via ORAL
  Filled 2022-05-14: qty 1

## 2022-05-14 MED ORDER — SODIUM CHLORIDE 0.9 % IV SOLN: INTRAVENOUS | Status: DC

## 2022-05-14 MED ORDER — PROPOFOL 500 MG/50ML IV EMUL
INTRAVENOUS | Status: DC | PRN
  Administered 2022-05-14: 40 ug via INTRAVENOUS
  Administered 2022-05-14: 75 ug/kg/min via INTRAVENOUS

## 2022-05-14 MED ORDER — ONDANSETRON HCL 4 MG/2ML IJ SOLN: 4.0000 mg | Freq: Four times a day (QID) | INTRAMUSCULAR | Status: DC | PRN

## 2022-05-14 MED ORDER — KETOROLAC TROMETHAMINE 30 MG/ML IJ SOLN
INTRAMUSCULAR | Status: AC
  Filled 2022-05-14: qty 1

## 2022-05-14 MED ORDER — LIDOCAINE HCL (PF) 2 % IJ SOLN
INTRAMUSCULAR | Status: AC
  Filled 2022-05-14: qty 15

## 2022-05-14 MED ORDER — BUPIVACAINE-EPINEPHRINE (PF) 0.25% -1:200000 IJ SOLN
INTRAMUSCULAR | Status: DC | PRN
  Administered 2022-05-14: 30 mL

## 2022-05-14 MED ORDER — CELECOXIB 200 MG PO CAPS
200.0000 mg | ORAL_CAPSULE | Freq: Every day | ORAL | Status: DC
  Administered 2022-05-15: 200 mg via ORAL
  Filled 2022-05-14: qty 1

## 2022-05-14 MED ORDER — ONDANSETRON HCL 4 MG/2ML IJ SOLN: 4.0000 mg | Freq: Once | INTRAMUSCULAR | Status: DC | PRN

## 2022-05-14 MED ORDER — VANCOMYCIN HCL IN DEXTROSE 1-5 GM/200ML-% IV SOLN
1000.0000 mg | Freq: Two times a day (BID) | INTRAVENOUS | Status: AC
  Administered 2022-05-15: 1000 mg via INTRAVENOUS
  Filled 2022-05-14: qty 200

## 2022-05-14 MED ORDER — SODIUM CHLORIDE (PF) 0.9 % IJ SOLN
INTRAMUSCULAR | Status: AC
  Filled 2022-05-14: qty 30

## 2022-05-14 MED ORDER — LACTATED RINGERS IV SOLN: INTRAVENOUS | Status: DC

## 2022-05-14 MED ORDER — ALBUMIN HUMAN 5 % IV SOLN
INTRAVENOUS | Status: AC
  Filled 2022-05-14: qty 500

## 2022-05-14 MED ORDER — MIRTAZAPINE 15 MG PO TABS
7.5000 mg | ORAL_TABLET | Freq: Every day | ORAL | Status: DC
  Administered 2022-05-14: 7.5 mg via ORAL
  Filled 2022-05-14: qty 1

## 2022-05-14 MED ORDER — FENTANYL CITRATE PF 50 MCG/ML IJ SOSY
PREFILLED_SYRINGE | INTRAMUSCULAR | Status: AC
  Filled 2022-05-14: qty 1

## 2022-05-14 MED ORDER — VANCOMYCIN HCL IN DEXTROSE 1-5 GM/200ML-% IV SOLN
1000.0000 mg | INTRAVENOUS | Status: AC
  Administered 2022-05-14: 1000 mg via INTRAVENOUS
  Filled 2022-05-14: qty 200

## 2022-05-14 MED ORDER — OXYCODONE HCL 5 MG PO TABS
ORAL_TABLET | ORAL | Status: AC
  Filled 2022-05-14: qty 1

## 2022-05-14 MED ORDER — ONDANSETRON HCL 4 MG/2ML IJ SOLN
INTRAMUSCULAR | Status: DC | PRN
  Administered 2022-05-14: 4 mg via INTRAVENOUS

## 2022-05-14 MED ORDER — MENTHOL 3 MG MT LOZG: 1.0000 | LOZENGE | OROMUCOSAL | Status: DC | PRN

## 2022-05-14 MED ORDER — ORAL CARE MOUTH RINSE: 15.0000 mL | Freq: Once | OROMUCOSAL | Status: DC

## 2022-05-14 MED ORDER — DOCUSATE SODIUM 100 MG PO CAPS
100.0000 mg | ORAL_CAPSULE | Freq: Two times a day (BID) | ORAL | Status: DC
  Administered 2022-05-14 – 2022-05-15 (×2): 100 mg via ORAL
  Filled 2022-05-14 (×2): qty 1

## 2022-05-14 MED ORDER — CYCLOBENZAPRINE HCL 10 MG PO TABS: 10.0000 mg | ORAL_TABLET | Freq: Three times a day (TID) | ORAL | Status: DC | PRN

## 2022-05-14 MED ORDER — TRANEXAMIC ACID-NACL 1000-0.7 MG/100ML-% IV SOLN
1000.0000 mg | Freq: Once | INTRAVENOUS | Status: AC
  Administered 2022-05-14: 1000 mg via INTRAVENOUS
  Filled 2022-05-14: qty 100

## 2022-05-14 MED ORDER — ONDANSETRON HCL 4 MG PO TABS: 4.0000 mg | ORAL_TABLET | Freq: Four times a day (QID) | ORAL | Status: DC | PRN

## 2022-05-14 MED ORDER — ACETAMINOPHEN 325 MG PO TABS: 325.0000 mg | ORAL_TABLET | Freq: Four times a day (QID) | ORAL | Status: DC | PRN

## 2022-05-14 MED ORDER — ALUM & MAG HYDROXIDE-SIMETH 200-200-20 MG/5ML PO SUSP
30.0000 mL | Freq: Four times a day (QID) | ORAL | Status: DC | PRN
  Administered 2022-05-14: 30 mL via ORAL
  Filled 2022-05-14: qty 30

## 2022-05-14 MED ORDER — DEXAMETHASONE SODIUM PHOSPHATE 10 MG/ML IJ SOLN
8.0000 mg | Freq: Once | INTRAMUSCULAR | Status: AC
  Administered 2022-05-14: 10 mg via INTRAVENOUS

## 2022-05-14 MED ORDER — KETOROLAC TROMETHAMINE 30 MG/ML IJ SOLN
INTRAMUSCULAR | Status: DC | PRN
  Administered 2022-05-14: 30 mg via INTRAMUSCULAR

## 2022-05-14 MED ORDER — POVIDONE-IODINE 10 % EX SWAB: 2.0000 | Freq: Once | CUTANEOUS | Status: DC

## 2022-05-14 MED ORDER — HYDROCODONE-ACETAMINOPHEN 5-325 MG PO TABS
1.0000 | ORAL_TABLET | ORAL | Status: DC | PRN
  Administered 2022-05-14 – 2022-05-15 (×4): 1 via ORAL
  Filled 2022-05-14 (×4): qty 1

## 2022-05-14 MED ORDER — LISINOPRIL 20 MG PO TABS
40.0000 mg | ORAL_TABLET | Freq: Every day | ORAL | Status: DC
  Administered 2022-05-15: 40 mg via ORAL
  Filled 2022-05-14: qty 2

## 2022-05-14 MED ORDER — LACTATED RINGERS IV SOLN: INTRAVENOUS | Status: DC | PRN

## 2022-05-14 MED ORDER — IPRATROPIUM BROMIDE 0.06 % NA SOLN: 1.0000 | Freq: Two times a day (BID) | NASAL | Status: DC | PRN

## 2022-05-14 MED ORDER — OXYCODONE HCL 5 MG/5ML PO SOLN: 5.0000 mg | Freq: Once | ORAL | Status: AC | PRN

## 2022-05-14 MED ORDER — FENTANYL CITRATE PF 50 MCG/ML IJ SOSY
PREFILLED_SYRINGE | INTRAMUSCULAR | Status: AC
  Administered 2022-05-14: 50 ug via INTRAVENOUS
  Filled 2022-05-14: qty 2

## 2022-05-14 MED ORDER — BUPIVACAINE IN DEXTROSE 0.75-8.25 % IT SOLN
INTRATHECAL | Status: DC | PRN
  Administered 2022-05-14: 1.6 mL via INTRATHECAL

## 2022-05-14 MED ORDER — MORPHINE SULFATE (PF) 2 MG/ML IV SOLN: 0.5000 mg | INTRAVENOUS | Status: DC | PRN

## 2022-05-14 MED ORDER — HYDROCODONE-ACETAMINOPHEN 7.5-325 MG PO TABS: 1.0000 | ORAL_TABLET | ORAL | Status: DC | PRN

## 2022-05-14 MED ORDER — DONEPEZIL HCL 10 MG PO TABS
5.0000 mg | ORAL_TABLET | Freq: Every day | ORAL | Status: DC
  Administered 2022-05-14: 5 mg via ORAL
  Filled 2022-05-14: qty 1

## 2022-05-14 MED ORDER — 0.9 % SODIUM CHLORIDE (POUR BTL) OPTIME
TOPICAL | Status: DC | PRN
  Administered 2022-05-14: 1000 mL

## 2022-05-14 MED ORDER — HYDROMORPHONE HCL 1 MG/ML IJ SOLN
0.2500 mg | INTRAMUSCULAR | Status: DC | PRN
  Administered 2022-05-14: 0.25 mg via INTRAVENOUS
  Administered 2022-05-14: 0.5 mg via INTRAVENOUS

## 2022-05-14 MED ORDER — SODIUM CHLORIDE (PF) 0.9 % IJ SOLN
INTRAMUSCULAR | Status: DC | PRN
  Administered 2022-05-14: 30 mL

## 2022-05-14 MED ORDER — TRANEXAMIC ACID-NACL 1000-0.7 MG/100ML-% IV SOLN
1000.0000 mg | INTRAVENOUS | Status: AC
  Administered 2022-05-14: 1000 mg via INTRAVENOUS
  Filled 2022-05-14: qty 100

## 2022-05-14 MED ORDER — PHENOL 1.4 % MT LIQD: 1.0000 | OROMUCOSAL | Status: DC | PRN

## 2022-05-14 MED ORDER — PANTOPRAZOLE SODIUM 40 MG PO TBEC
40.0000 mg | DELAYED_RELEASE_TABLET | Freq: Every day | ORAL | Status: DC
  Administered 2022-05-15: 40 mg via ORAL
  Filled 2022-05-14: qty 1

## 2022-05-14 MED ORDER — HYDRALAZINE HCL 25 MG PO TABS
25.0000 mg | ORAL_TABLET | Freq: Two times a day (BID) | ORAL | Status: DC
  Administered 2022-05-14 – 2022-05-15 (×2): 25 mg via ORAL
  Filled 2022-05-14 (×2): qty 1

## 2022-05-14 MED ORDER — ALBUMIN HUMAN 5 % IV SOLN: INTRAVENOUS | Status: DC | PRN

## 2022-05-14 MED ORDER — BISACODYL 10 MG RE SUPP: 10.0000 mg | Freq: Every day | RECTAL | Status: DC | PRN

## 2022-05-14 MED ORDER — LORATADINE 10 MG PO TABS
10.0000 mg | ORAL_TABLET | Freq: Every day | ORAL | Status: DC
  Administered 2022-05-15: 10 mg via ORAL
  Filled 2022-05-14: qty 1

## 2022-05-14 MED ORDER — METOCLOPRAMIDE HCL 5 MG PO TABS: 5.0000 mg | ORAL_TABLET | Freq: Three times a day (TID) | ORAL | Status: DC | PRN

## 2022-05-14 MED ORDER — CHLORHEXIDINE GLUCONATE 0.12 % MT SOLN
15.0000 mL | Freq: Once | OROMUCOSAL | Status: DC
  Administered 2022-05-14: 15 mL via OROMUCOSAL

## 2022-05-14 MED ORDER — HYDROMORPHONE HCL 1 MG/ML IJ SOLN
INTRAMUSCULAR | Status: AC
  Administered 2022-05-14: 0.25 mg via INTRAVENOUS
  Filled 2022-05-14: qty 1

## 2022-05-14 MED ORDER — PHENYLEPHRINE 80 MCG/ML (10ML) SYRINGE FOR IV PUSH (FOR BLOOD PRESSURE SUPPORT)
PREFILLED_SYRINGE | INTRAVENOUS | Status: AC
  Filled 2022-05-14: qty 40

## 2022-05-14 MED ORDER — DIPHENHYDRAMINE HCL 12.5 MG/5ML PO ELIX: 12.5000 mg | ORAL_SOLUTION | ORAL | Status: DC | PRN

## 2022-05-14 MED ORDER — EPHEDRINE SULFATE (PRESSORS) 50 MG/ML IJ SOLN
INTRAMUSCULAR | Status: DC | PRN
  Administered 2022-05-14 (×3): 10 mg via INTRAVENOUS

## 2022-05-14 MED ORDER — LEVOFLOXACIN IN D5W 500 MG/100ML IV SOLN
INTRAVENOUS | Status: DC | PRN
  Administered 2022-05-14: 500 mg via INTRAVENOUS

## 2022-05-14 MED ORDER — ASPIRIN 81 MG PO CHEW
81.0000 mg | CHEWABLE_TABLET | Freq: Two times a day (BID) | ORAL | Status: DC
  Administered 2022-05-14 – 2022-05-15 (×2): 81 mg via ORAL
  Filled 2022-05-14 (×2): qty 1

## 2022-05-14 MED ORDER — POLYETHYLENE GLYCOL 3350 17 G PO PACK
17.0000 g | PACK | Freq: Two times a day (BID) | ORAL | Status: DC
  Administered 2022-05-14: 17 g via ORAL
  Filled 2022-05-14: qty 1

## 2022-05-14 MED ORDER — FENTANYL CITRATE PF 50 MCG/ML IJ SOSY
25.0000 ug | PREFILLED_SYRINGE | INTRAMUSCULAR | Status: DC | PRN
  Administered 2022-05-14 (×2): 50 ug via INTRAVENOUS

## 2022-05-14 MED ORDER — LEVOFLOXACIN IN D5W 500 MG/100ML IV SOLN
500.0000 mg | Freq: Once | INTRAVENOUS | Status: DC
  Filled 2022-05-14: qty 100

## 2022-05-14 MED ORDER — OXYCODONE HCL 5 MG PO TABS
5.0000 mg | ORAL_TABLET | Freq: Once | ORAL | Status: AC | PRN
  Administered 2022-05-14: 5 mg via ORAL

## 2022-05-14 MED ORDER — FENTANYL CITRATE (PF) 100 MCG/2ML IJ SOLN
INTRAMUSCULAR | Status: AC
  Filled 2022-05-14: qty 2

## 2022-05-14 MED ORDER — FLUTICASONE PROPIONATE 50 MCG/ACT NA SUSP: 1.0000 | Freq: Every day | NASAL | Status: DC | PRN

## 2022-05-14 MED ORDER — EPHEDRINE 5 MG/ML INJ
INTRAVENOUS | Status: AC
  Filled 2022-05-14: qty 15

## 2022-05-14 MED ORDER — BUSPIRONE HCL 5 MG PO TABS
15.0000 mg | ORAL_TABLET | Freq: Three times a day (TID) | ORAL | Status: DC
  Administered 2022-05-14 – 2022-05-15 (×2): 15 mg via ORAL
  Filled 2022-05-14 (×2): qty 1

## 2022-05-14 MED ORDER — ALBUMIN HUMAN 5 % IV SOLN
INTRAVENOUS | Status: AC
  Filled 2022-05-14: qty 250

## 2022-05-14 MED ORDER — DILTIAZEM HCL ER COATED BEADS 120 MG PO CP24
120.0000 mg | ORAL_CAPSULE | Freq: Every day | ORAL | Status: DC
  Administered 2022-05-15: 120 mg via ORAL
  Filled 2022-05-14: qty 1

## 2022-05-14 MED ORDER — METOCLOPRAMIDE HCL 5 MG/ML IJ SOLN: 5.0000 mg | Freq: Three times a day (TID) | INTRAMUSCULAR | Status: DC | PRN

## 2022-05-14 MED ORDER — DEXAMETHASONE SODIUM PHOSPHATE 10 MG/ML IJ SOLN
10.0000 mg | Freq: Once | INTRAMUSCULAR | Status: AC
  Administered 2022-05-15: 10 mg via INTRAVENOUS
  Filled 2022-05-14: qty 1

## 2022-05-14 SURGICAL SUPPLY — 44 items
ADH SKN CLS APL DERMABOND .7 (GAUZE/BANDAGES/DRESSINGS) ×1
BAG COUNTER SPONGE SURGICOUNT (BAG) IMPLANT
BAG DECANTER FOR FLEXI CONT (MISCELLANEOUS) IMPLANT
BAG SPEC THK2 15X12 ZIP CLS (MISCELLANEOUS)
BAG SPNG CNTER NS LX DISP (BAG)
BAG ZIPLOCK 12X15 (MISCELLANEOUS) IMPLANT
BLADE SAG 18X100X1.27 (BLADE) ×1 IMPLANT
COVER PERINEAL POST (MISCELLANEOUS) ×1 IMPLANT
COVER SURGICAL LIGHT HANDLE (MISCELLANEOUS) ×1 IMPLANT
CUP ACET PINNACLE SECTR 50MM (Hips) IMPLANT
DERMABOND ADVANCED .7 DNX12 (GAUZE/BANDAGES/DRESSINGS) ×1 IMPLANT
DRAPE FOOT SWITCH (DRAPES) ×1 IMPLANT
DRAPE STERI IOBAN 125X83 (DRAPES) ×1 IMPLANT
DRAPE U-SHAPE 47X51 STRL (DRAPES) ×2 IMPLANT
DRESSING AQUACEL AG SP 3.5X10 (GAUZE/BANDAGES/DRESSINGS) ×1 IMPLANT
DRSG AQUACEL AG ADV 3.5X10 (GAUZE/BANDAGES/DRESSINGS) IMPLANT
DRSG AQUACEL AG SP 3.5X10 (GAUZE/BANDAGES/DRESSINGS) ×1
DURAPREP 26ML APPLICATOR (WOUND CARE) ×1 IMPLANT
ELECT REM PT RETURN 15FT ADLT (MISCELLANEOUS) ×1 IMPLANT
FEM STEM 12/14 TAPER SZ 4 HIP (Orthopedic Implant) ×1 IMPLANT
FEMORAL STEM 12/14 TPR SZ4 HIP (Orthopedic Implant) IMPLANT
GLOVE BIO SURGEON STRL SZ 6 (GLOVE) ×1 IMPLANT
GLOVE BIOGEL PI IND STRL 6.5 (GLOVE) ×1 IMPLANT
GLOVE BIOGEL PI IND STRL 7.5 (GLOVE) ×1 IMPLANT
GLOVE ORTHO TXT STRL SZ7.5 (GLOVE) ×2 IMPLANT
GOWN STRL REUS W/ TWL LRG LVL3 (GOWN DISPOSABLE) ×2 IMPLANT
GOWN STRL REUS W/TWL LRG LVL3 (GOWN DISPOSABLE) ×2
HEAD FEM STD 32X+1 STRL (Hips) IMPLANT
HOLDER FOLEY CATH W/STRAP (MISCELLANEOUS) ×1 IMPLANT
KIT TURNOVER KIT A (KITS) IMPLANT
LINER ACET PNNCL PLUS4 NEUTRAL (Hips) IMPLANT
PACK ANTERIOR HIP CUSTOM (KITS) ×1 IMPLANT
PINNACLE PLUS 4 NEUTRAL (Hips) ×1 IMPLANT
PINNACLE SECTOR CUP 50MM (Hips) ×1 IMPLANT
SCREW 6.5MMX30MM (Screw) IMPLANT
SUT MNCRL AB 4-0 PS2 18 (SUTURE) ×1 IMPLANT
SUT STRATAFIX 0 PDS 27 VIOLET (SUTURE) ×1
SUT VIC AB 1 CT1 36 (SUTURE) ×3 IMPLANT
SUT VIC AB 2-0 CT1 27 (SUTURE) ×2
SUT VIC AB 2-0 CT1 TAPERPNT 27 (SUTURE) ×2 IMPLANT
SUTURE STRATFX 0 PDS 27 VIOLET (SUTURE) ×1 IMPLANT
TRAY FOLEY MTR SLVR 16FR STAT (SET/KITS/TRAYS/PACK) IMPLANT
TUBE SUCTION HIGH CAP CLEAR NV (SUCTIONS) ×1 IMPLANT
WATER STERILE IRR 1000ML POUR (IV SOLUTION) ×1 IMPLANT

## 2022-05-14 NOTE — Telephone Encounter (Signed)
Would she like referral to neurology and psychiatry?

## 2022-05-14 NOTE — Interval H&P Note (Signed)
History and Physical Interval Note:  05/14/2022 12:57 PM  Kaitlyn Serrano  has presented today for surgery, with the diagnosis of Left hip osteoarthritis.  The various methods of treatment have been discussed with the patient and family. After consideration of risks, benefits and other options for treatment, the patient has consented to  Procedure(s): TOTAL HIP ARTHROPLASTY ANTERIOR APPROACH (Left) as a surgical intervention.  The patient's history has been reviewed, patient examined, no change in status, stable for surgery.  I have reviewed the patient's chart and labs.  Questions were answered to the patient's satisfaction.     Mauri Pole

## 2022-05-14 NOTE — Anesthesia Postprocedure Evaluation (Signed)
Anesthesia Post Note  Patient: Kaitlyn Serrano  Procedure(s) Performed: TOTAL HIP ARTHROPLASTY ANTERIOR APPROACH (Left: Hip)     Patient location during evaluation: PACU Anesthesia Type: Spinal Level of consciousness: oriented and awake and alert Pain management: pain level controlled Vital Signs Assessment: post-procedure vital signs reviewed and stable Respiratory status: spontaneous breathing, respiratory function stable and nonlabored ventilation Cardiovascular status: blood pressure returned to baseline and stable Postop Assessment: no headache, no backache, no apparent nausea or vomiting, spinal receding and patient able to bend at knees Anesthetic complications: no   No notable events documented.  Last Vitals:  Vitals:   05/14/22 1207 05/14/22 1546  BP: (!) 161/72 (!) 167/65  Pulse: 63 63  Resp:  12  Temp: 36.5 C (!) 36.4 C  SpO2: 100% 100%    Last Pain:  Vitals:   05/14/22 1603  TempSrc:   PainSc: 7                  Sholom Dulude A.

## 2022-05-14 NOTE — Anesthesia Preprocedure Evaluation (Signed)
Anesthesia Evaluation  Patient identified by MRN, date of birth, ID band Patient awake    Reviewed: Allergy & Precautions, NPO status , Patient's Chart, lab work & pertinent test results, reviewed documented beta blocker date and time   History of Anesthesia Complications (+) PONV and history of anesthetic complications  Airway Mallampati: II  TM Distance: >3 FB Neck ROM: Full    Dental  (+) Edentulous Upper, Edentulous Lower   Pulmonary shortness of breath and with exertion, former smoker   Pulmonary exam normal breath sounds clear to auscultation       Cardiovascular hypertension, Normal cardiovascular exam Rhythm:Regular Rate:Normal     Neuro/Psych  PSYCHIATRIC DISORDERS Anxiety Depression    Poor memory   GI/Hepatic Neg liver ROS,GERD  Medicated,,  Endo/Other  Hypercholesterolemia  Renal/GU Renal disease  negative genitourinary   Musculoskeletal  (+) Arthritis , Osteoarthritis,  OA left hip   Abdominal   Peds  Hematology negative hematology ROS (+)   Anesthesia Other Findings   Reproductive/Obstetrics                              Anesthesia Physical Anesthesia Plan  ASA: 3  Anesthesia Plan: General   Post-op Pain Management: Minimal or no pain anticipated and Regional block*   Induction: Intravenous  PONV Risk Score and Plan: Treatment may vary due to age or medical condition and Propofol infusion  Airway Management Planned: Natural Airway and Simple Face Mask  Additional Equipment: None  Intra-op Plan:   Post-operative Plan:   Informed Consent: I have reviewed the patients History and Physical, chart, labs and discussed the procedure including the risks, benefits and alternatives for the proposed anesthesia with the patient or authorized representative who has indicated his/her understanding and acceptance.     Dental advisory given  Plan Discussed with: CRNA and  Anesthesiologist  Anesthesia Plan Comments:          Anesthesia Quick Evaluation

## 2022-05-14 NOTE — Care Plan (Signed)
Ortho Bundle Case Management Note  Patient Details  Name: Kaitlyn Serrano MRN: [id number] Date of Birth: June 12, 1940                   L THA on 05/14/22. DCP: Home with daughter [name]. DME: No needs. Has RW. PT: HEP   DME Arranged:  N/A DME Agency:      Additional Comments: Please contact me with any questions of if this plan should need to change.    Dario Ave, Case Manager   [facility]  [phone] 05/14/2022, 5:10 PM

## 2022-05-14 NOTE — Telephone Encounter (Signed)
Daughter called and states that medication for mood and dementia are not helping. Symptoms are getting worse and patient is waking up in the middle of the night not knowing where she is and leaving the house.

## 2022-05-14 NOTE — Transfer of Care (Signed)
Immediate Anesthesia Transfer of Care Note  Patient: Kaitlyn Serrano  Procedure(s) Performed: TOTAL HIP ARTHROPLASTY ANTERIOR APPROACH (Left: Hip)  Patient Location: PACU  Anesthesia Type:Spinal  Level of Consciousness: awake  Airway & Oxygen Therapy: Patient Spontanous Breathing and Patient connected to face mask oxygen  Post-op Assessment: Report given to RN and Post -op Vital signs reviewed and stable  Post vital signs: Reviewed and stable  Last Vitals:  Vitals Value Taken Time  BP 167/65 05/14/22 1546  Temp    Pulse 61 05/14/22 1553  Resp 19 05/14/22 1553  SpO2 100 % 05/14/22 1553  Vitals shown include unvalidated device data.  Last Pain:  Vitals:   05/14/22 1207  TempSrc: Oral         Complications: No notable events documented.

## 2022-05-14 NOTE — Anesthesia Procedure Notes (Signed)
Spinal  Patient location during procedure: OR Start time: 05/14/2022 2:58 PM Reason for block: surgical anesthesia Staffing Resident/CRNA: Timoteo Expose, CRNA Performed by: Timoteo Expose, CRNA Authorized by: Josephine Igo, MD   Preanesthetic Checklist Completed: patient identified, IV checked, site marked, risks and benefits discussed, surgical consent, monitors and equipment checked, pre-op evaluation and timeout performed Spinal Block Patient position: sitting Prep: DuraPrep Patient monitoring: heart rate, cardiac monitor, continuous pulse ox and blood pressure Approach: midline Location: L3-4 Injection technique: single-shot Needle Needle type: Pencan  Needle gauge: 24 G Needle length: 10 cm Assessment Sensory level: T10 Events: CSF return

## 2022-05-14 NOTE — Discharge Instructions (Signed)

## 2022-05-14 NOTE — Op Note (Signed)
NAME:  Kaitlyn Serrano                ACCOUNT NO.: [id number]      MEDICAL RECORD NO.: [id number]      FACILITY:  [facility]      PHYSICIAN:  Mauri Pole  DATE OF BIRTH:  1941-02-21     DATE OF PROCEDURE:  05/14/2022                                 OPERATIVE REPORT         PREOPERATIVE DIAGNOSIS: Left  hip osteoarthritis.      POSTOPERATIVE DIAGNOSIS:  Left hip osteoarthritis.      PROCEDURE:  Left total hip replacement through an anterior approach   utilizing DePuy THR system, component size 50 mm pinnacle cup, a size 32+4 neutral   Altrex liner, a size 4 Hi Actis stem with a 32+1 Articuleze metal head ball.      SURGEON:  Pietro Cassis. Alvan Dame, M.D.      ASSISTANT:  Costella Hatcher, PA-C     ANESTHESIA:  Spinal.      SPECIMENS:  None.      COMPLICATIONS:  None.      BLOOD LOSS:  550 cc     DRAINS:  None.      INDICATION OF THE PROCEDURE:  Kaitlyn Serrano is a 82 y.o. female who had   presented to office for evaluation of left hip pain.  Radiographs revealed   progressive degenerative changes with bone-on-bone   articulation of the  hip joint, including subchondral cystic changes and osteophytes.  The patient had painful limited range of   motion significantly affecting their overall quality of life and function.  The patient was failing to    respond to conservative measures including medications and/or injections and activity modification and at this point was ready   to proceed with more definitive measures.  Consent was obtained for   benefit of pain relief.  Specific risks of infection, DVT, component   failure, dislocation, neurovascular injury, and need for revision surgery were reviewed in the office.     PROCEDURE IN DETAIL:  The patient was brought to operative theater.   Once adequate anesthesia, preoperative antibiotics, 2 gm of Ancef, 1 gm of Tranexamic Acid, and 10 mg of Decadron were administered, the patient was positioned supine on the  [employer] table.  Once the patient was safely positioned with adequate padding of boney prominences we predraped out the hip, and used fluoroscopy to confirm orientation of the pelvis.      The left hip was then prepped and draped from proximal iliac crest to   mid thigh with a shower curtain technique.      Time-out was performed identifying the patient, planned procedure, and the appropriate extremity.     An incision was then made 2 cm lateral to the   anterior superior iliac spine extending over the orientation of the   tensor fascia lata muscle and sharp dissection was carried down to the   fascia of the muscle.      The fascia was then incised.  The muscle belly was identified and swept   laterally and retractor placed along the superior neck.  Following   cauterization of the circumflex vessels and removing some pericapsular   fat, a second cobra retractor was placed on the inferior neck.  A T-capsulotomy was made along the line of the   superior neck to the trochanteric fossa, then extended proximally and   distally.  Tag sutures were placed and the retractors were then placed   intracapsular.  We then identified the trochanteric fossa and   orientation of my neck cut and then made a neck osteotomy with the femur on traction.  The femoral   head was removed without difficulty or complication.  Traction was let   off and retractors were placed posterior and anterior around the   acetabulum.      The labrum and foveal tissue were debrided.  I began reaming with a 45 mm   reamer and reamed up to 49 mm reamer with good bony bed preparation and a 50 mm  cup was chosen.  The final 50 mm Pinnacle cup was then impacted under fluoroscopy to confirm the depth of penetration and orientation with respect to   Abduction and forward flexion.  A screw was placed into the ilium followed by the hole eliminator.  The final   32+4 neutral Altrex liner was impacted with good visualized rim fit.  The  cup was positioned anatomically within the acetabular portion of the pelvis.      At this point, the femur was rolled to 100 degrees.  Further capsule was   released off the inferior aspect of the femoral neck.  I then   released the superior capsule proximally.  With the leg in a neutral position the hook was placed laterally   along the femur under the vastus lateralis origin and elevated manually and then held in position using the hook attachment on the bed.  The leg was then extended and adducted with the leg rolled to 100   degrees of external rotation.  Retractors were placed along the medial calcar and posteriorly over the greater trochanter.  Once the proximal femur was fully   exposed, I used a box osteotome to set orientation.  I then began   broaching with the starting chili pepper broach and passed this by hand and then broached up to 4.  With the 4 broach in place I chose a high offset neck and did several trial reductions.  The offset was appropriate, leg lengths   appeared to be equal best matched with the +1 head ball trial confirmed radiographically.   Given these findings, I went ahead and dislocated the hip, repositioned all   retractors and positioned the right hip in the extended and abducted position.  The final 4 Hi Actis stem was   chosen and it was impacted down to the level of neck cut.  Based on this   and the trial reductions, a final 32+1 Articuleze metal head ball was chosen and   impacted onto a clean and dry trunnion, and the hip was reduced.  The   hip had been irrigated throughout the case again at this point.  I did   reapproximate the superior capsular leaflet to the anterior leaflet   using #1 Vicryl.  The fascia of the   tensor fascia lata muscle was then reapproximated using #1 Vicryl and #0 Stratafix sutures.  The   remaining wound was closed with 2-0 Vicryl and running 4-0 Monocryl.   The hip was cleaned, dried, and dressed sterilely using Dermabond and    Aquacel dressing.  The patient was then brought   to recovery room in stable condition tolerating the procedure well.    Costella Hatcher,  PA-C was present for the entirety of the case involved from   preoperative positioning, perioperative retractor management, general   facilitation of the case, as well as primary wound closure as assistant.            Pietro Cassis Alvan Dame, M.D.        05/14/2022 12:58 PM

## 2022-05-14 NOTE — Progress Notes (Signed)
Patient alert and oriented x 4.  States "I am having my left hip replaced by Dr. Alvan Dame".

## 2022-05-15 ENCOUNTER — Encounter (HOSPITAL_COMMUNITY): Payer: Self-pay | Admitting: Orthopedic Surgery

## 2022-05-15 DIAGNOSIS — I10 Essential (primary) hypertension: Secondary | ICD-10-CM | POA: Diagnosis not present

## 2022-05-15 DIAGNOSIS — Z87891 Personal history of nicotine dependence: Secondary | ICD-10-CM | POA: Diagnosis not present

## 2022-05-15 DIAGNOSIS — M1612 Unilateral primary osteoarthritis, left hip: Secondary | ICD-10-CM | POA: Diagnosis not present

## 2022-05-15 DIAGNOSIS — Z79899 Other long term (current) drug therapy: Secondary | ICD-10-CM | POA: Diagnosis not present

## 2022-05-15 DIAGNOSIS — Z96641 Presence of right artificial hip joint: Secondary | ICD-10-CM | POA: Diagnosis not present

## 2022-05-15 LAB — BASIC METABOLIC PANEL
Anion gap: 9 (ref 5–15)
BUN: 10 mg/dL (ref 8–23)
CO2: 25 mmol/L (ref 22–32)
Calcium: 8.8 mg/dL — ABNORMAL LOW (ref 8.9–10.3)
Chloride: 100 mmol/L (ref 98–111)
Creatinine, Ser: 0.64 mg/dL (ref 0.44–1.00)
GFR, Estimated: >60 mL/min (ref >60)
Glucose, Bld: 175 mg/dL — ABNORMAL HIGH (ref 70–99)
Potassium: 3.7 mmol/L (ref 3.5–5.1)
Sodium: 134 mmol/L — ABNORMAL LOW (ref 135–145)

## 2022-05-15 LAB — CBC
HCT: 28.4 % — ABNORMAL LOW (ref 36.0–46.0)
Hemoglobin: 9.4 g/dL — ABNORMAL LOW (ref 12.0–15.0)
MCH: 30.3 pg (ref 26.0–34.0)
MCHC: 33.1 g/dL (ref 30.0–36.0)
MCV: 91.6 fL (ref 80.0–100.0)
Platelets: 227 10*3/uL (ref 150–400)
RBC: 3.1 MIL/uL — ABNORMAL LOW (ref 3.87–5.11)
RDW: 13.2 % (ref 11.5–15.5)
WBC: 18.6 10*3/uL — ABNORMAL HIGH (ref 4.0–10.5)
nRBC: 0 % (ref 0.0–0.2)

## 2022-05-15 MED ORDER — ASPIRIN 81 MG PO CHEW: 81.0000 mg | CHEWABLE_TABLET | Freq: Two times a day (BID) | ORAL | 0 refills | Status: AC

## 2022-05-15 MED ORDER — POLYETHYLENE GLYCOL 3350 17 G PO PACK: 17.0000 g | PACK | Freq: Two times a day (BID) | ORAL | 0 refills | Status: DC

## 2022-05-15 NOTE — Progress Notes (Addendum)
   Subjective: 1 Day Post-Op Procedure(s) (LRB): TOTAL HIP ARTHROPLASTY ANTERIOR APPROACH (Left) Patient reports pain as mild.   Patient seen in rounds with Dr. Alvan Dame. Patient is resting in bed on exam this morning. History of dementia, but alert and at baseline this morning. No acute events overnight. Foley catheter removed. Patient has not been up with PT yesterday. We will start therapy today.   Objective: Vital signs in last 24 hours: Temp:  [97.3 F (36.3 C)-98.4 F (36.9 C)] 97.3 F (36.3 C) (03/20 0701) Pulse Rate:  [63-86] 77 (03/20 0701) Resp:  [12-19] 15 (03/20 0701) BP: (132-209)/(57-177) 151/67 (03/20 0701) SpO2:  [94 %-100 %] 99 % (03/20 0701) Weight:  [49 kg] 49 kg (03/19 1222)  Intake/Output from previous day:  Intake/Output Summary (Last 24 hours) at 05/15/2022 0832 Last data filed at 05/15/2022 0655 Gross per 24 hour  Intake 3357.53 ml  Output 3050 ml  Net 307.53 ml     Intake/Output this shift: No intake/output data recorded.  Labs: Recent Labs    05/15/22 0310  HGB 9.4*   Recent Labs    05/15/22 0310  WBC 18.6*  RBC 3.10*  HCT 28.4*  PLT 227   Recent Labs    05/15/22 0310  NA 134*  K 3.7  CL 100  CO2 25  BUN 10  CREATININE 0.64  GLUCOSE 175*  CALCIUM 8.8*   No results for input(s): "LABPT", "INR" in the last 72 hours.  Exam: General - Patient is Alert and Appropriate Extremity - Neurologically intact Sensation intact distally Intact pulses distally Dorsiflexion/Plantar flexion intact Dressing - dressing C/D/I Motor Function - intact, moving foot and toes well on exam.   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 family unaware   Dysphagia    Dyspnea    Generalized weakness    GERD (gastroesophageal reflux disease)    GERD (gastroesophageal  reflux disease)    High cholesterol    Hoarseness    Hx of cardiovascular stress test    negative 2012 and 2016   Hypertension    Insomnia    Intervertebral disc disorder with myelopathy, lumbar region    Lumbago    Lumbar spine pain    PONV (postoperative nausea and vomiting)    confusion after anesthesia   Renal insufficiency    Ringing in ears    resolved   Spondylosis with myelopathy, lumbar region    legs, back    Assessment/Plan: 1 Day Post-Op Procedure(s) (LRB): TOTAL HIP ARTHROPLASTY ANTERIOR APPROACH (Left) Principal Problem:   S/P total left hip arthroplasty  Estimated body mass index is 19.76 kg/m as calculated from the following:   Height as of this encounter: 5\' 2"  (1.575 m).   Weight as of this encounter: 49 kg. Advance diet Up with therapy D/C IV fluids  DVT Prophylaxis - Aspirin Weight bearing as tolerated.  Hgb stable at 9.4 this AM.  Plan is to go Home after hospital stay. Plan for discharge today after meeting goals with therapy. Follow up in the office in 2 weeks.   Griffith Citron, PA-C Orthopedic Surgery [phone] 05/15/2022, 8:32 AM

## 2022-05-15 NOTE — Progress Notes (Addendum)
Physical Therapy Treatment Patient Details Name: Kaitlyn Serrano MRN: [id number] DOB: 27-Nov-1940 Today's Date: 05/15/2022   History of Present Illness Pt is 82 yo female s/p L THA on 05/15/22.  Pt with hx including but not limited to R THA 2021, GERD, HTN, HLD, anxiety and depression, back pain with prior sxt    PT Comments    Pt is POD # 1 and is progressing well. Daughters were present this afternoon and report that pt has DME and that this is her baseline cognition.  They report she has 24 hr supervision.  Pt and daughters are eager/in hurry to d/c. Provided with education on safety and written HEP.  They verbalize understanding and are comfortable with all mobility and safety concerns - state she had R THA 2 years ago.  Pt demonstrates safe gait & transfers in order to return home with close supervision from family from PT perspective once discharged by MD.  While in hospital, will continue to benefit from PT for skilled therapy to advance mobility and exercises.      Recommendations for follow up therapy are one component of a multi-disciplinary discharge planning process, led by the attending physician.  Recommendations may be updated based on patient status, additional functional criteria and insurance authorization.  Follow Up Recommendations  Follow physician's recommendations for discharge plan and follow up therapies     Assistance Recommended at Discharge Frequent or constant Supervision/Assistance  Patient can return home with the following A little help with walking and/or transfers;A little help with bathing/dressing/bathroom;Assistance with cooking/housework;Help with stairs or ramp for entrance   Equipment Recommendations  None recommended by PT (daughter confirm has RW)    Recommendations for Other Services       Precautions / Restrictions Precautions Precautions: Fall Restrictions LLE Weight Bearing: Weight bearing as tolerated     Mobility  Bed Mobility Overal  bed mobility: Needs Assistance Bed Mobility: Supine to Sit     Supine to sit: Min assist     General bed mobility comments: sitting at arrival    Transfers Overall transfer level: Needs assistance Equipment used: Rolling walker (2 wheels) Transfers: Sit to/from Stand Sit to Stand: Supervision           General transfer comment: close supervision; performed x 3 during session; stands impulsively; recommended close supervision at home    Ambulation/Gait Ambulation/Gait assistance: Supervision Gait Distance (Feet): 150 Feet Assistive device: Rolling walker (2 wheels) Gait Pattern/deviations: Step-through pattern Gait velocity: too fast at times (particularly for post op)     General Gait Details: Pt impulsive at times requiring cues to go at safe speed, keep hands on RW, and to avoid objects with RW.  Daughters present and report this is her baseline and that "you can't slow her down."  Advised close supervision and assist with mobility   Stairs             Wheelchair Mobility    Modified Rankin (Stroke Patients Only)       Balance Overall balance assessment: Needs assistance Sitting-balance support: No upper extremity supported Sitting balance-Leahy Scale: Good     Standing balance support: Bilateral upper extremity supported, No upper extremity supported Standing balance-Leahy Scale: Fair Standing balance comment: RW to ambulate but could static stand without UE support                            Cognition Arousal/Alertness: Awake/alert Behavior During Therapy: Impulsive Overall Cognitive  Status: History of cognitive impairments - at baseline                                 General Comments: Daughters present ad report this is pts baseline - confusion, short term memory loss, impulsive        Exercises Total Joint Exercises Ankle Circles/Pumps: AROM, 10 reps, Seated Quad Sets: AROM, Both, 5 reps, Seated  Long Arc Quad:  AROM, Left, 10 reps, Seated    General Comments General comments (skin integrity, edema, etc.): Daughters present  Educated on safe ice use, no pivots, car transfers, TED hose during day. Also, encouraged walking every 1-2 hours during day. Educated on HEP with focus on mobility the first weeks. Discussed doing exercises within pain control and if pain increasing could decreased ROM, reps, and stop exercises as needed. Provided written HEP that progressed to standing exercises with supervision next week. Pt and daughter verbalized understanding and with no further questions.       Pertinent Vitals/Pain Pain Assessment Pain Assessment: Faces Faces Pain Scale: Hurts a little bit Pain Location: L hip with transfers Pain Descriptors / Indicators: Grimacing Pain Intervention(s): Limited activity within patient's tolerance, Monitored during session, Repositioned    Home Living Family/patient expects to be discharged to:: Private residence Living Arrangements: Children (2 daughters) Available Help at Discharge: Family;Available 24 hours/day Type of Home: House Home Access: Level entry       Home Layout: One level Home Equipment: Cane - single Barista (2 wheels);BSC/3in1 Additional Comments: Pt somewhat questionable historian (frequently repeats self, decreased safety) need to confirm 24 hr assist and DME with family    Prior Function            PT Goals (current goals can now be found in the care plan section) Acute Rehab PT Goals Patient Stated Goal: return home PT Goal Formulation: With patient/family Time For Goal Achievement: 05/29/22 Potential to Achieve Goals: Good Progress towards PT goals: Progressing toward goals    Frequency    7X/week      PT Plan Current plan remains appropriate    Co-evaluation              AM-PAC PT "6 Clicks" Mobility   Outcome Measure  Help needed turning from your back to your side while in a flat bed without using  bedrails?: A Little Help needed moving from lying on your back to sitting on the side of a flat bed without using bedrails?: A Little Help needed moving to and from a bed to a chair (including a wheelchair)?: A Little Help needed standing up from a chair using your arms (e.g., wheelchair or bedside chair)?: A Little Help needed to walk in hospital room?: A Little Help needed climbing 3-5 steps with a railing? : A Little 6 Click Score: 18    End of Session Equipment Utilized During Treatment: Gait belt Activity Tolerance: Patient tolerated treatment well Patient left: Other (comment) (in transport chair with staff for d/c) Nurse Communication: Mobility status PT Visit Diagnosis: Other abnormalities of gait and mobility (R26.89);Muscle weakness (generalized) (M62.81)     Time: 1400-1416 PT Time Calculation (min) (ACUTE ONLY): 16 min  Charges:  $Gait Training: 8-22 mins                     Abran Richard, PT Acute Rehab [facility] 05/15/2022,  2:26 PM

## 2022-05-15 NOTE — TOC Transition Note (Signed)
Transition of Care [facility]) - CM/SW Discharge Note  Patient Details  Name: Kaitlyn Serrano MRN: [id number] Date of Birth: 1940/11/07  Transition of Care [facility] CM/SW Contact:  Sherie Don, LCSW Phone Number: 05/15/2022, 9:54 AM  Clinical Narrative: Patient is expected to discharge home after working with PT. CSW met with patient to review discharge plan. Patient will discharge home with a home exercise program (HEP). Patient has a rolling walker at home, so there are no DME needs at this time. TOC signing off.  Final next level of care: Home/Self Care Barriers to Discharge: No Barriers Identified  Patient Goals and CMS Choice Choice offered to / list presented to : NA  Discharge Plan and Services Additional resources added to the After Visit Summary for         DME Arranged: N/A DME Agency: NA  Social Determinants of Health (SDOH) Interventions SDOH Screenings   Food Insecurity: No Food Insecurity (05/14/2022)  Housing: Low Risk  (05/14/2022)  Transportation Needs: No Transportation Needs (05/14/2022)  Utilities: Not At Risk (05/14/2022)  Alcohol Screen: Low Risk  (03/28/2021)  Depression (PHQ2-9): High Risk (05/07/2022)  Financial Resource Strain: Low Risk  (03/28/2021)  Physical Activity: Insufficiently Active (03/28/2021)  Social Connections: Moderately Integrated (03/28/2021)  Stress: No Stress Concern Present (03/28/2021)  Tobacco Use: Medium Risk (05/14/2022)   Readmission Risk Interventions     No data to display

## 2022-05-15 NOTE — Progress Notes (Signed)
Patient discharged to home w/ daughters. All belongings, instructions w/ patient. Dtrs verbalized understanding of instructions and safety. Escorted to pov via w/c.

## 2022-05-15 NOTE — Evaluation (Addendum)
Physical Therapy Evaluation Patient Details Name: Kaitlyn Serrano MRN: [id number] DOB: [birth year] Today's Date: 05/15/2022  History of Present Illness  Pt is 82 yo female s/p L THA on 05/15/22.  Pt with hx including but not limited to R THA 2021, GERD, HTN, HLD, anxiety and depression, back pain with prior sxt  Clinical Impression  Pt is s/p THA resulting in the deficits listed below (see PT Problem List). Pt is somewhat questionable historian but does report she will have 24 hr supervision, has RW, and was independent at baseline using a walker.  Today, pt with decreased safety awareness , impulsive, repeats self, and with decreased problem solving; however, she appeared to have good pain control and moved well.  Pt was able to ambulate 150' with RW and min guard and occasional assist to avoid objects.  Attempted to call daughters but no answer at numbers in chart.  If this is baseline cognition and pt has support -could likely return home later today from PT perspective.  Will f/u in afternoon when daughters present.  Pt will benefit from skilled PT to increase their independence and safety with mobility to allow discharge to the venue listed below.         Recommendations for follow up therapy are one component of a multi-disciplinary discharge planning process, led by the attending physician.  Recommendations may be updated based on patient status, additional functional criteria and insurance authorization.  Follow Up Recommendations Follow physician's recommendations for discharge plan and follow up therapies      Assistance Recommended at Discharge Frequent or constant Supervision/Assistance  Patient can return home with the following  A little help with walking and/or transfers;A little help with bathing/dressing/bathroom;Assistance with cooking/housework;Help with stairs or ramp for entrance    Equipment Recommendations Rolling walker (2 wheels) (need to confirm w daughters that she has  RW)  Recommendations for Other Services       Functional Status Assessment Patient has had a recent decline in their functional status and demonstrates the ability to make significant improvements in function in a reasonable and predictable amount of time.     Precautions / Restrictions Precautions Precautions: Fall Restrictions LLE Weight Bearing: Weight bearing as tolerated      Mobility  Bed Mobility Overal bed mobility: Needs Assistance Bed Mobility: Supine to Sit     Supine to sit: Min assist     General bed mobility comments: increased time and assist for R LE    Transfers Overall transfer level: Needs assistance Equipment used: Rolling walker (2 wheels) Transfers: Sit to/from Stand Sit to Stand: Min guard           General transfer comment: Min guard safety; performed x 3; cues for hand placement    Ambulation/Gait Ambulation/Gait assistance: Min guard, Min assist Gait Distance (Feet): 150 Feet Assistive device: Rolling walker (2 wheels) Gait Pattern/deviations: Step-through pattern Gait velocity: too fast at times (particularly for post op)     General Gait Details: Pt impulsive and fast needing cues to slow at times.  Overall needed min guard but occasionally requiring min A to guide RW around obstacle.  Did note L LE slightly internally rotates compared to R. Did need cues to keep hands on RW while moving.  Stairs            Wheelchair Mobility    Modified Rankin (Stroke Patients Only)       Balance Overall balance assessment: Needs assistance Sitting-balance support: No upper extremity supported Sitting  balance-Leahy Scale: Good     Standing balance support: Bilateral upper extremity supported, No upper extremity supported Standing balance-Leahy Scale: Fair Standing balance comment: RW to ambulate but could static stand without UE support                             Pertinent Vitals/Pain Pain Assessment Pain  Assessment: Faces Faces Pain Scale: Hurts a little bit Pain Location: L hip with transfers Pain Descriptors / Indicators: Grimacing Pain Intervention(s): Limited activity within patient's tolerance, Monitored during session, Repositioned    Home Living Family/patient expects to be discharged to:: Private residence Living Arrangements: Children (2 daughters) Available Help at Discharge: Family;Available 24 hours/day Type of Home: House Home Access: Level entry       Home Layout: One level Home Equipment: Cane - single Barista (2 wheels);BSC/3in1 Additional Comments: Pt somewhat questionable historian (frequently repeats self, decreased safety) need to confirm 24 hr assist and DME with family    Prior Function Prior Level of Function : Independent/Modified Independent             Mobility Comments: Reporst ambulates in community with RW ADLs Comments: independent adls and iadls     Hand Dominance        Extremity/Trunk Assessment   Upper Extremity Assessment Upper Extremity Assessment: Overall WFL for tasks assessed    Lower Extremity Assessment Lower Extremity Assessment: Overall WFL for tasks assessed    Cervical / Trunk Assessment Cervical / Trunk Assessment: Normal  Communication   Communication: No difficulties  Cognition Arousal/Alertness: Awake/alert Behavior During Therapy: Impulsive Overall Cognitive Status: No family/caregiver present to determine baseline cognitive functioning                                 General Comments: Pt oriented x4 but is impulsive, has decreased safety awareness, and frequently repeats self.  Also, decreased problem solving (how to sequence transfers, how to call for assist, etc)        General Comments General comments (skin integrity, edema, etc.): Attempted to call daughters using numbers in chart , no answer    Exercises  Total Joint Exercises Ankle Circles/Pumps: AROM, 10 reps,  Seated Quad Sets: AROM, Both, 5 reps, Seated Heel Slides: AAROM, Left, 10 reps, Supine Hip ABduction/ADduction: AAROM, Left, 10 reps, Supine Long Arc Quad: AROM, Left, 10 reps, Seated   Assessment/Plan    PT Assessment Patient needs continued PT services  PT Problem List Decreased strength;Decreased range of motion;Decreased activity tolerance;Decreased balance;Decreased mobility;Decreased knowledge of precautions;Decreased safety awareness;Decreased knowledge of use of DME;Decreased cognition;Pain       PT Treatment Interventions DME instruction;Therapeutic exercise;Gait training;Balance training;Stair training;Functional mobility training;Therapeutic activities;Patient/family education;Cognitive remediation    PT Goals (Current goals can be found in the Care Plan section)  Acute Rehab PT Goals Patient Stated Goal: return home PT Goal Formulation: With patient/family Time For Goal Achievement: 05/29/22 Potential to Achieve Goals: Good    Frequency 7X/week     Co-evaluation               AM-PAC PT "6 Clicks" Mobility  Outcome Measure Help needed turning from your back to your side while in a flat bed without using bedrails?: A Little Help needed moving from lying on your back to sitting on the side of a flat bed without using bedrails?: A Little Help needed moving to and from  a bed to a chair (including a wheelchair)?: A Little Help needed standing up from a chair using your arms (e.g., wheelchair or bedside chair)?: A Little Help needed to walk in hospital room?: A Little Help needed climbing 3-5 steps with a railing? : A Little 6 Click Score: 18    End of Session Equipment Utilized During Treatment: Gait belt Activity Tolerance: Patient tolerated treatment well Patient left: with chair alarm set;in chair;with call bell/phone within reach Nurse Communication: Mobility status PT Visit Diagnosis: Other abnormalities of gait and mobility (R26.89);Muscle weakness  (generalized) (M62.81)    Time: [id number] PT Time Calculation (min) (ACUTE ONLY): 24 min   Charges:   PT Evaluation $PT Eval Low Complexity: 1 Low PT Treatments $Gait Training: 8-22 mins        Abran Richard, PT Acute Rehab [facility] [place] 05/15/2022, 1:53 PM

## 2022-05-16 NOTE — Discharge Summary (Signed)
Patient ID: Kaitlyn Serrano MRN: [id number] DOB/AGE: 03/19/1940 82 y.o.  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 family unaware   Dysphagia    Dyspnea    Generalized weakness    GERD (gastroesophageal reflux disease)    GERD (gastroesophageal reflux disease)    High cholesterol    Hoarseness    Hx of cardiovascular stress test    negative 2012 and 2016   Hypertension    Insomnia    Intervertebral disc disorder with myelopathy, lumbar region    Lumbago    Lumbar spine pain    PONV (postoperative nausea and vomiting)    confusion after anesthesia   Renal insufficiency    Ringing in ears    resolved   Spondylosis with myelopathy, lumbar region    legs, back    Surgeries: Procedure(s): TOTAL HIP ARTHROPLASTY ANTERIOR APPROACH on 05/14/2022   Consultants:   Discharged Condition: Improved  Hospital Course: Kaitlyn Serrano is an 82 y.o. female who was admitted 05/14/2022 for operative treatment ofS/P total left hip arthroplasty. Patient has severe unremitting pain that affects sleep, daily activities, and work/hobbies. After pre-op clearance the patient was taken to the operating room on 05/14/2022 and underwent  Procedure(s): TOTAL HIP ARTHROPLASTY ANTERIOR APPROACH.    Patient was given perioperative antibiotics:  Anti-infectives (From admission, onward)    Start     Dose/Rate Route Frequency Ordered Stop   05/15/22 0130  vancomycin (VANCOCIN) IVPB 1000 mg/200 mL premix        1,000 mg 200 mL/hr over 60 Minutes Intravenous Every 12 hours 05/14/22 1807 05/15/22 0203   05/14/22 1500  levofloxacin (LEVAQUIN) IVPB 500  mg  Status:  Discontinued        500 mg 100 mL/hr over 60 Minutes Intravenous  Once 05/14/22 1447 05/15/22 0754   05/14/22 1200  vancomycin (VANCOCIN) IVPB 1000 mg/200 mL premix        1,000 mg 200 mL/hr over 60 Minutes Intravenous On call to O.R. 05/14/22 1149 05/14/22 1423        Patient was given sequential compression devices, early ambulation, and chemoprophylaxis to prevent DVT. Patient worked with PT and was meeting their goals regarding safe ambulation and transfers.  Patient benefited maximally from hospital stay and there were no complications.    Recent vital signs: Patient Vitals for the past 24 hrs:  BP Temp Temp src Pulse Resp SpO2  05/15/22 1300 (!) 155/71 97.7 F (36.5 C) -- 75 16 100 %  05/15/22 0937 (!) 149/65 97.6 F (36.4 C) Oral 83 16 100 %     Recent laboratory studies:  Recent Labs    05/15/22 0310  WBC 18.6*  HGB 9.4*  HCT 28.4*  PLT 227  NA 134*  K 3.7  CL 100  CO2 25  BUN 10  CREATININE 0.64  GLUCOSE 175*  CALCIUM 8.8*     Discharge Medications:   Allergies as of 05/15/2022       Reactions   Bee Venom Anaphylaxis   Hydrocodone Swelling   Penicillins Anaphylaxis, Rash   Has patient had a PCN reaction causing immediate  rash, facial/tongue/throat swelling, SOB or lightheadedness with hypotension: Yes Has patient had a PCN reaction causing severe rash involving mucus membranes or skin necrosis: Yes Has patient had a PCN reaction that required hospitalization: Yes Has patient had a PCN reaction occurring within the last 10 years: No If all of the above answers are "NO", then may proceed with Cephalosporin use.   Amlodipine Swelling   Elavil [amitriptyline Hcl] Other (See Comments)   Confusion, hallucinations   Statins Swelling, Other (See Comments)   Peeling of skin        Medication List     STOP taking these medications    acetaminophen 500 MG tablet Commonly known as: TYLENOL       TAKE these medications    aspirin 81  MG chewable tablet Chew 1 tablet (81 mg total) by mouth 2 (two) times daily for 28 days.   atenolol 25 MG tablet Commonly known as: TENORMIN Take 1 tablet (25 mg total) by mouth daily.   busPIRone 15 MG tablet Commonly known as: BUSPAR Take 1 tablet (15 mg total) by mouth 3 (three) times daily.   celecoxib 200 MG capsule Commonly known as: CELEBREX TAKE ONE CAPSULE DAILY WITH FOOD   cyclobenzaprine 10 MG tablet Commonly known as: FLEXERIL Take 10 mg by mouth 3 (three) times daily as needed for muscle spasms.   diltiazem 120 MG 24 hr capsule Commonly known as: Tiadylt ER TAKE 1 CAPSULE BY MOUTH EVERY DAY   docusate sodium 100 MG capsule Commonly known as: Colace Take 1 capsule (100 mg total) by mouth 2 (two) times daily.   donepezil 5 MG tablet Commonly known as: ARICEPT Take 1 tablet (5 mg total) by mouth at bedtime. For dementia   ezetimibe 10 MG tablet Commonly known as: ZETIA Take 1 tablet (10 mg total) by mouth daily.   Femring 0.05 MG/24HR Ring Generic drug: Estradiol Acetate INSERT VAGINALLY AS DIRECTED EVERY 3 MONTHS   fluticasone 50 MCG/ACT nasal spray Commonly known as: FLONASE USE TWO SPRAYS IN EACH NOSTRIL ONCE DAILY What changed: See the new instructions.   hydrALAZINE 25 MG tablet Commonly known as: APRESOLINE TAKE 1 TABLET BY MOUTH TWICE A DAY   HYDROcodone-acetaminophen 10-325 MG tablet Commonly known as: NORCO Take 1 tablet by mouth every 4 (four) hours as needed for severe pain.   ipratropium 0.06 % nasal spray Commonly known as: ATROVENT Place 1 spray into both nostrils 2 (two) times daily as needed for rhinitis.   lisinopril 40 MG tablet Commonly known as: ZESTRIL Take 1 tablet (40 mg total) by mouth daily.   loratadine 10 MG tablet Commonly known as: CLARITIN Take 1 tablet by mouth once daily   mirtazapine 7.5 MG tablet Commonly known as: REMERON Take 1 tablet (7.5 mg total) by mouth at bedtime. For anxiety/ appetite/ sleep    pantoprazole 40 MG tablet Commonly known as: PROTONIX TAKE 1 TABLET BY MOUTH ONCE DAILY AS NEEDED FOR ACID REFLUX What changed: See the new instructions.   polyethylene glycol 17 g packet Commonly known as: MIRALAX / GLYCOLAX Take 17 g by mouth 2 (two) times daily. What changed:  when to take this reasons to take this   polyethylene glycol 17 g packet Commonly known as: MIRALAX / GLYCOLAX Take 17 g by mouth 2 (two) times daily. What changed: You were already taking a medication with the same name, and this prescription was added. Make sure you understand how and when to take each.   tiZANidine 2  MG tablet Commonly known as: ZANAFLEX Take 2 mg by mouth 2 (two) times daily.               Discharge Care Instructions  (From admission, onward)           Start     Ordered   05/15/22 0000  Change dressing       Comments: Maintain surgical dressing until follow up in the clinic. If the edges start to pull up, may reinforce with tape. If the dressing is no longer working, may remove and cover with gauze and tape, but must keep the area dry and clean.  Call with any questions or concerns.   05/15/22 0839            Diagnostic Studies: DG Pelvis Portable  Result Date: 05/14/2022 CLINICAL DATA:  Postop hip EXAM: PORTABLE PELVIS 1 VIEWS COMPARISON:  X-ray 06/08/2019 preop FINDINGS: Bilateral hip arthroplasties are identified. Screw fixated acetabular cup and Press-Fit femoral stones. Expected alignment. There is soft tissue gas overlying the left hip. Osteopenia. Mild degenerative changes of the sacroiliac joints. Fixation hardware along the lumbar spine at the edge of the imaging field. Imaging was obtained to aid in treatment IMPRESSION: Bilateral hip arthroplasties.  Acute surgical changes on the left. Electronically Signed   By: Jill Side M.D.   On: 05/14/2022 16:11   DG HIP PORT UNILAT WITH PELVIS 1V LEFT  Result Date: 05/14/2022 CLINICAL DATA:  Status post hip  replacement EXAM: Intraoperative fluoroscopy COMPARISON:  Preop x-ray 11/19/2021 FINDINGS: Thirteen fluoroscopic spot images submitted for review demonstrate placement of a hip arthroplasty with a screw fixated acetabular cup and Press-Fit femoral stem. Expected alignment. Imaging was obtained to aid in treatment. Please correlate with real-time fluoroscopy of 0.1 minutes. Cumulative air kerma 0.6289 mGy IMPRESSION: Intraoperative fluoroscopy Electronically Signed   By: Jill Side M.D.   On: 05/14/2022 15:39   DG C-Arm 1-60 Min-No Report  Result Date: 05/14/2022 Fluoroscopy was utilized by the requesting physician.  No radiographic interpretation.    Disposition: Discharge disposition: 01-Home or Self Care       Discharge Instructions     Call MD / Call 911   Complete by: As directed    If you experience chest pain or shortness of breath, CALL 911 and be transported to the hospital emergency room.  If you develope a fever above 101 F, pus (white drainage) or increased drainage or redness at the wound, or calf pain, call your surgeon's office.   Change dressing   Complete by: As directed    Maintain surgical dressing until follow up in the clinic. If the edges start to pull up, may reinforce with tape. If the dressing is no longer working, may remove and cover with gauze and tape, but must keep the area dry and clean.  Call with any questions or concerns.   Constipation Prevention   Complete by: As directed    Drink plenty of fluids.  Prune juice may be helpful.  You may use a stool softener, such as Colace (over the counter) 100 mg twice a day.  Use MiraLax (over the counter) for constipation as needed.   Diet - low sodium heart healthy   Complete by: As directed    Increase activity slowly as tolerated   Complete by: As directed    Weight bearing as tolerated with assist device (walker, cane, etc) as directed, use it as long as suggested by your surgeon or therapist, typically at  least  4-6 weeks.   Post-operative opioid taper instructions:   Complete by: As directed    POST-OPERATIVE OPIOID TAPER INSTRUCTIONS: It is important to wean off of your opioid medication as soon as possible. If you do not need pain medication after your surgery it is ok to stop day one. Opioids include: Codeine, Hydrocodone(Norco, Vicodin), Oxycodone(Percocet, oxycontin) and hydromorphone amongst others.  Long term and even short term use of opiods can cause: Increased pain response Dependence Constipation Depression Respiratory depression And more.  Withdrawal symptoms can include Flu like symptoms Nausea, vomiting And more Techniques to manage these symptoms Hydrate well Eat regular healthy meals Stay active Use relaxation techniques(deep breathing, meditating, yoga) Do Not substitute Alcohol to help with tapering If you have been on opioids for less than two weeks and do not have pain than it is ok to stop all together.  Plan to wean off of opioids This plan should start within one week post op of your joint replacement. Maintain the same interval or time between taking each dose and first decrease the dose.  Cut the total daily intake of opioids by one tablet each day Next start to increase the time between doses. The last dose that should be eliminated is the evening dose.      TED hose   Complete by: As directed    Use stockings (TED hose) for 2 weeks on both leg(s).  You may remove them at night for sleeping.        Follow-up Information     Paralee Cancel, MD. Schedule an appointment as soon as possible for a visit in 2 week(s).   Specialty: Orthopedic Surgery Contact information: [address] [place] [address] [id number]                  Signed: Irving Copas 05/16/2022, 7:20 AM

## 2022-05-17 ENCOUNTER — Other Ambulatory Visit (HOSPITAL_COMMUNITY): Payer: Self-pay

## 2022-05-21 NOTE — Telephone Encounter (Signed)
Per [facility] pt agreed to referrals. Referrals placed on 05/15/22.

## 2022-05-22 DIAGNOSIS — M791 Myalgia, unspecified site: Secondary | ICD-10-CM | POA: Diagnosis not present

## 2022-05-22 DIAGNOSIS — M5106 Intervertebral disc disorders with myelopathy, lumbar region: Secondary | ICD-10-CM | POA: Diagnosis not present

## 2022-05-22 DIAGNOSIS — G894 Chronic pain syndrome: Secondary | ICD-10-CM | POA: Diagnosis not present

## 2022-05-27 ENCOUNTER — Telehealth: Payer: Self-pay | Admitting: Family Medicine

## 2022-05-27 NOTE — Telephone Encounter (Signed)
Called patient to schedule Medicare Annual Wellness Visit (AWV). Left message for patient to call back and schedule Medicare Annual Wellness Visit (AWV).  Last date of AWV: 03/28/2021  Please schedule an appointment at any time with either Mickel Baas or Lyman, NHA's. .  If any questions, please contact me at [phone].  Thank you,  Colletta Maryland,  [facility] Program Direct Dial ??[phone]

## 2022-06-14 ENCOUNTER — Telehealth: Payer: Self-pay | Admitting: Family Medicine

## 2022-06-14 NOTE — Telephone Encounter (Signed)
Contacted [name] to schedule their annual wellness visit. Appointment made for 06/17/2022.   Thank you,  Judeth Cornfield,  AMB Clinical Support [facility] AWV Program Direct Dial ??[phone]

## 2022-06-21 DIAGNOSIS — G894 Chronic pain syndrome: Secondary | ICD-10-CM | POA: Diagnosis not present

## 2022-06-21 DIAGNOSIS — M791 Myalgia, unspecified site: Secondary | ICD-10-CM | POA: Diagnosis not present

## 2022-06-21 DIAGNOSIS — M5106 Intervertebral disc disorders with myelopathy, lumbar region: Secondary | ICD-10-CM | POA: Diagnosis not present

## 2022-06-24 ENCOUNTER — Encounter: Payer: Self-pay | Admitting: Family Medicine

## 2022-06-24 ENCOUNTER — Ambulatory Visit (INDEPENDENT_AMBULATORY_CARE_PROVIDER_SITE_OTHER): Payer: Medicare PPO | Admitting: Family Medicine

## 2022-06-24 VITALS — BP 134/53 | HR 56 | Temp 98.3°F | Ht 62.0 in | Wt 108.0 lb

## 2022-06-24 DIAGNOSIS — F03B11 Unspecified dementia, moderate, with agitation: Secondary | ICD-10-CM

## 2022-06-24 DIAGNOSIS — Z96642 Presence of left artificial hip joint: Secondary | ICD-10-CM | POA: Diagnosis not present

## 2022-06-24 DIAGNOSIS — F411 Generalized anxiety disorder: Secondary | ICD-10-CM

## 2022-06-24 DIAGNOSIS — D649 Anemia, unspecified: Secondary | ICD-10-CM | POA: Diagnosis not present

## 2022-06-24 MED ORDER — DONEPEZIL HCL 10 MG PO TABS: 10.0000 mg | ORAL_TABLET | Freq: Every day | ORAL | 3 refills | Status: DC

## 2022-06-24 MED ORDER — BUSPIRONE HCL 10 MG PO TABS: 20.0000 mg | ORAL_TABLET | Freq: Two times a day (BID) | ORAL | 3 refills | Status: DC

## 2022-06-24 MED ORDER — MIRTAZAPINE 15 MG PO TABS: 15.0000 mg | ORAL_TABLET | Freq: Every day | ORAL | 3 refills | Status: DC

## 2022-06-24 NOTE — Progress Notes (Unsigned)
Subjective: CC:GAD PCP: Raliegh Ip, DO ZOX:[id number] T Blomberg is a 82 y.o. female presenting to clinic today for:  1. GAD Mirtazapine 7.5mg  at bedtime added last visit.  Buspar also increased.  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 ***   ROS: Per HPI  Allergies  Allergen Reactions   Bee Venom Anaphylaxis   Hydrocodone Swelling   Penicillins Anaphylaxis and Rash    Has patient had a PCN reaction causing immediate rash, facial/tongue/throat swelling, SOB or lightheadedness with hypotension: Yes Has patient had a PCN reaction causing severe rash involving mucus membranes or skin necrosis: Yes Has patient had a PCN reaction that required hospitalization: Yes Has patient had a PCN reaction occurring within the last 10 years: No If all of the above answers are "NO", then may proceed with Cephalosporin use.    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 family unaware   Dysphagia    Dyspnea    Generalized weakness    GERD (gastroesophageal reflux disease)    GERD (gastroesophageal reflux disease)    High cholesterol    Hoarseness    Hx of cardiovascular stress test    negative 2012 and 2016   Hypertension    Insomnia    Intervertebral disc disorder with myelopathy, lumbar region    Lumbago    Lumbar spine pain    PONV (postoperative nausea and vomiting)    confusion after anesthesia   Renal insufficiency    Ringing in ears    resolved   Spondylosis with myelopathy, lumbar region    legs, back    Current Outpatient Medications:    atenolol (TENORMIN) 25 MG tablet, Take 1 tablet (25 mg total) by mouth daily., Disp: 90 tablet,  Rfl: 3   busPIRone (BUSPAR) 15 MG tablet, Take 1 tablet (15 mg total) by mouth 3 (three) times daily., Disp: 270 tablet, Rfl: 3   celecoxib (CELEBREX) 200 MG capsule, TAKE ONE CAPSULE DAILY WITH FOOD, Disp: 30 capsule, Rfl: 2   cyclobenzaprine (FLEXERIL) 10 MG tablet, Take 10 mg by mouth 3 (three) times daily as needed for muscle spasms., Disp: , Rfl:    diltiazem (TIADYLT ER) 120 MG 24 hr capsule, TAKE 1 CAPSULE BY MOUTH EVERY DAY, Disp: 90 capsule, Rfl: 3   docusate sodium (COLACE) 100 MG capsule, Take 1 capsule (100 mg total) by mouth 2 (two) times daily., Disp: 28 capsule, Rfl: 0   donepezil (ARICEPT) 5 MG tablet, Take 1 tablet (5 mg total) by mouth at bedtime. For dementia, Disp: 90 tablet, Rfl: 3   ezetimibe (ZETIA) 10 MG tablet, Take 1 tablet (10 mg total) by mouth daily., Disp: 30 tablet, Rfl: 2   FEMRING 0.05 MG/24HR RING, INSERT VAGINALLY AS DIRECTED EVERY 3 MONTHS, Disp: 1 each, Rfl: 2   fluticasone (FLONASE) 50 MCG/ACT nasal spray, USE TWO SPRAYS IN EACH NOSTRIL ONCE DAILY (Patient taking differently: Place 1 spray into both nostrils daily as needed for allergies.), Disp: 48 mL, Rfl: 1   hydrALAZINE (APRESOLINE) 25 MG tablet, TAKE 1 TABLET BY MOUTH TWICE A DAY, Disp: 60 tablet, Rfl: 0  HYDROcodone-acetaminophen (NORCO) 10-325 MG tablet, Take 1 tablet by mouth every 4 (four) hours as needed for severe pain., Disp: , Rfl:    ipratropium (ATROVENT) 0.06 % nasal spray, Place 1 spray into both nostrils 2 (two) times daily as needed for rhinitis., Disp: , Rfl:    lisinopril (ZESTRIL) 40 MG tablet, Take 1 tablet (40 mg total) by mouth daily., Disp: 90 tablet, Rfl: 3   loratadine (CLARITIN) 10 MG tablet, Take 1 tablet by mouth once daily, Disp: 90 tablet, Rfl: 0   mirtazapine (REMERON) 7.5 MG tablet, Take 1 tablet (7.5 mg total) by mouth at bedtime. For anxiety/ appetite/ sleep, Disp: 90 tablet, Rfl: 3   pantoprazole (PROTONIX) 40 MG tablet, TAKE 1 TABLET BY MOUTH ONCE DAILY AS NEEDED FOR ACID  REFLUX (Patient taking differently: Take 40 mg by mouth daily.), Disp: 90 tablet, Rfl: 3   polyethylene glycol (MIRALAX / GLYCOLAX) 17 g packet, Take 17 g by mouth 2 (two) times daily. (Patient taking differently: Take 17 g by mouth daily as needed for moderate constipation.), Disp: 28 packet, Rfl: 0   polyethylene glycol (MIRALAX / GLYCOLAX) 17 g packet, Take 17 g by mouth 2 (two) times daily., Disp: 14 each, Rfl: 0   tiZANidine (ZANAFLEX) 2 MG tablet, Take 2 mg by mouth 2 (two) times daily., Disp: , Rfl:  Social History   Socioeconomic History   Marital status: Divorced    Spouse name: Not on file   Number of children: 2   Years of education: Not on file   Highest education level: Not on file  Occupational History   Not on file  Tobacco Use   Smoking status: Former    Packs/day: 4.00    Years: 3.00    Additional pack years: 0.00    Total pack years: 12.00    Types: Cigarettes    Start date: 06/10/1960    Quit date: 02/20/1964    Years since quitting: 58.3   Smokeless tobacco: Never  Vaping Use   Vaping Use: Never used  Substance and Sexual Activity   Alcohol use: No    Alcohol/week: 0.0 standard drinks of alcohol   Drug use: No   Sexual activity: Not Currently    Birth control/protection: None  Other Topics Concern   Not on file  Social History Narrative   ** Merged History Encounter **       ** Merged History Encounter **  Ms Carolin Coy lives alone. She is separated/divorced from her second husband and widowed by her first husband. She has twin daughters, Aggie Cosier and Jasmine December. Aggie Cosier has been a cause of stress for her and she believes was preven   ting her from getting medical care that she needed. She is no longer talking to [facility]. She states that Jasmine December is involved in her care and that she trusts her to have her best interest in mind.      Social Determinants of Health   Financial Resource Strain: Low Risk  (03/28/2021)   Overall Financial Resource Strain (CARDIA)     Difficulty of Paying Living Expenses: Not hard at all  Food Insecurity: No Food Insecurity (05/14/2022)   Hunger Vital Sign    Worried About Running Out of Food in the Last Year: Never true    Ran Out of Food in the Last Year: Never true  Transportation Needs: No Transportation Needs (05/14/2022)   PRAPARE - Administrator, Civil Service (Medical): No    Lack of Transportation (Non-Medical):  No  Physical Activity: Insufficiently Active (03/28/2021)   Exercise Vital Sign    Days of Exercise per Week: 5 days    Minutes of Exercise per Session: 20 min  Stress: No Stress Concern Present (03/28/2021)   [name] - Occupational Stress Questionnaire    Feeling of Stress : Not at all  Social Connections: Moderately Integrated (03/28/2021)   Social Connection and Isolation Panel [NHANES]    Frequency of Communication with Friends and Family: More than three times a week    Frequency of Social Gatherings with Friends and Family: More than three times a week    Attends Religious Services: 1 to 4 times per year    Active Member of [employer] or Organizations: Yes    Attends Banker Meetings: 1 to 4 times per year    Marital Status: Divorced  Catering manager Violence: Not At Risk (05/14/2022)   Humiliation, Afraid, Rape, and Kick questionnaire    Fear of Current or Ex-Partner: No    Emotionally Abused: No    Physically Abused: No    Sexually Abused: No   Family History  Problem Relation Age of Onset   Diabetes Mother    Heart disease Mother    Hypertension Mother    Gallbladder disease Mother    Heart disease Father    Pneumonia Sister    Breast cancer Paternal Grandmother    Hypertension Brother    Hyperlipidemia Brother    Hypertension Child    Gallbladder disease Other     Objective: Office vital signs reviewed. There were no vitals taken for this visit.  Physical Examination:  General: Awake, alert, *** nourished, No acute  distress HEENT: Normal    Neck: No masses palpated. No lymphadenopathy    Ears: Tympanic membranes intact, normal light reflex, no erythema, no bulging    Eyes: PERRLA, extraocular membranes intact, sclera ***    Nose: nasal turbinates moist, *** nasal discharge    Throat: moist mucus membranes, no erythema, *** tonsillar exudate.  Airway is patent Cardio: regular rate and rhythm, S1S2 heard, no murmurs appreciated Pulm: clear to auscultation bilaterally, no wheezes, rhonchi or rales; normal work of breathing on room air GI: soft, non-tender, non-distended, bowel sounds present x4, no hepatomegaly, no splenomegaly, no masses GU: external vaginal tissue ***, cervix ***, *** punctate lesions on cervix appreciated, *** discharge from cervical os, *** bleeding, *** cervical motion tenderness, *** abdominal/ adnexal masses Extremities: warm, well perfused, No edema, cyanosis or clubbing; +*** pulses bilaterally MSK: *** gait and *** station Skin: dry; intact; no rashes or lesions Neuro: *** Strength and light touch sensation grossly intact, *** DTRs ***/4  Assessment/ Plan: 82 y.o. female   ***  No orders of the defined types were placed in this encounter.  No orders of the defined types were placed in this encounter.    Raliegh Ip, DO [facility] [phone]

## 2022-06-25 LAB — CBC
Hematocrit: 33 % — ABNORMAL LOW (ref 34.0–46.6)
Hemoglobin: 10.7 g/dL — ABNORMAL LOW (ref 11.1–15.9)
MCH: 29.9 pg (ref 26.6–33.0)
MCHC: 32.4 g/dL (ref 31.5–35.7)
MCV: 92 fL (ref 79–97)
Platelets: 298 10*3/uL (ref 150–450)
RBC: 3.58 x10E6/uL — ABNORMAL LOW (ref 3.77–5.28)
RDW: 12.2 % (ref 11.7–15.4)
WBC: 9.3 10*3/uL (ref 3.4–10.8)

## 2022-07-01 ENCOUNTER — Telehealth: Payer: Self-pay | Admitting: Family Medicine

## 2022-07-01 NOTE — Telephone Encounter (Signed)
Contacted Kaitlyn Serrano to schedule their annual wellness visit. Appointment made for 07/11/2022.  Thank you,  Judeth Cornfield,  AMB Clinical Support [facility] AW[facility] Program Direct Dial ??[phone]

## 2022-07-11 ENCOUNTER — Ambulatory Visit (INDEPENDENT_AMBULATORY_CARE_PROVIDER_SITE_OTHER): Payer: Medicare PPO

## 2022-07-11 VITALS — Ht 62.0 in | Wt 108.0 lb

## 2022-07-11 DIAGNOSIS — Z0000 Encounter for general adult medical examination without abnormal findings: Secondary | ICD-10-CM

## 2022-07-11 NOTE — Progress Notes (Signed)
Subjective:   Kaitlyn Serrano is a 82 y.o. female who presents for Medicare Annual (Subsequent) preventive examination. I connected with  Kaitlyn Serrano on 07/11/22 by a audio enabled telemedicine application and verified that I am speaking with the correct person using two identifiers.  Patient Location: Home  Provider Location: Home Office  I discussed the limitations of evaluation and management by telemedicine. The patient expressed understanding and agreed to proceed.  Review of Systems     Cardiac Risk Factors include: advanced age (>64men, >6 women);dyslipidemia;hypertension     Objective:    Today's Vitals   07/11/22 1423  Weight: 108 lb (49 kg)  Height: 5\' 2"  (1.575 m)   Body mass index is 19.75 kg/m.     07/11/2022    2:26 PM 05/14/2022   12:11 PM 05/08/2022    1:24 PM 03/28/2021   11:48 AM 06/08/2019    5:00 PM 06/04/2019    1:31 PM 08/05/2018    9:21 AM  Advanced Directives  Does Patient Have a Medical Advance Directive? Yes Yes Yes No No No No  Type of Estate agent of [place];Living will Healthcare Power of [place];Living will Healthcare Power of Attorney      Does patient want to make changes to medical advance directive? No - Patient declined No - Guardian declined       Copy of Healthcare Power of Attorney in Chart? Yes - validated most recent copy scanned in chart (See row information) Yes - validated most recent copy scanned in chart (See row information)       Would patient like information on creating a medical advance directive?    No - Patient declined No - Patient declined  No - Patient declined    Current Medications (verified) Outpatient Encounter Medications as of 07/11/2022  Medication Sig   atenolol (TENORMIN) 25 MG tablet Take 1 tablet (25 mg total) by mouth daily.   busPIRone (BUSPAR) 10 MG tablet Take 2 tablets (20 mg total) by mouth 2 (two) times daily.   celecoxib (CELEBREX) 200 MG capsule TAKE ONE CAPSULE DAILY WITH  FOOD   cyclobenzaprine (FLEXERIL) 10 MG tablet Take 10 mg by mouth 3 (three) times daily as needed for muscle spasms.   diltiazem (TIADYLT ER) 120 MG 24 hr capsule TAKE 1 CAPSULE BY MOUTH EVERY DAY   docusate sodium (COLACE) 100 MG capsule Take 1 capsule (100 mg total) by mouth 2 (two) times daily.   donepezil (ARICEPT) 10 MG tablet Take 1 tablet (10 mg total) by mouth at bedtime. For dementia   ezetimibe (ZETIA) 10 MG tablet Take 1 tablet (10 mg total) by mouth daily.   FEMRING 0.05 MG/24HR RING INSERT VAGINALLY AS DIRECTED EVERY 3 MONTHS   fluticasone (FLONASE) 50 MCG/ACT nasal spray USE TWO SPRAYS IN EACH NOSTRIL ONCE DAILY (Patient taking differently: Place 1 spray into both nostrils daily as needed for allergies.)   hydrALAZINE (APRESOLINE) 25 MG tablet TAKE 1 TABLET BY MOUTH TWICE A DAY   HYDROcodone-acetaminophen (NORCO) 10-325 MG tablet Take 1 tablet by mouth every 4 (four) hours as needed for severe pain.   ipratropium (ATROVENT) 0.06 % nasal spray Place 1 spray into both nostrils 2 (two) times daily as needed for rhinitis.   lisinopril (ZESTRIL) 40 MG tablet Take 1 tablet (40 mg total) by mouth daily.   loratadine (CLARITIN) 10 MG tablet Take 1 tablet by mouth once daily   mirtazapine (REMERON) 15 MG tablet Take 1 tablet (15  mg total) by mouth at bedtime. For anxiety/ appetite/ sleep   pantoprazole (PROTONIX) 40 MG tablet TAKE 1 TABLET BY MOUTH ONCE DAILY AS NEEDED FOR ACID REFLUX (Patient taking differently: Take 40 mg by mouth daily.)   polyethylene glycol (MIRALAX / GLYCOLAX) 17 g packet Take 17 g by mouth 2 (two) times daily. (Patient taking differently: Take 17 g by mouth daily as needed for moderate constipation.)   polyethylene glycol (MIRALAX / GLYCOLAX) 17 g packet Take 17 g by mouth 2 (two) times daily.   tiZANidine (ZANAFLEX) 2 MG tablet Take 2 mg by mouth 2 (two) times daily.   No facility-administered encounter medications on file as of 07/11/2022.    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 family unaware   Dysphagia    Dyspnea    Generalized weakness    GERD (gastroesophageal reflux disease)    GERD (gastroesophageal reflux disease)    High cholesterol    Hoarseness    Hx of cardiovascular stress test    negative 2012 and 2016   Hypertension    Insomnia    Intervertebral disc disorder with myelopathy, lumbar region    Lumbago    Lumbar spine pain    PONV (postoperative nausea and vomiting)    confusion after anesthesia   Renal insufficiency    Ringing in ears    resolved   Spondylosis with myelopathy, lumbar region    legs, back   Past Surgical History:  Procedure Laterality Date   ABDOMINAL HYSTERECTOMY     BACK SURGERY     BONE MARROW ASPIRATION     CERVICAL SPINE SURGERY     EYE SURGERY Bilateral    Lasic   FIXATION KYPHOPLASTY     LAMINOTOMY     SPINAL FUSION     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   Procedure: TOTAL HIP ARTHROPLASTY ANTERIOR APPROACH;  Surgeon: Durene Romans, MD;  Location: [facility];  Service: Orthopedics;  Laterality: Right;  70 mins   TOTAL HIP ARTHROPLASTY Left 05/14/2022   Procedure: TOTAL HIP ARTHROPLASTY ANTERIOR APPROACH;  Surgeon: Durene Romans, MD;  Location: [facility];  Service: Orthopedics;  Laterality: Left;   Family History  Problem Relation Age of Onset   Diabetes Mother    Heart disease Mother    Hypertension Mother    Gallbladder disease Mother    Heart disease Father    Pneumonia Sister    Breast cancer Paternal Grandmother    Hypertension Brother    Hyperlipidemia Brother    Hypertension Child    Gallbladder disease Other    Social History   Socioeconomic History   Marital  status: Divorced    Spouse name: Not on file   Number of children: 2   Years of education: Not on file   Highest education level: Not on file  Occupational History   Not on file  Tobacco Use   Smoking status: Former    Packs/day: 4.00    Years: 3.00    Additional pack years: 0.00    Total pack years: 12.00    Types: Cigarettes    Start date: 06/10/1960    Quit date: 02/20/1964    Years since  quitting: 58.4   Smokeless tobacco: Never  Vaping Use   Vaping Use: Never used  Substance and Sexual Activity   Alcohol use: No    Alcohol/week: 0.0 standard drinks of alcohol   Drug use: No   Sexual activity: Not Currently    Birth control/protection: None  Other Topics Concern   Not on file  Social History Narrative   ** Merged History Encounter **       ** Merged History Encounter **  Ms Carolin Coy lives alone. She is separated/divorced from her second husband and widowed by her first husband. She has twin daughters, Aggie Cosier and Jasmine December. Aggie Cosier has been a cause of stress for her and she believes was preven   ting her from getting medical care that she needed. She is no longer talking to [facility]. She states that Jasmine December is involved in her care and that she trusts her to have her best interest in mind.      Social Determinants of Health   Financial Resource Strain: Low Risk  (07/11/2022)   Overall Financial Resource Strain (CARDIA)    Difficulty of Paying Living Expenses: Not hard at all  Food Insecurity: No Food Insecurity (07/11/2022)   Hunger Vital Sign    Worried About Running Out of Food in the Last Year: Never true    Ran Out of Food in the Last Year: Never true  Transportation Needs: No Transportation Needs (07/11/2022)   PRAPARE - Administrator, Civil Service (Medical): No    Lack of Transportation (Non-Medical): No  Physical Activity: Insufficiently Active (07/11/2022)   Exercise Vital Sign    Days of Exercise per Week: 3 days    Minutes of Exercise per Session: 30  min  Stress: No Stress Concern Present (07/11/2022)   [name] - Occupational Stress Questionnaire    Feeling of Stress : Not at all  Social Connections: Moderately Isolated (07/11/2022)   Social Connection and Isolation Panel [NHANES]    Frequency of Communication with Friends and Family: More than three times a week    Frequency of Social Gatherings with Friends and Family: More than three times a week    Attends Religious Services: More than 4 times per year    Active Member of [employer] or Organizations: No    Attends Banker Meetings: Never    Marital Status: Widowed    Tobacco Counseling Counseling given: Not Answered   Clinical Intake:  Pre-visit preparation completed: Yes  Pain : No/denies pain     Nutritional Risks: None Diabetes: No  How often do you need to have someone help you when you read instructions, pamphlets, or other written materials from your doctor or pharmacy?: 1 - Never  Diabetic?no   Interpreter Needed?: No  Information entered by :: Renie Ora, LPN   Activities of Daily Living    07/11/2022    2:26 PM 05/14/2022    6:14 PM  In your present state of health, do you have any difficulty performing the following activities:  Hearing? 0   Vision? 0   Difficulty concentrating or making decisions? 0   Walking or climbing stairs? 0   Dressing or bathing? 0   Doing errands, shopping? 0 1  Preparing Food and eating ? N   Using the Toilet? N   In the past six months, have you accidently leaked urine? N   Do you have problems with loss of bowel control? N   Managing your Medications?  N   Managing your Finances? N   Housekeeping or managing your Housekeeping? N     Patient Care Team: Raliegh Ip, DO as PCP - General (Family Medicine) Wyline Mood Dorothe Pea, MD as PCP - Cardiology (Cardiology) Verlin Fester, PA-C as Physician Assistant (Orthopedic Surgery) Durene Romans, MD as Consulting Physician  (Orthopedic Surgery) Adam Phenix, DPM as Consulting Physician (Podiatry)  Indicate any recent Medical Services you may have received from other than [facility] providers in the past year (date may be approximate).     Assessment:   This is a routine wellness examination for [place].  Hearing/Vision screen Vision Screening - Comments:: Wears rx glasses - up to date with routine eye exams with  Dr.Johnson   Dietary issues and exercise activities discussed: Current Exercise Habits: Home exercise routine, Type of exercise: walking, Time (Minutes): 30, Frequency (Times/Week): 3, Weekly Exercise (Minutes/Week): 90, Intensity: Mild, Exercise limited by: None identified   Goals Addressed             This Visit's Progress    DIET - INCREASE WATER INTAKE         Depression Screen    07/11/2022    2:25 PM 06/24/2022    3:01 PM 05/07/2022    2:34 PM 05/06/2022   11:01 AM 02/04/2022   10:12 AM 11/19/2021    3:35 PM 08/09/2021    3:20 PM  PHQ 2/9 Scores  PHQ - 2 Score 0 2 4 4  0 0 2  PHQ- 9 Score 0 11 15 16   14     Fall Risk    07/11/2022    2:24 PM 06/24/2022    2:53 PM 05/06/2022   11:01 AM 04/12/2022    1:16 PM 11/19/2021    3:35 PM  Fall Risk   Falls in the past year? 0 1 1 0 0  Number falls in past yr: 0 1 1 0   Injury with Fall? 0 1 1 0   Risk for fall due to : No Fall Risks History of fall(s) History of fall(s) No Fall Risks   Follow up Falls prevention discussed Education provided Education provided Education provided     FALL RISK PREVENTION PERTAINING TO THE HOME:  Any stairs in or around the home? No  If so, are there any without handrails? No  Home free of loose throw rugs in walkways, pet beds, electrical cords, etc? Yes  Adequate lighting in your home to reduce risk of falls? Yes   ASSISTIVE DEVICES UTILIZED TO PREVENT FALLS:  Life alert? No  Use of a cane, walker or w/c? No  Grab bars in the bathroom? Yes  Shower chair or bench in shower? Yes  Elevated toilet seat  or a handicapped toilet? Yes       08/09/2021    3:20 PM 12/17/2019    3:32 PM 08/20/2019    8:42 AM  MMSE - Mini Mental State Exam  Orientation to time 3 5 3   Orientation to Place 0 5 5  Registration 3 3 0  Attention/ Calculation 5 2 0  Recall 0 2 2  Language- name 2 objects 2 2 2   Language- repeat 1 1 0  Language- follow 3 step command 3 3 3   Language- read & follow direction 1 1 1   Write a sentence 1 1 0  Copy design 0 1 0  Total score 19 26 16         07/11/2022    2:28 PM 07/11/2022  2:27 PM 03/28/2021   12:10 PM 03/28/2021   11:53 AM  6CIT Screen  What Year? 0 points 0 points 0 points 0 points  What month? 0 points 0 points 0 points 0 points  What time? 3 points 0 points 0 points 0 points  Count back from 20 2 points 0 points 0 points 0 points  Months in reverse 0 points 0 points 4 points 4 points  Repeat phrase 4 points 0 points 2 points 2 points  Total Score 9 points 0 points 6 points 6 points    Immunizations Immunization History  Administered Date(s) Administered   COVID-19, mRNA, vaccine(Comirnaty)12 years and older 12/04/2021   Fluad Quad(high Dose 65+) 12/05/2020, 11/19/2021   Influenza Split 11/09/2012   Influenza, High Dose Seasonal PF 02/26/2012, 11/25/2013, 11/26/2014, 11/26/2015, 11/25/2016, 11/25/2017   Influenza,inj,Quad PF,6+ Mos 11/14/2015   Influenza-Unspecified 11/10/2013, 11/17/2014, 11/13/2017, 11/30/2018, 11/18/2020   Moderna Sars-Covid-2 Vaccination 04/27/2019, 05/25/2019, 01/25/2020   Pneumococcal Conjugate-13 11/10/2013   Pneumococcal Polysaccharide-23 02/25/2006, 08/28/2006, 10/26/2013, 12/02/2016   Td,absorbed, Preservative Free, Adult Use, Lf Unspecified 11/04/2006   Tdap 11/04/2006, 10/12/2010, 11/13/2017   Zoster Recombinat (Shingrix) 03/09/2021   Zoster, Live 10/27/2010    TDAP status: Up to date  Flu Vaccine status: Up to date  Pneumococcal vaccine status: Up to date  Covid-19 vaccine status: Completed vaccines  Qualifies  for Shingles Vaccine? Yes   Zostavax completed Yes   Shingrix Completed?: Yes  Screening Tests Health Maintenance  Topic Date Due   COVID-19 Vaccine (5 - 2023-24 season) 01/29/2022   Zoster Vaccines- Shingrix (2 of 2) 09/23/2022 (Originally 05/04/2021)   INFLUENZA VACCINE  09/26/2022   Medicare Annual Wellness (AWV)  07/11/2023   DTaP/Tdap/Td (4 - Td or Tdap) 11/14/2027   Pneumonia Vaccine 97+ Years old  Completed   DEXA SCAN  Completed   HPV VACCINES  Aged Out    Health Maintenance  Health Maintenance Due  Topic Date Due   COVID-19 Vaccine (5 - 2023-24 season) 01/29/2022    Colorectal cancer screening: No longer required.   Mammogram status: No longer required due to age .  Bone Density status: Completed 04/27/2021. Results reflect: Bone density results: OSTEOPENIA. Repeat every 5 years.  Lung Cancer Screening: (Low Dose CT Chest recommended if Age 7-80 years, 30 pack-year currently smoking OR have quit w/in 15years.) does not qualify.   Lung Cancer Screening Referral: n/a  Additional Screening:  Hepatitis C Screening: does not qualify;   Vision Screening: Recommended annual ophthalmology exams for early detection of glaucoma and other disorders of the eye. Is the patient up to date with their annual eye exam?  Yes  Who is the provider or what is the name of the office in which the patient attends annual eye exams? Dr.johnson  If pt is not established with a provider, would they like to be referred to a provider to establish care? No .   Dental Screening: Recommended annual dental exams for proper oral hygiene  Community Resource Referral / Chronic Care Management: CRR required this visit?  No   CCM required this visit?  No      Plan:     I have personally reviewed and noted the following in the patient's chart:   Medical and social history Use of alcohol, tobacco or illicit drugs  Current medications and supplements including opioid prescriptions. Patient  is not currently taking opioid prescriptions. Functional ability and status Nutritional status Physical activity Advanced directives List of other physicians Hospitalizations, surgeries, and  ER visits in previous 12 months Vitals Screenings to include cognitive, depression, and falls Referrals and appointments  In addition, I have reviewed and discussed with patient certain preventive protocols, quality metrics, and best practice recommendations. A written personalized care plan for preventive services as well as general preventive health recommendations were provided to patient.     Lorrene Reid, LPN   [id number]/61/0960   Nurse Notes: none

## 2022-07-11 NOTE — Patient Instructions (Signed)
[name] , Thank you for taking time to come for your Medicare Wellness Visit. I appreciate your ongoing commitment to your health goals. Please review the following plan we discussed and let me know if I can assist you in the future.   These are the goals we discussed:  Goals       "I would like to have my hip replacement scheduled" (pt-stated)      Current Barriers:  Chronic Disease Management support and education needs related to osteoarthritis and hip pain Family dynamic stress Potential cognitive/memory deficits  Nurse Case Manager Clinical Goal(s):  Over the next 30 days, patient will be in contact with orthopedic surgeon and will have hip replacement surgery rescheduled  Interventions:  Chart reviewed. Patient was scheduled for hip surgery with provider at [facility] in June 2020 but the surgery was cancelled by her daughter, Kaitlyn Serrano. Discussion of HPI with patient Has hip pain that is affecting her daily activities and ability to provide care for herself Taking "pain medicine" when she needs it to control pain States that she thought she had hip surgery and that her current pain was due to healing. She's since realized that Kaitlyn Serrano cancelled the surgery and that she also turned away and refused home health services. Discussed current referral to [facility] [phone] Consulted with referral coordinator and put Kaitlyn Serrano in contact with the coordinator Advised that home health nursing and PT services may be reordered after surgery Verified that patient has transportation to appointments   Patient Self Care Activities:  Takes care of her own ADLs and IADLs  Initial goal documentation       DIET - INCREASE WATER INTAKE      Exercise 3x per week (30 min per time)      Increase as tolerated.         This is a list of the screening recommended for you and due dates:  Health Maintenance  Topic Date Due   COVID-19 Vaccine (5 - 2023-24 season) 01/29/2022   Zoster  (Shingles) Vaccine (2 of 2) 09/23/2022*   Flu Shot  09/26/2022   Medicare Annual Wellness Visit  07/11/2023   DTaP/Tdap/Td vaccine (4 - Td or Tdap) 11/14/2027   Pneumonia Vaccine  Completed   DEXA scan (bone density measurement)  Completed   HPV Vaccine  Aged Out  *Topic was postponed. The date shown is not the original due date.    Advanced directives: In Chart   Conditions/risks identified: Aim for 30 minutes of exercise or brisk walking, 6-8 glasses of water, and 5 servings of fruits and vegetables each day.   Next appointment: Follow up in one year for your annual wellness visit    Preventive Care 65 Years and Older, Female Preventive care refers to lifestyle choices and visits with your health care provider that can promote health and wellness. What does preventive care include? A yearly physical exam. This is also called an annual well check. Dental exams once or twice a year. Routine eye exams. Ask your health care provider how often you should have your eyes checked. Personal lifestyle choices, including: Daily care of your teeth and gums. Regular physical activity. Eating a healthy diet. Avoiding tobacco and drug use. Limiting alcohol use. Practicing safe sex. Taking low-dose aspirin every day. Taking vitamin and mineral supplements as recommended by your health care provider. What happens during an annual well check? The services and screenings done by your health care provider during your annual well check will depend on  your age, overall health, lifestyle risk factors, and family history of disease. Counseling  Your health care provider may ask you questions about your: Alcohol use. Tobacco use. Drug use. Emotional well-being. Home and relationship well-being. Sexual activity. Eating habits. History of falls. Memory and ability to understand (cognition). Work and work Astronomer. Reproductive health. Screening  You may have the following tests or  measurements: Height, weight, and BMI. Blood pressure. Lipid and cholesterol levels. These may be checked every 5 years, or more frequently if you are over 51 years old. Skin check. Lung cancer screening. You may have this screening every year starting at age 61 if you have a 30-pack-year history of smoking and currently smoke or have quit within the past 15 years. Fecal occult blood test (FOBT) of the stool. You may have this test every year starting at age 77. Flexible sigmoidoscopy or colonoscopy. You may have a sigmoidoscopy every 5 years or a colonoscopy every 10 years starting at age 78. Hepatitis C blood test. Hepatitis B blood test. Sexually transmitted disease (STD) testing. Diabetes screening. This is done by checking your blood sugar (glucose) after you have not eaten for a while (fasting). You may have this done every 1-3 years. Bone density scan. This is done to screen for osteoporosis. You may have this done starting at age 80. Mammogram. This may be done every 1-2 years. Talk to your health care provider about how often you should have regular mammograms. Talk with your health care provider about your test results, treatment options, and if necessary, the need for more tests. Vaccines  Your health care provider may recommend certain vaccines, such as: Influenza vaccine. This is recommended every year. Tetanus, diphtheria, and acellular pertussis (Tdap, Td) vaccine. You may need a Td booster every 10 years. Zoster vaccine. You may need this after age 70. Pneumococcal 13-valent conjugate (PCV13) vaccine. One dose is recommended after age 69. Pneumococcal polysaccharide (PPSV23) vaccine. One dose is recommended after age 61. Talk to your health care provider about which screenings and vaccines you need and how often you need them. This information is not intended to replace advice given to you by your health care provider. Make sure you discuss any questions you have with your  health care provider. Document Released: 03/10/2015 Document Revised: 11/01/2015 Document Reviewed: 12/13/2014 Elsevier Interactive Patient Education  2017 [employer].  Fall Prevention in the Home Falls can cause injuries. They can happen to people of all ages. There are many things you can do to make your home safe and to help prevent falls. What can I do on the outside of my home? Regularly fix the edges of walkways and driveways and fix any cracks. Remove anything that might make you trip as you walk through a door, such as a raised step or threshold. Trim any bushes or trees on the path to your home. Use bright outdoor lighting. Clear any walking paths of anything that might make someone trip, such as rocks or tools. Regularly check to see if handrails are loose or broken. Make sure that both sides of any steps have handrails. Any raised decks and porches should have guardrails on the edges. Have any leaves, snow, or ice cleared regularly. Use sand or salt on walking paths during winter. Clean up any spills in your garage right away. This includes oil or grease spills. What can I do in the bathroom? Use night lights. Install grab bars by the toilet and in the tub and shower. Do not  use towel bars as grab bars. Use non-skid mats or decals in the tub or shower. If you need to sit down in the shower, use a plastic, non-slip stool. Keep the floor dry. Clean up any water that spills on the floor as soon as it happens. Remove soap buildup in the tub or shower regularly. Attach bath mats securely with double-sided non-slip rug tape. Do not have throw rugs and other things on the floor that can make you trip. What can I do in the bedroom? Use night lights. Make sure that you have a light by your bed that is easy to reach. Do not use any sheets or blankets that are too big for your bed. They should not hang down onto the floor. Have a firm chair that has side arms. You can use this for  support while you get dressed. Do not have throw rugs and other things on the floor that can make you trip. What can I do in the kitchen? Clean up any spills right away. Avoid walking on wet floors. Keep items that you use a lot in easy-to-reach places. If you need to reach something above you, use a strong step stool that has a grab bar. Keep electrical cords out of the way. Do not use floor polish or wax that makes floors slippery. If you must use wax, use non-skid floor wax. Do not have throw rugs and other things on the floor that can make you trip. What can I do with my stairs? Do not leave any items on the stairs. Make sure that there are handrails on both sides of the stairs and use them. Fix handrails that are broken or loose. Make sure that handrails are as long as the stairways. Check any carpeting to make sure that it is firmly attached to the stairs. Fix any carpet that is loose or worn. Avoid having throw rugs at the top or bottom of the stairs. If you do have throw rugs, attach them to the floor with carpet tape. Make sure that you have a light switch at the top of the stairs and the bottom of the stairs. If you do not have them, ask someone to add them for you. What else can I do to help prevent falls? Wear shoes that: Do not have high heels. Have rubber bottoms. Are comfortable and fit you well. Are closed at the toe. Do not wear sandals. If you use a stepladder: Make sure that it is fully opened. Do not climb a closed stepladder. Make sure that both sides of the stepladder are locked into place. Ask someone to hold it for you, if possible. Clearly mark and make sure that you can see: Any grab bars or handrails. First and last steps. Where the edge of each step is. Use tools that help you move around (mobility aids) if they are needed. These include: Canes. Walkers. Scooters. Crutches. Turn on the lights when you go into a dark area. Replace any light bulbs as soon  as they burn out. Set up your furniture so you have a clear path. Avoid moving your furniture around. If any of your floors are uneven, fix them. If there are any pets around you, be aware of where they are. Review your medicines with your doctor. Some medicines can make you feel dizzy. This can increase your chance of falling. Ask your doctor what other things that you can do to help prevent falls. This information is not intended to replace advice  given to you by your health care provider. Make sure you discuss any questions you have with your health care provider. Document Released: 12/08/2008 Document Revised: 07/20/2015 Document Reviewed: 03/18/2014 Elsevier Interactive Patient Education  2017 [employer].

## 2022-07-15 DIAGNOSIS — N3000 Acute cystitis without hematuria: Secondary | ICD-10-CM | POA: Diagnosis not present

## 2022-07-15 DIAGNOSIS — R300 Dysuria: Secondary | ICD-10-CM | POA: Diagnosis not present

## 2022-07-15 NOTE — Telephone Encounter (Signed)
Erroneous encounter will close.

## 2022-07-16 ENCOUNTER — Telehealth: Payer: Self-pay | Admitting: Family Medicine

## 2022-07-16 NOTE — Telephone Encounter (Signed)
She can have whoever she wants but medicare will not pay for CNA.  Medicaid will if she qualifies for that.

## 2022-07-16 NOTE — Telephone Encounter (Signed)
Daughter wants to know what patients options are.

## 2022-07-16 NOTE — Telephone Encounter (Signed)
Either applying and qualifying for Medicaid so she can get personal care services through [facility] or hiring someone privately (meaning she will have to pay out of pocket for those services since insurance does not cover).  If her daughter is her power of attorney, she can consider placing her in a dementia care facility.

## 2022-07-17 ENCOUNTER — Other Ambulatory Visit: Payer: Self-pay | Admitting: Nurse Practitioner

## 2022-07-17 NOTE — Telephone Encounter (Signed)
Pts daughter called back. Reviewed PCPs recommendations with her and daughter voiced understanding.

## 2022-07-19 DIAGNOSIS — M791 Myalgia, unspecified site: Secondary | ICD-10-CM | POA: Diagnosis not present

## 2022-07-19 DIAGNOSIS — M5106 Intervertebral disc disorders with myelopathy, lumbar region: Secondary | ICD-10-CM | POA: Diagnosis not present

## 2022-07-19 DIAGNOSIS — G894 Chronic pain syndrome: Secondary | ICD-10-CM | POA: Diagnosis not present

## 2022-08-07 ENCOUNTER — Ambulatory Visit: Payer: Medicare HMO | Admitting: Neurology

## 2022-09-10 ENCOUNTER — Telehealth: Payer: Self-pay | Admitting: Family Medicine

## 2022-09-11 NOTE — Telephone Encounter (Signed)
No recent medications sent- has refills at [facility]

## 2022-09-18 ENCOUNTER — Ambulatory Visit: Payer: Medicare HMO | Admitting: Family Medicine

## 2022-09-18 DIAGNOSIS — G894 Chronic pain syndrome: Secondary | ICD-10-CM | POA: Diagnosis not present

## 2022-09-18 DIAGNOSIS — M5106 Intervertebral disc disorders with myelopathy, lumbar region: Secondary | ICD-10-CM | POA: Diagnosis not present

## 2022-09-18 DIAGNOSIS — Z6823 Body mass index (BMI) 23.0-23.9, adult: Secondary | ICD-10-CM | POA: Diagnosis not present

## 2022-09-19 ENCOUNTER — Encounter: Payer: Self-pay | Admitting: Nurse Practitioner

## 2022-09-19 ENCOUNTER — Ambulatory Visit (INDEPENDENT_AMBULATORY_CARE_PROVIDER_SITE_OTHER): Payer: Medicare HMO | Admitting: Nurse Practitioner

## 2022-09-19 ENCOUNTER — Encounter: Payer: Self-pay | Admitting: Family Medicine

## 2022-09-19 ENCOUNTER — Ambulatory Visit (INDEPENDENT_AMBULATORY_CARE_PROVIDER_SITE_OTHER): Payer: Medicare HMO

## 2022-09-19 ENCOUNTER — Ambulatory Visit (HOSPITAL_COMMUNITY)
Admission: RE | Admit: 2022-09-19 | Discharge: 2022-09-19 | Disposition: A | Discharge status: Home / Self Care | Payer: Medicare HMO | Source: Ambulatory Visit | Attending: Nurse Practitioner | Admitting: Nurse Practitioner

## 2022-09-19 VITALS — BP 144/77 | HR 73 | Temp 99.2°F | Ht 62.0 in | Wt 105.0 lb

## 2022-09-19 DIAGNOSIS — R519 Headache, unspecified: Secondary | ICD-10-CM | POA: Insufficient documentation

## 2022-09-19 DIAGNOSIS — M1712 Unilateral primary osteoarthritis, left knee: Secondary | ICD-10-CM | POA: Diagnosis not present

## 2022-09-19 DIAGNOSIS — W19XXXA Unspecified fall, initial encounter: Secondary | ICD-10-CM | POA: Diagnosis not present

## 2022-09-19 DIAGNOSIS — S0990XA Unspecified injury of head, initial encounter: Secondary | ICD-10-CM | POA: Diagnosis not present

## 2022-09-19 DIAGNOSIS — S8992XA Unspecified injury of left lower leg, initial encounter: Secondary | ICD-10-CM | POA: Diagnosis not present

## 2022-09-19 DIAGNOSIS — M25562 Pain in left knee: Secondary | ICD-10-CM

## 2022-09-19 DIAGNOSIS — G238 Other specified degenerative diseases of basal ganglia: Secondary | ICD-10-CM | POA: Diagnosis not present

## 2022-09-19 DIAGNOSIS — S0003XA Contusion of scalp, initial encounter: Secondary | ICD-10-CM | POA: Diagnosis not present

## 2022-09-19 NOTE — Progress Notes (Signed)
Established Patient Office Visit  Subjective   Patient ID: Kaitlyn Serrano, female    DOB: [birth year]  Age: 82 y.o. MRN: [id number]  Chief Complaint  Patient presents with   Genia Hotter 4 days ago hurt knee, hit head. States she did not lose consciousness     Fall Associated symptoms include headaches. Pertinent negatives include no fever, loss of consciousness, nausea, tingling or vomiting.   Kaitlyn Serrano is a 82 y.o. female who sustained a bilateral knee injury 4 day(s) ago. Mechanism of injury: fall. Immediate symptoms: immediate pain, immediate swelling, no deformity was noted by the patient. Symptoms have been acute since that time. Prior history of related problems: no prior problems with this area in the past. Describes the pain as pressure like 8/10. Nothing make sit better. Walking bending make is wort. She 3 abrasions on the left and one on the right knee  HA started yesterday and she reports 8/10 dull like pain denies N/V. Denies LOC. She tripped over the dog and hit her head on a concrete floor. CT w/o contrast to r/o possible bleeding.   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   Right hip pain 11/03/2018   Overweight (BMI 25.0-29.9) 07/10/2018   High risk medication use 12/30/2017   Prediabetes 02/12/2017   Major depression 12/02/2016   Constipation 01/16/2015   Insomnia 01/16/2015   GAD (generalized anxiety disorder) 10/08/2013   Vitamin D deficiency 10/08/2013   GERD (gastroesophageal reflux disease) 10/08/2013   Hypertension 08/13/2012   Hyperlipemia 08/13/2012   Seasonal allergic rhinitis 06/30/2012   Hemorrhoids 06/25/2011   Past  Medical History:  Diagnosis Date   Anxiety    Benzodiazepine dependence (HCC) 12/30/2017   Chronic pain syndrome    Complication of anesthesia    Depression    Diverticulosis    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 family unaware   Dysphagia    Dyspnea    Generalized weakness    GERD (gastroesophageal reflux disease)    GERD (gastroesophageal reflux disease)    High cholesterol    Hoarseness    Hx of cardiovascular stress test    negative 2012 and 2016   Hypertension    Insomnia    Intervertebral disc disorder with myelopathy, lumbar region    Lumbago    Lumbar spine pain    PONV (postoperative nausea and vomiting)    confusion after anesthesia   Renal insufficiency    Ringing in ears    resolved   Spondylosis with myelopathy, lumbar region    legs, back   Past Surgical History:  Procedure Laterality Date   ABDOMINAL HYSTERECTOMY     BACK SURGERY     BONE MARROW ASPIRATION     CERVICAL SPINE SURGERY     EYE SURGERY Bilateral    Lasic   FIXATION KYPHOPLASTY     LAMINOTOMY     SPINAL FUSION     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   Procedure: TOTAL HIP ARTHROPLASTY ANTERIOR APPROACH;  Surgeon: Durene Romans, MD;  Location: [facility];  Service: Orthopedics;  Laterality: Right;  70 mins   TOTAL HIP ARTHROPLASTY Left  05/14/2022   Procedure: TOTAL HIP ARTHROPLASTY ANTERIOR APPROACH;  Surgeon: Durene Romans, MD;  Location: [facility];  Service: Orthopedics;  Laterality: Left;   Social History   Tobacco Use   Smoking status: Former    Current packs/day: 0.00    Average packs/day: 4.0 packs/day for 3.7 years (14.8 ttl pk-yrs)    Types: Cigarettes    Start date: 06/10/1960    Quit date: 02/20/1964    Years since quitting: 58.6   Smokeless tobacco: Never  Vaping Use   Vaping status: Never Used  Substance Use Topics   Alcohol use: No    Alcohol/week: 0.0 standard drinks of alcohol   Drug use: No   Social History   Socioeconomic History   Marital status: Divorced     Spouse name: Not on file   Number of children: 2   Years of education: Not on file   Highest education level: Not on file  Occupational History   Not on file  Tobacco Use   Smoking status: Former    Current packs/day: 0.00    Average packs/day: 4.0 packs/day for 3.7 years (14.8 ttl pk-yrs)    Types: Cigarettes    Start date: 06/10/1960    Quit date: 02/20/1964    Years since quitting: 58.6   Smokeless tobacco: Never  Vaping Use   Vaping status: Never Used  Substance and Sexual Activity   Alcohol use: No    Alcohol/week: 0.0 standard drinks of alcohol   Drug use: No   Sexual activity: Not Currently    Birth control/protection: None  Other Topics Concern   Not on file  Social History Narrative   ** Merged History Encounter **       ** Merged History Encounter **  [name] lives alone. She is separated/divorced from her second husband and widowed by her first husband. She has twin daughters, Kaitlyn Serrano and Kaitlyn Serrano. Kaitlyn Serrano has been a cause of stress for her and she believes was preven   ting her from getting medical care that she needed. She is no longer talking to [facility]. She states that Kaitlyn Serrano is involved in her care and that she trusts her to have her best interest in mind.      Social Determinants of Health   Financial Resource Strain: Low Risk  (07/11/2022)   Overall Financial Resource Strain (CARDIA)    Difficulty of Paying Living Expenses: Not hard at all  Food Insecurity: No Food Insecurity (07/11/2022)   Hunger Vital Sign    Worried About Running Out of Food in the Last Year: Never true    Ran Out of Food in the Last Year: Never true  Transportation Needs: No Transportation Needs (07/11/2022)   PRAPARE - Administrator, Civil Service (Medical): No    Lack of Transportation (Non-Medical): No  Physical Activity: Insufficiently Active (07/11/2022)   Exercise Vital Sign    Days of Exercise per Week: 3 days    Minutes of Exercise per Session: 30 min  Stress: No  Stress Concern Present (07/11/2022)   [name] - Occupational Stress Questionnaire    Feeling of Stress : Not at all  Social Connections: Moderately Isolated (07/11/2022)   Social Connection and Isolation Panel [NHANES]    Frequency of Communication with Friends and Family: More than three times a week    Frequency of Social Gatherings with Friends and Family: More than three times a week    Attends Religious Services: More than 4  times per year    Active Member of Clubs or Organizations: No    Attends Banker Meetings: Never    Marital Status: Widowed  Intimate Partner Violence: Not At Risk (07/11/2022)   Humiliation, Afraid, Rape, and Kick questionnaire    Fear of Current or Ex-Partner: No    Emotionally Abused: No    Physically Abused: No    Sexually Abused: No   Family Status  Relation Name Status   Mother  Deceased at age 53   Father  Deceased   Sister  Deceased   Daughter  Alive   PGM  (Not Specified)   Brother  Deceased   Child  (Not Specified)   Other  (Not Specified)  No partnership data on file   Family History  Problem Relation Age of Onset   Diabetes Mother    Heart disease Mother    Hypertension Mother    Gallbladder disease Mother    Heart disease Father    Pneumonia Sister    Breast cancer Paternal Grandmother    Hypertension Brother    Hyperlipidemia Brother    Hypertension Child    Gallbladder disease Other    Allergies  Allergen Reactions   Bee Venom Anaphylaxis   Hydrocodone Swelling   Penicillins Anaphylaxis and Rash    Has patient had a PCN reaction causing immediate rash, facial/tongue/throat swelling, SOB or lightheadedness with hypotension: Yes Has patient had a PCN reaction causing severe rash involving mucus membranes or skin necrosis: Yes Has patient had a PCN reaction that required hospitalization: Yes Has patient had a PCN reaction occurring within the last 10 years: No If all of the above  answers are "NO", then may proceed with Cephalosporin use.    Amlodipine Swelling   Elavil [Amitriptyline Hcl] Other (See Comments)    Confusion, hallucinations   Statins Swelling and Other (See Comments)    Peeling of skin      Review of Systems  Constitutional:  Negative for chills and fever.  Eyes:  Negative for blurred vision, double vision and pain.  Respiratory:  Negative for shortness of breath.   Cardiovascular:  Negative for chest pain and leg swelling.  Gastrointestinal:  Negative for nausea and vomiting.  Musculoskeletal:  Positive for joint pain.       Bilateral knees  Skin:  Negative for itching and rash.  Neurological:  Positive for headaches. Negative for dizziness, tingling, loss of consciousness and weakness.   Negative unless indicated in HPI   Objective:     BP (!) 144/77   Pulse 73   Temp 99.2 F (37.3 C) (Temporal)   Ht 5\' 2"  (1.575 m)   Wt 105 lb (47.6 kg)   SpO2 96%   BMI 19.20 kg/m  BP Readings from Last 3 Encounters:  09/19/22 (!) 144/77  06/24/22 (!) 134/53  05/15/22 (!) 155/71   Wt Readings from Last 3 Encounters:  09/19/22 105 lb (47.6 kg)  07/11/22 108 lb (49 kg)  06/24/22 108 lb (49 kg)      Physical Exam Vitals and nursing note reviewed.  Constitutional:      Appearance: Normal appearance.  HENT:     Head: Normocephalic.     Comments: Abrasion on forehead from fall Eyes:     General: Lids are normal. Lids are everted, no foreign bodies appreciated. Vision grossly intact. Gaze aligned appropriately. No scleral icterus.    Extraocular Movements: Extraocular movements intact.     Right eye: Normal extraocular motion  and no nystagmus.     Left eye: Normal extraocular motion and no nystagmus.     Conjunctiva/sclera: Conjunctivae normal.     Pupils: Pupils are equal, round, and reactive to light.  Cardiovascular:     Rate and Rhythm: Normal rate and regular rhythm.  Pulmonary:     Effort: Pulmonary effort is normal.     Breath  sounds: Normal breath sounds.  Musculoskeletal:     Cervical back: Normal range of motion and neck supple. No rigidity or tenderness.     Right knee: Swelling present. Tenderness present.     Instability Tests: Anterior drawer test negative.     Left knee: Swelling present. Tenderness present.     Instability Tests: Anterior drawer test negative.     Comments: Multiple abrasion on bilateral knee that is healing properly  Skin:    General: Skin is warm and dry.     Coloration: Skin is not jaundiced.     Findings: No rash.  Neurological:     General: No focal deficit present.     Mental Status: She is alert and oriented to person, place, and time. Mental status is at baseline.     Gait: Gait is intact.    X-ray: ordered, but results not yet available, soft tissue swelling, pending review by radiologist.  STAT CT scan ordered  No results found for any visits on 09/19/22.  Last CBC Lab Results  Component Value Date   WBC 9.3 06/24/2022   HGB 10.7 (L) 06/24/2022   HCT 33.0 (L) 06/24/2022   MCV 92 06/24/2022   MCH 29.9 06/24/2022   RDW 12.2 06/24/2022   PLT 298 06/24/2022   Last metabolic panel Lab Results  Component Value Date   GLUCOSE 175 (H) 05/15/2022   NA 134 (L) 05/15/2022   K 3.7 05/15/2022   CL 100 05/15/2022   CO2 25 05/15/2022   BUN 10 05/15/2022   CREATININE 0.64 05/15/2022   GFRNONAA >60 05/15/2022   CALCIUM 8.8 (L) 05/15/2022   PROT 6.6 04/12/2022   ALBUMIN 4.0 04/12/2022   LABGLOB 2.6 04/12/2022   AGRATIO 1.5 04/12/2022   BILITOT 0.3 04/12/2022   ALKPHOS 96 04/12/2022   AST 15 04/12/2022   ALT 5 04/12/2022   ANIONGAP 9 05/15/2022   Last lipids Lab Results  Component Value Date   CHOL 200 (H) 04/19/2022   HDL 59 04/19/2022   LDLCALC 110 (H) 04/19/2022   TRIG 179 (H) 04/19/2022   CHOLHDL 3.4 04/19/2022        Assessment & Plan:  Fall, initial encounter -     DG Knee 1-2 Views Left -     CT HEAD WO CONTRAST ( ); Future  Acute pain of  left knee -     DG Knee 1-2 Views Left  Acute head injury, initial encounter -     CT HEAD WO CONTRAST ( ); Future  Acute nonintractable headache, unspecified headache type -     CT HEAD WO CONTRAST ( ); Future  ASSESSMENT: Kaitlyn Serrano is a 82 yrs old female seen today post fall for HA and knee pain Knee sprain  PLAN: rest the injured area as much as practical, X-Ray ordered Continue  Celebrex that was already prescribed - awaiting final x-ray report HA: Stat CT scan order, She instructed to go [facility] for  Will clal her when x-ray and CT results are back See orders for this visit as documented in the electronic medical record.   Return in  about 1 month (around 10/20/2022) for follow-up with PCP.    Arrie Aran Santa Lighter, DNP [facility] [address] [place], [place] [address] [phone]

## 2022-10-02 ENCOUNTER — Encounter: Payer: Self-pay | Admitting: Nurse Practitioner

## 2022-10-02 ENCOUNTER — Ambulatory Visit (INDEPENDENT_AMBULATORY_CARE_PROVIDER_SITE_OTHER): Payer: Medicare HMO | Admitting: Nurse Practitioner

## 2022-10-02 VITALS — BP 173/84 | HR 74 | Temp 97.5°F | Resp 20 | Ht 62.0 in | Wt 105.0 lb

## 2022-10-02 DIAGNOSIS — K5901 Slow transit constipation: Secondary | ICD-10-CM

## 2022-10-02 DIAGNOSIS — K921 Melena: Secondary | ICD-10-CM | POA: Diagnosis not present

## 2022-10-02 LAB — HEMOGLOBIN, FINGERSTICK: Hemoglobin: 10.8 g/dL — ABNORMAL LOW (ref 11.1–15.9)

## 2022-10-02 NOTE — Patient Instructions (Signed)

## 2022-10-02 NOTE — Progress Notes (Addendum)
Subjective:    Patient ID: Kaitlyn Serrano, female    DOB: Aug 25, 1940, 82 y.o.   MRN: [id number]   Chief Complaint: rectal bleeding  HPI  3 days ago patient said she had a bowel movement and there was bright red blood in it. She has not had a bowel movement since then. No blood in panties. She has a history of constipation and use to be on miralax. 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  Eyes:  Negative for pain.  Respiratory:  Negative for shortness of breath.   Cardiovascular:  Negative for chest pain, palpitations and leg swelling.  Gastrointestinal:  Negative for abdominal pain.  Endocrine: Negative for polydipsia.  Skin:  Negative for rash.  Neurological:  Negative for dizziness, weakness and headaches.  Hematological:  Does not bruise/bleed easily.  All other systems reviewed and are negative.      Objective:   Physical Exam Constitutional:      Appearance: Normal appearance.  Cardiovascular:     Rate and Rhythm: Normal rate and regular rhythm.     Heart sounds: Normal heart sounds.  Pulmonary:     Effort: Pulmonary  effort is normal.     Breath sounds: Normal breath sounds.  Abdominal:     General: Abdomen is flat. Bowel sounds are normal.     Palpations: Abdomen is soft.     Tenderness: There is no abdominal tenderness.  Neurological:     General: No focal deficit present.     Mental Status: She is alert and oriented to person, place, and time.  Psychiatric:        Mood and Affect: Mood normal.        Behavior: Behavior normal.    BP (!) 173/84   Pulse 74   Temp (!) 97.5 F (36.4 C) (Temporal)   Resp 20   Ht 5\' 2"  (1.575 m)   Wt 105 lb (47.6 kg)   SpO2 97%   BMI 19.20 kg/m   Hgb 10.8      Assessment & Plan:   Jettie Booze in today with chief complaint of Rectal Bleeding (3 days since. No bowel movements since/)   1. Blood in stool Hgb today  2. Slow transit constipation Miralax daily in apple juice Increase fiber in diet    The above assessment and management plan was discussed with the patient. The patient verbalized understanding of and has agreed to the management plan. Patient is aware to call the clinic if symptoms persist or worsen. Patient is aware when to return to the clinic for a follow-up visit. Patient educated on when it is appropriate to  go to the emergency department.   Mary-Margaret Daphine Deutscher, FNP

## 2022-10-16 ENCOUNTER — Ambulatory Visit: Payer: Medicare HMO | Admitting: Cardiology

## 2022-10-16 NOTE — Progress Notes (Deleted)
Clinical Summary [name] is a 82 y.o.female  1. HTN - recent ER visit 09/2018 with high bp's. SBPs to 180s and chest pain. Chest pain resolved with bp control, discharged from ER. Trops neg. No recurrence of chest pain since that ER visit.    recent issues with high bp, we increased her lisionpril to 20 mg daily   - bps are running 130s/80s, taking lisinopril 20mg  daily.      2. Palpitations - seen in ER 11/2015 with palpitations. Telemetry monitor throughtout visit showed NSR per ER notes. 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 family unaware   Dysphagia    Dyspnea    Generalized weakness    GERD (gastroesophageal reflux disease)    GERD (gastroesophageal reflux disease)    High cholesterol    Hoarseness    Hx of cardiovascular stress test    negative 2012 and 2016   Hypertension    Insomnia    Intervertebral disc disorder with myelopathy, lumbar region    Lumbago    Lumbar spine pain    PONV (postoperative nausea and vomiting)    confusion after anesthesia   Renal insufficiency    Ringing in ears    resolved   Spondylosis with myelopathy, lumbar region    legs, back     Allergies  Allergen Reactions   Bee Venom Anaphylaxis   Hydrocodone Swelling   Penicillins Anaphylaxis and Rash    Has patient had a PCN reaction causing immediate rash, facial/tongue/throat swelling, SOB or lightheadedness with hypotension: Yes Has patient had a PCN reaction causing severe rash involving mucus membranes or skin necrosis: Yes Has patient had a PCN reaction that required hospitalization: Yes Has patient had a PCN reaction occurring within the last 10 years: No If all of  the above answers are "NO", then may proceed with Cephalosporin use.    Amlodipine Swelling   Elavil [Amitriptyline Hcl] Other (See Comments)    Confusion, hallucinations   Statins Swelling and Other (See Comments)    Peeling of skin     Current Outpatient Medications  Medication Sig Dispense Refill   atenolol (TENORMIN) 25 MG tablet Take 1 tablet (25 mg total) by mouth daily. 90 tablet 3   busPIRone (BUSPAR) 10 MG tablet Take 2 tablets (20 mg total) by mouth 2 (two) times daily. 360 tablet 3   celecoxib (CELEBREX) 200 MG capsule TAKE ONE CAPSULE DAILY WITH FOOD 30 capsule 2   cyclobenzaprine (FLEXERIL) 10 MG tablet Take 10 mg by mouth 3 (three) times daily as needed for muscle spasms.     diltiazem (TIADYLT ER) 120 MG 24 hr capsule TAKE 1 CAPSULE BY MOUTH EVERY DAY 90 capsule 3   docusate sodium (COLACE) 100 MG capsule Take 1 capsule (100 mg total) by mouth 2 (two) times daily. 28 capsule 0   donepezil (ARICEPT) 10 MG tablet Take 1 tablet (10 mg total) by mouth at bedtime. For dementia 90 tablet 3   ezetimibe (ZETIA) 10 MG tablet TAKE ONE TABLET DAILY 90 tablet 1   FEMRING 0.05 MG/24HR RING INSERT VAGINALLY AS DIRECTED EVERY 3 MONTHS 1 each 2  hydrALAZINE (APRESOLINE) 25 MG tablet TAKE 1 TABLET BY MOUTH TWICE A DAY 60 tablet 0   HYDROcodone-acetaminophen (NORCO) 10-325 MG tablet Take 1 tablet by mouth every 4 (four) hours as needed for severe pain.     ipratropium (ATROVENT) 0.06 % nasal spray Place 1 spray into both nostrils 2 (two) times daily as needed for rhinitis.     lisinopril (ZESTRIL) 40 MG tablet Take 1 tablet (40 mg total) by mouth daily. 90 tablet 3   loratadine (CLARITIN) 10 MG tablet Take 1 tablet by mouth once daily 90 tablet 0   mirtazapine (REMERON) 15 MG tablet Take 1 tablet (15 mg total) by mouth at bedtime. For anxiety/ appetite/ sleep 90 tablet 3   pantoprazole (PROTONIX) 40 MG tablet TAKE 1 TABLET BY MOUTH ONCE DAILY AS NEEDED FOR ACID REFLUX (Patient taking  differently: Take 40 mg by mouth daily.) 90 tablet 3   polyethylene glycol (MIRALAX / GLYCOLAX) 17 g packet Take 17 g by mouth 2 (two) times daily. 14 each 0   tiZANidine (ZANAFLEX) 2 MG tablet Take 2 mg by mouth 2 (two) times daily.     No current facility-administered medications for this visit.     Past Surgical History:  Procedure Laterality Date   ABDOMINAL HYSTERECTOMY     BACK SURGERY     BONE MARROW ASPIRATION     CERVICAL SPINE SURGERY     EYE SURGERY Bilateral    Lasic   FIXATION KYPHOPLASTY     LAMINOTOMY     SPINAL FUSION     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   Procedure: TOTAL HIP ARTHROPLASTY ANTERIOR APPROACH;  Surgeon: Durene Romans, MD;  Location: [facility];  Service: Orthopedics;  Laterality: Right;  70 mins   TOTAL HIP ARTHROPLASTY Left 05/14/2022   Procedure: TOTAL HIP ARTHROPLASTY ANTERIOR APPROACH;  Surgeon: Durene Romans, MD;  Location: [facility];  Service: Orthopedics;  Laterality: Left;     Allergies  Allergen Reactions   Bee Venom Anaphylaxis   Hydrocodone Swelling   Penicillins Anaphylaxis and Rash    Has patient had a PCN reaction causing immediate rash, facial/tongue/throat swelling, SOB or lightheadedness with hypotension: Yes Has patient had a PCN reaction causing severe rash involving mucus membranes or skin necrosis: Yes Has patient had a PCN reaction that required hospitalization: Yes Has patient had a PCN reaction occurring within the last 10 years: No If all of the above answers are "NO", then may proceed with Cephalosporin use.    Amlodipine Swelling   Elavil [Amitriptyline Hcl] Other (See Comments)    Confusion, hallucinations   Statins Swelling and Other (See Comments)    Peeling of skin      Family History  Problem Relation Age of Onset   Diabetes Mother    Heart disease Mother    Hypertension Mother    Gallbladder disease Mother    Heart disease Father    Pneumonia Sister    Breast cancer Paternal Grandmother     Hypertension Brother    Hyperlipidemia Brother    Hypertension Child    Gallbladder disease Other      Social History [name] reports that she quit smoking about 58 years ago. Her smoking use included cigarettes. She started smoking about 62 years ago. She has a 14.8 pack-year smoking history. She has never used smokeless tobacco. [name] reports no history of alcohol use.   Review of Systems CONSTITUTIONAL: No weight loss, fever, chills, weakness or fatigue.  HEENT: Eyes:  No visual loss, blurred vision, double vision or yellow sclerae.No hearing loss, sneezing, congestion, runny nose or sore throat.  SKIN: No rash or itching.  CARDIOVASCULAR:  RESPIRATORY: No shortness of breath, cough or sputum.  GASTROINTESTINAL: No anorexia, nausea, vomiting or diarrhea. No abdominal pain or blood.  GENITOURINARY: No burning on urination, no polyuria NEUROLOGICAL: No headache, dizziness, syncope, paralysis, ataxia, numbness or tingling in the extremities. No change in bowel or bladder control.  MUSCULOSKELETAL: No muscle, back pain, joint pain or stiffness.  LYMPHATICS: No enlarged nodes. No history of splenectomy.  PSYCHIATRIC: No history of depression or anxiety.  ENDOCRINOLOGIC: No reports of sweating, cold or heat intolerance. No polyuria or polydipsia.  Marland Kitchen   Physical Examination There were no vitals filed for this visit. There were no vitals filed for this visit.  Gen: resting comfortably, no acute distress HEENT: no scleral icterus, pupils equal round and reactive, no palptable cervical adenopathy,  CV Resp: Clear to auscultation bilaterally GI: abdomen is soft, non-tender, non-distended, normal bowel sounds, no hepatosplenomegaly MSK: extremities are warm, no edema.  Skin: warm, no rash Neuro:  no focal deficits Psych: appropriate affect   Diagnostic Studies  Echo 04/2010 [facility] LVEF >55%, grade I diastolic dysfunction, mild MR   04/2010 [facility]Myoview No ischemia.  Exercised 8 minutes, 9 METs, 95% THR.   12/2007 Renal artery [place] No stenosis, normal kidney size.    06/01/13 [facility] EKG NSR, LAD   05/2014 Exercise MPI IMPRESSION: 1. No reversible ischemia or infarction.   2. Normal left ventricular wall motion.   3. Left ventricular ejection fraction 57%   4. Low-risk stress test findings*.     08/2014 Echo Study Conclusions  - Left ventricle: The cavity size was normal. Wall thickness was   normal. Systolic function was normal. The estimated ejection   fraction was in the range of 60% to 65%. Wall motion was normal;   there were no regional wall motion abnormalities. Doppler   parameters are consistent with abnormal left ventricular   relaxation (grade 1 diastolic dysfunction). - Aortic valve: Mildly calcified annulus. Trileaflet. - Mitral valve: Mildly calcified annulus. Mildly calcified leaflets   . There was mild regurgitation. - Tricuspid valve: There was mild regurgitation.   Assessment and Plan  1. HTN - appears to be controlled since recent medication changes, continue to monitor - check BMET with lisinopril change   2. Palpitations - no recent symptoms, conitnue to monitor      Antoine Poche, M.D.

## 2022-10-21 ENCOUNTER — Ambulatory Visit: Payer: Medicare HMO | Admitting: Family Medicine

## 2022-10-21 ENCOUNTER — Encounter: Payer: Self-pay | Admitting: Family Medicine

## 2022-11-01 DIAGNOSIS — Z20822 Contact with and (suspected) exposure to covid-19: Secondary | ICD-10-CM | POA: Diagnosis not present

## 2022-11-01 DIAGNOSIS — B349 Viral infection, unspecified: Secondary | ICD-10-CM | POA: Diagnosis not present

## 2022-11-11 DIAGNOSIS — M5106 Intervertebral disc disorders with myelopathy, lumbar region: Secondary | ICD-10-CM | POA: Diagnosis not present

## 2022-11-11 DIAGNOSIS — Z79891 Long term (current) use of opiate analgesic: Secondary | ICD-10-CM | POA: Diagnosis not present

## 2022-11-11 DIAGNOSIS — G894 Chronic pain syndrome: Secondary | ICD-10-CM | POA: Diagnosis not present

## 2022-11-11 DIAGNOSIS — M791 Myalgia, unspecified site: Secondary | ICD-10-CM | POA: Diagnosis not present

## 2022-11-11 DIAGNOSIS — Z79899 Other long term (current) drug therapy: Secondary | ICD-10-CM | POA: Diagnosis not present

## 2022-12-24 ENCOUNTER — Ambulatory Visit: Payer: Medicare HMO | Admitting: Family

## 2022-12-24 ENCOUNTER — Telehealth: Payer: Self-pay | Admitting: Family Medicine

## 2022-12-25 ENCOUNTER — Other Ambulatory Visit: Payer: Self-pay | Admitting: Family Medicine

## 2022-12-25 ENCOUNTER — Telehealth: Payer: Self-pay | Admitting: Family Medicine

## 2022-12-25 MED ORDER — FEMRING 0.05 MG/24HR VA RING: VAGINAL_RING | VAGINAL | 2 refills | Status: DC

## 2022-12-25 NOTE — Telephone Encounter (Signed)
Probably asking for refill on her Femring. [facility] has been [facility]

## 2022-12-25 NOTE — Telephone Encounter (Signed)
Daughter made aware. She has no further concerns.

## 2022-12-27 NOTE — Telephone Encounter (Signed)
Please inform her that it is not covered under her ins. Is she ok with going to a cream?

## 2022-12-27 NOTE — Telephone Encounter (Signed)
If agreeable, ok to send premarin 0.5gm vaginally twice weekly as needed.

## 2022-12-27 NOTE — Telephone Encounter (Signed)
Please see my question below.

## 2022-12-30 ENCOUNTER — Other Ambulatory Visit: Payer: Self-pay | Admitting: Family Medicine

## 2022-12-30 NOTE — Telephone Encounter (Signed)
I do not understand this completely because they say Premarin cream is not covered but conjugated estrogens are, so I sent it as conjugated estrogens which is essentially the generic for Premarin.

## 2022-12-30 NOTE — Telephone Encounter (Signed)
PREMARIN vaginal cream        Changed from: Estradiol Acetate (FEMRING) 0.05 MG/24HR RING   Pharmacy comment: Alternative Requested:THE PRESCRIBED MEDICATION IS NOT COVERED BY INSURANCE. PLEASE CONSIDER CHANGING TO ONE OF THE SUGGESTED COVERED ALTERNATIVES.   All Pharmacy Suggested Alternatives:  conjugated estrogens (PREMARIN) vaginal cream estradiol (ESTRACE) 0.1 MG/GM vaginal cream venlafaxine (EFFEXOR) 75 MG tablet

## 2023-01-06 ENCOUNTER — Other Ambulatory Visit: Payer: Self-pay | Admitting: Family Medicine

## 2023-01-06 ENCOUNTER — Telehealth: Payer: Self-pay | Admitting: *Deleted

## 2023-01-06 NOTE — Telephone Encounter (Signed)
Copied from CRM [id number]. Topic: Clinical - Medication Refill >> Jan 06, 2023 12:10 PM Sasha H wrote: Most Recent Primary Care Visit:  Provider: Bennie Pierini  Department: [facility]  Visit Type: OFFICE VISIT  Date: 10/02/2022  Medication: Pantoprazole 40 mg, Mirtazapine 15 mg, and Trazodone 50 mg  Has the patient contacted their pharmacy? Yes (Agent: If no, request that the patient contact the pharmacy for the refill. If patient does not wish to contact the pharmacy document the reason why and proceed with request.) (Agent: If yes, when and what did the pharmacy advise?)  Is this the correct pharmacy for this prescription? Yes If no, delete pharmacy and type the correct one.  This is the patient's preferred pharmacy:  [id number] [place] [place] - [address] [place] [address] Phone: [phone] Fax: [phone]  [facility] [place], [place] - [address] [place] [address] Phone: [phone] Fax: [phone]   Has the prescription been filled recently? Yes  Is the patient out of the medication? No  Has the patient been seen for an appointment in the last year OR does the patient have an upcoming appointment? Yes  Can we respond through MyChart? Yes  Agent: Please be advised that Rx refills may take up to 3 business days. We ask that you follow-up with your pharmacy.

## 2023-01-06 NOTE — Telephone Encounter (Signed)
Ok to wait for PCP. Has refills on file for mirtazapine 15 mg. Just picked up 90 day supply of protonix. Trazodone is not on patient's list.

## 2023-01-10 ENCOUNTER — Telehealth: Payer: Self-pay | Admitting: Cardiology

## 2023-01-10 ENCOUNTER — Telehealth: Payer: Self-pay | Admitting: Family Medicine

## 2023-01-10 DIAGNOSIS — K219 Gastro-esophageal reflux disease without esophagitis: Secondary | ICD-10-CM

## 2023-01-10 MED ORDER — EZETIMIBE 10 MG PO TABS: 10.0000 mg | ORAL_TABLET | Freq: Every day | ORAL | 0 refills | Status: DC

## 2023-01-10 NOTE — Telephone Encounter (Unsigned)
Copied from CRM [id number]. Topic: Clinical - Prescription Issue >> Jan 10, 2023 12:01 PM Roswell Nickel wrote: Reason for CRM: [facility]  calling to request refill for pantoprazole 40 mg please fax order to [phone]

## 2023-01-10 NOTE — Telephone Encounter (Signed)
*  STAT* If patient is at the pharmacy, call can be transferred to refill team.   1. Which medications need to be refilled? (please list name of each medication and dose if known)  ezetimibe (ZETIA) 10 MG tablet  2. Which pharmacy/location (including street and city if local pharmacy) is medication to be sent to? [facility] out of [place]:  Phone#: [phone] Fax#: [phone]  3. Do they need a 30 day or 90 day supply?   90 day supply

## 2023-01-10 NOTE — Telephone Encounter (Signed)
Lmtcb.

## 2023-01-13 ENCOUNTER — Other Ambulatory Visit: Payer: Self-pay | Admitting: Nurse Practitioner

## 2023-01-14 NOTE — Telephone Encounter (Signed)
No vm is set up

## 2023-01-14 NOTE — Telephone Encounter (Signed)
Trazodone was DISCONTINUED in march of this year.  She should NOT be taking both Mirtazapine and trazodone.

## 2023-01-16 ENCOUNTER — Ambulatory Visit: Payer: Medicare PPO | Admitting: Nurse Practitioner

## 2023-01-16 ENCOUNTER — Encounter: Payer: Self-pay | Admitting: Nurse Practitioner

## 2023-01-16 VITALS — BP 147/70 | HR 60 | Temp 97.7°F | Ht 62.0 in | Wt 116.4 lb

## 2023-01-16 DIAGNOSIS — B9689 Other specified bacterial agents as the cause of diseases classified elsewhere: Secondary | ICD-10-CM | POA: Diagnosis not present

## 2023-01-16 DIAGNOSIS — N951 Menopausal and female climacteric states: Secondary | ICD-10-CM | POA: Diagnosis not present

## 2023-01-16 DIAGNOSIS — N760 Acute vaginitis: Secondary | ICD-10-CM

## 2023-01-16 DIAGNOSIS — N898 Other specified noninflammatory disorders of vagina: Secondary | ICD-10-CM | POA: Diagnosis not present

## 2023-01-16 DIAGNOSIS — R300 Dysuria: Secondary | ICD-10-CM

## 2023-01-16 LAB — URINALYSIS, ROUTINE W REFLEX MICROSCOPIC
Bilirubin, UA: NEGATIVE
Glucose, UA: NEGATIVE
Ketones, UA: NEGATIVE
Leukocytes,UA: NEGATIVE
Nitrite, UA: NEGATIVE
Protein,UA: NEGATIVE
RBC, UA: NEGATIVE
Specific Gravity, UA: >1.03 — ABNORMAL HIGH (ref 1.005–1.030)
Urobilinogen, Ur: 0.2 mg/dL (ref 0.2–1.0)
pH, UA: 5.5 (ref 5.0–7.5)

## 2023-01-16 LAB — WET PREP FOR TRICH, YEAST, CLUE
Clue Cell Exam: POSITIVE — AB
Trichomonas Exam: NEGATIVE
Yeast Exam: NEGATIVE

## 2023-01-16 MED ORDER — ESTROGENS CONJUGATED 0.625 MG/GM VA CREA: 1.0000 | TOPICAL_CREAM | Freq: Every day | VAGINAL | 1 refills | Status: DC

## 2023-01-16 MED ORDER — METRONIDAZOLE 500 MG PO TABS
500.0000 mg | ORAL_TABLET | Freq: Two times a day (BID) | ORAL | 0 refills | Status: DC
Start: 2023-01-16 — End: 2023-04-02

## 2023-01-16 NOTE — Progress Notes (Signed)
Acute Office Visit  Subjective:     Patient ID: Kaitlyn Serrano, female    DOB: 14-May-1940, 82 y.o.   MRN: [id number]  Chief Complaint  Patient presents with   Dysuria    Burning and urinary frequency for a month having back pain as well   Urinary Frequency    HPI SUBJECTIVE: Kaitlyn Serrano is a 82 y.o. female who complains of urinary frequency, urgency and dysuria x several weeks "hurting in my back going down my legs, denies vaginal discharge, reports vaginal itchiness and dryness  Active Ambulatory Problems    Diagnosis Date Noted   Hemorrhoids 06/25/2011   Seasonal allergic rhinitis 06/30/2012   Hypertension 08/13/2012   Hyperlipemia 08/13/2012   GAD (generalized anxiety disorder) 10/08/2013   Vitamin D deficiency 10/08/2013   GERD (gastroesophageal reflux disease) 10/08/2013   Constipation 01/16/2015   Insomnia 01/16/2015   Major depression 12/02/2016   Prediabetes 02/12/2017   Overweight (BMI 25.0-29.9) 07/10/2018   Right hip pain 11/03/2018   S/P right THA, AA 06/08/2019   Moderate episode of recurrent major depressive disorder (HCC) 03/27/2020   Sinus congestion 03/27/2020   High risk medication use 12/30/2017   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   Acute head injury 09/19/2022   Acute pain of left knee 09/19/2022   Fall 09/19/2022   Acute nonintractable headache 09/19/2022   Resolved Ambulatory Problems    Diagnosis Date Noted   Sore throat 06/30/2012   Wheezing 06/30/2012   Atypical chest pain 06/01/2013   Urinary tract infection, site not specified 10/03/2015   Benzodiazepine dependence (HCC) 12/30/2017   Controlled substance agreement signed 12/30/2017   Past Medical History:  Diagnosis Date   Anxiety    Chronic pain syndrome    Complication of anesthesia    Depression    Diverticulosis    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   Dysphagia    Dyspnea    Generalized weakness    High cholesterol    Hoarseness    Hx of cardiovascular stress test     Intervertebral disc disorder with myelopathy, lumbar region    Lumbago    Lumbar spine pain    PONV (postoperative nausea and vomiting)    Renal insufficiency    Ringing in ears    Spondylosis with myelopathy, lumbar region     ROS Negative unless indicated in HPI    Objective:    BP (!) 147/70   Pulse 60   Temp 97.7 F (36.5 C) (Temporal)   Ht 5\' 2"  (1.575 m)   Wt 116 lb 6.4 oz (52.8 kg)   SpO2 93%   BMI 21.29 kg/m  BP Readings from Last 3 Encounters:  01/16/23 (!) 147/70  10/02/22 (!) 173/84  09/19/22 (!) 144/77   Wt Readings from Last 3 Encounters:  01/16/23 116 lb 6.4 oz (52.8 kg)  10/02/22 105 lb (47.6 kg)  09/19/22 105 lb (47.6 kg)      Physical Exam  Appears well, in no apparent distress.  Vital signs are normal. The abdomen is soft without tenderness, guarding, mass, rebound or organomegaly. No CVA tenderness or inguinal adenopathy noted. Urine dipstick shows negative for all components.  Micro exam: not done.  Microscopic wet-mount exam shows clue cells, 0-2 white blood cells, few epithelial cells and many bacteria.  No results found for any visits on 01/16/23.      Assessment & Plan:  Dysuria -     Urinalysis, Routine w reflex microscopic  Kaitlyn Serrano is a  82 year old Caucasian female, no acute distress Vaginal dryness: Premarin 1 application at bedtime BV: Flagyl 500 mg twice daily for 7 days Avoid alcohol while taking this medication Increase hydration  Call or return to clinic prn if these symptoms worsen or fail to improve as anticipated.  Return if symptoms worsen or fail to improve.  Arrie Aran Santa Lighter, DNP [facility] [address] [place], [place] [address] [phone]

## 2023-01-24 ENCOUNTER — Other Ambulatory Visit: Payer: Self-pay | Admitting: Nurse Practitioner

## 2023-01-29 ENCOUNTER — Telehealth: Payer: Self-pay | Admitting: Cardiology

## 2023-01-29 MED ORDER — EZETIMIBE 10 MG PO TABS: 10.0000 mg | ORAL_TABLET | Freq: Every day | ORAL | 1 refills | Status: DC

## 2023-01-29 NOTE — Telephone Encounter (Signed)
*  STAT* If patient is at the pharmacy, call can be transferred to refill team.   1. Which medications need to be refilled? (please list name of each medication and dose if known)   ezetimibe (ZETIA) 10 MG tablet   2. Would you like to learn more about the convenience, safety, & potential cost savings by using the [facility]?   3. Are you open to using the [facility] (Type [facility]. ).  4. Which pharmacy/location (including street and city if local pharmacy) is medication to be sent to?  [facility] - [place], [place] - [address]      5. Do they need a 30 day or 90 day supply?   90 day  Caller Tobi Bastos) stated patient still has some medication left.  Caller stated they had not received the previous refill request and wants the prescription re-sent.

## 2023-01-29 NOTE — Telephone Encounter (Signed)
Refill sent.

## 2023-02-04 ENCOUNTER — Other Ambulatory Visit: Payer: Self-pay

## 2023-02-04 ENCOUNTER — Telehealth: Payer: Self-pay | Admitting: Family Medicine

## 2023-02-04 DIAGNOSIS — K219 Gastro-esophageal reflux disease without esophagitis: Secondary | ICD-10-CM

## 2023-02-04 NOTE — Telephone Encounter (Unsigned)
Copied from CRM [id number]. Topic: Clinical - Medication Refill >> Feb 04, 2023  2:17 PM Prudencio Pair wrote: Most Recent Primary Care Visit:  Provider: [facility] Santa Lighter, [place]  Department: [facility]  Visit Type: ACUTE  Date: 01/16/2023  Medication: Pantoprazole 40mg  tablet  Has the patient contacted their pharmacy? Yes (Agent: If no, request that the patient contact the pharmacy for the refill. If patient does not wish to contact the pharmacy document the reason why and proceed with request.) (Agent: If yes, when and what did the pharmacy advise?)  Is this the correct pharmacy for this prescription? Yes If no, delete pharmacy and type the correct one.  This is the patient's preferred pharmacy:   [facility] - [place] - [address] [place] [address] Phone: [phone] Fax: [phone]   Has the prescription been filled recently? No  Is the patient out of the medication? N/A  Has the patient been seen for an appointment in the last year OR does the patient have an upcoming appointment? Yes  Can we respond through MyChart? No  Agent: Please be advised that Rx refills may take up to 3 business days. We ask that you follow-up with your pharmacy.

## 2023-02-05 MED ORDER — PANTOPRAZOLE SODIUM 40 MG PO TBEC: 40.0000 mg | DELAYED_RELEASE_TABLET | Freq: Two times a day (BID) | ORAL | 3 refills | Status: DC

## 2023-02-06 ENCOUNTER — Other Ambulatory Visit: Payer: Self-pay | Admitting: Family Medicine

## 2023-02-06 DIAGNOSIS — G894 Chronic pain syndrome: Secondary | ICD-10-CM | POA: Diagnosis not present

## 2023-02-06 DIAGNOSIS — M5106 Intervertebral disc disorders with myelopathy, lumbar region: Secondary | ICD-10-CM | POA: Diagnosis not present

## 2023-02-06 DIAGNOSIS — M791 Myalgia, unspecified site: Secondary | ICD-10-CM | POA: Diagnosis not present

## 2023-02-06 DIAGNOSIS — F03B11 Unspecified dementia, moderate, with agitation: Secondary | ICD-10-CM

## 2023-02-06 NOTE — Telephone Encounter (Signed)
Copied from CRM [id number]. Topic: Clinical - Medication Refill >> Feb 06, 2023 12:06 PM Desma Mcgregor wrote: Most Recent Primary Care Visit:  Provider: [facility] Santa Lighter, [place]  Department: [facility]  Visit Type: ACUTE  Date: 01/16/2023  Medication: donepezil (ARICEPT) 10 MG tablet and celecoxib (CELEBREX) 200 MG capsule  Has the patient contacted their pharmacy? Yes (Agent: If no, request that the patient contact the pharmacy for the refill. If patient does not wish to contact the pharmacy document the reason why and proceed with request.) (Agent: If yes, when and what did the pharmacy advise?)  No refills available  Is this the correct pharmacy for this prescription? Yes If no, delete pharmacy and type the correct one.  This is the patient's preferred pharmacy:  [facility] - [place], [place] - [address] [place] [address] Phone: [phone] Fax: [phone]   Has the prescription been filled recently? No  Is the patient out of the medication? No, but has 2 days left of both.  Has the patient been seen for an appointment in the last year OR does the patient have an upcoming appointment? Yes  Can we respond through MyChart? No  Agent: Please be advised that Rx refills may take up to 3 business days. We ask that you follow-up with your pharmacy.

## 2023-02-07 MED ORDER — CELECOXIB 200 MG PO CAPS: 200.0000 mg | ORAL_CAPSULE | Freq: Every day | ORAL | 2 refills | Status: DC

## 2023-02-07 MED ORDER — DONEPEZIL HCL 10 MG PO TABS: 10.0000 mg | ORAL_TABLET | Freq: Every day | ORAL | 3 refills | Status: DC

## 2023-02-11 ENCOUNTER — Ambulatory Visit (INDEPENDENT_AMBULATORY_CARE_PROVIDER_SITE_OTHER): Payer: Medicare PPO | Admitting: Family Medicine

## 2023-02-11 ENCOUNTER — Encounter: Payer: Self-pay | Admitting: Family Medicine

## 2023-02-11 VITALS — BP 163/67 | HR 55 | Temp 97.7°F | Ht 62.0 in | Wt 117.4 lb

## 2023-02-11 DIAGNOSIS — J069 Acute upper respiratory infection, unspecified: Secondary | ICD-10-CM

## 2023-02-11 DIAGNOSIS — J40 Bronchitis, not specified as acute or chronic: Secondary | ICD-10-CM | POA: Diagnosis not present

## 2023-02-11 DIAGNOSIS — J329 Chronic sinusitis, unspecified: Secondary | ICD-10-CM | POA: Diagnosis not present

## 2023-02-11 MED ORDER — LEVOFLOXACIN 500 MG PO TABS: 500.0000 mg | ORAL_TABLET | Freq: Every day | ORAL | 0 refills | Status: DC

## 2023-02-11 MED ORDER — PROMETHAZINE-DM 6.25-15 MG/5ML PO SYRP: 5.0000 mL | ORAL_SOLUTION | Freq: Four times a day (QID) | ORAL | 0 refills | Status: DC | PRN

## 2023-02-11 NOTE — Progress Notes (Signed)
Chief Complaint  Patient presents with   Cough   Nasal Congestion    HPI  Patient presents today for Patient presents with upper respiratory congestion. Rhinorrhea that is frequently purulent. There is moderate sore throat. Patient reports coughing frequently as well.  green sputum noted. There is no fever, chills or sweats. The patient reports being short of breath. Onset was 3-5 days ago. Gradually worsening. Tried OTCs without improvement. Chest is tight.   PMH: Smoking status noted ROS: Per HPI  Objective: BP (!) 163/67   Pulse (!) 55   Temp 97.7 F (36.5 C)   Ht 5\' 2"  (1.575 m)   Wt 117 lb 6.4 oz (53.3 kg)   SpO2 95%   BMI 21.47 kg/m  Gen: NAD, alert, cooperative with exam HEENT: NCAT, Nasal passages swollen,  CV: RRR, good S1/S2, no murmur Resp: Bronchitis changes with scattered wheezes, non-labored Ext: No edema, warm Neuro: Alert and oriented, No gross deficits  Assessment and plan:  1. Upper respiratory infection with cough and congestion   2. Sinobronchitis     Meds ordered this encounter  Medications   promethazine-dextromethorphan (PROMETHAZINE-DM) 6.25-15 MG/5ML syrup    Sig: Take 5 mLs by mouth 4 (four) times daily as needed for cough.    Dispense:  240 mL    Refill:  0   levofloxacin (LEVAQUIN) 500 MG tablet    Sig: Take 1 tablet (500 mg total) by mouth daily.    Dispense:  7 tablet    Refill:  0    Orders Placed This Encounter  Procedures   COVID-19, Flu A+B and RSV    Previously tested for COVID-19:   Unknown    Resident in a congregate (group) care setting:   Unknown    Is the patient student?:   No    Employed in healthcare setting:   Unknown    Pregnant:   Unknown    Has patient completed COVID vaccination(s) (2 doses of Pfizer/Moderna 1 dose of [employer]):   Unknown    Release to patient:   Immediate [1]    Follow up as needed.  Mechele Claude, MD

## 2023-02-13 LAB — COVID-19, FLU A+B AND RSV
Influenza A, NAA: NOT DETECTED
Influenza B, NAA: NOT DETECTED
RSV, NAA: NOT DETECTED
SARS-CoV-2, NAA: NOT DETECTED

## 2023-02-24 ENCOUNTER — Other Ambulatory Visit: Payer: Self-pay | Admitting: Cardiology

## 2023-04-01 ENCOUNTER — Encounter (HOSPITAL_COMMUNITY): Payer: Self-pay

## 2023-04-01 ENCOUNTER — Observation Stay (HOSPITAL_COMMUNITY)
Admission: EM | Admit: 2023-04-01 | Discharge: 2023-04-05 | Disposition: A | Discharge status: Home Health | Payer: Medicare HMO | Attending: Internal Medicine | Admitting: Internal Medicine

## 2023-04-01 ENCOUNTER — Emergency Department (HOSPITAL_COMMUNITY): Payer: Medicare HMO

## 2023-04-01 ENCOUNTER — Other Ambulatory Visit: Payer: Self-pay

## 2023-04-01 DIAGNOSIS — I213 ST elevation (STEMI) myocardial infarction of unspecified site: Secondary | ICD-10-CM | POA: Diagnosis not present

## 2023-04-01 DIAGNOSIS — I5022 Chronic systolic (congestive) heart failure: Secondary | ICD-10-CM | POA: Insufficient documentation

## 2023-04-01 DIAGNOSIS — Z87891 Personal history of nicotine dependence: Secondary | ICD-10-CM | POA: Insufficient documentation

## 2023-04-01 DIAGNOSIS — R7989 Other specified abnormal findings of blood chemistry: Secondary | ICD-10-CM | POA: Diagnosis not present

## 2023-04-01 DIAGNOSIS — I10 Essential (primary) hypertension: Secondary | ICD-10-CM | POA: Diagnosis present

## 2023-04-01 DIAGNOSIS — I110 Hypertensive heart disease with heart failure: Secondary | ICD-10-CM | POA: Insufficient documentation

## 2023-04-01 DIAGNOSIS — I2510 Atherosclerotic heart disease of native coronary artery without angina pectoris: Secondary | ICD-10-CM | POA: Diagnosis not present

## 2023-04-01 DIAGNOSIS — R0789 Other chest pain: Principal | ICD-10-CM

## 2023-04-01 DIAGNOSIS — R55 Syncope and collapse: Secondary | ICD-10-CM | POA: Diagnosis not present

## 2023-04-01 DIAGNOSIS — N179 Acute kidney failure, unspecified: Secondary | ICD-10-CM

## 2023-04-01 DIAGNOSIS — E785 Hyperlipidemia, unspecified: Secondary | ICD-10-CM | POA: Diagnosis not present

## 2023-04-01 DIAGNOSIS — I700 Atherosclerosis of aorta: Secondary | ICD-10-CM | POA: Diagnosis not present

## 2023-04-01 DIAGNOSIS — I4581 Long QT syndrome: Secondary | ICD-10-CM | POA: Diagnosis not present

## 2023-04-01 DIAGNOSIS — R29898 Other symptoms and signs involving the musculoskeletal system: Secondary | ICD-10-CM | POA: Diagnosis not present

## 2023-04-01 DIAGNOSIS — I5021 Acute systolic (congestive) heart failure: Secondary | ICD-10-CM | POA: Diagnosis not present

## 2023-04-01 DIAGNOSIS — Z79899 Other long term (current) drug therapy: Secondary | ICD-10-CM | POA: Diagnosis not present

## 2023-04-01 DIAGNOSIS — I214 Non-ST elevation (NSTEMI) myocardial infarction: Secondary | ICD-10-CM

## 2023-04-01 DIAGNOSIS — Z96643 Presence of artificial hip joint, bilateral: Secondary | ICD-10-CM | POA: Diagnosis not present

## 2023-04-01 DIAGNOSIS — R079 Chest pain, unspecified: Secondary | ICD-10-CM | POA: Diagnosis not present

## 2023-04-01 DIAGNOSIS — I499 Cardiac arrhythmia, unspecified: Secondary | ICD-10-CM | POA: Diagnosis not present

## 2023-04-01 DIAGNOSIS — K219 Gastro-esophageal reflux disease without esophagitis: Secondary | ICD-10-CM | POA: Diagnosis present

## 2023-04-01 DIAGNOSIS — R778 Other specified abnormalities of plasma proteins: Secondary | ICD-10-CM | POA: Diagnosis not present

## 2023-04-01 DIAGNOSIS — Z7982 Long term (current) use of aspirin: Secondary | ICD-10-CM | POA: Diagnosis not present

## 2023-04-01 DIAGNOSIS — R0689 Other abnormalities of breathing: Secondary | ICD-10-CM | POA: Diagnosis not present

## 2023-04-01 DIAGNOSIS — R9431 Abnormal electrocardiogram [ECG] [EKG]: Secondary | ICD-10-CM

## 2023-04-01 DIAGNOSIS — J9811 Atelectasis: Secondary | ICD-10-CM | POA: Diagnosis not present

## 2023-04-01 DIAGNOSIS — Z981 Arthrodesis status: Secondary | ICD-10-CM | POA: Diagnosis not present

## 2023-04-01 DIAGNOSIS — I6782 Cerebral ischemia: Secondary | ICD-10-CM | POA: Diagnosis not present

## 2023-04-01 DIAGNOSIS — J42 Unspecified chronic bronchitis: Secondary | ICD-10-CM | POA: Diagnosis not present

## 2023-04-01 LAB — CBC WITH DIFFERENTIAL/PLATELET
Abs Immature Granulocytes: 0.06 10*3/uL (ref 0.00–0.07)
Basophils Absolute: 0.1 10*3/uL (ref 0.0–0.1)
Basophils Relative: 1 %
Eosinophils Absolute: 0.2 10*3/uL (ref 0.0–0.5)
Eosinophils Relative: 2 %
HCT: 39.7 % (ref 36.0–46.0)
Hemoglobin: 13.3 g/dL (ref 12.0–15.0)
Immature Granulocytes: 1 %
Lymphocytes Relative: 9 %
Lymphs Abs: 1.1 10*3/uL (ref 0.7–4.0)
MCH: 31.2 pg (ref 26.0–34.0)
MCHC: 33.5 g/dL (ref 30.0–36.0)
MCV: 93.2 fL (ref 80.0–100.0)
Monocytes Absolute: 1.1 10*3/uL — ABNORMAL HIGH (ref 0.1–1.0)
Monocytes Relative: 10 %
Neutro Abs: 8.9 10*3/uL — ABNORMAL HIGH (ref 1.7–7.7)
Neutrophils Relative %: 77 %
Platelets: 215 10*3/uL (ref 150–400)
RBC: 4.26 MIL/uL (ref 3.87–5.11)
RDW: 13.3 % (ref 11.5–15.5)
WBC: 11.4 10*3/uL — ABNORMAL HIGH (ref 4.0–10.5)
nRBC: 0 % (ref 0.0–0.2)

## 2023-04-01 LAB — I-STAT CHEM 8, ED
BUN: 24 mg/dL — ABNORMAL HIGH (ref 8–23)
Calcium, Ion: 1.13 mmol/L — ABNORMAL LOW (ref 1.15–1.40)
Chloride: 106 mmol/L (ref 98–111)
Creatinine, Ser: 1.1 mg/dL — ABNORMAL HIGH (ref 0.44–1.00)
Glucose, Bld: 108 mg/dL — ABNORMAL HIGH (ref 70–99)
HCT: 39 % (ref 36.0–46.0)
Hemoglobin: 13.3 g/dL (ref 12.0–15.0)
Potassium: 3.7 mmol/L (ref 3.5–5.1)
Sodium: 140 mmol/L (ref 135–145)
TCO2: 25 mmol/L (ref 22–32)

## 2023-04-01 LAB — COMPREHENSIVE METABOLIC PANEL
ALT: 16 U/L (ref 0–44)
AST: 34 U/L (ref 15–41)
Albumin: 3.1 g/dL — ABNORMAL LOW (ref 3.5–5.0)
Alkaline Phosphatase: 54 U/L (ref 38–126)
Anion gap: 10 (ref 5–15)
BUN: 21 mg/dL (ref 8–23)
CO2: 24 mmol/L (ref 22–32)
Calcium: 8.7 mg/dL — ABNORMAL LOW (ref 8.9–10.3)
Chloride: 106 mmol/L (ref 98–111)
Creatinine, Ser: 1.02 mg/dL — ABNORMAL HIGH (ref 0.44–1.00)
GFR, Estimated: 55 mL/min — ABNORMAL LOW (ref >60)
Glucose, Bld: 111 mg/dL — ABNORMAL HIGH (ref 70–99)
Potassium: 3.7 mmol/L (ref 3.5–5.1)
Sodium: 140 mmol/L (ref 135–145)
Total Bilirubin: 0.5 mg/dL (ref 0.0–1.2)
Total Protein: 5.9 g/dL — ABNORMAL LOW (ref 6.5–8.1)

## 2023-04-01 LAB — TROPONIN I (HIGH SENSITIVITY)
Troponin I (High Sensitivity): 746 ng/L (ref <18)
Troponin I (High Sensitivity): 418 ng/L (ref <18)

## 2023-04-01 MED ORDER — ACETAMINOPHEN 650 MG RE SUPP: 650.0000 mg | Freq: Four times a day (QID) | RECTAL | Status: DC | PRN

## 2023-04-01 MED ORDER — ACETAMINOPHEN 325 MG PO TABS
650.0000 mg | ORAL_TABLET | Freq: Four times a day (QID) | ORAL | Status: DC | PRN
Start: 2023-04-01 — End: 2023-04-05
  Administered 2023-04-02: 650 mg via ORAL
  Filled 2023-04-01 (×2): qty 2

## 2023-04-01 MED ORDER — SODIUM CHLORIDE 0.9 % IV SOLN: INTRAVENOUS | Status: AC

## 2023-04-01 MED ORDER — ACETAMINOPHEN 500 MG PO TABS
1000.0000 mg | ORAL_TABLET | Freq: Once | ORAL | Status: AC
Start: 2023-04-01 — End: 2023-04-01
  Administered 2023-04-01: 1000 mg via ORAL
  Filled 2023-04-01: qty 2

## 2023-04-01 MED ORDER — ENOXAPARIN SODIUM 30 MG/0.3ML IJ SOSY
30.0000 mg | PREFILLED_SYRINGE | INTRAMUSCULAR | Status: DC
  Administered 2023-04-02 – 2023-04-03 (×2): 30 mg via SUBCUTANEOUS
  Filled 2023-04-01 (×2): qty 0.3

## 2023-04-01 MED ORDER — IOHEXOL 350 MG/ML SOLN
65.0000 mL | Freq: Once | INTRAVENOUS | Status: AC | PRN
  Administered 2023-04-01: 65 mL via INTRAVENOUS

## 2023-04-01 NOTE — ED Triage Notes (Signed)
 Pt BIB [facility] from home d/t CP that started after bending over a chair, pt reports she felt like she over exerted herself. When she stood there was Rt sided CP, EMS endorses the CP was reproducible, she has taken 4 baby ASA & had ST elevation in lead V2, does have 2 bil 20g PIV. No cardiac Hx, does have some mild dementia (per caretaker reporting to EMS).

## 2023-04-01 NOTE — Consult Note (Addendum)
 Cardiology Consultation   Patient ID: Kaitlyn Serrano MRN: [id number]; DOB: Oct 13, 1940 Admit date: 04/01/2023 Date of Consult: 04/01/2023 PCP:  [name], DO [facility] Providers Cardiologist:  Alvan Carrier, MD        Patient Profile:   Kaitlyn Serrano is a 83 y.o. female with a hx of HTN, HLD, Anxiety, chronic pain syndrome, depression, GERD, mild dementia, who presented for chest pain. Cardiology was consulted by [facility] medicine.   History of Present Illness:   Patient is a poor historian and had difficulty recalling prior events leading to her presentation to the ER. Per EMS report, patient initially developed chest pain that started after bending over a chair that was reportedly reproducible. Patient had felt like she had over-exerted herself. She took ASA x4. Initial EKG was concerning for isolated V2 ST elevation.   During my interview, patient reports that she lives at home with her daughter [name] (not available for discussion). She later recalled that she fell down earlier today, but was unable to recall the situation. She Reports a history of chest pain on/off and shortness of breath that also occurs when walking around her house that started 1 week ago; she reported being scared about telling anyone about these symptoms previously. She reports her activity levels have declined over the past week. She has not tried to go up a flight of stairs, does not use any mobility assist devices. She reports her chest pain is currently 2-3/10, but is more concerned about her LUE PIV site.   Patient's chest pain was reproducible on exam at her lateral 4/5th ribs that she reports was similar to her previous chest pain. Her chest pain is otherwise non-positional and does not radiate.   Labs notable for: Initial trop 746 -> 418, K 3.7, Cr 1.02, WBC 11.4, hgb 13.3.  Imaging: CT Head negative. CTPE negative for PE; did demonstrate some coronary artery calcficiation.    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 family unaware   Dysphagia    Dyspnea    Generalized weakness    GERD (gastroesophageal reflux disease)    GERD (gastroesophageal reflux disease)    High cholesterol    Hoarseness    Hx of cardiovascular stress test    negative 2012 and 2016   Hypertension    Insomnia    Intervertebral disc disorder with myelopathy, lumbar region    Lumbago    Lumbar spine pain    PONV (postoperative nausea and vomiting)    confusion after anesthesia   Renal insufficiency    Ringing in ears    resolved   Spondylosis with myelopathy, lumbar region    legs, back    Past Surgical History:  Procedure Laterality Date   ABDOMINAL HYSTERECTOMY     BACK SURGERY     BONE MARROW ASPIRATION     CERVICAL SPINE SURGERY     EYE SURGERY Bilateral    Lasic   FIXATION KYPHOPLASTY     LAMINOTOMY     SPINAL FUSION     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 Authorizing Provider  aspirin  EC 81 MG tablet Take 81 mg by mouth daily. Swallow whole.   Yes [provider]  celecoxib  (CELEBREX ) 200 MG capsule Take 1 capsule (200 mg total) by mouth daily. 02/07/23  Yes Jolinda Potter M, DO  cyclobenzaprine  (FLEXERIL ) 10 MG tablet Take 10 mg by mouth 3 (three) times daily as  needed for muscle spasms.   Yes [provider]  ezetimibe  (ZETIA ) 10 MG tablet TAKE ONE TABLET BY MOUTH DAILY AT 9AM 02/24/23  Yes Branch, Dorn FALCON, MD  levofloxacin  (LEVAQUIN ) 500 MG tablet Take 1 tablet (500 mg total) by mouth daily. 02/11/23  Yes Zollie Lowers, MD  pantoprazole  (PROTONIX ) 40 MG tablet Take 1 tablet (40 mg total) by mouth 2 (two) times daily. 02/05/23  Yes Gottschalk, Potter M, DO  polyethylene glycol (MIRALAX  / GLYCOLAX ) 17 g packet Take 17 g by mouth 2 (two) times daily. Patient taking differently: Take 17 g by mouth daily as needed for mild constipation. 05/15/22  Yes Patti Rosina SAUNDERS, PA-C  traZODone  (DESYREL ) 50 MG tablet Take 50 mg by mouth at bedtime as needed for sleep.   Yes [provider]  atenolol  (TENORMIN ) 25 MG tablet Take 1 tablet (25 mg total) by mouth daily. 04/12/22   Jolinda Potter HERO, DO  busPIRone  (BUSPAR ) 10 MG tablet Take 2 tablets (20 mg total) by mouth 2 (two) times daily. 06/24/22   Jolinda Potter HERO, DO  conjugated estrogens  (PREMARIN ) vaginal cream Place 1 Applicatorful vaginally daily. 01/16/23   [place] Sebastian Pool, NP  diltiazem  (TIADYLT  ER) 120 MG 24 hr capsule TAKE 1 CAPSULE BY MOUTH EVERY DAY 04/12/22   Jolinda Potter M, DO  docusate sodium  (COLACE) 100 MG capsule Take 1 capsule (100 mg total) by mouth 2 (two) times daily. 06/09/19   Danella Cough, PA-C  donepezil  (ARICEPT ) 10 MG tablet Take 1 tablet (10 mg total) by mouth at bedtime. For dementia 02/07/23   Jolinda Potter HERO, DO  hydrALAZINE  (APRESOLINE ) 25 MG tablet TAKE 1 TABLET BY MOUTH TWICE A DAY 04/02/21   Alvan Dorn FALCON, MD  HYDROcodone -acetaminophen  (NORCO) 10-325 MG tablet Take 1 tablet by mouth every 4 (four) hours as needed for severe pain.    [provider]  ipratropium (ATROVENT ) 0.06 % nasal spray Place 1 spray into both nostrils 2 (two) times daily as needed for rhinitis. 02/26/21   [provider]  lisinopril  (ZESTRIL ) 40 MG  tablet Take 1 tablet (40 mg total) by mouth daily. 04/12/22   Jolinda Potter HERO, DO  loratadine  (CLARITIN ) 10 MG tablet Take 1 tablet by mouth once daily 12/25/21   Jolinda Potter M, DO  metroNIDAZOLE  (FLAGYL ) 500 MG tablet Take 1 tablet (500 mg total) by mouth 2 (two) times daily. Patient not taking: Reported on 04/01/2023 01/16/23   Deitra Morton Sebastian Pool, NP  mirtazapine  (REMERON ) 15 MG tablet Take 1 tablet (15 mg total) by mouth at bedtime. For anxiety/ appetite/ sleep 06/24/22   Jolinda Potter HERO, DO  promethazine -dextromethorphan (PROMETHAZINE -DM) 6.25-15 MG/5ML syrup Take 5 mLs by mouth 4 (four) times daily as needed for cough. Patient not taking: Reported on 04/01/2023 02/11/23  Zollie Lowers, MD  tiZANidine (ZANAFLEX) 2 MG tablet Take 2 mg by mouth 2 (two) times daily.    [provider]    Inpatient Medications: Scheduled Meds:  acetaminophen   1,000 mg Oral Once   Continuous Infusions:  PRN Meds:   Allergies:    Allergies  Allergen Reactions   Bee Venom Anaphylaxis   Hydrocodone  Swelling   Penicillins Anaphylaxis and Rash    Has patient had a PCN reaction causing immediate rash, facial/tongue/throat swelling, SOB or lightheadedness with hypotension: Yes Has patient had a PCN reaction causing severe rash involving mucus membranes or skin necrosis: Yes Has patient had a PCN reaction that required hospitalization: Yes Has patient had a PCN reaction occurring within the last 10 years: No If all of the above answers are NO, then may proceed with Cephalosporin use.    Amlodipine  Swelling   Elavil  [Amitriptyline  Hcl] Other (See Comments)    Confusion, hallucinations   Statins Swelling and Other (See Comments)    Peeling of skin    Social History:   Social History   Socioeconomic History   Marital status: Divorced    Spouse name: Not on file   Number of children: 2   Years of education: Not on file   Highest education level: Not on file  Occupational  History   Not on file  Tobacco Use   Smoking status: Former    Current packs/day: 0.00    Average packs/day: 4.0 packs/day for 3.7 years (14.8 ttl pk-yrs)    Types: Cigarettes    Start date: 06/10/1960    Quit date: 02/20/1964    Years since quitting: 59.1   Smokeless tobacco: Never  Vaping Use   Vaping status: Never Used  Substance and Sexual Activity   Alcohol use: No    Alcohol/week: 0.0 standard drinks of alcohol   Drug use: No   Sexual activity: Not Currently    Birth control/protection: None  Other Topics Concern   Not on file  Social History Narrative   ** Merged History Encounter **       ** Merged History Encounter **  Ms Merle lives alone. She is separated/divorced from her second husband and widowed by her first husband. She has twin daughters, Zebedee and Reena. Zebedee has been a cause of stress for her and she believes was preven   ting her from getting medical care that she needed. She is no longer talking to [place]. She states that Reena is involved in her care and that she trusts her to have her best interest in mind.      Social Drivers of Corporate Investment Banker Strain: Low Risk  (07/11/2022)   Overall Financial Resource Strain (CARDIA)    Difficulty of Paying Living Expenses: Not hard at all  Food Insecurity: No Food Insecurity (07/11/2022)   Hunger Vital Sign    Worried About Running Out of Food in the Last Year: Never true    Ran Out of Food in the Last Year: Never true  Transportation Needs: No Transportation Needs (07/11/2022)   PRAPARE - Administrator, Civil Service (Medical): No    Lack of Transportation (Non-Medical): No  Physical Activity: Insufficiently Active (07/11/2022)   Exercise Vital Sign    Days of Exercise per Week: 3 days    Minutes of Exercise per Session: 30 min  Stress: No Stress Concern Present (07/11/2022)   [name] - Occupational Stress Questionnaire    Feeling  of Stress : Not at  all  Social Connections: Moderately Isolated (07/11/2022)   Social Connection and Isolation Panel [NHANES]    Frequency of Communication with Friends and Family: More than three times a week    Frequency of Social Gatherings with Friends and Family: More than three times a week    Attends Religious Services: More than 4 times per year    Active Member of [employer] or Organizations: No    Attends Banker Meetings: Never    Marital Status: Widowed  Intimate Partner Violence: Not At Risk (07/11/2022)   Humiliation, Afraid, Rape, and Kick questionnaire    Fear of Current or Ex-Partner: No    Emotionally Abused: No    Physically Abused: No    Sexually Abused: No    Family History:   Family History  Problem Relation Age of Onset   Diabetes Mother    Heart disease Mother    Hypertension Mother    Gallbladder disease Mother    Heart disease Father    Pneumonia Sister    Breast cancer Paternal Grandmother    Hypertension Brother    Hyperlipidemia Brother    Hypertension Child    Gallbladder disease Other      ROS:  Please see the history of present illness.  All other ROS reviewed and negative.     Physical Exam/Data:   Vitals:   04/01/23 1745 04/01/23 1800 04/01/23 1815 04/01/23 2051  BP: (!) 123/57 123/62 120/78   Pulse: 71 71 72   Resp: 14 17 15    Temp:    98.6 F (37 C)  TempSrc:    Oral  SpO2: 95% 95% 97%   Weight:      Height:       No intake or output data in the 24 hours ending 04/01/23 2331    04/01/2023    4:47 PM 02/11/2023   11:13 AM 01/16/2023    2:20 PM  Last 3 Weights  Weight (lbs) 125 lb 117 lb 6.4 oz 116 lb 6.4 oz  Weight (kg) 56.7 kg 53.252 kg 52.799 kg     Body mass index is 22.86 kg/m.  General:  Well nourished, well developed, in no acute distress HEENT: normal Neck: no JVD Vascular: No carotid bruits; Distal pulses 2+ bilaterally Cardiac:  normal S1, S2; RRR; no murmur, ttp along mid-clavicular to lateral 4th rib Lungs:  clear to  auscultation bilaterally, no wheezing, rhonchi or rales  Abd: soft, nontender, no hepatomegaly  Ext: no edema Musculoskeletal:  No deformities, BUE and BLE strength normal and equal Skin: warm and dry  Neuro: no focal abnormalities noted Psych:  Normal affect   EKG 04/01/23 2148 The EKG was personally reviewed and demonstrates: TWI in I, aVL, STE in V2, LVH, QTC 508   Prior EKG 03/28/22 LVH with repolarization abnormality in V2.    Relevant CV Studies: TTE 09/08/14 - Left ventricle: The cavity size was normal. Wall thickness was   normal. Systolic function was normal. The estimated ejection    fraction was in the range of 60% to 65%. Wall motion was normal;   there were no regional wall motion abnormalities. Doppler    parameters are consistent with abnormal left ventricular   relaxation (grade 1 diastolic dysfunction).  - Aortic valve: Mildly calcified annulus. Trileaflet.  - Mitral valve: Mildly calcified annulus. Mildly calcified leaflets   . There was mild regurgitation.  - Tricuspid valve: There was mild regurgitation.   NM Stress 05/2014  1. No reversible ischemia or infarction. 2. Normal left ventricular wall motion. 3. Left ventricular ejection fraction 57% 4. Low-risk stress test findings*.  Laboratory Data:   High Sensitivity Troponin:   Recent Labs  Lab 04/01/23 1654 04/01/23 1906  TROPONINIHS 746* 418*     Chemistry Recent Labs  Lab 04/01/23 1654 04/01/23 1709  NA 140 140  K 3.7 3.7  CL 106 106  CO2 24  --   GLUCOSE 111* 108*  BUN 21 24*  CREATININE 1.02* 1.10*  CALCIUM 8.7*  --   GFRNONAA 55*  --   ANIONGAP 10  --     Recent Labs  Lab 04/01/23 1654  PROT 5.9*  ALBUMIN  3.1*  AST 34  ALT 16  ALKPHOS 54  BILITOT 0.5   Lipids No results for input(s): CHOL, TRIG, HDL, LABVLDL, LDLCALC, CHOLHDL in the last 168 hours.  Hematology Recent Labs  Lab 04/01/23 1654 04/01/23 1709  WBC 11.4*  --   RBC 4.26  --   HGB 13.3 13.3  HCT 39.7  39.0  MCV 93.2  --   MCH 31.2  --   MCHC 33.5  --   RDW 13.3  --   PLT 215  --    Thyroid  No results for input(s): TSH, FREET4 in the last 168 hours.  BNPNo results for input(s): BNP, PROBNP in the last 168 hours.  DDimer No results for input(s): DDIMER in the last 168 hours.  Radiology/Studies:  CT Head Wo Contrast Result Date: 04/01/2023 CLINICAL DATA:  Syncope/presyncope, cerebrovascular cause suspected EXAM: CT HEAD WITHOUT CONTRAST TECHNIQUE: Contiguous axial images were obtained from the base of the skull through the vertex without intravenous contrast. RADIATION DOSE REDUCTION: This exam was performed according to the departmental dose-optimization program which includes automated exposure control, adjustment of the mA and/or kV according to patient size and/or use of iterative reconstruction technique. COMPARISON:  09/19/2022 FINDINGS: Brain: Age related volume loss. Chronic small-vessel ischemic changes the white matter. No sign of acute infarction, mass lesion, hemorrhage, hydrocephalus or extra-axial collection. Vascular: There is atherosclerotic calcification of the major vessels at the base of the brain. Skull: Negative Sinuses/Orbits: Clear/normal Other: None IMPRESSION: No acute CT finding. Age related volume loss. Chronic small-vessel ischemic changes of the white matter. Electronically Signed   By: Oneil Officer M.D.   On: 04/01/2023 20:02   CT Angio Chest PE W and/or Wo Contrast Result Date: 04/01/2023 CLINICAL DATA:  Syncope/presyncope, cerebrovascular cause suspected. Chest pain. Possible pulmonary embolism. EXAM: CT ANGIOGRAPHY CHEST WITH CONTRAST TECHNIQUE: Multidetector CT imaging of the chest was performed using the standard protocol during bolus administration of intravenous contrast. Multiplanar CT image reconstructions and MIPs were obtained to evaluate the vascular anatomy. RADIATION DOSE REDUCTION: This exam was performed according to the departmental  dose-optimization program which includes automated exposure control, adjustment of the mA and/or kV according to patient size and/or use of iterative reconstruction technique. CONTRAST:  65mL OMNIPAQUE  IOHEXOL  350 MG/ML SOLN COMPARISON:  12/09/2015 CT.  Chest radiography same day. FINDINGS: Cardiovascular: Heart size upper limits of normal. No right ventricular enlargement. No pericardial fluid. Some coronary artery calcification and aortic atherosclerotic calcification are present. Pulmonary arterial opacification is good. There are no pulmonary emboli. Mediastinum/Nodes: No mediastinal or hilar mass or lymphadenopathy. Lungs/Pleura: Mild dependent atelectasis. No pleural effusion. No acute pneumonia. No mass or nodule. Upper Abdomen: No acute or significant finding. Musculoskeletal: Previous thoracolumbar fusion. Some degenerative change of the disc levels above fusion. No evidence of regional  acute fracture. Review of the MIP images confirms the above findings. IMPRESSION: 1. No pulmonary emboli. 2. Coronary artery calcification. Aortic atherosclerotic calcification. 3. Previous thoracolumbar fusion. Some degenerative change of the disc levels above fusion. Electronically Signed   By: Oneil Officer M.D.   On: 04/01/2023 20:00   DG Chest Port 1 View Result Date: 04/01/2023 CLINICAL DATA:  Chest pain EXAM: PORTABLE CHEST 1 VIEW COMPARISON:  02/17/2020 FINDINGS: Stable cardiomediastinal silhouette. Aortic atherosclerotic calcification. Chronic bronchitic changes are similar to prior. No focal pneumonia, pleural effusion, or pneumothorax. Remote left rib fractures. Cervical and thoracolumbar fusion hardware. IMPRESSION: No active disease. Electronically Signed   By: Norman Gatlin M.D.   On: 04/01/2023 17:30   Assessment and Plan:   #CAD #Atypical Chest pain #NSTEMI #HLD Patient with a prior history of HTN, HLD, mild dementia presenting with chest pain; history of her chest pain is otherwise limited, though  she does report new chest pain and shortness of breath on exertion for the past week with an acute episode today. Her chest pain is reproducible on exam, which may be consistent with costochondritis. However, she did have a troponin peak at 746 with EKG demonstrating new TWI in I and aVL compared to her prior on 03/28/22. CTPE was negative for PE but did demonstrate evidence of CAD. Prior nuclear stress test in 2016 was negative. Given minimal chest pain currently and downtrending troponins, no significant ST changes on EKG, low-suspicion for active ACS. TIMI risk score is 3. Would recommend obtaining baseline TTE, lipid panel, A1c, TSH. Will consider further ischemic evaluation with LHC vs nuclear stress test while inpatient. --continue asa 81mg  qd --continue ezetimibe  10mg  every day (hx statin allergy) --obtain TTE  --Repeat lipid panel, A1c, TSH --EKG, nitroglycerin  prn chest pain --NPO at MN for consideration of LHC vs non-invasive ischemic evaluation  --trial lidocaine  patch, acetaminophen  for possible MSK etiology of chest pain  --maintain telemetry, Goal K~4, Mag ~2   Risk Assessment/Risk Scores:     TIMI Risk Score for Unstable Angina or Non-ST Elevation MI:   The patient's TIMI risk score is 3, which indicates a 13% risk of all cause mortality, new or recurrent myocardial infarction or need for urgent revascularization in the next 14 days.  For questions or updates, please contact [facility] Please consult [email or web address] for contact info under   Earlene CHRISTELLA Cluster, MD  04/01/2023 11:31 PM

## 2023-04-01 NOTE — ED Provider Notes (Signed)
 [place] [facility]MENT AT [facility] Provider Note   CSN: [id number] Arrival date & time: 04/01/23  1633     History  Chief Complaint  Patient presents with   Chest Pain    Kaitlyn Serrano is a 83 y.o. female.  83 year old female with prior medical history as detailed below presents for evaluation.  Patient reports that she was at home.  She bent over a chair and that she stood up she felt lightheaded.  She experienced associated right-sided chest discomfort at the same time.  Patient reports that her symptoms are significant improved on arrival.  Patient with mild dementia.  EMS was concerned about EKG findings and apparently discussed EKG with cardiology as a possible STEMI.  Per EMS, EKG was not consistent with STEMI after being checked by providers at [facility].  The history is provided by the patient, medical records and the EMS personnel.       Home Medications Prior to Admission medications   Medication Sig Start Date End Date Taking? Authorizing Provider  atenolol  (TENORMIN ) 25 MG tablet Take 1 tablet (25 mg total) by mouth daily. 04/12/22   [name], DO  busPIRone  (BUSPAR ) 10 MG tablet Take 2 tablets (20 mg total) by mouth 2 (two) times daily. 06/24/22   [name], DO  celecoxib  (CELEBREX ) 200 MG capsule Take 1 capsule (200 mg total) by mouth daily. 02/07/23   [name], DO  conjugated estrogens  (PREMARIN ) vaginal cream Place 1 Applicatorful vaginally daily. 01/16/23   [facility] Pool, NP  cyclobenzaprine  (FLEXERIL ) 10 MG tablet Take 10 mg by mouth 3 (three) times daily as needed for muscle spasms.    [provider]  diltiazem  (TIADYLT  ER) 120 MG 24 hr capsule TAKE 1 CAPSULE BY MOUTH EVERY DAY 04/12/22   Jolinda Norene M, DO  docusate sodium  (COLACE) 100 MG capsule Take 1 capsule (100 mg total) by mouth 2 (two) times daily. 06/09/19   Danella Cough, PA-C  donepezil  (ARICEPT ) 10 MG tablet Take 1  tablet (10 mg total) by mouth at bedtime. For dementia 02/07/23   Jolinda Norene M, DO  ezetimibe  (ZETIA ) 10 MG tablet TAKE ONE TABLET BY MOUTH DAILY AT 9AM 02/24/23   Alvan Dorn FALCON, MD  hydrALAZINE  (APRESOLINE ) 25 MG tablet TAKE 1 TABLET BY MOUTH TWICE A DAY 04/02/21   Alvan Dorn FALCON, MD  HYDROcodone -acetaminophen  (NORCO) 10-325 MG tablet Take 1 tablet by mouth every 4 (four) hours as needed for severe pain.    [provider]  ipratropium (ATROVENT ) 0.06 % nasal spray Place 1 spray into both nostrils 2 (two) times daily as needed for rhinitis. 02/26/21   [provider]  levofloxacin  (LEVAQUIN ) 500 MG tablet Take 1 tablet (500 mg total) by mouth daily. 02/11/23   Zollie Lowers, MD  lisinopril  (ZESTRIL ) 40 MG tablet Take 1 tablet (40 mg total) by mouth daily. 04/12/22   [name], DO  loratadine  (CLARITIN ) 10 MG tablet Take 1 tablet by mouth once daily 12/25/21   Jolinda Norene M, DO  metroNIDAZOLE  (FLAGYL ) 500 MG tablet Take 1 tablet (500 mg total) by mouth 2 (two) times daily. 01/16/23   [facility] Pool, NP  mirtazapine  (REMERON ) 15 MG tablet Take 1 tablet (15 mg total) by mouth at bedtime. For anxiety/ appetite/ sleep 06/24/22   [name], DO  pantoprazole  (PROTONIX ) 40 MG tablet Take 1 tablet (40 mg total) by mouth 2 (two) times daily. 02/05/23  Jolinda Potter M, DO  polyethylene glycol (MIRALAX  / GLYCOLAX ) 17 g packet Take 17 g by mouth 2 (two) times daily. 05/15/22   Patti Rosina SAUNDERS, PA-C  promethazine -dextromethorphan (PROMETHAZINE -DM) 6.25-15 MG/5ML syrup Take 5 mLs by mouth 4 (four) times daily as needed for cough. 02/11/23   Zollie Lowers, MD  tiZANidine (ZANAFLEX) 2 MG tablet Take 2 mg by mouth 2 (two) times daily.    [provider]      Allergies    Bee venom, Hydrocodone , Penicillins, Amlodipine , Elavil  [amitriptyline  hcl], and Statins    Review of Systems   Review of Systems  All other systems reviewed and  are negative.   Physical Exam Updated Vital Signs BP 137/73   Pulse 71   Resp 19   Ht 5' 2 (1.575 m)   Wt 56.7 kg   SpO2 98%   BMI 22.86 kg/m  Physical Exam Vitals and nursing note reviewed.  Constitutional:      General: She is not in acute distress.    Appearance: Normal appearance. She is well-developed.  HENT:     Head: Normocephalic and atraumatic.  Eyes:     Conjunctiva/sclera: Conjunctivae normal.     Pupils: Pupils are equal, round, and reactive to light.  Cardiovascular:     Rate and Rhythm: Normal rate and regular rhythm.     Heart sounds: Normal heart sounds.  Pulmonary:     Effort: Pulmonary effort is normal. No respiratory distress.     Breath sounds: Normal breath sounds.  Abdominal:     General: There is no distension.     Palpations: Abdomen is soft.     Tenderness: There is no abdominal tenderness.  Musculoskeletal:        General: No deformity. Normal range of motion.     Cervical back: Normal range of motion and neck supple.  Skin:    General: Skin is warm and dry.  Neurological:     General: No focal deficit present.     Mental Status: She is alert and oriented to person, place, and time.     ED Results / Procedures / Treatments   Labs (all labs ordered are listed, but only abnormal results are displayed) Labs Reviewed  CBC WITH DIFFERENTIAL/PLATELET  COMPREHENSIVE METABOLIC PANEL  I-STAT CHEM 8, ED  TROPONIN I (HIGH SENSITIVITY)    EKG EKG Interpretation Date/Time:  Tuesday April 01 2023 16:40:10 EST Ventricular Rate:  70 PR Interval:  194 QRS Duration:  96 QT Interval:  461 QTC Calculation: 498 R Axis:   -53  Text Interpretation: Sinus rhythm Left anterior fascicular block Left ventricular hypertrophy Anterior Q waves, possibly due to LVH Abnormal T, consider ischemia, lateral leads Confirmed by Laurice Coy [phone]) on 04/01/2023 4:42:26 PM  Radiology No results found.  Procedures Procedures    Medications Ordered in  ED Medications - No data to display  ED Course/ Medical Decision Making/ A&P                                 Medical Decision Making Amount and/or Complexity of Data Reviewed Labs: ordered. Radiology: ordered.  Risk OTC drugs. Prescription drug management. Decision regarding hospitalization.    Medical Screen Complete  This patient presented to the ED with complaint of chest pain.  This complaint involves an extensive number of treatment options. The initial differential diagnosis includes, but is not limited to, ACS, PE, metabolic abnormality, etc.  This presentation is: Acute, Self-Limited, Previously Undiagnosed, Uncertain Prognosis, Complicated, Systemic Symptoms, and Threat to Life/Bodily Function  Patient presents with atypical chest discomfort.  Initial EKG is without evidence of acute ischemia.  Initial troponin is elevated but downward trending on repeat.  CT imaging does not reveal evidence of PE.  Case discussed with cardiology.  Cardiology will consult.  No indication for heparinization at this time given resolution of symptoms and downward trending troponin.  Hospitalist service is aware of case and will evaluate for admission.  Additional history obtained: External records from outside sources obtained and reviewed including prior ED visits and prior Inpatient records.    Lab Tests:  I ordered and personally interpreted labs.  The pertinent results include: CBC, CMP, troponin, i-STAT Chem-8   Imaging Studies ordered:  I ordered imaging studies including CT head, CT angio chest, chest x-ray I independently visualized and interpreted obtained imaging which showed NAD I agree with the radiologist interpretation.   Cardiac Monitoring:  The patient was maintained on a cardiac monitor.  I personally viewed and interpreted the cardiac monitor which showed an underlying rhythm of: NSR   Problem List / ED Course:  Atypical chest pain, elevated  troponin   Reevaluation:  After the interventions noted above, I reevaluated the patient and found that they have: improved  Disposition:  After consideration of the diagnostic results and the patients response to treatment, I feel that the patent would benefit from admission.          Final Clinical Impression(s) / ED Diagnoses Final diagnoses:  Atypical chest pain  Elevated troponin    Rx / DC Orders ED Discharge Orders     None         Laurice Maude BROCKS, MD 04/01/23 2318

## 2023-04-01 NOTE — H&P (Signed)
 History and Physical    [name] FMW:[id number] DOB: 06-25-1940 DOA: 04/01/2023  PCP: [name], DO  Patient coming from: Home  Chief Complaint: Chest pain  HPI: Kaitlyn Serrano is a 83 y.o. female with medical history significant of hypertension, hyperlipidemia, GERD, anxiety, depression, chronic back pain, mild dementia presented to the ED via EMS for evaluation of right-sided chest pain and near syncope.  Patient took 4 baby aspirin 's prior to arrival.  EKG showing new ST elevations in V2 and T wave inversions in leads I and aVL.  Cardiology was consulted and reviewed the EKG and felt that it was not consistent with STEMI.  Vital signs stable.  Labs notable for WBC count 11.4, creatinine 1.0 (baseline 0.6-0.7), troponin 746> 418.  CTA chest negative for PE but showing coronary artery calcification.  CT head negative for acute intracranial abnormality. Cardiology recommended holding off starting IV heparin  since troponin had trended down.  [facility] called to admit.  Patient states in the afternoon she was watching TV and then got up to walk to her bedroom when all of a sudden she started feeling dizzy/lightheaded and felt like she was going to pass out.  She denies experiencing any chest pain/pressure/discomfort throughout the day or now.  Denies shortness of breath.  No longer feeling dizzy/lightheaded.  Resting comfortably and watching television.  She has no other complaints.  Review of Systems:  Review of Systems  All other systems reviewed and are negative.   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 family unaware   Dysphagia    Dyspnea    Generalized weakness    GERD (gastroesophageal reflux disease)    GERD (gastroesophageal reflux disease)    High cholesterol    Hoarseness    Hx of  cardiovascular stress test    negative 2012 and 2016   Hypertension    Insomnia    Intervertebral disc disorder with myelopathy, lumbar region    Lumbago    Lumbar spine pain    PONV (postoperative nausea and vomiting)    confusion after anesthesia   Renal insufficiency    Ringing in ears    resolved   Spondylosis with myelopathy, lumbar region    legs, back    Past Surgical History:  Procedure Laterality Date   ABDOMINAL HYSTERECTOMY     BACK SURGERY     BONE MARROW ASPIRATION     CERVICAL SPINE SURGERY     EYE SURGERY Bilateral    Lasic   FIXATION KYPHOPLASTY     LAMINOTOMY     SPINAL FUSION     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 Her smoking use included cigarettes. She started smoking about 62 years ago. She has a 14.8 pack-year smoking history. She has never used smokeless tobacco. She reports that she does not drink alcohol and does not use drugs.  Allergies  Allergen  Reactions   Bee Venom Anaphylaxis   Hydrocodone  Swelling   Penicillins Anaphylaxis and Rash    Has patient had a PCN reaction causing immediate rash, facial/tongue/throat swelling, SOB or lightheadedness with hypotension: Yes Has patient had a PCN reaction causing severe rash involving mucus membranes or skin necrosis: Yes Has patient had a PCN reaction that required hospitalization: Yes Has patient had a PCN reaction occurring within the last 10 years: No If all of the above answers are NO, then may proceed with Cephalosporin use.    Amlodipine  Swelling   Elavil  [Amitriptyline  Hcl] Other (See Comments)     Confusion, hallucinations   Statins Swelling and Other (See Comments)    Peeling of skin    Family History  Problem Relation Age of Onset   Diabetes Mother    Heart disease Mother    Hypertension Mother    Gallbladder disease Mother    Heart disease Father    Pneumonia Sister    Breast cancer Paternal Grandmother    Hypertension Brother    Hyperlipidemia Brother    Hypertension Child    Gallbladder disease Other     Prior to Admission medications   Medication Sig Start Date End Date Taking? Authorizing Provider  aspirin  EC 81 MG tablet Take 81 mg by mouth daily. Swallow whole.   Yes [provider]  celecoxib  (CELEBREX ) 200 MG capsule Take 1 capsule (200 mg total) by mouth daily. 02/07/23  Yes Gottschalk, Norene M, DO  cyclobenzaprine  (FLEXERIL ) 10 MG tablet Take 10 mg by mouth 3 (three) times daily as needed for muscle spasms.   Yes [provider]  ezetimibe  (ZETIA ) 10 MG tablet TAKE ONE TABLET BY MOUTH DAILY AT 9AM 02/24/23  Yes Branch, Dorn FALCON, MD  levofloxacin  (LEVAQUIN ) 500 MG tablet Take 1 tablet (500 mg total) by mouth daily. 02/11/23  Yes Zollie Lowers, MD  pantoprazole  (PROTONIX ) 40 MG tablet Take 1 tablet (40 mg total) by mouth 2 (two) times daily. 02/05/23  Yes Gottschalk, Norene M, DO  polyethylene glycol (MIRALAX  / GLYCOLAX ) 17 g packet Take 17 g by mouth 2 (two) times daily. Patient taking differently: Take 17 g by mouth daily as needed for mild constipation. 05/15/22  Yes Patti Rosina SAUNDERS, PA-C  traZODone  (DESYREL ) 50 MG tablet Take 50 mg by mouth at bedtime as needed for sleep.   Yes [provider]  atenolol  (TENORMIN ) 25 MG tablet Take 1 tablet (25 mg total) by mouth daily. 04/12/22   [name], DO  busPIRone  (BUSPAR ) 10 MG tablet Take 2 tablets (20 mg total) by mouth 2 (two) times daily. 06/24/22   [name], DO  conjugated estrogens  (PREMARIN ) vaginal cream Place 1 Applicatorful vaginally daily. 01/16/23   [facility] [place] Sebastian Pool, NP  diltiazem  (TIADYLT  ER) 120 MG 24 hr capsule TAKE 1 CAPSULE BY MOUTH EVERY DAY 04/12/22   Jolinda Norene M, DO  docusate sodium  (COLACE) 100 MG capsule Take 1 capsule (100 mg total) by mouth 2 (two) times daily. 06/09/19   Danella Cough, PA-C  donepezil  (ARICEPT ) 10 MG tablet Take 1 tablet (10 mg total) by mouth at bedtime. For dementia 02/07/23   Jolinda Norene M, DO  hydrALAZINE  (APRESOLINE ) 25 MG tablet TAKE 1 TABLET BY MOUTH TWICE A DAY 04/02/21   Alvan Dorn FALCON, MD  HYDROcodone -acetaminophen  (NORCO) 10-325 MG tablet Take 1 tablet by mouth every 4 (four) hours as needed for severe pain.    [provider]  ipratropium (ATROVENT ) 0.06 %  nasal spray Place 1 spray into both nostrils 2 (two) times daily as needed for rhinitis. 02/26/21   [provider]  lisinopril  (ZESTRIL ) 40 MG tablet Take 1 tablet (40 mg total) by mouth daily. 04/12/22   [name], DO  loratadine  (CLARITIN ) 10 MG tablet Take 1 tablet by mouth once daily 12/25/21   Jolinda Norene M, DO  metroNIDAZOLE  (FLAGYL ) 500 MG tablet Take 1 tablet (500 mg total) by mouth 2 (two) times daily. Patient not taking: Reported on 04/01/2023 01/16/23   Deitra Morton Sebastian Nena, NP  mirtazapine  (REMERON ) 15 MG tablet Take 1 tablet (15 mg total) by mouth at bedtime. For anxiety/ appetite/ sleep 06/24/22   [name], DO  promethazine -dextromethorphan (PROMETHAZINE -DM) 6.25-15 MG/5ML syrup Take 5 mLs by mouth 4 (four) times daily as needed for cough. Patient not taking: Reported on 04/01/2023 02/11/23   Zollie Lowers, MD  tiZANidine (ZANAFLEX) 2 MG tablet Take 2 mg by mouth 2 (two) times daily.    [provider]    Physical Exam: Vitals:   04/01/23 1745 04/01/23 1800 04/01/23 1815 04/01/23 2051  BP: (!) 123/57 123/62 120/78   Pulse: 71 71 72   Resp: 14 17 15    Temp:    98.6 F (37 C)  TempSrc:    Oral  SpO2: 95% 95% 97%   Weight:      Height:        Physical  Exam Vitals reviewed.  Constitutional:      General: She is not in acute distress. HENT:     Head: Normocephalic and atraumatic.  Eyes:     Extraocular Movements: Extraocular movements intact.  Cardiovascular:     Rate and Rhythm: Normal rate and regular rhythm.     Pulses: Normal pulses.  Pulmonary:     Effort: Pulmonary effort is normal. No respiratory distress.     Breath sounds: Normal breath sounds. No wheezing or rales.  Abdominal:     General: Bowel sounds are normal. There is no distension.     Palpations: Abdomen is soft.     Tenderness: There is no abdominal tenderness. There is no guarding.  Musculoskeletal:     Cervical back: Normal range of motion.     Right lower leg: No edema.     Left lower leg: No edema.  Skin:    General: Skin is warm and dry.  Neurological:     General: No focal deficit present.     Mental Status: She is alert and oriented to person, place, and time.     Labs on Admission: I have personally reviewed following labs and imaging studies  CBC: Recent Labs  Lab 04/01/23 1654 04/01/23 1709  WBC 11.4*  --   NEUTROABS 8.9*  --   HGB 13.3 13.3  HCT 39.7 39.0  MCV 93.2  --   PLT 215  --    Basic Metabolic Panel: Recent Labs  Lab 04/01/23 1654 04/01/23 1709  NA 140 140  K 3.7 3.7  CL 106 106  CO2 24  --   GLUCOSE 111* 108*  BUN 21 24*  CREATININE 1.02* 1.10*  CALCIUM 8.7*  --    GFR: Estimated Creatinine Clearance: 31.2 mL/min (A) (by C-G formula based on SCr of 1.1 mg/dL (H)). Liver Function Tests: Recent Labs  Lab 04/01/23 1654  AST 34  ALT 16  ALKPHOS 54  BILITOT 0.5  PROT 5.9*  ALBUMIN  3.1*   No results for input(s): LIPASE, AMYLASE in  the last 168 hours. No results for input(s): AMMONIA in the last 168 hours. Coagulation Profile: No results for input(s): INR, PROTIME in the last 168 hours. Cardiac Enzymes: No results for input(s): CKTOTAL, CKMB, CKMBINDEX, TROPONINI in the last 168 hours. BNP  (last 3 results) No results for input(s): PROBNP in the last 8760 hours. HbA1C: No results for input(s): HGBA1C in the last 72 hours. CBG: No results for input(s): GLUCAP in the last 168 hours. Lipid Profile: No results for input(s): CHOL, HDL, LDLCALC, TRIG, CHOLHDL, LDLDIRECT in the last 72 hours. Thyroid  Function Tests: No results for input(s): TSH, T4TOTAL, FREET4, T3FREE, THYROIDAB in the last 72 hours. Anemia Panel: No results for input(s): VITAMINB12, FOLATE, FERRITIN, TIBC, IRON, RETICCTPCT in the last 72 hours. Urine analysis:    Component Value Date/Time   COLORURINE YELLOW 01/09/2015 1013   APPEARANCEUR Clear 01/16/2023 1424   LABSPEC 1.010 01/09/2015 1013   PHURINE 6.5 01/09/2015 1013   GLUCOSEU Negative 01/16/2023 1424   GLUCOSEU NEGATIVE 01/09/2015 1013   HGBUR NEGATIVE 01/09/2015 1013   BILIRUBINUR Negative 01/16/2023 1424   KETONESUR NEGATIVE 01/09/2015 1013   PROTEINUR Negative 01/16/2023 1424   PROTEINUR NEGATIVE 07/26/2014 1517   UROBILINOGEN 0.2 01/09/2015 1013   NITRITE Negative 01/16/2023 1424   NITRITE NEGATIVE 01/09/2015 1013   LEUKOCYTESUR Negative 01/16/2023 1424    Radiological Exams on Admission: CT Head Wo Contrast Result Date: 04/01/2023 CLINICAL DATA:  Syncope/presyncope, cerebrovascular cause suspected EXAM: CT HEAD WITHOUT CONTRAST TECHNIQUE: Contiguous axial images were obtained from the base of the skull through the vertex without intravenous contrast. RADIATION DOSE REDUCTION: This exam was performed according to the departmental dose-optimization program which includes automated exposure control, adjustment of the mA and/or kV according to patient size and/or use of iterative reconstruction technique. COMPARISON:  09/19/2022 FINDINGS: Brain: Age related volume loss. Chronic small-vessel ischemic changes the white matter. No sign of acute infarction, mass lesion, hemorrhage, hydrocephalus or extra-axial  collection. Vascular: There is atherosclerotic calcification of the major vessels at the base of the brain. Skull: Negative Sinuses/Orbits: Clear/normal Other: None IMPRESSION: No acute CT finding. Age related volume loss. Chronic small-vessel ischemic changes of the white matter. Electronically Signed   By: Oneil Officer M.D.   On: 04/01/2023 20:02   CT Angio Chest PE W and/or Wo Contrast Result Date: 04/01/2023 CLINICAL DATA:  Syncope/presyncope, cerebrovascular cause suspected. Chest pain. Possible pulmonary embolism. EXAM: CT ANGIOGRAPHY CHEST WITH CONTRAST TECHNIQUE: Multidetector CT imaging of the chest was performed using the standard protocol during bolus administration of intravenous contrast. Multiplanar CT image reconstructions and MIPs were obtained to evaluate the vascular anatomy. RADIATION DOSE REDUCTION: This exam was performed according to the departmental dose-optimization program which includes automated exposure control, adjustment of the mA and/or kV according to patient size and/or use of iterative reconstruction technique. CONTRAST:  65mL OMNIPAQUE  IOHEXOL  350 MG/ML SOLN COMPARISON:  12/09/2015 CT.  Chest radiography same day. FINDINGS: Cardiovascular: Heart size upper limits of normal. No right ventricular enlargement. No pericardial fluid. Some coronary artery calcification and aortic atherosclerotic calcification are present. Pulmonary arterial opacification is good. There are no pulmonary emboli. Mediastinum/Nodes: No mediastinal or hilar mass or lymphadenopathy. Lungs/Pleura: Mild dependent atelectasis. No pleural effusion. No acute pneumonia. No mass or nodule. Upper Abdomen: No acute or significant finding. Musculoskeletal: Previous thoracolumbar fusion. Some degenerative change of the disc levels above fusion. No evidence of regional acute fracture. Review of the MIP images confirms the above findings. IMPRESSION: 1. No pulmonary emboli. 2. Coronary  artery calcification. Aortic  atherosclerotic calcification. 3. Previous thoracolumbar fusion. Some degenerative change of the disc levels above fusion. Electronically Signed   By: Oneil Officer M.D.   On: 04/01/2023 20:00   DG Chest Port 1 View Result Date: 04/01/2023 CLINICAL DATA:  Chest pain EXAM: PORTABLE CHEST 1 VIEW COMPARISON:  02/17/2020 FINDINGS: Stable cardiomediastinal silhouette. Aortic atherosclerotic calcification. Chronic bronchitic changes are similar to prior. No focal pneumonia, pleural effusion, or pneumothorax. Remote left rib fractures. Cervical and thoracolumbar fusion hardware. IMPRESSION: No active disease. Electronically Signed   By: Norman Gatlin M.D.   On: 04/01/2023 17:30    EKG: Independently reviewed.  Sinus rhythm, LAFB, LVH, QTc 508.  New ST elevations in V2 and T wave inversions in leads 1 and aVL.  Assessment and Plan  Near syncope, ?chest pain Patient presented to the ED via EMS for evaluation of right-sided chest pain and near syncope.  She took 4 baby aspirin 's prior to arrival.  EKG showing new ST elevations in V2 and T wave inversions in leads I and aVL.  Cardiology was consulted and reviewed the EKG and felt that it was not consistent with STEMI.  Troponin elevated but trended down (746> 418) and not consistent with ACS.  Cardiology recommended holding off starting IV heparin .  CTA chest negative for PE but showing coronary artery calcification.  CT head was also done for evaluation of near syncope and is negative for acute intracranial abnormality.  She has no focal neurodeficit on exam.  At the time of my evaluation, patient denies experiencing any chest pain/pressure/discomfort.  Continue cardiac monitoring and echocardiogram ordered.  Fall precautions.  Mild AKI Creatinine currently 1.0 and baseline appears to be 0.6-0.7.  Gentle IV fluid hydration and monitor renal function.  Avoid nephrotoxic agents/hold home lisinopril .  QT prolongation Monitor potassium and magnesium  levels,  replace as needed.  Avoid QT prolonging drugs and follow-up repeat EKG in the morning.  Hypertension Hold antihypertensives at this time and check orthostatics.  Hyperlipidemia GERD Anxiety and depression Chronic back pain Dementia: Delirium precautions. Pharmacy med rec pending.  DVT prophylaxis: Lovenox  Code Status: Full Code (discussed with the patient) Family Communication: No family available at this time. Consults called: Cardiology Level of care: Progressive Care Unit Admission status: It is my clinical opinion that referral for OBSERVATION is reasonable and necessary in this patient based on the above information provided. The aforementioned taken together are felt to place the patient at high risk for further clinical deterioration. However, it is anticipated that the patient may be medically stable for discharge from the hospital within 24 to 48 hours.  Editha Ram MD [facility]  If 7PM-7AM, please contact night-coverage [email or web address]  04/01/2023, 11:19 PM

## 2023-04-02 ENCOUNTER — Observation Stay (HOSPITAL_COMMUNITY): Payer: Medicare HMO

## 2023-04-02 DIAGNOSIS — E782 Mixed hyperlipidemia: Secondary | ICD-10-CM | POA: Diagnosis not present

## 2023-04-02 DIAGNOSIS — I5181 Takotsubo syndrome: Secondary | ICD-10-CM | POA: Diagnosis not present

## 2023-04-02 DIAGNOSIS — R55 Syncope and collapse: Secondary | ICD-10-CM | POA: Diagnosis not present

## 2023-04-02 DIAGNOSIS — I10 Essential (primary) hypertension: Secondary | ICD-10-CM

## 2023-04-02 DIAGNOSIS — R9431 Abnormal electrocardiogram [ECG] [EKG]: Secondary | ICD-10-CM | POA: Diagnosis not present

## 2023-04-02 DIAGNOSIS — K219 Gastro-esophageal reflux disease without esophagitis: Secondary | ICD-10-CM | POA: Diagnosis not present

## 2023-04-02 DIAGNOSIS — R7989 Other specified abnormal findings of blood chemistry: Secondary | ICD-10-CM | POA: Diagnosis not present

## 2023-04-02 DIAGNOSIS — N179 Acute kidney failure, unspecified: Secondary | ICD-10-CM | POA: Diagnosis not present

## 2023-04-02 DIAGNOSIS — I214 Non-ST elevation (NSTEMI) myocardial infarction: Secondary | ICD-10-CM | POA: Diagnosis not present

## 2023-04-02 DIAGNOSIS — I5021 Acute systolic (congestive) heart failure: Secondary | ICD-10-CM | POA: Diagnosis not present

## 2023-04-02 DIAGNOSIS — R079 Chest pain, unspecified: Secondary | ICD-10-CM | POA: Diagnosis not present

## 2023-04-02 DIAGNOSIS — R42 Dizziness and giddiness: Secondary | ICD-10-CM | POA: Diagnosis not present

## 2023-04-02 LAB — CBC
HCT: 35.7 % — ABNORMAL LOW (ref 36.0–46.0)
Hemoglobin: 11.8 g/dL — ABNORMAL LOW (ref 12.0–15.0)
MCH: 30.7 pg (ref 26.0–34.0)
MCHC: 33.1 g/dL (ref 30.0–36.0)
MCV: 93 fL (ref 80.0–100.0)
Platelets: 190 10*3/uL (ref 150–400)
RBC: 3.84 MIL/uL — ABNORMAL LOW (ref 3.87–5.11)
RDW: 13.4 % (ref 11.5–15.5)
WBC: 10.7 10*3/uL — ABNORMAL HIGH (ref 4.0–10.5)
nRBC: 0 % (ref 0.0–0.2)

## 2023-04-02 LAB — BASIC METABOLIC PANEL
Anion gap: 9 (ref 5–15)
BUN: 17 mg/dL (ref 8–23)
CO2: 22 mmol/L (ref 22–32)
Calcium: 8.8 mg/dL — ABNORMAL LOW (ref 8.9–10.3)
Chloride: 107 mmol/L (ref 98–111)
Creatinine, Ser: 0.7 mg/dL (ref 0.44–1.00)
GFR, Estimated: >60 mL/min (ref >60)
Glucose, Bld: 93 mg/dL (ref 70–99)
Potassium: 3.6 mmol/L (ref 3.5–5.1)
Sodium: 138 mmol/L (ref 135–145)

## 2023-04-02 LAB — MAGNESIUM: Magnesium: 1.8 mg/dL (ref 1.7–2.4)

## 2023-04-02 MED ORDER — DONEPEZIL HCL 10 MG PO TABS
10.0000 mg | ORAL_TABLET | Freq: Every day | ORAL | Status: DC
  Administered 2023-04-02 – 2023-04-03 (×2): 10 mg via ORAL
  Filled 2023-04-02 (×3): qty 1

## 2023-04-02 MED ORDER — BUSPIRONE HCL 5 MG PO TABS
20.0000 mg | ORAL_TABLET | Freq: Two times a day (BID) | ORAL | Status: DC
  Administered 2023-04-02 – 2023-04-05 (×7): 20 mg via ORAL
  Filled 2023-04-02 (×3): qty 2
  Filled 2023-04-02 (×4): qty 4

## 2023-04-02 MED ORDER — MAGNESIUM SULFATE 2 GM/50ML IV SOLN
2.0000 g | Freq: Once | INTRAVENOUS | Status: AC
  Administered 2023-04-02: 2 g via INTRAVENOUS
  Filled 2023-04-02: qty 50

## 2023-04-02 MED ORDER — MIRTAZAPINE 15 MG PO TABS
15.0000 mg | ORAL_TABLET | Freq: Every day | ORAL | Status: DC
  Administered 2023-04-02 – 2023-04-04 (×3): 15 mg via ORAL
  Filled 2023-04-02 (×4): qty 1

## 2023-04-02 MED ORDER — HYDROCODONE-ACETAMINOPHEN 10-325 MG PO TABS
1.0000 | ORAL_TABLET | ORAL | Status: DC | PRN
  Administered 2023-04-02 – 2023-04-05 (×6): 1 via ORAL
  Filled 2023-04-02 (×6): qty 1

## 2023-04-02 MED ORDER — EZETIMIBE 10 MG PO TABS
10.0000 mg | ORAL_TABLET | Freq: Every day | ORAL | Status: DC
Start: 2023-04-02 — End: 2023-04-05
  Administered 2023-04-02 – 2023-04-05 (×4): 10 mg via ORAL
  Filled 2023-04-02 (×3): qty 1

## 2023-04-02 MED ORDER — PANTOPRAZOLE SODIUM 40 MG PO TBEC
40.0000 mg | DELAYED_RELEASE_TABLET | Freq: Two times a day (BID) | ORAL | Status: DC
Start: 2023-04-02 — End: 2023-04-05
  Administered 2023-04-02 – 2023-04-05 (×7): 40 mg via ORAL
  Filled 2023-04-02 (×7): qty 1

## 2023-04-02 NOTE — Progress Notes (Signed)
  Echocardiogram 2D Echocardiogram has been attempted. Pt unavailable  Fain Home RDCS 04/02/2023, 2:19 PM

## 2023-04-02 NOTE — Progress Notes (Signed)
 PROGRESS NOTE    Kaitlyn Serrano  FMW:[id number] DOB: 17-Apr-1940 DOA: 04/01/2023 PCP: [name], DO   Brief Narrative:  The patient is an 83 year old elderly Caucasian female past medical history significant for Mann[place] to hypertension, hyperlipidemia, GERD, anxiety, depression, chronic back pain, mild dementia as well as other comorbidities who presented to the ED via EMS for evaluation of right-sided chest discomfort and near syncope.  She took 4 baby aspirin 's prior to arrival and EKG on admission showed new ST elevations V2 and T wave inversion in lead I and aVL.  Cardiology was consulted and reviewed the EKG and felt is not consistent with a STEMI.  She states that she was watching television afternoon and got up to walk to the bedroom with all of a sudden she started feeling dizzy and lightheaded and felt like she is in a pass out.  She denied experiencing any chest pain, pressure, discomfort throughout the day or now and denies shortness of breath but has been having intermittent shortness breath and chest discomfort over the last week or so.  Currently she is resting comfortably.  Given her concerns she presented to the ED and she had a CTA of the chest was negative for PE but did show coronary artery calcification.  CT of the head was negative for any acute abnormalities.  Cardiology was consulted and recommended holding of IV heparin  given her troponin is trending down and currently she is undergoing further workup and echocardiogram is ordered and pending orthostatic vital signs and PT and OT evaluation.  Assessment and Plan:  Atypical Chest Discomfort r/o ACS Elevated Troponin -Presented to the ED via EMS for Right sided CP and Near Syncope -Took 4 baby ASA prior to Arrival and will contine ASA 81 mg po Daily  -EKG showing new ST elevations in V2 and T wave inversions in leads I and aVL.  -Cardiology was consulted and reviewed the EKG and felt that it was not consistent with  STEMI. -Troponin Trending down and went from 746 -> 418 -Cardiology recommending Holding Heparin  gtt at this time -CTA done and showed No pulmonary emboli. Coronary artery calcification. Aortic atherosclerotic calcification. Previous thoracolumbar fusion. Some degenerative change of the disc levels above fusion -Cardiology feels she could have had a Cardiac event with her intermittent CP and SOB that started a week ago and recommending ECHO and then deciding on what type of ischemic workup is indicated   Near Syncope -Patient presented to the ED via EMS for evaluation of right-sided chest pain and near syncope.  She took 4 baby aspirin 's prior to arrival.  Troponin elevated but trended down (746> 418) and not consistent with ACS.   -Cardiology recommended holding off starting IV heparin .   -WBC Trend: Recent Labs  Lab 04/01/23 1654 04/02/23 0425  WBC 11.4* 10.7*  -CTA chest negative for PE but showing coronary artery calcification.   -CT head was also done for evaluation of near syncope and is negative for acute intracranial abnormality.   -She has no focal neurodeficit on exam and currently patient denies experiencing any chest pain/pressure/discomfort.   -Continue cardiac monitoring and echocardiogram ordered and pending.   -Fall precautions. -Check Orthostatic VS and is currently getting IVF Hydration with NS at 75 mL/hr -ECHOCardiogram pending  -Cardiology considering outpatient Monitor if Orthostatic VS Negative    AKI -Mild and improved. Creatinine baseline appears to be 0.6-0.7.   -BUN/Cr Trend: Recent Labs  Lab 04/01/23 1654 04/01/23 1709 04/02/23 0425  BUN 21  24* 17  CREATININE 1.02* 1.10* 0.70  -C/w Gentle IV fluid hydration with NS at 75 mL/hr x12 hours and monitor renal function.  -Avoid Nephrotoxic Medications and currently holding home Lisinopril , Contrast Dyes, Hypotension and Dehydration to Ensure Adequate Renal Perfusion and will need to Renally Adjust  Meds -Continue to Monitor and Trend Renal Function carefully and repeat CMP in the AM   QT prolongation -Monitor potassium and magnesium  levels and repleting so K+ is >4.0 and Mg is > 2.0  -Avoid QT prolonging drugs and follow-up repeat EKG in the morning.   Hypertension -Holding antihypertensives at this time and check orthostatics. -Continue to Monitor BP per Protocol -Per Cardiology if Orthostatic VS normal can resume home Medications  -Last BP reading was 150/85   Hyperlipidemia -Has Hx of Statin Allergy which caused peeling of the Skin -Cardiology getting FLP in the AM  -C/w Ezetimibe  10 mg po Daily   Leukocytosis -Likely Reactive -WBC Trend as above -Continue to Monitor for S/Sx of Infection -Repeat CBC in the AM  GERD/GI Prophylaxis  -Resume PPI with Pantoprazole  40 mg BID x2  Anxiety and Depression -C/w Buspirone  20 mg po BID and Mirtazepine 15 mg po qHS  Chronic Back Pain -Currently holding Tizanidine 2 mg po BID and Cylobenzaprine 10 mg po TIDprn Muscle Spasms -Resume Home Hydrocodone -Acetaminophen  10-325 mg po q4hprn  Dementia -C/w Delirium precautions. -Resume Home Donepezil  10 mg po at bedtime  Normocytic Anemia -Hgb/Hct Trend: Recent Labs  Lab 04/01/23 1654 04/01/23 1709 04/02/23 0425  HGB 13.3 13.3 11.8*  HCT 39.7 39.0 35.7*  MCV 93.2  --  93.0  -Check Anemia Panel in the AM -Continue to Monitor for S/Sx of Bleeding; No overt bleeding noted -Repeat CBC in the AM  Hypoalbuminemia -Patient's Albumin  Trend: Recent Labs  Lab 04/01/23 1654  ALBUMIN  3.1*  -Continue to Monitor and Trend and repeat CMP in the AM   DVT prophylaxis: enoxaparin  (LOVENOX ) injection 30 mg Start: 04/02/23 1000    Code Status: Full Code Family Communication: No family currently at bedside with patient's on the phone with her daughter while I was in the room  Disposition Plan:  Level of care: Progressive Status is: Observation The patient remains OBS appropriate and  will d/c before 2 midnights.   Consultants:  Cardiology  Procedures:  ECHOCARDIOGRAM (ordered and pending)  Antimicrobials:  Anti-infectives (From admission, onward)    None       Subjective: Seen and examined at bedside and denies any complaints or concerns.  No nausea or vomiting.  Denies lightheadedness or dizziness.  Feels okay.  Has some pain on her legs on palpation.  No other concerns or complaints at time.  Objective: Vitals:   04/02/23 0730 04/02/23 0800 04/02/23 0901 04/02/23 0901  BP: (!) 166/73 (!) 165/67 (!) 150/85   Pulse: 69 71 76   Resp: 15 16 15    Temp:    98.2 F (36.8 C)  TempSrc:    Oral  SpO2: 94% 93% 97%   Weight:      Height:       No intake or output data in the 24 hours ending 04/02/23 0955 Filed Weights   04/01/23 1647  Weight: 56.7 kg   Examination: Physical Exam:  Constitutional: Thin elderly Caucasian female in no acute distress Respiratory: Diminished to auscultation bilaterally, no wheezing, rales, rhonchi or crackles. Normal respiratory effort and patient is not tachypenic. No accessory muscle use.  Unlabored breathing Cardiovascular: RRR, no murmurs / rubs / gallops.  S1 and S2 auscultated. No extremity edema. Abdomen: Soft, non-tender, non-distended. Bowel sounds positive.  GU: Deferred. Musculoskeletal: No clubbing / cyanosis of digits/nails. No joint deformity upper and lower extremities.  Has some slight pain on palpation of the lower extremities Skin: No rashes, lesions, ulcers on limited skin evaluation. No induration; Warm and dry.  Neurologic: CN 2-12 grossly intact with no focal deficits. Romberg sign and cerebellar reflexes not assessed.  Psychiatric: Normal judgment and insight.  She is awake and alert   Data Reviewed: I have personally reviewed following labs and imaging studies  CBC: Recent Labs  Lab 04/01/23 1654 04/01/23 1709 04/02/23 0425  WBC 11.4*  --  10.7*  NEUTROABS 8.9*  --   --   HGB 13.3 13.3 11.8*   HCT 39.7 39.0 35.7*  MCV 93.2  --  93.0  PLT 215  --  190   Basic Metabolic Panel: Recent Labs  Lab 04/01/23 1654 04/01/23 1709 04/02/23 0425  NA 140 140 138  K 3.7 3.7 3.6  CL 106 106 107  CO2 24  --  22  GLUCOSE 111* 108* 93  BUN 21 24* 17  CREATININE 1.02* 1.10* 0.70  CALCIUM 8.7*  --  8.8*  MG  --   --  1.8   GFR: Estimated Creatinine Clearance: 42.9 mL/min (by C-G formula based on SCr of 0.7 mg/dL). Liver Function Tests: Recent Labs  Lab 04/01/23 1654  AST 34  ALT 16  ALKPHOS 54  BILITOT 0.5  PROT 5.9*  ALBUMIN  3.1*   No results for input(s): LIPASE, AMYLASE in the last 168 hours. No results for input(s): AMMONIA in the last 168 hours. Coagulation Profile: No results for input(s): INR, PROTIME in the last 168 hours. Cardiac Enzymes: No results for input(s): CKTOTAL, CKMB, CKMBINDEX, TROPONINI in the last 168 hours. BNP (last 3 results) No results for input(s): PROBNP in the last 8760 hours. HbA1C: No results for input(s): HGBA1C in the last 72 hours. CBG: No results for input(s): GLUCAP in the last 168 hours. Lipid Profile: No results for input(s): CHOL, HDL, LDLCALC, TRIG, CHOLHDL, LDLDIRECT in the last 72 hours. Thyroid  Function Tests: No results for input(s): TSH, T4TOTAL, FREET4, T3FREE, THYROIDAB in the last 72 hours. Anemia Panel: No results for input(s): VITAMINB12, FOLATE, FERRITIN, TIBC, IRON, RETICCTPCT in the last 72 hours. Sepsis Labs: No results for input(s): PROCALCITON, LATICACIDVEN in the last 168 hours.  No results found for this or any previous visit (from the past 240 hours).   Radiology Studies: CT Head Wo Contrast Result Date: 04/01/2023 CLINICAL DATA:  Syncope/presyncope, cerebrovascular cause suspected EXAM: CT HEAD WITHOUT CONTRAST TECHNIQUE: Contiguous axial images were obtained from the base of the skull through the vertex without intravenous contrast. RADIATION DOSE  REDUCTION: This exam was performed according to the departmental dose-optimization program which includes automated exposure control, adjustment of the mA and/or kV according to patient size and/or use of iterative reconstruction technique. COMPARISON:  09/19/2022 FINDINGS: Brain: Age related volume loss. Chronic small-vessel ischemic changes the white matter. No sign of acute infarction, mass lesion, hemorrhage, hydrocephalus or extra-axial collection. Vascular: There is atherosclerotic calcification of the major vessels at the base of the brain. Skull: Negative Sinuses/Orbits: Clear/normal Other: None IMPRESSION: No acute CT finding. Age related volume loss. Chronic small-vessel ischemic changes of the white matter. Electronically Signed   By: Oneil Officer M.D.   On: 04/01/2023 20:02   CT Angio Chest PE W and/or Wo Contrast Result Date: 04/01/2023 CLINICAL DATA:  Syncope/presyncope, cerebrovascular cause suspected. Chest pain. Possible pulmonary embolism. EXAM: CT ANGIOGRAPHY CHEST WITH CONTRAST TECHNIQUE: Multidetector CT imaging of the chest was performed using the standard protocol during bolus administration of intravenous contrast. Multiplanar CT image reconstructions and MIPs were obtained to evaluate the vascular anatomy. RADIATION DOSE REDUCTION: This exam was performed according to the departmental dose-optimization program which includes automated exposure control, adjustment of the mA and/or kV according to patient size and/or use of iterative reconstruction technique. CONTRAST:  65mL OMNIPAQUE  IOHEXOL  350 MG/ML SOLN COMPARISON:  12/09/2015 CT.  Chest radiography same day. FINDINGS: Cardiovascular: Heart size upper limits of normal. No right ventricular enlargement. No pericardial fluid. Some coronary artery calcification and aortic atherosclerotic calcification are present. Pulmonary arterial opacification is good. There are no pulmonary emboli. Mediastinum/Nodes: No mediastinal or hilar mass or  lymphadenopathy. Lungs/Pleura: Mild dependent atelectasis. No pleural effusion. No acute pneumonia. No mass or nodule. Upper Abdomen: No acute or significant finding. Musculoskeletal: Previous thoracolumbar fusion. Some degenerative change of the disc levels above fusion. No evidence of regional acute fracture. Review of the MIP images confirms the above findings. IMPRESSION: 1. No pulmonary emboli. 2. Coronary artery calcification. Aortic atherosclerotic calcification. 3. Previous thoracolumbar fusion. Some degenerative change of the disc levels above fusion. Electronically Signed   By: Oneil Officer M.D.   On: 04/01/2023 20:00   DG Chest Port 1 View Result Date: 04/01/2023 CLINICAL DATA:  Chest pain EXAM: PORTABLE CHEST 1 VIEW COMPARISON:  02/17/2020 FINDINGS: Stable cardiomediastinal silhouette. Aortic atherosclerotic calcification. Chronic bronchitic changes are similar to prior. No focal pneumonia, pleural effusion, or pneumothorax. Remote left rib fractures. Cervical and thoracolumbar fusion hardware. IMPRESSION: No active disease. Electronically Signed   By: Norman Gatlin M.D.   On: 04/01/2023 17:30   Scheduled Meds:  busPIRone   20 mg Oral BID   donepezil   10 mg Oral QHS   enoxaparin  (LOVENOX ) injection  30 mg Subcutaneous Q24H   ezetimibe   10 mg Oral Daily   mirtazapine   15 mg Oral QHS   pantoprazole   40 mg Oral BID   Continuous Infusions:  sodium chloride  75 mL/hr at 04/02/23 0636   magnesium  sulfate bolus IVPB      LOS: 0 days   Alejandro Lazarus Marker, DO [facility] Available via Epic secure chat 7am-7pm After these hours, please refer to coverage provider listed on [email or web address] 04/02/2023, 9:55 AM

## 2023-04-02 NOTE — Evaluation (Signed)
 Occupational Therapy Evaluation and Discharge Patient Details Name: Kaitlyn Serrano MRN: [id number] DOB: 10/28/1940 Today's Date: 04/02/2023   History of Present Illness Pt is an 83 year old woman who presented to the ED on 04/01/23 with chest discomfort and near syncope. Pt with elevated troponins, negative for orthostatic hypotension. PMH: dementia, HTN, HLD, anxiety, depression, chronic pain, DM, GERD.   Clinical Impression   Pt reports using her RW when she is feeling bad, otherwise she walks without AD. Pt sponge bathes and uses a sock aid for LB dressing. She relies on her daughter, with whom she lives, to manage her medications and for IADLs. Pt presents with impaired cognition, likely baseline, decreased dynamic standing balance with improved safety with RW. She overall requires set up to max assist for ADLs in the absence of AE. Recommend ADLs with nursing staff. No further acute OT needs.       If plan is discharge home, recommend the following: A little help with walking and/or transfers;A little help with bathing/dressing/bathroom;Direct supervision/assist for medications management;Assist for transportation;Help with stairs or ramp for entrance;Assistance with cooking/housework    Functional Status Assessment  Patient has not had a recent decline in their functional status  Equipment Recommendations  None recommended by OT    Recommendations for Other Services       Precautions / Restrictions Precautions Precautions: Fall Restrictions Weight Bearing Restrictions Per Provider Order: No      Mobility Bed Mobility Overal bed mobility: Needs Assistance Bed Mobility: Supine to Sit, Sit to Supine     Supine to sit: Supervision Sit to supine: Supervision   General bed mobility comments: supervision for lines    Transfers Overall transfer level: Needs assistance Equipment used: None Transfers: Sit to/from Stand Sit to Stand: Supervision                   Balance Overall balance assessment: Needs assistance   Sitting balance-Leahy Scale: Good       Standing balance-Leahy Scale: Fair                             ADL either performed or assessed with clinical judgement   ADL Overall ADL's : Needs assistance/impaired Eating/Feeding: Independent   Grooming: Wash/dry hands;Standing;Supervision/safety   Upper Body Bathing: Supervision/ safety;Standing   Lower Body Bathing: Supervison/ safety;Sit to/from stand   Upper Body Dressing : Minimal assistance;Standing   Lower Body Dressing: Maximal assistance;Sitting/lateral leans Lower Body Dressing Details (indicate cue type and reason): typically uses a sock aid Toilet Transfer: Contact guard assist;Ambulation   Toileting- Clothing Manipulation and Hygiene: Minimal assistance;Sit to/from stand       Functional mobility during ADLs: Contact guard assist       Vision Ability to See in Adequate Light: 0 Adequate Patient Visual Report: No change from baseline       Perception         Praxis         Pertinent Vitals/Pain Pain Assessment Pain Assessment: Faces Faces Pain Scale: Hurts even more Pain Location: back--chronic Pain Descriptors / Indicators: Grimacing, Guarding, Discomfort, Aching Pain Intervention(s): Monitored during session, Repositioned     Extremity/Trunk Assessment Upper Extremity Assessment Upper Extremity Assessment: Overall WFL for tasks assessed   Lower Extremity Assessment Lower Extremity Assessment: Defer to PT evaluation   Cervical / Trunk Assessment Cervical / Trunk Assessment: Other exceptions (chronic back pain, has had multiple back surgeries)   Communication Communication  Communication: No apparent difficulties   Cognition Arousal: Alert Behavior During Therapy: WFL for tasks assessed/performed Overall Cognitive Status: History of cognitive impairments - at baseline                                        General Comments       Exercises     Shoulder Instructions      Home Living Family/patient expects to be discharged to:: Private residence Living Arrangements: Children (daughter) Available Help at Discharge: Family;Available PRN/intermittently Type of Home: House Home Access: Level entry     Home Layout: One level     Bathroom Shower/Tub: Sponge bathes at baseline   Bathroom Toilet: Standard     Home Equipment: Cane - single Librarian, Academic (2 wheels);BSC/3in1;Adaptive equipment Adaptive Equipment: Sock aid Additional Comments: pt able to recall episode that led to admission, repeating that she needs to go home to do paperwork so she can go on a cruise      Prior Functioning/Environment Prior Level of Function : Needs assist             Mobility Comments: Uses her walker when she is feeling bad ADLs Comments: pt sponge bathes at sink, uses sock aid to don socks        OT Problem List:        OT Treatment/Interventions:      OT Goals(Current goals can be found in the care plan section)    OT Frequency:      Co-evaluation              AM-PAC OT 6 Clicks Daily Activity     Outcome Measure Help from another person eating meals?: None Help from another person taking care of personal grooming?: A Little Help from another person toileting, which includes using toliet, bedpan, or urinal?: A Little Help from another person bathing (including washing, rinsing, drying)?: A Little Help from another person to put on and taking off regular upper body clothing?: A Little Help from another person to put on and taking off regular lower body clothing?: A Little 6 Click Score: 19   End of Session Equipment Utilized During Treatment: Gait belt Nurse Communication: Mobility status  Activity Tolerance: Patient tolerated treatment well Patient left: in bed;with call bell/phone within reach;Other (comment) (MD in room)  OT Visit Diagnosis: Unsteadiness on  feet (R26.81);Other symptoms and signs involving cognitive function                Time: 1045-1055 OT Time Calculation (min): 10 min Charges:  OT General Charges $OT Visit: 1 Visit OT Evaluation $OT Eval Low Complexity: 1 Low  Kaitlyn Serrano, OTR/L Acute Rehabilitation Services Office: [phone]   Kaitlyn Serrano 04/02/2023, 11:46 AM

## 2023-04-02 NOTE — Progress Notes (Signed)
 Rounding Note    Patient Name: Kaitlyn Serrano Date of Encounter: 04/02/2023  [facility] Cardiologist: Alvan Carrier, MD   Subjective   Patient was admitted overnight after presenting with atypical chest pain and near syncope. Patient has underlying dementia so is somewhat of a difficult historian. She denies any having chest pain to me. However, she reported atypical chest pain to other providers on admission that was reproducible with palpation. She also reported some intermittent chest pain and shortness of breath when walking over the last week to the Cardiology fellow overnight. However, she denies this with me. She states she came in because of lightheadedness. Patient states she was trying to stand up from a chair and was having trouble getting the foot rest down. Once she got up and started walking, she states she got lightheaded and had a loud noise in her head like a ringing bell and then fell. She does think she had LOC for a little bit. No family at bedside to confirm history. She denies any episodes like this before.  Inpatient Medications    Scheduled Meds:  enoxaparin  (LOVENOX ) injection  30 mg Subcutaneous Q24H   Continuous Infusions:  sodium chloride  75 mL/hr at 04/02/23 0636   PRN Meds: acetaminophen  **OR** acetaminophen    Vital Signs    Vitals:   04/01/23 2051 04/02/23 0245 04/02/23 0428 04/02/23 0508  BP:  (!) 151/65    Pulse:  67    Resp:  20    Temp: 98.6 F (37 C)  98.7 F (37.1 C) 98.2 F (36.8 C)  TempSrc: Oral  Oral Oral  SpO2:  90%    Weight:      Height:       No intake or output data in the 24 hours ending 04/02/23 0709    04/01/2023    4:47 PM 02/11/2023   11:13 AM 01/16/2023    2:20 PM  Last 3 Weights  Weight (lbs) 125 lb 117 lb 6.4 oz 116 lb 6.4 oz  Weight (kg) 56.7 kg 53.252 kg 52.799 kg      Telemetry    Normal sinus rhythm with rates in the 60s. - Personally Reviewed  ECG    No new ECG tracing today. -  Personally Reviewed  Physical Exam   GEN: Thin Caucasian female resting comfortably in no acute distress.   Neck: No JVD. Cardiac: RRR. No murmurs, rubs, or gallops.  Respiratory: Clear to auscultation bilaterally. No wheezes, rhonchi, or rales. MS: No lower extremity edema. No deformity. Neuro:  No focal deficits. Psych: Normal affect. Responds appropriately.  Labs    High Sensitivity Troponin:   Recent Labs  Lab 04/01/23 1654 04/01/23 1906  TROPONINIHS 746* 418*     Chemistry Recent Labs  Lab 04/01/23 1654 04/01/23 1709 04/02/23 0425  NA 140 140 138  K 3.7 3.7 3.6  CL 106 106 107  CO2 24  --  22  GLUCOSE 111* 108* 93  BUN 21 24* 17  CREATININE 1.02* 1.10* 0.70  CALCIUM 8.7*  --  8.8*  MG  --   --  1.8  PROT 5.9*  --   --   ALBUMIN  3.1*  --   --   AST 34  --   --   ALT 16  --   --   ALKPHOS 54  --   --   BILITOT 0.5  --   --   GFRNONAA 55*  --  >60  ANIONGAP 10  --  9    Lipids No results for input(s): CHOL, TRIG, HDL, LABVLDL, LDLCALC, CHOLHDL in the last 168 hours.  Hematology Recent Labs  Lab 04/01/23 1654 04/01/23 1709 04/02/23 0425  WBC 11.4*  --  10.7*  RBC 4.26  --  3.84*  HGB 13.3 13.3 11.8*  HCT 39.7 39.0 35.7*  MCV 93.2  --  93.0  MCH 31.2  --  30.7  MCHC 33.5  --  33.1  RDW 13.3  --  13.4  PLT 215  --  190   Thyroid  No results for input(s): TSH, FREET4 in the last 168 hours.  BNPNo results for input(s): BNP, PROBNP in the last 168 hours.  DDimer No results for input(s): DDIMER in the last 168 hours.   Radiology    CT Head Wo Contrast Result Date: 04/01/2023 CLINICAL DATA:  Syncope/presyncope, cerebrovascular cause suspected EXAM: CT HEAD WITHOUT CONTRAST TECHNIQUE: Contiguous axial images were obtained from the base of the skull through the vertex without intravenous contrast. RADIATION DOSE REDUCTION: This exam was performed according to the departmental dose-optimization program which includes automated exposure  control, adjustment of the mA and/or kV according to patient size and/or use of iterative reconstruction technique. COMPARISON:  09/19/2022 FINDINGS: Brain: Age related volume loss. Chronic small-vessel ischemic changes the white matter. No sign of acute infarction, mass lesion, hemorrhage, hydrocephalus or extra-axial collection. Vascular: There is atherosclerotic calcification of the major vessels at the base of the brain. Skull: Negative Sinuses/Orbits: Clear/normal Other: None IMPRESSION: No acute CT finding. Age related volume loss. Chronic small-vessel ischemic changes of the white matter. Electronically Signed   By: Oneil Officer M.D.   On: 04/01/2023 20:02   CT Angio Chest PE W and/or Wo Contrast Result Date: 04/01/2023 CLINICAL DATA:  Syncope/presyncope, cerebrovascular cause suspected. Chest pain. Possible pulmonary embolism. EXAM: CT ANGIOGRAPHY CHEST WITH CONTRAST TECHNIQUE: Multidetector CT imaging of the chest was performed using the standard protocol during bolus administration of intravenous contrast. Multiplanar CT image reconstructions and MIPs were obtained to evaluate the vascular anatomy. RADIATION DOSE REDUCTION: This exam was performed according to the departmental dose-optimization program which includes automated exposure control, adjustment of the mA and/or kV according to patient size and/or use of iterative reconstruction technique. CONTRAST:  65mL OMNIPAQUE  IOHEXOL  350 MG/ML SOLN COMPARISON:  12/09/2015 CT.  Chest radiography same day. FINDINGS: Cardiovascular: Heart size upper limits of normal. No right ventricular enlargement. No pericardial fluid. Some coronary artery calcification and aortic atherosclerotic calcification are present. Pulmonary arterial opacification is good. There are no pulmonary emboli. Mediastinum/Nodes: No mediastinal or hilar mass or lymphadenopathy. Lungs/Pleura: Mild dependent atelectasis. No pleural effusion. No acute pneumonia. No mass or nodule. Upper  Abdomen: No acute or significant finding. Musculoskeletal: Previous thoracolumbar fusion. Some degenerative change of the disc levels above fusion. No evidence of regional acute fracture. Review of the MIP images confirms the above findings. IMPRESSION: 1. No pulmonary emboli. 2. Coronary artery calcification. Aortic atherosclerotic calcification. 3. Previous thoracolumbar fusion. Some degenerative change of the disc levels above fusion. Electronically Signed   By: Oneil Officer M.D.   On: 04/01/2023 20:00   DG Chest Port 1 View Result Date: 04/01/2023 CLINICAL DATA:  Chest pain EXAM: PORTABLE CHEST 1 VIEW COMPARISON:  02/17/2020 FINDINGS: Stable cardiomediastinal silhouette. Aortic atherosclerotic calcification. Chronic bronchitic changes are similar to prior. No focal pneumonia, pleural effusion, or pneumothorax. Remote left rib fractures. Cervical and thoracolumbar fusion hardware. IMPRESSION: No active disease. Electronically Signed   By: Norman Charletta HERO.D.  On: 04/01/2023 17:30    Cardiac Studies   Myoview 06/07/2014: Impression: 1. No reversible ischemia or infarction. 2. Normal left ventricular wall motion. 3. Left ventricular ejection fraction 57% 4. Low-risk stress test findings*. _______________  Echocardiogram 09/08/2014: Study Conclusions: - Left ventricle: The cavity size was normal. Wall thickness was    normal. Systolic function was normal. The estimated ejection    fraction was in the range of 60% to 65%. Wall motion was normal;    there were no regional wall motion abnormalities. Doppler    parameters are consistent with abnormal left ventricular    relaxation (grade 1 diastolic dysfunction).  - Aortic valve: Mildly calcified annulus. Trileaflet.  - Mitral valve: Mildly calcified annulus. Mildly calcified leaflets. There was mild regurgitation.  - Tricuspid valve: There was mild regurgitation.    Patient Profile     83 y.o. female with a history of chest pain with  negative Myoview in 2012 and 2016, palpitations, hypertension, hyperlipidemia, type 2 diabetes mellitus, GERD, anxiety/ depression, chronic pain syndrome, and moderate dementia who presented on 04/01/2023 for further evaluation of chest pain and near syncope. Cardiology consulted for further evaluation.   Assessment & Plan    Chest Pain Elevated Troponin Patient presented with atypical chest pain that was reproducible with palpation as well as near syncope. However, she also reported intermittent chest pain and shortness of breath when walking over the last week. EKG showed isolated ST elevation in V2 and new T wave inversions in I and aVL.  High-sensitivity troponin 746 >> 418.  Chest CTA was negative for PE but did show coronary artery calcifications. - Patient denies having any chest pain to me. She has underlying dementia so history is difficult.  - Echo pending. - Continue Aspirin  81mg  daily. - History of statin allergy (caused peeling of the skin). Continue Zetia  10mg  daily. - With reports of intermittent exertional chest pain and shortness of breath that started one week ago and down-trending troponin, I wonder if she could of had a cardiac event several days ago. She currently denies any symptoms. Therefore, I think we can wait on the Echo and then decide on what type of ischemic evaluation is indicated.  Near Syncope Patient describe possible syncopal episode that occurred after getting up from a chair and walking. She thinks she may of had LOC for a little. She states she had associated palpitations, lightheadedness, and a loud sensation her head like a bell was ringing. She received some IV fluids in the ED. - Hemodynamically stable. - Echo pending. - No significant arrhythmias on telemetry. - Will check orthostatic vital signs. If negative, can consider consider outpatient monitor.  Hypertension BP mildly elevated. - Will check orthostatic vital signs. If normal, can start to resume  home medications.  Hyperlipidemia - History of statin allergy (caused peeling of the skin). Continue Zetia  10mg  daily. - Will check fasting lipid panel tomorrow morning.  AKI Creatinine 1.02 on admission. Baseline around 6.8. - Improved after IV fluids. Creatinine 0.7 this morning.  Otherwise, per primary team: - Type 2 diabetes mellitus - Anxiety/ depression - Chronic pain - Dementia  For questions or updates, please contact [facility] Please consult [email or web address] for contact info under        Signed, Raynell Scott E Kaleiyah Polsky, PA-C  04/02/2023, 7:09 AM

## 2023-04-02 NOTE — Evaluation (Signed)
 Physical Therapy Evaluation Patient Details Name: Kaitlyn Serrano MRN: [id number] DOB: 07-28-1940 Today's Date: 04/02/2023  History of Present Illness  Pt is an 83 year old woman who presented to the ED on 04/01/23 with chest discomfort and near syncope. Pt with elevated troponins, negative for orthostatic hypotension. PMH: dementia, HTN, HLD, anxiety, depression, chronic pain, DM, GERD.  Clinical Impression  Pt admitted with above diagnosis. Pt was able to ambulate without device with no LOB without challenges however with mod challenges, pt does benefit from use of RW. Pt has a RW at home she can use.  Pt scored 20/24 on DGI suggesting low risk of fall. Will follow acutely to work on balance but shouldn't need f/u PT and pt has equipment. Pt agrees.   Pt currently with functional limitations due to the deficits listed below (see PT Problem List). Pt will benefit from acute skilled PT to increase their independence and safety with mobility to allow discharge.           If plan is discharge home, recommend the following: Assistance with cooking/housework;Assist for transportation;Help with stairs or ramp for entrance   Can travel by private vehicle        Equipment Recommendations None recommended by PT  Recommendations for Other Services       Functional Status Assessment Patient has had a recent decline in their functional status and demonstrates the ability to make significant improvements in function in a reasonable and predictable amount of time.     Precautions / Restrictions Precautions Precautions: Fall Restrictions Weight Bearing Restrictions Per Provider Order: No      Mobility  Bed Mobility Overal bed mobility: Needs Assistance Bed Mobility: Supine to Sit, Sit to Supine     Supine to sit: Supervision Sit to supine: Supervision   General bed mobility comments: supervision for lines. Pt took incr tmie to come to edge of stretcher stating I have chronic back pain     Transfers Overall transfer level: Needs assistance Equipment used: None Transfers: Sit to/from Stand Sit to Stand: Supervision           General transfer comment: No assist needed to come to standing.    Ambulation/Gait Ambulation/Gait assistance: Contact guard assist Gait Distance (Feet): 150 Feet Assistive device: None, Rolling walker (2 wheels) Gait Pattern/deviations: Step-through pattern, Decreased stride length   Gait velocity interpretation: 1.31 - 2.62 ft/sec, indicative of limited community ambulator   General Gait Details: Pt was able to ambulate without device with overall fair stabiity and no significant LOB without challenges. Pt did veer from path with challenges thefoere introduced RW and pt did better with RW.  Stairs            Wheelchair Mobility     Tilt Bed    Modified Rankin (Stroke Patients Only)       Balance Overall balance assessment: Needs assistance Sitting-balance support: No upper extremity supported, Feet supported Sitting balance-Leahy Scale: Good     Standing balance support: Bilateral upper extremity supported, During functional activity, No upper extremity supported Standing balance-Leahy Scale: Fair Standing balance comment: Can stand statically without UE support and also can do some activities dynamically without Ue support.                 Standardized Balance Assessment Standardized Balance Assessment : Dynamic Gait Index   Dynamic Gait Index Level Surface: Normal Change in Gait Speed: Normal Gait with Horizontal Head Turns: Mild Impairment Gait with Vertical Head Turns: Mild  Impairment Gait and Pivot Turn: Normal Step Over Obstacle: Mild Impairment Step Around Obstacles: Normal Steps: Mild Impairment Total Score: 20       Pertinent Vitals/Pain Pain Assessment Pain Assessment: Faces Faces Pain Scale: Hurts even more Pain Location: back--chronic Pain Descriptors / Indicators: Grimacing, Guarding,  Discomfort, Aching Pain Intervention(s): Limited activity within patient's tolerance, Monitored during session, Repositioned    Home Living Family/patient expects to be discharged to:: Private residence Living Arrangements: Children (daughter) Available Help at Discharge: Family;Available PRN/intermittently Type of Home: House Home Access: Level entry       Home Layout: One level Home Equipment: Cane - single Librarian, Academic (2 wheels);BSC/3in1;Adaptive equipment Additional Comments: pt able to recall episode that led to admission, repeating that she needs to go home to do paperwork so she can go on a cruise    Prior Function Prior Level of Function : Needs assist             Mobility Comments: Uses her walker when she is feeling bad ADLs Comments: pt sponge bathes at sink, uses sock aid to don socks     Extremity/Trunk Assessment   Upper Extremity Assessment Upper Extremity Assessment: Defer to OT evaluation    Lower Extremity Assessment Lower Extremity Assessment: Generalized weakness    Cervical / Trunk Assessment Cervical / Trunk Assessment: Other exceptions (chronic back pain, has had multiple back surgeries)  Communication   Communication Communication: No apparent difficulties  Cognition Arousal: Alert Behavior During Therapy: WFL for tasks assessed/performed Overall Cognitive Status: History of cognitive impairments - at baseline                                          General Comments General comments (skin integrity, edema, etc.): VSS    Exercises     Assessment/Plan    PT Assessment Patient needs continued PT services  PT Problem List Decreased activity tolerance;Decreased balance;Decreased mobility;Decreased knowledge of use of DME;Decreased safety awareness;Decreased knowledge of precautions       PT Treatment Interventions DME instruction;Gait training;Functional mobility training;Therapeutic activities;Therapeutic  exercise;Balance training;Patient/family education    PT Goals (Current goals can be found in the Care Plan section)  Acute Rehab PT Goals Patient Stated Goal: to go home PT Goal Formulation: With patient Time For Goal Achievement: 04/16/23 Potential to Achieve Goals: Good    Frequency Min 1X/week     Co-evaluation PT/OT/SLP Co-Evaluation/Treatment: Yes Reason for Co-Treatment: Complexity of the patient's impairments (multi-system involvement);For patient/therapist safety PT goals addressed during session: Mobility/safety with mobility         AM-PAC PT 6 Clicks Mobility  Outcome Measure Help needed turning from your back to your side while in a flat bed without using bedrails?: None Help needed moving from lying on your back to sitting on the side of a flat bed without using bedrails?: None Help needed moving to and from a bed to a chair (including a wheelchair)?: A Little Help needed standing up from a chair using your arms (e.g., wheelchair or bedside chair)?: A Little Help needed to walk in hospital room?: A Little Help needed climbing 3-5 steps with a railing? : A Little 6 Click Score: 20    End of Session Equipment Utilized During Treatment: Gait belt Activity Tolerance: Patient tolerated treatment well Patient left: with call bell/phone within reach (on stretcher) Nurse Communication: Mobility status PT Visit Diagnosis: Muscle weakness (generalized) (  M62.81)    Time: 8953-8895 PT Time Calculation (min) (ACUTE ONLY): 18 min   Charges:   PT Evaluation $PT Eval Moderate Complexity: 1 Mod   PT General Charges $$ ACUTE PT VISIT: 1 Visit         Carinna Newhart M,PT Acute Rehab Services [phone]   Stephane JULIANNA Bevel 04/02/2023, 1:08 PM

## 2023-04-02 NOTE — Hospital Course (Addendum)
 The patient is an 83 year old elderly Caucasian female past medical history significant for Mann[place] to hypertension, hyperlipidemia, GERD, anxiety, depression, chronic back pain, mild dementia as well as other comorbidities who presented to the ED via EMS for evaluation of right-sided chest discomfort and near syncope.  She took 4 baby aspirin 's prior to arrival and EKG on admission showed new ST elevations V2 and T wave inversion in lead I and aVL.  Cardiology was consulted and reviewed the EKG and felt is not consistent with a STEMI.  She states that she was watching television afternoon and got up to walk to the bedroom with all of a sudden she started feeling dizzy and lightheaded and felt like she is in a pass out.  She denied experiencing any chest pain, pressure, discomfort throughout the day or now and denies shortness of breath but has been having intermittent shortness breath and chest discomfort over the last week or so.  Currently she is resting comfortably.  Given her concerns she presented to the ED and she had a CTA of the chest was negative for PE but did show coronary artery calcification.  CT of the head was negative for any acute abnormalities.  Cardiology was consulted and recommended holding of IV heparin  given her troponin is trending down and currently she is undergoing further workup and echocardiogram is ordered and pending orthostatic vital signs and PT and OT evaluation.    Echocardiogram was done showed a lower EF so she was taken for cardiac cath which showed mild nonobstructive CAD with 20% stenosis in mid LAD.  She had mild mid anterior akinesis with question of Takostubo variation.  Cardiology recommended diuresis and further medication adjustments and they have now cleared her for discharge with close follow-up.  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  -Cardiology was consulted and reviewed the EKG and felt that it was not consistent with STEMI. -Troponin Trending down and went from 746 -> 418 -Cardiology recommending Holding Heparin  gtt at this time -CTA done and showed No pulmonary emboli. Coronary artery calcification. Aortic atherosclerotic calcification. Previous thoracolumbar fusion. Some degenerative change of the disc levels above fusion -Cardiology feels she could have had a Cardiac event with her intermittent CP and SOB that started a week ago and recommending ECHO and then deciding on what type of ischemic workup is indicated; ECHO was done and showed Left ventricular ejection fraction, by estimation, is 40 to 45%. The left ventricle has mildly decreased function. The left ventricle demonstrates global hypokinesis. There is mild concentric left ventricular hypertrophy. Left ventricular diastolic  function could not be evaluated. Because of echo findings cardiology recommended cardiac catheterization she was taken for cath and cath was done and showed Mild, non-obstructive coronary artery disease with 20% stenosis in the mid LAD.  Otherwise, no angiographically significant coronary artery disease identified. Moderately reduced left ventricular systolic function with mid anterior akinesis (LVEF 35-45%).  Question if this represents a Takotsubo variant. Moderately elevated left ventricular filling pressure (LVEDP 25 mmHg).Small bilateral radial arteries that would not allow passage of micropuncture wire above the mid forearms.  Recommend femoral access for future catheterizations.. -  Ideology recommending diuresis and escalation of GDMT for suspected Takotsubo cardiomyopathy -Given that she appeared volume up she was  given a dose of IV Lasix  and further medication adjustments have been made and her lisinopril  is being changed to losartan  100 mg daily with plans to switch to Entresto at some point but will need an ACE inhibitor washout.  They are also recommending starting Aldactone  and they feel that patient not a candidate for SGLT2's inhibitor -Cardiology feels the patient can be discharged today and have not made some further recommendations and recommended continuing Cozaar , Aldactone  as daily and then changing atenolol  to Coreg  6.25 mg p.o. twice daily and arranging follow-up with her cardiologist in outpatient setting and she was told to stop atenolol  hydralazine  and lisinopril   Near Syncope -Patient presented to the ED via EMS for evaluation of right-sided chest pain and near syncope.  She took 4 baby aspirin 's prior to arrival.  Troponin elevated but trended down (746> 418) and not consistent with ACS.   -Cardiology recommended holding off starting IV heparin .   -WBC Trend: Recent Labs  Lab 04/01/23 1654 04/02/23 0425 04/03/23 0421 04/04/23 0340 04/05/23 0319  WBC 11.4* 10.7* 9.3 8.1 8.4  -CTA chest negative for PE but showing coronary artery calcification.   -CT head was also done for evaluation of near syncope and is negative for acute intracranial abnormality.   -She has no focal neurodeficit on exam and currently patient denies experiencing any chest pain/pressure/discomfort.   -Continue cardiac monitoring and echocardiogram ordered and pending.  -She was in sinus tachycardia yesterday morning but is stable today -Fall precautions. -No longer getting IVF Hydration with NS at 75 mL/hr as this has stopped -ECHOCardiogram done as above -Cardiology considering outpatient Monitor if Orthostatic VS Negative; Check Orthostatic VS again this AM and she did not drop -PT OT evaluated and recommending no follow-up   AKI, improved -Mild and improved. Creatinine baseline appears to be 0.6-0.7.    -BUN/Cr Trend: Recent Labs  Lab 04/01/23 1654 04/01/23 1709 04/02/23 0425 04/03/23 0421 04/04/23 0340 04/05/23 0319  BUN 21 24* 17 10 6* 13  CREATININE 1.02* 1.10* 0.70 0.63 0.60 0.79  -C/w Gentle IV fluid hydration with NS at 75 mL/hr x12 hours and monitor renal function.  -Avoid Nephrotoxic Medications and currently holding home Lisinopril , Contrast Dyes, Hypotension and Dehydration to Ensure Adequate Renal Perfusion and will need to Renally Adjust Meds -Continue to Monitor and Trend Renal Function carefully and repeat CMP in the AM   QT prolongation -Monitor potassium and magnesium  levels and repleting so K+ is >4.0 and Mg is > 2.0  -Avoid QT prolonging drugs and follow-up repeat EKG in the morning.  Hypokalemia -Patient's K+ Level Trend: Recent Labs  Lab 04/01/23 1654 04/01/23 1709 04/02/23 0425 04/03/23 0421 04/04/23 0340 04/05/23 0319  K 3.7 3.7 3.6 3.4* 4.1 4.3  -Continue to Monitor and Replete as Necessary -Repeat CMP in the AM    Hypertension -Holding antihypertensives at this time and check orthostatics. -Continue to Monitor BP per Protocol -Per Cardiology if Orthostatic VS normal can resume home Medications  -Last BP reading was elevated 166/67   Hyperlipidemia -Has Hx of Statin Allergy which caused peeling of the Skin -Cardiology obtained FLP this AM and showed a total cholesterol/HDL ratio of 2.7, cholesterol 151, HDL 56, LDL of 80, triglycerides of 77, VLDL of 15 -C/w Ezetimibe  10 mg po Daily   Leukocytosis -Likely Reactive and improved -WBC Trend as above -Continue to Monitor for S/Sx of  Infection -Repeat CBC in the AM  GERD/GI Prophylaxis  -Resume PPI with Pantoprazole  40 mg BID x2  Anxiety and Depression -C/w Buspirone  20 mg po BID and Mirtazepine 15 mg po at bedtime -Was very Anxious and upset today  Chronic Back Pain -Currently holding Tizanidine 2 mg po BID and Cylobenzaprine 10 mg po TIDprn Muscle Spasms -Resume Home  Hydrocodone -Acetaminophen  10-325 mg po q4hprn  Dementia -C/w Delirium precautions. -Resume Home Donepezil  10 mg po at bedtime  Normocytic Anemia, improved -Hgb/Hct Trend: Recent Labs  Lab 04/01/23 1654 04/01/23 1709 04/02/23 0425 04/03/23 0421 04/04/23 0340 04/05/23 0319  HGB 13.3 13.3 11.8* 12.5 13.2 12.4  HCT 39.7 39.0 35.7* 36.5 38.6 37.2  MCV 93.2  --  93.0 91.0 90.6 91.9  -Checked Anemia Panel and showed an iron level of 22, UIBC 278, TIBC of 300, saturation 7%, corrected level 74, folate of 36.7 and vitamin B12 level of 319 -Continue to Monitor for S/Sx of Bleeding; No overt bleeding noted -Repeat CBC in the AM  Hypoalbuminemia -Patient's Albumin  Trend: Recent Labs  Lab 04/01/23 1654 04/03/23 0421 04/04/23 0340 04/05/23 0319  ALBUMIN  3.1* 2.8* 3.0* 2.9*  -Continue to Monitor and Trend and repeat CMP in the AM

## 2023-04-03 ENCOUNTER — Observation Stay (HOSPITAL_COMMUNITY): Payer: Medicare HMO

## 2023-04-03 ENCOUNTER — Ambulatory Visit (HOSPITAL_COMMUNITY)
Admission: EM | Disposition: A | Discharge status: Home Health | Payer: Self-pay | Source: Home / Self Care | Attending: Emergency Medicine

## 2023-04-03 DIAGNOSIS — I5021 Acute systolic (congestive) heart failure: Secondary | ICD-10-CM | POA: Diagnosis not present

## 2023-04-03 DIAGNOSIS — R7989 Other specified abnormal findings of blood chemistry: Secondary | ICD-10-CM

## 2023-04-03 DIAGNOSIS — N179 Acute kidney failure, unspecified: Secondary | ICD-10-CM | POA: Diagnosis not present

## 2023-04-03 DIAGNOSIS — I214 Non-ST elevation (NSTEMI) myocardial infarction: Secondary | ICD-10-CM

## 2023-04-03 DIAGNOSIS — E782 Mixed hyperlipidemia: Secondary | ICD-10-CM | POA: Diagnosis not present

## 2023-04-03 DIAGNOSIS — R55 Syncope and collapse: Secondary | ICD-10-CM | POA: Diagnosis not present

## 2023-04-03 DIAGNOSIS — R079 Chest pain, unspecified: Secondary | ICD-10-CM | POA: Diagnosis not present

## 2023-04-03 DIAGNOSIS — I5181 Takotsubo syndrome: Secondary | ICD-10-CM | POA: Diagnosis not present

## 2023-04-03 DIAGNOSIS — R9431 Abnormal electrocardiogram [ECG] [EKG]: Secondary | ICD-10-CM

## 2023-04-03 DIAGNOSIS — I10 Essential (primary) hypertension: Secondary | ICD-10-CM | POA: Diagnosis not present

## 2023-04-03 DIAGNOSIS — R42 Dizziness and giddiness: Secondary | ICD-10-CM | POA: Diagnosis not present

## 2023-04-03 DIAGNOSIS — K219 Gastro-esophageal reflux disease without esophagitis: Secondary | ICD-10-CM | POA: Diagnosis not present

## 2023-04-03 HISTORY — DX: Acute systolic (congestive) heart failure: I50.21

## 2023-04-03 HISTORY — PX: LEFT HEART CATH AND CORONARY ANGIOGRAPHY: CATH118249

## 2023-04-03 HISTORY — DX: Non-ST elevation (NSTEMI) myocardial infarction: I21.4

## 2023-04-03 LAB — LIPID PANEL
Cholesterol: 151 mg/dL (ref 0–200)
HDL: 56 mg/dL (ref >40)
LDL Cholesterol: 80 mg/dL (ref 0–99)
Total CHOL/HDL Ratio: 2.7 {ratio}
Triglycerides: 77 mg/dL (ref <150)
VLDL: 15 mg/dL (ref 0–40)

## 2023-04-03 LAB — CBC WITH DIFFERENTIAL/PLATELET
Abs Immature Granulocytes: 0.05 10*3/uL (ref 0.00–0.07)
Basophils Absolute: 0.1 10*3/uL (ref 0.0–0.1)
Basophils Relative: 1 %
Eosinophils Absolute: 0.3 10*3/uL (ref 0.0–0.5)
Eosinophils Relative: 3 %
HCT: 36.5 % (ref 36.0–46.0)
Hemoglobin: 12.5 g/dL (ref 12.0–15.0)
Immature Granulocytes: 1 %
Lymphocytes Relative: 12 %
Lymphs Abs: 1.1 10*3/uL (ref 0.7–4.0)
MCH: 31.2 pg (ref 26.0–34.0)
MCHC: 34.2 g/dL (ref 30.0–36.0)
MCV: 91 fL (ref 80.0–100.0)
Monocytes Absolute: 1.2 10*3/uL — ABNORMAL HIGH (ref 0.1–1.0)
Monocytes Relative: 13 %
Neutro Abs: 6.6 10*3/uL (ref 1.7–7.7)
Neutrophils Relative %: 70 %
Platelets: 174 10*3/uL (ref 150–400)
RBC: 4.01 MIL/uL (ref 3.87–5.11)
RDW: 13 % (ref 11.5–15.5)
WBC: 9.3 10*3/uL (ref 4.0–10.5)
nRBC: 0 % (ref 0.0–0.2)

## 2023-04-03 LAB — COMPREHENSIVE METABOLIC PANEL
ALT: 13 U/L (ref 0–44)
AST: 26 U/L (ref 15–41)
Albumin: 2.8 g/dL — ABNORMAL LOW (ref 3.5–5.0)
Alkaline Phosphatase: 48 U/L (ref 38–126)
Anion gap: 8 (ref 5–15)
BUN: 10 mg/dL (ref 8–23)
CO2: 22 mmol/L (ref 22–32)
Calcium: 8.7 mg/dL — ABNORMAL LOW (ref 8.9–10.3)
Chloride: 107 mmol/L (ref 98–111)
Creatinine, Ser: 0.63 mg/dL (ref 0.44–1.00)
GFR, Estimated: >60 mL/min (ref >60)
Glucose, Bld: 101 mg/dL — ABNORMAL HIGH (ref 70–99)
Potassium: 3.4 mmol/L — ABNORMAL LOW (ref 3.5–5.1)
Sodium: 137 mmol/L (ref 135–145)
Total Bilirubin: 1.1 mg/dL (ref 0.0–1.2)
Total Protein: 5.6 g/dL — ABNORMAL LOW (ref 6.5–8.1)

## 2023-04-03 LAB — ECHOCARDIOGRAM COMPLETE
Area-P 1/2: 4.4 cm2
Calc EF: 43.3 %
Height: 62 in
S' Lateral: 3.2 cm
Single Plane A2C EF: 49.5 %
Single Plane A4C EF: 35.9 %
Weight: 2000 [oz_av]

## 2023-04-03 LAB — MAGNESIUM: Magnesium: 1.9 mg/dL (ref 1.7–2.4)

## 2023-04-03 LAB — PHOSPHORUS: Phosphorus: 2.8 mg/dL (ref 2.5–4.6)

## 2023-04-03 SURGERY — LEFT HEART CATH AND CORONARY ANGIOGRAPHY
Anesthesia: LOCAL

## 2023-04-03 MED ORDER — LISINOPRIL 20 MG PO TABS
20.0000 mg | ORAL_TABLET | Freq: Every day | ORAL | Status: DC
  Administered 2023-04-03 – 2023-04-04 (×2): 20 mg via ORAL
  Filled 2023-04-03 (×2): qty 1

## 2023-04-03 MED ORDER — SODIUM CHLORIDE 0.9 % IV SOLN: INTRAVENOUS | Status: DC

## 2023-04-03 MED ORDER — HYDRALAZINE HCL 20 MG/ML IJ SOLN
INTRAMUSCULAR | Status: DC | PRN
  Administered 2023-04-03 (×2): 5 mg via INTRAVENOUS

## 2023-04-03 MED ORDER — HEPARIN (PORCINE) IN NACL 2000-0.9 UNIT/L-% IV SOLN
INTRAVENOUS | Status: DC | PRN
  Administered 2023-04-03: 1000 mL

## 2023-04-03 MED ORDER — PERFLUTREN LIPID MICROSPHERE
1.0000 mL | INTRAVENOUS | Status: AC | PRN
  Administered 2023-04-03: 1 mL via INTRAVENOUS

## 2023-04-03 MED ORDER — ENOXAPARIN SODIUM 40 MG/0.4ML IJ SOSY: 40.0000 mg | PREFILLED_SYRINGE | INTRAMUSCULAR | Status: DC

## 2023-04-03 MED ORDER — SODIUM CHLORIDE 0.9 % IV SOLN: 250.0000 mL | INTRAVENOUS | Status: AC | PRN

## 2023-04-03 MED ORDER — MIDAZOLAM HCL 2 MG/2ML IJ SOLN
INTRAMUSCULAR | Status: AC
  Filled 2023-04-03: qty 2

## 2023-04-03 MED ORDER — FUROSEMIDE 10 MG/ML IJ SOLN
20.0000 mg | Freq: Once | INTRAMUSCULAR | Status: AC
  Administered 2023-04-03: 20 mg via INTRAVENOUS
  Filled 2023-04-03: qty 2

## 2023-04-03 MED ORDER — FENTANYL CITRATE (PF) 100 MCG/2ML IJ SOLN
INTRAMUSCULAR | Status: DC | PRN
  Administered 2023-04-03: 12.5 ug via INTRAVENOUS

## 2023-04-03 MED ORDER — LIDOCAINE HCL (PF) 1 % IJ SOLN
INTRAMUSCULAR | Status: AC
  Filled 2023-04-03: qty 30

## 2023-04-03 MED ORDER — VERAPAMIL HCL 2.5 MG/ML IV SOLN
INTRAVENOUS | Status: AC
  Filled 2023-04-03: qty 2

## 2023-04-03 MED ORDER — POTASSIUM CHLORIDE CRYS ER 20 MEQ PO TBCR
40.0000 meq | EXTENDED_RELEASE_TABLET | Freq: Two times a day (BID) | ORAL | Status: AC
  Administered 2023-04-03 (×2): 40 meq via ORAL
  Filled 2023-04-03 (×2): qty 2

## 2023-04-03 MED ORDER — HEPARIN SODIUM (PORCINE) 1000 UNIT/ML IJ SOLN
INTRAMUSCULAR | Status: AC
  Filled 2023-04-03: qty 10

## 2023-04-03 MED ORDER — FENTANYL CITRATE (PF) 100 MCG/2ML IJ SOLN
INTRAMUSCULAR | Status: AC
  Filled 2023-04-03: qty 2

## 2023-04-03 MED ORDER — MIDAZOLAM HCL 2 MG/2ML IJ SOLN
INTRAMUSCULAR | Status: DC | PRN
  Administered 2023-04-03: .5 mg via INTRAVENOUS

## 2023-04-03 MED ORDER — SODIUM CHLORIDE 0.9% FLUSH
3.0000 mL | Freq: Two times a day (BID) | INTRAVENOUS | Status: DC
  Administered 2023-04-03 – 2023-04-04 (×3): 3 mL via INTRAVENOUS

## 2023-04-03 MED ORDER — ASPIRIN 81 MG PO CHEW
81.0000 mg | CHEWABLE_TABLET | Freq: Once | ORAL | Status: AC
  Administered 2023-04-03: 81 mg via ORAL
  Filled 2023-04-03: qty 1

## 2023-04-03 MED ORDER — LABETALOL HCL 5 MG/ML IV SOLN
INTRAVENOUS | Status: AC
  Filled 2023-04-03: qty 4

## 2023-04-03 MED ORDER — HYDRALAZINE HCL 20 MG/ML IJ SOLN: 10.0000 mg | INTRAMUSCULAR | Status: AC | PRN

## 2023-04-03 MED ORDER — HYDRALAZINE HCL 20 MG/ML IJ SOLN
INTRAMUSCULAR | Status: AC
  Filled 2023-04-03: qty 1

## 2023-04-03 MED ORDER — SODIUM CHLORIDE 0.9% FLUSH: 3.0000 mL | INTRAVENOUS | Status: DC | PRN

## 2023-04-03 MED ORDER — LIDOCAINE HCL (PF) 1 % IJ SOLN
INTRAMUSCULAR | Status: DC | PRN
  Administered 2023-04-03: 15 mL
  Administered 2023-04-03 (×2): 2 mL

## 2023-04-03 MED ORDER — LABETALOL HCL 5 MG/ML IV SOLN
INTRAVENOUS | Status: DC | PRN
  Administered 2023-04-03 (×2): 5 mg via INTRAVENOUS

## 2023-04-03 MED ORDER — LABETALOL HCL 5 MG/ML IV SOLN: 10.0000 mg | INTRAVENOUS | Status: AC | PRN

## 2023-04-03 MED ORDER — IOHEXOL 350 MG/ML SOLN
INTRAVENOUS | Status: DC | PRN
  Administered 2023-04-03: 30 mL

## 2023-04-03 MED ORDER — VERAPAMIL HCL 2.5 MG/ML IV SOLN: INTRAVENOUS | Status: DC | PRN

## 2023-04-03 MED ORDER — ENOXAPARIN SODIUM 40 MG/0.4ML IJ SOSY
40.0000 mg | PREFILLED_SYRINGE | INTRAMUSCULAR | Status: DC
  Administered 2023-04-04 – 2023-04-05 (×2): 40 mg via SUBCUTANEOUS
  Filled 2023-04-03 (×2): qty 0.4

## 2023-04-03 SURGICAL SUPPLY — 14 items
BAG SNAP BAND KOVER 36X36 (MISCELLANEOUS) IMPLANT
CATH INFINITI 5FR MULTPACK ANG (CATHETERS) IMPLANT
DEVICE CLOSURE MYNXGRIP 5F (Vascular Products) IMPLANT
GLIDESHEATH SLEND SS 6F .021 (SHEATH) IMPLANT
GUIDEWIRE INQWIRE 1.5J.035X260 (WIRE) IMPLANT
INQWIRE 1.5J .035X260CM (WIRE)
PACK CARDIAC CATHETERIZATION (CUSTOM PROCEDURE TRAY) ×1 IMPLANT
SET ATX-X65L (MISCELLANEOUS) IMPLANT
SHEATH GLIDE SLENDER 4/5FR (SHEATH) IMPLANT
SHEATH PINNACLE 5F 10CM (SHEATH) IMPLANT
SHEATH PROBE COVER 6X72 (BAG) IMPLANT
WIRE EMERALD 3MM-J .035X150CM (WIRE) IMPLANT
WIRE MICRO SET SILHO 5FR 7 (SHEATH) IMPLANT
WIRE MICROINTRODUCER 60CM (WIRE) IMPLANT

## 2023-04-03 NOTE — TOC Initial Note (Signed)
 Transition of Care [facility]) - Initial/Assessment Note    Patient Details  Name: Kaitlyn Serrano MRN: [id number] Date of Birth: 1940-04-02  [facility]) CM/SW Contact:    Debarah Saunas, RN Phone Number: 04/03/2023, 9:46 AM  Clinical Narrative:                 RNCM met with pt and daughter, Jon at bedside.  Pt was having cardiac ultrasound and gave permission for daughter to discuss discharge planning.  Jon states that pt is mostly independent at home until she gets dizzy.  Jon and pt would like home health services arranged at discharge.  TOC will continue to follow.  Expected Discharge Plan: Home w Home Health Services Barriers to Discharge: Continued Medical Work up   Patient Goals and CMS Choice Patient states their goals for this hospitalization and ongoing recovery are:: find out what's going on so I can go home.          Expected Discharge Plan and Services   Discharge Planning Services: CM Consult Post Acute Care Choice: Home Health Living arrangements for the past 2 months: Single Family Home                                      Prior Living Arrangements/Services Living arrangements for the past 2 months: Single Family Home Lives with:: Adult Children          Need for Family Participation in Patient Care: Yes (Comment) Care giver support system in place?: Yes (comment)   Criminal Activity/Legal Involvement Pertinent to Current Situation/Hospitalization: No - Comment as needed  Activities of Daily Living      Permission Sought/Granted                  Emotional Assessment Appearance:: Appears stated age, Well-Groomed Attitude/Demeanor/Rapport: Engaged, Gracious Affect (typically observed): Accepting, Pleasant Orientation: : Oriented to Place, Oriented to  Time, Oriented to Situation Alcohol / Substance Use: Tobacco Use (former) Psych Involvement: No (comment)  Admission diagnosis:  Chest pain [R07.9] Patient Active  Problem List   Diagnosis Date Noted   Near syncope 04/02/2023   AKI (acute kidney injury) (HCC) 04/02/2023   QT prolongation 04/02/2023   Elevated troponin 04/02/2023   Chest pain 04/01/2023   Acute head injury 09/19/2022   Acute pain of left knee 09/19/2022   Fall 09/19/2022   Acute nonintractable headache 09/19/2022   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   Moderate episode of recurrent major depressive disorder (HCC) 03/27/2020   Sinus congestion 03/27/2020   S/P right THA, AA 06/08/2019   Right hip pain 11/03/2018   Overweight (BMI 25.0-29.9) 07/10/2018   High risk medication use 12/30/2017   Prediabetes 02/12/2017   Major depression 12/02/2016   Constipation 01/16/2015   Insomnia 01/16/2015   GAD (generalized anxiety disorder) 10/08/2013   Vitamin D  deficiency 10/08/2013   GERD (gastroesophageal reflux disease) 10/08/2013   Hypertension 08/13/2012   Hyperlipemia 08/13/2012   Seasonal allergic rhinitis 06/30/2012   Hemorrhoids 06/25/2011   PCP:  [name], DO Pharmacy:   [facility] [phone] - [place], [place] - [address] [place] [address] Phone: [phone] Fax: [phone]  [facility] [place], [place] - [address] [place] [address] Phone: [phone] Fax: [phone]  [facility] - [place], [place] - [address] [place] [address] Phone: [phone] Fax: [phone]     Social Drivers of Health (SDOH) Social History: SDOH Screenings   Food Insecurity: No Food Insecurity (07/11/2022)  Housing: Low Risk  (07/11/2022)  Transportation Needs: No Transportation Needs (07/11/2022)  Utilities: Not At Risk (07/11/2022)  Alcohol Screen: Low Risk  (07/11/2022)  Depression (PHQ2-9): Medium Risk (01/16/2023)  Financial Resource Strain: Low Risk  (07/11/2022)  Physical Activity: Insufficiently Active (07/11/2022)  Social Connections:  Moderately Isolated (07/11/2022)  Stress: No Stress Concern Present (07/11/2022)  Tobacco Use: Medium Risk (04/01/2023)   SDOH Interventions:     Readmission Risk Interventions     No data to display

## 2023-04-03 NOTE — Progress Notes (Signed)
 Physical Therapy Treatment Patient Details Name: Kaitlyn Serrano MRN: [id number] DOB: 08-03-1940 Today's Date: 04/03/2023   History of Present Illness Pt is an 83 year old woman who presented to the ED on 04/01/23 with chest discomfort and near syncope. Pt with elevated troponins, negative for orthostatic hypotension. PMH: dementia, HTN, HLD, anxiety, depression, chronic pain, DM, GERD.    PT Comments  Pt admitted with above diagnosis. Pt continues to ambulate at times independently in room with nurse aware.  Pt does well in controlled environment. Pt has more difficulty in uncontrolled and distracting environement.  Will benefit from HHPT f/u and pt again encouraged to use RW at all times for safety as she does better with safety with a device.   Pt currently with functional limitations due to the deficits listed below (see PT Problem List). Pt will benefit from acute skilled PT to increase their independence and safety with mobility to allow discharge.       If plan is discharge home, recommend the following: Assistance with cooking/housework;Assist for transportation;Help with stairs or ramp for entrance   Can travel by private vehicle        Equipment Recommendations  None recommended by PT    Recommendations for Other Services       Precautions / Restrictions Precautions Precautions: Fall Restrictions Weight Bearing Restrictions Per Provider Order: No     Mobility  Bed Mobility               General bed mobility comments: Pt came to door and asked PT to hook her cables back up as she had just been to the bathroom.    Transfers Overall transfer level: Needs assistance Equipment used: None Transfers: Sit to/from Stand Sit to Stand: Supervision           General transfer comment: No assist needed to come to standing.    Ambulation/Gait Ambulation/Gait assistance: Contact guard assist Gait Distance (Feet): 450 Feet Assistive device: None, Rolling walker (2  wheels) Gait Pattern/deviations: Step-through pattern, Decreased stride length   Gait velocity interpretation: 1.31 - 2.62 ft/sec, indicative of limited community ambulator   General Gait Details: Pt was able to ambulate without device with overall fair stabiity and no significant LOB without challenges. Pt continues to have balance issues with challenges.  Continue to encourage pt to use her RW for safety at home and pt agrees.  Pt aware of her challenges in uncontrolled environment.   Stairs             Wheelchair Mobility     Tilt Bed    Modified Rankin (Stroke Patients Only)       Balance Overall balance assessment: Needs assistance Sitting-balance support: No upper extremity supported, Feet supported Sitting balance-Leahy Scale: Good     Standing balance support: Bilateral upper extremity supported, During functional activity, No upper extremity supported Standing balance-Leahy Scale: Fair Standing balance comment: Can stand statically without UE support and also can do some activities dynamically without Ue support.                            Cognition Arousal: Alert Behavior During Therapy: WFL for tasks assessed/performed Overall Cognitive Status: History of cognitive impairments - at baseline  Exercises General Exercises - Lower Extremity Hip Flexion/Marching: AROM, Both, 10 reps, Standing Toe Raises: AROM, Both, 10 reps, Standing    General Comments General comments (skin integrity, edema, etc.): VSS      Pertinent Vitals/Pain Pain Assessment Pain Assessment: Faces Faces Pain Scale: Hurts even more Pain Location: back--chronic Pain Descriptors / Indicators: Grimacing, Guarding, Discomfort, Aching Pain Intervention(s): Limited activity within patient's tolerance, Monitored during session, Repositioned    Home Living Family/patient expects to be discharged to:: Private  residence Living Arrangements: Children (daughter)                      Prior Function            PT Goals (current goals can now be found in the care plan section) Acute Rehab PT Goals Patient Stated Goal: to go home Progress towards PT goals: Progressing toward goals    Frequency    Min 1X/week      PT Plan      Co-evaluation              AM-PAC PT 6 Clicks Mobility   Outcome Measure  Help needed turning from your back to your side while in a flat bed without using bedrails?: None Help needed moving from lying on your back to sitting on the side of a flat bed without using bedrails?: None Help needed moving to and from a bed to a chair (including a wheelchair)?: A Little Help needed standing up from a chair using your arms (e.g., wheelchair or bedside chair)?: A Little Help needed to walk in hospital room?: A Little Help needed climbing 3-5 steps with a railing? : A Little 6 Click Score: 20    End of Session Equipment Utilized During Treatment: Gait belt Activity Tolerance: Patient tolerated treatment well Patient left: with call bell/phone within reach (on stretcher) Nurse Communication: Mobility status PT Visit Diagnosis: Muscle weakness (generalized) (M62.81)     Time: 9040-8989 PT Time Calculation (min) (ACUTE ONLY): 11 min  Charges:    $Gait Training: 8-22 mins PT General Charges $$ ACUTE PT VISIT: 1 Visit                     Lydiah Pong M,PT Acute Rehab Services [phone]    Kaitlyn Serrano 04/03/2023, 11:30 AM

## 2023-04-03 NOTE — Interval H&P Note (Signed)
 History and Physical Interval Note:  04/03/2023 4:28 PM  Kaitlyn Serrano  has presented today for surgery, with the diagnosis of NSTEMI.  The various methods of treatment have been discussed with the patient and family. After consideration of risks, benefits and other options for treatment, the patient has consented to  Procedure(s): LEFT HEART CATH AND CORONARY ANGIOGRAPHY (N/A) as a surgical intervention.  The patient's history has been reviewed, patient examined, no change in status, stable for surgery.  I have reviewed the patient's chart and labs.  Questions were answered to the patient's satisfaction.    Cath Lab Visit (complete for each Cath Lab visit)  Clinical Evaluation Leading to the Procedure:   ACS: Yes.    Non-ACS:  N/A  Marche Hottenstein

## 2023-04-03 NOTE — Plan of Care (Signed)

## 2023-04-03 NOTE — Progress Notes (Addendum)
 Rounding Note    Patient Name: Kaitlyn Serrano Date of Encounter: 04/03/2023  [facility] Cardiologist: Alvan Carrier, MD   Subjective   Feels great today- no complaints. Echo personally reviewed this morning, the LVEF is decreased, probably 40-45% with LCX territory hypokinesis. 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   Inpatient Medications    Scheduled Meds:  busPIRone   20 mg Oral BID   donepezil   10 mg Oral QHS   enoxaparin  (LOVENOX ) injection  30 mg Subcutaneous Q24H   ezetimibe   10 mg Oral Daily   mirtazapine   15 mg Oral QHS   pantoprazole   40 mg Oral BID   potassium chloride   40 mEq Oral BID   Continuous Infusions:   PRN Meds: acetaminophen  **OR** acetaminophen , HYDROcodone -acetaminophen , perflutren  lipid microspheres (DEFINITY ) IV suspension   Vital Signs    Vitals:   04/03/23 0500 04/03/23 0600 04/03/23 0706 04/03/23 0928  BP: (!) 153/84 (!) 166/78  (!) 166/67  Pulse: 78 77  (!) 101  Resp: 16 17  17   Temp:   97.8 F (36.6 C)   TempSrc:   Oral   SpO2: 97% 96%  100%  Weight:      Height:        Intake/Output Summary (Last 24 hours) at 04/03/2023 1018 Last data filed at 04/02/2023 2152 Gross per 24 hour  Intake 1195 ml  Output --  Net 1195 ml      04/01/2023    4:47 PM 02/11/2023   11:13 AM 01/16/2023    2:20 PM  Last 3 Weights  Weight (lbs) 125 lb 117 lb 6.4 oz 116 lb 6.4 oz  Weight (kg) 56.7 kg 53.252 kg 52.799 kg      Telemetry    Normal sinus rhythm - Personally Reviewed  ECG    N/A  Physical Exam   GEN: Thin Caucasian female resting comfortably in no acute distress.   Neck: No JVD. Cardiac: RRR. No murmurs, rubs, or gallops.  Respiratory: Clear to auscultation bilaterally. No wheezes, rhonchi, or rales. MS: No lower extremity edema. No deformity. Neuro:  No focal deficits. Psych: Normal affect. Responds appropriately.  Labs    High Sensitivity Troponin:   Recent Labs  Lab 04/01/23 1654 04/01/23 1906  TROPONINIHS  746* 418*     Chemistry Recent Labs  Lab 04/01/23 1654 04/01/23 1709 04/02/23 0425 04/03/23 0421  NA 140 140 138 137  K 3.7 3.7 3.6 3.4*  CL 106 106 107 107  CO2 24  --  22 22  GLUCOSE 111* 108* 93 101*  BUN 21 24* 17 10  CREATININE 1.02* 1.10* 0.70 0.63  CALCIUM 8.7*  --  8.8* 8.7*  MG  --   --  1.8 1.9  PROT 5.9*  --   --  5.6*  ALBUMIN  3.1*  --   --  2.8*  AST 34  --   --  26  ALT 16  --   --  13  ALKPHOS 54  --   --  48  BILITOT 0.5  --   --  1.1  GFRNONAA 55*  --  >60 >60  ANIONGAP 10  --  9 8    Lipids  Recent Labs  Lab 04/03/23 0421  CHOL 151  TRIG 77  HDL 56  LDLCALC 80  CHOLHDL 2.7    Hematology Recent Labs  Lab 04/01/23 1654 04/01/23 1709 04/02/23 0425 04/03/23 0421  WBC 11.4*  --  10.7* 9.3  RBC  4.26  --  3.84* 4.01  HGB 13.3 13.3 11.8* 12.5  HCT 39.7 39.0 35.7* 36.5  MCV 93.2  --  93.0 91.0  MCH 31.2  --  30.7 31.2  MCHC 33.5  --  33.1 34.2  RDW 13.3  --  13.4 13.0  PLT 215  --  190 174   Thyroid  No results for input(s): TSH, FREET4 in the last 168 hours.  BNPNo results for input(s): BNP, PROBNP in the last 168 hours.  DDimer No results for input(s): DDIMER in the last 168 hours.   Radiology    CT Head Wo Contrast Result Date: 04/01/2023 CLINICAL DATA:  Syncope/presyncope, cerebrovascular cause suspected EXAM: CT HEAD WITHOUT CONTRAST TECHNIQUE: Contiguous axial images were obtained from the base of the skull through the vertex without intravenous contrast. RADIATION DOSE REDUCTION: This exam was performed according to the departmental dose-optimization program which includes automated exposure control, adjustment of the mA and/or kV according to patient size and/or use of iterative reconstruction technique. COMPARISON:  09/19/2022 FINDINGS: Brain: Age related volume loss. Chronic small-vessel ischemic changes the white matter. No sign of acute infarction, mass lesion, hemorrhage, hydrocephalus or extra-axial collection. Vascular:  There is atherosclerotic calcification of the major vessels at the base of the brain. Skull: Negative Sinuses/Orbits: Clear/normal Other: None IMPRESSION: No acute CT finding. Age related volume loss. Chronic small-vessel ischemic changes of the white matter. Electronically Signed   By: Oneil Officer M.D.   On: 04/01/2023 20:02   CT Angio Chest PE W and/or Wo Contrast Result Date: 04/01/2023 CLINICAL DATA:  Syncope/presyncope, cerebrovascular cause suspected. Chest pain. Possible pulmonary embolism. EXAM: CT ANGIOGRAPHY CHEST WITH CONTRAST TECHNIQUE: Multidetector CT imaging of the chest was performed using the standard protocol during bolus administration of intravenous contrast. Multiplanar CT image reconstructions and MIPs were obtained to evaluate the vascular anatomy. RADIATION DOSE REDUCTION: This exam was performed according to the departmental dose-optimization program which includes automated exposure control, adjustment of the mA and/or kV according to patient size and/or use of iterative reconstruction technique. CONTRAST:  65mL OMNIPAQUE  IOHEXOL  350 MG/ML SOLN COMPARISON:  12/09/2015 CT.  Chest radiography same day. FINDINGS: Cardiovascular: Heart size upper limits of normal. No right ventricular enlargement. No pericardial fluid. Some coronary artery calcification and aortic atherosclerotic calcification are present. Pulmonary arterial opacification is good. There are no pulmonary emboli. Mediastinum/Nodes: No mediastinal or hilar mass or lymphadenopathy. Lungs/Pleura: Mild dependent atelectasis. No pleural effusion. No acute pneumonia. No mass or nodule. Upper Abdomen: No acute or significant finding. Musculoskeletal: Previous thoracolumbar fusion. Some degenerative change of the disc levels above fusion. No evidence of regional acute fracture. Review of the MIP images confirms the above findings. IMPRESSION: 1. No pulmonary emboli. 2. Coronary artery calcification. Aortic atherosclerotic  calcification. 3. Previous thoracolumbar fusion. Some degenerative change of the disc levels above fusion. Electronically Signed   By: Oneil Officer M.D.   On: 04/01/2023 20:00   DG Chest Port 1 View Result Date: 04/01/2023 CLINICAL DATA:  Chest pain EXAM: PORTABLE CHEST 1 VIEW COMPARISON:  02/17/2020 FINDINGS: Stable cardiomediastinal silhouette. Aortic atherosclerotic calcification. Chronic bronchitic changes are similar to prior. No focal pneumonia, pleural effusion, or pneumothorax. Remote left rib fractures. Cervical and thoracolumbar fusion hardware. IMPRESSION: No active disease. Electronically Signed   By: Norman Gatlin M.D.   On: 04/01/2023 17:30    Cardiac Studies   Myoview 06/07/2014: Impression: 1. No reversible ischemia or infarction. 2. Normal left ventricular wall motion. 3. Left ventricular ejection fraction 57% 4.  Low-risk stress test findings*. _______________  Echocardiogram 09/08/2014: Study Conclusions: - Left ventricle: The cavity size was normal. Wall thickness was    normal. Systolic function was normal. The estimated ejection    fraction was in the range of 60% to 65%. Wall motion was normal;    there were no regional wall motion abnormalities. Doppler    parameters are consistent with abnormal left ventricular    relaxation (grade 1 diastolic dysfunction).  - Aortic valve: Mildly calcified annulus. Trileaflet.  - Mitral valve: Mildly calcified annulus. Mildly calcified leaflets. There was mild regurgitation.  - Tricuspid valve: There was mild regurgitation.    Patient Profile     83 y.o. female with a history of chest pain with negative Myoview in 2012 and 2016, palpitations, hypertension, hyperlipidemia, type 2 diabetes mellitus, GERD, anxiety/ depression, chronic pain syndrome, and moderate dementia who presented on 04/01/2023 for further evaluation of chest pain and near syncope. Cardiology consulted for further evaluation.   Assessment & Plan    Chest  Pain Elevated Troponin Patient presented with atypical chest pain that was reproducible with palpation as well as near syncope. However, she also reported intermittent chest pain and shortness of breath when walking over the last week. EKG showed isolated ST elevation in V2 and new T wave inversions in I and aVL.  High-sensitivity troponin 746 >> 418.  Chest CTA was negative for PE but did show coronary artery calcifications. - Patient denies having any chest pain to me. She has underlying dementia so history is difficult.  - Echo personally reviewed - formal report pending, but LVEF is lower, probably 40-45% with LCx territory WMA - Continue Aspirin  81mg  daily. - History of statin allergy (caused peeling of the skin). Continue Zetia  10mg  daily. LDL 80 - would consider outpatient [id number]. - Given regional WMA's and reduced LVEF, would recommend LHC - I discussed the procedure with her daughters Kaitlyn Serrano & Kaitlyn Serrano today (she is the POA), including the risks, benefits and alternatives of cardiac catheterization and she is agreeable to proceed. They both provided verbal consent.    Informed Consent   Shared Decision Making/Informed Consent The risks [stroke (1 in 1000), death (1 in 1000), kidney failure [usually temporary] (1 in 500), bleeding (1 in 200), allergic reaction [possibly serious] (1 in 200)], benefits (diagnostic support and management of coronary artery disease) and alternatives of a cardiac catheterization were discussed in detail with Kaitlyn Serrano and she is willing to proceed.    Near Syncope Patient describe possible syncopal episode that occurred after getting up from a chair and walking. She thinks she may of had LOC for a little. She states she had associated palpitations, lightheadedness, and a loud sensation her head like a bell was ringing. She received some IV fluids in the ED. - Hemodynamically stable. - Echo pending. - No significant arrhythmias on telemetry. - Will check  orthostatic vital signs. If negative, can consider consider outpatient monitor.  Hypertension BP mildly elevated. - Will check orthostatic vital signs. If normal, can start to resume home medications.  Hyperlipidemia - History of statin allergy (caused peeling of the skin). Continue Zetia  10mg  daily. - Will check fasting lipid panel tomorrow morning.  AKI Creatinine 1.02 on admission. Baseline around 6.8. - Improved after IV fluids. Creatinine 0.7 this morning.  CODE STATUS - Discussed with daughter Kaitlyn Serrano Bar (former nurse) on the phone and it is already known that Kaitlyn Serrano wants to be DNR.  Will update code status. Also, discussed with daughter  Kaitlyn Serrano (who is the power of attorney) and she is also agreeable to the procedure.   Otherwise, per primary team: - Type 2 diabetes mellitus - Anxiety/ depression - Chronic pain - Dementia  For questions or updates, please contact [facility] Please consult [email or web address] for contact info under   Vinie KYM Maxcy, MD, CODY CALANDRA  [place]  [facility]  Medical Director of the [facility] Diplomate of the American Board of Clinical Lipidology Attending Cardiologist  Direct Dial: [phone]  Fax: [phone]  Website:  [email or web address]  Vinie JAYSON Maxcy, MD  04/03/2023, 10:18 AM

## 2023-04-03 NOTE — Progress Notes (Addendum)
 PROGRESS NOTE    Kaitlyn Serrano  FMW:[id number] DOB: [birth year] DOA: 04/01/2023 PCP: [name], DO   Brief Narrative:  The patient is an 83 year old elderly Caucasian female past medical history significant for Mann[place] to hypertension, hyperlipidemia, GERD, anxiety, depression, chronic back pain, mild dementia as well as other comorbidities who presented to the ED via EMS for evaluation of right-sided chest discomfort and near syncope.  She took 4 baby aspirin 's prior to arrival and EKG on admission showed new ST elevations V2 and T wave inversion in lead I and aVL.  Cardiology was consulted and reviewed the EKG and felt is not consistent with a STEMI.  She states that she was watching television afternoon and got up to walk to the bedroom with all of a sudden she started feeling dizzy and lightheaded and felt like she is in a pass out.  She denied experiencing any chest pain, pressure, discomfort throughout the day or now and denies shortness of breath but has been having intermittent shortness breath and chest discomfort over the last week or so.  Currently she is resting comfortably.  Given her concerns she presented to the ED and she had a CTA of the chest was negative for PE but did show coronary artery calcification.  CT of the head was negative for any acute abnormalities.  Cardiology was consulted and recommended holding of IV heparin  given her troponin is trending down and currently she is undergoing further workup and echocardiogram is ordered and pending orthostatic vital signs and PT and OT evaluation.  Cardiogram has been done and pending read and labs have improved from yesterday but her potassium is low so we will replete.  Assessment and Plan:  Atypical Chest Discomfort r/o ACS Elevated Troponin -Presented to the ED via EMS for Right sided CP and Near Syncope -Took 4 baby ASA prior to Arrival and will contine ASA 81 mg po Daily  -EKG showing new ST elevations in V2 and T  wave inversions in leads I and aVL.  -Cardiology was consulted and reviewed the EKG and felt that it was not consistent with STEMI. -Troponin Trending down and went from 746 -> 418 -Cardiology recommending Holding Heparin  gtt at this time -CTA done and showed No pulmonary emboli. Coronary artery calcification. Aortic atherosclerotic calcification. Previous thoracolumbar fusion. Some degenerative change of the disc levels above fusion -Cardiology feels she could have had a Cardiac event with her intermittent CP and SOB that started a week ago and recommending ECHO and then deciding on what type of ischemic workup is indicated; ECHO is done and read is pending   Near Syncope -Patient presented to the ED via EMS for evaluation of right-sided chest pain and near syncope.  She took 4 baby aspirin 's prior to arrival.  Troponin elevated but trended down (746> 418) and not consistent with ACS.   -Cardiology recommended holding off starting IV heparin .   -WBC Trend: Recent Labs  Lab 04/01/23 1654 04/02/23 0425 04/03/23 0421  WBC 11.4* 10.7* 9.3  -CTA chest negative for PE but showing coronary artery calcification.   -CT head was also done for evaluation of near syncope and is negative for acute intracranial abnormality.   -She has no focal neurodeficit on exam and currently patient denies experiencing any chest pain/pressure/discomfort.   -Continue cardiac monitoring and echocardiogram ordered and pending.  -She was in sinus tachycardia this morning -Fall precautions. -No longer getting IVF Hydration with NS at 75 mL/hr as this has stopped -ECHOCardiogram pending  Read -Cardiology considering outpatient Monitor if Orthostatic VS Negative; Check Orthostatic VS again this AM -PT OT evaluated and recommending no follow-up   AKI, improved -Mild and improved. Creatinine baseline appears to be 0.6-0.7.   -BUN/Cr Trend: Recent Labs  Lab 04/01/23 1654 04/01/23 1709 04/02/23 0425 04/03/23 0421   BUN 21 24* 17 10  CREATININE 1.02* 1.10* 0.70 0.63  -C/w Gentle IV fluid hydration with NS at 75 mL/hr x12 hours and monitor renal function.  -Avoid Nephrotoxic Medications and currently holding home Lisinopril , Contrast Dyes, Hypotension and Dehydration to Ensure Adequate Renal Perfusion and will need to Renally Adjust Meds -Continue to Monitor and Trend Renal Function carefully and repeat CMP in the AM   QT prolongation -Monitor potassium and magnesium  levels and repleting so K+ is >4.0 and Mg is > 2.0  -Avoid QT prolonging drugs and follow-up repeat EKG in the morning.  Hypokalemia -Patient's K+ Level Trend: Recent Labs  Lab 04/01/23 1654 04/01/23 1709 04/02/23 0425 04/03/23 0421  K 3.7 3.7 3.6 3.4*  -Replete with po KCL 40 mEQ BID x2 -Continue to Monitor and Replete as Necessary -Repeat CMP in the AM    Hypertension -Holding antihypertensives at this time and check orthostatics. -Continue to Monitor BP per Protocol -Per Cardiology if Orthostatic VS normal can resume home Medications  -Last BP reading was elevated 166/67   Hyperlipidemia -Has Hx of Statin Allergy which caused peeling of the Skin -Cardiology obtained FLP this AM and showed a total cholesterol/HDL ratio of 2.7, cholesterol 151, HDL 56, LDL of 80, triglycerides of 77, VLDL of 15 -C/w Ezetimibe  10 mg po Daily   Leukocytosis -Likely Reactive and improved -WBC Trend as above -Continue to Monitor for S/Sx of Infection -Repeat CBC in the AM  GERD/GI Prophylaxis  -Resume PPI with Pantoprazole  40 mg BID x2  Anxiety and Depression -C/w Buspirone  20 mg po BID and Mirtazepine 15 mg po qHS  Chronic Back Pain -Currently holding Tizanidine 2 mg po BID and Cylobenzaprine 10 mg po TIDprn Muscle Spasms -Resume Home Hydrocodone -Acetaminophen  10-325 mg po q4hprn  Dementia -C/w Delirium precautions. -Resume Home Donepezil  10 mg po at bedtime  Normocytic Anemia, improved -Hgb/Hct Trend: Recent Labs  Lab  04/01/23 1654 04/01/23 1709 04/02/23 0425 04/03/23 0421  HGB 13.3 13.3 11.8* 12.5  HCT 39.7 39.0 35.7* 36.5  MCV 93.2  --  93.0 91.0  -Check Anemia Panel in the AM -Continue to Monitor for S/Sx of Bleeding; No overt bleeding noted -Repeat CBC in the AM  Hypoalbuminemia -Patient's Albumin  Trend: Recent Labs  Lab 04/01/23 1654 04/03/23 0421  ALBUMIN  3.1* 2.8*  -Continue to Monitor and Trend and repeat CMP in the AMDVT prophylaxis: enoxaparin  (LOVENOX ) injection 30 mg Start: 04/02/23 1000    Code Status: Full Code Family Communication: No family present at bedside  Disposition Plan:  Level of care: Progressive Status is: Observation The patient will require care spanning > 2 midnights and should be moved to inpatient because: Needs further Cardiac Clearance and further care to be determined based on ECHO findings   Consultants:  Cardiology  Procedures:  ECHOCARDIOGRAM (done and pending read)  Antimicrobials:  Anti-infectives (From admission, onward)    None       Subjective: Seen and examined at bedside and was confused morning but continues to complain of some chest discomfort.  States that she is having issues with the nurses as well.  No nausea or vomiting.  Feels okay otherwise but states that she had a rough  morning.  Objective: Vitals:   04/03/23 0500 04/03/23 0600 04/03/23 0706 04/03/23 0928  BP: (!) 153/84 (!) 166/78  (!) 166/67  Pulse: 78 77  (!) 101  Resp: 16 17  17   Temp:   97.8 F (36.6 C)   TempSrc:   Oral   SpO2: 97% 96%  100%  Weight:      Height:        Intake/Output Summary (Last 24 hours) at 04/03/2023 1025 Last data filed at 04/02/2023 2152 Gross per 24 hour  Intake 1195 ml  Output --  Net 1195 ml   Filed Weights   04/01/23 1647  Weight: 56.7 kg   Examination: Physical Exam:  Constitutional: Elderly pleasantly confused Caucasian female in no acute distress Respiratory: Diminished to auscultation bilaterally, no wheezing, rales,  rhonchi or crackles. Normal respiratory effort and patient is not tachypenic. No accessory muscle use.  Cardiovascular: Tachycardic rate but regular rhythm, no murmurs / rubs / gallops. S1 and S2 auscultated.  No appreciable extremity edema Abdomen: Soft, non-tender, non-distended. . Bowel sounds positive.  GU: Deferred. Musculoskeletal: No clubbing / cyanosis of digits/nails. No joint deformity upper and lower extremities.  Skin: No rashes, lesions, ulcers on limited skin evaluation. No induration; Warm and dry.  Neurologic: CN 2-12 grossly intact with no focal deficits.  Romberg sign cerebellar reflexes not assessed.  Psychiatric: Impaired judgment and insight.  She is awake and alert but pleasantly confused and not fully oriented  Data Reviewed: I have personally reviewed following labs and imaging studies  CBC: Recent Labs  Lab 04/01/23 1654 04/01/23 1709 04/02/23 0425 04/03/23 0421  WBC 11.4*  --  10.7* 9.3  NEUTROABS 8.9*  --   --  6.6  HGB 13.3 13.3 11.8* 12.5  HCT 39.7 39.0 35.7* 36.5  MCV 93.2  --  93.0 91.0  PLT 215  --  190 174   Basic Metabolic Panel: Recent Labs  Lab 04/01/23 1654 04/01/23 1709 04/02/23 0425 04/03/23 0421  NA 140 140 138 137  K 3.7 3.7 3.6 3.4*  CL 106 106 107 107  CO2 24  --  22 22  GLUCOSE 111* 108* 93 101*  BUN 21 24* 17 10  CREATININE 1.02* 1.10* 0.70 0.63  CALCIUM 8.7*  --  8.8* 8.7*  MG  --   --  1.8 1.9  PHOS  --   --   --  2.8   GFR: Estimated Creatinine Clearance: 42.9 mL/min (by C-G formula based on SCr of 0.63 mg/dL). Liver Function Tests: Recent Labs  Lab 04/01/23 1654 04/03/23 0421  AST 34 26  ALT 16 13  ALKPHOS 54 48  BILITOT 0.5 1.1  PROT 5.9* 5.6*  ALBUMIN  3.1* 2.8*   No results for input(s): LIPASE, AMYLASE in the last 168 hours. No results for input(s): AMMONIA in the last 168 hours. Coagulation Profile: No results for input(s): INR, PROTIME in the last 168 hours. Cardiac Enzymes: No results for  input(s): CKTOTAL, CKMB, CKMBINDEX, TROPONINI in the last 168 hours. BNP (last 3 results) No results for input(s): PROBNP in the last 8760 hours. HbA1C: No results for input(s): HGBA1C in the last 72 hours. CBG: No results for input(s): GLUCAP in the last 168 hours. Lipid Profile: Recent Labs    04/03/23 0421  CHOL 151  HDL 56  LDLCALC 80  TRIG 77  CHOLHDL 2.7   Thyroid  Function Tests: No results for input(s): TSH, T4TOTAL, FREET4, T3FREE, THYROIDAB in the last 72 hours. Anemia Panel: No  results for input(s): VITAMINB12, FOLATE, FERRITIN, TIBC, IRON, RETICCTPCT in the last 72 hours. Sepsis Labs: No results for input(s): PROCALCITON, LATICACIDVEN in the last 168 hours.  No results found for this or any previous visit (from the past 240 hours).   Radiology Studies: CT Head Wo Contrast Result Date: 04/01/2023 CLINICAL DATA:  Syncope/presyncope, cerebrovascular cause suspected EXAM: CT HEAD WITHOUT CONTRAST TECHNIQUE: Contiguous axial images were obtained from the base of the skull through the vertex without intravenous contrast. RADIATION DOSE REDUCTION: This exam was performed according to the departmental dose-optimization program which includes automated exposure control, adjustment of the mA and/or kV according to patient size and/or use of iterative reconstruction technique. COMPARISON:  09/19/2022 FINDINGS: Brain: Age related volume loss. Chronic small-vessel ischemic changes the white matter. No sign of acute infarction, mass lesion, hemorrhage, hydrocephalus or extra-axial collection. Vascular: There is atherosclerotic calcification of the major vessels at the base of the brain. Skull: Negative Sinuses/Orbits: Clear/normal Other: None IMPRESSION: No acute CT finding. Age related volume loss. Chronic small-vessel ischemic changes of the white matter. Electronically Signed   By: Oneil Officer M.D.   On: 04/01/2023 20:02   CT Angio Chest PE W  and/or Wo Contrast Result Date: 04/01/2023 CLINICAL DATA:  Syncope/presyncope, cerebrovascular cause suspected. Chest pain. Possible pulmonary embolism. EXAM: CT ANGIOGRAPHY CHEST WITH CONTRAST TECHNIQUE: Multidetector CT imaging of the chest was performed using the standard protocol during bolus administration of intravenous contrast. Multiplanar CT image reconstructions and MIPs were obtained to evaluate the vascular anatomy. RADIATION DOSE REDUCTION: This exam was performed according to the departmental dose-optimization program which includes automated exposure control, adjustment of the mA and/or kV according to patient size and/or use of iterative reconstruction technique. CONTRAST:  65mL OMNIPAQUE  IOHEXOL  350 MG/ML SOLN COMPARISON:  12/09/2015 CT.  Chest radiography same day. FINDINGS: Cardiovascular: Heart size upper limits of normal. No right ventricular enlargement. No pericardial fluid. Some coronary artery calcification and aortic atherosclerotic calcification are present. Pulmonary arterial opacification is good. There are no pulmonary emboli. Mediastinum/Nodes: No mediastinal or hilar mass or lymphadenopathy. Lungs/Pleura: Mild dependent atelectasis. No pleural effusion. No acute pneumonia. No mass or nodule. Upper Abdomen: No acute or significant finding. Musculoskeletal: Previous thoracolumbar fusion. Some degenerative change of the disc levels above fusion. No evidence of regional acute fracture. Review of the MIP images confirms the above findings. IMPRESSION: 1. No pulmonary emboli. 2. Coronary artery calcification. Aortic atherosclerotic calcification. 3. Previous thoracolumbar fusion. Some degenerative change of the disc levels above fusion. Electronically Signed   By: Oneil Officer M.D.   On: 04/01/2023 20:00   DG Chest Port 1 View Result Date: 04/01/2023 CLINICAL DATA:  Chest pain EXAM: PORTABLE CHEST 1 VIEW COMPARISON:  02/17/2020 FINDINGS: Stable cardiomediastinal silhouette. Aortic  atherosclerotic calcification. Chronic bronchitic changes are similar to prior. No focal pneumonia, pleural effusion, or pneumothorax. Remote left rib fractures. Cervical and thoracolumbar fusion hardware. IMPRESSION: No active disease. Electronically Signed   By: Norman Gatlin M.D.   On: 04/01/2023 17:30   Scheduled Meds:  busPIRone   20 mg Oral BID   donepezil   10 mg Oral QHS   enoxaparin  (LOVENOX ) injection  30 mg Subcutaneous Q24H   ezetimibe   10 mg Oral Daily   mirtazapine   15 mg Oral QHS   pantoprazole   40 mg Oral BID   potassium chloride   40 mEq Oral BID   Continuous Infusions:   LOS: 0 days   Alejandro Marker, DO [facility] Available via Epic secure chat 7am-7pm After  these hours, please refer to coverage provider listed on [email or web address] 04/03/2023, 10:25 AM

## 2023-04-03 NOTE — Care Management Obs Status (Signed)
 MEDICARE OBSERVATION STATUS NOTIFICATION   Patient Details  Name: KALLEE NAM MRN: [id number] Date of Birth: Dec 08, 1940   Medicare Observation Status Notification Given:  Yes    Joanette Moynahan, RN 04/03/2023, 8:26 AM

## 2023-04-03 NOTE — H&P (View-Only) (Signed)
 Rounding Note    Patient Name: Kaitlyn Serrano Date of Encounter: 04/03/2023  [facility] Cardiologist: Alvan Carrier, MD   Subjective   Feels great today- no complaints. Echo personally reviewed this morning, the LVEF is decreased, probably 40-45% with LCX territory hypokinesis. 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   Inpatient Medications    Scheduled Meds:  busPIRone   20 mg Oral BID   donepezil   10 mg Oral QHS   enoxaparin  (LOVENOX ) injection  30 mg Subcutaneous Q24H   ezetimibe   10 mg Oral Daily   mirtazapine   15 mg Oral QHS   pantoprazole   40 mg Oral BID   potassium chloride   40 mEq Oral BID   Continuous Infusions:   PRN Meds: acetaminophen  **OR** acetaminophen , HYDROcodone -acetaminophen , perflutren  lipid microspheres (DEFINITY ) IV suspension   Vital Signs    Vitals:   04/03/23 0500 04/03/23 0600 04/03/23 0706 04/03/23 0928  BP: (!) 153/84 (!) 166/78  (!) 166/67  Pulse: 78 77  (!) 101  Resp: 16 17  17   Temp:   97.8 F (36.6 C)   TempSrc:   Oral   SpO2: 97% 96%  100%  Weight:      Height:        Intake/Output Summary (Last 24 hours) at 04/03/2023 1018 Last data filed at 04/02/2023 2152 Gross per 24 hour  Intake 1195 ml  Output --  Net 1195 ml      04/01/2023    4:47 PM 02/11/2023   11:13 AM 01/16/2023    2:20 PM  Last 3 Weights  Weight (lbs) 125 lb 117 lb 6.4 oz 116 lb 6.4 oz  Weight (kg) 56.7 kg 53.252 kg 52.799 kg      Telemetry    Normal sinus rhythm - Personally Reviewed  ECG    N/A  Physical Exam   GEN: Thin Caucasian female resting comfortably in no acute distress.   Neck: No JVD. Cardiac: RRR. No murmurs, rubs, or gallops.  Respiratory: Clear to auscultation bilaterally. No wheezes, rhonchi, or rales. MS: No lower extremity edema. No deformity. Neuro:  No focal deficits. Psych: Normal affect. Responds appropriately.  Labs    High Sensitivity Troponin:   Recent Labs  Lab 04/01/23 1654 04/01/23 1906  TROPONINIHS  746* 418*     Chemistry Recent Labs  Lab 04/01/23 1654 04/01/23 1709 04/02/23 0425 04/03/23 0421  NA 140 140 138 137  K 3.7 3.7 3.6 3.4*  CL 106 106 107 107  CO2 24  --  22 22  GLUCOSE 111* 108* 93 101*  BUN 21 24* 17 10  CREATININE 1.02* 1.10* 0.70 0.63  CALCIUM 8.7*  --  8.8* 8.7*  MG  --   --  1.8 1.9  PROT 5.9*  --   --  5.6*  ALBUMIN  3.1*  --   --  2.8*  AST 34  --   --  26  ALT 16  --   --  13  ALKPHOS 54  --   --  48  BILITOT 0.5  --   --  1.1  GFRNONAA 55*  --  >60 >60  ANIONGAP 10  --  9 8    Lipids  Recent Labs  Lab 04/03/23 0421  CHOL 151  TRIG 77  HDL 56  LDLCALC 80  CHOLHDL 2.7    Hematology Recent Labs  Lab 04/01/23 1654 04/01/23 1709 04/02/23 0425 04/03/23 0421  WBC 11.4*  --  10.7* 9.3  RBC  4.26  --  3.84* 4.01  HGB 13.3 13.3 11.8* 12.5  HCT 39.7 39.0 35.7* 36.5  MCV 93.2  --  93.0 91.0  MCH 31.2  --  30.7 31.2  MCHC 33.5  --  33.1 34.2  RDW 13.3  --  13.4 13.0  PLT 215  --  190 174   Thyroid  No results for input(s): TSH, FREET4 in the last 168 hours.  BNPNo results for input(s): BNP, PROBNP in the last 168 hours.  DDimer No results for input(s): DDIMER in the last 168 hours.   Radiology    CT Head Wo Contrast Result Date: 04/01/2023 CLINICAL DATA:  Syncope/presyncope, cerebrovascular cause suspected EXAM: CT HEAD WITHOUT CONTRAST TECHNIQUE: Contiguous axial images were obtained from the base of the skull through the vertex without intravenous contrast. RADIATION DOSE REDUCTION: This exam was performed according to the departmental dose-optimization program which includes automated exposure control, adjustment of the mA and/or kV according to patient size and/or use of iterative reconstruction technique. COMPARISON:  09/19/2022 FINDINGS: Brain: Age related volume loss. Chronic small-vessel ischemic changes the white matter. No sign of acute infarction, mass lesion, hemorrhage, hydrocephalus or extra-axial collection. Vascular:  There is atherosclerotic calcification of the major vessels at the base of the brain. Skull: Negative Sinuses/Orbits: Clear/normal Other: None IMPRESSION: No acute CT finding. Age related volume loss. Chronic small-vessel ischemic changes of the white matter. Electronically Signed   By: Oneil Officer M.D.   On: 04/01/2023 20:02   CT Angio Chest PE W and/or Wo Contrast Result Date: 04/01/2023 CLINICAL DATA:  Syncope/presyncope, cerebrovascular cause suspected. Chest pain. Possible pulmonary embolism. EXAM: CT ANGIOGRAPHY CHEST WITH CONTRAST TECHNIQUE: Multidetector CT imaging of the chest was performed using the standard protocol during bolus administration of intravenous contrast. Multiplanar CT image reconstructions and MIPs were obtained to evaluate the vascular anatomy. RADIATION DOSE REDUCTION: This exam was performed according to the departmental dose-optimization program which includes automated exposure control, adjustment of the mA and/or kV according to patient size and/or use of iterative reconstruction technique. CONTRAST:  65mL OMNIPAQUE  IOHEXOL  350 MG/ML SOLN COMPARISON:  12/09/2015 CT.  Chest radiography same day. FINDINGS: Cardiovascular: Heart size upper limits of normal. No right ventricular enlargement. No pericardial fluid. Some coronary artery calcification and aortic atherosclerotic calcification are present. Pulmonary arterial opacification is good. There are no pulmonary emboli. Mediastinum/Nodes: No mediastinal or hilar mass or lymphadenopathy. Lungs/Pleura: Mild dependent atelectasis. No pleural effusion. No acute pneumonia. No mass or nodule. Upper Abdomen: No acute or significant finding. Musculoskeletal: Previous thoracolumbar fusion. Some degenerative change of the disc levels above fusion. No evidence of regional acute fracture. Review of the MIP images confirms the above findings. IMPRESSION: 1. No pulmonary emboli. 2. Coronary artery calcification. Aortic atherosclerotic  calcification. 3. Previous thoracolumbar fusion. Some degenerative change of the disc levels above fusion. Electronically Signed   By: Oneil Officer M.D.   On: 04/01/2023 20:00   DG Chest Port 1 View Result Date: 04/01/2023 CLINICAL DATA:  Chest pain EXAM: PORTABLE CHEST 1 VIEW COMPARISON:  02/17/2020 FINDINGS: Stable cardiomediastinal silhouette. Aortic atherosclerotic calcification. Chronic bronchitic changes are similar to prior. No focal pneumonia, pleural effusion, or pneumothorax. Remote left rib fractures. Cervical and thoracolumbar fusion hardware. IMPRESSION: No active disease. Electronically Signed   By: Norman Gatlin M.D.   On: 04/01/2023 17:30    Cardiac Studies   Myoview 06/07/2014: Impression: 1. No reversible ischemia or infarction. 2. Normal left ventricular wall motion. 3. Left ventricular ejection fraction 57% 4.  Low-risk stress test findings*. _______________  Echocardiogram 09/08/2014: Study Conclusions: - Left ventricle: The cavity size was normal. Wall thickness was    normal. Systolic function was normal. The estimated ejection    fraction was in the range of 60% to 65%. Wall motion was normal;    there were no regional wall motion abnormalities. Doppler    parameters are consistent with abnormal left ventricular    relaxation (grade 1 diastolic dysfunction).  - Aortic valve: Mildly calcified annulus. Trileaflet.  - Mitral valve: Mildly calcified annulus. Mildly calcified leaflets. There was mild regurgitation.  - Tricuspid valve: There was mild regurgitation.    Patient Profile     83 y.o. female with a history of chest pain with negative Myoview in 2012 and 2016, palpitations, hypertension, hyperlipidemia, type 2 diabetes mellitus, GERD, anxiety/ depression, chronic pain syndrome, and moderate dementia who presented on 04/01/2023 for further evaluation of chest pain and near syncope. Cardiology consulted for further evaluation.   Assessment & Plan    Chest  Pain Elevated Troponin Patient presented with atypical chest pain that was reproducible with palpation as well as near syncope. However, she also reported intermittent chest pain and shortness of breath when walking over the last week. EKG showed isolated ST elevation in V2 and new T wave inversions in I and aVL.  High-sensitivity troponin 746 >> 418.  Chest CTA was negative for PE but did show coronary artery calcifications. - Patient denies having any chest pain to me. She has underlying dementia so history is difficult.  - Echo personally reviewed - formal report pending, but LVEF is lower, probably 40-45% with LCx territory WMA - Continue Aspirin  81mg  daily. - History of statin allergy (caused peeling of the skin). Continue Zetia  10mg  daily. LDL 80 - would consider outpatient PCSK9i. - Given regional WMA's and reduced LVEF, would recommend LHC - I discussed the procedure with her daughters Verneita & Jon today (she is the POA), including the risks, benefits and alternatives of cardiac catheterization and she is agreeable to proceed. They both provided verbal consent.    Informed Consent   Shared Decision Making/Informed Consent The risks [stroke (1 in 1000), death (1 in 1000), kidney failure [usually temporary] (1 in 500), bleeding (1 in 200), allergic reaction [possibly serious] (1 in 200)], benefits (diagnostic support and management of coronary artery disease) and alternatives of a cardiac catheterization were discussed in detail with Ms. Merle and she is willing to proceed.    Near Syncope Patient describe possible syncopal episode that occurred after getting up from a chair and walking. She thinks she may of had LOC for a little. She states she had associated palpitations, lightheadedness, and a loud sensation her head like a bell was ringing. She received some IV fluids in the ED. - Hemodynamically stable. - Echo pending. - No significant arrhythmias on telemetry. - Will check  orthostatic vital signs. If negative, can consider consider outpatient monitor.  Hypertension BP mildly elevated. - Will check orthostatic vital signs. If normal, can start to resume home medications.  Hyperlipidemia - History of statin allergy (caused peeling of the skin). Continue Zetia  10mg  daily. - Will check fasting lipid panel tomorrow morning.  AKI Creatinine 1.02 on admission. Baseline around 6.8. - Improved after IV fluids. Creatinine 0.7 this morning.  CODE STATUS - Discussed with daughter Verneita Bar (former nurse) on the phone and it is already known that Ms. Merle wants to be DNR.  Will update code status. Also, discussed with daughter  Jon (who is the power of attorney) and she is also agreeable to the procedure.   Otherwise, per primary team: - Type 2 diabetes mellitus - Anxiety/ depression - Chronic pain - Dementia  For questions or updates, please contact [facility] Please consult [email or web address] for contact info under   Vinie KYM Maxcy, MD, CODY CALANDRA  [place]  [facility]  Medical Director of the [facility] Diplomate of the American Board of Clinical Lipidology Attending Cardiologist  Direct Dial: [phone]  Fax: [phone]  Website:  [email or web address]  Vinie JAYSON Maxcy, MD  04/03/2023, 10:18 AM

## 2023-04-04 ENCOUNTER — Encounter (HOSPITAL_COMMUNITY): Payer: Self-pay | Admitting: Internal Medicine

## 2023-04-04 DIAGNOSIS — I5021 Acute systolic (congestive) heart failure: Secondary | ICD-10-CM | POA: Diagnosis not present

## 2023-04-04 DIAGNOSIS — I4581 Long QT syndrome: Secondary | ICD-10-CM | POA: Diagnosis not present

## 2023-04-04 DIAGNOSIS — I5181 Takotsubo syndrome: Secondary | ICD-10-CM

## 2023-04-04 DIAGNOSIS — Z96643 Presence of artificial hip joint, bilateral: Secondary | ICD-10-CM | POA: Diagnosis not present

## 2023-04-04 DIAGNOSIS — E782 Mixed hyperlipidemia: Secondary | ICD-10-CM | POA: Diagnosis not present

## 2023-04-04 DIAGNOSIS — R55 Syncope and collapse: Secondary | ICD-10-CM | POA: Diagnosis not present

## 2023-04-04 DIAGNOSIS — R9431 Abnormal electrocardiogram [ECG] [EKG]: Secondary | ICD-10-CM | POA: Diagnosis not present

## 2023-04-04 DIAGNOSIS — K219 Gastro-esophageal reflux disease without esophagitis: Secondary | ICD-10-CM | POA: Diagnosis not present

## 2023-04-04 DIAGNOSIS — N179 Acute kidney failure, unspecified: Secondary | ICD-10-CM | POA: Diagnosis not present

## 2023-04-04 DIAGNOSIS — I214 Non-ST elevation (NSTEMI) myocardial infarction: Secondary | ICD-10-CM | POA: Diagnosis not present

## 2023-04-04 DIAGNOSIS — E785 Hyperlipidemia, unspecified: Secondary | ICD-10-CM | POA: Diagnosis not present

## 2023-04-04 DIAGNOSIS — I110 Hypertensive heart disease with heart failure: Secondary | ICD-10-CM | POA: Diagnosis not present

## 2023-04-04 DIAGNOSIS — R7989 Other specified abnormal findings of blood chemistry: Secondary | ICD-10-CM | POA: Diagnosis not present

## 2023-04-04 LAB — CBC WITH DIFFERENTIAL/PLATELET
Abs Immature Granulocytes: 0.04 10*3/uL (ref 0.00–0.07)
Basophils Absolute: 0.1 10*3/uL (ref 0.0–0.1)
Basophils Relative: 1 %
Eosinophils Absolute: 0.4 10*3/uL (ref 0.0–0.5)
Eosinophils Relative: 5 %
HCT: 38.6 % (ref 36.0–46.0)
Hemoglobin: 13.2 g/dL (ref 12.0–15.0)
Immature Granulocytes: 1 %
Lymphocytes Relative: 11 %
Lymphs Abs: 0.9 10*3/uL (ref 0.7–4.0)
MCH: 31 pg (ref 26.0–34.0)
MCHC: 34.2 g/dL (ref 30.0–36.0)
MCV: 90.6 fL (ref 80.0–100.0)
Monocytes Absolute: 1.2 10*3/uL — ABNORMAL HIGH (ref 0.1–1.0)
Monocytes Relative: 15 %
Neutro Abs: 5.5 10*3/uL (ref 1.7–7.7)
Neutrophils Relative %: 67 %
Platelets: 197 10*3/uL (ref 150–400)
RBC: 4.26 MIL/uL (ref 3.87–5.11)
RDW: 13.4 % (ref 11.5–15.5)
WBC: 8.1 10*3/uL (ref 4.0–10.5)
nRBC: 0 % (ref 0.0–0.2)

## 2023-04-04 LAB — PHOSPHORUS: Phosphorus: 3.3 mg/dL (ref 2.5–4.6)

## 2023-04-04 LAB — RETICULOCYTES
Immature Retic Fract: 13.1 % (ref 2.3–15.9)
RBC.: 4.26 MIL/uL (ref 3.87–5.11)
Retic Count, Absolute: 63.5 10*3/uL (ref 19.0–186.0)
Retic Ct Pct: 1.5 % (ref 0.4–3.1)

## 2023-04-04 LAB — COMPREHENSIVE METABOLIC PANEL
ALT: 12 U/L (ref 0–44)
AST: 23 U/L (ref 15–41)
Albumin: 3 g/dL — ABNORMAL LOW (ref 3.5–5.0)
Alkaline Phosphatase: 52 U/L (ref 38–126)
Anion gap: 11 (ref 5–15)
BUN: 6 mg/dL — ABNORMAL LOW (ref 8–23)
CO2: 24 mmol/L (ref 22–32)
Calcium: 9 mg/dL (ref 8.9–10.3)
Chloride: 104 mmol/L (ref 98–111)
Creatinine, Ser: 0.6 mg/dL (ref 0.44–1.00)
GFR, Estimated: >60 mL/min (ref >60)
Glucose, Bld: 97 mg/dL (ref 70–99)
Potassium: 4.1 mmol/L (ref 3.5–5.1)
Sodium: 139 mmol/L (ref 135–145)
Total Bilirubin: 0.9 mg/dL (ref 0.0–1.2)
Total Protein: 6 g/dL — ABNORMAL LOW (ref 6.5–8.1)

## 2023-04-04 LAB — IRON AND TIBC
Iron: 22 ug/dL — ABNORMAL LOW (ref 28–170)
Saturation Ratios: 7 % — ABNORMAL LOW (ref 10.4–31.8)
TIBC: 300 ug/dL (ref 250–450)
UIBC: 278 ug/dL

## 2023-04-04 LAB — VITAMIN B12: Vitamin B-12: 319 pg/mL (ref 180–914)

## 2023-04-04 LAB — FOLATE: Folate: 36.7 ng/mL (ref >5.9)

## 2023-04-04 LAB — FERRITIN: Ferritin: 74 ng/mL (ref 11–307)

## 2023-04-04 LAB — MAGNESIUM: Magnesium: 1.8 mg/dL (ref 1.7–2.4)

## 2023-04-04 MED ORDER — SPIRONOLACTONE 12.5 MG HALF TABLET
12.5000 mg | ORAL_TABLET | Freq: Every day | ORAL | Status: DC
  Administered 2023-04-04 – 2023-04-05 (×2): 12.5 mg via ORAL
  Filled 2023-04-04 (×2): qty 1

## 2023-04-04 MED ORDER — FUROSEMIDE 10 MG/ML IJ SOLN
20.0000 mg | Freq: Once | INTRAMUSCULAR | Status: AC
  Administered 2023-04-04: 20 mg via INTRAVENOUS
  Filled 2023-04-04: qty 2

## 2023-04-04 MED ORDER — LOSARTAN POTASSIUM 50 MG PO TABS
100.0000 mg | ORAL_TABLET | Freq: Every day | ORAL | Status: DC
  Administered 2023-04-05: 100 mg via ORAL
  Filled 2023-04-04: qty 2

## 2023-04-04 MED ORDER — MAGNESIUM HYDROXIDE 400 MG/5ML PO SUSP
30.0000 mL | Freq: Every day | ORAL | Status: DC | PRN
  Administered 2023-04-04: 30 mL via ORAL
  Filled 2023-04-04 (×2): qty 30

## 2023-04-04 MED ORDER — MAGNESIUM SULFATE 2 GM/50ML IV SOLN
2.0000 g | Freq: Once | INTRAVENOUS | Status: AC
Start: 2023-04-04 — End: 2023-04-04
  Administered 2023-04-04: 2 g via INTRAVENOUS
  Filled 2023-04-04: qty 50

## 2023-04-04 MED FILL — Heparin Sodium (Porcine) Inj 1000 Unit/ML: INTRAMUSCULAR | Qty: 10 | Status: AC

## 2023-04-04 NOTE — Progress Notes (Signed)
 Mobility Specialist Progress Note;   04/04/23 0915  Mobility  Activity Ambulated with assistance in hallway  Level of Assistance Contact guard assist, steadying assist  Assistive Device Front wheel walker  Distance Ambulated (ft) 400 ft  Activity Response Tolerated well  Mobility Referral Yes  Mobility visit 1 Mobility  Mobility Specialist Start Time (ACUTE ONLY) 0915  Mobility Specialist Stop Time (ACUTE ONLY) 0930  Mobility Specialist Time Calculation (min) (ACUTE ONLY) 15 min   Pt eager for mobility. Required MinG assistance during ambulation for safety. VSS throughout. C/o back pain, all the way down to legs, requesting pain meds. However, still able to ambulate well. Pt requesting to sit in chair at EOS. Pt left in chair with all needs met, alarm on.   Lauraine Erm Mobility Specialist Please contact via SecureChat or [employer] [phone]

## 2023-04-04 NOTE — Plan of Care (Signed)
   Problem: Education: Goal: Understanding of CV disease, CV risk reduction, and recovery process will improve Outcome: Progressing   Problem: Activity: Goal: Ability to return to baseline activity level will improve Outcome: Progressing   Problem: Cardiovascular: Goal: Ability to achieve and maintain adequate cardiovascular perfusion will improve Outcome: Progressing Goal: Vascular access site(s) Level 0-1 will be maintained Outcome: Progressing   Problem: Health Behavior/Discharge Planning: Goal: Ability to safely manage health-related needs after discharge will improve Outcome: Progressing

## 2023-04-04 NOTE — Progress Notes (Signed)
 Mobility Specialist Progress Note;   04/04/23 0955  Mobility  Activity Ambulated with assistance to bathroom  Level of Assistance Contact guard assist, steadying assist  Assistive Device Other (Comment) (HHA)  Distance Ambulated (ft) 15 ft  Activity Response Tolerated well  Mobility Referral Yes  Mobility visit 1 Mobility  Mobility Specialist Start Time (ACUTE ONLY) 0955  Mobility Specialist Stop Time (ACUTE ONLY) 1000  Mobility Specialist Time Calculation (min) (ACUTE ONLY) 5 min   Answered pts call bell, requesting assistance to BR. Required MinG assistance via HHA to ambulate to BR, void successful. Pt returned back to bed with all needs met. PA and family in room.   Lauraine Erm Mobility Specialist Please contact via SecureChat or [employer] [phone]

## 2023-04-04 NOTE — Progress Notes (Signed)
 PROGRESS NOTE    Kaitlyn Serrano  FMW:[id number] DOB: November 14, 1940 DOA: 04/01/2023 PCP: [name], DO   Brief Narrative:  The patient is an 83 year old elderly Caucasian female past medical history significant for Mann[place] to hypertension, hyperlipidemia, GERD, anxiety, depression, chronic back pain, mild dementia as well as other comorbidities who presented to the ED via EMS for evaluation of right-sided chest discomfort and near syncope.  She took 4 baby aspirin 's prior to arrival and EKG on admission showed new ST elevations V2 and T wave inversion in lead I and aVL.  Cardiology was consulted and reviewed the EKG and felt is not consistent with a STEMI.  She states that she was watching television afternoon and got up to walk to the bedroom with all of a sudden she started feeling dizzy and lightheaded and felt like she is in a pass out.  She denied experiencing any chest pain, pressure, discomfort throughout the day or now and denies shortness of breath but has been having intermittent shortness breath and chest discomfort over the last week or so.  Currently she is resting comfortably.  Given her concerns she presented to the ED and she had a CTA of the chest was negative for PE but did show coronary artery calcification.  CT of the head was negative for any acute abnormalities.  Cardiology was consulted and recommended holding of IV heparin  given her troponin is trending down and currently she is undergoing further workup and echocardiogram is ordered and pending orthostatic vital signs and PT and OT evaluation.    Echocardiogram was done showed a lower EF so she was taken for cardiac cath which showed mild nonobstructive CAD with 20% stenosis in mid LAD.  She had mild mid anterior akinesis with question of Takostubo variation.  Cardiology recommended diuresis and further medication adjustments.  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  -Cardiology was consulted and reviewed the EKG and felt that it was not consistent with STEMI. -Troponin Trending down and went from 746 -> 418 -Cardiology recommending Holding Heparin  gtt at this time -CTA done and showed No pulmonary emboli. Coronary artery calcification. Aortic atherosclerotic calcification. Previous thoracolumbar fusion. Some degenerative change of the disc levels above fusion -Cardiology feels she could have had a Cardiac event with her intermittent CP and SOB that started a week ago and recommending ECHO and then deciding on what type of ischemic workup is indicated; ECHO was done and showed Left ventricular ejection fraction, by estimation, is 40 to 45%. The left ventricle has mildly decreased function. The left ventricle demonstrates global hypokinesis. There is mild concentric left ventricular hypertrophy. Left ventricular diastolic  function could not be evaluated. Because of echo findings cardiology recommended cardiac catheterization she was taken for cath and cath was done and showed Mild, non-obstructive coronary artery disease with 20% stenosis in the mid LAD.  Otherwise, no angiographically significant coronary artery disease identified. Moderately reduced left ventricular systolic function with mid anterior akinesis (LVEF 35-45%).  Question if this represents a Takotsubo variant. Moderately elevated left ventricular filling pressure (LVEDP 25 mmHg).Small bilateral radial arteries that would not allow passage of micropuncture wire above the  mid forearms.  Recommend femoral access for future catheterizations.. -Ideology recommending diuresis and escalation of GDMT for suspected Takotsubo  cardiomyopathy -Given that she appeared volume up she was given a dose of IV Lasix  and further medication adjustments have been made and her lisinopril  is being changed to losartan  100 mg daily with plans to switch to Entresto at some point but will need an ACE inhibitor washout.  They are also recommending starting Aldactone  and they feel that patient not a candidate for SGLT2's inhibitor -Cardiology feels the patient can be discharged tomorrow  Near Syncope -Patient presented to the ED via EMS for evaluation of right-sided chest pain and near syncope.  She took 4 baby aspirin 's prior to arrival.  Troponin elevated but trended down (746> 418) and not consistent with ACS.   -Cardiology recommended holding off starting IV heparin .   -WBC Trend: Recent Labs  Lab 04/01/23 1654 04/02/23 0425 04/03/23 0421 04/04/23 0340  WBC 11.4* 10.7* 9.3 8.1  -CTA chest negative for PE but showing coronary artery calcification.   -CT head was also done for evaluation of near syncope and is negative for acute intracranial abnormality.   -She has no focal neurodeficit on exam and currently patient denies experiencing any chest pain/pressure/discomfort.   -Continue cardiac monitoring and echocardiogram ordered and pending.  -She was in sinus tachycardia this morning -Fall precautions. -No longer getting IVF Hydration with NS at 75 mL/hr as this has stopped -ECHOCardiogram as a follow-up -Cardiology considering outpatient Monitor if Orthostatic VS Negative; Check Orthostatic VS again this AM -PT OT evaluated and recommending no follow-up   AKI, improved -Mild and improved. Creatinine baseline appears to be 0.6-0.7.   -BUN/Cr Trend: Recent Labs  Lab 04/01/23 1654 04/01/23 1709 04/02/23 0425 04/03/23 0421 04/04/23 0340  BUN 21 24* 17 10 6*  CREATININE 1.02* 1.10* 0.70 0.63 0.60  -C/w Gentle IV fluid hydration with NS at 75 mL/hr x12 hours and monitor renal function.  -Avoid Nephrotoxic Medications and  currently holding home Lisinopril , Contrast Dyes, Hypotension and Dehydration to Ensure Adequate Renal Perfusion and will need to Renally Adjust Meds -Continue to Monitor and Trend Renal Function carefully and repeat CMP in the AM   QT prolongation -Monitor potassium and magnesium  levels and repleting so K+ is >4.0 and Mg is > 2.0  -Avoid QT prolonging drugs and follow-up repeat EKG in the morning.  Hypokalemia -Patient's K+ Level Trend: Recent Labs  Lab 04/01/23 1654 04/01/23 1709 04/02/23 0425 04/03/23 0421 04/04/23 0340  K 3.7 3.7 3.6 3.4* 4.1  -Replete with po KCL 40 mEQ BID x2 -Continue to Monitor and Replete as Necessary -Repeat CMP in the AM    Hypertension -Holding antihypertensives at this time and check orthostatics. -Continue to Monitor BP per Protocol -Per Cardiology if Orthostatic VS normal can resume home Medications  -Last BP reading was elevated 166/67   Hyperlipidemia -Has Hx of Statin Allergy which caused peeling of the Skin -Cardiology obtained FLP this AM and showed a total cholesterol/HDL ratio of 2.7, cholesterol 151, HDL 56, LDL of 80, triglycerides of 77, VLDL of 15 -C/w Ezetimibe  10 mg po Daily   Leukocytosis -Likely Reactive and improved -WBC Trend as above -Continue to Monitor for S/Sx of Infection -Repeat CBC in the AM  GERD/GI Prophylaxis  -Resume PPI with Pantoprazole  40 mg BID x2  Anxiety and Depression -C/w Buspirone  20 mg po BID and Mirtazepine 15 mg po at bedtime -Was very Anxious and upset today  Chronic Back Pain -Currently  holding Tizanidine 2 mg po BID and Cylobenzaprine 10 mg po TIDprn Muscle Spasms -Resume Home Hydrocodone -Acetaminophen  10-325 mg po q4hprn  Dementia -C/w Delirium precautions. -Resume Home Donepezil  10 mg po at bedtime  Normocytic Anemia, improved -Hgb/Hct Trend: Recent Labs  Lab 04/01/23 1654 04/01/23 1709 04/02/23 0425 04/03/23 0421 04/04/23 0340  HGB 13.3 13.3 11.8* 12.5 13.2  HCT 39.7 39.0 35.7*  36.5 38.6  MCV 93.2  --  93.0 91.0 90.6  -Checked Anemia Panel and showed an iron level of 22, UIBC 278, TIBC of 300, saturation 7%, corrected level 74, folate of 36.7 and vitamin B12 level of 319 -Continue to Monitor for S/Sx of Bleeding; No overt bleeding noted -Repeat CBC in the AM  Hypoalbuminemia -Patient's Albumin  Trend: Recent Labs  Lab 04/01/23 1654 04/03/23 0421 04/04/23 0340  ALBUMIN  3.1* 2.8* 3.0*  -Continue to Monitor and Trend and repeat CMP in the AM   DVT prophylaxis: enoxaparin  (LOVENOX ) injection 40 mg Start: 04/04/23 0800    Code Status: Limited: Do not attempt resuscitation (DNR) -DNR-LIMITED -Do Not Intubate/DNI  Family Communication: Friend at bedside  Disposition Plan:  Level of care: Progressive Status is: Observation The patient will require care spanning > 2 midnights and should be moved to inpatient because: Needs further clinical improvement and clearance by the cardiology team   Consultants:  Cardiology  Procedures:  ECHOCARDIOGRAM 1. Left ventricular ejection fraction, by estimation, is 40 to 45%. The  left ventricle has mildly decreased function. The left ventricle  demonstrates global hypokinesis. There is mild concentric left ventricular  hypertrophy. Left ventricular diastolic  function could not be evaluated.   2. Right ventricular systolic function is normal. The right ventricular  size is normal. Tricuspid regurgitation signal is inadequate for assessing  PA pressure.   3. The mitral valve is normal in structure. Mild to moderate mitral valve  regurgitation. No evidence of mitral stenosis. Moderate mitral annular  calcification.   4. The aortic valve is normal in structure. Aortic valve regurgitation is  mild to moderate. No aortic stenosis is present.   5. The inferior vena cava is normal in size with greater than 50%  respiratory variability, suggesting right atrial pressure of 3 mmHg.   FINDINGS   Left Ventricle: Left ventricular  ejection fraction, by estimation, is 40  to 45%. The left ventricle has mildly decreased function. The left  ventricle demonstrates global hypokinesis. The left ventricular internal  cavity size was normal in size. There is   mild concentric left ventricular hypertrophy. Left ventricular diastolic  function could not be evaluated due to mitral annular calcification  (moderate or greater). Left ventricular diastolic function could not be  evaluated.   Right Ventricle: The right ventricular size is normal. No increase in  right ventricular wall thickness. Right ventricular systolic function is  normal. Tricuspid regurgitation signal is inadequate for assessing PA  pressure.   Left Atrium: Left atrial size was normal in size.   Right Atrium: Right atrial size was normal in size.   Pericardium: There is no evidence of pericardial effusion. Presence of  epicardial fat layer.   Mitral Valve: The mitral valve is normal in structure. Moderate mitral  annular calcification. Mild to moderate mitral valve regurgitation. No  evidence of mitral valve stenosis.   Tricuspid Valve: The tricuspid valve is normal in structure. Tricuspid  valve regurgitation is not demonstrated. No evidence of tricuspid  stenosis.   Aortic Valve: The aortic valve is normal in structure. Aortic valve  regurgitation  is mild to moderate. No aortic stenosis is present.   Pulmonic Valve: The pulmonic valve was normal in structure. Pulmonic valve  regurgitation is not visualized. No evidence of pulmonic stenosis.   Aorta: The aortic root is normal in size and structure.   Venous: The inferior vena cava is normal in size with greater than 50%  respiratory variability, suggesting right atrial pressure of 3 mmHg.   IAS/Shunts: No atrial level shunt detected by color flow Doppler.     LEFT VENTRICLE  PLAX 2D  LVIDd:         4.40 cm     Diastology  LVIDs:         3.20 cm     LV e' medial:    5.00 cm/s  LV PW:          1.20 cm     LV E/e' medial:  16.7  LV IVS:        1.30 cm     LV e' lateral:   6.42 cm/s  LVOT diam:     1.90 cm     LV E/e' lateral: 13.0  LV SV:         55  LV SV Index:   35  LVOT Area:     2.84 cm    LV Volumes (MOD)  LV vol d, MOD A2C: 85.7 ml  LV vol d, MOD A4C: 88.6 ml  LV vol s, MOD A2C: 43.3 ml  LV vol s, MOD A4C: 56.8 ml  LV SV MOD A2C:     42.4 ml  LV SV MOD A4C:     88.6 ml  LV SV MOD BP:      39.8 ml   RIGHT VENTRICLE             IVC  RV Basal diam:  2.20 cm     IVC diam: 1.60 cm  RV S prime:     13.80 cm/s  TAPSE (M-mode): 3.7 cm   LEFT ATRIUM             Index        RIGHT ATRIUM          Index  LA diam:        3.40 cm 2.17 cm/m   RA Area:     8.48 cm  LA Vol (A2C):   21.7 ml 13.86 ml/m  RA Volume:   15.10 ml 9.65 ml/m  LA Vol (A4C):   29.9 ml 19.10 ml/m  LA Biplane Vol: 25.5 ml 16.29 ml/m   AORTIC VALVE  LVOT Vmax:   105.50 cm/s  LVOT Vmean:  73.650 cm/s  LVOT VTI:    0.194 m    AORTA  Ao Root diam: 2.00 cm  Ao Asc diam:  2.50 cm   MITRAL VALVE  MV Area (PHT): 4.40 cm     SHUNTS  MV Decel Time: 173 msec     Systemic VTI:  0.19 m  MV E velocity: 83.30 cm/s   Systemic Diam: 1.90 cm  MV A velocity: 107.00 cm/s  MV E/A ratio:  0.78   CARDIAC CATHETERIZATION Conclusions: Mild, non-obstructive coronary artery disease with 20% stenosis in the mid LAD.  Otherwise, no angiographically significant coronary artery disease identified. Moderately reduced left ventricular systolic function with mid anterior akinesis (LVEF 35-45%).  Question if this represents a Takotsubo variant. Moderately elevated left ventricular filling pressure (LVEDP 25 mmHg). Small bilateral radial arteries that would not allow passage of micropuncture  wire above the mid forearms.  Recommend femoral access for future catheterizations.   Recommendations: Medical therapy and risk factor modification to prevent progression of mild coronary artery disease. Gentle diuresis and  escalation of goal-directed medical therapy for suspected Takotsubo cardiomyopathy.  Antimicrobials:  Anti-infectives (From admission, onward)    None       Subjective: Seen and examined at bedside and she is very tearful and complaining of chest discomfort.  States her daughter upset her and symptoms started after her daughter and her had an argument.  No nausea or vomiting.  Complains of some tingling down her left arm.  Continues with some dizziness.  No other concerns requested this time.  Objective: Vitals:   04/03/23 2326 04/04/23 0407 04/04/23 1448 04/04/23 1935  BP: 124/65 (!) 158/70 (!) 146/72 135/68  Pulse: 79 86  94  Resp: 18 17 18 20   Temp: 98.4 F (36.9 C) 98 F (36.7 C) 97.6 F (36.4 C) 98.1 F (36.7 C)  TempSrc: Oral Oral Oral Oral  SpO2: 97%   95%  Weight:      Height:        Intake/Output Summary (Last 24 hours) at 04/04/2023 1943 Last data filed at 04/04/2023 1656 Gross per 24 hour  Intake 461.68 ml  Output 1400 ml  Net -938.32 ml   Filed Weights   04/01/23 1647 04/03/23 1259  Weight: 56.7 kg 52.7 kg   Examination: Physical Exam:  Constitutional: Elderly anxious Caucasian female who is tearful and upset Respiratory: Diminished to auscultation bilaterally with coarse breath sounds, no wheezing, rales, rhonchi or crackles. Normal respiratory effort and patient is not tachypenic. No accessory muscle use.  Cardiovascular: RRR, no murmurs / rubs / gallops. S1 and S2 auscultated. No appreciable extremity edema. Abdomen: Soft, non-tender, non-distended.ly. Bowel sounds positive.  GU: Deferred. Musculoskeletal: No clubbing / cyanosis of digits/nails. No joint deformity upper and lower extremities.  Skin: No rashes, lesions, ulcers on a limited skin evaluation. No induration; Warm and dry.  Neurologic: CN 2-12 grossly intact with no focal deficits. Romberg sign and cerebellar reflexes not assessed.  Psychiatric: Anxious and tearful  Data Reviewed: I have  personally reviewed following labs and imaging studies  CBC: Recent Labs  Lab 04/01/23 1654 04/01/23 1709 04/02/23 0425 04/03/23 0421 04/04/23 0340  WBC 11.4*  --  10.7* 9.3 8.1  NEUTROABS 8.9*  --   --  6.6 5.5  HGB 13.3 13.3 11.8* 12.5 13.2  HCT 39.7 39.0 35.7* 36.5 38.6  MCV 93.2  --  93.0 91.0 90.6  PLT 215  --  190 174 197   Basic Metabolic Panel: Recent Labs  Lab 04/01/23 1654 04/01/23 1709 04/02/23 0425 04/03/23 0421 04/04/23 0340  NA 140 140 138 137 139  K 3.7 3.7 3.6 3.4* 4.1  CL 106 106 107 107 104  CO2 24  --  22 22 24   GLUCOSE 111* 108* 93 101* 97  BUN 21 24* 17 10 6*  CREATININE 1.02* 1.10* 0.70 0.63 0.60  CALCIUM 8.7*  --  8.8* 8.7* 9.0  MG  --   --  1.8 1.9 1.8  PHOS  --   --   --  2.8 3.3   GFR: Estimated Creatinine Clearance: 42.9 mL/min (by C-G formula based on SCr of 0.6 mg/dL). Liver Function Tests: Recent Labs  Lab 04/01/23 1654 04/03/23 0421 04/04/23 0340  AST 34 26 23  ALT 16 13 12   ALKPHOS 54 48 52  BILITOT 0.5 1.1 0.9  PROT  5.9* 5.6* 6.0*  ALBUMIN  3.1* 2.8* 3.0*   No results for input(s): LIPASE, AMYLASE in the last 168 hours. No results for input(s): AMMONIA in the last 168 hours. Coagulation Profile: No results for input(s): INR, PROTIME in the last 168 hours. Cardiac Enzymes: No results for input(s): CKTOTAL, CKMB, CKMBINDEX, TROPONINI in the last 168 hours. BNP (last 3 results) No results for input(s): PROBNP in the last 8760 hours. HbA1C: No results for input(s): HGBA1C in the last 72 hours. CBG: No results for input(s): GLUCAP in the last 168 hours. Lipid Profile: Recent Labs    04/03/23 0421  CHOL 151  HDL 56  LDLCALC 80  TRIG 77  CHOLHDL 2.7   Thyroid  Function Tests: No results for input(s): TSH, T4TOTAL, FREET4, T3FREE, THYROIDAB in the last 72 hours. Anemia Panel: Recent Labs    04/04/23 0340  VITAMINB12 319  FOLATE 36.7  FERRITIN 74  TIBC 300  IRON 22*  RETICCTPCT  1.5   Sepsis Labs: No results for input(s): PROCALCITON, LATICACIDVEN in the last 168 hours.  No results found for this or any previous visit (from the past 240 hours).   Radiology Studies: CARDIAC CATHETERIZATION Result Date: 04/03/2023 Conclusions: Mild, non-obstructive coronary artery disease with 20% stenosis in the mid LAD.  Otherwise, no angiographically significant coronary artery disease identified. Moderately reduced left ventricular systolic function with mid anterior akinesis (LVEF 35-45%).  Question if this represents a Takotsubo variant. Moderately elevated left ventricular filling pressure (LVEDP 25 mmHg). Small bilateral radial arteries that would not allow passage of micropuncture wire above the mid forearms.  Recommend femoral access for future catheterizations. Recommendations: Medical therapy and risk factor modification to prevent progression of mild coronary artery disease. Gentle diuresis and escalation of goal-directed medical therapy for suspected Takotsubo cardiomyopathy. Lonni Hanson, MD [facility]  ECHOCARDIOGRAM COMPLETE Result Date: 04/03/2023    ECHOCARDIOGRAM REPORT   Patient Name:   Kaitlyn Serrano Date of Exam: 04/03/2023 Medical Rec #:  [id number]       Height:       62.0 in Accession #:    [id number]      Weight:       125.0 lb Date of Birth:  1940/11/17       BSA:          1.566 m Patient Age:    82 years        BP:           167/72 mmHg Patient Gender: F               HR:           80 bpm. Exam Location:  Inpatient Procedure: 2D Echo, Cardiac Doppler and Color Doppler Indications:    Abnormal ECG R94.31                 Elevated Troponin  History:        Patient has prior history of Echocardiogram examinations, most                 recent 09/08/2014. Abnormal ECG, Signs/Symptoms:Syncope; Risk                 Factors:Hypertension and Diabetes.  Sonographer:    Lanell Maduro Referring Phys: EDITHA RAM IMPRESSIONS  1. Left ventricular ejection fraction, by  estimation, is 40 to 45%. The left ventricle has mildly decreased function. The left ventricle demonstrates global hypokinesis. There is mild concentric left ventricular hypertrophy. Left ventricular diastolic function could  not be evaluated.  2. Right ventricular systolic function is normal. The right ventricular size is normal. Tricuspid regurgitation signal is inadequate for assessing PA pressure.  3. The mitral valve is normal in structure. Mild to moderate mitral valve regurgitation. No evidence of mitral stenosis. Moderate mitral annular calcification.  4. The aortic valve is normal in structure. Aortic valve regurgitation is mild to moderate. No aortic stenosis is present.  5. The inferior vena cava is normal in size with greater than 50% respiratory variability, suggesting right atrial pressure of 3 mmHg. FINDINGS  Left Ventricle: Left ventricular ejection fraction, by estimation, is 40 to 45%. The left ventricle has mildly decreased function. The left ventricle demonstrates global hypokinesis. The left ventricular internal cavity size was normal in size. There is  mild concentric left ventricular hypertrophy. Left ventricular diastolic function could not be evaluated due to mitral annular calcification (moderate or greater). Left ventricular diastolic function could not be evaluated. Right Ventricle: The right ventricular size is normal. No increase in right ventricular wall thickness. Right ventricular systolic function is normal. Tricuspid regurgitation signal is inadequate for assessing PA pressure. Left Atrium: Left atrial size was normal in size. Right Atrium: Right atrial size was normal in size. Pericardium: There is no evidence of pericardial effusion. Presence of epicardial fat layer. Mitral Valve: The mitral valve is normal in structure. Moderate mitral annular calcification. Mild to moderate mitral valve regurgitation. No evidence of mitral valve stenosis. Tricuspid Valve: The tricuspid valve is  normal in structure. Tricuspid valve regurgitation is not demonstrated. No evidence of tricuspid stenosis. Aortic Valve: The aortic valve is normal in structure. Aortic valve regurgitation is mild to moderate. No aortic stenosis is present. Pulmonic Valve: The pulmonic valve was normal in structure. Pulmonic valve regurgitation is not visualized. No evidence of pulmonic stenosis. Aorta: The aortic root is normal in size and structure. Venous: The inferior vena cava is normal in size with greater than 50% respiratory variability, suggesting right atrial pressure of 3 mmHg. IAS/Shunts: No atrial level shunt detected by color flow Doppler.  LEFT VENTRICLE PLAX 2D LVIDd:         4.40 cm     Diastology LVIDs:         3.20 cm     LV e' medial:    5.00 cm/s LV PW:         1.20 cm     LV E/e' medial:  16.7 LV IVS:        1.30 cm     LV e' lateral:   6.42 cm/s LVOT diam:     1.90 cm     LV E/e' lateral: 13.0 LV SV:         55 LV SV Index:   35 LVOT Area:     2.84 cm  LV Volumes (MOD) LV vol d, MOD A2C: 85.7 ml LV vol d, MOD A4C: 88.6 ml LV vol s, MOD A2C: 43.3 ml LV vol s, MOD A4C: 56.8 ml LV SV MOD A2C:     42.4 ml LV SV MOD A4C:     88.6 ml LV SV MOD BP:      39.8 ml RIGHT VENTRICLE             IVC RV Basal diam:  2.20 cm     IVC diam: 1.60 cm RV S prime:     13.80 cm/s TAPSE (M-mode): 3.7 cm LEFT ATRIUM  Index        RIGHT ATRIUM          Index LA diam:        3.40 cm 2.17 cm/m   RA Area:     8.48 cm LA Vol (A2C):   21.7 ml 13.86 ml/m  RA Volume:   15.10 ml 9.65 ml/m LA Vol (A4C):   29.9 ml 19.10 ml/m LA Biplane Vol: 25.5 ml 16.29 ml/m  AORTIC VALVE LVOT Vmax:   105.50 cm/s LVOT Vmean:  73.650 cm/s LVOT VTI:    0.194 m  AORTA Ao Root diam: 2.00 cm Ao Asc diam:  2.50 cm MITRAL VALVE MV Area (PHT): 4.40 cm     SHUNTS MV Decel Time: 173 msec     Systemic VTI:  0.19 m MV E velocity: 83.30 cm/s   Systemic Diam: 1.90 cm MV A velocity: 107.00 cm/s MV E/A ratio:  0.78 Kardie Tobb DO Electronically signed by  Kardie Tobb DO Signature Date/Time: 04/03/2023/11:55:33 AM    Final    Scheduled Meds:  busPIRone   20 mg Oral BID   donepezil   10 mg Oral QHS   enoxaparin  (LOVENOX ) injection  40 mg Subcutaneous Q24H   ezetimibe   10 mg Oral Daily   [START ON 04/05/2023] losartan   100 mg Oral Daily   mirtazapine   15 mg Oral QHS   pantoprazole   40 mg Oral BID   sodium chloride  flush  3 mL Intravenous Q12H   spironolactone   12.5 mg Oral Daily   Continuous Infusions:   LOS: 0 days   Alejandro Marker, DO [facility] Available via Epic secure chat 7am-7pm After these hours, please refer to coverage provider listed on [email or web address] 04/04/2023, 7:43 PM

## 2023-04-04 NOTE — TOC Progression Note (Signed)
 Transition of Care [facility]) - Progression Note    Patient Details  Name: Kaitlyn Serrano MRN: [id number] Date of Birth: 01/19/1941  Transition of Care [facility]) CM/SW Contact  Graves-Bigelow, Erminio Deems, RN Phone Number: 04/04/2023, 4:12 PM  Clinical Narrative:  Case Manager spoke with patient regarding disposition needs and the patient is agreeable to home health RN/PT Services. Case Manager discussed Medicare.gov list and the patient chose [facility]-referral submitted and accepted. [facility] to service within 24-48 hours post transition home. Patient still working on transportation home. Case Manager to continue to follow for transition of care needs as the patient progresses.    Expected Discharge Plan: Home w Home Health Services Barriers to Discharge: No Barriers Identified  Expected Discharge Plan and Services In-house Referral: NA Discharge Planning Services: CM Consult Post Acute Care Choice: Home Health Living arrangements for the past 2 months: Single Family Home    HH Arranged: RN, Disease Management, PT HH Agency: [facility] Date [facility] Agency Contacted: 04/04/23 Time HH Agency Contacted: 1611 Representative spoke with at [facility] Agency: Darleene   Social Determinants of Health (SDOH) Interventions SDOH Screenings   Food Insecurity: No Food Insecurity (04/03/2023)  Housing: Low Risk  (04/03/2023)  Transportation Needs: No Transportation Needs (04/03/2023)  Utilities: Not At Risk (04/03/2023)  Alcohol Screen: Low Risk  (07/11/2022)  Depression (PHQ2-9): Medium Risk (01/16/2023)  Financial Resource Strain: Low Risk  (07/11/2022)  Physical Activity: Insufficiently Active (07/11/2022)  Social Connections: Socially Isolated (04/03/2023)  Stress: No Stress Concern Present (07/11/2022)  Tobacco Use: Medium Risk (04/01/2023)    Readmission Risk Interventions     No data to display

## 2023-04-04 NOTE — Progress Notes (Addendum)
 Patient Name: Kaitlyn Serrano Date of Encounter: 04/04/2023 [facility] Cardiologist: Alvan Carrier, MD   Interval Summary  .    Reports no complaints no chest pain or shortness of breath.  Very confused today but delightful.  Keeps asking the same question.  Vital Signs .    Vitals:   04/03/23 1900 04/03/23 2018 04/03/23 2326 04/04/23 0407  BP: (!) 152/82 (!) 140/73 124/65 (!) 158/70  Pulse: 99 98 79 86  Resp:  20 18 17   Temp:  98.1 F (36.7 C) 98.4 F (36.9 C) 98 F (36.7 C)  TempSrc:  Oral Oral Oral  SpO2: 97% 95% 97%   Weight:      Height:        Intake/Output Summary (Last 24 hours) at 04/04/2023 1011 Last data filed at 04/04/2023 0359 Gross per 24 hour  Intake 240 ml  Output 1400 ml  Net -1160 ml      04/03/2023   12:59 PM 04/01/2023    4:47 PM 02/11/2023   11:13 AM  Last 3 Weights  Weight (lbs) 116 lb 3.2 oz 125 lb 117 lb 6.4 oz  Weight (kg) 52.708 kg 56.7 kg 53.252 kg      Telemetry/ECG    Sinus heart rates in the 80s- Personally Reviewed  CV Studies    Left heart catheterization 04/03/2023 Conclusions: Mild, non-obstructive coronary artery disease with 20% stenosis in the mid LAD.  Otherwise, no angiographically significant coronary artery disease identified. Moderately reduced left ventricular systolic function with mid anterior akinesis (LVEF 35-45%).  Question if this represents a Takotsubo variant. Moderately elevated left ventricular filling pressure (LVEDP 25 mmHg). Small bilateral radial arteries that would not allow passage of micropuncture wire above the mid forearms.  Recommend femoral access for future catheterizations.   Recommendations: Medical therapy and risk factor modification to prevent progression of mild coronary artery disease. Gentle diuresis and escalation of goal-directed medical therapy for suspected Takotsubo cardiomyopathy.   Echocardiogram 04/03/2023  1. Left ventricular ejection fraction, by estimation, is 40 to  45%. The  left ventricle has mildly decreased function. The left ventricle  demonstrates global hypokinesis. There is mild concentric left ventricular  hypertrophy. Left ventricular diastolic  function could not be evaluated.   2. Right ventricular systolic function is normal. The right ventricular  size is normal. Tricuspid regurgitation signal is inadequate for assessing  PA pressure.   3. The mitral valve is normal in structure. Mild to moderate mitral valve  regurgitation. No evidence of mitral stenosis. Moderate mitral annular  calcification.   4. The aortic valve is normal in structure. Aortic valve regurgitation is  mild to moderate. No aortic stenosis is present.   5. The inferior vena cava is normal in size with greater than 50%  respiratory variability, suggesting right atrial pressure of 3 mmHg.       Physical Exam .   GEN: No acute distress.   Neck: + JVD Cardiac: RRR, no murmurs, rubs, or gallops.  Respiratory: Clear to auscultation bilaterally. GI: Soft, nontender, non-distended  MS: No edema  R goin free of acute complications,    Patient Profile    Kaitlyn Serrano is a 83 y.o. female has hx of hypertension, hyperlipidemia, type 2 diabetes, anxiety depression, chronic pain syndrome, statin allergy, dementia.  Currently being evaluated for an episode of chest pain and a near syncopal event.  Assessment & Plan .     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  EKG showed isolated ST elevation in V2 and new T wave inversions in lateral leads.  Troponins peaked at 746.  Echocardiogram showed new reduction EF 40 to 45% with mild concentric LVH, global hypokinesis normal RV function.  Left heart catheterization showing mild nonobstructive CAD with 20% stenosis in the mid LAD.  Had mid anterior akinesis with question of Takotsubo variation.  Reports recent back surgery.  LVEDP 25. Will give 1 dose of IV Lasix  20 mg With mildly reduced EF and limited income, dementia, I think being more conservative with GDMT is reasonable.  Will discuss with MD about discharge meds.  Currently on lisinopril  20 mg, this likely needs to be increased.  Atenolol  could be switched to carvedilol , I think has prior hx of palpations.  Do not restart her home diltiazem . Continue aspirin , has statin allergy (caused peeling of the skin), Zetia .  LDL 80.  Valvular disease Mild to moderate MR.  Moderate MAC.  Mild to moderate AR.  Continue to monitor on serial echocardiograms.   Follow-up scheduled with Miriam   For questions or updates, please contact [facility] Please consult [email or web address] for contact info under        Signed, Thom LITTIE Sluder, PA-C

## 2023-04-04 NOTE — Progress Notes (Signed)
 Heart Failure Navigator Progress Note  Assessed for Heart & Vascular TOC clinic readiness.  Patient does not meet criteria due to per MD note patient with history of Dementia. .   Navigator will sign off at this time.   Rhae Hammock, BSN, Scientist, clinical (histocompatibility and immunogenetics) Only

## 2023-04-05 DIAGNOSIS — K219 Gastro-esophageal reflux disease without esophagitis: Secondary | ICD-10-CM | POA: Diagnosis not present

## 2023-04-05 DIAGNOSIS — I5021 Acute systolic (congestive) heart failure: Secondary | ICD-10-CM | POA: Diagnosis not present

## 2023-04-05 DIAGNOSIS — R7989 Other specified abnormal findings of blood chemistry: Secondary | ICD-10-CM | POA: Diagnosis not present

## 2023-04-05 DIAGNOSIS — I5020 Unspecified systolic (congestive) heart failure: Secondary | ICD-10-CM | POA: Diagnosis not present

## 2023-04-05 DIAGNOSIS — E782 Mixed hyperlipidemia: Secondary | ICD-10-CM | POA: Diagnosis not present

## 2023-04-05 DIAGNOSIS — R9431 Abnormal electrocardiogram [ECG] [EKG]: Secondary | ICD-10-CM | POA: Diagnosis not present

## 2023-04-05 DIAGNOSIS — N179 Acute kidney failure, unspecified: Secondary | ICD-10-CM | POA: Diagnosis not present

## 2023-04-05 DIAGNOSIS — R55 Syncope and collapse: Secondary | ICD-10-CM | POA: Diagnosis not present

## 2023-04-05 LAB — CBC WITH DIFFERENTIAL/PLATELET
Abs Immature Granulocytes: 0.06 10*3/uL (ref 0.00–0.07)
Basophils Absolute: 0.1 10*3/uL (ref 0.0–0.1)
Basophils Relative: 1 %
Eosinophils Absolute: 0.5 10*3/uL (ref 0.0–0.5)
Eosinophils Relative: 6 %
HCT: 37.2 % (ref 36.0–46.0)
Hemoglobin: 12.4 g/dL (ref 12.0–15.0)
Immature Granulocytes: 1 %
Lymphocytes Relative: 14 %
Lymphs Abs: 1.2 10*3/uL (ref 0.7–4.0)
MCH: 30.6 pg (ref 26.0–34.0)
MCHC: 33.3 g/dL (ref 30.0–36.0)
MCV: 91.9 fL (ref 80.0–100.0)
Monocytes Absolute: 1.4 10*3/uL — ABNORMAL HIGH (ref 0.1–1.0)
Monocytes Relative: 16 %
Neutro Abs: 5.3 10*3/uL (ref 1.7–7.7)
Neutrophils Relative %: 62 %
Platelets: 200 10*3/uL (ref 150–400)
RBC: 4.05 MIL/uL (ref 3.87–5.11)
RDW: 13.4 % (ref 11.5–15.5)
WBC: 8.4 10*3/uL (ref 4.0–10.5)
nRBC: 0 % (ref 0.0–0.2)

## 2023-04-05 LAB — COMPREHENSIVE METABOLIC PANEL
ALT: 12 U/L (ref 0–44)
AST: 23 U/L (ref 15–41)
Albumin: 2.9 g/dL — ABNORMAL LOW (ref 3.5–5.0)
Alkaline Phosphatase: 50 U/L (ref 38–126)
Anion gap: 13 (ref 5–15)
BUN: 13 mg/dL (ref 8–23)
CO2: 24 mmol/L (ref 22–32)
Calcium: 9.4 mg/dL (ref 8.9–10.3)
Chloride: 100 mmol/L (ref 98–111)
Creatinine, Ser: 0.79 mg/dL (ref 0.44–1.00)
GFR, Estimated: >60 mL/min (ref >60)
Glucose, Bld: 107 mg/dL — ABNORMAL HIGH (ref 70–99)
Potassium: 4.3 mmol/L (ref 3.5–5.1)
Sodium: 137 mmol/L (ref 135–145)
Total Bilirubin: 0.7 mg/dL (ref 0.0–1.2)
Total Protein: 5.8 g/dL — ABNORMAL LOW (ref 6.5–8.1)

## 2023-04-05 LAB — PHOSPHORUS: Phosphorus: 3.7 mg/dL (ref 2.5–4.6)

## 2023-04-05 LAB — MAGNESIUM: Magnesium: 2.3 mg/dL (ref 1.7–2.4)

## 2023-04-05 MED ORDER — LOSARTAN POTASSIUM 100 MG PO TABS: 100.0000 mg | ORAL_TABLET | Freq: Every day | ORAL | 0 refills | Status: DC

## 2023-04-05 MED ORDER — METHOCARBAMOL 500 MG PO TABS
500.0000 mg | ORAL_TABLET | Freq: Four times a day (QID) | ORAL | Status: DC | PRN
  Administered 2023-04-05: 500 mg via ORAL
  Filled 2023-04-05: qty 1

## 2023-04-05 MED ORDER — CARVEDILOL 6.25 MG PO TABS: 6.2500 mg | ORAL_TABLET | Freq: Two times a day (BID) | ORAL | 11 refills | Status: DC

## 2023-04-05 MED ORDER — SPIRONOLACTONE 25 MG PO TABS
12.5000 mg | ORAL_TABLET | Freq: Every day | ORAL | 0 refills | Status: DC
Start: 2023-04-06 — End: 2023-04-10

## 2023-04-05 NOTE — TOC Transition Note (Signed)
 Transition of Care [name]) - Discharge Note   Patient Details  Name: Kaitlyn Serrano MRN: [id number] Date of Birth: 09-17-1940  Transition of Care [facility]) CM/SW Contact:  Robynn Eileen Hoose, RN Phone Number: 04/05/2023, 1:40 PM   Clinical Narrative:  Patient is being discharged today, Darleene with [facility] made aware.      Final next level of care: Home w Home Health Services Barriers to Discharge: No Barriers Identified   Patient Goals and CMS Choice Patient states their goals for this hospitalization and ongoing recovery are:: find out what's going on so I can go home.   Choice offered to / list presented to : Patient      Discharge Placement                       Discharge Plan and Services Additional resources added to the After Visit Summary for   In-house Referral: NA Discharge Planning Services: CM Consult Post Acute Care Choice: Home Health                    HH Arranged: RN, Disease Management, PT HH Agency: [facility] Date [facility] Agency Contacted: 04/04/23 Time HH Agency Contacted: 1611 Representative spoke with at [facility] Agency: Darleene  Social Drivers of Health (SDOH) Interventions SDOH Screenings   Food Insecurity: No Food Insecurity (04/03/2023)  Housing: Low Risk  (04/03/2023)  Transportation Needs: No Transportation Needs (04/03/2023)  Utilities: Not At Risk (04/03/2023)  Alcohol Screen: Low Risk  (07/11/2022)  Depression (PHQ2-9): Medium Risk (01/16/2023)  Financial Resource Strain: Low Risk  (07/11/2022)  Physical Activity: Insufficiently Active (07/11/2022)  Social Connections: Socially Isolated (04/03/2023)  Stress: No Stress Concern Present (07/11/2022)  Tobacco Use: Medium Risk (04/01/2023)     Readmission Risk Interventions     No data to display

## 2023-04-05 NOTE — Plan of Care (Signed)
  Problem: Education: Goal: Knowledge of General Education information will improve Description: Including pain rating scale, medication(s)/side effects and non-pharmacologic comfort measures Outcome: Progressing   Problem: Health Behavior/Discharge Planning: Goal: Ability to manage health-related needs will improve Outcome: Progressing   Problem: Clinical Measurements: Goal: Ability to maintain clinical measurements within normal limits will improve Outcome: Progressing Goal: Will remain free from infection Outcome: Progressing Goal: Diagnostic test results will improve Outcome: Progressing Goal: Respiratory complications will improve Outcome: Not Applicable Goal: Cardiovascular complication will be avoided Outcome: Progressing   Problem: Activity: Goal: Risk for activity intolerance will decrease Outcome: Progressing   Problem: Nutrition: Goal: Adequate nutrition will be maintained Outcome: Progressing   Problem: Elimination: Goal: Will not experience complications related to bowel motility Outcome: Progressing Goal: Will not experience complications related to urinary retention Outcome: Progressing   Problem: Pain Managment: Goal: General experience of comfort will improve and/or be controlled Outcome: Progressing   Problem: Safety: Goal: Ability to remain free from injury will improve Outcome: Progressing   Problem: Skin Integrity: Goal: Risk for impaired skin integrity will decrease Outcome: Progressing   Problem: Education: Goal: Understanding of CV disease, CV risk reduction, and recovery process will improve Outcome: Progressing   Problem: Activity: Goal: Ability to return to baseline activity level will improve Outcome: Progressing   Problem: Cardiovascular: Goal: Ability to achieve and maintain adequate cardiovascular perfusion will improve Outcome: Progressing Goal: Vascular access site(s) Level 0-1 will be maintained Outcome: Progressing    Problem: Health Behavior/Discharge Planning: Goal: Ability to safely manage health-related needs after discharge will improve Outcome: Progressing

## 2023-04-05 NOTE — Progress Notes (Signed)
 Pt refused aricept , states is causes bad dreams.

## 2023-04-05 NOTE — Discharge Summary (Signed)
 Physician Discharge Summary   Patient: Kaitlyn Serrano MRN: [id number] DOB: 12-29-1940  Admit date:     04/01/2023  Discharge date: 04/05/2023  Discharge Physician: Alejandro Marker, DO   PCP: [name], DO   Recommendations at discharge:   Follow-up with PCP within 1 to 2 weeks repeat CBC, CMP, mag, Phos within 1 week Follow-up with cardiology in outpatient setting within 1 to 2 weeks and continue with GDMT and outpatient follow-up  Discharge Diagnoses: Principal Problem:   Near syncope Active Problems:   Hypertension   Hyperlipemia   GERD (gastroesophageal reflux disease)   Chest pain   AKI (acute kidney injury) (HCC)   QT prolongation   Elevated troponin   Non-ST elevation (NSTEMI) myocardial infarction (HCC)   Acute systolic heart failure (HCC)  Resolved Problems:   * No resolved hospital problems. [facility] Course: The patient is an 83 year old elderly Caucasian female past medical history significant for Mannam to hypertension, hyperlipidemia, GERD, anxiety, depression, chronic back pain, mild dementia as well as other comorbidities who presented to the ED via EMS for evaluation of right-sided chest discomfort and near syncope.  She took 4 baby aspirin 's prior to arrival and EKG on admission showed new ST elevations V2 and T wave inversion in lead I and aVL.  Cardiology was consulted and reviewed the EKG and felt is not consistent with a STEMI.  She states that she was watching television afternoon and got up to walk to the bedroom with all of a sudden she started feeling dizzy and lightheaded and felt like she is in a pass out.  She denied experiencing any chest pain, pressure, discomfort throughout the day or now and denies shortness of breath but has been having intermittent shortness breath and chest discomfort over the last week or so.  Currently she is resting comfortably.  Given her concerns she presented to the ED and she had a CTA of the chest was negative for PE  but did show coronary artery calcification.  CT of the head was negative for any acute abnormalities.  Cardiology was consulted and recommended holding of IV heparin  given her troponin is trending down and currently she is undergoing further workup and echocardiogram is ordered and pending orthostatic vital signs and PT and OT evaluation.    Echocardiogram was done showed a lower EF so she was taken for cardiac cath which showed mild nonobstructive CAD with 20% stenosis in mid LAD.  She had mild mid anterior akinesis with question of Takostubo variation.  Cardiology recommended diuresis and further medication adjustments.  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  -Cardiology was consulted and reviewed the EKG and felt that it was not consistent with STEMI. -Troponin Trending down and went from 746 -> 418 -Cardiology recommending Holding Heparin  gtt at this time -CTA done and showed No pulmonary emboli. Coronary artery calcification. Aortic atherosclerotic calcification. Previous thoracolumbar fusion. Some degenerative change of the disc levels above fusion -Cardiology feels she could have had a Cardiac event with her intermittent CP and SOB that started a week ago and recommending ECHO and then deciding on what type of ischemic workup is indicated;  ECHO was done and showed Left ventricular ejection fraction, by estimation, is 40 to 45%. The left ventricle has mildly decreased function. The left ventricle demonstrates global hypokinesis. There is mild concentric left ventricular hypertrophy. Left ventricular diastolic  function could not be evaluated. Because of echo findings cardiology  recommended cardiac catheterization she was taken for cath and cath was done and showed Mild, non-obstructive coronary artery disease with 20% stenosis in the mid LAD.  Otherwise, no angiographically significant coronary artery disease identified. Moderately reduced left ventricular systolic function with mid anterior akinesis (LVEF 35-45%).  Question if this represents a Takotsubo variant. Moderately elevated left ventricular filling pressure (LVEDP 25 mmHg).Small bilateral radial arteries that would not allow passage of micropuncture wire above the mid forearms.  Recommend femoral access for future catheterizations.. -Ideology recommending diuresis and escalation of GDMT for suspected Takotsubo cardiomyopathy -Given that she appeared volume up she was given a dose of IV Lasix  and further medication adjustments have been made and her lisinopril  is being changed to losartan  100 mg daily with plans to switch to Entresto at some point but will need an ACE inhibitor washout.  They are also recommending starting Aldactone  and they feel that patient not a candidate for SGLT2's inhibitor -Cardiology feels the patient can be discharged tomorrow  Near Syncope -Patient presented to the ED via EMS for evaluation of right-sided chest pain and near syncope.  She took 4 baby aspirin 's prior to arrival.  Troponin elevated but trended down (746> 418) and not consistent with ACS.   -Cardiology recommended holding off starting IV heparin .   -WBC Trend: Recent Labs  Lab 04/01/23 1654 04/02/23 0425 04/03/23 0421 04/04/23 0340  WBC 11.4* 10.7* 9.3 8.1  -CTA chest negative for PE but showing coronary artery calcification.   -CT head was also done for evaluation of near syncope and is negative for acute intracranial abnormality.   -She has no focal neurodeficit on exam and currently patient denies experiencing any chest pain/pressure/discomfort.   -Continue cardiac monitoring and echocardiogram ordered and pending.   -She was in sinus tachycardia this morning -Fall precautions. -No longer getting IVF Hydration with NS at 75 mL/hr as this has stopped -ECHOCardiogram as a follow-up -Cardiology considering outpatient Monitor if Orthostatic VS Negative; Check Orthostatic VS again this AM -PT OT evaluated and recommending no follow-up   AKI, improved -Mild and improved. Creatinine baseline appears to be 0.6-0.7.   -BUN/Cr Trend: Recent Labs  Lab 04/01/23 1654 04/01/23 1709 04/02/23 0425 04/03/23 0421 04/04/23 0340  BUN 21 24* 17 10 6*  CREATININE 1.02* 1.10* 0.70 0.63 0.60  -C/w Gentle IV fluid hydration with NS at 75 mL/hr x12 hours and monitor renal function.  -Avoid Nephrotoxic Medications and currently holding home Lisinopril , Contrast Dyes, Hypotension and Dehydration to Ensure Adequate Renal Perfusion and will need to Renally Adjust Meds -Continue to Monitor and Trend Renal Function carefully and repeat CMP in the AM   QT prolongation -Monitor potassium and magnesium  levels and repleting so K+ is >4.0 and Mg is > 2.0  -Avoid QT prolonging drugs and follow-up repeat EKG in the morning.  Hypokalemia -Patient's K+ Level Trend: Recent Labs  Lab 04/01/23 1654 04/01/23 1709 04/02/23 0425 04/03/23 0421 04/04/23 0340  K 3.7 3.7 3.6 3.4* 4.1  -Replete with po KCL 40 mEQ BID x2 -Continue to Monitor and Replete as Necessary -Repeat CMP in the AM    Hypertension -Holding antihypertensives at this time and check orthostatics. -Continue to Monitor BP per Protocol -  Per Cardiology if Orthostatic VS normal can resume home Medications  -Last BP reading was elevated 166/67   Hyperlipidemia -Has Hx of Statin Allergy which caused peeling of the Skin -Cardiology obtained FLP this AM and showed a total cholesterol/HDL ratio of 2.7, cholesterol 151, HDL 56, LDL of 80, triglycerides of 77, VLDL of 15 -C/w Ezetimibe  10 mg po Daily   Leukocytosis -Likely Reactive and improved -WBC Trend as  above -Continue to Monitor for S/Sx of Infection -Repeat CBC in the AM  GERD/GI Prophylaxis  -Resume PPI with Pantoprazole  40 mg BID x2  Anxiety and Depression -C/w Buspirone  20 mg po BID and Mirtazepine 15 mg po at bedtime -Was very Anxious and upset today  Chronic Back Pain -Currently holding Tizanidine 2 mg po BID and Cylobenzaprine 10 mg po TIDprn Muscle Spasms -Resume Home Hydrocodone -Acetaminophen  10-325 mg po q4hprn  Dementia -C/w Delirium precautions. -Resume Home Donepezil  10 mg po at bedtime  Normocytic Anemia, improved -Hgb/Hct Trend: Recent Labs  Lab 04/01/23 1654 04/01/23 1709 04/02/23 0425 04/03/23 0421 04/04/23 0340  HGB 13.3 13.3 11.8* 12.5 13.2  HCT 39.7 39.0 35.7* 36.5 38.6  MCV 93.2  --  93.0 91.0 90.6  -Checked Anemia Panel and showed an iron level of 22, UIBC 278, TIBC of 300, saturation 7%, corrected level 74, folate of 36.7 and vitamin B12 level of 319 -Continue to Monitor for S/Sx of Bleeding; No overt bleeding noted -Repeat CBC in the AM  Hypoalbuminemia -Patient's Albumin  Trend: Recent Labs  Lab 04/01/23 1654 04/03/23 0421 04/04/23 0340  ALBUMIN  3.1* 2.8* 3.0*  -Continue to Monitor and Trend and repeat CMP in the AM  Consultants: Cardiology Procedures performed: As delineated above Disposition: Home health Diet recommendation:  Discharge Diet Orders (From admission, onward)     Start     Ordered   04/05/23 0000  Diet - low sodium heart healthy        04/05/23 1319           Cardiac diet DISCHARGE MEDICATION: Allergies as of 04/05/2023       Reactions   Bee Venom Anaphylaxis   Penicillins Anaphylaxis, Rash   Amlodipine  Swelling   Elavil  [amitriptyline  Hcl] Other (See Comments)   Confusion, hallucinations   Statins Swelling, Other (See Comments)   Peeling of skin        Medication List     STOP taking these medications    atenolol  25 MG tablet Commonly known as: TENORMIN    celecoxib  200 MG capsule Commonly  known as: CELEBREX    conjugated estrogens  vaginal cream Commonly known as: PREMARIN    cyclobenzaprine  10 MG tablet Commonly known as: FLEXERIL    diltiazem  120 MG 24 hr capsule Commonly known as: Tiadylt  ER   docusate sodium  100 MG capsule Commonly known as: Colace   hydrALAZINE  25 MG tablet Commonly known as: APRESOLINE    ipratropium 0.06 % nasal spray Commonly known as: ATROVENT    levofloxacin  500 MG tablet Commonly known as: LEVAQUIN    lisinopril  40 MG tablet Commonly known as: ZESTRIL    loratadine  10 MG tablet Commonly known as: CLARITIN    tiZANidine 2 MG tablet Commonly known as: ZANAFLEX       TAKE these medications    aspirin  EC 81 MG tablet Take 81 mg by mouth daily. Swallow whole.   busPIRone  10 MG tablet Commonly known as: BUSPAR  Take 2 tablets (20 mg total) by mouth 2 (two) times daily. What changed:  how much to take when to take this   carvedilol   6.25 MG tablet Commonly known as: Coreg  Take 1 tablet (6.25 mg total) by mouth 2 (two) times daily.   donepezil  10 MG tablet Commonly known as: ARICEPT  Take 1 tablet (10 mg total) by mouth at bedtime. For dementia   ezetimibe  10 MG tablet Commonly known as: ZETIA  TAKE ONE TABLET BY MOUTH DAILY AT 9AM What changed: See the new instructions.   HYDROcodone -acetaminophen  10-325 MG tablet Commonly known as: NORCO Take 1 tablet by mouth every 4 (four) hours as needed for severe pain.   losartan  100 MG tablet Commonly known as: COZAAR  Take 1 tablet (100 mg total) by mouth daily. Start taking on: April 06, 2023   mirtazapine  15 MG tablet Commonly known as: REMERON  Take 1 tablet (15 mg total) by mouth at bedtime. For anxiety/ appetite/ sleep   pantoprazole  40 MG tablet Commonly known as: PROTONIX  Take 1 tablet (40 mg total) by mouth 2 (two) times daily.   polyethylene glycol 17 g packet Commonly known as: MIRALAX  / GLYCOLAX  Take 17 g by mouth 2 (two) times daily. What changed:  when to  take this reasons to take this   spironolactone  25 MG tablet Commonly known as: ALDACTONE  Take 0.5 tablets (12.5 mg total) by mouth daily. Start taking on: April 06, 2023   traZODone  50 MG tablet Commonly known as: DESYREL  Take 50 mg by mouth at bedtime.        Follow-up Information     Miriam Norris, NP Follow up.   Specialty: Cardiology Why: Thursday May 08, 2023 Appt at 8:30 AM (30 min) Miriam Norris, NP Contact information: [address] [place] [facility] [place] [address] [phone]         [facility] Follow up.   Specialty: Home Health Services Why: Registered Nurse, Physical Therapy-office to call for visit times. Contact information: [address] [place] [address] [phone]                Discharge Exam: Filed Weights   04/01/23 1647 04/03/23 1259  Weight: 56.7 kg 52.7 kg   Vitals:   04/05/23 0741 04/05/23 1232  BP: (!) 161/73 (!) 164/83  Pulse: 95 86  Resp:  19  Temp: 98.8 F (37.1 C) 97.6 F (36.4 C)  SpO2:  96%   Examination: Physical Exam:  Constitutional: Thin chronically ill-appearing elderly Caucasian female in no acute distress Respiratory: Diminished to auscultation bilaterally, no wheezing, rales, rhonchi or crackles. Normal respiratory effort and patient is not tachypenic. No accessory muscle use.  Cardiovascular: RRR, no murmurs / rubs / gallops. S1 and S2 auscultated.  Extremity edema Abdomen: Soft, non-tender, non-distended. Bowel sounds positive.  GU: Deferred. Musculoskeletal: No clubbing / cyanosis of digits/nails. No joint deformity upper and lower extremities.  Skin: No rashes, lesions, ulcers limited skin evaluation. No induration; Warm and dry.  Neurologic: CN 2-12 grossly intact with no focal deficits. Romberg sign and cerebellar reflexes not assessed.  Psychiatric: Awake and alert in no acute distress  Condition at discharge: stable  The results of significant diagnostics from  this hospitalization (including imaging, microbiology, ancillary and laboratory) are listed below for reference.   Imaging Studies: CARDIAC CATHETERIZATION Result Date: 04/03/2023 Conclusions: Mild, non-obstructive coronary artery disease with 20% stenosis in the mid LAD.  Otherwise, no angiographically significant coronary artery disease identified. Moderately reduced left ventricular systolic function with mid anterior akinesis (LVEF 35-45%).  Question if this represents a Takotsubo variant. Moderately elevated left ventricular filling pressure (LVEDP 25 mmHg). Small  bilateral radial arteries that would not allow passage of micropuncture wire above the mid forearms.  Recommend femoral access for future catheterizations. Recommendations: Medical therapy and risk factor modification to prevent progression of mild coronary artery disease. Gentle diuresis and escalation of goal-directed medical therapy for suspected Takotsubo cardiomyopathy. Lonni Hanson, MD [facility]  ECHOCARDIOGRAM COMPLETE Result Date: 04/03/2023    ECHOCARDIOGRAM REPORT   Patient Name:   Kaitlyn Serrano Date of Exam: 04/03/2023 Medical Rec #:  [id number]       Height:       62.0 in Accession #:    [id number]      Weight:       125.0 lb Date of Birth:  [birth year]       BSA:          1.566 m Patient Age:    82 years        BP:           167/72 mmHg Patient Gender: F               HR:           80 bpm. Exam Location:  Inpatient Procedure: 2D Echo, Cardiac Doppler and Color Doppler Indications:    Abnormal ECG R94.31                 Elevated Troponin  History:        Patient has prior history of Echocardiogram examinations, most                 recent 09/08/2014. Abnormal ECG, Signs/Symptoms:Syncope; Risk                 Factors:Hypertension and Diabetes.  Sonographer:    Lanell Maduro Referring Phys: EDITHA RAM IMPRESSIONS  1. Left ventricular ejection fraction, by estimation, is 40 to 45%. The left ventricle has mildly decreased  function. The left ventricle demonstrates global hypokinesis. There is mild concentric left ventricular hypertrophy. Left ventricular diastolic function could not be evaluated.  2. Right ventricular systolic function is normal. The right ventricular size is normal. Tricuspid regurgitation signal is inadequate for assessing PA pressure.  3. The mitral valve is normal in structure. Mild to moderate mitral valve regurgitation. No evidence of mitral stenosis. Moderate mitral annular calcification.  4. The aortic valve is normal in structure. Aortic valve regurgitation is mild to moderate. No aortic stenosis is present.  5. The inferior vena cava is normal in size with greater than 50% respiratory variability, suggesting right atrial pressure of 3 mmHg. FINDINGS  Left Ventricle: Left ventricular ejection fraction, by estimation, is 40 to 45%. The left ventricle has mildly decreased function. The left ventricle demonstrates global hypokinesis. The left ventricular internal cavity size was normal in size. There is  mild concentric left ventricular hypertrophy. Left ventricular diastolic function could not be evaluated due to mitral annular calcification (moderate or greater). Left ventricular diastolic function could not be evaluated. Right Ventricle: The right ventricular size is normal. No increase in right ventricular wall thickness. Right ventricular systolic function is normal. Tricuspid regurgitation signal is inadequate for assessing PA pressure. Left Atrium: Left atrial size was normal in size. Right Atrium: Right atrial size was normal in size. Pericardium: There is no evidence of pericardial effusion. Presence of epicardial fat layer. Mitral Valve: The mitral valve is normal in structure. Moderate mitral annular calcification. Mild to moderate mitral valve regurgitation. No evidence of mitral valve stenosis. Tricuspid Valve: The tricuspid valve is normal in  structure. Tricuspid valve regurgitation is not  demonstrated. No evidence of tricuspid stenosis. Aortic Valve: The aortic valve is normal in structure. Aortic valve regurgitation is mild to moderate. No aortic stenosis is present. Pulmonic Valve: The pulmonic valve was normal in structure. Pulmonic valve regurgitation is not visualized. No evidence of pulmonic stenosis. Aorta: The aortic root is normal in size and structure. Venous: The inferior vena cava is normal in size with greater than 50% respiratory variability, suggesting right atrial pressure of 3 mmHg. IAS/Shunts: No atrial level shunt detected by color flow Doppler.  LEFT VENTRICLE PLAX 2D LVIDd:         4.40 cm     Diastology LVIDs:         3.20 cm     LV e' medial:    5.00 cm/s LV PW:         1.20 cm     LV E/e' medial:  16.7 LV IVS:        1.30 cm     LV e' lateral:   6.42 cm/s LVOT diam:     1.90 cm     LV E/e' lateral: 13.0 LV SV:         55 LV SV Index:   35 LVOT Area:     2.84 cm  LV Volumes (MOD) LV vol d, MOD A2C: 85.7 ml LV vol d, MOD A4C: 88.6 ml LV vol s, MOD A2C: 43.3 ml LV vol s, MOD A4C: 56.8 ml LV SV MOD A2C:     42.4 ml LV SV MOD A4C:     88.6 ml LV SV MOD BP:      39.8 ml RIGHT VENTRICLE             IVC RV Basal diam:  2.20 cm     IVC diam: 1.60 cm RV S prime:     13.80 cm/s TAPSE (M-mode): 3.7 cm LEFT ATRIUM             Index        RIGHT ATRIUM          Index LA diam:        3.40 cm 2.17 cm/m   RA Area:     8.48 cm LA Vol (A2C):   21.7 ml 13.86 ml/m  RA Volume:   15.10 ml 9.65 ml/m LA Vol (A4C):   29.9 ml 19.10 ml/m LA Biplane Vol: 25.5 ml 16.29 ml/m  AORTIC VALVE LVOT Vmax:   105.50 cm/s LVOT Vmean:  73.650 cm/s LVOT VTI:    0.194 m  AORTA Ao Root diam: 2.00 cm Ao Asc diam:  2.50 cm MITRAL VALVE MV Area (PHT): 4.40 cm     SHUNTS MV Decel Time: 173 msec     Systemic VTI:  0.19 m MV E velocity: 83.30 cm/s   Systemic Diam: 1.90 cm MV A velocity: 107.00 cm/s MV E/A ratio:  0.78 Kardie Tobb DO Electronically signed by Dub Huntsman DO Signature Date/Time: 04/03/2023/11:55:33 AM     Final    CT Head Wo Contrast Result Date: 04/01/2023 CLINICAL DATA:  Syncope/presyncope, cerebrovascular cause suspected EXAM: CT HEAD WITHOUT CONTRAST TECHNIQUE: Contiguous axial images were obtained from the base of the skull through the vertex without intravenous contrast. RADIATION DOSE REDUCTION: This exam was performed according to the departmental dose-optimization program which includes automated exposure control, adjustment of the mA and/or kV according to patient size and/or use of iterative reconstruction technique. COMPARISON:  09/19/2022 FINDINGS: Brain: Age related  volume loss. Chronic small-vessel ischemic changes the white matter. No sign of acute infarction, mass lesion, hemorrhage, hydrocephalus or extra-axial collection. Vascular: There is atherosclerotic calcification of the major vessels at the base of the brain. Skull: Negative Sinuses/Orbits: Clear/normal Other: None IMPRESSION: No acute CT finding. Age related volume loss. Chronic small-vessel ischemic changes of the white matter. Electronically Signed   By: Oneil Officer M.D.   On: 04/01/2023 20:02   CT Angio Chest PE W and/or Wo Contrast Result Date: 04/01/2023 CLINICAL DATA:  Syncope/presyncope, cerebrovascular cause suspected. Chest pain. Possible pulmonary embolism. EXAM: CT ANGIOGRAPHY CHEST WITH CONTRAST TECHNIQUE: Multidetector CT imaging of the chest was performed using the standard protocol during bolus administration of intravenous contrast. Multiplanar CT image reconstructions and MIPs were obtained to evaluate the vascular anatomy. RADIATION DOSE REDUCTION: This exam was performed according to the departmental dose-optimization program which includes automated exposure control, adjustment of the mA and/or kV according to patient size and/or use of iterative reconstruction technique. CONTRAST:  65mL OMNIPAQUE  IOHEXOL  350 MG/ML SOLN COMPARISON:  12/09/2015 CT.  Chest radiography same day. FINDINGS: Cardiovascular: Heart size  upper limits of normal. No right ventricular enlargement. No pericardial fluid. Some coronary artery calcification and aortic atherosclerotic calcification are present. Pulmonary arterial opacification is good. There are no pulmonary emboli. Mediastinum/Nodes: No mediastinal or hilar mass or lymphadenopathy. Lungs/Pleura: Mild dependent atelectasis. No pleural effusion. No acute pneumonia. No mass or nodule. Upper Abdomen: No acute or significant finding. Musculoskeletal: Previous thoracolumbar fusion. Some degenerative change of the disc levels above fusion. No evidence of regional acute fracture. Review of the MIP images confirms the above findings. IMPRESSION: 1. No pulmonary emboli. 2. Coronary artery calcification. Aortic atherosclerotic calcification. 3. Previous thoracolumbar fusion. Some degenerative change of the disc levels above fusion. Electronically Signed   By: Oneil Officer M.D.   On: 04/01/2023 20:00   DG Chest Port 1 View Result Date: 04/01/2023 CLINICAL DATA:  Chest pain EXAM: PORTABLE CHEST 1 VIEW COMPARISON:  02/17/2020 FINDINGS: Stable cardiomediastinal silhouette. Aortic atherosclerotic calcification. Chronic bronchitic changes are similar to prior. No focal pneumonia, pleural effusion, or pneumothorax. Remote left rib fractures. Cervical and thoracolumbar fusion hardware. IMPRESSION: No active disease. Electronically Signed   By: Norman Gatlin M.D.   On: 04/01/2023 17:30   Microbiology: Results for orders placed or performed in visit on 02/11/23  COVID-19, Flu A+B and RSV     Status: None   Collection Time: 02/11/23 11:40 AM   Specimen: Nasopharyngeal(NP) swabs  in vial transport medium  Result Value Ref Range Status   SARS-CoV-2, NAA Not Detected Not Detected Final   Influenza A, NAA Not Detected Not Detected Final   Influenza B, NAA Not Detected Not Detected Final   RSV, NAA Not Detected Not Detected Final   Test Information: Comment  Final    Comment: This nucleic acid  amplification test was developed and its performance characteristics determined by [employer]. Nucleic acid amplification tests include RT-PCR and TMA. This test has not been FDA cleared or approved. This test has been authorized by FDA under an Emergency Use Authorization (EUA). This test is only authorized for the duration of time the declaration that circumstances exist justifying the authorization of the emergency use of in vitro diagnostic tests for detection of SARS-CoV-2 virus and/or diagnosis of COVID-19 infection under section 564(b)(1) of the Act, 21 U.S.C. 639aaa-6(a) (1), unless the authorization is terminated or revoked sooner. When diagnostic testing is negative, the possibility of a false negative result should  be considered in the context of a patient's recent exposures and the presence of clinical signs and symptoms consistent with COVID-19. An individual without symptoms of COVID-19 and who is not shedding SARS-CoV-2 virus wo uld expect to have a negative (not detected) result in this assay.    Labs: CBC: Recent Labs  Lab 04/01/23 1654 04/01/23 1709 04/02/23 0425 04/03/23 0421 04/04/23 0340 04/05/23 0319  WBC 11.4*  --  10.7* 9.3 8.1 8.4  NEUTROABS 8.9*  --   --  6.6 5.5 5.3  HGB 13.3 13.3 11.8* 12.5 13.2 12.4  HCT 39.7 39.0 35.7* 36.5 38.6 37.2  MCV 93.2  --  93.0 91.0 90.6 91.9  PLT 215  --  190 174 197 200   Basic Metabolic Panel: Recent Labs  Lab 04/01/23 1654 04/01/23 1709 04/02/23 0425 04/03/23 0421 04/04/23 0340 04/05/23 0319  NA 140 140 138 137 139 137  K 3.7 3.7 3.6 3.4* 4.1 4.3  CL 106 106 107 107 104 100  CO2 24  --  22 22 24 24   GLUCOSE 111* 108* 93 101* 97 107*  BUN 21 24* 17 10 6* 13  CREATININE 1.02* 1.10* 0.70 0.63 0.60 0.79  CALCIUM 8.7*  --  8.8* 8.7* 9.0 9.4  MG  --   --  1.8 1.9 1.8 2.3  PHOS  --   --   --  2.8 3.3 3.7   Liver Function Tests: Recent Labs  Lab 04/01/23 1654 04/03/23 0421 04/04/23 0340  04/05/23 0319  AST 34 26 23 23   ALT 16 13 12 12   ALKPHOS 54 48 52 50  BILITOT 0.5 1.1 0.9 0.7  PROT 5.9* 5.6* 6.0* 5.8*  ALBUMIN  3.1* 2.8* 3.0* 2.9*   CBG: No results for input(s): GLUCAP in the last 168 hours.  Discharge time spent: greater than 30 minutes.  Signed: Alejandro Marker, DO [facility] 04/05/2023

## 2023-04-05 NOTE — Progress Notes (Signed)
 Progress Note  Patient Name: Kaitlyn Serrano Date of Encounter: 04/05/2023  Primary Cardiologist: Alvan Carrier, MD  Interval Summary   Chart reviewed.  She has ambulated in the hall, doing well without chest pain or significant shortness of breath.  Would like to go home.  Vital Signs    Vitals:   04/04/23 1935 04/04/23 2324 04/05/23 0302 04/05/23 0741  BP: 135/68 (!) 133/93 133/79 (!) 161/73  Pulse: 94 91 77 95  Resp: 20 16 20    Temp: 98.1 F (36.7 C) 98.5 F (36.9 C) 97.9 F (36.6 C) 98.8 F (37.1 C)  TempSrc: Oral Oral Oral Oral  SpO2: 95% 97% 97%   Weight:      Height:        Intake/Output Summary (Last 24 hours) at 04/05/2023 1221 Last data filed at 04/05/2023 9178 Gross per 24 hour  Intake 461.68 ml  Output --  Net 461.68 ml   Filed Weights   04/01/23 1647 04/03/23 1259  Weight: 56.7 kg 52.7 kg    Physical Exam   GEN: No acute distress.   Neck: No JVD. Cardiac: RRR, no murmur, rub, or gallop.  Respiratory: Nonlabored. Clear to auscultation bilaterally. GI: Soft, nontender, bowel sounds present. MS: No edema.  ECG/Telemetry    Telemetry reviewed showing sinus rhythm.  Brief burst of SVT.  Labs    Chemistry Recent Labs  Lab 04/03/23 0421 04/04/23 0340 04/05/23 0319  NA 137 139 137  K 3.4* 4.1 4.3  CL 107 104 100  CO2 22 24 24   GLUCOSE 101* 97 107*  BUN 10 6* 13  CREATININE 0.63 0.60 0.79  CALCIUM 8.7* 9.0 9.4  PROT 5.6* 6.0* 5.8*  ALBUMIN  2.8* 3.0* 2.9*  AST 26 23 23   ALT 13 12 12   ALKPHOS 48 52 50  BILITOT 1.1 0.9 0.7  GFRNONAA >60 >60 >60  ANIONGAP 8 11 13     Hematology Recent Labs  Lab 04/03/23 0421 04/04/23 0340 04/05/23 0319  WBC 9.3 8.1 8.4  RBC 4.01 4.26  4.26 4.05  HGB 12.5 13.2 12.4  HCT 36.5 38.6 37.2  MCV 91.0 90.6 91.9  MCH 31.2 31.0 30.6  MCHC 34.2 34.2 33.3  RDW 13.0 13.4 13.4  PLT 174 197 200   Cardiac Enzymes Recent Labs  Lab 04/01/23 1654 04/01/23 1906  TROPONINIHS 746* 418*   Lipid Panel      Component Value Date/Time   CHOL 151 04/03/2023 0421   CHOL 200 (H) 04/19/2022 1136   TRIG 77 04/03/2023 0421   HDL 56 04/03/2023 0421   HDL 59 04/19/2022 1136   CHOLHDL 2.7 04/03/2023 0421   VLDL 15 04/03/2023 0421   LDLCALC 80 04/03/2023 0421   LDLCALC 110 (H) 04/19/2022 1136   LABVLDL 31 04/19/2022 1136    Cardiac Studies   Left heart catheterization 04/03/2023 Conclusions: Mild, non-obstructive coronary artery disease with 20% stenosis in the mid LAD.  Otherwise, no angiographically significant coronary artery disease identified. Moderately reduced left ventricular systolic function with mid anterior akinesis (LVEF 35-45%).  Question if this represents a Takotsubo variant. Moderately elevated left ventricular filling pressure (LVEDP 25 mmHg). Small bilateral radial arteries that would not allow passage of micropuncture wire above the mid forearms.  Recommend femoral access for future catheterizations.   Recommendations: Medical therapy and risk factor modification to prevent progression of mild coronary artery disease. Gentle diuresis and escalation of goal-directed medical therapy for suspected Takotsubo cardiomyopathy.   Echocardiogram 04/03/2023  1. Left ventricular ejection  fraction, by estimation, is 40 to 45%. The  left ventricle has mildly decreased function. The left ventricle  demonstrates global hypokinesis. There is mild concentric left ventricular  hypertrophy. Left ventricular diastolic  function could not be evaluated.   2. Right ventricular systolic function is normal. The right ventricular  size is normal. Tricuspid regurgitation signal is inadequate for assessing  PA pressure.   3. The mitral valve is normal in structure. Mild to moderate mitral valve  regurgitation. No evidence of mitral stenosis. Moderate mitral annular  calcification.   4. The aortic valve is normal in structure. Aortic valve regurgitation is  mild to moderate. No aortic stenosis is  present.   5. The inferior vena cava is normal in size with greater than 50%  respiratory variability, suggesting right atrial pressure of 3 mmHg.   Assessment & Plan   1.  HFmrEF, nonischemic etiology with cardiac catheterization on February 6 demonstrating mild coronary atherosclerosis.  Wall motion abnormality and elevation in high-sensitivity troponin I to 746 suggest possibility of stress-induced cardiomyopathy or myocarditis.  She is clinically stable at this time with plan for GDMT and outpatient follow-up.  2.  Primary hypertension.  3.  Mixed hyperlipidemia.  Reports statin intolerance and currently on Zetia .  4.  Valvular heart disease including mild to moderate mitral regurgitation and mild to moderate aortic regurgitation.  5.  Dementia.  Current regimen includes Cozaar , Aldactone , and Zetia .  She had been on atenolol  at home, would replace this with Coreg  6.25 mg twice daily.  Can arrange follow-up with primary cardiologist or APP in the next 7 to 10 days.  Make sure that she knows to stop prior atenolol , hydralazine , and lisinopril .  Further GDMT can be adjusted as an outpatient.  For questions or updates, please contact [facility] Please consult [email or web address] for contact info under   Signed, Jayson Sierras, MD  04/05/2023, 12:21 PM

## 2023-04-07 ENCOUNTER — Telehealth: Payer: Self-pay

## 2023-04-07 ENCOUNTER — Ambulatory Visit: Payer: Medicare HMO | Admitting: Family Medicine

## 2023-04-07 DIAGNOSIS — I2510 Atherosclerotic heart disease of native coronary artery without angina pectoris: Secondary | ICD-10-CM | POA: Insufficient documentation

## 2023-04-07 LAB — LIPOPROTEIN A (LPA): Lipoprotein (a): <8.4 nmol/L (ref <75.0)

## 2023-04-07 NOTE — Progress Notes (Deleted)
 Subjective: CC:*** PCP: Kaitlyn Ip, DO [id number] Kaitlyn Serrano is a 83 y.o. female presenting to clinic today for:  1. ***   ROS: Per HPI  Allergies  Allergen Reactions   Bee Venom Anaphylaxis   Penicillins Anaphylaxis and Rash   Amlodipine Swelling   Elavil [Amitriptyline Hcl] Other (See Comments)    Confusion, hallucinations   Statins Swelling and Other (See Comments)    Peeling of skin   Past Medical History:  Diagnosis Date   Anxiety    Benzodiazepine dependence (HCC) 12/30/2017   Chronic pain syndrome    Complication of anesthesia    Depression    Diverticulosis    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   normal. family unaware   Dysphagia    Dyspnea    Generalized weakness    GERD (gastroesophageal reflux disease)    GERD (gastroesophageal reflux disease)    High cholesterol    Hoarseness    Hx of cardiovascular stress test    negative 2012 and 2016   Hypertension    Insomnia    Intervertebral disc disorder with myelopathy, lumbar region    Lumbago    Lumbar spine pain    PONV (postoperative nausea and vomiting)    confusion after anesthesia   Renal insufficiency    Ringing in ears    resolved   Spondylosis with myelopathy, lumbar region    legs, back    Current Outpatient Medications:    aspirin EC 81 MG tablet, Take 81 mg by mouth daily. Swallow whole., Disp: , Rfl:    busPIRone (BUSPAR) 10 MG tablet, Take 2 tablets (20 mg total) by mouth 2 (two) times daily. (Patient taking differently: Take 10 mg by mouth 3 (three) times daily.), Disp: 360 tablet, Rfl: 3   carvedilol (COREG) 6.25 MG tablet, Take 1 tablet (6.25 mg total) by mouth 2 (two) times daily., Disp: 60 tablet, Rfl: 11   donepezil (ARICEPT) 10 MG tablet, Take 1 tablet (10 mg total) by mouth at bedtime. For dementia, Disp: 90 tablet, Rfl: 3   ezetimibe (ZETIA) 10 MG tablet, TAKE ONE TABLET BY MOUTH DAILY AT 9AM (Patient taking differently: Take 10 mg by mouth daily.), Disp: 15 tablet, Rfl: 0    HYDROcodone-acetaminophen (NORCO) 10-325 MG tablet, Take 1 tablet by mouth every 4 (four) hours as needed for severe pain. (Patient not taking: Reported on 04/02/2023), Disp: , Rfl:    losartan (COZAAR) 100 MG tablet, Take 1 tablet (100 mg total) by mouth daily., Disp: 30 tablet, Rfl: 0   mirtazapine (REMERON) 15 MG tablet, Take 1 tablet (15 mg total) by mouth at bedtime. For anxiety/ appetite/ sleep, Disp: 90 tablet, Rfl: 3   pantoprazole (PROTONIX) 40 MG tablet, Take 1 tablet (40 mg total) by mouth 2 (two) times daily., Disp: 180 tablet, Rfl: 3   polyethylene glycol (MIRALAX / GLYCOLAX) 17 g packet, Take 17 g by mouth 2 (two) times daily. (Patient taking differently: Take 17 g by mouth daily as needed for mild constipation.), Disp: 14 each, Rfl: 0   spironolactone (ALDACTONE) 25 MG tablet, Take 0.5 tablets (12.5 mg total) by mouth daily., Disp: 15 tablet, Rfl: 0   traZODone (DESYREL) 50 MG tablet, Take 50 mg by mouth at bedtime., Disp: , Rfl:  Social History   Socioeconomic History   Marital status: Divorced    Spouse name: Not on file   Number of children: 2   Years of education: Not on file   Highest education level: Not on  file  Occupational History   Not on file  Tobacco Use   Smoking status: Former    Current packs/day: 0.00    Average packs/day: 4.0 packs/day for 3.7 years (14.8 ttl pk-yrs)    Types: Cigarettes    Start date: 06/10/1960    Quit date: 02/20/1964    Years since quitting: 59.1   Smokeless tobacco: Never  Vaping Use   Vaping status: Never Used  Substance and Sexual Activity   Alcohol use: No    Alcohol/week: 0.0 standard drinks of alcohol   Drug use: No   Sexual activity: Not Currently    Birth control/protection: None  Other Topics Concern   Not on file  Social History Narrative   ** Merged History Encounter **       ** Merged History Encounter **  Kaitlyn Serrano lives alone. She is separated/divorced from her second husband and widowed by her first husband. She  has twin daughters, Kaitlyn Serrano and Kaitlyn Serrano. Kaitlyn Serrano has been a cause of stress for her and she believes was preven   ting her from getting medical care that she needed. She is no longer talking to Kaitlyn Serrano. She states that Kaitlyn Serrano is involved in her care and that she trusts her to have her best interest in mind.      Social Drivers of Corporate investment banker Strain: Low Risk  (07/11/2022)   Overall Financial Resource Strain (CARDIA)    Difficulty of Paying Living Expenses: Not hard at all  Food Insecurity: No Food Insecurity (04/03/2023)   Hunger Vital Sign    Worried About Running Out of Food in the Last Year: Never true    Ran Out of Food in the Last Year: Never true  Transportation Needs: No Transportation Needs (04/03/2023)   PRAPARE - Administrator, Civil Service (Medical): No    Lack of Transportation (Non-Medical): No  Physical Activity: Insufficiently Active (07/11/2022)   Exercise Vital Sign    Days of Exercise per Week: 3 days    Minutes of Exercise per Session: 30 min  Stress: No Stress Concern Present (07/11/2022)   [name] - Occupational Stress Questionnaire    Feeling of Stress : Not at all  Social Connections: Socially Isolated (04/03/2023)   Social Connection and Isolation Panel [NHANES]    Frequency of Communication with Friends and Family: More than three times a week    Frequency of Social Gatherings with Friends and Family: More than three times a week    Attends Religious Services: Never    Database administrator or Organizations: No    Attends Banker Meetings: Never    Marital Status: Widowed  Intimate Partner Violence: Not At Risk (04/03/2023)   Humiliation, Afraid, Rape, and Kick questionnaire    Fear of Current or Ex-Partner: No    Emotionally Abused: No    Physically Abused: No    Sexually Abused: No   Family History  Problem Relation Age of Onset   Diabetes Mother    Heart disease Mother     Hypertension Mother    Gallbladder disease Mother    Heart disease Father    Pneumonia Sister    Breast cancer Paternal Grandmother    Hypertension Brother    Hyperlipidemia Brother    Hypertension Child    Gallbladder disease Other     Objective: Office vital signs reviewed. There were no vitals taken for this visit.  Physical Examination:  General: Awake,  alert, *** nourished, No acute distress HEENT: Normal    Neck: No masses palpated. No lymphadenopathy    Ears: Tympanic membranes intact, normal light reflex, no erythema, no bulging    Eyes: PERRLA, extraocular membranes intact, sclera ***    Nose: nasal turbinates moist, *** nasal discharge    Throat: moist mucus membranes, no erythema, *** tonsillar exudate.  Airway is patent Cardio: regular rate and rhythm, S1S2 heard, no murmurs appreciated Pulm: clear to auscultation bilaterally, no wheezes, rhonchi or rales; normal work of breathing on room air GI: soft, non-tender, non-distended, bowel sounds present x4, no hepatomegaly, no splenomegaly, no masses GU: external vaginal tissue ***, cervix ***, *** punctate lesions on cervix appreciated, *** discharge from cervical os, *** bleeding, *** cervical motion tenderness, *** abdominal/ adnexal masses Extremities: warm, well perfused, No edema, cyanosis or clubbing; +*** pulses bilaterally MSK: *** gait and *** station Skin: dry; intact; no rashes or lesions Neuro: *** Strength and light touch sensation grossly intact, *** DTRs ***/4  Assessment/ Plan: 83 y.o. female   ***  No orders of the defined types were placed in this encounter.  No orders of the defined types were placed in this encounter.   Kaitlyn Ip, DO [facility] [phone]

## 2023-04-07 NOTE — Transitions of Care (Post Inpatient/ED Visit) (Signed)
   04/07/2023  Name: Kaitlyn Serrano MRN: [id number] DOB: 1940-10-23  Today's TOC FU Call Status: Today's TOC FU Call Status:: Unsuccessful Call (1st Attempt) Unsuccessful Call (1st Attempt) Date: 04/07/23  Attempted to reach the patient regarding the most recent Inpatient/ED visit.  Follow Up Plan: Additional outreach attempts will be made to reach the patient to complete the Transitions of Care (Post Inpatient/ED visit) call.   Signature  Agnes Lawrence, CMA (AAMA)  [facility] [phone]

## 2023-04-09 ENCOUNTER — Encounter (HOSPITAL_COMMUNITY): Payer: Self-pay

## 2023-04-09 ENCOUNTER — Other Ambulatory Visit: Payer: Self-pay

## 2023-04-09 ENCOUNTER — Ambulatory Visit: Payer: Self-pay | Admitting: Family Medicine

## 2023-04-09 ENCOUNTER — Emergency Department (HOSPITAL_COMMUNITY): Payer: Medicare HMO

## 2023-04-09 ENCOUNTER — Observation Stay (HOSPITAL_COMMUNITY)
Admission: EM | Admit: 2023-04-09 | Discharge: 2023-04-10 | Disposition: A | Discharge status: Home / Self Care | Payer: Medicare HMO | Attending: Internal Medicine | Admitting: Internal Medicine

## 2023-04-09 DIAGNOSIS — Z96642 Presence of left artificial hip joint: Secondary | ICD-10-CM | POA: Diagnosis not present

## 2023-04-09 DIAGNOSIS — I2510 Atherosclerotic heart disease of native coronary artery without angina pectoris: Secondary | ICD-10-CM | POA: Diagnosis not present

## 2023-04-09 DIAGNOSIS — Z87891 Personal history of nicotine dependence: Secondary | ICD-10-CM | POA: Insufficient documentation

## 2023-04-09 DIAGNOSIS — R55 Syncope and collapse: Principal | ICD-10-CM | POA: Diagnosis present

## 2023-04-09 DIAGNOSIS — I10 Essential (primary) hypertension: Secondary | ICD-10-CM | POA: Insufficient documentation

## 2023-04-09 DIAGNOSIS — J9811 Atelectasis: Secondary | ICD-10-CM | POA: Diagnosis not present

## 2023-04-09 DIAGNOSIS — F419 Anxiety disorder, unspecified: Secondary | ICD-10-CM | POA: Insufficient documentation

## 2023-04-09 DIAGNOSIS — F32A Depression, unspecified: Secondary | ICD-10-CM | POA: Diagnosis not present

## 2023-04-09 DIAGNOSIS — M8588 Other specified disorders of bone density and structure, other site: Secondary | ICD-10-CM | POA: Diagnosis not present

## 2023-04-09 DIAGNOSIS — Z79899 Other long term (current) drug therapy: Secondary | ICD-10-CM | POA: Diagnosis not present

## 2023-04-09 DIAGNOSIS — Z7982 Long term (current) use of aspirin: Secondary | ICD-10-CM | POA: Insufficient documentation

## 2023-04-09 DIAGNOSIS — I700 Atherosclerosis of aorta: Secondary | ICD-10-CM | POA: Diagnosis not present

## 2023-04-09 DIAGNOSIS — M5134 Other intervertebral disc degeneration, thoracic region: Secondary | ICD-10-CM | POA: Diagnosis not present

## 2023-04-09 DIAGNOSIS — M47814 Spondylosis without myelopathy or radiculopathy, thoracic region: Secondary | ICD-10-CM | POA: Diagnosis not present

## 2023-04-09 DIAGNOSIS — R531 Weakness: Secondary | ICD-10-CM | POA: Diagnosis not present

## 2023-04-09 DIAGNOSIS — E785 Hyperlipidemia, unspecified: Secondary | ICD-10-CM | POA: Insufficient documentation

## 2023-04-09 DIAGNOSIS — N179 Acute kidney failure, unspecified: Secondary | ICD-10-CM | POA: Insufficient documentation

## 2023-04-09 DIAGNOSIS — M549 Dorsalgia, unspecified: Secondary | ICD-10-CM | POA: Diagnosis not present

## 2023-04-09 DIAGNOSIS — M791 Myalgia, unspecified site: Secondary | ICD-10-CM | POA: Diagnosis not present

## 2023-04-09 DIAGNOSIS — K219 Gastro-esophageal reflux disease without esophagitis: Secondary | ICD-10-CM | POA: Insufficient documentation

## 2023-04-09 DIAGNOSIS — F411 Generalized anxiety disorder: Secondary | ICD-10-CM

## 2023-04-09 DIAGNOSIS — M5106 Intervertebral disc disorders with myelopathy, lumbar region: Secondary | ICD-10-CM | POA: Diagnosis not present

## 2023-04-09 DIAGNOSIS — K5730 Diverticulosis of large intestine without perforation or abscess without bleeding: Secondary | ICD-10-CM | POA: Diagnosis not present

## 2023-04-09 DIAGNOSIS — M4804 Spinal stenosis, thoracic region: Secondary | ICD-10-CM | POA: Diagnosis not present

## 2023-04-09 DIAGNOSIS — R0602 Shortness of breath: Secondary | ICD-10-CM | POA: Diagnosis not present

## 2023-04-09 DIAGNOSIS — S2242XA Multiple fractures of ribs, left side, initial encounter for closed fracture: Secondary | ICD-10-CM | POA: Diagnosis not present

## 2023-04-09 DIAGNOSIS — Z981 Arthrodesis status: Secondary | ICD-10-CM | POA: Diagnosis not present

## 2023-04-09 DIAGNOSIS — G894 Chronic pain syndrome: Secondary | ICD-10-CM | POA: Diagnosis not present

## 2023-04-09 HISTORY — DX: Syncope and collapse: R55

## 2023-04-09 LAB — CBC WITH DIFFERENTIAL/PLATELET
Abs Immature Granulocytes: 0.04 10*3/uL (ref 0.00–0.07)
Basophils Absolute: 0.1 10*3/uL (ref 0.0–0.1)
Basophils Relative: 1 %
Eosinophils Absolute: 0.3 10*3/uL (ref 0.0–0.5)
Eosinophils Relative: 6 %
HCT: 38.8 % (ref 36.0–46.0)
Hemoglobin: 13 g/dL (ref 12.0–15.0)
Immature Granulocytes: 1 %
Lymphocytes Relative: 16 %
Lymphs Abs: 1 10*3/uL (ref 0.7–4.0)
MCH: 31.9 pg (ref 26.0–34.0)
MCHC: 33.5 g/dL (ref 30.0–36.0)
MCV: 95.1 fL (ref 80.0–100.0)
Monocytes Absolute: 0.7 10*3/uL (ref 0.1–1.0)
Monocytes Relative: 12 %
Neutro Abs: 3.8 10*3/uL (ref 1.7–7.7)
Neutrophils Relative %: 64 %
Platelets: 255 10*3/uL (ref 150–400)
RBC: 4.08 MIL/uL (ref 3.87–5.11)
RDW: 12.7 % (ref 11.5–15.5)
WBC: 5.9 10*3/uL (ref 4.0–10.5)
nRBC: 0 % (ref 0.0–0.2)

## 2023-04-09 LAB — BASIC METABOLIC PANEL
Anion gap: 10 (ref 5–15)
BUN: 20 mg/dL (ref 8–23)
CO2: 28 mmol/L (ref 22–32)
Calcium: 10.1 mg/dL (ref 8.9–10.3)
Chloride: 97 mmol/L — ABNORMAL LOW (ref 98–111)
Creatinine, Ser: 1.18 mg/dL — ABNORMAL HIGH (ref 0.44–1.00)
GFR, Estimated: 46 mL/min — ABNORMAL LOW (ref >60)
Glucose, Bld: 145 mg/dL — ABNORMAL HIGH (ref 70–99)
Potassium: 4.7 mmol/L (ref 3.5–5.1)
Sodium: 135 mmol/L (ref 135–145)

## 2023-04-09 LAB — CBG MONITORING, ED
Glucose-Capillary: 153 mg/dL — ABNORMAL HIGH (ref 70–99)
Glucose-Capillary: 106 mg/dL — ABNORMAL HIGH (ref 70–99)

## 2023-04-09 LAB — BRAIN NATRIURETIC PEPTIDE: B Natriuretic Peptide: 110 pg/mL — ABNORMAL HIGH (ref 0.0–100.0)

## 2023-04-09 LAB — TROPONIN I (HIGH SENSITIVITY)
Troponin I (High Sensitivity): 26 ng/L — ABNORMAL HIGH (ref <18)
Troponin I (High Sensitivity): 27 ng/L — ABNORMAL HIGH (ref <18)

## 2023-04-09 MED ORDER — LIDOCAINE 5 % EX PTCH
1.0000 | MEDICATED_PATCH | CUTANEOUS | Status: DC
  Administered 2023-04-09 – 2023-04-10 (×2): 1 via TRANSDERMAL
  Filled 2023-04-09 (×2): qty 1

## 2023-04-09 MED ORDER — CELECOXIB 100 MG PO CAPS
200.0000 mg | ORAL_CAPSULE | Freq: Two times a day (BID) | ORAL | Status: DC
  Administered 2023-04-09 – 2023-04-10 (×2): 200 mg via ORAL
  Filled 2023-04-09 (×3): qty 2

## 2023-04-09 MED ORDER — PANTOPRAZOLE SODIUM 40 MG PO TBEC
40.0000 mg | DELAYED_RELEASE_TABLET | Freq: Two times a day (BID) | ORAL | Status: DC
  Administered 2023-04-09 – 2023-04-10 (×2): 40 mg via ORAL
  Filled 2023-04-09 (×2): qty 1

## 2023-04-09 MED ORDER — MIRTAZAPINE 15 MG PO TABS
15.0000 mg | ORAL_TABLET | Freq: Every day | ORAL | Status: DC
  Administered 2023-04-09: 15 mg via ORAL
  Filled 2023-04-09: qty 1

## 2023-04-09 MED ORDER — BUSPIRONE HCL 5 MG PO TABS
10.0000 mg | ORAL_TABLET | Freq: Three times a day (TID) | ORAL | Status: DC
  Administered 2023-04-09 – 2023-04-10 (×3): 10 mg via ORAL
  Filled 2023-04-09 (×3): qty 2

## 2023-04-09 MED ORDER — ASPIRIN 81 MG PO TBEC: 81.0000 mg | DELAYED_RELEASE_TABLET | Freq: Every day | ORAL | Status: DC

## 2023-04-09 MED ORDER — ACETAMINOPHEN 325 MG PO TABS: 650.0000 mg | ORAL_TABLET | Freq: Four times a day (QID) | ORAL | Status: DC | PRN

## 2023-04-09 MED ORDER — ACETAMINOPHEN 650 MG RE SUPP: 650.0000 mg | Freq: Four times a day (QID) | RECTAL | Status: DC | PRN

## 2023-04-09 MED ORDER — EZETIMIBE 10 MG PO TABS
10.0000 mg | ORAL_TABLET | Freq: Every day | ORAL | Status: DC
  Administered 2023-04-10: 10 mg via ORAL
  Filled 2023-04-09: qty 1

## 2023-04-09 MED ORDER — TRAZODONE HCL 50 MG PO TABS
25.0000 mg | ORAL_TABLET | Freq: Every evening | ORAL | Status: DC | PRN
  Administered 2023-04-09: 25 mg via ORAL
  Filled 2023-04-09: qty 1

## 2023-04-09 MED ORDER — SODIUM CHLORIDE 0.9 % IV SOLN: INTRAVENOUS | Status: DC

## 2023-04-09 MED ORDER — LOSARTAN POTASSIUM 50 MG PO TABS
100.0000 mg | ORAL_TABLET | Freq: Every day | ORAL | Status: DC
  Administered 2023-04-10: 100 mg via ORAL
  Filled 2023-04-09: qty 2

## 2023-04-09 MED ORDER — MAGNESIUM HYDROXIDE 400 MG/5ML PO SUSP
30.0000 mL | Freq: Every day | ORAL | Status: DC | PRN
  Administered 2023-04-09: 30 mL via ORAL
  Filled 2023-04-09: qty 30

## 2023-04-09 MED ORDER — POLYETHYLENE GLYCOL 3350 17 G PO PACK: 17.0000 g | PACK | Freq: Every day | ORAL | Status: DC | PRN

## 2023-04-09 MED ORDER — DONEPEZIL HCL 5 MG PO TABS
10.0000 mg | ORAL_TABLET | Freq: Every day | ORAL | Status: DC
  Administered 2023-04-09: 10 mg via ORAL
  Filled 2023-04-09: qty 2

## 2023-04-09 MED ORDER — METHOCARBAMOL 500 MG PO TABS
500.0000 mg | ORAL_TABLET | Freq: Three times a day (TID) | ORAL | Status: DC | PRN
Start: 2023-04-09 — End: 2023-04-10
  Administered 2023-04-10: 500 mg via ORAL
  Filled 2023-04-09: qty 1

## 2023-04-09 MED ORDER — IOHEXOL 350 MG/ML SOLN
100.0000 mL | Freq: Once | INTRAVENOUS | Status: AC | PRN
  Administered 2023-04-09: 100 mL via INTRAVENOUS

## 2023-04-09 MED ORDER — ASPIRIN 81 MG PO TBEC
81.0000 mg | DELAYED_RELEASE_TABLET | Freq: Every day | ORAL | Status: DC
  Administered 2023-04-09 – 2023-04-10 (×2): 81 mg via ORAL
  Filled 2023-04-09 (×2): qty 1

## 2023-04-09 MED ORDER — SPIRONOLACTONE 12.5 MG HALF TABLET
12.5000 mg | ORAL_TABLET | Freq: Every day | ORAL | Status: DC
  Administered 2023-04-10: 12.5 mg via ORAL
  Filled 2023-04-09: qty 1

## 2023-04-09 MED ORDER — HYDROCODONE-ACETAMINOPHEN 10-325 MG PO TABS
1.0000 | ORAL_TABLET | ORAL | Status: DC | PRN
  Administered 2023-04-09: 1 via ORAL
  Filled 2023-04-09: qty 1

## 2023-04-09 MED ORDER — CARVEDILOL 3.125 MG PO TABS
6.2500 mg | ORAL_TABLET | Freq: Two times a day (BID) | ORAL | Status: DC
  Administered 2023-04-09 – 2023-04-10 (×2): 6.25 mg via ORAL
  Filled 2023-04-09 (×2): qty 2

## 2023-04-09 MED ORDER — ENOXAPARIN SODIUM 30 MG/0.3ML IJ SOSY
30.0000 mg | PREFILLED_SYRINGE | INTRAMUSCULAR | Status: DC
  Administered 2023-04-09: 30 mg via SUBCUTANEOUS
  Filled 2023-04-09: qty 0.3

## 2023-04-09 MED ORDER — TRAZODONE HCL 50 MG PO TABS: 50.0000 mg | ORAL_TABLET | Freq: Every day | ORAL | Status: DC

## 2023-04-09 NOTE — ED Provider Notes (Signed)
[facility] Provider Note   CSN: [id number] Arrival date & time: 04/09/23  1244     History  Chief Complaint  Patient presents with   Loss of Consciousness    Kaitlyn Serrano is a 83 y.o. female with a history significant for hypertension, hyperlipidemia, GERD, chronic low back pain and mild dementia presenting for evaluation of syncope which occurred just prior to arrival.  She had seen her back pain doctor this morning, felt well, came home and was in the middle of eating a late breakfast when she started to feel very "heavy" and passed out sitting at her kitchen table.  This episode was witnessed by her home health nurse, and patient denies falling out of her chair during this episode.  She denies chest pain, she does have some mild shortness of breath and also describes pain in her left shoulder and arm, described as a heavy sensation.  She was actually discharged from an inpatient admission 4 days ago during which time she had a near syncopal event.  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  There was concern about possible mild Takotsubo variant as there were some mid anterior akinesis during the study.  She was started on carvedilol in place of atenolol, losartan and spironolactone upon discharge and was told to DC hydralazine the lisinopril as well.  New info from family, dg now at bedside. Pt initially passed out at the table,  then she was getting walked to her bedroom when she passed out again, w as caught, no fall, but endorses pain in her mid to lower back since the second incident.  She denies prodrome,  except "knew she was going down" but can't describe. DG states she was out for 2+ minutes and they were unable to find a pulse until she started responding.   The history is provided by the  patient.       Home Medications Prior to Admission medications   Medication Sig Start Date End Date Taking? Authorizing Provider  aspirin EC 81 MG tablet Take 81 mg by mouth daily. Swallow whole.   Yes [provider]  busPIRone (BUSPAR) 10 MG tablet Take 2 tablets (20 mg total) by mouth 2 (two) times daily. Patient taking differently: Take 10 mg by mouth 3 (three) times daily. 06/24/22  Yes Gottschalk, Kathie Rhodes M, DO  carvedilol (COREG) 6.25 MG tablet Take 1 tablet (6.25 mg total) by mouth 2 (two) times daily. 04/05/23 04/04/24 Yes Sheikh, Omair Latif, DO  celecoxib (CELEBREX) 200 MG capsule Take 200 mg by mouth 2 (two) times daily.   Yes [provider]  donepezil (ARICEPT) 10 MG tablet Take 1 tablet (10 mg total) by mouth at bedtime. For dementia 02/07/23  Yes Delynn Flavin M, DO  ezetimibe (ZETIA) 10 MG tablet TAKE ONE TABLET BY MOUTH DAILY AT 9AM Patient taking differently: Take 10 mg by mouth daily. 02/24/23  Yes Branch, Dorothe Pea, MD  HYDROcodone-acetaminophen (NORCO) 10-325 MG tablet Take 1 tablet by mouth every 4 (four) hours as needed for severe pain (pain score 7-10).   Yes [provider]  losartan (COZAAR) 100 MG tablet Take 1 tablet (100 mg total) by mouth daily. 04/06/23  Yes Sheikh, Omair Latif, DO  mirtazapine (REMERON) 15 MG tablet Take 1 tablet (15 mg total) by mouth at bedtime. For anxiety/  appetite/ sleep 06/24/22  Yes Delynn Flavin M, DO  pantoprazole (PROTONIX) 40 MG tablet Take 1 tablet (40 mg total) by mouth 2 (two) times daily. 02/05/23  Yes Gottschalk, Kathie Rhodes M, DO  polyethylene glycol (MIRALAX / GLYCOLAX) 17 g packet Take 17 g by mouth 2 (two) times daily. Patient taking differently: Take 17 g by mouth daily as needed for mild constipation. 05/15/22  Yes Cassandria Anger, PA-C  spironolactone (ALDACTONE) 25 MG tablet Take 0.5 tablets (12.5 mg total) by mouth daily. 04/06/23  Yes Sheikh, Omair Latif, DO  traZODone (DESYREL) 50 MG tablet Take 50  mg by mouth at bedtime.   Yes [provider]      Allergies    Bee venom, Penicillins, Amlodipine, Elavil [amitriptyline hcl], and Statins    Review of Systems   Review of Systems  Constitutional:  Positive for fatigue. Negative for fever.  HENT:  Negative for congestion and sore throat.   Eyes: Negative.   Respiratory:  Negative for chest tightness and shortness of breath.   Cardiovascular:  Negative for chest pain.  Gastrointestinal:  Negative for abdominal pain, nausea and vomiting.  Genitourinary: Negative.   Musculoskeletal:  Positive for back pain. Negative for arthralgias, joint swelling and neck pain.  Skin: Negative.  Negative for rash and wound.  Neurological:  Positive for syncope. Negative for dizziness, weakness, light-headedness, numbness and headaches.  Psychiatric/Behavioral: Negative.    All other systems reviewed and are negative.   Physical Exam Updated Vital Signs BP 124/63   Pulse 80   Temp 97.6 F (36.4 C) (Oral)   Resp 17   Ht 5\' 2"  (1.575 m)   Wt 59 kg   SpO2 94%   BMI 23.78 kg/m  Physical Exam Vitals and nursing note reviewed.  Constitutional:      Appearance: She is well-developed.  HENT:     Head: Normocephalic and atraumatic.  Eyes:     Conjunctiva/sclera: Conjunctivae normal.  Cardiovascular:     Rate and Rhythm: Normal rate and regular rhythm.     Heart sounds: Normal heart sounds.  Pulmonary:     Effort: Pulmonary effort is normal.     Breath sounds: Normal breath sounds. No wheezing.  Abdominal:     General: Bowel sounds are normal.     Palpations: Abdomen is soft.     Tenderness: There is no abdominal tenderness.  Musculoskeletal:        General: Normal range of motion.     Cervical back: Normal range of motion.  Skin:    General: Skin is warm and dry.     Findings: Bruising present.     Comments: Bruising bilateral forearms from recent IVs with hospitalization.  Neurological:     General: No focal deficit present.      Mental Status: She is alert.     Cranial Nerves: No cranial nerve deficit.     Sensory: No sensory deficit.     Motor: Weakness present.     Comments: Generalized weakness without focal weakness.     ED Results / Procedures / Treatments   Labs (all labs ordered are listed, but only abnormal results are displayed) Labs Reviewed  BASIC METABOLIC PANEL - Abnormal; Notable for the following components:      Result Value   Chloride 97 (*)    Glucose, Bld 145 (*)    Creatinine, Ser 1.18 (*)    GFR, Estimated 46 (*)    All other components within normal limits  BRAIN NATRIURETIC PEPTIDE - Abnormal; Notable for the following components:   B Natriuretic Peptide 110.0 (*)    All other components within normal limits  CBG MONITORING, ED - Abnormal; Notable for the following components:   Glucose-Capillary 153 (*)    All other components within normal limits  CBG MONITORING, ED - Abnormal; Notable for the following components:   Glucose-Capillary 106 (*)    All other components within normal limits  TROPONIN I (HIGH SENSITIVITY) - Abnormal; Notable for the following components:   Troponin I (High Sensitivity) 26 (*)    All other components within normal limits  TROPONIN I (HIGH SENSITIVITY) - Abnormal; Notable for the following components:   Troponin I (High Sensitivity) 27 (*)    All other components within normal limits  CBC WITH DIFFERENTIAL/PLATELET  BASIC METABOLIC PANEL  CBC    EKG EKG Interpretation Date/Time:  Wednesday April 09 2023 13:12:37 EST Ventricular Rate:  66 PR Interval:    QRS Duration:  106 QT Interval:  450 QTC Calculation: 472 R Axis:   229  Text Interpretation: Sinus rhythm Markedly posterior QRS axis Artifact in lead(s) I III aVR aVL aVF Confirmed by Gloris Manchester ([id number]) on 04/09/2023 7:20:06 PM  Radiology CT T-SPINE NO CHARGE Result Date: 04/09/2023 CLINICAL DATA:  Syncope, back pain EXAM: CT THORACIC SPINE WITHOUT CONTRAST TECHNIQUE:  Multidetector CT images of the thoracic were obtained using the standard protocol without intravenous contrast. RADIATION DOSE REDUCTION: This exam was performed according to the departmental dose-optimization program which includes automated exposure control, adjustment of the mA and/or kV according to patient size and/or use of iterative reconstruction technique. COMPARISON:  CTA chest, abdomen and pelvis today FINDINGS: Alignment: Normal Vertebrae: No acute fracture or focal pathologic process. Paraspinal and other soft tissues: Negative Disc levels: Posterior fusion changes seen in the lower thoracic spine extending into the lumbar spine, described on lumbar spine CT today. 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 IMPRESSION: No acute bony abnormality. Postoperative and degenerative changes. Electronically Signed   By: Charlett Nose M.D.   On: 04/09/2023 18:40   CT L-SPINE NO CHARGE Result Date: 04/09/2023 CLINICAL DATA:  Syncope.  Back pain. EXAM: CT LUMBAR SPINE WITHOUT CONTRAST TECHNIQUE: Multidetector CT imaging of the lumbar spine was performed without intravenous contrast administration. Multiplanar CT image reconstructions were also generated. RADIATION DOSE REDUCTION: This exam was performed according to the departmental dose-optimization program which includes automated exposure control, adjustment of the mA and/or kV according to patient size and/or use of iterative reconstruction technique. COMPARISON:  CT chest, abdomen and pelvis today FINDINGS: Segmentation: 5 lumbar type vertebrae. Alignment: Normal Vertebrae: Osteopenia. No acute bony abnormality or suspicious focal osseous lesion. Paraspinal and other soft tissues: Negative Disc levels: Prior posterior fusion from T10-S1. No visible complicating feature. IMPRESSION: Posterior fusion changes from T10-S1. No acute bony abnormality. Electronically Signed   By: Charlett Nose  M.D.   On: 04/09/2023 18:39   CT Angio Chest/Abd/Pel for Dissection W and/or Wo Contrast Result Date: 04/09/2023 CLINICAL DATA:  Syncope.  Back pain. EXAM: CT ANGIOGRAPHY CHEST, ABDOMEN AND PELVIS TECHNIQUE: Non-contrast CT of the chest was initially obtained. Multidetector CT imaging through the chest, abdomen and pelvis was performed using the standard protocol during bolus administration of intravenous contrast. Multiplanar reconstructed images and MIPs were obtained and reviewed to evaluate the vascular anatomy. RADIATION DOSE REDUCTION: This exam was performed according to the departmental dose-optimization program which includes  automated exposure control, adjustment of the mA and/or kV according to patient size and/or use of iterative reconstruction technique. CONTRAST:  OMNIPAQUE IOHEXOL 350 MG/ML SOLN COMPARISON:  04/01/2023 FINDINGS: CTA CHEST FINDINGS Cardiovascular: No filling defects in the pulmonary arteries to suggest pulmonary emboli. Heart is normal size. Aorta is normal caliber. Aortic atherosclerosis. No dissection. Mediastinum/Nodes: No mediastinal, hilar, or axillary adenopathy. Trachea and esophagus are unremarkable. Thyroid unremarkable. Lungs/Pleura: No acute confluent airspace opacity or effusion. Musculoskeletal: Chest wall soft tissues are unremarkable. Review of the MIP images confirms the above findings. CTA ABDOMEN AND PELVIS FINDINGS VASCULAR Aorta: Normal caliber aorta without aneurysm, dissection, vasculitis or significant stenosis. Scattered moderate calcifications. Celiac: Patent without evidence of aneurysm, dissection, vasculitis or significant stenosis. SMA: Patent without evidence of aneurysm, dissection, vasculitis or significant stenosis. Renals: 2 left and 1 right renal artery, patent. No evidence of aneurysm, dissection, or vasculitis IMA: Patent without evidence of aneurysm, dissection, vasculitis or significant stenosis. Inflow: Patent without evidence of  aneurysm, dissection, vasculitis or significant stenosis. Scattered atherosclerotic calcifications. Veins: No obvious venous abnormality within the limitations of this arterial phase study. Review of the MIP images confirms the above findings. NON-VASCULAR Hepatobiliary: No focal hepatic abnormality. Gallbladder unremarkable. Pancreas: No focal abnormality or ductal dilatation. Spleen: No focal abnormality.  Normal size. Adrenals/Urinary Tract: No adrenal abnormality. No focal renal abnormality. No stones or hydronephrosis. Urinary bladder obscured by beam hardening artifact from bilateral hip replacements. Stomach/Bowel: Colonic diverticulosis, most pronounced in the left colon. No active diverticulitis. Stomach and small bowel decompressed. No obstruction or inflammatory process. Lymphatic: No adenopathy. Reproductive: Obscured by beam hardening artifact from bilateral hip replacements. Other: No free fluid or free air. Musculoskeletal: Bilateral hip replacements. Posterior fusion changes from T10-S1. No acute bony abnormality. Review of the MIP images confirms the above findings. IMPRESSION: No evidence of pulmonary embolus, aortic aneurysm or aortic dissection. Aortic atherosclerosis. No acute cardiopulmonary disease. No acute findings in the abdomen or pelvis. Colonic diverticulosis.  No active diverticulitis. Electronically Signed   By: Charlett Nose M.D.   On: 04/09/2023 18:35   DG Chest Portable 1 View Result Date: 04/09/2023 CLINICAL DATA:  Syncope. EXAM: PORTABLE CHEST 1 VIEW COMPARISON:  Chest radiograph dated 04/01/2023. FINDINGS: Stable cardiomediastinal silhouette. Aortic atherosclerosis. Overlapping telemetry wires. No focal consolidation, sizeable pleural effusion, or pneumothorax. Remote left rib fractures. Cervical and thoracolumbar fusion hardware. No acute osseous abnormality. IMPRESSION: Mild left basilar atelectasis. Otherwise, no acute cardiopulmonary findings. Electronically Signed   By:  Hart Robinsons M.D.   On: 04/09/2023 15:26    Procedures Procedures    Medications Ordered in ED Medications  lidocaine (LIDODERM) 5 % 1 patch (1 patch Transdermal Patch Applied 04/09/23 1613)  enoxaparin (LOVENOX) injection 30 mg (30 mg Subcutaneous Given 04/09/23 2219)  0.9 %  sodium chloride infusion ( Intravenous Rate/Dose Verify 04/09/23 2315)  acetaminophen (TYLENOL) tablet 650 mg (has no administration in time range)    Or  acetaminophen (TYLENOL) suppository 650 mg (has no administration in time range)  magnesium hydroxide (MILK OF MAGNESIA) suspension 30 mL (30 mLs Oral Given 04/09/23 2012)  traZODone (DESYREL) tablet 25 mg (25 mg Oral Given 04/09/23 2013)  aspirin EC tablet 81 mg (81 mg Oral Given 04/09/23 2012)  celecoxib (CELEBREX) capsule 200 mg (200 mg Oral Given 04/09/23 2256)  HYDROcodone-acetaminophen (NORCO) 10-325 MG per tablet 1 tablet (1 tablet Oral Given 04/09/23 2257)  carvedilol (COREG) tablet 6.25 mg (6.25 mg Oral Given 04/09/23 2256)  ezetimibe (ZETIA) tablet 10  mg (has no administration in time range)  losartan (COZAAR) tablet 100 mg (has no administration in time range)  spironolactone (ALDACTONE) tablet 12.5 mg (has no administration in time range)  busPIRone (BUSPAR) tablet 10 mg (10 mg Oral Given 04/09/23 2256)  donepezil (ARICEPT) tablet 10 mg (10 mg Oral Given 04/09/23 2255)  mirtazapine (REMERON) tablet 15 mg (15 mg Oral Given 04/09/23 2257)  traZODone (DESYREL) tablet 50 mg (has no administration in time range)  pantoprazole (PROTONIX) EC tablet 40 mg (40 mg Oral Given 04/09/23 2257)  polyethylene glycol (MIRALAX / GLYCOLAX) packet 17 g (has no administration in time range)  methocarbamol (ROBAXIN) tablet 500 mg (has no administration in time range)  iohexol (OMNIPAQUE) 350 MG/ML injection 100 mL (100 mLs Intravenous Contrast Given 04/09/23 1634)    ED Course/ Medical Decision Making/ A&P                                 Medical Decision Making Patient  presented with 2 episodes of syncope today, with a recent hospitalization with similar presentation, had cardiac catheterization during that hospitalization with no significant coronary disease but she did have elevated troponins with  suggestion of Takotsubo syndrome.  Her delta troponins are neg, significantly improved since her recent hospitalization, delta troponin is pending.  Other labs are reassuring, however she does have a mild bump in her BNP at 110, not of clinical significance.  She had significant mid thoracic back pain which was not reproducible on my exam after her second episode of syncope, CT imaging was performed to rule out stress fracture and also to rule out dissection, this was negative.  Patient will need admission and probable cardiology consult in the morning.  Amount and/or Complexity of Data Reviewed Labs: ordered.    Details: Mild AKI with creatinine of 1.18, borderline elevated BNP at 110, delta troponins are flat at 27 and 28, her last troponin collected 8 days ago was greater than 400. Radiology: ordered.    Details: CT imaging as outlined above. ECG/medicine tests: ordered.    Details: NSR,  rate 66,   Discussion of management or test interpretation with external provider(s): Call placed to hospitalist for admission.  Risk Decision regarding hospitalization.           Final Clinical Impression(s) / ED Diagnoses Final diagnoses:  Syncope and collapse  AKI (acute kidney injury) [facility])    Rx / DC Orders ED Discharge Orders     None         Victoriano Lain 04/09/23 2353    Coral Spikes, DO 04/10/23 1600

## 2023-04-09 NOTE — H&P (Incomplete)
[place]   PATIENT NAME: Kaitlyn Serrano    MR#:  [id number]  DATE OF BIRTH:  1940-12-25  DATE OF ADMISSION:  04/09/2023  PRIMARY CARE PHYSICIAN: Raliegh Ip, DO   Patient is coming from: ***  REQUESTING/REFERRING PHYSICIAN: ***  CHIEF COMPLAINT:   Chief Complaint  Patient presents with   Loss of Consciousness    HISTORY OF PRESENT ILLNESS:  Kaitlyn Serrano is a 83 y.o. female with medical history significant for ***  ED Course: *** EKG as reviewed by me : *** Imaging: *** PAST MEDICAL HISTORY:   Past Medical History:  Diagnosis Date   Anxiety    Benzodiazepine dependence (HCC) 12/30/2017   Chronic pain syndrome    Complication of anesthesia    Depression    Diverticulosis    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   normal. family unaware   Dysphagia    Dyspnea    Generalized weakness    GERD (gastroesophageal reflux disease)    GERD (gastroesophageal reflux disease)    High cholesterol    Hoarseness    Hx of cardiovascular stress test    negative 2012 and 2016   Hypertension    Insomnia    Intervertebral disc disorder with myelopathy, lumbar region    Lumbago    Lumbar spine pain    PONV (postoperative nausea and vomiting)    confusion after anesthesia   Renal insufficiency    Ringing in ears    resolved   Spondylosis with myelopathy, lumbar region    legs, back    PAST SURGICAL HISTORY:   Past Surgical History:  Procedure Laterality Date   ABDOMINAL HYSTERECTOMY     BACK SURGERY     BONE MARROW ASPIRATION     CERVICAL SPINE SURGERY     EYE SURGERY Bilateral    Lasic   FIXATION KYPHOPLASTY     LAMINOTOMY     LEFT HEART CATH AND CORONARY ANGIOGRAPHY N/A 04/03/2023   Procedure: LEFT HEART CATH AND CORONARY ANGIOGRAPHY;  Surgeon: Yvonne Kendall, MD;  Location: [facility];  Service: Cardiovascular;  Laterality: N/A;   SPINAL FUSION     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   Procedure: TOTAL HIP ARTHROPLASTY ANTERIOR APPROACH;   Surgeon: Durene Romans, MD;  Location: [facility];  Service: Orthopedics;  Laterality: Right;  70 mins   TOTAL HIP ARTHROPLASTY Left 05/14/2022   Procedure: TOTAL HIP ARTHROPLASTY ANTERIOR APPROACH;  Surgeon: Durene Romans, MD;  Location: [facility];  Service: Orthopedics;  Laterality: Left;    SOCIAL HISTORY:   Social History   Tobacco Use   Smoking status: Former    Current packs/day: 0.00    Average packs/day: 4.0 packs/day for 3.7 years (14.8 ttl pk-yrs)    Types: Cigarettes    Start date: 06/10/1960    Quit date: 02/20/1964    Years since quitting: 59.1   Smokeless tobacco: Never  Substance Use Topics   Alcohol use: No    Alcohol/week: 0.0 standard drinks of alcohol    FAMILY HISTORY:   Family History  Problem Relation Age of Onset   Diabetes Mother    Heart disease Mother    Hypertension Mother    Gallbladder disease Mother    Heart disease Father    Pneumonia Sister    Breast cancer Paternal Grandmother    Hypertension Brother    Hyperlipidemia Brother    Hypertension Child    Gallbladder disease Other     DRUG ALLERGIES:  Allergies  Allergen Reactions   Bee Venom Anaphylaxis   Penicillins Anaphylaxis and Rash   Amlodipine Swelling   Elavil [Amitriptyline Hcl] Other (See Comments)    Confusion, hallucinations   Statins Swelling and Other (See Comments)    Peeling of skin    REVIEW OF SYSTEMS:   ROS As per history of present illness. All pertinent systems were reviewed above. Constitutional, HEENT, cardiovascular, respiratory, GI, GU, musculoskeletal, neuro, psychiatric, endocrine, integumentary and hematologic systems were reviewed and are otherwise negative/unremarkable except for positive findings mentioned above in the HPI.   MEDICATIONS AT HOME:   Prior to Admission medications   Medication Sig Start Date End Date Taking? Authorizing Provider  aspirin EC 81 MG tablet Take 81 mg by mouth daily. Swallow whole.   Yes [provider]  busPIRone  (BUSPAR) 10 MG tablet Take 2 tablets (20 mg total) by mouth 2 (two) times daily. Patient taking differently: Take 10 mg by mouth 3 (three) times daily. 06/24/22  Yes Gottschalk, Kathie Rhodes M, DO  carvedilol (COREG) 6.25 MG tablet Take 1 tablet (6.25 mg total) by mouth 2 (two) times daily. 04/05/23 04/04/24 Yes Sheikh, Omair Latif, DO  celecoxib (CELEBREX) 200 MG capsule Take 200 mg by mouth 2 (two) times daily.   Yes [provider]  donepezil (ARICEPT) 10 MG tablet Take 1 tablet (10 mg total) by mouth at bedtime. For dementia 02/07/23  Yes Delynn Flavin M, DO  ezetimibe (ZETIA) 10 MG tablet TAKE ONE TABLET BY MOUTH DAILY AT 9AM Patient taking differently: Take 10 mg by mouth daily. 02/24/23  Yes Branch, Dorothe Pea, MD  HYDROcodone-acetaminophen (NORCO) 10-325 MG tablet Take 1 tablet by mouth every 4 (four) hours as needed for severe pain (pain score 7-10).   Yes [provider]  losartan (COZAAR) 100 MG tablet Take 1 tablet (100 mg total) by mouth daily. 04/06/23  Yes Sheikh, Omair Latif, DO  mirtazapine (REMERON) 15 MG tablet Take 1 tablet (15 mg total) by mouth at bedtime. For anxiety/ appetite/ sleep 06/24/22  Yes Delynn Flavin M, DO  pantoprazole (PROTONIX) 40 MG tablet Take 1 tablet (40 mg total) by mouth 2 (two) times daily. 02/05/23  Yes Gottschalk, Kathie Rhodes M, DO  polyethylene glycol (MIRALAX / GLYCOLAX) 17 g packet Take 17 g by mouth 2 (two) times daily. Patient taking differently: Take 17 g by mouth daily as needed for mild constipation. 05/15/22  Yes Cassandria Anger, PA-C  spironolactone (ALDACTONE) 25 MG tablet Take 0.5 tablets (12.5 mg total) by mouth daily. 04/06/23  Yes Sheikh, Omair Latif, DO  traZODone (DESYREL) 50 MG tablet Take 50 mg by mouth at bedtime.   Yes [provider]      VITAL SIGNS:  Blood pressure 136/74, pulse 76, temperature 98.2 F (36.8 C), temperature source Axillary, resp. rate 13, height 5\' 2"  (1.575 m), weight 59 kg, SpO2 91%.  PHYSICAL  EXAMINATION:  Physical Exam  GENERAL:  83 y.o.-year-old patient lying in the bed with no acute distress.  EYES: Pupils equal, round, reactive to light and accommodation. No scleral icterus. Extraocular muscles intact.  HEENT: Head atraumatic, normocephalic. Oropharynx and nasopharynx clear.  NECK:  Supple, no jugular venous distention. No thyroid enlargement, no tenderness.  LUNGS: Normal breath sounds bilaterally, no wheezing, rales,rhonchi or crepitation. No use of accessory muscles of respiration.  CARDIOVASCULAR: Regular rate and rhythm, S1, S2 normal. No murmurs, rubs, or gallops.  ABDOMEN: Soft, nondistended, nontender. Bowel sounds present. No organomegaly or mass.  EXTREMITIES: No  pedal edema, cyanosis, or clubbing.  NEUROLOGIC: Cranial nerves II through XII are intact. Muscle strength 5/5 in all extremities. Sensation intact. Gait not checked.  PSYCHIATRIC: The patient is alert and oriented x 3.  Normal affect and good eye contact. SKIN: No obvious rash, lesion, or ulcer.   LABORATORY PANEL:   CBC Recent Labs  Lab 04/09/23 1325  WBC 5.9  HGB 13.0  HCT 38.8  PLT 255   ------------------------------------------------------------------------------------------------------------------  Chemistries  Recent Labs  Lab 04/05/23 0319 04/09/23 1325  NA 137 135  K 4.3 4.7  CL 100 97*  CO2 24 28  GLUCOSE 107* 145*  BUN 13 20  CREATININE 0.79 1.18*  CALCIUM 9.4 10.1  MG 2.3  --   AST 23  --   ALT 12  --   ALKPHOS 50  --   BILITOT 0.7  --    ------------------------------------------------------------------------------------------------------------------  Cardiac Enzymes No results for input(s): "TROPONINI" in the last 168 hours. ------------------------------------------------------------------------------------------------------------------  RADIOLOGY:  CT T-SPINE NO CHARGE Result Date: 04/09/2023 CLINICAL DATA:  Syncope, back pain EXAM: CT THORACIC SPINE WITHOUT  CONTRAST TECHNIQUE: Multidetector CT images of the thoracic were obtained using the standard protocol without intravenous contrast. RADIATION DOSE REDUCTION: This exam was performed according to the departmental dose-optimization program which includes automated exposure control, adjustment of the mA and/or kV according to patient size and/or use of iterative reconstruction technique. COMPARISON:  CTA chest, abdomen and pelvis today FINDINGS: Alignment: Normal Vertebrae: No acute fracture or focal pathologic process. Paraspinal and other soft tissues: Negative Disc levels: Posterior fusion changes seen in the lower thoracic spine extending into the lumbar spine, described on lumbar spine CT today. 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 IMPRESSION: No acute bony abnormality. Postoperative and degenerative changes. Electronically Signed   By: Charlett Nose M.D.   On: 04/09/2023 18:40   CT L-SPINE NO CHARGE Result Date: 04/09/2023 CLINICAL DATA:  Syncope.  Back pain. EXAM: CT LUMBAR SPINE WITHOUT CONTRAST TECHNIQUE: Multidetector CT imaging of the lumbar spine was performed without intravenous contrast administration. Multiplanar CT image reconstructions were also generated. RADIATION DOSE REDUCTION: This exam was performed according to the departmental dose-optimization program which includes automated exposure control, adjustment of the mA and/or kV according to patient size and/or use of iterative reconstruction technique. COMPARISON:  CT chest, abdomen and pelvis today FINDINGS: Segmentation: 5 lumbar type vertebrae. Alignment: Normal Vertebrae: Osteopenia. No acute bony abnormality or suspicious focal osseous lesion. Paraspinal and other soft tissues: Negative Disc levels: Prior posterior fusion from T10-S1. No visible complicating feature. IMPRESSION: Posterior fusion changes from T10-S1. No acute bony abnormality. Electronically Signed    By: Charlett Nose M.D.   On: 04/09/2023 18:39   CT Angio Chest/Abd/Pel for Dissection W and/or Wo Contrast Result Date: 04/09/2023 CLINICAL DATA:  Syncope.  Back pain. EXAM: CT ANGIOGRAPHY CHEST, ABDOMEN AND PELVIS TECHNIQUE: Non-contrast CT of the chest was initially obtained. Multidetector CT imaging through the chest, abdomen and pelvis was performed using the standard protocol during bolus administration of intravenous contrast. Multiplanar reconstructed images and MIPs were obtained and reviewed to evaluate the vascular anatomy. RADIATION DOSE REDUCTION: This exam was performed according to the departmental dose-optimization program which includes automated exposure control, adjustment of the mA and/or kV according to patient size and/or use of iterative reconstruction technique. CONTRAST:  OMNIPAQUE IOHEXOL 350 MG/ML SOLN COMPARISON:  04/01/2023 FINDINGS: CTA CHEST FINDINGS Cardiovascular: No filling defects in the pulmonary  arteries to suggest pulmonary emboli. Heart is normal size. Aorta is normal caliber. Aortic atherosclerosis. No dissection. Mediastinum/Nodes: No mediastinal, hilar, or axillary adenopathy. Trachea and esophagus are unremarkable. Thyroid unremarkable. Lungs/Pleura: No acute confluent airspace opacity or effusion. Musculoskeletal: Chest wall soft tissues are unremarkable. Review of the MIP images confirms the above findings. CTA ABDOMEN AND PELVIS FINDINGS VASCULAR Aorta: Normal caliber aorta without aneurysm, dissection, vasculitis or significant stenosis. Scattered moderate calcifications. Celiac: Patent without evidence of aneurysm, dissection, vasculitis or significant stenosis. SMA: Patent without evidence of aneurysm, dissection, vasculitis or significant stenosis. Renals: 2 left and 1 right renal artery, patent. No evidence of aneurysm, dissection, or vasculitis IMA: Patent without evidence of aneurysm, dissection, vasculitis or significant stenosis. Inflow: Patent without  evidence of aneurysm, dissection, vasculitis or significant stenosis. Scattered atherosclerotic calcifications. Veins: No obvious venous abnormality within the limitations of this arterial phase study. Review of the MIP images confirms the above findings. NON-VASCULAR Hepatobiliary: No focal hepatic abnormality. Gallbladder unremarkable. Pancreas: No focal abnormality or ductal dilatation. Spleen: No focal abnormality.  Normal size. Adrenals/Urinary Tract: No adrenal abnormality. No focal renal abnormality. No stones or hydronephrosis. Urinary bladder obscured by beam hardening artifact from bilateral hip replacements. Stomach/Bowel: Colonic diverticulosis, most pronounced in the left colon. No active diverticulitis. Stomach and small bowel decompressed. No obstruction or inflammatory process. Lymphatic: No adenopathy. Reproductive: Obscured by beam hardening artifact from bilateral hip replacements. Other: No free fluid or free air. Musculoskeletal: Bilateral hip replacements. Posterior fusion changes from T10-S1. No acute bony abnormality. Review of the MIP images confirms the above findings. IMPRESSION: No evidence of pulmonary embolus, aortic aneurysm or aortic dissection. Aortic atherosclerosis. No acute cardiopulmonary disease. No acute findings in the abdomen or pelvis. Colonic diverticulosis.  No active diverticulitis. Electronically Signed   By: Charlett Nose M.D.   On: 04/09/2023 18:35   DG Chest Portable 1 View Result Date: 04/09/2023 CLINICAL DATA:  Syncope. EXAM: PORTABLE CHEST 1 VIEW COMPARISON:  Chest radiograph dated 04/01/2023. FINDINGS: Stable cardiomediastinal silhouette. Aortic atherosclerosis. Overlapping telemetry wires. No focal consolidation, sizeable pleural effusion, or pneumothorax. Remote left rib fractures. Cervical and thoracolumbar fusion hardware. No acute osseous abnormality. IMPRESSION: Mild left basilar atelectasis. Otherwise, no acute cardiopulmonary findings. Electronically  Signed   By: Hart Robinsons M.D.   On: 04/09/2023 15:26      IMPRESSION AND PLAN:  Assessment and Plan: No notes have been filed under this hospital service. Service: Hospitalist      DVT prophylaxis: Lovenox***  Advanced Care Planning:  Code Status: full code***  Family Communication:  The plan of care was discussed in details with the patient (and family). I answered all questions. The patient agreed to proceed with the above mentioned plan. Further management will depend upon hospital course. Disposition Plan: Back to previous home environment Consults called: none***  All the records are reviewed and case discussed with ED provider.  Status is: Observation {Observation:23811}   At the time of the admission, it appears that the appropriate admission status for this patient is inpatient.  This is judged to be reasonable and necessary in order to provide the required intensity of service to ensure the patient's safety given the presenting symptoms, physical exam findings and initial radiographic and laboratory data in the context of comorbid conditions.  The patient requires inpatient status due to high intensity of service, high risk of further deterioration and high frequency of surveillance required.  I certify that at the time of admission, it is my clinical judgment  that the patient will require inpatient hospital care extending more than 2 midnights.                            Dispo: The patient is from: Home              Anticipated d/c is to: Home              Patient currently is not medically stable to d/c.              Difficult to place patient: No  Hannah Beat M.D on 04/09/2023 at 10:58 PM  [facility]   From 7 PM-7 AM, contact night-coverage [email or web address]  CC: Primary care physician; Raliegh Ip, DO

## 2023-04-09 NOTE — ED Triage Notes (Addendum)
Pt stated she was eating breakfast after visit with back doctor and told home health nurse "I am about to pass out." EMS stated she fell over and was held by the nurse. Pt denies having a headache or dizziness before passing out. Pt had a STEMI x8 days ago. Catheterization was x8 days ago per home health nurse. Bruising bilaterally to upper extremities from cath attempt. Pt was ambulatory before LOC. Pt stated I just feel week now and I am really itchy, my tongue and mouth feel numb. No apparent rashes seen on pt. Pt is A&O x4 completing full sentences. Also stated seeing black spots. Denies ever having this happen before.  Provider at bedside.

## 2023-04-09 NOTE — H&P (Incomplete)
[place]     [place]   PATIENT NAME: Kaitlyn Serrano    MR#:  [id number]  DATE OF BIRTH:  1941/01/21  DATE OF ADMISSION:  04/09/2023  PRIMARY CARE PHYSICIAN: Raliegh Ip, DO   Patient is coming from: Home  REQUESTING/REFERRING PHYSICIAN: Victoriano Lain  CHIEF COMPLAINT:   Chief Complaint  Patient presents with  . Loss of Consciousness    HISTORY OF PRESENT ILLNESS:  Kaitlyn Serrano is a 83 y.o. female with medical history significant for Zaidi, depression, GERD, dyslipidemia and hypertension, who presented to the emergency room with acute onset of syncope twice today.  She stated that she felt heavy before that.  She admits to palpitations with her syncope but denied any chest pain.  She denied any paresthesias or focal muscle weakness.  No leg pain or edema or recent travels or surgeries.  No cough or wheezing or hemoptysis.  No dysuria, oliguria or hematuria or flank pain.  No bleeding diathesis.  No fever or chills.  ED Course: When the patient came to the ER, BP was 170/87 with heart rate of 59, respiratory rate of 22 with temperature of 97.8 and pulse oximetry of 96% on room air.  Labs revealed hypokalemia of 97 with a creatinine of 1.18 compared to 0.794 days ago.  BNP was 110 and high sensitive troponin I was 26 and later 27.  CBC was within normal.  EKG as reviewed by me : EKG showed normal sinus rhythm with rate of 66 and poor R wave progression. Imaging: Portable chest x-ray showed mild left basal atelectasis with no acute cardiopulmonary disease.  The patient will be admitted to an observation medical telemetry bed for further evaluation and management. PAST MEDICAL HISTORY:   Past Medical History:  Diagnosis Date  . Anxiety   . Benzodiazepine dependence (HCC) 12/30/2017  . Chronic pain syndrome   . Complication of anesthesia   . Depression   . Diverticulosis   . 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 family unaware  . Dysphagia   . Dyspnea   .  Generalized weakness   . GERD (gastroesophageal reflux disease)   . GERD (gastroesophageal reflux disease)   . High cholesterol   . Hoarseness   . Hx of cardiovascular stress test    negative 2012 and 2016  . Hypertension   . Insomnia   . Intervertebral disc disorder with myelopathy, lumbar region   . Lumbago   . Lumbar spine pain   . PONV (postoperative nausea and vomiting)    confusion after anesthesia  . Renal insufficiency   . Ringing in ears    resolved  . Spondylosis with myelopathy, lumbar region    legs, back    PAST SURGICAL HISTORY:   Past Surgical History:  Procedure Laterality Date  . ABDOMINAL HYSTERECTOMY    . BACK SURGERY    . BONE MARROW ASPIRATION    . CERVICAL SPINE SURGERY    . EYE SURGERY Bilateral    Lasic  . FIXATION KYPHOPLASTY    . LAMINOTOMY    . LEFT HEART CATH AND CORONARY ANGIOGRAPHY N/A 04/03/2023   Procedure: LEFT HEART CATH AND CORONARY ANGIOGRAPHY;  Surgeon: Yvonne Kendall, MD;  Location: [facility];  Service: Cardiovascular;  Laterality: N/A;  . SPINAL FUSION    . 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   Procedure: TOTAL HIP ARTHROPLASTY ANTERIOR APPROACH;  Surgeon: Durene Romans, MD;  Location: [facility];  Service: Orthopedics;  Laterality: Right;  70  mins  . TOTAL HIP ARTHROPLASTY Left 05/14/2022   Procedure: TOTAL HIP ARTHROPLASTY ANTERIOR APPROACH;  Surgeon: Durene Romans, MD;  Location: [facility];  Service: Orthopedics;  Laterality: Left;    SOCIAL HISTORY:   Social History   Tobacco Use  . Smoking status: Former    Current packs/day: 0.00    Average packs/day: 4.0 packs/day for 3.7 years (14.8 ttl pk-yrs)    Types: Cigarettes    Start date: 06/10/1960    Quit date: 02/20/1964    Years since quitting: 59.1  . Smokeless tobacco: Never  Substance Use Topics  . Alcohol use: No    Alcohol/week: 0.0 standard drinks of alcohol    FAMILY HISTORY:   Family History  Problem Relation Age of Onset  . Diabetes Mother   . Heart  disease Mother   . Hypertension Mother   . Gallbladder disease Mother   . Heart disease Father   . Pneumonia Sister   . Breast cancer Paternal Grandmother   . Hypertension Brother   . Hyperlipidemia Brother   . Hypertension Child   . Gallbladder disease Other     DRUG ALLERGIES:   Allergies  Allergen Reactions  . Bee Venom Anaphylaxis  . Penicillins Anaphylaxis and Rash  . Amlodipine Swelling  . Elavil [Amitriptyline Hcl] Other (See Comments)    Confusion, hallucinations  . Statins Swelling and Other (See Comments)    Peeling of skin    REVIEW OF SYSTEMS:   ROS As per history of present illness. All pertinent systems were reviewed above. Constitutional, HEENT, cardiovascular, respiratory, GI, GU, musculoskeletal, neuro, psychiatric, endocrine, integumentary and hematologic systems were reviewed and are otherwise negative/unremarkable except for positive findings mentioned above in the HPI.   MEDICATIONS AT HOME:   Prior to Admission medications   Medication Sig Start Date End Date Taking? Authorizing Provider  aspirin EC 81 MG tablet Take 81 mg by mouth daily. Swallow whole.   Yes [provider]  busPIRone (BUSPAR) 10 MG tablet Take 2 tablets (20 mg total) by mouth 2 (two) times daily. Patient taking differently: Take 10 mg by mouth 3 (three) times daily. 06/24/22  Yes Gottschalk, Kathie Rhodes M, DO  carvedilol (COREG) 6.25 MG tablet Take 1 tablet (6.25 mg total) by mouth 2 (two) times daily. 04/05/23 04/04/24 Yes Sheikh, Omair Latif, DO  celecoxib (CELEBREX) 200 MG capsule Take 200 mg by mouth 2 (two) times daily.   Yes [provider]  donepezil (ARICEPT) 10 MG tablet Take 1 tablet (10 mg total) by mouth at bedtime. For dementia 02/07/23  Yes Delynn Flavin M, DO  ezetimibe (ZETIA) 10 MG tablet TAKE ONE TABLET BY MOUTH DAILY AT 9AM Patient taking differently: Take 10 mg by mouth daily. 02/24/23  Yes Branch, Dorothe Pea, MD  HYDROcodone-acetaminophen (NORCO)  10-325 MG tablet Take 1 tablet by mouth every 4 (four) hours as needed for severe pain (pain score 7-10).   Yes [provider]  losartan (COZAAR) 100 MG tablet Take 1 tablet (100 mg total) by mouth daily. 04/06/23  Yes Sheikh, Omair Latif, DO  mirtazapine (REMERON) 15 MG tablet Take 1 tablet (15 mg total) by mouth at bedtime. For anxiety/ appetite/ sleep 06/24/22  Yes Delynn Flavin M, DO  pantoprazole (PROTONIX) 40 MG tablet Take 1 tablet (40 mg total) by mouth 2 (two) times daily. 02/05/23  Yes Gottschalk, Kathie Rhodes M, DO  polyethylene glycol (MIRALAX / GLYCOLAX) 17 g packet Take 17 g by mouth 2 (two) times daily. Patient taking differently:  Take 17 g by mouth daily as needed for mild constipation. 05/15/22  Yes Cassandria Anger, PA-C  spironolactone (ALDACTONE) 25 MG tablet Take 0.5 tablets (12.5 mg total) by mouth daily. 04/06/23  Yes Sheikh, Omair Latif, DO  traZODone (DESYREL) 50 MG tablet Take 50 mg by mouth at bedtime.   Yes [provider]      VITAL SIGNS:  Blood pressure 136/74, pulse 76, temperature 98.2 F (36.8 C), temperature source Axillary, resp. rate 13, height 5\' 2"  (1.575 m), weight 59 kg, SpO2 91%.  PHYSICAL EXAMINATION:  Physical Exam  GENERAL:  83 y.o.-year-old patient lying in the bed with no acute distress.  EYES: Pupils equal, round, reactive to light and accommodation. No scleral icterus. Extraocular muscles intact.  HEENT: Head atraumatic, normocephalic. Oropharynx and nasopharynx clear.  NECK:  Supple, no jugular venous distention. No thyroid enlargement, no tenderness.  LUNGS: Normal breath sounds bilaterally, no wheezing, rales,rhonchi or crepitation. No use of accessory muscles of respiration.  CARDIOVASCULAR: Regular rate and rhythm, S1, S2 normal. No murmurs, rubs, or gallops.  ABDOMEN: Soft, nondistended, nontender. Bowel sounds present. No organomegaly or mass.  EXTREMITIES: No pedal edema, cyanosis, or clubbing.  NEUROLOGIC: Cranial nerves  II through XII are intact. Muscle strength 5/5 in all extremities. Sensation intact. Gait not checked.  PSYCHIATRIC: The patient is alert and oriented x 3.  Normal affect and good eye contact. SKIN: No obvious rash, lesion, or ulcer.   LABORATORY PANEL:   CBC Recent Labs  Lab 04/09/23 1325  WBC 5.9  HGB 13.0  HCT 38.8  PLT 255   ------------------------------------------------------------------------------------------------------------------  Chemistries  Recent Labs  Lab 04/05/23 0319 04/09/23 1325  NA 137 135  K 4.3 4.7  CL 100 97*  CO2 24 28  GLUCOSE 107* 145*  BUN 13 20  CREATININE 0.79 1.18*  CALCIUM 9.4 10.1  MG 2.3  --   AST 23  --   ALT 12  --   ALKPHOS 50  --   BILITOT 0.7  --    ------------------------------------------------------------------------------------------------------------------  Cardiac Enzymes No results for input(s): "TROPONINI" in the last 168 hours. ------------------------------------------------------------------------------------------------------------------  RADIOLOGY:  CT T-SPINE NO CHARGE Result Date: 04/09/2023 CLINICAL DATA:  Syncope, back pain EXAM: CT THORACIC SPINE WITHOUT CONTRAST TECHNIQUE: Multidetector CT images of the thoracic were obtained using the standard protocol without intravenous contrast. RADIATION DOSE REDUCTION: This exam was performed according to the departmental dose-optimization program which includes automated exposure control, adjustment of the mA and/or kV according to patient size and/or use of iterative reconstruction technique. COMPARISON:  CTA chest, abdomen and pelvis today FINDINGS: Alignment: Normal Vertebrae: No acute fracture or focal pathologic process. Paraspinal and other soft tissues: Negative Disc levels: Posterior fusion changes seen in the lower thoracic spine extending into the lumbar spine, described on lumbar spine CT today. Degenerative disc disease at T8-9 and T9-10 with disc space  narrowing, endplate sclerosis and anterior spurring. IMPRESSION: No acute bony abnormality. Postoperative and degenerative changes. Electronically Signed   By: Charlett Nose M.D.   On: 04/09/2023 18:40   CT L-SPINE NO CHARGE Result Date: 04/09/2023 CLINICAL DATA:  Syncope.  Back pain. EXAM: CT LUMBAR SPINE WITHOUT CONTRAST TECHNIQUE: Multidetector CT imaging of the lumbar spine was performed without intravenous contrast administration. Multiplanar CT image reconstructions were also generated. RADIATION DOSE REDUCTION: This exam was performed according to the departmental dose-optimization program which includes automated exposure control, adjustment of the mA and/or kV according to patient size and/or use of iterative  reconstruction technique. COMPARISON:  CT chest, abdomen and pelvis today FINDINGS: Segmentation: 5 lumbar type vertebrae. Alignment: Normal Vertebrae: Osteopenia. No acute bony abnormality or suspicious focal osseous lesion. Paraspinal and other soft tissues: Negative Disc levels: Prior posterior fusion from T10-S1. No visible complicating feature. IMPRESSION: Posterior fusion changes from T10-S1. No acute bony abnormality. Electronically Signed   By: Charlett Nose M.D.   On: 04/09/2023 18:39   CT Angio Chest/Abd/Pel for Dissection W and/or Wo Contrast Result Date: 04/09/2023 CLINICAL DATA:  Syncope.  Back pain. EXAM: CT ANGIOGRAPHY CHEST, ABDOMEN AND PELVIS TECHNIQUE: Non-contrast CT of the chest was initially obtained. Multidetector CT imaging through the chest, abdomen and pelvis was performed using the standard protocol during bolus administration of intravenous contrast. Multiplanar reconstructed images and MIPs were obtained and reviewed to evaluate the vascular anatomy. RADIATION DOSE REDUCTION: This exam was performed according to the departmental dose-optimization program which includes automated exposure control, adjustment of the mA and/or kV according to patient size and/or use of  iterative reconstruction technique. CONTRAST:  OMNIPAQUE IOHEXOL 350 MG/ML SOLN COMPARISON:  04/01/2023 FINDINGS: CTA CHEST FINDINGS Cardiovascular: No filling defects in the pulmonary arteries to suggest pulmonary emboli. Heart is normal size. Aorta is normal caliber. Aortic atherosclerosis. No dissection. Mediastinum/Nodes: No mediastinal, hilar, or axillary adenopathy. Trachea and esophagus are unremarkable. Thyroid unremarkable. Lungs/Pleura: No acute confluent airspace opacity or effusion. Musculoskeletal: Chest wall soft tissues are unremarkable. Review of the MIP images confirms the above findings. CTA ABDOMEN AND PELVIS FINDINGS VASCULAR Aorta: Normal caliber aorta without aneurysm, dissection, vasculitis or significant stenosis. Scattered moderate calcifications. Celiac: Patent without evidence of aneurysm, dissection, vasculitis or significant stenosis. SMA: Patent without evidence of aneurysm, dissection, vasculitis or significant stenosis. Renals: 2 left and 1 right renal artery, patent. No evidence of aneurysm, dissection, or vasculitis IMA: Patent without evidence of aneurysm, dissection, vasculitis or significant stenosis. Inflow: Patent without evidence of aneurysm, dissection, vasculitis or significant stenosis. Scattered atherosclerotic calcifications. Veins: No obvious venous abnormality within the limitations of this arterial phase study. Review of the MIP images confirms the above findings. NON-VASCULAR Hepatobiliary: No focal hepatic abnormality. Gallbladder unremarkable. Pancreas: No focal abnormality or ductal dilatation. Spleen: No focal abnormality.  Normal size. Adrenals/Urinary Tract: No adrenal abnormality. No focal renal abnormality. No stones or hydronephrosis. Urinary bladder obscured by beam hardening artifact from bilateral hip replacements. Stomach/Bowel: Colonic diverticulosis, most pronounced in the left colon. No active diverticulitis. Stomach and small bowel decompressed.  No obstruction or inflammatory process. Lymphatic: No adenopathy. Reproductive: Obscured by beam hardening artifact from bilateral hip replacements. Other: No free fluid or free air. Musculoskeletal: Bilateral hip replacements. Posterior fusion changes from T10-S1. No acute bony abnormality. Review of the MIP images confirms the above findings. IMPRESSION: No evidence of pulmonary embolus, aortic aneurysm or aortic dissection. Aortic atherosclerosis. No acute cardiopulmonary disease. No acute findings in the abdomen or pelvis. Colonic diverticulosis.  No active diverticulitis. Electronically Signed   By: Charlett Nose M.D.   On: 04/09/2023 18:35   DG Chest Portable 1 View Result Date: 04/09/2023 CLINICAL DATA:  Syncope. EXAM: PORTABLE CHEST 1 VIEW COMPARISON:  Chest radiograph dated 04/01/2023. FINDINGS: Stable cardiomediastinal silhouette. Aortic atherosclerosis. Overlapping telemetry wires. No focal consolidation, sizeable pleural effusion, or pneumothorax. Remote left rib fractures. Cervical and thoracolumbar fusion hardware. No acute osseous abnormality. IMPRESSION: Mild left basilar atelectasis. Otherwise, no acute cardiopulmonary findings. Electronically Signed   By: Hart Robinsons M.D.   On: 04/09/2023 15:26      IMPRESSION  AND PLAN:  Assessment and Plan: No notes have been filed under this hospital service. Service: Hospitalist      DVT prophylaxis: Lovenox.  Advanced Care Planning:  Code Status: full code.  This was discussed with the patient.. Family Communication:  The plan of care was discussed in details with the patient (and family). I answered all questions. The patient agreed to proceed with the above mentioned plan. Further management will depend upon hospital course. Disposition Plan: Back to previous home environment Consults called: Cardiology can be called in AM. All the records are reviewed and case discussed with ED provider.  Status is: Observation  I certify that  at the time of admission, it is my clinical judgment that the patient will require  hospital care extending less than 2 midnights.                            Dispo: The patient is from: Home              Anticipated d/c is to: Home              Patient currently is not medically stable to d/c.              Difficult to place patient: No  Hannah Beat M.D on 04/09/2023 at 10:58 PM  [facility]   From 7 PM-7 AM, contact night-coverage [email or web address]  CC: Primary care physician; Raliegh Ip, DO

## 2023-04-09 NOTE — Telephone Encounter (Signed)
  Chief Complaint: fainting Symptoms: walking and passed out Frequency: once  Disposition: [x] ED /[] Urgent Care (no appt availability in office) / [] Appointment(In office/virtual)/ []  [facility]/ [] Home Care/ [] Refused Recommended Disposition /[] [facility]/ []  Follow-up with PCP Additional Notes: Nurse Kathie Rhodes with [facility] called in regards to pt fainting while walking about  15-20 mins ago.  Lucila Maine was walking pt when she passed out and lowered her to ground. NO part of pt body hit the groud. Pt was unconscious for 1-2 mins. Pt is alert and oriented x3. Nurse Bettty says pt is pale. Pt ate breakfast. EMS was with pt during this call. Pt was discharged from hospital yesterday with syncope. Pt has follow up with cardiologist on 3/13. Per protocol, pt advised pt to go to ED. Nurse Jose Persia with EMS and agreed. Nurse Kathie Rhodes stated, "it looks like she is going to go with them."           Copied from CRM [id number]. Topic: Clinical - Red Word Triage >> Apr 09, 2023 11:47 AM Fonda Kinder J wrote: Red Word that prompted transfer to Nurse Triage: Fainted Reason for Disposition  [1] Age > 50 years  AND [2] now alert and feels fine  Answer Assessment - Initial Assessment Questions 1. ONSET: "How long were you unconscious?" (minutes) "When did it happen?"     1-2 minutes  2. CONTENT: "What happened during period of unconsciousness?" (e.g., seizure activity)      Not sure  3. MENTAL STATUS: "Alert and oriented now?" (oriented x 3 = name, month, location)      A/Ox 3 4. TRIGGER: "What do you think caused the fainting?" "What were you doing just before you fainted?"  (e.g., exercise, sudden standing up, prolonged standing)     Syncope  5. RECURRENT SYMPTOM: "Have you ever passed out before?" If Yes, ask: "When was the last time?" and "What happened that time?"      yes 6. INJURY: "Did you sustain any injury during the fall?"      na 7. CARDIAC SYMPTOMS: "Have you had any of  the following symptoms: chest pain, difficulty breathing, palpitations?"     Denies  8. NEUROLOGIC SYMPTOMS: "Have you had any of the following symptoms: headache, numbness, vertigo, weakness?"     Weakness 9. GI SYMPTOMS: "Have you had any of the following symptoms: abdomen pain, vomiting, diarrhea, blood in stools?"     Denies  10. OTHER SYMPTOMS: "Do you have any other symptoms?"       Denies  Protocols used: Fainting-A-AH

## 2023-04-09 NOTE — ED Notes (Signed)
Pt rang out for 2nd Time that IV was hurting.  RN aware but in another room This NT went in and noticed swelling above iv insertion. Fluids turned off, and RN notified of Infiltration RN Now at bedside

## 2023-04-10 ENCOUNTER — Observation Stay (HOSPITAL_COMMUNITY): Payer: Medicare HMO

## 2023-04-10 ENCOUNTER — Telehealth: Payer: Self-pay | Admitting: *Deleted

## 2023-04-10 ENCOUNTER — Observation Stay: Payer: Medicare HMO

## 2023-04-10 DIAGNOSIS — F419 Anxiety disorder, unspecified: Secondary | ICD-10-CM | POA: Insufficient documentation

## 2023-04-10 DIAGNOSIS — K219 Gastro-esophageal reflux disease without esophagitis: Secondary | ICD-10-CM | POA: Diagnosis not present

## 2023-04-10 DIAGNOSIS — I2510 Atherosclerotic heart disease of native coronary artery without angina pectoris: Secondary | ICD-10-CM | POA: Diagnosis not present

## 2023-04-10 DIAGNOSIS — R55 Syncope and collapse: Secondary | ICD-10-CM | POA: Diagnosis not present

## 2023-04-10 DIAGNOSIS — Z96642 Presence of left artificial hip joint: Secondary | ICD-10-CM | POA: Diagnosis not present

## 2023-04-10 DIAGNOSIS — I6523 Occlusion and stenosis of bilateral carotid arteries: Secondary | ICD-10-CM | POA: Diagnosis not present

## 2023-04-10 DIAGNOSIS — I5181 Takotsubo syndrome: Secondary | ICD-10-CM

## 2023-04-10 DIAGNOSIS — I10 Essential (primary) hypertension: Secondary | ICD-10-CM | POA: Insufficient documentation

## 2023-04-10 DIAGNOSIS — Z79899 Other long term (current) drug therapy: Secondary | ICD-10-CM | POA: Diagnosis not present

## 2023-04-10 DIAGNOSIS — F32A Depression, unspecified: Secondary | ICD-10-CM | POA: Diagnosis not present

## 2023-04-10 DIAGNOSIS — I5022 Chronic systolic (congestive) heart failure: Secondary | ICD-10-CM | POA: Diagnosis not present

## 2023-04-10 DIAGNOSIS — E785 Hyperlipidemia, unspecified: Secondary | ICD-10-CM | POA: Diagnosis not present

## 2023-04-10 LAB — BASIC METABOLIC PANEL
Anion gap: 6 (ref 5–15)
BUN: 18 mg/dL (ref 8–23)
CO2: 27 mmol/L (ref 22–32)
Calcium: 9.1 mg/dL (ref 8.9–10.3)
Chloride: 101 mmol/L (ref 98–111)
Creatinine, Ser: 0.88 mg/dL (ref 0.44–1.00)
GFR, Estimated: >60 mL/min (ref >60)
Glucose, Bld: 101 mg/dL — ABNORMAL HIGH (ref 70–99)
Potassium: 4.2 mmol/L (ref 3.5–5.1)
Sodium: 134 mmol/L — ABNORMAL LOW (ref 135–145)

## 2023-04-10 LAB — CBC
HCT: 33.5 % — ABNORMAL LOW (ref 36.0–46.0)
Hemoglobin: 11.3 g/dL — ABNORMAL LOW (ref 12.0–15.0)
MCH: 31.4 pg (ref 26.0–34.0)
MCHC: 33.7 g/dL (ref 30.0–36.0)
MCV: 93.1 fL (ref 80.0–100.0)
Platelets: 233 10*3/uL (ref 150–400)
RBC: 3.6 MIL/uL — ABNORMAL LOW (ref 3.87–5.11)
RDW: 12.6 % (ref 11.5–15.5)
WBC: 6.6 10*3/uL (ref 4.0–10.5)
nRBC: 0 % (ref 0.0–0.2)

## 2023-04-10 MED ORDER — METOPROLOL SUCCINATE ER 25 MG PO TB24: 25.0000 mg | ORAL_TABLET | Freq: Every day | ORAL | 3 refills | Status: DC

## 2023-04-10 NOTE — Consult Note (Signed)
Cardiology Consultation   Patient ID: Kaitlyn Serrano MRN: [id number]; DOB: [birth year]  Admit date: 04/09/2023 Date of Consult: 04/10/2023  PCP:  Raliegh Ip, DO   [facility] Providers Cardiologist:  Dina Rich, MD        Patient Profile:   Kaitlyn Serrano is a 83 y.o. female with a hx of chest pain (prior low-risk NST in 2012 and 2016), palpitations, HTN, HLD, Type II DM, GERD, anxiety/depression, chronic pain syndrome, dementia and recent Takotsubo cardiomyopathy who is being seen 04/10/2023 for the evaluation of syncope at the request of Dr. Kirke Corin  History of Present Illness:   Kaitlyn Serrano was recently admitted to [facility] from 2/4 - 04/05/2023 for evaluation of chest pain and EKG showed new TWI along leads I and aVL and Hs Troponin values were elevated at 746 and 418. Echocardiogram showed a reduced EF of 40 to 45% with global hypokinesis. Was also noted to have mild LVH, normal RV function, mild to moderate MR and mild to moderate AI. Cardiac catheterization on 04/03/2023 showed mild, nonobstructive disease with 20% stenosis along the mid LAD and otherwise no significant CAD. Was felt that she possibly had Takotsubo cardiomyopathy.  She did require additional diuresis and in regards to GDMT, she was ultimately started on Coreg 6.25 mg twice daily, Losartan 100 mg daily, Aldactone 12.5 mg daily and Zetia 10 mg daily.  She presented back to [facility] ED on 04/09/2023 for evaluation of a syncopal episode. In talking the patient and her daughter today, she reports she was initially doing well following her hospitalization and felt in her normal state of health until her event yesterday. Reports she had been sitting at the table and all of a sudden started to felt dizzy and rested her head on the table. Family members were going to help her back to the bed but she had a syncopal episode. Reports associated dizziness but no chest pain or palpitations. They are unsure  of how long she lost consciousness for but estimate a few minutes. No associated loss of bowel or bladder control. This morning, she reports feeling back to baseline. Reports her breathing has been stable and no recent orthopnea, PND or pitting edema. She questions if the episode was possibly due to her multiple medication changes during admission as she reports not tolerating certain medications in the past.  Says that she has overall had a good appetite since returning home. The patient herself says she does not drink a lot of water but her daughter says she drinks at least 3 bottles a day along with occasional soda and coffee.   Initial labs showed WBC 5.9, Hgb 13.0, platelets 255, Na+ 135, K+ 4.7 and creatinine 1.18 (previously 0.79 on 04/05/2023). BNP at 110. Initial and repeat Hs troponin values at 26 and 27. CXR showed mild left basilar atelectasis. CTA showed no evidence of a PE, aortic aneurysm or aortic dissection. EKG showed normal sinus rhythm, heart rate 66 with baseline artifact. Orthostatics were checked and negative as BP was at 167/67 with lying, 167/75 with sitting and 174/77 with standing. She was started on IV fluids at the time of admission and follow-up labs today show her kidney function has improved with creatinine at 0.88.  Past Medical History:  Diagnosis Date   Anxiety    Benzodiazepine dependence (HCC) 12/30/2017   Chronic pain syndrome    Complication of anesthesia    Depression    Diverticulosis    Duplex kidney  01/12/2008   normal. family unaware   Dysphagia    Dyspnea    Generalized weakness    GERD (gastroesophageal reflux disease)    GERD (gastroesophageal reflux disease)    High cholesterol    Hoarseness    Hx of cardiovascular stress test    negative 2012 and 2016   Hypertension    Insomnia    Intervertebral disc disorder with myelopathy, lumbar region    Lumbago    Lumbar spine pain    PONV (postoperative nausea and vomiting)    confusion after  anesthesia   Renal insufficiency    Ringing in ears    resolved   Spondylosis with myelopathy, lumbar region    legs, back    Past Surgical History:  Procedure Laterality Date   ABDOMINAL HYSTERECTOMY     BACK SURGERY     BONE MARROW ASPIRATION     CERVICAL SPINE SURGERY     EYE SURGERY Bilateral    Lasic   FIXATION KYPHOPLASTY     LAMINOTOMY     LEFT HEART CATH AND CORONARY ANGIOGRAPHY N/A 04/03/2023   Procedure: LEFT HEART CATH AND CORONARY ANGIOGRAPHY;  Surgeon: Yvonne Kendall, MD;  Location: [facility] INVASIVE CV LAB;  Service: Cardiovascular;  Laterality: N/A;   SPINAL FUSION     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   Procedure: TOTAL HIP ARTHROPLASTY ANTERIOR APPROACH;  Surgeon: Durene Romans, MD;  Location: [facility] ORS;  Service: Orthopedics;  Laterality: Right;  70 mins   TOTAL HIP ARTHROPLASTY Left 05/14/2022   Procedure: TOTAL HIP ARTHROPLASTY ANTERIOR APPROACH;  Surgeon: Durene Romans, MD;  Location: [facility] ORS;  Service: Orthopedics;  Laterality: Left;     Home Medications:  Prior to Admission medications   Medication Sig Start Date End Date Taking? Authorizing Provider  aspirin EC 81 MG tablet Take 81 mg by mouth daily. Swallow whole.   Yes [provider]  busPIRone (BUSPAR) 10 MG tablet Take 2 tablets (20 mg total) by mouth 2 (two) times daily. Patient taking differently: Take 10 mg by mouth 3 (three) times daily. 06/24/22  Yes Gottschalk, Kathie Rhodes M, DO  carvedilol (COREG) 6.25 MG tablet Take 1 tablet (6.25 mg total) by mouth 2 (two) times daily. 04/05/23 04/04/24 Yes Sheikh, Omair Latif, DO  celecoxib (CELEBREX) 200 MG capsule Take 200 mg by mouth 2 (two) times daily.   Yes [provider]  donepezil (ARICEPT) 10 MG tablet Take 1 tablet (10 mg total) by mouth at bedtime. For dementia 02/07/23  Yes Delynn Flavin M, DO  ezetimibe (ZETIA) 10 MG tablet TAKE ONE TABLET BY MOUTH DAILY AT 9AM Patient taking differently: Take 10 mg by mouth daily. 02/24/23  Yes Branch,  Dorothe Pea, MD  HYDROcodone-acetaminophen (NORCO) 10-325 MG tablet Take 1 tablet by mouth every 4 (four) hours as needed for severe pain (pain score 7-10).   Yes [provider]  losartan (COZAAR) 100 MG tablet Take 1 tablet (100 mg total) by mouth daily. 04/06/23  Yes Sheikh, Omair Latif, DO  mirtazapine (REMERON) 15 MG tablet Take 1 tablet (15 mg total) by mouth at bedtime. For anxiety/ appetite/ sleep 06/24/22  Yes Delynn Flavin M, DO  pantoprazole (PROTONIX) 40 MG tablet Take 1 tablet (40 mg total) by mouth 2 (two) times daily. 02/05/23  Yes Gottschalk, Kathie Rhodes M, DO  polyethylene glycol (MIRALAX / GLYCOLAX) 17 g packet Take 17 g by mouth 2 (two) times daily. Patient taking differently: Take 17 g by mouth daily as needed for mild  constipation. 05/15/22  Yes Cassandria Anger, PA-C  spironolactone (ALDACTONE) 25 MG tablet Take 0.5 tablets (12.5 mg total) by mouth daily. 04/06/23  Yes Sheikh, Omair Latif, DO  traZODone (DESYREL) 50 MG tablet Take 50 mg by mouth at bedtime.   Yes [provider]    Inpatient Medications: Scheduled Meds:  aspirin EC  81 mg Oral Daily   busPIRone  10 mg Oral TID   carvedilol  6.25 mg Oral BID   celecoxib  200 mg Oral BID   donepezil  10 mg Oral QHS   enoxaparin (LOVENOX) injection  30 mg Subcutaneous Q24H   ezetimibe  10 mg Oral Daily   lidocaine  1 patch Transdermal Q24H   losartan  100 mg Oral Daily   mirtazapine  15 mg Oral QHS   pantoprazole  40 mg Oral BID   spironolactone  12.5 mg Oral Daily   traZODone  50 mg Oral QHS   Continuous Infusions:  sodium chloride 100 mL/hr at 04/09/23 2315   PRN Meds: acetaminophen **OR** acetaminophen, HYDROcodone-acetaminophen, magnesium hydroxide, methocarbamol, polyethylene glycol, traZODone  Allergies:    Allergies  Allergen Reactions   Bee Venom Anaphylaxis   Penicillins Anaphylaxis and Rash   Amlodipine Swelling   Elavil [Amitriptyline Hcl] Other (See Comments)    Confusion,  hallucinations   Statins Swelling and Other (See Comments)    Peeling of skin    Social History:   Social History   Socioeconomic History   Marital status: Divorced    Spouse name: Not on file   Number of children: 2   Years of education: Not on file   Highest education level: Not on file  Occupational History   Not on file  Tobacco Use   Smoking status: Former    Current packs/day: 0.00    Average packs/day: 4.0 packs/day for 3.7 years (14.8 ttl pk-yrs)    Types: Cigarettes    Start date: 06/10/1960    Quit date: 02/20/1964    Years since quitting: 59.1   Smokeless tobacco: Never  Vaping Use   Vaping status: Never Used  Substance and Sexual Activity   Alcohol use: No    Alcohol/week: 0.0 standard drinks of alcohol   Drug use: No   Sexual activity: Not Currently    Birth control/protection: None  Other Topics Concern   Not on file  Social History Narrative   ** Merged History Encounter **       ** Merged History Encounter **  Ms Idler lives alone. She is separated/divorced from her second husband and widowed by her first husband. She has twin daughters, Aggie Cosier and Jasmine December. Aggie Cosier has been a cause of stress for her and she believes was preven   ting her from getting medical care that she needed. She is no longer talking to [place]. She states that Jasmine December is involved in her care and that she trusts her to have her best interest in mind.      Social Drivers of Corporate investment banker Strain: Low Risk  (07/11/2022)   Overall Financial Resource Strain (CARDIA)    Difficulty of Paying Living Expenses: Not hard at all  Food Insecurity: No Food Insecurity (04/09/2023)   Hunger Vital Sign    Worried About Running Out of Food in the Last Year: Never true    Ran Out of Food in the Last Year: Never true  Transportation Needs: No Transportation Needs (04/09/2023)   PRAPARE - Transportation    Lack of  Transportation (Medical): No    Lack of Transportation (Non-Medical): No   Physical Activity: Insufficiently Active (07/11/2022)   Exercise Vital Sign    Days of Exercise per Week: 3 days    Minutes of Exercise per Session: 30 min  Stress: No Stress Concern Present (07/11/2022)   [name] - Occupational Stress Questionnaire    Feeling of Stress : Not at all  Social Connections: Patient Declined (04/10/2023)   Social Connection and Isolation Panel [NHANES]    Frequency of Communication with Friends and Family: Patient declined    Frequency of Social Gatherings with Friends and Family: Patient declined    Attends Religious Services: Patient declined    Database administrator or Organizations: Patient declined    Attends Banker Meetings: Patient declined    Marital Status: Patient declined  Recent Concern: Social Connections - Socially Isolated (04/03/2023)   Social Connection and Isolation Panel [NHANES]    Frequency of Communication with Friends and Family: More than three times a week    Frequency of Social Gatherings with Friends and Family: More than three times a week    Attends Religious Services: Never    Database administrator or Organizations: No    Attends Banker Meetings: Never    Marital Status: Widowed  Intimate Partner Violence: Not At Risk (04/09/2023)   Humiliation, Afraid, Rape, and Kick questionnaire    Fear of Current or Ex-Partner: No    Emotionally Abused: No    Physically Abused: No    Sexually Abused: No    Family History:    Family History  Problem Relation Age of Onset   Diabetes Mother    Heart disease Mother    Hypertension Mother    Gallbladder disease Mother    Heart disease Father    Pneumonia Sister    Breast cancer Paternal Grandmother    Hypertension Brother    Hyperlipidemia Brother    Hypertension Child    Gallbladder disease Other      ROS:  Please see the history of present illness.   All other ROS reviewed and negative.     Physical Exam/Data:    Vitals:   04/10/23 0753 04/10/23 0754 04/10/23 0755 04/10/23 0829  BP:    (!) 162/79  Pulse: 60 60 62 62  Resp: 13 13 13 15   Temp:    98.2 F (36.8 C)  TempSrc:      SpO2: 91% 95% 93% 95%  Weight:    52.3 kg  Height:    5\' 2"  (1.575 m)    Intake/Output Summary (Last 24 hours) at 04/10/2023 0958 Last data filed at 04/10/2023 0900 Gross per 24 hour  Intake 300 ml  Output 300 ml  Net 0 ml      04/10/2023    8:29 AM 04/09/2023    1:05 PM 04/03/2023   12:59 PM  Last 3 Weights  Weight (lbs) 115 lb 3.2 oz 130 lb 116 lb 3.2 oz  Weight (kg) 52.254 kg 58.968 kg 52.708 kg     Body mass index is 21.07 kg/m.  General: Pleasant, elderly female appearing in no acute distress. HEENT: normal Neck: no JVD Vascular: No carotid bruits; Distal pulses 2+ bilaterally Cardiac:  normal S1, S2; RRR; no murmur  Lungs:  clear to auscultation bilaterally, no wheezing, rhonchi or rales  Abd: soft, nontender, no hepatomegaly  Ext: no pitting edema Musculoskeletal:  No deformities, BUE and BLE strength normal and  equal Skin: warm and dry  Neuro:  CNs 2-12 intact, no focal abnormalities noted Psych:  Normal affect   EKG:  The EKG was personally reviewed and demonstrates: NSR, HR 66 with baseline artifact. Telemetry:  Telemetry was personally reviewed and demonstrates: NSR, HR in 60's to 70's.   Relevant CV Studies:  Echocardiogram: 03/2023 IMPRESSIONS     1. Left ventricular ejection fraction, by estimation, is 40 to 45%. The  left ventricle has mildly decreased function. The left ventricle  demonstrates global hypokinesis. There is mild concentric left ventricular  hypertrophy. Left ventricular diastolic  function could not be evaluated.   2. Right ventricular systolic function is normal. The right ventricular  size is normal. Tricuspid regurgitation signal is inadequate for assessing  PA pressure.   3. The mitral valve is normal in structure. Mild to moderate mitral valve  regurgitation.  No evidence of mitral stenosis. Moderate mitral annular  calcification.   4. The aortic valve is normal in structure. Aortic valve regurgitation is  mild to moderate. No aortic stenosis is present.   5. The inferior vena cava is normal in size with greater than 50%  respiratory variability, suggesting right atrial pressure of 3 mmHg.    Cardiac Catheterization: 03/2023 Conclusions: Mild, non-obstructive coronary artery disease with 20% stenosis in the mid LAD.  Otherwise, no angiographically significant coronary artery disease identified. Moderately reduced left ventricular systolic function with mid anterior akinesis (LVEF 35-45%).  Question if this represents a Takotsubo variant. Moderately elevated left ventricular filling pressure (LVEDP 25 mmHg). Small bilateral radial arteries that would not allow passage of micropuncture wire above the mid forearms.  Recommend femoral access for future catheterizations.   Recommendations: Medical therapy and risk factor modification to prevent progression of mild coronary artery disease. Gentle diuresis and escalation of goal-directed medical therapy for suspected Takotsubo cardiomyopathy.  Laboratory Data:  High Sensitivity Troponin:   Recent Labs  Lab 04/01/23 1654 04/01/23 1906 04/09/23 1325 04/09/23 1550  TROPONINIHS 746* 418* 26* 27*     Chemistry Recent Labs  Lab 04/04/23 0340 04/05/23 0319 04/09/23 1325 04/10/23 0301  NA 139 137 135 134*  K 4.1 4.3 4.7 4.2  CL 104 100 97* 101  CO2 24 24 28 27   GLUCOSE 97 107* 145* 101*  BUN 6* 13 20 18   CREATININE 0.60 0.79 1.18* 0.88  CALCIUM 9.0 9.4 10.1 9.1  MG 1.8 2.3  --   --   GFRNONAA >60 >60 46* >60  ANIONGAP 11 13 10 6     Recent Labs  Lab 04/04/23 0340 04/05/23 0319  PROT 6.0* 5.8*  ALBUMIN 3.0* 2.9*  AST 23 23  ALT 12 12  ALKPHOS 52 50  BILITOT 0.9 0.7   Lipids No results for input(s): "CHOL", "TRIG", "HDL", "LABVLDL", "LDLCALC", "CHOLHDL" in the last 168 hours.   Hematology Recent Labs  Lab 04/05/23 0319 04/09/23 1325 04/10/23 0301  WBC 8.4 5.9 6.6  RBC 4.05 4.08 3.60*  HGB 12.4 13.0 11.3*  HCT 37.2 38.8 33.5*  MCV 91.9 95.1 93.1  MCH 30.6 31.9 31.4  MCHC 33.3 33.5 33.7  RDW 13.4 12.7 12.6  PLT 200 255 233   Thyroid No results for input(s): "TSH", "FREET4" in the last 168 hours.  BNP Recent Labs  Lab 04/09/23 1325  BNP 110.0*    DDimer No results for input(s): "DDIMER" in the last 168 hours.   Radiology/Studies:  CT T-SPINE NO CHARGE Result Date: 04/09/2023 CLINICAL DATA:  Syncope, back pain EXAM: CT  THORACIC SPINE WITHOUT CONTRAST TECHNIQUE: Multidetector CT images of the thoracic were obtained using the standard protocol without intravenous contrast. RADIATION DOSE REDUCTION: This exam was performed according to the departmental dose-optimization program which includes automated exposure control, adjustment of the mA and/or kV according to patient size and/or use of iterative reconstruction technique. COMPARISON:  CTA chest, abdomen and pelvis today FINDINGS: Alignment: Normal Vertebrae: No acute fracture or focal pathologic process. Paraspinal and other soft tissues: Negative Disc levels: Posterior fusion changes seen in the lower thoracic spine extending into the lumbar spine, described on lumbar spine CT today. 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 IMPRESSION: No acute bony abnormality. Postoperative and degenerative changes. Electronically Signed   By: Charlett Nose M.D.   On: 04/09/2023 18:40   CT L-SPINE NO CHARGE Result Date: 04/09/2023 CLINICAL DATA:  Syncope.  Back pain. EXAM: CT LUMBAR SPINE WITHOUT CONTRAST TECHNIQUE: Multidetector CT imaging of the lumbar spine was performed without intravenous contrast administration. Multiplanar CT image reconstructions were also generated. RADIATION DOSE REDUCTION: This exam was performed according to the departmental  dose-optimization program which includes automated exposure control, adjustment of the mA and/or kV according to patient size and/or use of iterative reconstruction technique. COMPARISON:  CT chest, abdomen and pelvis today FINDINGS: Segmentation: 5 lumbar type vertebrae. Alignment: Normal Vertebrae: Osteopenia. No acute bony abnormality or suspicious focal osseous lesion. Paraspinal and other soft tissues: Negative Disc levels: Prior posterior fusion from T10-S1. No visible complicating feature. IMPRESSION: Posterior fusion changes from T10-S1. No acute bony abnormality. Electronically Signed   By: Charlett Nose M.D.   On: 04/09/2023 18:39   CT Angio Chest/Abd/Pel for Dissection W and/or Wo Contrast Result Date: 04/09/2023 CLINICAL DATA:  Syncope.  Back pain. EXAM: CT ANGIOGRAPHY CHEST, ABDOMEN AND PELVIS TECHNIQUE: Non-contrast CT of the chest was initially obtained. Multidetector CT imaging through the chest, abdomen and pelvis was performed using the standard protocol during bolus administration of intravenous contrast. Multiplanar reconstructed images and MIPs were obtained and reviewed to evaluate the vascular anatomy. RADIATION DOSE REDUCTION: This exam was performed according to the departmental dose-optimization program which includes automated exposure control, adjustment of the mA and/or kV according to patient size and/or use of iterative reconstruction technique. CONTRAST:  OMNIPAQUE IOHEXOL 350 MG/ML SOLN COMPARISON:  04/01/2023 FINDINGS: CTA CHEST FINDINGS Cardiovascular: No filling defects in the pulmonary arteries to suggest pulmonary emboli. Heart is normal size. Aorta is normal caliber. Aortic atherosclerosis. No dissection. Mediastinum/Nodes: No mediastinal, hilar, or axillary adenopathy. Trachea and esophagus are unremarkable. Thyroid unremarkable. Lungs/Pleura: No acute confluent airspace opacity or effusion. Musculoskeletal: Chest wall soft tissues are unremarkable. Review of the MIP  images confirms the above findings. CTA ABDOMEN AND PELVIS FINDINGS VASCULAR Aorta: Normal caliber aorta without aneurysm, dissection, vasculitis or significant stenosis. Scattered moderate calcifications. Celiac: Patent without evidence of aneurysm, dissection, vasculitis or significant stenosis. SMA: Patent without evidence of aneurysm, dissection, vasculitis or significant stenosis. Renals: 2 left and 1 right renal artery, patent. No evidence of aneurysm, dissection, or vasculitis IMA: Patent without evidence of aneurysm, dissection, vasculitis or significant stenosis. Inflow: Patent without evidence of aneurysm, dissection, vasculitis or significant stenosis. Scattered atherosclerotic calcifications. Veins: No obvious venous abnormality within the limitations of this arterial phase study. Review of the MIP images confirms the above findings. NON-VASCULAR Hepatobiliary: No focal hepatic abnormality. Gallbladder unremarkable. Pancreas: No focal abnormality or ductal dilatation. Spleen: No focal abnormality.  Normal size. Adrenals/Urinary Tract: No  adrenal abnormality. No focal renal abnormality. No stones or hydronephrosis. Urinary bladder obscured by beam hardening artifact from bilateral hip replacements. Stomach/Bowel: Colonic diverticulosis, most pronounced in the left colon. No active diverticulitis. Stomach and small bowel decompressed. No obstruction or inflammatory process. Lymphatic: No adenopathy. Reproductive: Obscured by beam hardening artifact from bilateral hip replacements. Other: No free fluid or free air. Musculoskeletal: Bilateral hip replacements. Posterior fusion changes from T10-S1. No acute bony abnormality. Review of the MIP images confirms the above findings. IMPRESSION: No evidence of pulmonary embolus, aortic aneurysm or aortic dissection. Aortic atherosclerosis. No acute cardiopulmonary disease. No acute findings in the abdomen or pelvis. Colonic diverticulosis.  No active  diverticulitis. Electronically Signed   By: Charlett Nose M.D.   On: 04/09/2023 18:35   DG Chest Portable 1 View Result Date: 04/09/2023 CLINICAL DATA:  Syncope. EXAM: PORTABLE CHEST 1 VIEW COMPARISON:  Chest radiograph dated 04/01/2023. FINDINGS: Stable cardiomediastinal silhouette. Aortic atherosclerosis. Overlapping telemetry wires. No focal consolidation, sizeable pleural effusion, or pneumothorax. Remote left rib fractures. Cervical and thoracolumbar fusion hardware. No acute osseous abnormality. IMPRESSION: Mild left basilar atelectasis. Otherwise, no acute cardiopulmonary findings. Electronically Signed   By: Hart Robinsons M.D.   On: 04/09/2023 15:26     Assessment and Plan:   1. Syncope - Unclear etiology as orthostatics were negative on admission and cardiac telemetry has been unrevealing for an arrhythmia thus far. She did have mild elevation of her creatinine at 1.18 suggesting some dehydration when compared to prior values. She is receiving IV fluids and feels back to baseline this morning and renal function has quickly improved with creatinine at 0.88 today.  Carotid Dopplers are pending as well. - The patient and her family are very concerned that her medication changes led to her syncopal episode. For her cardiomyopathy, she has been taking Coreg 6.25 mg twice daily, Losartan 100 mg daily and Spironolactone 12.5 mg daily. Will stop Spironolactone for now. Would also be concerned about polypharmacy in regards to her other medications as she is listed as taking Hydrocodone every 4 hours PRN, Buspar and also takes Trazodone at night. Will defer possible adjustment of these to the admitting team.  2. Chronic HFmrEF/Takotsubo Cardiomyopathy - Recent echocardiogram showed her EF was mildly reduced at 40 to 45%. She appears euvolemic by examination today. Continue Coreg 6.25 mg twice daily and Losartan 100 mg daily. Could possibly transition Losartan to [facility] as an outpatient but would  hold off for now given her syncopal episode and concerns for medication intolerances. Would hold Spironolactone 12.5 mg daily for now. She has not been on SGLT2 inhibitor given her dementia and concern for UTI's.  3.  CAD - Recent cardiac catheterization earlier this month showed mild, nonobstructive disease with only 20% stenosis along the mid LAD. Hs troponin values have been flat at 26 and 27 this admission which is not consistent with ACS. - Remains on ASA 81 mg daily and she is also on Zetia 10 mg daily (previously intolerant to statin therapy).  4. HTN - BP has been variable, at 114/66  - 178/87 since admission, at 162/79 on most recent check. She did just receive Coreg and Losartan. Continue to follow BP after receiving morning medications.    For questions or updates, please contact [facility] Please consult [email or web address] for contact info under    Signed, Ellsworth Lennox, PA-C  04/10/2023 9:58 AM

## 2023-04-10 NOTE — Discharge Instructions (Signed)
Rent/Utilities/Housing  Agency: [employer] Address: [address] [address] [place], [place] [address]  [address] [place], [place] [address]  Phone Number: [phone] or [phone] For the hearing-impaired - Dial 711 for [employer]  Agency Name: Miguel Aschoff. Dept. of Health and Human Services Address: [address], [place], [place] [address] Phone: [phone] Website: www.co.[place]..us Services Offered: Temporary financial assistance, subsidized housing, and utility  assistance   Agency Name: [employer]  Address: [address], [place], [place] [address] Phone: [phone] ext. [phone] Email: Contact: info@newrha .org Website: FootballPromos.co.nz Services Offered: Subsidized apartment rent based on income.   Agency Name: [facility] Ministry Address: [facility], [address] [place], [place] Phone: [phone]  Website: www.ccmeden.org Services Offered: Museum/gallery curator, utility assistance Technical brewer for all of  [facility], [employer], [employer], [employer]  February 01, 2020 [address] for [place] area only), rent assistance.   Agency Name: [facility] Address: [address] [place], [place], [place] [address] / [address]  [place] Phone: [phone] [place] / [phone] Rafael Gonzalez Website: OpinionTrades.tn NetworkAffair.co.za Services Offered: Civil Service fast streamer, food, showers, hygiene products utility payment  assistance, thrift shops, rental assistance, Support Groups

## 2023-04-10 NOTE — Progress Notes (Signed)
Was called into pt's room by grandson with c/o left arm pain. Pt states, "the pain feels like it's down to the bone, but no chest pain". EKG performed and placed in pt's chart. MD made aware during pt rounding.

## 2023-04-10 NOTE — Telephone Encounter (Signed)
Order placed for Zio monitor.

## 2023-04-10 NOTE — Telephone Encounter (Signed)
-----   Message from [facility], Gwenette Greet sent at 04/10/2023  3:58 PM EST ----- Regarding: Event monitor Hi Jake Seats,   Cardiology saw this patient during this admission and are recommending an event monitor. I would appreciate it if you could arrange this.   Thanks,  Dr Caffie Pinto

## 2023-04-10 NOTE — Assessment & Plan Note (Signed)
-   We will continue antihypertensive therapy.

## 2023-04-10 NOTE — TOC CM/SW Note (Signed)
Transition of Care [facility]) - Inpatient Brief Assessment   Patient Details  Name: Kaitlyn Serrano MRN: [id number] Date of Birth: 1940-07-06  Transition of Care [facility]) CM/SW Contact:    Isabella Bowens, LCSWA Phone Number: 04/10/2023, 1:43 PM   Clinical Narrative: Resources added to AVS for utilities since SDOH flagged.   Transition of Care Department [facility]) has reviewed patient and no TOC needs have been identified at this time. We will continue to monitor patient advancement through interdisciplinary progression rounds. If new patient transition needs arise, please place a TOC consult.    Transition of Care Asessment: Insurance and Status: Insurance coverage has been reviewed Patient has primary care physician: Yes Home environment has been reviewed: Single Family Home Prior level of function:: Independent Prior/Current Home Services: No current home services Social Drivers of Health Review: SDOH reviewed interventions complete (Added resources for Utilities) Readmission risk has been reviewed: Yes Transition of care needs: no transition of care needs at this time

## 2023-04-10 NOTE — Assessment & Plan Note (Addendum)
-   We will continue BuSpar, Remeron and trazodone.

## 2023-04-10 NOTE — Care Management Obs Status (Signed)
MEDICARE OBSERVATION STATUS NOTIFICATION   Patient Details  Name: Kaitlyn Serrano MRN: [id number] Date of Birth: 08-Sep-1940   Medicare Observation Status Notification Given:  Yes    Corey Harold 04/10/2023, 3:43 PM

## 2023-04-10 NOTE — Assessment & Plan Note (Addendum)
-   The patient will be admitted to an observation medically monitored bed. - Will check orthostatics q 12 hours. - Will obtain a bilateral carotid Doppler.  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  There was mild to moderate aortic regurgitation and mild to moderate mitral valve regurgitation. - The patient will be gently hydrated with IV normal saline and monitored for arrhythmias. -Differential diagnoses would include neurally mediated syncope, cardiogenic, arrhythmias related,  orthostatic hypotension and less likely hypoglycemia.

## 2023-04-10 NOTE — Assessment & Plan Note (Signed)
-  We will continue aspirin and beta-blocker therapy as well as statin therapy.

## 2023-04-10 NOTE — Assessment & Plan Note (Signed)
-  We will continue PPI therapy

## 2023-04-10 NOTE — Assessment & Plan Note (Addendum)
Will continue statin therapy and Zetia.

## 2023-04-10 NOTE — Discharge Summary (Signed)
Physician Discharge Summary   Patient: Kaitlyn Serrano MRN: [id number] DOB: 1940/06/15  Admit date:     04/09/2023  Discharge date: 04/10/23  Discharge Physician: MDALA-GAUSI, Gwenette Greet   PCP: Raliegh Ip, DO   Recommendations at discharge:   Medication changes as noted below. Outpatient follow-up with cardiology  Discharge Diagnoses: Principal Problem:   Syncope Active Problems:   Anxiety and depression   GERD without esophagitis   Dyslipidemia   Essential hypertension   Coronary artery disease  Resolved Problems:   * No resolved hospital problems. *  Hospital Course: 83 year old woman with PMH of CAD, HTN, HLD, depression, GERD who presented to the ED with syncope.  She also complained of left upper extremity discomfort prior to the episode of syncope.  Of note, the patient had been seen and evaluated for syncope about a week prior.  On that occasion, pulmonary embolism was ruled out.  She was seen by cardiology and underwent a cardiac catheterization which showed mild nonobstructive CAD.  There was question of Takotsubo cardiomyopathy.  She was admitted and kept on telemetry.  Troponin was not elevated.  EKG remains similar to prior.  She did not appear to be orthostatic but did have a mild AKI on presentation, which resolved with IV fluids.   CT angiography C/A/P was without evidence of aneurysm, dissection, vasculitis or significant stenosis.  CT of the lumbar spine and thoracic spine showed no acute bony abnormalities. The patient also underwent a carotid ultrasound during this visit and this showed mild amount of calcified plaque at the level of both carotid bifurcations, with an estimated bilateral ICA stenosis of less than 50%.  Cardiology was consulted.  They recommended increasing fluid intake at home, discontinuation of carvedilol and starting metoprolol succinate, continuing losartan, holding spironolactone.  A 2-week event monitor was also recommended and  the patient is to follow-up in 1 month.   Consultants: Cardiology Procedures performed: Carotid ultrasound Disposition: Home Diet recommendation:  Discharge Diet Orders (From admission, onward)     Start     Ordered   04/10/23 0000  Diet - low sodium heart healthy        04/10/23 1603           Regular diet DISCHARGE MEDICATION: Allergies as of 04/10/2023       Reactions   Bee Venom Anaphylaxis   Penicillins Anaphylaxis, Rash   Amlodipine Swelling   Elavil [amitriptyline Hcl] Other (See Comments)   Confusion, hallucinations   Statins Swelling, Other (See Comments)   Peeling of skin        Medication List     STOP taking these medications    carvedilol 6.25 MG tablet Commonly known as: Coreg   spironolactone 25 MG tablet Commonly known as: ALDACTONE       TAKE these medications    aspirin EC 81 MG tablet Take 81 mg by mouth daily. Swallow whole.   busPIRone 10 MG tablet Commonly known as: BUSPAR Take 1 tablet (10 mg total) by mouth 3 (three) times daily.   celecoxib 200 MG capsule Commonly known as: CELEBREX Take 200 mg by mouth 2 (two) times daily.   donepezil 10 MG tablet Commonly known as: ARICEPT Take 1 tablet (10 mg total) by mouth at bedtime. For dementia   ezetimibe 10 MG tablet Commonly known as: ZETIA TAKE ONE TABLET BY MOUTH DAILY AT 9AM What changed: See the new instructions.   HYDROcodone-acetaminophen 10-325 MG tablet Commonly known as: NORCO Take 1 tablet  by mouth every 4 (four) hours as needed for severe pain (pain score 7-10).   losartan 100 MG tablet Commonly known as: COZAAR Take 1 tablet (100 mg total) by mouth daily.   metoprolol succinate 25 MG 24 hr tablet Commonly known as: Toprol XL Take 1 tablet (25 mg total) by mouth daily.   mirtazapine 15 MG tablet Commonly known as: REMERON Take 1 tablet (15 mg total) by mouth at bedtime. For anxiety/ appetite/ sleep   pantoprazole 40 MG tablet Commonly known as:  PROTONIX Take 1 tablet (40 mg total) by mouth 2 (two) times daily.   polyethylene glycol 17 g packet Commonly known as: MIRALAX / GLYCOLAX Take 17 g by mouth 2 (two) times daily. What changed:  when to take this reasons to take this   traZODone 50 MG tablet Commonly known as: DESYREL Take 50 mg by mouth at bedtime.        Discharge Exam: Filed Weights   04/09/23 1305 04/10/23 0829  Weight: 59 kg 52.3 kg   Physical Exam on Day of Discharge   General: Alert, cheerful, oriented X3  Oral cavity: moist mucous membranes  Neck: supple  Chest: clear to auscultation. No crackles, no wheezes  CVS: S1,S2 RRR. No murmurs  Abd: No distention, soft, non-tender. No masses palpable  Extr: No edema    Condition at discharge: stable  The results of significant diagnostics from this hospitalization (including imaging, microbiology, ancillary and laboratory) are listed below for reference.   Imaging Studies: US Carotid Bilateral Result Date: 04/10/2023 CLINICAL DATA:  Syncope, hypertension and hyperlipidemia. EXAM: BILATERAL CAROTID DUPLEX ULTRASOUND TECHNIQUE: Wallace Cullens scale imaging, color Doppler and duplex ultrasound were performed of bilateral carotid and vertebral arteries in the neck. COMPARISON:  None Available. FINDINGS: Criteria: Quantification of carotid stenosis is based on velocity parameters that correlate the residual internal carotid diameter with NASCET-based stenosis levels, using the diameter of the distal internal carotid lumen as the denominator for stenosis measurement. The following velocity measurements were obtained: RIGHT ICA:  84/19 cm/sec CCA:  124/14 cm/sec SYSTOLIC ICA/CCA RATIO:  0.7 ECA:  83 cm/sec LEFT ICA:  67/18 cm/sec CCA:  129/14 cm/sec SYSTOLIC ICA/CCA RATIO:  0.5 ECA:  88 cm/sec RIGHT CAROTID ARTERY: There is a mild amount of calcified plaque at the level of the right ICA origin. Estimated right ICA stenosis is less than 50%. RIGHT VERTEBRAL ARTERY: Antegrade flow  with normal waveform and velocity. LEFT CAROTID ARTERY: Mild amount of calcified plaque at the level of the left carotid bulb and proximal left ICA. Estimated left ICA stenosis is less than 50%. LEFT VERTEBRAL ARTERY: Antegrade flow with normal waveform and velocity. IMPRESSION: Mild amount of calcified plaque at the level of both carotid bifurcations. Estimated bilateral ICA stenosis is less than 50%. Electronically Signed   By: Irish Lack M.D.   On: 04/10/2023 15:46   CT T-SPINE NO CHARGE Result Date: 04/09/2023 CLINICAL DATA:  Syncope, back pain EXAM: CT THORACIC SPINE WITHOUT CONTRAST TECHNIQUE: Multidetector CT images of the thoracic were obtained using the standard protocol without intravenous contrast. RADIATION DOSE REDUCTION: This exam was performed according to the departmental dose-optimization program which includes automated exposure control, adjustment of the mA and/or kV according to patient size and/or use of iterative reconstruction technique. COMPARISON:  CTA chest, abdomen and pelvis today FINDINGS: Alignment: Normal Vertebrae: No acute fracture or focal pathologic process. Paraspinal and other soft tissues: Negative Disc levels: Posterior fusion changes seen in the lower thoracic spine extending into  the lumbar spine, described on lumbar spine CT today. 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 IMPRESSION: No acute bony abnormality. Postoperative and degenerative changes. Electronically Signed   By: Charlett Nose M.D.   On: 04/09/2023 18:40   CT L-SPINE NO CHARGE Result Date: 04/09/2023 CLINICAL DATA:  Syncope.  Back pain. EXAM: CT LUMBAR SPINE WITHOUT CONTRAST TECHNIQUE: Multidetector CT imaging of the lumbar spine was performed without intravenous contrast administration. Multiplanar CT image reconstructions were also generated. RADIATION DOSE REDUCTION: This exam was performed according to the departmental  dose-optimization program which includes automated exposure control, adjustment of the mA and/or kV according to patient size and/or use of iterative reconstruction technique. COMPARISON:  CT chest, abdomen and pelvis today FINDINGS: Segmentation: 5 lumbar type vertebrae. Alignment: Normal Vertebrae: Osteopenia. No acute bony abnormality or suspicious focal osseous lesion. Paraspinal and other soft tissues: Negative Disc levels: Prior posterior fusion from T10-S1. No visible complicating feature. IMPRESSION: Posterior fusion changes from T10-S1. No acute bony abnormality. Electronically Signed   By: Charlett Nose M.D.   On: 04/09/2023 18:39   CT Angio Chest/Abd/Pel for Dissection W and/or Wo Contrast Result Date: 04/09/2023 CLINICAL DATA:  Syncope.  Back pain. EXAM: CT ANGIOGRAPHY CHEST, ABDOMEN AND PELVIS TECHNIQUE: Non-contrast CT of the chest was initially obtained. Multidetector CT imaging through the chest, abdomen and pelvis was performed using the standard protocol during bolus administration of intravenous contrast. Multiplanar reconstructed images and MIPs were obtained and reviewed to evaluate the vascular anatomy. RADIATION DOSE REDUCTION: This exam was performed according to the departmental dose-optimization program which includes automated exposure control, adjustment of the mA and/or kV according to patient size and/or use of iterative reconstruction technique. CONTRAST:  OMNIPAQUE IOHEXOL 350 MG/ML SOLN COMPARISON:  04/01/2023 FINDINGS: CTA CHEST FINDINGS Cardiovascular: No filling defects in the pulmonary arteries to suggest pulmonary emboli. Heart is normal size. Aorta is normal caliber. Aortic atherosclerosis. No dissection. Mediastinum/Nodes: No mediastinal, hilar, or axillary adenopathy. Trachea and esophagus are unremarkable. Thyroid unremarkable. Lungs/Pleura: No acute confluent airspace opacity or effusion. Musculoskeletal: Chest wall soft tissues are unremarkable. Review of the MIP  images confirms the above findings. CTA ABDOMEN AND PELVIS FINDINGS VASCULAR Aorta: Normal caliber aorta without aneurysm, dissection, vasculitis or significant stenosis. Scattered moderate calcifications. Celiac: Patent without evidence of aneurysm, dissection, vasculitis or significant stenosis. SMA: Patent without evidence of aneurysm, dissection, vasculitis or significant stenosis. Renals: 2 left and 1 right renal artery, patent. No evidence of aneurysm, dissection, or vasculitis IMA: Patent without evidence of aneurysm, dissection, vasculitis or significant stenosis. Inflow: Patent without evidence of aneurysm, dissection, vasculitis or significant stenosis. Scattered atherosclerotic calcifications. Veins: No obvious venous abnormality within the limitations of this arterial phase study. Review of the MIP images confirms the above findings. NON-VASCULAR Hepatobiliary: No focal hepatic abnormality. Gallbladder unremarkable. Pancreas: No focal abnormality or ductal dilatation. Spleen: No focal abnormality.  Normal size. Adrenals/Urinary Tract: No adrenal abnormality. No focal renal abnormality. No stones or hydronephrosis. Urinary bladder obscured by beam hardening artifact from bilateral hip replacements. Stomach/Bowel: Colonic diverticulosis, most pronounced in the left colon. No active diverticulitis. Stomach and small bowel decompressed. No obstruction or inflammatory process. Lymphatic: No adenopathy. Reproductive: Obscured by beam hardening artifact from bilateral hip replacements. Other: No free fluid or free air. Musculoskeletal: Bilateral hip replacements. Posterior fusion changes from T10-S1. No acute bony abnormality. Review of the MIP images confirms the above findings. IMPRESSION: No evidence of pulmonary embolus, aortic  aneurysm or aortic dissection. Aortic atherosclerosis. No acute cardiopulmonary disease. No acute findings in the abdomen or pelvis. Colonic diverticulosis.  No active  diverticulitis. Electronically Signed   By: Charlett Nose M.D.   On: 04/09/2023 18:35   DG Chest Portable 1 View Result Date: 04/09/2023 CLINICAL DATA:  Syncope. EXAM: PORTABLE CHEST 1 VIEW COMPARISON:  Chest radiograph dated 04/01/2023. FINDINGS: Stable cardiomediastinal silhouette. Aortic atherosclerosis. Overlapping telemetry wires. No focal consolidation, sizeable pleural effusion, or pneumothorax. Remote left rib fractures. Cervical and thoracolumbar fusion hardware. No acute osseous abnormality. IMPRESSION: Mild left basilar atelectasis. Otherwise, no acute cardiopulmonary findings. Electronically Signed   By: Hart Robinsons M.D.   On: 04/09/2023 15:26   CARDIAC CATHETERIZATION Result Date: 04/03/2023 Conclusions: Mild, non-obstructive coronary artery disease with 20% stenosis in the mid LAD.  Otherwise, no angiographically significant coronary artery disease identified. Moderately reduced left ventricular systolic function with mid anterior akinesis (LVEF 35-45%).  Question if this represents a Takotsubo variant. Moderately elevated left ventricular filling pressure (LVEDP 25 mmHg). Small bilateral radial arteries that would not allow passage of micropuncture wire above the mid forearms.  Recommend femoral access for future catheterizations. Recommendations: Medical therapy and risk factor modification to prevent progression of mild coronary artery disease. Gentle diuresis and escalation of goal-directed medical therapy for suspected Takotsubo cardiomyopathy. Yvonne Kendall, MD [facility]  ECHOCARDIOGRAM COMPLETE Result Date: 04/03/2023    ECHOCARDIOGRAM REPORT   Patient Name:   COLBY CATANESE Date of Exam: 04/03/2023 Medical Rec #:  [id number]       Height:       62.0 in Accession #:    [id number]      Weight:       125.0 lb Date of Birth:  07-17-1940       BSA:          1.566 m Patient Age:    82 years        BP:           167/72 mmHg Patient Gender: F               HR:           80 bpm. Exam  Location:  Inpatient Procedure: 2D Echo, Cardiac Doppler and Color Doppler Indications:    Abnormal ECG R94.31                 Elevated Troponin  History:        Patient has prior history of Echocardiogram examinations, most                 recent 09/08/2014. Abnormal ECG, Signs/Symptoms:Syncope; Risk                 Factors:Hypertension and Diabetes.  Sonographer:    Webb Laws Referring Phys: John Giovanni IMPRESSIONS  1. Left ventricular ejection fraction, by estimation, is 40 to 45%. The left ventricle has mildly decreased function. The left ventricle demonstrates global hypokinesis. There is mild concentric left ventricular hypertrophy. Left ventricular diastolic function could not be evaluated.  2. Right ventricular systolic function is normal. The right ventricular size is normal. Tricuspid regurgitation signal is inadequate for assessing PA pressure.  3. The mitral valve is normal in structure. Mild to moderate mitral valve regurgitation. No evidence of mitral stenosis. Moderate mitral annular calcification.  4. The aortic valve is normal in structure. Aortic valve regurgitation is mild to moderate. No aortic stenosis is present.  5. The inferior vena cava is normal in  size with greater than 50% respiratory variability, suggesting right atrial pressure of 3 mmHg. FINDINGS  Left Ventricle: Left ventricular ejection fraction, by estimation, is 40 to 45%. The left ventricle has mildly decreased function. The left ventricle demonstrates global hypokinesis. The left ventricular internal cavity size was normal in size. There is  mild concentric left ventricular hypertrophy. Left ventricular diastolic function could not be evaluated due to mitral annular calcification (moderate or greater). Left ventricular diastolic function could not be evaluated. Right Ventricle: The right ventricular size is normal. No increase in right ventricular wall thickness. Right ventricular systolic function is normal.  Tricuspid regurgitation signal is inadequate for assessing PA pressure. Left Atrium: Left atrial size was normal in size. Right Atrium: Right atrial size was normal in size. Pericardium: There is no evidence of pericardial effusion. Presence of epicardial fat layer. Mitral Valve: The mitral valve is normal in structure. Moderate mitral annular calcification. Mild to moderate mitral valve regurgitation. No evidence of mitral valve stenosis. Tricuspid Valve: The tricuspid valve is normal in structure. Tricuspid valve regurgitation is not demonstrated. No evidence of tricuspid stenosis. Aortic Valve: The aortic valve is normal in structure. Aortic valve regurgitation is mild to moderate. No aortic stenosis is present. Pulmonic Valve: The pulmonic valve was normal in structure. Pulmonic valve regurgitation is not visualized. No evidence of pulmonic stenosis. Aorta: The aortic root is normal in size and structure. Venous: The inferior vena cava is normal in size with greater than 50% respiratory variability, suggesting right atrial pressure of 3 mmHg. IAS/Shunts: No atrial level shunt detected by color flow Doppler.  LEFT VENTRICLE PLAX 2D LVIDd:         4.40 cm     Diastology LVIDs:         3.20 cm     LV e' medial:    5.00 cm/s LV PW:         1.20 cm     LV E/e' medial:  16.7 LV IVS:        1.30 cm     LV e' lateral:   6.42 cm/s LVOT diam:     1.90 cm     LV E/e' lateral: 13.0 LV SV:         55 LV SV Index:   35 LVOT Area:     2.84 cm  LV Volumes (MOD) LV vol d, MOD A2C: 85.7 ml LV vol d, MOD A4C: 88.6 ml LV vol s, MOD A2C: 43.3 ml LV vol s, MOD A4C: 56.8 ml LV SV MOD A2C:     42.4 ml LV SV MOD A4C:     88.6 ml LV SV MOD BP:      39.8 ml RIGHT VENTRICLE             IVC RV Basal diam:  2.20 cm     IVC diam: 1.60 cm RV S prime:     13.80 cm/s TAPSE (M-mode): 3.7 cm LEFT ATRIUM             Index        RIGHT ATRIUM          Index LA diam:        3.40 cm 2.17 cm/m   RA Area:     8.48 cm LA Vol (A2C):   21.7 ml 13.86  ml/m  RA Volume:   15.10 ml 9.65 ml/m LA Vol (A4C):   29.9 ml 19.10 ml/m LA Biplane Vol: 25.5 ml 16.29 ml/m  AORTIC  VALVE LVOT Vmax:   105.50 cm/s LVOT Vmean:  73.650 cm/s LVOT VTI:    0.194 m  AORTA Ao Root diam: 2.00 cm Ao Asc diam:  2.50 cm MITRAL VALVE MV Area (PHT): 4.40 cm     SHUNTS MV Decel Time: 173 msec     Systemic VTI:  0.19 m MV E velocity: 83.30 cm/s   Systemic Diam: 1.90 cm MV A velocity: 107.00 cm/s MV E/A ratio:  0.78 Kardie Tobb DO Electronically signed by Thomasene Ripple DO Signature Date/Time: 04/03/2023/11:55:33 AM    Final    CT Head Wo Contrast Result Date: 04/01/2023 CLINICAL DATA:  Syncope/presyncope, cerebrovascular cause suspected EXAM: CT HEAD WITHOUT CONTRAST TECHNIQUE: Contiguous axial images were obtained from the base of the skull through the vertex without intravenous contrast. RADIATION DOSE REDUCTION: This exam was performed according to the departmental dose-optimization program which includes automated exposure control, adjustment of the mA and/or kV according to patient size and/or use of iterative reconstruction technique. COMPARISON:  09/19/2022 FINDINGS: Brain: Age related volume loss. Chronic small-vessel ischemic changes the white matter. No sign of acute infarction, mass lesion, hemorrhage, hydrocephalus or extra-axial collection. Vascular: There is atherosclerotic calcification of the major vessels at the base of the brain. Skull: Negative Sinuses/Orbits: Clear/normal Other: None IMPRESSION: No acute CT finding. Age related volume loss. Chronic small-vessel ischemic changes of the white matter. Electronically Signed   By: Paulina Fusi M.D.   On: 04/01/2023 20:02   CT Angio Chest PE W and/or Wo Contrast Result Date: 04/01/2023 CLINICAL DATA:  Syncope/presyncope, cerebrovascular cause suspected. Chest pain. Possible pulmonary embolism. EXAM: CT ANGIOGRAPHY CHEST WITH CONTRAST TECHNIQUE: Multidetector CT imaging of the chest was performed using the standard protocol  during bolus administration of intravenous contrast. Multiplanar CT image reconstructions and MIPs were obtained to evaluate the vascular anatomy. RADIATION DOSE REDUCTION: This exam was performed according to the departmental dose-optimization program which includes automated exposure control, adjustment of the mA and/or kV according to patient size and/or use of iterative reconstruction technique. CONTRAST:  65mL OMNIPAQUE IOHEXOL 350 MG/ML SOLN COMPARISON:  12/09/2015 CT.  Chest radiography same day. FINDINGS: Cardiovascular: Heart size upper limits of normal. No right ventricular enlargement. No pericardial fluid. Some coronary artery calcification and aortic atherosclerotic calcification are present. Pulmonary arterial opacification is good. There are no pulmonary emboli. Mediastinum/Nodes: No mediastinal or hilar mass or lymphadenopathy. Lungs/Pleura: Mild dependent atelectasis. No pleural effusion. No acute pneumonia. No mass or nodule. Upper Abdomen: No acute or significant finding. Musculoskeletal: Previous thoracolumbar fusion. Some degenerative change of the disc levels above fusion. No evidence of regional acute fracture. Review of the MIP images confirms the above findings. IMPRESSION: 1. No pulmonary emboli. 2. Coronary artery calcification. Aortic atherosclerotic calcification. 3. Previous thoracolumbar fusion. Some degenerative change of the disc levels above fusion. Electronically Signed   By: Paulina Fusi M.D.   On: 04/01/2023 20:00   DG Chest Port 1 View Result Date: 04/01/2023 CLINICAL DATA:  Chest pain EXAM: PORTABLE CHEST 1 VIEW COMPARISON:  02/17/2020 FINDINGS: Stable cardiomediastinal silhouette. Aortic atherosclerotic calcification. Chronic bronchitic changes are similar to prior. No focal pneumonia, pleural effusion, or pneumothorax. Remote left rib fractures. Cervical and thoracolumbar fusion hardware. IMPRESSION: No active disease. Electronically Signed   By: Minerva Fester M.D.   On:  04/01/2023 17:30    Microbiology: Results for orders placed or performed in visit on 02/11/23  COVID-19, Flu A+B and RSV     Status: None   Collection Time: 02/11/23 11:40  AM   Specimen: Nasopharyngeal(NP) swabs in vial transport medium  Result Value Ref Range Status   SARS-CoV-2, NAA Not Detected Not Detected Final   Influenza A, NAA Not Detected Not Detected Final   Influenza B, NAA Not Detected Not Detected Final   RSV, NAA Not Detected Not Detected Final   Test Information: Comment  Final    Comment: This nucleic acid amplification test was developed and its performance characteristics determined by [employer]. Nucleic acid amplification tests include RT-PCR and TMA. This test has not been FDA cleared or approved. This test has been authorized by FDA under an Emergency Use Authorization (EUA). This test is only authorized for the duration of time the declaration that circumstances exist justifying the authorization of the emergency use of in vitro diagnostic tests for detection of SARS-CoV-2 virus and/or diagnosis of COVID-19 infection under section 564(b)(1) of the Act, 21 U.S.C. 960AVW-0(J) (1), unless the authorization is terminated or revoked sooner. When diagnostic testing is negative, the possibility of a false negative result should be considered in the context of a patient's recent exposures and the presence of clinical signs and symptoms consistent with COVID-19. An individual without symptoms of COVID-19 and who is not shedding SARS-CoV-2 virus wo uld expect to have a negative (not detected) result in this assay.     Labs: CBC: Recent Labs  Lab 04/04/23 0340 04/05/23 0319 04/09/23 1325 04/10/23 0301  WBC 8.1 8.4 5.9 6.6  NEUTROABS 5.5 5.3 3.8  --   HGB 13.2 12.4 13.0 11.3*  HCT 38.6 37.2 38.8 33.5*  MCV 90.6 91.9 95.1 93.1  PLT 197 200 255 233   Basic Metabolic Panel: Recent Labs  Lab 04/04/23 0340 04/05/23 0319 04/09/23 1325  04/10/23 0301  NA 139 137 135 134*  K 4.1 4.3 4.7 4.2  CL 104 100 97* 101  CO2 24 24 28 27   GLUCOSE 97 107* 145* 101*  BUN 6* 13 20 18   CREATININE 0.60 0.79 1.18* 0.88  CALCIUM 9.0 9.4 10.1 9.1  MG 1.8 2.3  --   --   PHOS 3.3 3.7  --   --    Liver Function Tests: Recent Labs  Lab 04/04/23 0340 04/05/23 0319  AST 23 23  ALT 12 12  ALKPHOS 52 50  BILITOT 0.9 0.7  PROT 6.0* 5.8*  ALBUMIN 3.0* 2.9*   CBG: Recent Labs  Lab 04/09/23 1331 04/09/23 1533  GLUCAP 153* 106*    Discharge time spent: greater than 30 minutes.  Signed: MDALA-GAUSI, Gwenette Greet, MD [facility] 04/10/2023

## 2023-04-11 ENCOUNTER — Inpatient Hospital Stay: Payer: Medicare HMO | Admitting: Family Medicine

## 2023-04-11 ENCOUNTER — Telehealth: Payer: Self-pay

## 2023-04-11 DIAGNOSIS — I2510 Atherosclerotic heart disease of native coronary artery without angina pectoris: Secondary | ICD-10-CM | POA: Diagnosis not present

## 2023-04-11 DIAGNOSIS — M47814 Spondylosis without myelopathy or radiculopathy, thoracic region: Secondary | ICD-10-CM | POA: Diagnosis not present

## 2023-04-11 DIAGNOSIS — M4716 Other spondylosis with myelopathy, lumbar region: Secondary | ICD-10-CM | POA: Diagnosis not present

## 2023-04-11 DIAGNOSIS — I5020 Unspecified systolic (congestive) heart failure: Secondary | ICD-10-CM | POA: Diagnosis not present

## 2023-04-11 DIAGNOSIS — M5106 Intervertebral disc disorders with myelopathy, lumbar region: Secondary | ICD-10-CM | POA: Diagnosis not present

## 2023-04-11 DIAGNOSIS — I080 Rheumatic disorders of both mitral and aortic valves: Secondary | ICD-10-CM | POA: Diagnosis not present

## 2023-04-11 DIAGNOSIS — I471 Supraventricular tachycardia, unspecified: Secondary | ICD-10-CM | POA: Diagnosis not present

## 2023-04-11 DIAGNOSIS — I110 Hypertensive heart disease with heart failure: Secondary | ICD-10-CM | POA: Diagnosis not present

## 2023-04-11 DIAGNOSIS — G894 Chronic pain syndrome: Secondary | ICD-10-CM | POA: Diagnosis not present

## 2023-04-11 NOTE — Transitions of Care (Post Inpatient/ED Visit) (Signed)
   04/11/2023  Name: Kaitlyn Serrano MRN: [id number] DOB: 1940-08-24  Today's TOC FU Call Status: Today's TOC FU Call Status:: Unsuccessful Call (1st Attempt)  Attempted to reach the patient regarding the most recent Inpatient/ED visit.  Follow Up Plan: Additional outreach attempts will be made to reach the patient to complete the Transitions of Care (Post Inpatient/ED visit) call.   Signature   Kandis Fantasia, LPN [facility] Health Advisor [place] [facility] You Are. We Are. [facility] Direct Dial [phone]

## 2023-04-15 DIAGNOSIS — G894 Chronic pain syndrome: Secondary | ICD-10-CM | POA: Diagnosis not present

## 2023-04-15 DIAGNOSIS — I110 Hypertensive heart disease with heart failure: Secondary | ICD-10-CM | POA: Diagnosis not present

## 2023-04-15 DIAGNOSIS — I471 Supraventricular tachycardia, unspecified: Secondary | ICD-10-CM | POA: Diagnosis not present

## 2023-04-15 DIAGNOSIS — M47814 Spondylosis without myelopathy or radiculopathy, thoracic region: Secondary | ICD-10-CM | POA: Diagnosis not present

## 2023-04-15 DIAGNOSIS — I5020 Unspecified systolic (congestive) heart failure: Secondary | ICD-10-CM | POA: Diagnosis not present

## 2023-04-15 DIAGNOSIS — I080 Rheumatic disorders of both mitral and aortic valves: Secondary | ICD-10-CM | POA: Diagnosis not present

## 2023-04-15 DIAGNOSIS — M4716 Other spondylosis with myelopathy, lumbar region: Secondary | ICD-10-CM | POA: Diagnosis not present

## 2023-04-15 DIAGNOSIS — M5106 Intervertebral disc disorders with myelopathy, lumbar region: Secondary | ICD-10-CM | POA: Diagnosis not present

## 2023-04-15 DIAGNOSIS — I2510 Atherosclerotic heart disease of native coronary artery without angina pectoris: Secondary | ICD-10-CM | POA: Diagnosis not present

## 2023-04-16 ENCOUNTER — Ambulatory Visit (INDEPENDENT_AMBULATORY_CARE_PROVIDER_SITE_OTHER): Payer: Medicare HMO | Admitting: Family Medicine

## 2023-04-16 ENCOUNTER — Inpatient Hospital Stay: Payer: Medicare HMO | Admitting: Family Medicine

## 2023-04-16 ENCOUNTER — Encounter: Payer: Self-pay | Admitting: Family Medicine

## 2023-04-16 VITALS — BP 133/70 | HR 65 | Temp 98.0°F | Ht 62.0 in | Wt 121.0 lb

## 2023-04-16 DIAGNOSIS — I5020 Unspecified systolic (congestive) heart failure: Secondary | ICD-10-CM | POA: Diagnosis not present

## 2023-04-16 DIAGNOSIS — F331 Major depressive disorder, recurrent, moderate: Secondary | ICD-10-CM

## 2023-04-16 DIAGNOSIS — M5106 Intervertebral disc disorders with myelopathy, lumbar region: Secondary | ICD-10-CM | POA: Diagnosis not present

## 2023-04-16 DIAGNOSIS — I2510 Atherosclerotic heart disease of native coronary artery without angina pectoris: Secondary | ICD-10-CM | POA: Diagnosis not present

## 2023-04-16 DIAGNOSIS — M4716 Other spondylosis with myelopathy, lumbar region: Secondary | ICD-10-CM | POA: Diagnosis not present

## 2023-04-16 DIAGNOSIS — G894 Chronic pain syndrome: Secondary | ICD-10-CM | POA: Diagnosis not present

## 2023-04-16 DIAGNOSIS — N179 Acute kidney failure, unspecified: Secondary | ICD-10-CM

## 2023-04-16 DIAGNOSIS — F411 Generalized anxiety disorder: Secondary | ICD-10-CM | POA: Diagnosis not present

## 2023-04-16 DIAGNOSIS — I080 Rheumatic disorders of both mitral and aortic valves: Secondary | ICD-10-CM | POA: Diagnosis not present

## 2023-04-16 DIAGNOSIS — M47814 Spondylosis without myelopathy or radiculopathy, thoracic region: Secondary | ICD-10-CM | POA: Diagnosis not present

## 2023-04-16 DIAGNOSIS — I471 Supraventricular tachycardia, unspecified: Secondary | ICD-10-CM | POA: Diagnosis not present

## 2023-04-16 DIAGNOSIS — R55 Syncope and collapse: Secondary | ICD-10-CM

## 2023-04-16 DIAGNOSIS — I110 Hypertensive heart disease with heart failure: Secondary | ICD-10-CM | POA: Diagnosis not present

## 2023-04-16 MED ORDER — SERTRALINE HCL 50 MG PO TABS: 50.0000 mg | ORAL_TABLET | Freq: Every day | ORAL | 3 refills | Status: DC

## 2023-04-16 NOTE — Progress Notes (Addendum)
Subjective:  Patient ID: Kaitlyn Serrano, female    DOB: 1940-03-25, 83 y.o.   MRN: [id number]  Patient Care Team: Raliegh Ip, DO as PCP - General (Family Medicine) Wyline Mood Dorothe Pea, MD as PCP - Cardiology (Cardiology) Verlin Fester, PA-C as Physician Assistant (Orthopedic Surgery) Durene Romans, MD as Consulting Physician (Orthopedic Surgery) Adam Phenix, DPM as Consulting Physician (Podiatry)   Chief Complaint:  Follow-up (Syncope, cardiac symptoms)  HPI: Kaitlyn Serrano is a 83 y.o. female presenting on 04/16/2023 for Follow-up (Syncope, cardiac symptoms) History obtained from patient, family (grandson, Kaitlyn Serrano) and chart review. Patient has a PMH of CAD, HTN, HLD, Depression and GERD.  Patient presented to ED on 04/01/2023 for syncopal episode. At that time PE was ruled out. She completed cardiac catheterization, which showed nonobstructive CAD. They questioned Takotsubo CM. She was then discharged and presented again to the ED on 04/09/23 with two episodes of syncope that day. She was negative troponin, EKG stable, mild AKI that resolved with IV rehydration. No acute abnormalities on CT. Carotid US with mild amount of calcified plaque at the level of both carotid bifurcations, estimated bilateral ICA stenosis <50%. Carvedilol was changed to metoprolol succinate. She was instructed to hold spiro. Patient was discharged with zio and plan for cardiology follow up in 1 month.  She reports that since coming home she has been doing okay. She reports leg cramps. She has been compliant with medication changes. Her grandson helps her with all of her medications. She has switched over to metop and is not having any side effects. She is holding spiro. Denies any more episodes of falls, syncopal events. Denies dizziness, lightheadedness. She is trying to increase her water intake. States that she had 2 cups of water today. She has follow up scheduled with cardiology on March 13th. They have zio at  home, but have not placed it on.   States that she needs something "stronger" for her her nerves. Currently taking buspar 3 times daily. Does not feel that it is helping. States that she is worried about everything. She has not tried other antianxiety or antidepressants.    Relevant past medical, surgical, family, and social history reviewed and updated as indicated.  Allergies and medications reviewed and updated. Data reviewed: Chart in Epic.   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 family unaware   Dysphagia    Dyspnea    Generalized weakness    GERD (gastroesophageal reflux disease)    GERD (gastroesophageal reflux disease)    High cholesterol    Hoarseness    Hx of cardiovascular stress test    negative 2012 and 2016   Hypertension    Insomnia    Intervertebral disc disorder with myelopathy, lumbar region    Lumbago    Lumbar spine pain    PONV (postoperative nausea and vomiting)    confusion after anesthesia   Renal insufficiency    Ringing in ears    resolved   Spondylosis with myelopathy, lumbar region    legs, back    Past Surgical History:  Procedure Laterality Date   ABDOMINAL HYSTERECTOMY     BACK SURGERY     BONE MARROW ASPIRATION     CERVICAL SPINE SURGERY     EYE SURGERY  Bilateral    Lasic   FIXATION KYPHOPLASTY     LAMINOTOMY     LEFT HEART CATH AND CORONARY ANGIOGRAPHY N/A 04/03/2023   Procedure: LEFT HEART CATH AND CORONARY ANGIOGRAPHY;  Surgeon: Yvonne Kendall, MD;  Location: [facility];  Service: Cardiovascular;  Laterality: N/A;   SPINAL FUSION     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   Procedure: TOTAL HIP ARTHROPLASTY ANTERIOR APPROACH;  Surgeon: Durene Romans, MD;  Location: [facility];  Service: Orthopedics;  Laterality: Right;  70 mins   TOTAL HIP  ARTHROPLASTY Left 05/14/2022   Procedure: TOTAL HIP ARTHROPLASTY ANTERIOR APPROACH;  Surgeon: Durene Romans, MD;  Location: [facility];  Service: Orthopedics;  Laterality: Left;    Social History   Socioeconomic History   Marital status: Divorced    Spouse name: Not on file   Number of children: 2   Years of education: Not on file   Highest education level: Not on file  Occupational History   Not on file  Tobacco Use   Smoking status: Former    Current packs/day: 0.00    Average packs/day: 4.0 packs/day for 3.7 years (14.8 ttl pk-yrs)    Types: Cigarettes    Start date: 06/10/1960    Quit date: 02/20/1964    Years since quitting: 59.1   Smokeless tobacco: Never  Vaping Use   Vaping status: Never Used  Substance and Sexual Activity   Alcohol use: No    Alcohol/week: 0.0 standard drinks of alcohol   Drug use: No   Sexual activity: Not Currently    Birth control/protection: None  Other Topics Concern   Not on file  Social History Narrative   ** Merged History Encounter **       ** Merged History Encounter **  Ms Gramajo lives alone. She is separated/divorced from her second husband and widowed by her first husband. She has twin daughters, Aggie Cosier and Jasmine December. Aggie Cosier has been a cause of stress for her and she believes was preven   ting her from getting medical care that she needed. She is no longer talking to [place]. She states that Jasmine December is involved in her care and that she trusts her to have her best interest in mind.      Social Drivers of Corporate investment banker Strain: Low Risk  (07/11/2022)   Overall Financial Resource Strain (CARDIA)    Difficulty of Paying Living Expenses: Not hard at all  Food Insecurity: No Food Insecurity (04/09/2023)   Hunger Vital Sign    Worried About Running Out of Food in the Last Year: Never true    Ran Out of Food in the Last Year: Never true  Transportation Needs: No Transportation Needs (04/09/2023)   PRAPARE - Scientist, research (physical sciences) (Medical): No    Lack of Transportation (Non-Medical): No  Physical Activity: Insufficiently Active (07/11/2022)   Exercise Vital Sign    Days of Exercise per Week: 3 days    Minutes of Exercise per Session: 30 min  Stress: No Stress Concern Present (07/11/2022)   [name] - Occupational Stress Questionnaire    Feeling of Stress : Not at all  Social Connections: Patient Declined (04/10/2023)   Social Connection and Isolation Panel [NHANES]    Frequency of Communication with Friends and Family: Patient declined    Frequency of Social Gatherings with Friends and Family: Patient declined    Attends Religious Services: Patient declined    Active  Member of Clubs or Organizations: Patient declined    Attends Banker Meetings: Patient declined    Marital Status: Patient declined  Recent Concern: Social Connections - Socially Isolated (04/03/2023)   Social Connection and Isolation Panel [NHANES]    Frequency of Communication with Friends and Family: More than three times a week    Frequency of Social Gatherings with Friends and Family: More than three times a week    Attends Religious Services: Never    Database administrator or Organizations: No    Attends Banker Meetings: Never    Marital Status: Widowed  Intimate Partner Violence: Not At Risk (04/09/2023)   Humiliation, Afraid, Rape, and Kick questionnaire    Fear of Current or Ex-Partner: No    Emotionally Abused: No    Physically Abused: No    Sexually Abused: No    Outpatient Encounter Medications as of 04/16/2023  Medication Sig   aspirin EC 81 MG tablet Take 81 mg by mouth daily. Swallow whole.   busPIRone (BUSPAR) 10 MG tablet Take 1 tablet (10 mg total) by mouth 3 (three) times daily.   celecoxib (CELEBREX) 200 MG capsule Take 200 mg by mouth 2 (two) times daily.   donepezil (ARICEPT) 10 MG tablet Take 1 tablet (10 mg total) by mouth at bedtime. For dementia    ezetimibe (ZETIA) 10 MG tablet TAKE ONE TABLET BY MOUTH DAILY AT 9AM (Patient taking differently: Take 10 mg by mouth daily.)   HYDROcodone-acetaminophen (NORCO) 10-325 MG tablet Take 1 tablet by mouth every 4 (four) hours as needed for severe pain (pain score 7-10).   losartan (COZAAR) 100 MG tablet Take 1 tablet (100 mg total) by mouth daily.   metoprolol succinate (TOPROL XL) 25 MG 24 hr tablet Take 1 tablet (25 mg total) by mouth daily.   mirtazapine (REMERON) 15 MG tablet Take 1 tablet (15 mg total) by mouth at bedtime. For anxiety/ appetite/ sleep   pantoprazole (PROTONIX) 40 MG tablet Take 1 tablet (40 mg total) by mouth 2 (two) times daily.   polyethylene glycol (MIRALAX / GLYCOLAX) 17 g packet Take 17 g by mouth 2 (two) times daily. (Patient taking differently: Take 17 g by mouth daily as needed for mild constipation.)   traZODone (DESYREL) 50 MG tablet Take 50 mg by mouth at bedtime.   No facility-administered encounter medications on file as of 04/16/2023.    Allergies  Allergen Reactions   Bee Venom Anaphylaxis   Penicillins Anaphylaxis and Rash   Amlodipine Swelling   Elavil [Amitriptyline Hcl] Other (See Comments)    Confusion, hallucinations   Statins Swelling and Other (See Comments)    Peeling of skin    Review of Systems As per HPI  Objective:  BP 133/70   Pulse 65   Temp 98 F (36.7 C)   Ht 5\' 2"  (1.575 m)   Wt 121 lb (54.9 kg)   SpO2 97%   BMI 22.13 kg/m    Wt Readings from Last 3 Encounters:  04/16/23 121 lb (54.9 kg)  04/10/23 115 lb 3.2 oz (52.3 kg)  04/03/23 116 lb 3.2 oz (52.7 kg)   Physical Exam Constitutional:      General: She is awake. She is not in acute distress.    Appearance: Normal appearance. She is well-developed and well-groomed. She is not ill-appearing, toxic-appearing or diaphoretic.  Neck:     Vascular: Normal carotid pulses. No carotid bruit.  Cardiovascular:     Rate and  Rhythm: Normal rate and regular rhythm.     Pulses:  Normal pulses.          Radial pulses are 2+ on the right side and 2+ on the left side.       Posterior tibial pulses are 2+ on the right side and 2+ on the left side.     Heart sounds: Normal heart sounds. No murmur heard.    No gallop.  Pulmonary:     Effort: Pulmonary effort is normal. No respiratory distress.     Breath sounds: Normal breath sounds. No stridor. No wheezing, rhonchi or rales.  Musculoskeletal:     Cervical back: Full passive range of motion without pain and neck supple.     Right lower leg: No edema.     Left lower leg: No edema.  Skin:    General: Skin is warm.     Capillary Refill: Capillary refill takes less than 2 seconds.  Neurological:     General: No focal deficit present.     Mental Status: She is alert, oriented to person, place, and time and easily aroused. Mental status is at baseline.     GCS: GCS eye subscore is 4. GCS verbal subscore is 5. GCS motor subscore is 6.     Motor: No weakness.  Psychiatric:        Attention and Perception: Attention and perception normal.        Mood and Affect: Mood and affect normal.        Speech: Speech normal.        Behavior: Behavior normal. Behavior is cooperative.        Thought Content: Thought content normal. Thought content does not include homicidal or suicidal ideation. Thought content does not include homicidal or suicidal plan.        Cognition and Memory: Cognition and memory normal.        Judgment: Judgment normal.     Results for orders placed or performed during the hospital encounter of 04/09/23  Basic metabolic panel   Collection Time: 04/09/23  1:25 PM  Result Value Ref Range   Sodium 135 135 - 145 mmol/L   Potassium 4.7 3.5 - 5.1 mmol/L   Chloride 97 (L) 98 - 111 mmol/L   CO2 28 22 - 32 mmol/L   Glucose, Bld 145 (H) 70 - 99 mg/dL   BUN 20 8 - 23 mg/dL   Creatinine, Ser 6.21 (H) 0.44 - 1.00 mg/dL   Calcium 30.8 8.9 - 65.7 mg/dL   GFR, Estimated 46 (L) >60 mL/min   Anion gap 10 5 - 15   CBC WITH DIFFERENTIAL   Collection Time: 04/09/23  1:25 PM  Result Value Ref Range   WBC 5.9 4.0 - 10.5 K/uL   RBC 4.08 3.87 - 5.11 MIL/uL   Hemoglobin 13.0 12.0 - 15.0 g/dL   HCT 84.6 96.2 - 95.2 %   MCV 95.1 80.0 - 100.0 fL   MCH 31.9 26.0 - 34.0 pg   MCHC 33.5 30.0 - 36.0 g/dL   RDW 84.1 32.4 - 40.1 %   Platelets 255 150 - 400 K/uL   nRBC 0.0 0.0 - 0.2 %   Neutrophils Relative % 64 %   Neutro Abs 3.8 1.7 - 7.7 K/uL   Lymphocytes Relative 16 %   Lymphs Abs 1.0 0.7 - 4.0 K/uL   Monocytes Relative 12 %   Monocytes Absolute 0.7 0.1 - 1.0 K/uL   Eosinophils Relative 6 %  Eosinophils Absolute 0.3 0.0 - 0.5 K/uL   Basophils Relative 1 %   Basophils Absolute 0.1 0.0 - 0.1 K/uL   Immature Granulocytes 1 %   Abs Immature Granulocytes 0.04 0.00 - 0.07 K/uL  Brain natriuretic peptide   Collection Time: 04/09/23  1:25 PM  Result Value Ref Range   B Natriuretic Peptide 110.0 (H) 0.0 - 100.0 pg/mL  Troponin I (High Sensitivity)   Collection Time: 04/09/23  1:25 PM  Result Value Ref Range   Troponin I (High Sensitivity) 26 (H) <18 ng/L  CBG monitoring, ED   Collection Time: 04/09/23  1:31 PM  Result Value Ref Range   Glucose-Capillary 153 (H) 70 - 99 mg/dL  CBG monitoring, ED   Collection Time: 04/09/23  3:33 PM  Result Value Ref Range   Glucose-Capillary 106 (H) 70 - 99 mg/dL  Troponin I (High Sensitivity)   Collection Time: 04/09/23  3:50 PM  Result Value Ref Range   Troponin I (High Sensitivity) 27 (H) <18 ng/L  Basic metabolic panel   Collection Time: 04/10/23  3:01 AM  Result Value Ref Range   Sodium 134 (L) 135 - 145 mmol/L   Potassium 4.2 3.5 - 5.1 mmol/L   Chloride 101 98 - 111 mmol/L   CO2 27 22 - 32 mmol/L   Glucose, Bld 101 (H) 70 - 99 mg/dL   BUN 18 8 - 23 mg/dL   Creatinine, Ser 0.98 0.44 - 1.00 mg/dL   Calcium 9.1 8.9 - 11.9 mg/dL   GFR, Estimated >14 >78 mL/min   Anion gap 6 5 - 15  CBC   Collection Time: 04/10/23  3:01 AM  Result Value Ref Range    WBC 6.6 4.0 - 10.5 K/uL   RBC 3.60 (L) 3.87 - 5.11 MIL/uL   Hemoglobin 11.3 (L) 12.0 - 15.0 g/dL   HCT 29.5 (L) 62.1 - 30.8 %   MCV 93.1 80.0 - 100.0 fL   MCH 31.4 26.0 - 34.0 pg   MCHC 33.7 30.0 - 36.0 g/dL   RDW 65.7 84.6 - 96.2 %   Platelets 233 150 - 400 K/uL   nRBC 0.0 0.0 - 0.2 %       04/16/2023   11:08 AM 01/16/2023    2:16 PM 10/02/2022    8:20 AM 09/19/2022    2:40 PM 07/11/2022    2:25 PM  Depression screen PHQ 2/9  Decreased Interest 2 3 0 1 0  Down, Depressed, Hopeless 2 0 0 2 0  PHQ - 2 Score 4 3 0 3 0  Altered sleeping 0 0 0 0 0  Tired, decreased energy 1 3 0 0 0  Change in appetite 1 3 1  0 0  Feeling bad or failure about yourself  3 0 0 1   Trouble concentrating 1 0 1 2 0  Moving slowly or fidgety/restless 0 0 0 0 0  Suicidal thoughts 0 0 0 0 0  PHQ-9 Score 10 9 2 6  0  Difficult doing work/chores Not difficult at all Not difficult at all Somewhat difficult Somewhat difficult Not difficult at all       01/16/2023    2:16 PM 10/02/2022    8:20 AM 09/19/2022    2:40 PM 06/24/2022    3:00 PM  GAD 7 : Generalized Anxiety Score  Nervous, Anxious, on Edge 1 1 3 3   Control/stop worrying 1 0 3 3  Worry too much - different things 1 1 3 3   Trouble relaxing  1 0 3 2  Restless 0 0 2 3  Easily annoyed or irritable 0 0 0 3  Afraid - awful might happen 0 1 2 2   Total GAD 7 Score 4 3 16 19   Anxiety Difficulty Somewhat difficult Somewhat difficult Very difficult Very difficult   Pertinent labs & imaging results that were available during my care of the patient were reviewed by me and considered in my medical decision making.  Assessment & Plan:  Korina was seen today for follow-up.  Diagnoses and all orders for this visit:  Syncope, unspecified syncope type Reviewed notes from admission visit on 04/01/23, Loney Loh, MD and [place], DO on 04/05/23. Reviewed notes from Admission 04/09/23-04/10/23, Mansy, MD, and Mdala-Gausi, MD. Patient to continue current medications. Continue  to increase oral hydration. Follow up with Cardiology scheduled. Patient to place Zio. If they have issues with zio, can make an appt with Triage RN for assistance.  Reviewed imaging below.  -     CBC with Differential/Platelet -     CMP14+EGFR -     Magnesium  CT Angio Chest/Abd/Pel for Dissection W and/or Wo Contrast (Order #[id number]) on 04/09/2023 - Order Result History Report   US Carotid Bilateral (Order #[id number]) on 04/10/2023 - Order Result History Report   AKI (acute kidney injury) (HCC) Labs as below. Will communicate results to patient once available. Will await results to determine next steps.  -     CMP14+EGFR -     Magnesium  GAD (generalized anxiety disorder) Not at goal. Patient would like to start something else in addition to buspar. She denies SI. Safety contract established. Will start with low dose and titrate up. Will have close follow up with PCP. Per grandson, Kaitlyn Serrano, patient is not currently taking Remeron. Instructed for patient to not take remeron and zoloft together. Verbalized understanding.  -     sertraline (ZOLOFT) 50 MG tablet; Take 1 tablet (50 mg total) by mouth daily.  Moderate episode of recurrent major depressive disorder (HCC) As above.  -     sertraline (ZOLOFT) 50 MG tablet; Take 1 tablet (50 mg total) by mouth daily.  Continue all other maintenance medications.  Follow up plan: Return in about 6 weeks (around 05/28/2023) for Chronic Condition Follow up.   Continue healthy lifestyle choices, including diet (rich in fruits, vegetables, and lean proteins, and low in salt and simple carbohydrates) and exercise (at least 30 minutes of moderate physical activity daily).  Written and verbal instructions provided   The above assessment and management plan was discussed with the patient. The patient verbalized understanding of and has agreed to the management plan. Patient is aware to call the clinic if they develop any new symptoms or if symptoms persist  or worsen. Patient is aware when to return to the clinic for a follow-up visit. Patient educated on when it is appropriate to go to the emergency department.   Neale Burly, DNP-FNP [facility] [address] [place], [place] [address] [phone]

## 2023-04-16 NOTE — Addendum Note (Signed)
Addended by: Neale Burly on: 04/16/2023 12:16 PM   Modules accepted: Orders

## 2023-04-16 NOTE — Patient Instructions (Signed)
Take 1/2 tablet of zoloft for 8 days then may increase to whole tablet

## 2023-04-17 ENCOUNTER — Encounter: Payer: Self-pay | Admitting: Family Medicine

## 2023-04-17 LAB — MAGNESIUM: Magnesium: 2.1 mg/dL (ref 1.6–2.3)

## 2023-04-17 LAB — CBC WITH DIFFERENTIAL/PLATELET
Basophils Absolute: 0.1 10*3/uL (ref 0.0–0.2)
Basos: 1 %
EOS (ABSOLUTE): 0.6 10*3/uL — ABNORMAL HIGH (ref 0.0–0.4)
Eos: 6 %
Hematocrit: 36.3 % (ref 34.0–46.6)
Hemoglobin: 12 g/dL (ref 11.1–15.9)
Immature Grans (Abs): 0 10*3/uL (ref 0.0–0.1)
Immature Granulocytes: 0 %
Lymphocytes Absolute: 1.3 10*3/uL (ref 0.7–3.1)
Lymphs: 12 %
MCH: 31.2 pg (ref 26.6–33.0)
MCHC: 33.1 g/dL (ref 31.5–35.7)
MCV: 94 fL (ref 79–97)
Monocytes Absolute: 1.1 10*3/uL — ABNORMAL HIGH (ref 0.1–0.9)
Monocytes: 10 %
Neutrophils Absolute: 7.4 10*3/uL — ABNORMAL HIGH (ref 1.4–7.0)
Neutrophils: 71 %
Platelets: 294 10*3/uL (ref 150–450)
RBC: 3.85 x10E6/uL (ref 3.77–5.28)
RDW: 12.4 % (ref 11.7–15.4)
WBC: 10.5 10*3/uL (ref 3.4–10.8)

## 2023-04-17 LAB — CMP14+EGFR
ALT: 8 [IU]/L (ref 0–32)
AST: 20 [IU]/L (ref 0–40)
Albumin: 3.9 g/dL (ref 3.7–4.7)
Alkaline Phosphatase: 79 [IU]/L (ref 44–121)
BUN/Creatinine Ratio: 14 (ref 12–28)
BUN: 14 mg/dL (ref 8–27)
Bilirubin Total: 0.3 mg/dL (ref 0.0–1.2)
CO2: 25 mmol/L (ref 20–29)
Calcium: 9.6 mg/dL (ref 8.7–10.3)
Chloride: 97 mmol/L (ref 96–106)
Creatinine, Ser: 0.98 mg/dL (ref 0.57–1.00)
Globulin, Total: 2.2 g/dL (ref 1.5–4.5)
Glucose: 68 mg/dL — ABNORMAL LOW (ref 70–99)
Potassium: 5.3 mmol/L — ABNORMAL HIGH (ref 3.5–5.2)
Sodium: 135 mmol/L (ref 134–144)
Total Protein: 6.1 g/dL (ref 6.0–8.5)
eGFR: 58 mL/min/{1.73_m2} — ABNORMAL LOW (ref >59)

## 2023-04-17 NOTE — Progress Notes (Signed)
Slight variations on CBC, not concerning at this time. Anemia resolved. BG low, monitor BG at home. Decreased GFR, recommend increasing hydration. Avoid nephrotoxic medications such as NSAIDs. Slightly elevated potassium, would like patient to come in one week for repeat labs to monitor.

## 2023-04-17 NOTE — Addendum Note (Signed)
Addended by: Neale Burly on: 04/17/2023 01:19 PM   Modules accepted: Orders

## 2023-04-18 ENCOUNTER — Ambulatory Visit: Payer: Medicare HMO

## 2023-04-18 DIAGNOSIS — I080 Rheumatic disorders of both mitral and aortic valves: Secondary | ICD-10-CM | POA: Diagnosis not present

## 2023-04-18 DIAGNOSIS — I5020 Unspecified systolic (congestive) heart failure: Secondary | ICD-10-CM | POA: Diagnosis not present

## 2023-04-18 DIAGNOSIS — I110 Hypertensive heart disease with heart failure: Secondary | ICD-10-CM

## 2023-04-18 DIAGNOSIS — I2510 Atherosclerotic heart disease of native coronary artery without angina pectoris: Secondary | ICD-10-CM | POA: Diagnosis not present

## 2023-04-18 DIAGNOSIS — M4716 Other spondylosis with myelopathy, lumbar region: Secondary | ICD-10-CM | POA: Diagnosis not present

## 2023-04-18 DIAGNOSIS — G894 Chronic pain syndrome: Secondary | ICD-10-CM

## 2023-04-18 DIAGNOSIS — I471 Supraventricular tachycardia, unspecified: Secondary | ICD-10-CM | POA: Diagnosis not present

## 2023-04-18 DIAGNOSIS — M47814 Spondylosis without myelopathy or radiculopathy, thoracic region: Secondary | ICD-10-CM | POA: Diagnosis not present

## 2023-04-18 DIAGNOSIS — I700 Atherosclerosis of aorta: Secondary | ICD-10-CM

## 2023-04-18 DIAGNOSIS — E782 Mixed hyperlipidemia: Secondary | ICD-10-CM

## 2023-04-18 DIAGNOSIS — M5106 Intervertebral disc disorders with myelopathy, lumbar region: Secondary | ICD-10-CM

## 2023-04-18 DIAGNOSIS — K219 Gastro-esophageal reflux disease without esophagitis: Secondary | ICD-10-CM

## 2023-04-22 ENCOUNTER — Other Ambulatory Visit: Payer: Self-pay | Admitting: Family Medicine

## 2023-04-22 DIAGNOSIS — I471 Supraventricular tachycardia, unspecified: Secondary | ICD-10-CM | POA: Diagnosis not present

## 2023-04-22 DIAGNOSIS — I080 Rheumatic disorders of both mitral and aortic valves: Secondary | ICD-10-CM | POA: Diagnosis not present

## 2023-04-22 DIAGNOSIS — I110 Hypertensive heart disease with heart failure: Secondary | ICD-10-CM | POA: Diagnosis not present

## 2023-04-22 DIAGNOSIS — M47814 Spondylosis without myelopathy or radiculopathy, thoracic region: Secondary | ICD-10-CM | POA: Diagnosis not present

## 2023-04-22 DIAGNOSIS — I2510 Atherosclerotic heart disease of native coronary artery without angina pectoris: Secondary | ICD-10-CM | POA: Diagnosis not present

## 2023-04-22 DIAGNOSIS — M4716 Other spondylosis with myelopathy, lumbar region: Secondary | ICD-10-CM | POA: Diagnosis not present

## 2023-04-22 DIAGNOSIS — G894 Chronic pain syndrome: Secondary | ICD-10-CM | POA: Diagnosis not present

## 2023-04-22 DIAGNOSIS — M5106 Intervertebral disc disorders with myelopathy, lumbar region: Secondary | ICD-10-CM | POA: Diagnosis not present

## 2023-04-22 DIAGNOSIS — I5020 Unspecified systolic (congestive) heart failure: Secondary | ICD-10-CM | POA: Diagnosis not present

## 2023-04-22 NOTE — Telephone Encounter (Signed)
 Trazodone NOT appropriate given concerns for cardiac etiology of recent syncope. This increases risk of heart beat abnormality, especially with the Zoloft.

## 2023-04-22 NOTE — Telephone Encounter (Signed)
 Last Fill: Trazodone: Unknown     Lisinopril: 04/12/22     Atenolol: 04/12/22     Diltiazem: 04/12/22  Last OV: 04/16/23 Next OV: 05/28/23  Routing to provider for review/authorization.

## 2023-04-22 NOTE — Telephone Encounter (Signed)
 Copied from CRM [id number]. Topic: Clinical - Medication Refill >> Apr 22, 2023 11:42 AM Fuller Mandril wrote: Most Recent Primary Care Visit:  Provider: [facility] - HOME HEALTH  Department: [facility]  Visit Type: OFFICE VISIT  Date: 04/18/2023  Medication:  traZODone (DESYREL) 50 MG tablet lisinopril (ZESTRIL) tablet 40 mg atenolol (TENORMIN) tablet 25 mg diltiazem (CARDIZEM CD) 24 hr capsule 120 mg  Has the patient contacted their pharmacy? Yes (Agent: If no, request that the patient contact the pharmacy for the refill. If patient does not wish to contact the pharmacy document the reason why and proceed with request.) (Agent: If yes, when and what did the pharmacy advise?) Pharmacy called to request   Is this the correct pharmacy for this prescription? Yes If no, delete pharmacy and type the correct one.  This is the patient's preferred pharmacy:   [facility] [place] [address] [place] [address] Phone: [phone] Fax: [phone]   Has the prescription been filled recently? Yes  Is the patient out of the medication? No  Has the patient been seen for an appointment in the last year OR does the patient have an upcoming appointment? Yes  Can we respond through MyChart? No  Agent: Please be advised that Rx refills may take up to 3 business days. We ask that you follow-up with your pharmacy.

## 2023-04-23 ENCOUNTER — Encounter: Payer: Self-pay | Admitting: *Deleted

## 2023-04-24 DIAGNOSIS — I2510 Atherosclerotic heart disease of native coronary artery without angina pectoris: Secondary | ICD-10-CM | POA: Diagnosis not present

## 2023-04-24 DIAGNOSIS — I5020 Unspecified systolic (congestive) heart failure: Secondary | ICD-10-CM | POA: Diagnosis not present

## 2023-04-24 DIAGNOSIS — I110 Hypertensive heart disease with heart failure: Secondary | ICD-10-CM | POA: Diagnosis not present

## 2023-04-24 DIAGNOSIS — G894 Chronic pain syndrome: Secondary | ICD-10-CM | POA: Diagnosis not present

## 2023-04-24 DIAGNOSIS — M5106 Intervertebral disc disorders with myelopathy, lumbar region: Secondary | ICD-10-CM | POA: Diagnosis not present

## 2023-04-24 DIAGNOSIS — M4716 Other spondylosis with myelopathy, lumbar region: Secondary | ICD-10-CM | POA: Diagnosis not present

## 2023-04-24 DIAGNOSIS — I471 Supraventricular tachycardia, unspecified: Secondary | ICD-10-CM | POA: Diagnosis not present

## 2023-04-24 DIAGNOSIS — M47814 Spondylosis without myelopathy or radiculopathy, thoracic region: Secondary | ICD-10-CM | POA: Diagnosis not present

## 2023-04-24 DIAGNOSIS — I080 Rheumatic disorders of both mitral and aortic valves: Secondary | ICD-10-CM | POA: Diagnosis not present

## 2023-04-25 ENCOUNTER — Telehealth: Payer: Self-pay | Admitting: *Deleted

## 2023-04-25 ENCOUNTER — Telehealth: Payer: Self-pay

## 2023-04-25 DIAGNOSIS — I10 Essential (primary) hypertension: Secondary | ICD-10-CM

## 2023-04-25 NOTE — Telephone Encounter (Signed)
 Fax from [facility] RE RF request for Atenolol Not on current med list, says [facility] 04/05/23, not finding who/on this date DCd Please advise

## 2023-04-25 NOTE — Telephone Encounter (Signed)
 [facility] asking for refill of diltiazem 120 mg daily.   We have not seen patient since 03/2022.  We have that she is allergic to amlodipine (calcium channel blocker) with report swelling.  I have asked daughter to call back to discuss.  She has apt in March with E.Peck to clarify. It appears she has been on diltiazem before so I am not sure about this allergy

## 2023-04-25 NOTE — Addendum Note (Signed)
 Addended by: Julious Payer D on: 04/25/2023 11:46 AM   Modules accepted: Orders

## 2023-04-25 NOTE — Telephone Encounter (Signed)
 Refill denied and sent back to pharmcy

## 2023-04-25 NOTE — Telephone Encounter (Signed)
 I spoke with daughter,Angela. She literally spelled out patients medications and she is NOT on diltiazem. She confirmed losartan and metoprolol, trazodone,sertraline, and celebrex.   I spoke with [facility] and they state the PCP is the provider for diltiazem. They will call the patient daughter back.

## 2023-04-25 NOTE — Telephone Encounter (Signed)
 He was discontinued in the hospital because they replaced her atenolol with metoprolol.  She cannot take both as they are the same class of medicine.  She should continue metoprolol as prescribed

## 2023-04-28 DIAGNOSIS — M5106 Intervertebral disc disorders with myelopathy, lumbar region: Secondary | ICD-10-CM | POA: Diagnosis not present

## 2023-04-28 DIAGNOSIS — M47814 Spondylosis without myelopathy or radiculopathy, thoracic region: Secondary | ICD-10-CM | POA: Diagnosis not present

## 2023-04-28 DIAGNOSIS — G894 Chronic pain syndrome: Secondary | ICD-10-CM | POA: Diagnosis not present

## 2023-04-28 DIAGNOSIS — I5020 Unspecified systolic (congestive) heart failure: Secondary | ICD-10-CM | POA: Diagnosis not present

## 2023-04-28 DIAGNOSIS — I471 Supraventricular tachycardia, unspecified: Secondary | ICD-10-CM | POA: Diagnosis not present

## 2023-04-28 DIAGNOSIS — I080 Rheumatic disorders of both mitral and aortic valves: Secondary | ICD-10-CM | POA: Diagnosis not present

## 2023-04-28 DIAGNOSIS — I110 Hypertensive heart disease with heart failure: Secondary | ICD-10-CM | POA: Diagnosis not present

## 2023-04-28 DIAGNOSIS — M4716 Other spondylosis with myelopathy, lumbar region: Secondary | ICD-10-CM | POA: Diagnosis not present

## 2023-04-28 DIAGNOSIS — I2510 Atherosclerotic heart disease of native coronary artery without angina pectoris: Secondary | ICD-10-CM | POA: Diagnosis not present

## 2023-04-29 ENCOUNTER — Telehealth: Payer: Self-pay | Admitting: *Deleted

## 2023-04-29 NOTE — Telephone Encounter (Signed)
 VM from [place] w/ [facility] FYI: pt is not taking her ASA daily as prescribed, or weighing daily. Trying to educate her on heart failure, nutrition, weight and medication. Just wanted to have this documented.

## 2023-04-30 DIAGNOSIS — M5106 Intervertebral disc disorders with myelopathy, lumbar region: Secondary | ICD-10-CM | POA: Diagnosis not present

## 2023-04-30 DIAGNOSIS — G894 Chronic pain syndrome: Secondary | ICD-10-CM | POA: Diagnosis not present

## 2023-04-30 DIAGNOSIS — I2510 Atherosclerotic heart disease of native coronary artery without angina pectoris: Secondary | ICD-10-CM | POA: Diagnosis not present

## 2023-04-30 DIAGNOSIS — M47814 Spondylosis without myelopathy or radiculopathy, thoracic region: Secondary | ICD-10-CM | POA: Diagnosis not present

## 2023-04-30 DIAGNOSIS — I5020 Unspecified systolic (congestive) heart failure: Secondary | ICD-10-CM | POA: Diagnosis not present

## 2023-04-30 DIAGNOSIS — I471 Supraventricular tachycardia, unspecified: Secondary | ICD-10-CM | POA: Diagnosis not present

## 2023-04-30 DIAGNOSIS — I110 Hypertensive heart disease with heart failure: Secondary | ICD-10-CM | POA: Diagnosis not present

## 2023-04-30 DIAGNOSIS — M4716 Other spondylosis with myelopathy, lumbar region: Secondary | ICD-10-CM | POA: Diagnosis not present

## 2023-04-30 DIAGNOSIS — I080 Rheumatic disorders of both mitral and aortic valves: Secondary | ICD-10-CM | POA: Diagnosis not present

## 2023-05-01 ENCOUNTER — Other Ambulatory Visit: Payer: Self-pay | Admitting: Family Medicine

## 2023-05-01 DIAGNOSIS — F411 Generalized anxiety disorder: Secondary | ICD-10-CM

## 2023-05-01 NOTE — Telephone Encounter (Signed)
 Copied from CRM [id number]. Topic: Clinical - Medication Refill >> May 01, 2023 12:30 PM DeAngela L wrote: Most Recent Primary Care Visit:  Provider: [facility] - HOME HE[facility]  Department: [facility]  Visit Type: OFFICE VISIT  Date: 04/18/2023  Medication:  busPIRone (BUSPAR) 10 MG tablet  Mirtazapine   Has the patient contacted their pharmacy? yes (Agent: If no, request that the patient contact the pharmacy for the refill. If patient does not wish to contact the pharmacy document the reason why and proceed with request.) (Agent: If yes, when and what did the pharmacy advise?)  Is this the correct pharmacy for this prescription? yes If no, delete pharmacy and type the correct one.  This is the patient's preferred pharmacy:    [facility] [place] [address] [place] [address] Phone: [phone] Fax: [phone]   Has the prescription been filled recently? yes  Is the patient out of the medication? yes  Has the patient been seen for an appointment in the last year OR does the patient have an upcoming appointment? yes  Can we respond through MyChart? yes  Agent: Please be advised that Rx refills may take up to 3 business days. We ask that you follow-up with your pharmacy.

## 2023-05-02 ENCOUNTER — Other Ambulatory Visit: Payer: Self-pay | Admitting: Family Medicine

## 2023-05-02 DIAGNOSIS — I5020 Unspecified systolic (congestive) heart failure: Secondary | ICD-10-CM | POA: Diagnosis not present

## 2023-05-02 DIAGNOSIS — I2510 Atherosclerotic heart disease of native coronary artery without angina pectoris: Secondary | ICD-10-CM | POA: Diagnosis not present

## 2023-05-02 DIAGNOSIS — I080 Rheumatic disorders of both mitral and aortic valves: Secondary | ICD-10-CM | POA: Diagnosis not present

## 2023-05-02 DIAGNOSIS — M47814 Spondylosis without myelopathy or radiculopathy, thoracic region: Secondary | ICD-10-CM | POA: Diagnosis not present

## 2023-05-02 DIAGNOSIS — F411 Generalized anxiety disorder: Secondary | ICD-10-CM

## 2023-05-02 DIAGNOSIS — I110 Hypertensive heart disease with heart failure: Secondary | ICD-10-CM | POA: Diagnosis not present

## 2023-05-02 DIAGNOSIS — I471 Supraventricular tachycardia, unspecified: Secondary | ICD-10-CM | POA: Diagnosis not present

## 2023-05-02 DIAGNOSIS — G894 Chronic pain syndrome: Secondary | ICD-10-CM | POA: Diagnosis not present

## 2023-05-02 DIAGNOSIS — M4716 Other spondylosis with myelopathy, lumbar region: Secondary | ICD-10-CM | POA: Diagnosis not present

## 2023-05-02 DIAGNOSIS — M5106 Intervertebral disc disorders with myelopathy, lumbar region: Secondary | ICD-10-CM | POA: Diagnosis not present

## 2023-05-02 MED ORDER — BUSPIRONE HCL 10 MG PO TABS: 10.0000 mg | ORAL_TABLET | Freq: Three times a day (TID) | ORAL | 0 refills | Status: DC

## 2023-05-02 NOTE — Telephone Encounter (Signed)
 Buspar sent. Celebrex NO LONGER APPROPRIATE since she has had a heart attack. SHE NEEDS TO STOP MED if she has been taking the celebrex

## 2023-05-05 ENCOUNTER — Other Ambulatory Visit: Payer: Self-pay | Admitting: Family Medicine

## 2023-05-05 NOTE — Telephone Encounter (Signed)
 Copied from CRM [id number]. Topic: Clinical - Medication Refill >> May 05, 2023 10:00 AM Albin Felling L wrote: Medication Refill - Most Recent Primary Care Visit:   Medication: losartan (COZAAR) 100 MG table traZODone (DESYREL) 50 MG tablet  Has the patient contacted their pharmacy? Yes Told to contact the office.   Is this the correct pharmacy for this prescription? Yes This is the patient's preferred pharmacy:  [id number] [place] [place] - [address] [place] [address] Phone: [phone] Fax: [phone]  Has the prescription been filled recently? Yes  Is the patient out of the medication? Yes  Has the patient been seen for an appointment in the last year OR does the patient have an upcoming appointment? Yes  Can we respond through MyChart? No  Agent: Please be advised that Rx refills may take up to 3 business days. We ask that you follow-up with your pharmacy.

## 2023-05-05 NOTE — Telephone Encounter (Signed)
 Patient informed to stop taking celebrex while requesting refills for other medications.

## 2023-05-07 ENCOUNTER — Other Ambulatory Visit: Payer: Self-pay | Admitting: Family Medicine

## 2023-05-07 ENCOUNTER — Telehealth (HOSPITAL_COMMUNITY): Payer: Self-pay

## 2023-05-07 DIAGNOSIS — I471 Supraventricular tachycardia, unspecified: Secondary | ICD-10-CM | POA: Diagnosis not present

## 2023-05-07 DIAGNOSIS — M4716 Other spondylosis with myelopathy, lumbar region: Secondary | ICD-10-CM | POA: Diagnosis not present

## 2023-05-07 DIAGNOSIS — I110 Hypertensive heart disease with heart failure: Secondary | ICD-10-CM | POA: Diagnosis not present

## 2023-05-07 DIAGNOSIS — M5106 Intervertebral disc disorders with myelopathy, lumbar region: Secondary | ICD-10-CM | POA: Diagnosis not present

## 2023-05-07 DIAGNOSIS — I2510 Atherosclerotic heart disease of native coronary artery without angina pectoris: Secondary | ICD-10-CM | POA: Diagnosis not present

## 2023-05-07 DIAGNOSIS — M47814 Spondylosis without myelopathy or radiculopathy, thoracic region: Secondary | ICD-10-CM | POA: Diagnosis not present

## 2023-05-07 DIAGNOSIS — I5020 Unspecified systolic (congestive) heart failure: Secondary | ICD-10-CM | POA: Diagnosis not present

## 2023-05-07 DIAGNOSIS — G894 Chronic pain syndrome: Secondary | ICD-10-CM | POA: Diagnosis not present

## 2023-05-07 DIAGNOSIS — I080 Rheumatic disorders of both mitral and aortic valves: Secondary | ICD-10-CM | POA: Diagnosis not present

## 2023-05-07 NOTE — Telephone Encounter (Unsigned)
 Copied from CRM [id number]. Topic: Clinical - Medication Refill >> May 07, 2023 10:27 AM Martha Clan wrote: Most Recent Primary Care Visit:   Medication: donepezil (ARICEPT) 10 MG tablet [[id number]] metoprolol succinate (TOPROL XL) 25 MG 24 hr tablet [[id number]] pantoprazole (PROTONIX) 40 MG tablet [[id number]] sertraline (ZOLOFT) 50 MG tablet [[id number]]  Has the patient contacted their pharmacy? Yes (Agent: If no, request that the patient contact the pharmacy for the refill. If patient does not wish to contact the pharmacy document the reason why and proceed with request.) (Agent: If yes, when and what did the pharmacy advise?)  Is this the correct pharmacy for this prescription? Yes If no, delete pharmacy and type the correct one.  This is the patient's preferred pharmacy:  [id number] [place], [place] - [address] [place] [address] Phone: [phone] Fax: [phone]    Has the prescription been filled recently? No  Is the patient out of the medication? Yes  Has the patient been seen for an appointment in the last year OR does the patient have an upcoming appointment? Yes  Can we respond through MyChart? Yes  Agent: Please be advised that Rx refills may take up to 3 business days. We ask that you follow-up with your pharmacy.

## 2023-05-07 NOTE — Telephone Encounter (Signed)
 Called patient regarding referral to cardiac rehab. No answer and no VM option.

## 2023-05-08 ENCOUNTER — Encounter: Payer: Self-pay | Admitting: Family Medicine

## 2023-05-08 ENCOUNTER — Ambulatory Visit: Payer: Medicare HMO | Admitting: Nurse Practitioner

## 2023-05-08 ENCOUNTER — Ambulatory Visit
Admission: RE | Admit: 2023-05-08 | Discharge: 2023-05-08 | Disposition: A | Discharge status: Home / Self Care | Payer: Self-pay | Source: Ambulatory Visit | Attending: Family Medicine | Admitting: Family Medicine

## 2023-05-08 ENCOUNTER — Other Ambulatory Visit: Payer: Self-pay | Admitting: Family Medicine

## 2023-05-08 ENCOUNTER — Ambulatory Visit: Admitting: Family Medicine

## 2023-05-08 ENCOUNTER — Ambulatory Visit (INDEPENDENT_AMBULATORY_CARE_PROVIDER_SITE_OTHER)

## 2023-05-08 VITALS — BP 189/82 | HR 72 | Temp 97.7°F | Ht 62.0 in | Wt 116.0 lb

## 2023-05-08 DIAGNOSIS — R0781 Pleurodynia: Secondary | ICD-10-CM

## 2023-05-08 DIAGNOSIS — R519 Headache, unspecified: Secondary | ICD-10-CM

## 2023-05-08 DIAGNOSIS — W19XXXA Unspecified fall, initial encounter: Secondary | ICD-10-CM

## 2023-05-08 DIAGNOSIS — F411 Generalized anxiety disorder: Secondary | ICD-10-CM

## 2023-05-08 DIAGNOSIS — S2242XA Multiple fractures of ribs, left side, initial encounter for closed fracture: Secondary | ICD-10-CM | POA: Diagnosis not present

## 2023-05-08 DIAGNOSIS — F331 Major depressive disorder, recurrent, moderate: Secondary | ICD-10-CM

## 2023-05-08 DIAGNOSIS — S20219D Contusion of unspecified front wall of thorax, subsequent encounter: Secondary | ICD-10-CM | POA: Diagnosis not present

## 2023-05-08 DIAGNOSIS — S022XXA Fracture of nasal bones, initial encounter for closed fracture: Secondary | ICD-10-CM | POA: Diagnosis not present

## 2023-05-08 NOTE — Addendum Note (Signed)
 Addended by: Dorene Sorrow on: 05/08/2023 10:32 AM   Modules accepted: Orders

## 2023-05-08 NOTE — Progress Notes (Signed)
 BP (!) 189/82   Pulse 72   Temp 97.7 F (36.5 C)   Ht 5\' 2"  (1.575 m)   Wt 116 lb (52.6 kg)   SpO2 96%   BMI 21.22 kg/m    Subjective:   Patient ID: Kaitlyn Serrano, female    DOB: 1940-06-25, 83 y.o.   MRN: [id number]  HPI: Kaitlyn Serrano is a 83 y.o. female presenting on 05/08/2023 for Fall (Left side of face has bruising and left chest as well)   HPI Fall Patient had a fall last night when she was try to get out of bed and says that her sheets got caught on her feet and then she fell forward onto hardwood floors landing slightly on the left side of her body and her face.  She has bruising and a little bit of pain above her eye on the left face but denies any vision changes or pain with range of motion of the eyes.  She says the majority of her pain is on the left side of her upper chest wall where she does have some bruising there and has point tenderness and it does hurt to take deep inspiration.  She denies any shortness of breath or wheezing.  Relevant past medical, surgical, family and social history reviewed and updated as indicated. Interim medical history since our last visit reviewed. Allergies and medications reviewed and updated.  Review of Systems  Constitutional:  Negative for chills and fever.  HENT:  Negative for congestion, ear discharge and ear pain.   Eyes:  Negative for redness and visual disturbance.  Respiratory:  Negative for chest tightness, shortness of breath and wheezing.   Cardiovascular:  Positive for chest pain. Negative for palpitations and leg swelling.  Gastrointestinal:  Negative for abdominal pain.  Musculoskeletal:  Negative for back pain and gait problem.  Skin:  Positive for color change. Negative for rash and wound.  Neurological:  Negative for light-headedness and headaches.  Psychiatric/Behavioral:  Negative for agitation and behavioral problems.   All other systems reviewed and are negative.   Per HPI unless specifically indicated  above   Allergies as of 05/08/2023       Reactions   Bee Venom Anaphylaxis   Penicillins Anaphylaxis, Rash   Amlodipine Swelling   Elavil [amitriptyline Hcl] Other (See Comments)   Confusion, hallucinations   Statins Swelling, Other (See Comments)   Peeling of skin        Medication List        Accurate as of May 08, 2023 10:20 AM. If you have any questions, ask your nurse or doctor.          aspirin EC 81 MG tablet Take 81 mg by mouth daily. Swallow whole.   busPIRone 10 MG tablet Commonly known as: BUSPAR Take 1 tablet (10 mg total) by mouth 3 (three) times daily.   donepezil 10 MG tablet Commonly known as: ARICEPT Take 1 tablet (10 mg total) by mouth at bedtime. For dementia   ezetimibe 10 MG tablet Commonly known as: ZETIA TAKE ONE TABLET BY MOUTH DAILY AT 9AM What changed: See the new instructions.   HYDROcodone-acetaminophen 10-325 MG tablet Commonly known as: NORCO Take 1 tablet by mouth every 4 (four) hours as needed for severe pain (pain score 7-10).   losartan 100 MG tablet Commonly known as: COZAAR Take 1 tablet (100 mg total) by mouth daily.   metoprolol succinate 25 MG 24 hr tablet Commonly known as: Toprol XL Take  1 tablet (25 mg total) by mouth daily.   pantoprazole 40 MG tablet Commonly known as: PROTONIX Take 1 tablet (40 mg total) by mouth 2 (two) times daily.   polyethylene glycol 17 g packet Commonly known as: MIRALAX / GLYCOLAX Take 17 g by mouth 2 (two) times daily. What changed:  when to take this reasons to take this   sertraline 50 MG tablet Commonly known as: ZOLOFT Take 1 tablet (50 mg total) by mouth daily.   traZODone 50 MG tablet Commonly known as: DESYREL Take 50 mg by mouth at bedtime.               Durable Medical Equipment  (From admission, onward)           Start     Ordered   05/08/23 0000  For home use only DME Other see comment       Comments: Incentive spirometer Use incentive  spirometer every hour to prevent pneumonia Diagnosis: Left rib fracture  Question:  Length of Need  Answer:  6 Months   05/08/23 1020             Objective:   BP (!) 189/82   Pulse 72   Temp 97.7 F (36.5 C)   Ht 5\' 2"  (1.575 m)   Wt 116 lb (52.6 kg)   SpO2 96%   BMI 21.22 kg/m   Wt Readings from Last 3 Encounters:  05/08/23 116 lb (52.6 kg)  04/16/23 121 lb (54.9 kg)  04/10/23 115 lb 3.2 oz (52.3 kg)    Physical Exam Vitals and nursing note reviewed.  Constitutional:      General: She is not in acute distress.    Appearance: Normal appearance. She is well-developed. She is not diaphoretic.  HENT:     Head: Contusion present. No raccoon eyes or laceration.   Eyes:     Extraocular Movements: Extraocular movements intact.     Conjunctiva/sclera: Conjunctivae normal.     Pupils: Pupils are equal, round, and reactive to light.  Cardiovascular:     Rate and Rhythm: Normal rate and regular rhythm.     Heart sounds: Normal heart sounds. No murmur heard. Pulmonary:     Effort: Pulmonary effort is normal. No respiratory distress.     Breath sounds: Normal breath sounds. No wheezing, rhonchi or rales.  Chest:     Chest wall: Tenderness (Left upper chest wall tenderness near the midaxillary line, bruising there as well) present.  Musculoskeletal:        General: No tenderness. Normal range of motion.  Skin:    General: Skin is warm and dry.     Findings: No rash.  Neurological:     Mental Status: She is alert and oriented to person, place, and time.     Coordination: Coordination normal.  Psychiatric:        Behavior: Behavior normal.     Rib x-ray: Multiple left-sided rib fracture, looks like 4 or 5, await final read from radiology. Orbit x-ray: No bony abnormalities but await final read from radiology  Assessment & Plan:   Problem List Items Addressed This Visit       Other   Fall - Primary   Relevant Orders   DG Ribs Unilateral W/Chest Left   DG  Outside Films Head/Face   DG Orbits   For home use only DME Other see comment   Other Visit Diagnoses       Rib pain on left side  Relevant Orders   DG Ribs Unilateral W/Chest Left   DG Outside Films Head/Face   DG Orbits   For home use only DME Other see comment     Left-sided face pain       Relevant Orders   DG Ribs Unilateral W/Chest Left   DG Outside Films Head/Face   DG Orbits     Likely rib fracture, gave her a prescription for incentive spirometer that she can use to help prevent pneumonias  Multiple rib fractures, recommended conservative management, already has pain medicine and use incentive spirometry.  Do not see anything that I think is a facial fracture but await final read from radiology.  Follow up plan: Return if symptoms worsen or fail to improve.  Counseling provided for all of the vaccine components Orders Placed This Encounter  Procedures   For home use only DME Other see comment   DG Ribs Unilateral W/Chest Left   DG Outside Films Head/Face   DG Orbits    Arville Care, MD [facility] 05/08/2023, 10:20 AM

## 2023-05-12 ENCOUNTER — Telehealth: Payer: Self-pay

## 2023-05-12 DIAGNOSIS — M5106 Intervertebral disc disorders with myelopathy, lumbar region: Secondary | ICD-10-CM | POA: Diagnosis not present

## 2023-05-12 DIAGNOSIS — M4716 Other spondylosis with myelopathy, lumbar region: Secondary | ICD-10-CM | POA: Diagnosis not present

## 2023-05-12 DIAGNOSIS — I110 Hypertensive heart disease with heart failure: Secondary | ICD-10-CM | POA: Diagnosis not present

## 2023-05-12 DIAGNOSIS — I471 Supraventricular tachycardia, unspecified: Secondary | ICD-10-CM | POA: Diagnosis not present

## 2023-05-12 DIAGNOSIS — M47814 Spondylosis without myelopathy or radiculopathy, thoracic region: Secondary | ICD-10-CM | POA: Diagnosis not present

## 2023-05-12 DIAGNOSIS — I5020 Unspecified systolic (congestive) heart failure: Secondary | ICD-10-CM | POA: Diagnosis not present

## 2023-05-12 DIAGNOSIS — I2510 Atherosclerotic heart disease of native coronary artery without angina pectoris: Secondary | ICD-10-CM | POA: Diagnosis not present

## 2023-05-12 DIAGNOSIS — I080 Rheumatic disorders of both mitral and aortic valves: Secondary | ICD-10-CM | POA: Diagnosis not present

## 2023-05-12 DIAGNOSIS — G894 Chronic pain syndrome: Secondary | ICD-10-CM | POA: Diagnosis not present

## 2023-05-12 NOTE — Telephone Encounter (Signed)
 Copied from CRM [id number]. Topic: Clinical - Medical Advice >> May 12, 2023  2:13 PM Alessandra Bevels wrote: Reason for CRM: Mikeal Hawthorne Daughter [phone] is calling to report that the patient [facility] not [facility]. Marylene Land thought that Ashly did order one for the patient

## 2023-05-12 NOTE — Telephone Encounter (Signed)
 Left vm for cb

## 2023-05-14 DIAGNOSIS — I471 Supraventricular tachycardia, unspecified: Secondary | ICD-10-CM | POA: Diagnosis not present

## 2023-05-14 DIAGNOSIS — I5020 Unspecified systolic (congestive) heart failure: Secondary | ICD-10-CM | POA: Diagnosis not present

## 2023-05-14 DIAGNOSIS — M4716 Other spondylosis with myelopathy, lumbar region: Secondary | ICD-10-CM | POA: Diagnosis not present

## 2023-05-14 DIAGNOSIS — I2510 Atherosclerotic heart disease of native coronary artery without angina pectoris: Secondary | ICD-10-CM | POA: Diagnosis not present

## 2023-05-14 DIAGNOSIS — M5106 Intervertebral disc disorders with myelopathy, lumbar region: Secondary | ICD-10-CM | POA: Diagnosis not present

## 2023-05-14 DIAGNOSIS — M47814 Spondylosis without myelopathy or radiculopathy, thoracic region: Secondary | ICD-10-CM | POA: Diagnosis not present

## 2023-05-14 DIAGNOSIS — G894 Chronic pain syndrome: Secondary | ICD-10-CM | POA: Diagnosis not present

## 2023-05-14 DIAGNOSIS — I080 Rheumatic disorders of both mitral and aortic valves: Secondary | ICD-10-CM | POA: Diagnosis not present

## 2023-05-14 DIAGNOSIS — I110 Hypertensive heart disease with heart failure: Secondary | ICD-10-CM | POA: Diagnosis not present

## 2023-05-15 DIAGNOSIS — M47814 Spondylosis without myelopathy or radiculopathy, thoracic region: Secondary | ICD-10-CM | POA: Diagnosis not present

## 2023-05-15 DIAGNOSIS — M5106 Intervertebral disc disorders with myelopathy, lumbar region: Secondary | ICD-10-CM | POA: Diagnosis not present

## 2023-05-15 DIAGNOSIS — I080 Rheumatic disorders of both mitral and aortic valves: Secondary | ICD-10-CM | POA: Diagnosis not present

## 2023-05-15 DIAGNOSIS — I110 Hypertensive heart disease with heart failure: Secondary | ICD-10-CM | POA: Diagnosis not present

## 2023-05-15 DIAGNOSIS — G894 Chronic pain syndrome: Secondary | ICD-10-CM | POA: Diagnosis not present

## 2023-05-15 DIAGNOSIS — M4716 Other spondylosis with myelopathy, lumbar region: Secondary | ICD-10-CM | POA: Diagnosis not present

## 2023-05-15 DIAGNOSIS — I2510 Atherosclerotic heart disease of native coronary artery without angina pectoris: Secondary | ICD-10-CM | POA: Diagnosis not present

## 2023-05-15 DIAGNOSIS — I471 Supraventricular tachycardia, unspecified: Secondary | ICD-10-CM | POA: Diagnosis not present

## 2023-05-15 DIAGNOSIS — I5020 Unspecified systolic (congestive) heart failure: Secondary | ICD-10-CM | POA: Diagnosis not present

## 2023-05-16 ENCOUNTER — Other Ambulatory Visit: Payer: Self-pay | Admitting: Family Medicine

## 2023-05-16 DIAGNOSIS — F411 Generalized anxiety disorder: Secondary | ICD-10-CM

## 2023-05-16 NOTE — Telephone Encounter (Signed)
 Copied from CRM [id number]. Topic: Clinical - Medication Refill >> May 16, 2023  1:18 PM DeAngela L wrote: Most Recent Primary Care Visit:   Medication: MIRTAZAPINE 15 MG liSinopril 40 mg Atenolol 25 mg tablet  busPIRone (BUSPAR) 10 MG tablet   Has the patient contacted their pharmacy? Yes (Agent: If no, request that the patient contact the pharmacy for the refill. If patient does not wish to contact the pharmacy document the reason why and proceed with request.) (Agent: If yes, when and what did the pharmacy advise?)  Is this the correct pharmacy for this prescription? Yes  If no, delete pharmacy and type the correct one.  This is the patient's preferred pharmacy:   [facility] - [place] - [address] [place] [address] Phone: [phone] Fax: [phone]   Has the prescription been filled recently? Yes   Is the patient out of the medication? Yes for one, but has medication left for the other fills   Has the patient been seen for an appointment in the last year OR does the patient have an upcoming appointment? Yes   Can we respond through MyChart? No  Agent: Please be advised that Rx refills may take up to 3 business days. We ask that you follow-up with your pharmacy.

## 2023-05-22 ENCOUNTER — Other Ambulatory Visit: Payer: Self-pay | Admitting: Family Medicine

## 2023-05-22 DIAGNOSIS — I5020 Unspecified systolic (congestive) heart failure: Secondary | ICD-10-CM | POA: Diagnosis not present

## 2023-05-22 DIAGNOSIS — I471 Supraventricular tachycardia, unspecified: Secondary | ICD-10-CM | POA: Diagnosis not present

## 2023-05-22 DIAGNOSIS — G894 Chronic pain syndrome: Secondary | ICD-10-CM | POA: Diagnosis not present

## 2023-05-22 DIAGNOSIS — I110 Hypertensive heart disease with heart failure: Secondary | ICD-10-CM | POA: Diagnosis not present

## 2023-05-22 DIAGNOSIS — M4716 Other spondylosis with myelopathy, lumbar region: Secondary | ICD-10-CM | POA: Diagnosis not present

## 2023-05-22 DIAGNOSIS — M5106 Intervertebral disc disorders with myelopathy, lumbar region: Secondary | ICD-10-CM | POA: Diagnosis not present

## 2023-05-22 DIAGNOSIS — M47814 Spondylosis without myelopathy or radiculopathy, thoracic region: Secondary | ICD-10-CM | POA: Diagnosis not present

## 2023-05-22 DIAGNOSIS — I080 Rheumatic disorders of both mitral and aortic valves: Secondary | ICD-10-CM | POA: Diagnosis not present

## 2023-05-22 DIAGNOSIS — I2510 Atherosclerotic heart disease of native coronary artery without angina pectoris: Secondary | ICD-10-CM | POA: Diagnosis not present

## 2023-05-22 NOTE — Telephone Encounter (Unsigned)
 Copied from CRM [id number]. Topic: Clinical - Medication Refill >> May 22, 2023  9:57 AM Higinio Roger wrote: Most Recent Primary Care Visit:   Medication: traZODone (DESYREL) 50 MG tablet   Has the patient contacted their pharmacy? No (Agent: If no, request that the patient contact the pharmacy for the refill. If patient does not wish to contact the pharmacy document the reason why and proceed with request.) (Agent: If yes, when and what did the pharmacy advise?)  Is this the correct pharmacy for this prescription? Yes If no, delete pharmacy and type the correct one.  This is the patient's preferred pharmacy:   [id number] [place] [place] - [address] [place] [address] Phone: [phone] Fax: [phone]   Has the prescription been filled recently? Yes  Is the patient out of the medication? Yes  Has the patient been seen for an appointment in the last year OR does the patient have an upcoming appointment? Yes  Can we respond through MyChart? Yes  Agent: Please be advised that Rx refills may take up to 3 business days. We ask that you follow-up with your pharmacy.

## 2023-05-23 NOTE — Telephone Encounter (Signed)
 This was previous advised AGAINST in 04/22/23. Not sure how we can get them to stop requesting This has HIGH risk of arrhythmia with her Zoloft. It is NOT an appropriate med for her to continue

## 2023-05-26 ENCOUNTER — Encounter: Payer: Self-pay | Admitting: Nurse Practitioner

## 2023-05-26 ENCOUNTER — Ambulatory Visit (INDEPENDENT_AMBULATORY_CARE_PROVIDER_SITE_OTHER): Admitting: Nurse Practitioner

## 2023-05-26 ENCOUNTER — Other Ambulatory Visit: Payer: Self-pay | Admitting: Family Medicine

## 2023-05-26 ENCOUNTER — Ambulatory Visit: Payer: Self-pay

## 2023-05-26 VITALS — BP 158/89 | HR 110 | Temp 97.3°F | Ht 62.0 in | Wt 116.0 lb

## 2023-05-26 DIAGNOSIS — K625 Hemorrhage of anus and rectum: Secondary | ICD-10-CM | POA: Diagnosis not present

## 2023-05-26 DIAGNOSIS — F411 Generalized anxiety disorder: Secondary | ICD-10-CM

## 2023-05-26 LAB — CBC WITH DIFFERENTIAL/PLATELET
Basophils Absolute: 0.1 10*3/uL (ref 0.0–0.2)
Basos: 1 %
EOS (ABSOLUTE): 0.3 10*3/uL (ref 0.0–0.4)
Eos: 3 %
Hematocrit: 37.7 % (ref 34.0–46.6)
Hemoglobin: 12.6 g/dL (ref 11.1–15.9)
Immature Grans (Abs): 0 10*3/uL (ref 0.0–0.1)
Immature Granulocytes: 0 %
Lymphocytes Absolute: 1.1 10*3/uL (ref 0.7–3.1)
Lymphs: 13 %
MCH: 30.6 pg (ref 26.6–33.0)
MCHC: 33.4 g/dL (ref 31.5–35.7)
MCV: 92 fL (ref 79–97)
Monocytes Absolute: 0.6 10*3/uL (ref 0.1–0.9)
Monocytes: 8 %
Neutrophils Absolute: 6.3 10*3/uL (ref 1.4–7.0)
Neutrophils: 75 %
Platelets: 280 10*3/uL (ref 150–450)
RBC: 4.12 x10E6/uL (ref 3.77–5.28)
RDW: 11.9 % (ref 11.7–15.4)
WBC: 8.4 10*3/uL (ref 3.4–10.8)

## 2023-05-26 NOTE — Patient Instructions (Signed)
Rectal Bleeding  Rectal bleeding is when blood passes out of the opening of the butt (anus). If you have rectal bleeding, you may see bright red blood in your underwear or in the toilet after you poop. You may also have blood mixed with your poop (stool) or dark red or black poop. Rectal bleeding is often a sign that something is wrong. Causes of rectal bleeding include: Diverticulosis. This is when sacs or pockets form in the large intestine (colon). Hemorrhoids. These are blood vessels around the anus or inside the rectum that are larger than normal. Anal fissures. This is a tear in the anus. Proctitis and colitis. These are conditions that happen when the rectum, colon, or anus becomes inflamed. Polyps. These are growths. They may be cancer (malignant) or not cancer (benign). Infections of the intestines. Fistulas. These are abnormal openings in the rectum and anus. Rectal prolapse. This is when a part of the rectum sticks out from the anus. Follow these instructions at home: Medicines Take over-the-counter and prescription medicines only as told by your health care provider. Ask your provider about changing or stopping your regular medicines. These include any diabetes medicine or blood thinners you take. Medicines that thin the blood can make rectal bleeding worse. Managing constipation Your condition may cause constipation. To prevent or treat constipation, or to help make your poop soft, you may need to: Drink enough fluid to keep your pee (urine) pale yellow. Take over-the-counter or prescription medicines. Eat foods that are high in fiber, such as beans, whole grains, and fresh fruits and vegetables. Ask your provider if you need a fiber supplement. Limit foods that are high in fat and processed sugars, such as fried or sweet foods.  General instructions Try not to strain when you poop. Take a warm bath. This may help to soothe pain in your rectum. Watch for any changes in your  symptoms. Contact a health care provider if: You have pain or tenderness in your abdomen. You have a fever. You feel weak or nauseous. You cannot poop. You have new or more rectal bleeding. You have black or dark red poop. You vomit, and the vomit has blood or something that looks like coffee grounds in it. Get help right away if: You faint. You have severe pain in your rectum. These symptoms may be an emergency. Get help right away. Call 911. Do not wait to see if the symptoms will go away. Do not drive yourself to the hospital. This information is not intended to replace advice given to you by your health care provider. Make sure you discuss any questions you have with your health care provider. Document Revised: 10/02/2021 Document Reviewed: 10/02/2021 Elsevier Patient Education  2024 [employer].

## 2023-05-26 NOTE — Progress Notes (Signed)
 Subjective:    Patient ID: Kaitlyn Serrano, female    DOB: 09-26-1940, 83 y.o.   MRN: [id number]   Chief Complaint: Blood In Stools   HPI  Patient comes in today accompanied by her sister and cousin. She states that she has had bright red blood in her stools. Just recently started. Sees blood in the toilet. Denies constipation or straining. Does not have hemorrhoids that she is aware of. Has slight abdominal pain. She says she is sure it is coming from her rectum. Patient Active Problem List   Diagnosis Date Noted   Anxiety and depression 04/10/2023   GERD without esophagitis 04/10/2023   Dyslipidemia 04/10/2023   Essential hypertension 04/10/2023   Coronary artery disease 04/10/2023   Syncope and collapse 04/09/2023   Mild CAD 04/07/2023   Non-ST elevation (NSTEMI) myocardial infarction (HCC) 04/03/2023   Acute systolic heart failure (HCC) 04/03/2023   Near syncope 04/02/2023   AKI (acute kidney injury) (HCC) 04/02/2023   QT prolongation 04/02/2023   Elevated troponin 04/02/2023   Chest pain 04/01/2023   Acute head injury 09/19/2022   Acute pain of left knee 09/19/2022   Fall 09/19/2022   Acute nonintractable headache 09/19/2022   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   Moderate episode of recurrent major depressive disorder (HCC) 03/27/2020   Sinus congestion 03/27/2020   S/P right THA, 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  Eyes:  Negative for pain.   Respiratory:  Negative for shortness of breath.   Cardiovascular:  Negative for chest pain, palpitations and leg swelling.  Gastrointestinal:  Negative for abdominal pain.  Endocrine: Negative for polydipsia.  Skin:  Negative for rash.  Neurological:  Negative for dizziness, weakness and headaches.  Hematological:  Does not bruise/bleed easily.  All other systems reviewed and are negative.      Objective:   Physical Exam Constitutional:      Appearance: Normal appearance.  Cardiovascular:     Rate and Rhythm: Normal rate and regular rhythm.     Heart sounds: Normal heart sounds.  Pulmonary:     Breath sounds: Normal breath sounds.  Abdominal:     General: Abdomen is flat. Bowel sounds are normal.     Palpations: Abdomen is soft.     Comments: Moderate amont of dry blood around rectal area with spote n her depends. Small external hemorrhoid noted.  Neurological:     General: No focal deficit present.     Mental Status: She is alert and oriented to person, place, and time.  Psychiatric:        Mood and Affect: Mood normal.        Behavior: Behavior normal.     BP (!) 158/89   Pulse (!) 110   Temp (!) 97.3 F (36.3 C) (Temporal)   Ht 5\' 2"  (1.575 m)   Wt 116 lb (52.6 kg)   SpO2 94%   BMI 21.22 kg/m         Assessment & Plan:  Kaitlyn Serrano in today with chief complaint of Blood In Stools   1. Rectal bleeding (Primary) Continue to watch the blood Will call with results - Ambulatory referral to Gastroenterology - CBC with Differential/Platelet    The above assessment and management plan was discussed with the patient. The patient verbalized understanding of and has agreed to the management plan. Patient is aware to call the clinic if symptoms persist or worsen. Patient is aware when to return to the clinic for a follow-up visit. Patient educated on when it is appropriate to go to the emergency department.   Mary-Margaret Daphine Deutscher, FNP

## 2023-05-26 NOTE — Telephone Encounter (Signed)
 Copied from CRM [id number]. Topic: Clinical - Medical Advice >> May 26, 2023  8:07 AM Patsy Lager T wrote: Reason for CRM: patients daughter Kaitlyn Serrano has been bleeding from her rectum for 2 days. She has the urgency to make a bowel movement but its only blood.  Chief Complaint: rectal bleeding Symptoms: dark red blood that fills toilet/changes color in water, large amounts about every 8 hours, moderate abd pain Frequency: x 2 days Pertinent Negatives: Patient denies n/v, weakness Disposition: [] ED /[] Urgent Care (no appt availability in office) / [x] Appointment(In office/virtual)/ []  [facility]/ [] Home Care/ [] Refused Recommended Disposition /[] [facility]/ []  Follow-up with PCP Additional Notes: pt goes to bathroom and only blood no stool comes out.  Daughter states that pt has been having constipation lately  Reason for Disposition  MODERATE rectal bleeding (small blood clots, passing blood without stool, or toilet water turns red)  Answer Assessment - Initial Assessment Questions 1. APPEARANCE of BLOOD: "What color is it?" "Is it passed separately, on the surface of the stool, or mixed in with the stool?"      Dark red blood 2. AMOUNT: "How much blood was passed?"      A lot of blood 3. FREQUENCY: "How many times has blood been passed with the stools?"      Occurs about every 8 hours 4. ONSET: "When was the blood first seen in the stools?" (Days or weeks)      X 2 days 5. DIARRHEA: "Is there also some diarrhea?" If Yes, ask: "How many diarrhea stools in the past 24 hours?"      no 6. CONSTIPATION: "Do you have constipation?" If Yes, ask: "How bad is it?"     yes 7. RECURRENT SYMPTOMS: "Have you had blood in your stools before?" If Yes, ask: "When was the last time?" and "What happened that time?"      Yes - but was a while ago 8. BLOOD THINNERS: "Do you take any blood thinners?" (e.g., Coumadin/warfarin, Pradaxa/dabigatran, aspirin)     N/a 9. OTHER SYMPTOMS: "Do you  have any other symptoms?"  (e.g., abdomen pain, vomiting, dizziness, fever)     Abd pain - moderate  10. PREGNANCY: "Is there any chance you are pregnant?" "When was your last menstrual period?"       N/a  Protocols used: Rectal Bleeding-A-AH

## 2023-05-27 ENCOUNTER — Telehealth: Payer: Self-pay

## 2023-05-27 NOTE — Telephone Encounter (Signed)
 Copied from CRM [id number]. Topic: Clinical - Lab/Test Results >> May 27, 2023 12:40 PM Turkey B wrote: Reason for CRM: pt's daughter called in for test results, the notes are not in yet , Please cb pt when available

## 2023-05-28 ENCOUNTER — Ambulatory Visit: Payer: Medicare HMO | Admitting: Family Medicine

## 2023-05-28 ENCOUNTER — Telehealth: Payer: Self-pay | Admitting: *Deleted

## 2023-05-28 ENCOUNTER — Ambulatory Visit: Payer: Self-pay

## 2023-05-28 ENCOUNTER — Encounter: Payer: Self-pay | Admitting: Family Medicine

## 2023-05-28 ENCOUNTER — Telehealth: Payer: Self-pay | Admitting: Family Medicine

## 2023-05-28 DIAGNOSIS — M47814 Spondylosis without myelopathy or radiculopathy, thoracic region: Secondary | ICD-10-CM | POA: Diagnosis not present

## 2023-05-28 DIAGNOSIS — M5106 Intervertebral disc disorders with myelopathy, lumbar region: Secondary | ICD-10-CM | POA: Diagnosis not present

## 2023-05-28 DIAGNOSIS — I471 Supraventricular tachycardia, unspecified: Secondary | ICD-10-CM | POA: Diagnosis not present

## 2023-05-28 DIAGNOSIS — G894 Chronic pain syndrome: Secondary | ICD-10-CM | POA: Diagnosis not present

## 2023-05-28 DIAGNOSIS — I2510 Atherosclerotic heart disease of native coronary artery without angina pectoris: Secondary | ICD-10-CM | POA: Diagnosis not present

## 2023-05-28 DIAGNOSIS — I080 Rheumatic disorders of both mitral and aortic valves: Secondary | ICD-10-CM | POA: Diagnosis not present

## 2023-05-28 DIAGNOSIS — I5020 Unspecified systolic (congestive) heart failure: Secondary | ICD-10-CM | POA: Diagnosis not present

## 2023-05-28 DIAGNOSIS — I110 Hypertensive heart disease with heart failure: Secondary | ICD-10-CM | POA: Diagnosis not present

## 2023-05-28 DIAGNOSIS — M4716 Other spondylosis with myelopathy, lumbar region: Secondary | ICD-10-CM | POA: Diagnosis not present

## 2023-05-28 NOTE — Telephone Encounter (Signed)
 Attempted to call number back - number does not have vm set up

## 2023-05-28 NOTE — Telephone Encounter (Addendum)
 TC from [place] [facility] w/ [facility] FYI they had notes to call when weight went down, she was 121 last week, today was 116 lb. She also had a fall last week. Was doing great last week, was walking around the block now feels like her legs are numb and needs to use her walker. This is their last visit

## 2023-05-28 NOTE — Telephone Encounter (Signed)
 Copied from CRM [id number]. Topic: Clinical - Prescription Issue >> May 28, 2023 10:41 AM Pierre Bali B wrote: Reason for CRM: pt daughter called in to see if a nurse can give her a call about one of her mothers medication. She lost her pill bottle (did not say which one is was) and was wondering if she can get a 5 day suplly to hold her over

## 2023-05-28 NOTE — Telephone Encounter (Signed)
 I think there may be concern for malignancy/ GI bleed based on MM 05/26/2023 note and referral. If patient is declining, I'd like to see her soon as well. Please offer either DOD next week or a same day appt slot with me in the next few weeks.

## 2023-05-28 NOTE — Telephone Encounter (Signed)
 Patient's daughter, Marylene Land, called to ask about the patient's lab results. This RN advised that lab results all came back normal. Marylene Land asked if Dr. Nadine Counts can extend patient's physical therapy orders so the patient can receive further PT. Angela's call back number is [phone].   Copied from CRM [id number]. Topic: Clinical - Lab/Test Results >> May 28, 2023 10:03 AM Higinio Roger wrote: Reason for CRM: Patient's daughter, Rowe Clack has further questions about her mother's lab. Patient is still bleeding from stool. Reason for Disposition  Caller requesting routine or non-urgent lab result  Answer Assessment - Initial Assessment Questions 1. REASON FOR CALL or QUESTION: "What is your reason for calling today?" or "How can I best help you?" or "What question do you have that I can help answer?"     Please see notes 2. CALLER: Document the source of call. (e.g., laboratory, patient).     Patient's daughter  Protocols used: PCP Call - No Triage-A-AH

## 2023-05-28 NOTE — Telephone Encounter (Signed)
 Appt made for Tuesday 06/03/23 at 220

## 2023-05-28 NOTE — Telephone Encounter (Signed)
 Yes okay to give verbal to extend physical therapy.  Her CBC appears to be normal.  No evidence of anemia at this time

## 2023-05-29 NOTE — Telephone Encounter (Signed)
 Aware of lab work results and that I am waiting a call back from the physical therapist if we can ext PT or if a new order needs to be placed which can be done on Tuesday at her appt. She still had questions about her bleeding when she goes to the bathroom, sometimes it starts out bright read then is dark as times too. It has been going on for 2 weeks now, will continue to watch this until her appt. If she develops any clots or anything else instructed her to go on to the ED, but otherwise this has not changed in the last 2 weeks.

## 2023-05-29 NOTE — Telephone Encounter (Signed)
 Lmtcb, did leave Maurine Minister PT w/ [facility] a message about extending PT, or if he needed a new order.

## 2023-06-01 ENCOUNTER — Other Ambulatory Visit: Payer: Self-pay | Admitting: Family Medicine

## 2023-06-01 DIAGNOSIS — F411 Generalized anxiety disorder: Secondary | ICD-10-CM

## 2023-06-01 DIAGNOSIS — F331 Major depressive disorder, recurrent, moderate: Secondary | ICD-10-CM

## 2023-06-03 ENCOUNTER — Encounter: Payer: Self-pay | Admitting: Family Medicine

## 2023-06-03 ENCOUNTER — Ambulatory Visit: Admitting: Family Medicine

## 2023-06-05 DIAGNOSIS — M5106 Intervertebral disc disorders with myelopathy, lumbar region: Secondary | ICD-10-CM | POA: Diagnosis not present

## 2023-06-05 DIAGNOSIS — I2510 Atherosclerotic heart disease of native coronary artery without angina pectoris: Secondary | ICD-10-CM | POA: Diagnosis not present

## 2023-06-05 DIAGNOSIS — I080 Rheumatic disorders of both mitral and aortic valves: Secondary | ICD-10-CM | POA: Diagnosis not present

## 2023-06-05 DIAGNOSIS — M4716 Other spondylosis with myelopathy, lumbar region: Secondary | ICD-10-CM | POA: Diagnosis not present

## 2023-06-05 DIAGNOSIS — I110 Hypertensive heart disease with heart failure: Secondary | ICD-10-CM | POA: Diagnosis not present

## 2023-06-05 DIAGNOSIS — I471 Supraventricular tachycardia, unspecified: Secondary | ICD-10-CM | POA: Diagnosis not present

## 2023-06-05 DIAGNOSIS — G894 Chronic pain syndrome: Secondary | ICD-10-CM | POA: Diagnosis not present

## 2023-06-05 DIAGNOSIS — M4326 Fusion of spine, lumbar region: Secondary | ICD-10-CM | POA: Diagnosis not present

## 2023-06-05 DIAGNOSIS — I5020 Unspecified systolic (congestive) heart failure: Secondary | ICD-10-CM | POA: Diagnosis not present

## 2023-06-05 DIAGNOSIS — M47814 Spondylosis without myelopathy or radiculopathy, thoracic region: Secondary | ICD-10-CM | POA: Diagnosis not present

## 2023-06-10 ENCOUNTER — Other Ambulatory Visit: Payer: Self-pay | Admitting: Family Medicine

## 2023-06-10 NOTE — Telephone Encounter (Signed)
 Copied from CRM [id number]. Topic: Clinical - Medication Refill >> Jun 10, 2023  5:38 PM Phil Braun wrote: Most Recent Primary Care Visit:   Medication: traZODone (DESYREL) 50 MG tablet  Has the patient contacted their pharmacy? Yes   Is this the correct pharmacy for this prescription? Yes This is the patient's preferred pharmacy:   [id number] - [place] [place] - [address] [place] [address] Phone: [phone] Fax: [phone]   Has the prescription been filled recently? No  Is the patient out of the medication? Yes  Has the patient been seen for an appointment in the last year OR does the patient have an upcoming appointment? Yes  Can we respond through MyChart? No  Agent: Please be advised that Rx refills may take up to 3 business days. We ask that you follow-up with your pharmacy.

## 2023-06-11 ENCOUNTER — Other Ambulatory Visit: Payer: Self-pay | Admitting: Family Medicine

## 2023-06-11 ENCOUNTER — Encounter: Payer: Self-pay | Admitting: Family Medicine

## 2023-06-11 ENCOUNTER — Ambulatory Visit (INDEPENDENT_AMBULATORY_CARE_PROVIDER_SITE_OTHER)

## 2023-06-11 DIAGNOSIS — F5101 Primary insomnia: Secondary | ICD-10-CM

## 2023-06-11 MED ORDER — MELATONIN ER 5 MG PO TBCR: 5.0000 mg | EXTENDED_RELEASE_TABLET | Freq: Every evening | ORAL | 4 refills | Status: DC | PRN

## 2023-06-11 NOTE — Telephone Encounter (Signed)
 NA/NVM to inform pt why this can nto be refilled.

## 2023-06-11 NOTE — Telephone Encounter (Signed)
 Aware why this is not able to be refilled Wanting to know if there is something else that can be prescribed to help her sleep, she is not getting any sleep? Please advise

## 2023-06-11 NOTE — Telephone Encounter (Signed)
 I have no idea how we can get these requests to STOP coming to us .  I have addressed this med repeatedly since feb.  She should NOT be on trazodone period.   Signed      Trazodone NOT appropriate given concerns for cardiac etiology of recent syncope. This increases risk of heart beat abnormality, especially with the Zoloft.

## 2023-06-11 NOTE — Telephone Encounter (Signed)
 Aware what was sent in and other information.

## 2023-06-11 NOTE — Telephone Encounter (Signed)
 I've sent melatonin 5mg  for her. Sadly, all other rx meds are too high risk for her heart/ are not recommended in persons over 83 years of age. Her insurance won't cover ramelteon.

## 2023-06-26 ENCOUNTER — Ambulatory Visit: Admitting: Nurse Practitioner

## 2023-07-02 ENCOUNTER — Ambulatory Visit: Payer: Self-pay

## 2023-07-02 NOTE — Telephone Encounter (Signed)
 Kaitlyn Serrano(DPR) has called back and is requesting appointment for mother in am. Per triage call- advised UC/4 hour protocol. Kaitlyn Serrano states EMS dressed wounds and she does not have transportation for today- they would like to bring her to the office for appointment tomorrow. Call to office- Kaitlyn Serrano will schedule the appointment- call transferred.

## 2023-07-02 NOTE — Telephone Encounter (Signed)
 Copied from CRM [id number]. Topic: Clinical - Red Word Triage >> Jul 02, 2023  2:17 PM Rachelle R wrote: [facility] that prompted transfer to Nurse Triage: Patient had fall last night, didn't think it was that bad but woke up this morning and was bleeding. Daughter Shelvy Dickens called the Ambulance and they wanted to take her to the hospital but patient declined. EMS wrapped her wounds this morning and said to follow up with PCP.  Patient has 2 open wounds, both on her left arm, 1 near elbow and other on forearm. Patient is in a little pain.  Chief Complaint: fall resulting in injury to forearm and elbow on left arm Symptoms: pain Frequency: happened last night Pertinent Negatives: Patient denies hitting head, loc, cp, sob Disposition: [] ED /[x] Urgent Care (no appt availability in office) / [] Appointment(In office/virtual)/ []  [facility]/ [] Home Care/ [] Refused Recommended Disposition /[] [facility]/ []  Follow-up with PCP Additional Notes: declined ER this am when EMS was called out for fall that happened yesterday; instructed to go to UC.  Care advice given, denies questions; instructed to go to ER if becomes worse.   Reason for Disposition  [1] MODERATE weakness (i.e., interferes with work, school, normal activities) AND [2] new-onset or worsening  Answer Assessment - Initial Assessment Questions 1. MECHANISM: "How did the fall happen?"     Fell last night, trying to rush out of the bathtub and slipped and fell 2. DOMESTIC VIOLENCE AND ELDER ABUSE SCREENING: "Did you fall because someone pushed you or tried to hurt you?" If Yes, ask: "Are you safe now?"     denies 3. ONSET: "When did the fall happen?" (e.g., minutes, hours, or days ago)     Last night 4. LOCATION: "What part of the body hit the ground?" (e.g., back, buttocks, head, hips, knees, hands, head, stomach)     Left arm open wounds, refused ER this am 5. INJURY: "Did you hurt (injure) yourself when you fell?" If Yes,  ask: "What did you injure? Tell me more about this?" (e.g., body area; type of injury; pain severity)"     Left arm 6. PAIN: "Is there any pain?" If Yes, ask: "How bad is the pain?" (e.g., Scale 1-10; or mild,  moderate, severe)   - NONE (0): No pain   - MILD (1-3): Doesn't interfere with normal activities    - MODERATE (4-7): Interferes with normal activities or awakens from sleep    - SEVERE (8-10): Excruciating pain, unable to do any normal activities      mild 7. SIZE: For cuts, bruises, or swelling, ask: "How large is it?" (e.g., inches or centimeters)      On forearm and elbow 8. PREGNANCY: "Is there any chance you are pregnant?" "When was your last menstrual period?"     na 9. OTHER SYMPTOMS: "Do you have any other symptoms?" (e.g., dizziness, fever, weakness; new onset or worsening).      weakness 10. CAUSE: "What do you think caused the fall (or falling)?" (e.g., tripped, dizzy spell)       Slipped.  Protocols used: Falls and [facility]

## 2023-07-03 ENCOUNTER — Encounter: Payer: Self-pay | Admitting: Family Medicine

## 2023-07-03 ENCOUNTER — Ambulatory Visit

## 2023-07-03 ENCOUNTER — Ambulatory Visit (INDEPENDENT_AMBULATORY_CARE_PROVIDER_SITE_OTHER)

## 2023-07-03 ENCOUNTER — Other Ambulatory Visit: Payer: Self-pay | Admitting: Family Medicine

## 2023-07-03 VITALS — BP 188/89 | HR 73 | Temp 97.9°F | Ht 62.0 in | Wt 120.6 lb

## 2023-07-03 DIAGNOSIS — I10 Essential (primary) hypertension: Secondary | ICD-10-CM | POA: Diagnosis not present

## 2023-07-03 DIAGNOSIS — M79632 Pain in left forearm: Secondary | ICD-10-CM

## 2023-07-03 DIAGNOSIS — W19XXXA Unspecified fall, initial encounter: Secondary | ICD-10-CM | POA: Diagnosis not present

## 2023-07-03 DIAGNOSIS — S51812A Laceration without foreign body of left forearm, initial encounter: Secondary | ICD-10-CM

## 2023-07-03 DIAGNOSIS — M25522 Pain in left elbow: Secondary | ICD-10-CM | POA: Diagnosis not present

## 2023-07-03 DIAGNOSIS — M25422 Effusion, left elbow: Secondary | ICD-10-CM | POA: Diagnosis not present

## 2023-07-03 DIAGNOSIS — F03B11 Unspecified dementia, moderate, with agitation: Secondary | ICD-10-CM

## 2023-07-03 NOTE — Progress Notes (Signed)
   Acute Office Visit  Subjective:     Patient ID: Kaitlyn Serrano, female    DOB: 14-Feb-1941, 83 y.o.   MRN: [id number]  Chief Complaint  Patient presents with   Fall    Fall The accident occurred 12 to 24 hours ago. The fall occurred while standing. She landed on Hard floor. The point of impact was the left elbow (left arm). The pain is present in the left lower arm and left elbow. The symptoms are aggravated by pressure on injury (palpation). Pertinent negatives include no numbness or tingling.  Denies LOC or head injury. She was cleaning her bathroom when the fall occurred. She had a skin tear to her left elbow and forearm. EMS was called following fall yesterday to check her over. EMS applied an ABD dressing to skin tears.   Review of Systems  Neurological:  Negative for tingling and numbness.        Objective:    BP (!) 188/89   Pulse 73   Temp 97.9 F (36.6 C) (Temporal)   Ht 5\' 2"  (1.575 m)   Wt 120 lb 9.6 oz (54.7 kg)   SpO2 97%   BMI 22.06 kg/m  BP Readings from Last 3 Encounters:  07/03/23 (!) 188/89  05/26/23 (!) 158/89  05/08/23 (!) 189/82      Physical Exam Vitals and nursing note reviewed.  Constitutional:      General: She is not in acute distress.    Appearance: She is not ill-appearing, toxic-appearing or diaphoretic.  Cardiovascular:     Rate and Rhythm: Normal rate and regular rhythm.     Heart sounds: Normal heart sounds. No murmur heard. Pulmonary:     Effort: Pulmonary effort is normal. No respiratory distress.  Musculoskeletal:     Comments: Skin tear x2 on proximal left forearm. No signs of infection. Full ROM of left elbow. No swelling or edema of left arm or elbow. Tenderness to olecranon process.   Skin:    General: Skin is warm and dry.  Neurological:     Mental Status: She is alert and oriented to person, place, and time. Mental status is at baseline.  Psychiatric:        Mood and Affect: Mood normal.        Behavior: Behavior  normal.     No results found for any visits on 07/03/23.      Assessment & Plan:   Chantee was seen today for fall.  Diagnoses and all orders for this visit:  Fall, initial encounter -     DG Elbow 2 Views Left; Future  Left elbow pain -     DG Elbow 2 Views Left; Future  Left forearm pain -     DG Elbow 2 Views Left; Future  Skin tear of forearm without complication, left, initial encounter  Primary hypertension   Xray today with no fractures. Agree with radiology read. Skin tears cleansed and dressed with Tegaderm. Discussed home wound care and to follow up for s/s of infection. RICE therapy, patient is on chronic opioids for pain management. BP is elevated today- asymptomatic. Monitor at home and notify for elevated readings.   The patient indicates understanding of these issues and agrees with the plan.  Albertha Huger, FNP

## 2023-07-04 NOTE — Telephone Encounter (Signed)
 A month old will close

## 2023-07-07 ENCOUNTER — Other Ambulatory Visit: Payer: Self-pay | Admitting: Family Medicine

## 2023-07-07 DIAGNOSIS — F411 Generalized anxiety disorder: Secondary | ICD-10-CM

## 2023-07-07 MED ORDER — BUSPIRONE HCL 10 MG PO TABS
10.0000 mg | ORAL_TABLET | Freq: Three times a day (TID) | ORAL | 0 refills | Status: AC
Start: 2023-07-07 — End: ?

## 2023-07-07 NOTE — Telephone Encounter (Signed)
 Last Fill: Buspar : 05/02/23 270 tabs/0 RF    Trazodone : 04/10/23-discontinued  Last OV: 06/11/23 Next OV: 07/14/23 AWV  Routing to provider for review/authorization.

## 2023-07-07 NOTE — Telephone Encounter (Signed)
 Copied from CRM [id number]. Topic: Clinical - Medication Refill >> Jul 07, 2023 11:15 AM Brynn Caras wrote: Medication: busPIRone  (BUSPAR ) 10 MG tablet and traZODone  (DESYREL ) 50 MG tablet - it shows the trazodone  has been discontinued as of 04/16 due to the patient having heart issues.    Has the patient contacted their pharmacy? No (Agent: If no, request that the patient contact the pharmacy for the refill. If patient does not wish to contact the pharmacy document the reason why and proceed with request.) (Agent: If yes, when and what did the pharmacy advise?)  This is the patient's preferred pharmacy:   [id number] - [place] [place] - [address] [place] [address] Phone: [phone] Fax: [phone]   Is this the correct pharmacy for this prescription? Yes If no, delete pharmacy and type the correct one.   Has the prescription been filled recently? No  Is the patient out of the medication? Yes  Has the patient been seen for an appointment in the last year OR does the patient have an upcoming appointment? Yes  Can we respond through MyChart? No  Agent: Please be advised that Rx refills may take up to 3 business days. We ask that you follow-up with your pharmacy.

## 2023-07-08 ENCOUNTER — Telehealth: Payer: Self-pay

## 2023-07-08 NOTE — Telephone Encounter (Signed)
 Copied from CRM [id number]. Topic: General - Other >> Jul 08, 2023 12:04 PM Juluis Ok wrote: Reason for CRM: Patient's daughter, Shelvy Dickens, requesting a callback from provider/nurse. No additional details provided.  Callback # [phone]

## 2023-07-08 NOTE — Telephone Encounter (Signed)
Lmtcb ? ?Need more information  ?

## 2023-07-10 NOTE — Telephone Encounter (Signed)
 Spoke with pts daughter , no further questions

## 2023-07-14 ENCOUNTER — Encounter: Payer: Self-pay | Admitting: Nurse Practitioner

## 2023-07-14 ENCOUNTER — Ambulatory Visit: Payer: Self-pay

## 2023-07-14 ENCOUNTER — Ambulatory Visit: Admitting: Nurse Practitioner

## 2023-07-14 VITALS — BP 190/85 | HR 85 | Temp 97.6°F | Ht 62.0 in | Wt 118.0 lb

## 2023-07-14 DIAGNOSIS — F03B11 Unspecified dementia, moderate, with agitation: Secondary | ICD-10-CM

## 2023-07-14 DIAGNOSIS — F411 Generalized anxiety disorder: Secondary | ICD-10-CM

## 2023-07-14 DIAGNOSIS — K219 Gastro-esophageal reflux disease without esophagitis: Secondary | ICD-10-CM

## 2023-07-14 DIAGNOSIS — F331 Major depressive disorder, recurrent, moderate: Secondary | ICD-10-CM | POA: Diagnosis not present

## 2023-07-14 DIAGNOSIS — S51002D Unspecified open wound of left elbow, subsequent encounter: Secondary | ICD-10-CM | POA: Diagnosis not present

## 2023-07-14 DIAGNOSIS — F5101 Primary insomnia: Secondary | ICD-10-CM

## 2023-07-14 MED ORDER — EZETIMIBE 10 MG PO TABS: 10.0000 mg | ORAL_TABLET | Freq: Every day | ORAL | 0 refills | Status: DC

## 2023-07-14 MED ORDER — DONEPEZIL HCL 10 MG PO TABS: ORAL_TABLET | ORAL | 0 refills | Status: DC

## 2023-07-14 MED ORDER — SULFAMETHOXAZOLE-TRIMETHOPRIM 800-160 MG PO TABS: 1.0000 | ORAL_TABLET | Freq: Two times a day (BID) | ORAL | 0 refills | Status: DC

## 2023-07-14 MED ORDER — METOPROLOL SUCCINATE ER 25 MG PO TB24: 25.0000 mg | ORAL_TABLET | Freq: Every day | ORAL | 3 refills | Status: AC

## 2023-07-14 MED ORDER — LOSARTAN POTASSIUM 100 MG PO TABS: 100.0000 mg | ORAL_TABLET | Freq: Every day | ORAL | 0 refills | Status: DC

## 2023-07-14 MED ORDER — MELATONIN ER 5 MG PO TBCR: 5.0000 mg | EXTENDED_RELEASE_TABLET | Freq: Every evening | ORAL | 4 refills | Status: DC | PRN

## 2023-07-14 MED ORDER — SERTRALINE HCL 50 MG PO TABS: 50.0000 mg | ORAL_TABLET | Freq: Every day | ORAL | 0 refills | Status: DC

## 2023-07-14 MED ORDER — PANTOPRAZOLE SODIUM 40 MG PO TBEC: 40.0000 mg | DELAYED_RELEASE_TABLET | Freq: Two times a day (BID) | ORAL | 3 refills | Status: DC

## 2023-07-14 NOTE — Progress Notes (Signed)
 This encounter was created in error - please disregard.  attempted to call pt at all the numbers listed, N/A/NO VM can't leave VM---07/13/24 [facility] t/cma

## 2023-07-14 NOTE — Progress Notes (Signed)
 Subjective:    Patient ID: Kaitlyn Serrano, female    DOB: [birth year], 83 y.o.   MRN: [id number]   Chief Complaint: left elbow wound   HPI  Patient fell and injured her left elbow. Was seen one week ago and she had no fracture. Yesterday the elbow wound started draining greenish excudate.  Patient has miss placed all of her meds. Family can't find them. Patient Active Problem List   Diagnosis Date Noted   Anxiety and depression 04/10/2023   GERD without esophagitis 04/10/2023   Dyslipidemia 04/10/2023   Essential hypertension 04/10/2023   Coronary artery disease 04/10/2023   Syncope and collapse 04/09/2023   Mild CAD 04/07/2023   Non-ST elevation (NSTEMI) myocardial infarction (HCC) 04/03/2023   Acute systolic heart failure (HCC) 04/03/2023   Near syncope 04/02/2023   AKI (acute kidney injury) (HCC) 04/02/2023   QT prolongation 04/02/2023   Elevated troponin 04/02/2023   Chest pain 04/01/2023   Acute head injury 09/19/2022   Acute pain of left knee 09/19/2022   Fall 09/19/2022   Acute nonintractable headache 09/19/2022   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   Moderate episode of recurrent major depressive disorder (HCC) 03/27/2020   Sinus congestion 03/27/2020   S/P right THA, 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  Eyes:  Negative for pain.  Respiratory:  Negative for shortness of breath.   Cardiovascular:  Negative for chest pain, palpitations and leg  swelling.  Gastrointestinal:  Negative for abdominal pain.  Endocrine: Negative for polydipsia.  Skin:  Negative for rash.  Neurological:  Negative for dizziness, weakness and headaches.  Hematological:  Does not bruise/bleed easily.  All other systems reviewed and are negative.      Objective:   Physical Exam Skin:    Comments: Left elbow wound has scabbed over area- no drainage noted no erythema or edema           Assessment & Plan:  Kaitlyn Serrano in today with chief complaint of left elbow wound   1. Open wound of left elbow, subsequent encounter (Primary) Keep area clean and dry Avoid messing with dressing Antibiotics as prescribed Meds ordered this encounter  Medications   sertraline  (ZOLOFT ) 50 MG tablet    Sig: Take 1 tablet (50 mg total) by mouth daily.    Dispense:  90 tablet    Refill:  0    Supervising Provider:   DETTINGER, JOSHUA A [[id number]]   pantoprazole  (PROTONIX ) 40 MG tablet    Sig: Take 1 tablet (40 mg total) by mouth 2 (two) times daily.    Dispense:  180 tablet    Refill:  3    Supervising Provider:   DETTINGER, JOSHUA A [[id number]]   donepezil  (ARICEPT ) 10 MG tablet    Sig: Take one tablet by mouth daily at 9pm bedtime for Dementia **NEEDS TO BE SEEN BEFORE NEXT REFILL**    Dispense:  30 tablet    Refill:  0    Supervising Provider:  DETTINGER, JOSHUA A [[id number]]   Melatonin ER 5 MG TBCR    Sig: Take 5 mg by mouth at bedtime as needed (sleep).    Dispense:  90 tablet    Refill:  4    Supervising Provider:   DETTINGER, JOSHUA A [[id number]]   ezetimibe  (ZETIA ) 10 MG tablet    Sig: Take 1 tablet (10 mg total) by mouth daily. TAKE ONE TABLET BY MOUTH DAILY AT 9AM    Dispense:  30 tablet    Refill:  0    Patient needs appointment 2nd attempt    Supervising Provider:   DETTINGER, JOSHUA A [[id number]]   losartan  (COZAAR ) 100 MG tablet    Sig: Take 1 tablet (100 mg total) by mouth daily.    Dispense:  30 tablet    Refill:  0    Supervising  Provider:   DETTINGER, JOSHUA A [[id number]]   metoprolol  succinate (TOPROL  XL) 25 MG 24 hr tablet    Sig: Take 1 tablet (25 mg total) by mouth daily.    Dispense:  90 tablet    Refill:  3    Supervising Provider:   DETTINGER, JOSHUA A [[id number]]   sulfamethoxazole -trimethoprim  (BACTRIM  DS) 800-160 MG tablet    Sig: Take 1 tablet by mouth 2 (two) times daily.    Dispense:  20 tablet    Refill:  0    Supervising Provider:   Jolyne Needs A [[id number]]      2. GAD (generalized anxiety disorder) Refilled meds until can see dr. Gottsckalk - sertraline  (ZOLOFT ) 50 MG tablet; Take 1 tablet (50 mg total) by mouth daily.  Dispense: 90 tablet; Refill: 0  3. Moderate episode of recurrent major depressive disorder (HCC) Refilled meds until can see dr. Gottsckalk - sertraline  (ZOLOFT ) 50 MG tablet; Take 1 tablet (50 mg total) by mouth daily.  Dispense: 90 tablet; Refill: 0  4. GERD (gastroesophageal reflux disease) Refilled meds until can see dr. Gottsckalk - pantoprazole  (PROTONIX ) 40 MG tablet; Take 1 tablet (40 mg total) by mouth 2 (two) times daily.  Dispense: 180 tablet; Refill: 3  5. Moderate dementia with agitation, unspecified dementia type (HCC) Refilled meds until can see dr. Sharmaine Dearth - donepezil  (ARICEPT ) 10 MG tablet; Take one tablet by mouth daily at 9pm bedtime for Dementia **NEEDS TO BE SEEN BEFORE NEXT REFILL**  Dispense: 30 tablet; Refill: 0  6. Primary insomnia Refilled meds until can see dr. Sharmaine Dearth - Melatonin ER 5 MG TBCR; Take 5 mg by mouth at bedtime as needed (sleep).  Dispense: 90 tablet; Refill: 4    The above assessment and management plan was discussed with the patient. The patient verbalized understanding of and has agreed to the management plan. Patient is aware to call the clinic if symptoms persist or worsen. Patient is aware when to return to the clinic for a follow-up visit. Patient educated on when it is appropriate to go to the emergency department.    Mary-Margaret Gaylyn Keas, FNP

## 2023-07-14 NOTE — Telephone Encounter (Signed)
 APPT TODAY SCHEDULED BY E2C2

## 2023-07-14 NOTE — Telephone Encounter (Signed)
  Chief Complaint: left elbow Symptoms: green and yellow drainage x 2-3 days Frequency: fall occurred one week ago Pertinent Negatives: Patient denies redness Disposition: [] ED /[] Urgent Care (no appt availability in office) / [] Appointment(In office/virtual)/ []  [facility]/ [] Home Care/ [] Refused Recommended Disposition /[] [facility]/ [x]  Follow-up with PCP Additional Notes: Fall one week ago with elbow injury/skin tear now looks infected.  Patient's daughter also reports increased agitation and symptoms of dementia. Reports that her mother has lost her medication and has not been taking them.  Also needs guidance on more home assistance   Reason for Disposition  [1] Pus or cloudy fluid draining from wound AND [2] no fever  Answer Assessment - Initial Assessment Questions 1. LOCATION: "Where is the wound located?"      Left elbow 2. WOUND APPEARANCE: "What does the wound look like?"      Green and yellow drainage 3. SIZE: If redness is present, ask: "What is the size of the red area?" (Inches, centimeters, or compare to size of a coin)      No redness noted 4. SPREAD: "What's changed in the last day?"  "Do you see any red streaks coming from the wound?"     no 5. ONSET: "When did it start to look infected?"     2-3 days ago 6. MECHANISM: "How did the wound start, what was the cause?"     Fall one week ago 7. PAIN: "Is there any pain?" If Yes, ask: "How bad is the pain?"   (Scale 1-10; or mild, moderate, severe)     "Patient states that it hurts" 8. FEVER: "Do you have a fever?" If Yes, ask: "What is your temperature, how was it measured, and when did it start?"     no 9. OTHER SYMPTOMS: "Do you have any other symptoms?" (e.g., shaking chills, weakness, rash elsewhere on body)     S/S dementia  Protocols used: Wound Infection-A-AH

## 2023-07-16 ENCOUNTER — Ambulatory Visit: Admitting: Family Medicine

## 2023-07-16 NOTE — Progress Notes (Deleted)
 Subjective: CC:*** PCP: Eliodoro Guerin, DO EXB:MWUXLKG Kaitlyn Serrano is a 83 y.o. female presenting to clinic today for:  1. 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 family unaware   Dysphagia    Dyspnea    Generalized weakness    GERD (gastroesophageal reflux disease)    GERD (gastroesophageal reflux disease)    High cholesterol    Hoarseness    Hx of cardiovascular stress test    negative 2012 and 2016   Hypertension    Insomnia    Intervertebral disc disorder with myelopathy, lumbar region    Lumbago    Lumbar spine pain    PONV (postoperative nausea and vomiting)    confusion after anesthesia   Renal insufficiency    Ringing in ears    resolved   Spondylosis with myelopathy, lumbar region    legs, back    Current Outpatient Medications:    aspirin  EC 81 MG tablet, Take 81 mg by mouth daily. Swallow whole., Disp: , Rfl:    busPIRone  (BUSPAR ) 10 MG tablet, Take 1 tablet (10 mg total) by mouth 3 (three) times daily., Disp: 270 tablet, Rfl: 0   donepezil  (ARICEPT ) 10 MG tablet, Take one tablet by mouth daily at 9pm bedtime for Dementia **NEEDS TO BE SEEN BEFORE NEXT REFILL**, Disp: 30 tablet, Rfl: 0   ezetimibe  (ZETIA ) 10 MG tablet, Take 1 tablet (10 mg total) by mouth daily. TAKE ONE TABLET BY MOUTH DAILY AT 9AM, Disp: 30 tablet, Rfl: 0   HYDROcodone -acetaminophen  (NORCO) 10-325 MG tablet, Take 1 tablet by mouth every 4 (four) hours as needed for severe pain (pain score 7-10)., Disp: , Rfl:    losartan  (COZAAR )  100 MG tablet, Take 1 tablet (100 mg total) by mouth daily., Disp: 30 tablet, Rfl: 0   Melatonin ER 5 MG TBCR, Take 5 mg by mouth at bedtime as needed (sleep)., Disp: 90 tablet, Rfl: 4   metoprolol  succinate (TOPROL  XL) 25 MG 24 hr tablet, Take 1 tablet (25 mg total) by mouth daily., Disp: 90 tablet, Rfl: 3   pantoprazole  (PROTONIX ) 40 MG tablet, Take 1 tablet (40 mg total) by mouth 2 (two) times daily., Disp: 180 tablet, Rfl: 3   polyethylene glycol (MIRALAX  / GLYCOLAX ) 17 g packet, Take 17 g by mouth 2 (two) times daily. (Patient taking differently: Take 17 g by mouth daily as needed for mild constipation.), Disp: 14 each, Rfl: 0   sertraline  (ZOLOFT ) 50 MG tablet, Take 1 tablet (50 mg total) by mouth daily., Disp: 90 tablet, Rfl: 0   sulfamethoxazole -trimethoprim  (BACTRIM  DS) 800-160 MG tablet, Take 1 tablet by mouth 2 (two) times daily., Disp: 20 tablet, Rfl: 0 Social History   Socioeconomic History   Marital status: Divorced    Spouse name: Not on file   Number of children: 2   Years of education: Not on file   Highest education level: Not on file  Occupational History   Not on  file  Tobacco Use   Smoking status: Former    Current packs/day: 0.00    Average packs/day: 4.0 packs/day for 3.7 years (14.8 ttl pk-yrs)    Types: Cigarettes    Start date: 06/10/1960    Quit date: 02/20/1964    Years since quitting: 59.4   Smokeless tobacco: Never  Vaping Use   Vaping status: Never Used  Substance and Sexual Activity   Alcohol use: No    Alcohol/week: 0.0 standard drinks of alcohol   Drug use: No   Sexual activity: Not Currently    Birth control/protection: None  Other Topics Concern   Not on file  Social History Narrative   ** Merged History Encounter **       ** Merged History Encounter **  Kaitlyn Serrano lives alone. She is separated/divorced from her second husband and widowed by her first husband. She has twin daughters, Kaitlyn Serrano and Kaitlyn Serrano. Kaitlyn Serrano has been a cause of stress for  her and she believes was preven   ting her from getting medical care that she needed. She is no longer talking to [place]. She states that Kaitlyn Serrano is involved in her care and that she trusts her to have her best interest in mind.      Social Drivers of Corporate investment banker Strain: Low Risk  (07/11/2022)   Overall Financial Resource Strain (CARDIA)    Difficulty of Paying Living Expenses: Not hard at all  Food Insecurity: No Food Insecurity (04/09/2023)   Hunger Vital Sign    Worried About Running Out of Food in the Last Year: Never true    Ran Out of Food in the Last Year: Never true  Transportation Needs: No Transportation Needs (04/09/2023)   PRAPARE - Administrator, Civil Service (Medical): No    Lack of Transportation (Non-Medical): No  Physical Activity: Insufficiently Active (07/11/2022)   Exercise Vital Sign    Days of Exercise per Week: 3 days    Minutes of Exercise per Session: 30 min  Stress: No Stress Concern Present (07/11/2022)   [name] - Occupational Stress Questionnaire    Feeling of Stress : Not at all  Social Connections: Patient Declined (04/10/2023)   Social Connection and Isolation Panel [NHANES]    Frequency of Communication with Friends and Family: Patient declined    Frequency of Social Gatherings with Friends and Family: Patient declined    Attends Religious Services: Patient declined    Database administrator or Organizations: Patient declined    Attends Banker Meetings: Patient declined    Marital Status: Patient declined  Recent Concern: Social Connections - Socially Isolated (04/03/2023)   Social Connection and Isolation Panel [NHANES]    Frequency of Communication with Friends and Family: More than three times a week    Frequency of Social Gatherings with Friends and Family: More than three times a week    Attends Religious Services: Never    Database administrator or Organizations: No     Attends Banker Meetings: Never    Marital Status: Widowed  Intimate Partner Violence: Not At Risk (04/09/2023)   Humiliation, Afraid, Rape, and Kick questionnaire    Fear of Current or Ex-Partner: No    Emotionally Abused: No    Physically Abused: No    Sexually Abused: No   Family History  Problem Relation Age of Onset   Diabetes Mother    Heart disease Mother  Hypertension Mother    Gallbladder disease Mother    Heart disease Father    Pneumonia Sister    Breast cancer Paternal Grandmother    Hypertension Brother    Hyperlipidemia Brother    Hypertension Child    Gallbladder disease Other     Objective: Office vital signs reviewed. There were no vitals taken for this visit.  Physical Examination:  General: Awake, alert, *** nourished, No acute distress HEENT: Normal    Neck: No masses palpated. No lymphadenopathy    Ears: Tympanic membranes intact, normal light reflex, no erythema, no bulging    Eyes: PERRLA, extraocular membranes intact, sclera ***    Nose: nasal turbinates moist, *** nasal discharge    Throat: moist mucus membranes, no erythema, *** tonsillar exudate.  Airway is patent Cardio: regular rate and rhythm, S1S2 heard, no murmurs appreciated Pulm: clear to auscultation bilaterally, no wheezes, rhonchi or rales; normal work of breathing on room air GI: soft, non-tender, non-distended, bowel sounds present x4, no hepatomegaly, no splenomegaly, no masses GU: external vaginal tissue ***, cervix ***, *** punctate lesions on cervix appreciated, *** discharge from cervical os, *** bleeding, *** cervical motion tenderness, *** abdominal/ adnexal masses Extremities: warm, well perfused, No edema, cyanosis or clubbing; +*** pulses bilaterally MSK: *** gait and *** station Skin: dry; intact; no rashes or lesions Neuro: *** Strength and light touch sensation grossly intact, *** DTRs ***/4  Assessment/ Plan: 83 y.o. female   GAD (generalized anxiety  disorder)  Moderate episode of recurrent major depressive disorder (HCC)  Moderate dementia with agitation, unspecified dementia type (HCC)  ***   Elias Bordner Bambi Bonine, DO [facility] [phone]

## 2023-07-18 ENCOUNTER — Other Ambulatory Visit: Payer: Self-pay

## 2023-07-18 ENCOUNTER — Encounter (HOSPITAL_COMMUNITY): Payer: Self-pay

## 2023-07-18 ENCOUNTER — Emergency Department (HOSPITAL_COMMUNITY)
Admission: EM | Admit: 2023-07-18 | Discharge: 2023-07-19 | Discharge status: Against Medical Advice | Attending: Emergency Medicine | Admitting: Emergency Medicine

## 2023-07-18 ENCOUNTER — Ambulatory Visit: Admitting: Family Medicine

## 2023-07-18 DIAGNOSIS — Z5321 Procedure and treatment not carried out due to patient leaving prior to being seen by health care provider: Secondary | ICD-10-CM | POA: Insufficient documentation

## 2023-07-18 DIAGNOSIS — Z4800 Encounter for change or removal of nonsurgical wound dressing: Secondary | ICD-10-CM | POA: Insufficient documentation

## 2023-07-18 NOTE — Progress Notes (Deleted)
 Subjective: CC:*** PCP: Eliodoro Guerin, DO TDD:UKGURKY T Rutherford Cowing is a 83 y.o. female presenting to clinic today for:  1. 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 family unaware   Dysphagia    Dyspnea    Generalized weakness    GERD (gastroesophageal reflux disease)    GERD (gastroesophageal reflux disease)    High cholesterol    Hoarseness    Hx of cardiovascular stress test    negative 2012 and 2016   Hypertension    Insomnia    Intervertebral disc disorder with myelopathy, lumbar region    Lumbago    Lumbar spine pain    PONV (postoperative nausea and vomiting)    confusion after anesthesia   Renal insufficiency    Ringing in ears    resolved   Spondylosis with myelopathy, lumbar region    legs, back    Current Outpatient Medications:    aspirin  EC 81 MG tablet, Take 81 mg by mouth daily. Swallow whole., Disp: , Rfl:    busPIRone  (BUSPAR ) 10 MG tablet, Take 1 tablet (10 mg total) by mouth 3 (three) times daily., Disp: 270 tablet, Rfl: 0   donepezil  (ARICEPT ) 10 MG tablet, Take one tablet by mouth daily at 9pm bedtime for Dementia **NEEDS TO BE SEEN BEFORE NEXT REFILL**, Disp: 30 tablet, Rfl: 0   ezetimibe  (ZETIA ) 10 MG tablet, Take 1 tablet (10 mg total) by mouth daily. TAKE ONE TABLET BY MOUTH DAILY AT 9AM, Disp: 30 tablet, Rfl: 0   HYDROcodone -acetaminophen  (NORCO) 10-325 MG tablet, Take 1 tablet by mouth every 4 (four) hours as needed for severe pain (pain score 7-10)., Disp: , Rfl:    losartan  (COZAAR )  100 MG tablet, Take 1 tablet (100 mg total) by mouth daily., Disp: 30 tablet, Rfl: 0   Melatonin ER 5 MG TBCR, Take 5 mg by mouth at bedtime as needed (sleep)., Disp: 90 tablet, Rfl: 4   metoprolol  succinate (TOPROL  XL) 25 MG 24 hr tablet, Take 1 tablet (25 mg total) by mouth daily., Disp: 90 tablet, Rfl: 3   pantoprazole  (PROTONIX ) 40 MG tablet, Take 1 tablet (40 mg total) by mouth 2 (two) times daily., Disp: 180 tablet, Rfl: 3   polyethylene glycol (MIRALAX  / GLYCOLAX ) 17 g packet, Take 17 g by mouth 2 (two) times daily. (Patient taking differently: Take 17 g by mouth daily as needed for mild constipation.), Disp: 14 each, Rfl: 0   sertraline  (ZOLOFT ) 50 MG tablet, Take 1 tablet (50 mg total) by mouth daily., Disp: 90 tablet, Rfl: 0   sulfamethoxazole -trimethoprim  (BACTRIM  DS) 800-160 MG tablet, Take 1 tablet by mouth 2 (two) times daily., Disp: 20 tablet, Rfl: 0 Social History   Socioeconomic History   Marital status: Divorced    Spouse name: Not on file   Number of children: 2   Years of education: Not on file   Highest education level: Not on file  Occupational History   Not on  file  Tobacco Use   Smoking status: Former    Current packs/day: 0.00    Average packs/day: 4.0 packs/day for 3.7 years (14.8 ttl pk-yrs)    Types: Cigarettes    Start date: 06/10/1960    Quit date: 02/20/1964    Years since quitting: 59.4   Smokeless tobacco: Never  Vaping Use   Vaping status: Never Used  Substance and Sexual Activity   Alcohol use: No    Alcohol/week: 0.0 standard drinks of alcohol   Drug use: No   Sexual activity: Not Currently    Birth control/protection: None  Other Topics Concern   Not on file  Social History Narrative   ** Merged History Encounter **       ** Merged History Encounter **  Ms Rutherford Cowing lives alone. She is separated/divorced from her second husband and widowed by her first husband. She has twin daughters, [name] and [name]. [name] has been a cause of stress for  her and she believes was preven   ting her from getting medical care that she needed. She is no longer talking to [place]. She states that [name] is involved in her care and that she trusts her to have her best interest in mind.      Social Drivers of Corporate investment banker Strain: Low Risk  (07/11/2022)   Overall Financial Resource Strain (CARDIA)    Difficulty of Paying Living Expenses: Not hard at all  Food Insecurity: No Food Insecurity (04/09/2023)   Hunger Vital Sign    Worried About Running Out of Food in the Last Year: Never true    Ran Out of Food in the Last Year: Never true  Transportation Needs: No Transportation Needs (04/09/2023)   PRAPARE - Administrator, Civil Service (Medical): No    Lack of Transportation (Non-Medical): No  Physical Activity: Insufficiently Active (07/11/2022)   Exercise Vital Sign    Days of Exercise per Week: 3 days    Minutes of Exercise per Session: 30 min  Stress: No Stress Concern Present (07/11/2022)   [name] - Occupational Stress Questionnaire    Feeling of Stress : Not at all  Social Connections: Patient Declined (04/10/2023)   Social Connection and Isolation Panel [NHANES]    Frequency of Communication with Friends and Family: Patient declined    Frequency of Social Gatherings with Friends and Family: Patient declined    Attends Religious Services: Patient declined    Database administrator or Organizations: Patient declined    Attends Banker Meetings: Patient declined    Marital Status: Patient declined  Recent Concern: Social Connections - Socially Isolated (04/03/2023)   Social Connection and Isolation Panel [NHANES]    Frequency of Communication with Friends and Family: More than three times a week    Frequency of Social Gatherings with Friends and Family: More than three times a week    Attends Religious Services: Never    Database administrator or Organizations: No     Attends Banker Meetings: Never    Marital Status: Widowed  Intimate Partner Violence: Not At Risk (04/09/2023)   Humiliation, Afraid, Rape, and Kick questionnaire    Fear of Current or Ex-Partner: No    Emotionally Abused: No    Physically Abused: No    Sexually Abused: No   Family History  Problem Relation Age of Onset   Diabetes Mother    Heart disease Mother  Hypertension Mother    Gallbladder disease Mother    Heart disease Father    Pneumonia Sister    Breast cancer Paternal Grandmother    Hypertension Brother    Hyperlipidemia Brother    Hypertension Child    Gallbladder disease Other     Objective: Office vital signs reviewed. There were no vitals taken for this visit.  Physical Examination:  General: Awake, alert, *** nourished, No acute distress HEENT: Normal    Neck: No masses palpated. No lymphadenopathy    Ears: Tympanic membranes intact, normal light reflex, no erythema, no bulging    Eyes: PERRLA, extraocular membranes intact, sclera ***    Nose: nasal turbinates moist, *** nasal discharge    Throat: moist mucus membranes, no erythema, *** tonsillar exudate.  Airway is patent Cardio: regular rate and rhythm, S1S2 heard, no murmurs appreciated Pulm: clear to auscultation bilaterally, no wheezes, rhonchi or rales; normal work of breathing on room air GI: soft, non-tender, non-distended, bowel sounds present x4, no hepatomegaly, no splenomegaly, no masses GU: external vaginal tissue ***, cervix ***, *** punctate lesions on cervix appreciated, *** discharge from cervical os, *** bleeding, *** cervical motion tenderness, *** abdominal/ adnexal masses Extremities: warm, well perfused, No edema, cyanosis or clubbing; +*** pulses bilaterally MSK: *** gait and *** station Skin: dry; intact; no rashes or lesions Neuro: *** Strength and light touch sensation grossly intact, *** DTRs ***/4  Assessment/ Plan: 83 y.o. female   No diagnosis  found.  ***   Juvia Aerts Bambi Bonine, DO [facility] [phone]

## 2023-07-18 NOTE — ED Triage Notes (Signed)
 Pt fell and has wounds on both forearms from fall. Seen at [facility] and was sent her due to arm wounds. Pt also hit head when she feel but is not on blood thinners

## 2023-07-22 ENCOUNTER — Other Ambulatory Visit: Payer: Self-pay | Admitting: Nurse Practitioner

## 2023-07-22 ENCOUNTER — Other Ambulatory Visit: Payer: Self-pay | Admitting: Family Medicine

## 2023-07-23 ENCOUNTER — Ambulatory Visit: Payer: Self-pay

## 2023-07-23 ENCOUNTER — Ambulatory Visit (HOSPITAL_COMMUNITY): Admission: RE | Admit: 2023-07-23 | Source: Ambulatory Visit

## 2023-07-23 ENCOUNTER — Encounter: Payer: Self-pay | Admitting: Family Medicine

## 2023-07-23 ENCOUNTER — Ambulatory Visit: Admitting: Family Medicine

## 2023-07-23 VITALS — BP 127/66 | HR 59 | Temp 97.0°F | Ht 62.0 in | Wt 116.8 lb

## 2023-07-23 DIAGNOSIS — S51812A Laceration without foreign body of left forearm, initial encounter: Secondary | ICD-10-CM | POA: Diagnosis not present

## 2023-07-23 DIAGNOSIS — F03B11 Unspecified dementia, moderate, with agitation: Secondary | ICD-10-CM

## 2023-07-23 DIAGNOSIS — S51011D Laceration without foreign body of right elbow, subsequent encounter: Secondary | ICD-10-CM | POA: Diagnosis not present

## 2023-07-23 DIAGNOSIS — Z1331 Encounter for screening for depression: Secondary | ICD-10-CM

## 2023-07-23 DIAGNOSIS — S0990XD Unspecified injury of head, subsequent encounter: Secondary | ICD-10-CM | POA: Diagnosis not present

## 2023-07-23 NOTE — Telephone Encounter (Signed)
Apt scheduled today.

## 2023-07-23 NOTE — Telephone Encounter (Signed)
 Copied From CRM [id number]. Reason for Triage: Pt's daughter Shelvy Dickens said that the pt fell and has been seen twice since then. The dr prescribed some antibiotics and feels more is needed so the area where the skin is peeled off doesn't get infected. Please follow up with her at [phone].    Chief Complaint: wound infection follow-up Symptoms: pus coming from wound Frequency: constant Pertinent Negatives: Patient denies fever Disposition: [] ED /[] Urgent Care (no appt availability in office) / [] Appointment(In office/virtual)/ []  [facility]/ [] Home Care/ [] Refused Recommended Disposition /[] [facility]/ []  Follow-up with PCP Additional Notes: completed abx yesterday, still has pus coming from wound and moderate pain, no fever. Appt scheduled for today at 11:20  Reason for Disposition  [1] Finished taking antibiotic AND [2] wound infection symptoms are WORSE (e.g., draining pus, pain, red streak, spreading redness, swelling)  Answer Assessment - Initial Assessment Questions 1. SYMPTOM: "What's the main symptom you're concerned about?" (e.g., redness, swelling, pain, fever, weakness)     Skin tear, looks like its still infection  2. WOUND INFECTION LOCATION: "Where is the wound infection located?" (e.g., arm, face, foot, knee, leg)     Left forearm  3. WOUND INFECTION SIZE: "What is the size of the red area?" (e.g., inches, centimeters; compare to size of a coin)      *No Answer* 4. BETTER-SAME-WORSE: "Are you getting better, staying the same, or getting worse compared to the day you started the antibiotics?"      Same  5. PAIN: "Do you have any pain?"  If Yes, ask: "How bad is the pain?"  (e.g., Scale 1-10; mild, moderate, or severe)    - MILD (1-3): Doesn't interfere with normal activities.     - MODERATE (4-7): Interferes with normal activities or awakens from sleep.    - SEVERE (8-10): Excruciating pain, unable to do any normal activities.       Moderate  pain  6. FEVER: "Do you have a fever?" If Yes, ask: "What is it, how was it measured and when did it start?"     No  7. OTHER SYMPTOMS: "Do you have any other symptoms?" (e.g., pus coming from a wound, red streaks, weakness)     Pus coming from wound  8. DIAGNOSIS DATE: "When was the wound infection diagnosed?" "By whom?"      5/19   9. ANTIBIOTIC NAME: "What antibiotic(s) are you taking?"  "How many times a day?" Note: Be sure the patient is taking the antibiotic as directed.      Bactrim   10. ANTIBIOTIC DATE: "When was the antibiotic started?"       5/19  11. FOLLOW-UP APPOINTMENT: "Do you have a follow-up appointment with your doctor?"       No f/u appt scheduled  Protocols used: Wound Infection on Antibiotic Follow-up Call-A-[facility]

## 2023-07-23 NOTE — Progress Notes (Signed)
 Subjective:  Patient ID: Kaitlyn Serrano, female    DOB: 04/18/1940, 83 y.o.   MRN: [id number]  Patient Care Team: Eliodoro Guerin, DO as PCP - General (Family Medicine) Amanda Jungling Joyceann No, MD as PCP - Cardiology (Cardiology) Vickii Grand, PA-C as Physician Assistant (Orthopedic Surgery) Claiborne Crew, MD as Consulting Physician (Orthopedic Surgery) Ashok Blake, DPM as Consulting Physician (Podiatry)   Chief Complaint:  wound  HPI: Kaitlyn Serrano is a 83 y.o. female presenting on 07/23/2023 for wound  HPI Patient is a very poor historian. She initially presented with daughter, Kaitlyn Serrano, who was only present for the end of the visit. From patient report and chart review, patient has had multiple falls. Recently seen on 07/03/2023 for fall at [facility]. She was then seen again 07/14/23 and received bactrim  for wound infection. She was evaluated at [facility] urgent care on 07/18/23 for another fall that occurred 07/17/23 in which she fell in her driveway and hit her left forearm and face. She was instructed to follow up at ED for imaging. She presented to [facility], but did not complete imaging.  States that since the fall, she has not felt good. She states that she tired and dizzy. States that she has been laying around. She is taking bactrim  and wonders if she needs a refill. She reports that she stays weak as she does not eat that much. She would like to start ensure.  Relevant past medical, surgical, family, and social history reviewed and updated as indicated.  Allergies and medications reviewed and updated. Data reviewed: Chart in Epic.   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 family unaware   Dysphagia    Dyspnea    Generalized weakness    GERD (gastroesophageal reflux disease)    GERD (gastroesophageal reflux  disease)    High cholesterol    Hoarseness    Hx of cardiovascular stress test    negative 2012 and 2016   Hypertension    Insomnia    Intervertebral disc disorder with myelopathy, lumbar region    Lumbago    Lumbar spine pain    PONV (postoperative nausea and vomiting)    confusion after anesthesia   Renal insufficiency    Ringing in ears    resolved   Spondylosis with myelopathy, lumbar region    legs, back    Past Surgical History:  Procedure Laterality Date   ABDOMINAL HYSTERECTOMY     BACK SURGERY     BONE MARROW ASPIRATION     CERVICAL SPINE SURGERY     EYE SURGERY Bilateral    Lasic   FIXATION KYPHOPLASTY     LAMINOTOMY     LEFT HEART CATH AND CORONARY ANGIOGRAPHY N/A 04/03/2023   Procedure: LEFT HEART CATH AND CORONARY ANGIOGRAPHY;  Surgeon: Sammy Crisp, MD;  Location: [facility] INVASIVE CV LAB;  Service: Cardiovascular;  Laterality: N/A;   SPINAL FUSION     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   Procedure: TOTAL HIP ARTHROPLASTY ANTERIOR APPROACH;  Surgeon: Claiborne Crew, MD;  Location: [facility] ORS;  Service: Orthopedics;  Laterality: Right;  70 mins   TOTAL HIP ARTHROPLASTY Left 05/14/2022   Procedure: TOTAL HIP ARTHROPLASTY ANTERIOR APPROACH;  Surgeon: Claiborne Crew, MD;  Location: [facility];  Service: Orthopedics;  Laterality: Left;  Social History   Socioeconomic History   Marital status: Divorced    Spouse name: Not on file   Number of children: 2   Years of education: Not on file   Highest education level: Not on file  Occupational History   Not on file  Tobacco Use   Smoking status: Former    Current packs/day: 0.00    Average packs/day: 4.0 packs/day for 3.7 years (14.8 ttl pk-yrs)    Types: Cigarettes    Start date: 06/10/1960    Quit date: 02/20/1964    Years since quitting: 59.4   Smokeless tobacco: Never  Vaping Use   Vaping status: Never Used  Substance and Sexual Activity   Alcohol use: No    Alcohol/week: 0.0 standard drinks of alcohol    Drug use: No   Sexual activity: Not Currently    Birth control/protection: None  Other Topics Concern   Not on file  Social History Narrative   ** Merged History Encounter **       ** Merged History Encounter **  Ms Rutherford Cowing lives alone. She is separated/divorced from her second husband and widowed by her first husband. She has twin daughters, Kaitlyn Serrano and Kaitlyn Serrano. Kaitlyn Serrano has been a cause of stress for her and she believes was preven   ting her from getting medical care that she needed. She is no longer talking to [place]. She states that Kaitlyn Serrano is involved in her care and that she trusts her to have her best interest in mind.      Social Drivers of Corporate investment banker Strain: Low Risk  (07/11/2022)   Overall Financial Resource Strain (CARDIA)    Difficulty of Paying Living Expenses: Not hard at all  Food Insecurity: No Food Insecurity (04/09/2023)   Hunger Vital Sign    Worried About Running Out of Food in the Last Year: Never true    Ran Out of Food in the Last Year: Never true  Transportation Needs: No Transportation Needs (04/09/2023)   PRAPARE - Administrator, Civil Service (Medical): No    Lack of Transportation (Non-Medical): No  Physical Activity: Insufficiently Active (07/11/2022)   Exercise Vital Sign    Days of Exercise per Week: 3 days    Minutes of Exercise per Session: 30 min  Stress: No Stress Concern Present (07/11/2022)   [name] - Occupational Stress Questionnaire    Feeling of Stress : Not at all  Social Connections: Patient Declined (04/10/2023)   Social Connection and Isolation Panel [NHANES]    Frequency of Communication with Friends and Family: Patient declined    Frequency of Social Gatherings with Friends and Family: Patient declined    Attends Religious Services: Patient declined    Database administrator or Organizations: Patient declined    Attends Banker Meetings: Patient declined    Marital  Status: Patient declined  Recent Concern: Social Connections - Socially Isolated (04/03/2023)   Social Connection and Isolation Panel [NHANES]    Frequency of Communication with Friends and Family: More than three times a week    Frequency of Social Gatherings with Friends and Family: More than three times a week    Attends Religious Services: Never    Database administrator or Organizations: No    Attends Banker Meetings: Never    Marital Status: Widowed  Intimate Partner Violence: Not At Risk (04/09/2023)   Humiliation, Afraid, Rape, and Kick questionnaire  Fear of Current or Ex-Partner: No    Emotionally Abused: No    Physically Abused: No    Sexually Abused: No    Outpatient Encounter Medications as of 07/23/2023  Medication Sig   aspirin  EC 81 MG tablet Take 81 mg by mouth daily. Swallow whole.   busPIRone  (BUSPAR ) 10 MG tablet Take 1 tablet (10 mg total) by mouth 3 (three) times daily.   donepezil  (ARICEPT ) 10 MG tablet Take one tablet by mouth daily at 9pm bedtime for Dementia **NEEDS TO BE SEEN BEFORE NEXT REFILL**   ezetimibe  (ZETIA ) 10 MG tablet Take 1 tablet (10 mg total) by mouth daily. TAKE ONE TABLET BY MOUTH DAILY AT 9AM   HYDROcodone -acetaminophen  (NORCO) 10-325 MG tablet Take 1 tablet by mouth every 4 (four) hours as needed for severe pain (pain score 7-10).   losartan  (COZAAR ) 100 MG tablet Take 1 tablet (100 mg total) by mouth daily.   Melatonin ER 5 MG TBCR Take 5 mg by mouth at bedtime as needed (sleep).   metoprolol  succinate (TOPROL  XL) 25 MG 24 hr tablet Take 1 tablet (25 mg total) by mouth daily.   pantoprazole  (PROTONIX ) 40 MG tablet Take 1 tablet (40 mg total) by mouth 2 (two) times daily.   polyethylene glycol (MIRALAX  / GLYCOLAX ) 17 g packet Take 17 g by mouth 2 (two) times daily. (Patient taking differently: Take 17 g by mouth daily as needed for mild constipation.)   sertraline  (ZOLOFT ) 50 MG tablet Take 1 tablet (50 mg total) by mouth daily.    sulfamethoxazole -trimethoprim  (BACTRIM  DS) 800-160 MG tablet Take 1 tablet by mouth 2 (two) times daily.   No facility-administered encounter medications on file as of 07/23/2023.    Allergies  Allergen Reactions   Bee Venom Anaphylaxis   Penicillins Anaphylaxis and Rash   Amlodipine  Swelling   Elavil  [Amitriptyline  Hcl] Other (See Comments)    Confusion, hallucinations   Statins Swelling and Other (See Comments)    Peeling of skin    Review of Systems As per HPI  Objective:  BP 127/66   Pulse (!) 59   Temp (!) 97 F (36.1 C)   Ht 5\' 2"  (1.575 m)   Wt 116 lb 12.8 oz (53 kg)   SpO2 96%   BMI 21.36 kg/m    Wt Readings from Last 3 Encounters:  07/23/23 116 lb 12.8 oz (53 kg)  07/18/23 119 lb 0.8 oz (54 kg)  07/14/23 118 lb (53.5 kg)   Physical Exam Constitutional:      General: She is awake. She is not in acute distress.    Appearance: Normal appearance. She is well-groomed. She is not ill-appearing, toxic-appearing or diaphoretic.  Cardiovascular:     Rate and Rhythm: Normal rate and regular rhythm.     Heart sounds: Normal heart sounds.  Pulmonary:     Effort: Pulmonary effort is normal.     Breath sounds: Normal breath sounds.  Skin:    Findings: Bruising, ecchymosis, signs of injury and laceration present.     Comments: 6 cm laceration along right elbow dry, eschar  Approximately 5 cm x 5 cm laceration along left forearm with scant serosanguinous drainage.  Approximately 2 cm x 0.5cm laceration on upper left forearm dry with eschar  Approximately 4 cm x 1 cm laceration along posterior left forearm with scant serosanguinous drainage.  Bruising and ecchymosis on left temple/periorbital region   Neurological:     Mental Status: She is alert and easily aroused. She  is confused.     Motor: Motor function is intact.     Coordination: Coordination is intact.  Psychiatric:        Attention and Perception: She is inattentive.        Mood and Affect: Mood and affect  normal.        Speech: Speech normal.        Behavior: Behavior is hyperactive. Behavior is cooperative.        Thought Content: Thought content normal.        Cognition and Memory: Cognition is impaired. She exhibits impaired recent memory and impaired remote memory.        Judgment: Judgment is impulsive.         Results for orders placed or performed in visit on 05/26/23  CBC with Differential/Platelet   Collection Time: 05/26/23  9:34 AM  Result Value Ref Range   WBC 8.4 3.4 - 10.8 x10E3/uL   RBC 4.12 3.77 - 5.28 x10E6/uL   Hemoglobin 12.6 11.1 - 15.9 g/dL   Hematocrit 35.5 73.2 - 46.6 %   MCV 92 79 - 97 fL   MCH 30.6 26.6 - 33.0 pg   MCHC 33.4 31.5 - 35.7 g/dL   RDW 20.2 54.2 - 70.6 %   Platelets 280 150 - 450 x10E3/uL   Neutrophils 75 Not Estab. %   Lymphs 13 Not Estab. %   Monocytes 8 Not Estab. %   Eos 3 Not Estab. %   Basos 1 Not Estab. %   Neutrophils Absolute 6.3 1.4 - 7.0 x10E3/uL   Lymphocytes Absolute 1.1 0.7 - 3.1 x10E3/uL   Monocytes Absolute 0.6 0.1 - 0.9 x10E3/uL   EOS (ABSOLUTE) 0.3 0.0 - 0.4 x10E3/uL   Basophils Absolute 0.1 0.0 - 0.2 x10E3/uL   Immature Granulocytes 0 Not Estab. %   Immature Grans (Abs) 0.0 0.0 - 0.1 x10E3/uL       07/14/2023    2:55 PM 05/26/2023    9:59 AM 05/08/2023    9:46 AM 04/16/2023   11:08 AM 01/16/2023    2:16 PM  Depression screen PHQ 2/9  Decreased Interest 2 1 1 2 3   Down, Depressed, Hopeless 3 0 2 2 0  PHQ - 2 Score 5 1 3 4 3   Altered sleeping 3  0 0 0  Tired, decreased energy 1  0 1 3  Change in appetite 1  0 1 3  Feeling bad or failure about yourself  1  0 3 0  Trouble concentrating 3  0 1 0  Moving slowly or fidgety/restless 0  0 0 0  Suicidal thoughts 0  0 0 0  PHQ-9 Score 14  3 10 9   Difficult doing work/chores Extremely dIfficult  Not difficult at all Not difficult at all Not difficult at all       07/14/2023    2:56 PM 05/08/2023    9:47 AM 04/16/2023   11:08 AM 01/16/2023    2:16 PM  GAD 7 :  Generalized Anxiety Score  Nervous, Anxious, on Edge 3 3 3 1   Control/stop worrying 3 2 2 1   Worry too much - different things 3 2 2 1   Trouble relaxing 3 2 2 1   Restless 3 2 2  0  Easily annoyed or irritable 3 2 2  0  Afraid - awful might happen 3 2 2  0  Total GAD 7 Score 21 15 15 4   Anxiety Difficulty Extremely difficult Somewhat difficult Somewhat difficult Somewhat difficult  Pertinent labs & imaging results that were available during my care of the patient were reviewed by me and considered in my medical decision making.  Assessment & Plan:  Andromeda was seen today for wound.  Diagnoses and all orders for this visit:  Traumatic injury of head, subsequent encounter Given patient worsening symptoms and recent fall, imaging as below. Will communicate results to patient once available. Will await results to determine next steps.  Reviewed notes from 07/18/23 Mercie Stalker, [place]  Reviewed notes from 07/14/23 Gaylyn Keas, NP  Reviewed notes from 07/03/23 Britta Candy, NP  -     CT HEAD WO CONTRAST ( ); Future  Moderate dementia with agitation, unspecified dementia type [facility]) Patient confused to year. She is very poor historian. Difficult to complete exam without family present. Patient very forgetful during exam and kept looking for daughter. Repeated questions and story multiple times. Patient to follow up with PCP   Skin tear of forearm without complication, left, initial encounter Wound cleaned with normal saline. Debrided necrotic tissue. Applied xeroform and gauze covering. Provided patient with supplies to continue wound dressing at home.   Skin tear of right elbow without complication, subsequent encounter As above.   Encounter for screening for depression Patient positive for depression. Denies SI. Patient to follow up with PCP.    Continue all other maintenance medications.  Follow up plan: Return in about 1 week (around 07/30/2023).   Continue healthy lifestyle choices, including diet  (rich in fruits, vegetables, and lean proteins, and low in salt and simple carbohydrates) and exercise (at least 30 minutes of moderate physical activity daily).  Written and verbal instructions provided   The above assessment and management plan was discussed with the patient. The patient verbalized understanding of and has agreed to the management plan. Patient is aware to call the clinic if they develop any new symptoms or if symptoms persist or worsen. Patient is aware when to return to the clinic for a follow-up visit. Patient educated on when it is appropriate to go to the emergency department.   Jacqualyn Mates, DNP-FNP [facility] [address] [place], [place] [address] [phone]

## 2023-07-24 ENCOUNTER — Ambulatory Visit: Payer: Self-pay | Attending: Nurse Practitioner | Admitting: Nurse Practitioner

## 2023-07-24 NOTE — Progress Notes (Deleted)
 Cardiology Office Note:    Date:  04/17/2022  ID:  Kaitlyn Serrano, DOB [birth year], MRN [id number]  PCP:  Eliodoro Guerin, DO   [facility] Providers Cardiologist:  Armida Lander, MD     Referring MD: Eliodoro Guerin, DO   CC: Here for follow-up  History of Present Illness:    Kaitlyn Serrano is a 83 y.o. female with a hx of the following:  CAD History of palpitations CHF Hypertension Hyperlipidemia Type 2 diabetes  Patient is a very pleasant 83 year old female with past medical history as mentioned above.  Last seen by Mohammed Andrew, NP on June 08, 2020 for 13-month follow-up.  Noted some mild shortness of breath occasionally, was not very active.  Overall was doing well.  At previous office visit, hydralazine  was added to medication regimen, blood pressure was elevated today as she had not taken hydralazine  at that time. Was advised to give office a call if BP was not at goal. She was started on Hydralazine  25 mg BID. Was told to f/u in 6 months.   Today she is here for pending left hip arthroplasty. Surgery date is 05/14/2022. Surgery will be performed by Dr. Claiborne Crew of [facility]. Today she states she is doing well. Denies any chest pain, shortness of breath, palpitations, syncope, presyncope, dizziness, orthopnea, PND, swelling or significant weight changes, acute bleeding, or claudication.   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 family unaware   Dysphagia    Dyspnea    Generalized weakness    GERD (gastroesophageal reflux disease)    GERD (gastroesophageal reflux disease)    High cholesterol    Hoarseness    Hx of cardiovascular stress test    negative 2012 and 2016   Hypertension    Insomnia    Intervertebral disc disorder with myelopathy, lumbar region     Lumbago    Lumbar spine pain    PONV (postoperative nausea and vomiting)    confusion after anesthesia   Renal insufficiency    Ringing in ears    resolved   Spondylosis with myelopathy, lumbar region    legs, back    Past Surgical History:  Procedure Laterality Date   ABDOMINAL HYSTERECTOMY     BACK SURGERY     BONE MARROW ASPIRATION     CERVICAL SPINE SURGERY     EYE SURGERY Bilateral    Lasic   FIXATION KYPHOPLASTY     LAMINOTOMY     LEFT HEART CATH AND CORONARY ANGIOGRAPHY N/A 04/03/2023   Procedure: LEFT HEART CATH AND CORONARY ANGIOGRAPHY;  Surgeon: Sammy Crisp, MD;  Location: [facility];  Service: Cardiovascular;  Laterality: N/A;   SPINAL FUSION     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   Procedure: TOTAL HIP ARTHROPLASTY ANTERIOR APPROACH;  Surgeon: Claiborne Crew, MD;  Location: [facility];  Service: Orthopedics;  Laterality: Right;  70 mins   TOTAL HIP ARTHROPLASTY Left 05/14/2022   Procedure: TOTAL HIP ARTHROPLASTY ANTERIOR APPROACH;  Surgeon: Claiborne Crew, MD;  Location: [facility];  Service: Orthopedics;  Laterality: Left;    Current Medications: No outpatient medications have been marked as taking for the 07/24/23 encounter (Appointment) with Lasalle Pointer, NP.     Allergies:   Bee venom, Penicillins, Amlodipine , Elavil  [  amitriptyline  hcl], and Statins   Social History   Socioeconomic History   Marital status: Divorced    Spouse name: Not on file   Number of children: 2   Years of education: Not on file   Highest education level: Not on file  Occupational History   Not on file  Tobacco Use   Smoking status: Former    Current packs/day: 0.00    Average packs/day: 4.0 packs/day for 3.7 years (14.8 ttl pk-yrs)    Types: Cigarettes    Start date: 06/10/1960    Quit date: 02/20/1964    Years since quitting: 59.4   Smokeless tobacco: Never  Vaping Use   Vaping status: Never Used  Substance and Sexual Activity   Alcohol use: No    Alcohol/week:  0.0 standard drinks of alcohol   Drug use: No   Sexual activity: Not Currently    Birth control/protection: None  Other Topics Concern   Not on file  Social History Narrative   ** Merged History Encounter **       ** Merged History Encounter **  Kaitlyn Serrano lives alone. She is separated/divorced from her second husband and widowed by her first husband. She has twin daughters, Kaitlyn Serrano and Kaitlyn Serrano. Kaitlyn Serrano has been a cause of stress for her and she believes was preven   ting her from getting medical care that she needed. She is no longer talking to [place]. She states that Kaitlyn Serrano is involved in her care and that she trusts her to have her best interest in mind.      Social Drivers of Corporate investment banker Strain: Low Risk  (07/11/2022)   Overall Financial Resource Strain (CARDIA)    Difficulty of Paying Living Expenses: Not hard at all  Food Insecurity: No Food Insecurity (04/09/2023)   Hunger Vital Sign    Worried About Running Out of Food in the Last Year: Never true    Ran Out of Food in the Last Year: Never true  Transportation Needs: No Transportation Needs (04/09/2023)   PRAPARE - Administrator, Civil Service (Medical): No    Lack of Transportation (Non-Medical): No  Physical Activity: Insufficiently Active (07/11/2022)   Exercise Vital Sign    Days of Exercise per Week: 3 days    Minutes of Exercise per Session: 30 min  Stress: No Stress Concern Present (07/11/2022)   [name] - Occupational Stress Questionnaire    Feeling of Stress : Not at all  Social Connections: Patient Declined (04/10/2023)   Social Connection and Isolation Panel [NHANES]    Frequency of Communication with Friends and Family: Patient declined    Frequency of Social Gatherings with Friends and Family: Patient declined    Attends Religious Services: Patient declined    Database administrator or Organizations: Patient declined    Attends Banker  Meetings: Patient declined    Marital Status: Patient declined  Recent Concern: Social Connections - Socially Isolated (04/03/2023)   Social Connection and Isolation Panel [NHANES]    Frequency of Communication with Friends and Family: More than three times a week    Frequency of Social Gatherings with Friends and Family: More than three times a week    Attends Religious Services: Never    Database administrator or Organizations: No    Attends Banker Meetings: Never    Marital Status: Widowed     Family History: The patient's family history includes Breast  cancer in her paternal grandmother; Diabetes in her mother; Gallbladder disease in her mother and another family member; Heart disease in her father and mother; Hyperlipidemia in her brother; Hypertension in her brother, child, and mother; Pneumonia in her sister.  ROS:   Please see the history of present illness.     All other systems reviewed and are negative.  EKGs/Labs/Other Studies Reviewed:    The following studies were reviewed today:   EKG:  EKG is ordered today.  The ekg ordered today demonstrates NSR, 78 bpm, nonspecific ST/T wave segment changes, otherwise nothing acute.   Echocardiogram on 09/08/2014: Study Conclusions   - Left ventricle: The cavity size was normal. Wall thickness was    normal. Systolic function was normal. The estimated ejection    fraction was in the range of 60% to 65%. Wall motion was normal;    there were no regional wall motion abnormalities. Doppler    parameters are consistent with abnormal left ventricular    relaxation (grade 1 diastolic dysfunction).  - Aortic valve: Mildly calcified annulus. Trileaflet.  - Mitral valve: Mildly calcified annulus. Mildly calcified leaflets    . There was mild regurgitation.  - Tricuspid valve: There was mild regurgitation.   Lexiscan  on 06/08/2014: IMPRESSION: 1. No reversible ischemia or infarction.   2. Normal left ventricular wall  motion.   3. Left ventricular ejection fraction 57%   4. Low-risk stress test findings*.   Recent Labs: 04/09/2023: B Natriuretic Peptide 110.0 04/16/2023: ALT 8; BUN 14; Creatinine, Ser 0.98; Magnesium  2.1; Potassium 5.3; Sodium 135 05/26/2023: Hemoglobin 12.6; Platelets 280  Recent Lipid Panel    Component Value Date/Time   CHOL 151 04/03/2023 0421   CHOL 200 (H) 04/19/2022 1136   TRIG 77 04/03/2023 0421   HDL 56 04/03/2023 0421   HDL 59 04/19/2022 1136   CHOLHDL 2.7 04/03/2023 0421   VLDL 15 04/03/2023 0421   LDLCALC 80 04/03/2023 0421   LDLCALC 110 (H) 04/19/2022 1136    Physical Exam:    VS:  There were no vitals taken for this visit.    Wt Readings from Last 3 Encounters:  07/23/23 116 lb 12.8 oz (53 kg)  07/18/23 119 lb 0.8 oz (54 kg)  07/14/23 118 lb (53.5 kg)     GEN:  Well nourished, well developed in no acute distress HEENT: Normal NECK: No JVD; No carotid bruits CARDIAC: S1/S2, RRR, no murmurs, rubs, gallops RESPIRATORY:  Clear to auscultation without rales, wheezing or rhonchi  MUSCULOSKELETAL:  No edema; No deformity  SKIN: Warm and dry NEUROLOGIC:  Alert and oriented x 3 PSYCHIATRIC:  Normal affect   ASSESSMENT:    No diagnosis found.  PLAN:    In order of problems listed above:  Pre-operative Cardiovascular Risk Assessment Kaitlyn. Roach's perioperative risk of a major cardiac event is  % according to the Revised Cardiac Risk Index (RCRI).  Therefore, she is at high risk for perioperative complications.   Her functional capacity is fair at   METs according to the Duke Activity Status Index (DASI). Recommendations: According to ACC/AHA guidelines, no further cardiovascular testing needed.  The patient may proceed to surgery at acceptable risk.   Antiplatelet and/or Anticoagulation Recommendations: Patient is not on any antiplatelet or anticoagulation that needs to be held prior to surgery. Does not require SBE prophylaxis. Discussed case with Dr.  Armida Lander who agrees that patient may proceed to surgery. Patient was made aware of RCRI risk and she verbalized understanding and was  agreeable to proceed. Will route note to requesting party.   2. CAD Stable with no anginal symptoms. No indication for ischemic evaluation. Continue current medication regimen. Heart healthy diet and regular cardiovascular exercise encouraged.   3. HTN BP stable today. BP well controlled at home. Discussed to monitor BP at home at least 2 hours after medications and sitting for 5-10 minutes. Heart healthy diet and regular cardiovascular exercise encouraged.   4. HLD LDL 1 year ago 157. Not at goal. Will repeat lipid panel. If LDL is not at goal, will discuss referral to Lipid Clinic as she has hx of statin intolerance. Heart healthy diet and regular cardiovascular exercise encouraged.   5. Palpitations Denies any palpitations. Continue current meds. Heart healthy diet and regular cardiovascular exercise encouraged.   6. Disposition: Follow-up with Dr. Armida Lander in 6 months or sooner if anything changes.    Medication Adjustments/Labs and Tests Ordered: Current medicines are reviewed at length with the patient today.  Concerns regarding medicines are outlined above.  No orders of the defined types were placed in this encounter.  No orders of the defined types were placed in this encounter.   There are no Patient Instructions on file for this visit.   Signed, Lasalle Pointer, NP  07/24/2023 1:07 PM    [facility]

## 2023-07-25 ENCOUNTER — Telehealth: Payer: Self-pay

## 2023-07-25 ENCOUNTER — Telehealth: Payer: Self-pay | Admitting: Family Medicine

## 2023-07-25 MED ORDER — SULFAMETHOXAZOLE-TRIMETHOPRIM 800-160 MG PO TABS: 1.0000 | ORAL_TABLET | Freq: Two times a day (BID) | ORAL | 0 refills | Status: DC

## 2023-07-25 NOTE — Telephone Encounter (Signed)
 Left message advising abx sent in to get her till seen in office and to call back with any further questions or concerns.

## 2023-07-25 NOTE — Telephone Encounter (Signed)
 Copied from CRM [id number]. Topic: Clinical - Medication Question >> Jul 25, 2023  8:59 AM Everette C wrote: Reason for CRM: The patient's daughter has called to follow up on the status of previously discussed prescriptions for antibiotics and would like to know when they'll potentially be available for pick up at the pharmacy. Please contact further if/when possible

## 2023-07-25 NOTE — Telephone Encounter (Signed)
 Please see previous telephone encounter. Multiple calls regarding this.

## 2023-07-25 NOTE — Telephone Encounter (Signed)
 I don't see anything in your notes regarding antibiotics?

## 2023-07-25 NOTE — Telephone Encounter (Signed)
 Copied from CRM [id number]. Topic: Clinical - Prescription Issue >> Jul 25, 2023  9:34 AM Crispin Dolphin wrote: Reason for CRM: Patient daughter called. States patient was recently seen and provider was supposed to send medication to pharmacy for arm. More antibiotics. Nothing was sent in. Appt is wed but she wold either like medication sent in or a appt for today. Thank You

## 2023-07-29 ENCOUNTER — Other Ambulatory Visit: Payer: Self-pay | Admitting: Cardiology

## 2023-07-30 ENCOUNTER — Encounter: Payer: Self-pay | Admitting: Family Medicine

## 2023-07-30 ENCOUNTER — Ambulatory Visit: Admitting: Family Medicine

## 2023-07-30 VITALS — BP 100/67 | HR 66 | Temp 98.3°F | Ht 62.0 in | Wt 116.0 lb

## 2023-07-30 DIAGNOSIS — F03B11 Unspecified dementia, moderate, with agitation: Secondary | ICD-10-CM | POA: Diagnosis not present

## 2023-07-30 DIAGNOSIS — F5101 Primary insomnia: Secondary | ICD-10-CM

## 2023-07-30 DIAGNOSIS — S51812D Laceration without foreign body of left forearm, subsequent encounter: Secondary | ICD-10-CM | POA: Diagnosis not present

## 2023-07-30 DIAGNOSIS — F331 Major depressive disorder, recurrent, moderate: Secondary | ICD-10-CM

## 2023-07-30 DIAGNOSIS — K219 Gastro-esophageal reflux disease without esophagitis: Secondary | ICD-10-CM | POA: Diagnosis not present

## 2023-07-30 DIAGNOSIS — S51011D Laceration without foreign body of right elbow, subsequent encounter: Secondary | ICD-10-CM

## 2023-07-30 DIAGNOSIS — F411 Generalized anxiety disorder: Secondary | ICD-10-CM

## 2023-07-30 MED ORDER — POLYETHYLENE GLYCOL 3350 17 G PO PACK: 17.0000 g | PACK | Freq: Every day | ORAL | 99 refills | Status: AC | PRN

## 2023-07-30 MED ORDER — PANTOPRAZOLE SODIUM 40 MG PO TBEC: 40.0000 mg | DELAYED_RELEASE_TABLET | Freq: Two times a day (BID) | ORAL | 3 refills | Status: DC

## 2023-07-30 MED ORDER — SERTRALINE HCL 100 MG PO TABS: 100.0000 mg | ORAL_TABLET | Freq: Every day | ORAL | 3 refills | Status: AC

## 2023-07-30 MED ORDER — RAMELTEON 8 MG PO TABS: 8.0000 mg | ORAL_TABLET | Freq: Every day | ORAL | 3 refills | Status: DC

## 2023-07-30 NOTE — Progress Notes (Signed)
 Subjective: CC: Follow-up fall, dementia PCP: Eliodoro Guerin, DO [id number] T Rutherford Cowing is a 83 y.o. female presenting to clinic today for:  1.  Fall/dementia with mood disturbance and insomnia She sustained a fall about a week ago that resulted in trauma to the left side of the face, right elbow and left forearm.  She is accompanied today by her grandson Aletha Hutching who notes that her symptoms of dementia seem to be somewhat progressive.  She is having worse time sleeping and the melatonin that she was taking at bedtime is no longer helping.  She also seems to have more mood instability and the sertraline  50 mg does not seem to be sufficient.  They are amenable to referral to neurology at this point as the Aricept  that was added previously also does not seem to be as efficacious as a hoped.  She does have 24/7 care.   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 family unaware   Dysphagia    Dyspnea    Generalized weakness    GERD (gastroesophageal reflux disease)    GERD (gastroesophageal reflux disease)    High cholesterol    Hoarseness    Hx of cardiovascular stress test    negative 2012 and 2016   Hypertension    Insomnia    Intervertebral disc disorder with myelopathy, lumbar region    Lumbago    Lumbar spine pain    Non-ST elevation (NSTEMI) myocardial infarction (HCC) 04/03/2023   PONV (postoperative nausea and vomiting)    confusion after anesthesia   Renal insufficiency    Ringing in ears    resolved   Spondylosis with  myelopathy, lumbar region    legs, back   Syncope and collapse 04/09/2023    Current Outpatient Medications:    aspirin  EC 81 MG tablet, Take 81 mg by mouth daily. Swallow whole., Disp: , Rfl:    busPIRone  (BUSPAR ) 10 MG tablet, Take 1 tablet (10 mg total) by mouth 3 (three) times daily., Disp: 270 tablet, Rfl: 0   donepezil  (ARICEPT ) 10 MG tablet, Take one tablet by mouth daily at 9pm bedtime for Dementia **NEEDS TO BE SEEN BEFORE NEXT REFILL**, Disp: 30 tablet, Rfl: 0   ezetimibe  (ZETIA ) 10 MG tablet, TAKE ONE TABLET BY MOUTH DAILY AT 9AM.  Please call [phone] to schedule an overdue appointment for future refills. Thank you. 1st attempt., Disp: 30 tablet, Rfl: 0   HYDROcodone -acetaminophen  (NORCO) 10-325 MG tablet, Take 1 tablet by mouth every 4 (four) hours as needed for severe pain (pain score 7-10)., Disp: , Rfl:    losartan  (COZAAR ) 100 MG tablet, Take 1 tablet (100 mg total) by mouth daily., Disp: 30 tablet, Rfl: 0   Melatonin ER 5 MG TBCR, Take 5 mg by mouth at bedtime as needed (sleep)., Disp: 90 tablet, Rfl: 4   metoprolol  succinate (TOPROL  XL) 25 MG 24 hr tablet, Take 1 tablet (25 mg total) by mouth daily., Disp: 90 tablet, Rfl: 3  pantoprazole  (PROTONIX ) 40 MG tablet, Take 1 tablet (40 mg total) by mouth 2 (two) times daily., Disp: 180 tablet, Rfl: 3   polyethylene glycol (MIRALAX  / GLYCOLAX ) 17 g packet, Take 17 g by mouth 2 (two) times daily. (Patient taking differently: Take 17 g by mouth daily as needed for mild constipation.), Disp: 14 each, Rfl: 0   sertraline  (ZOLOFT ) 50 MG tablet, Take 1 tablet (50 mg total) by mouth daily., Disp: 90 tablet, Rfl: 0   sulfamethoxazole -trimethoprim  (BACTRIM  DS) 800-160 MG tablet, Take 1 tablet by mouth 2 (two) times daily., Disp: 10 tablet, Rfl: 0 Social History   Socioeconomic History   Marital status: Divorced    Spouse name: Not on file   Number of children: 2   Years of education: Not on file   Highest education level: Not on file   Occupational History   Not on file  Tobacco Use   Smoking status: Former    Current packs/day: 0.00    Average packs/day: 4.0 packs/day for 3.7 years (14.8 ttl pk-yrs)    Types: Cigarettes    Start date: 06/10/1960    Quit date: 02/20/1964    Years since quitting: 59.4   Smokeless tobacco: Never  Vaping Use   Vaping status: Never Used  Substance and Sexual Activity   Alcohol use: No    Alcohol/week: 0.0 standard drinks of alcohol   Drug use: No   Sexual activity: Not Currently    Birth control/protection: None  Other Topics Concern   Not on file  Social History Narrative   ** Merged History Encounter **       ** Merged History Encounter **  Ms Rutherford Cowing lives alone. She is separated/divorced from her second husband and widowed by her first husband. She has twin daughters, [name] and [name]. [name] has been a cause of stress for her and she believes was preven   ting her from getting medical care that she needed. She is no longer talking to [facility]. She states that [name] is involved in her care and that she trusts her to have her best interest in mind.      Social Drivers of Corporate investment banker Strain: Low Risk  (07/11/2022)   Overall Financial Resource Strain (CARDIA)    Difficulty of Paying Living Expenses: Not hard at all  Food Insecurity: No Food Insecurity (04/09/2023)   Hunger Vital Sign    Worried About Running Out of Food in the Last Year: Never true    Ran Out of Food in the Last Year: Never true  Transportation Needs: No Transportation Needs (04/09/2023)   PRAPARE - Administrator, Civil Service (Medical): No    Lack of Transportation (Non-Medical): No  Physical Activity: Insufficiently Active (07/11/2022)   Exercise Vital Sign    Days of Exercise per Week: 3 days    Minutes of Exercise per Session: 30 min  Stress: No Stress Concern Present (07/11/2022)   [name] - Occupational Stress Questionnaire    Feeling of  Stress : Not at all  Social Connections: Patient Declined (04/10/2023)   Social Connection and Isolation Panel [NHANES]    Frequency of Communication with Friends and Family: Patient declined    Frequency of Social Gatherings with Friends and Family: Patient declined    Attends Religious Services: Patient declined    Active Member of Clubs or Organizations: Patient declined    Attends Banker Meetings: Patient declined    Marital  Status: Patient declined  Recent Concern: Social Connections - Socially Isolated (04/03/2023)   Social Connection and Isolation Panel [NHANES]    Frequency of Communication with Friends and Family: More than three times a week    Frequency of Social Gatherings with Friends and Family: More than three times a week    Attends Religious Services: Never    Database administrator or Organizations: No    Attends Banker Meetings: Never    Marital Status: Widowed  Intimate Partner Violence: Not At Risk (04/09/2023)   Humiliation, Afraid, Rape, and Kick questionnaire    Fear of Current or Ex-Partner: No    Emotionally Abused: No    Physically Abused: No    Sexually Abused: No   Family History  Problem Relation Age of Onset   Diabetes Mother    Heart disease Mother    Hypertension Mother    Gallbladder disease Mother    Heart disease Father    Pneumonia Sister    Breast cancer Paternal Grandmother    Hypertension Brother    Hyperlipidemia Brother    Hypertension Child    Gallbladder disease Other     Objective: Office vital signs reviewed. BP 100/67   Pulse 66   Temp 98.3 F (36.8 C)   Ht 5\' 2"  (1.575 m)   Wt 116 lb (52.6 kg)   SpO2 97%   BMI 21.22 kg/m   Physical Examination:  General: Awake, alert, nontoxic elderly female, No acute distress Skin: Large, deep skin tear noted along the left forearm.  No purulent discharge or bleeding.  Some serosanguineous fluid appreciated.  No surrounding erythema or induration.  She has a  healing right elbow wound without evidence of complication.  She has healing ecchymosis along the left side of her face Psych: Mood stable, speech normal.  Patient is pleasant and interactive during visit Neuro: Interacts with provider     07/30/2023   10:12 AM 07/14/2023    2:55 PM 05/26/2023    9:59 AM  Depression screen PHQ 2/9  Decreased Interest 0 2 1  Down, Depressed, Hopeless 1 3 0  PHQ - 2 Score 1 5 1   Altered sleeping 1 3   Tired, decreased energy 1 1   Change in appetite 1 1   Feeling bad or failure about yourself  0 1   Trouble concentrating 0 3   Moving slowly or fidgety/restless 0 0   Suicidal thoughts 0 0   PHQ-9 Score 4 14   Difficult doing work/chores Not difficult at all Extremely dIfficult       07/30/2023   10:11 AM 07/14/2023    2:56 PM 05/08/2023    9:47 AM 04/16/2023   11:08 AM  GAD 7 : Generalized Anxiety Score  Nervous, Anxious, on Edge 1 3 3 3   Control/stop worrying 1 3 2 2   Worry too much - different things 1 3 2 2   Trouble relaxing 0 3 2 2   Restless 0 3 2 2   Easily annoyed or irritable 0 3 2 2   Afraid - awful might happen 0 3 2 2   Total GAD 7 Score 3 21 15 15   Anxiety Difficulty Not difficult at all Extremely difficult Somewhat difficult Somewhat difficult       07/30/2023   10:24 AM 08/09/2021    3:20 PM 12/17/2019    3:32 PM  MMSE - Mini Mental State Exam  Orientation to time 2 3 5   Orientation to Place 2 0 5  Registration 3 3 3   Attention/ Calculation 0 5 2  Recall 2 0 2  Language- name 2 objects 2 2 2   Language- repeat 1 1 1   Language- follow 3 step command 3 3 3   Language- read & follow direction 1 1 1   Write a sentence 0 1 1  Copy design 1 0 1  Total score 17 19 26    Assessment/ Plan: 83 y.o. female   Moderate dementia with agitation, unspecified dementia type (HCC) - Plan: ramelteon (ROZEREM) 8 MG tablet, Ambulatory referral to Neurology  Skin tear of right elbow without complication, subsequent encounter  Skin tear of left  forearm without complication, subsequent encounter  Primary insomnia - Plan: ramelteon (ROZEREM) 8 MG tablet  Moderate episode of recurrent major depressive disorder (HCC) - Plan: sertraline  (ZOLOFT ) 100 MG tablet  GAD (generalized anxiety disorder) - Plan: sertraline  (ZOLOFT ) 100 MG tablet  Gastroesophageal reflux disease without esophagitis - Plan: pantoprazole  (PROTONIX ) 40 MG tablet  Will see if we can get ramelteon covered for her given what sounds like some intermittent delirium associated with dementia.  I am absolutely hesitant to utilize anything that is a controlled substance in this patient and would be reluctant to use doxepin  and/or trazodone  given need for SSRI.  I would did go ahead and advance her SSRI to 100 mg given reports of ongoing mood instability.  Referral to neurology placed today for help as well though given her MMSE score of 17 today I am not quite sure that pharmacologic interventions will be helpful at this point.  I dressed the left forearm wound today with Xeroform, Telfa and Coban.  Home care instructions were reviewed for ongoing wound care.  No evidence of secondary infection at this time  Did not discuss GERD but PPI was renewed today  Would like to see her back in the next couple of months to assess how the Zoloft  and ramelteon are working for her and hopefully to see if she has seen neurology at this point   Eliodoro Guerin, DO [facility] [phone]

## 2023-07-31 ENCOUNTER — Other Ambulatory Visit: Payer: Self-pay | Admitting: Family Medicine

## 2023-07-31 ENCOUNTER — Other Ambulatory Visit: Payer: Self-pay | Admitting: Nurse Practitioner

## 2023-08-05 ENCOUNTER — Ambulatory Visit

## 2023-08-06 DIAGNOSIS — Z599 Problem related to housing and economic circumstances, unspecified: Secondary | ICD-10-CM | POA: Diagnosis not present

## 2023-08-06 DIAGNOSIS — G894 Chronic pain syndrome: Secondary | ICD-10-CM | POA: Diagnosis not present

## 2023-08-06 DIAGNOSIS — Z5941 Food insecurity: Secondary | ICD-10-CM | POA: Diagnosis not present

## 2023-08-07 ENCOUNTER — Other Ambulatory Visit: Payer: Self-pay | Admitting: Nurse Practitioner

## 2023-08-08 ENCOUNTER — Other Ambulatory Visit: Payer: Self-pay | Admitting: Cardiology

## 2023-08-12 ENCOUNTER — Other Ambulatory Visit: Payer: Self-pay | Admitting: Family Medicine

## 2023-08-12 DIAGNOSIS — F411 Generalized anxiety disorder: Secondary | ICD-10-CM

## 2023-09-16 ENCOUNTER — Encounter: Payer: Self-pay | Admitting: Family Medicine

## 2023-09-16 ENCOUNTER — Other Ambulatory Visit (HOSPITAL_COMMUNITY): Payer: Self-pay

## 2023-09-16 ENCOUNTER — Ambulatory Visit

## 2023-09-16 ENCOUNTER — Ambulatory Visit (INDEPENDENT_AMBULATORY_CARE_PROVIDER_SITE_OTHER)

## 2023-09-16 ENCOUNTER — Other Ambulatory Visit: Payer: Self-pay

## 2023-09-16 ENCOUNTER — Other Ambulatory Visit: Payer: Self-pay | Admitting: Family Medicine

## 2023-09-16 ENCOUNTER — Ambulatory Visit: Payer: Self-pay | Admitting: Family Medicine

## 2023-09-16 ENCOUNTER — Ambulatory Visit: Admitting: Family Medicine

## 2023-09-16 VITALS — BP 166/87 | HR 73 | Temp 98.7°F | Ht 62.0 in | Wt 117.0 lb

## 2023-09-16 DIAGNOSIS — R0781 Pleurodynia: Secondary | ICD-10-CM | POA: Diagnosis not present

## 2023-09-16 DIAGNOSIS — F5101 Primary insomnia: Secondary | ICD-10-CM | POA: Diagnosis not present

## 2023-09-16 DIAGNOSIS — F331 Major depressive disorder, recurrent, moderate: Secondary | ICD-10-CM

## 2023-09-16 DIAGNOSIS — F03911 Unspecified dementia, unspecified severity, with agitation: Secondary | ICD-10-CM | POA: Insufficient documentation

## 2023-09-16 DIAGNOSIS — T148XXA Other injury of unspecified body region, initial encounter: Secondary | ICD-10-CM

## 2023-09-16 DIAGNOSIS — F03B11 Unspecified dementia, moderate, with agitation: Secondary | ICD-10-CM

## 2023-09-16 DIAGNOSIS — S2242XA Multiple fractures of ribs, left side, initial encounter for closed fracture: Secondary | ICD-10-CM | POA: Diagnosis not present

## 2023-09-16 DIAGNOSIS — F411 Generalized anxiety disorder: Secondary | ICD-10-CM | POA: Diagnosis not present

## 2023-09-16 DIAGNOSIS — Z780 Asymptomatic menopausal state: Secondary | ICD-10-CM

## 2023-09-16 DIAGNOSIS — Z9181 History of falling: Secondary | ICD-10-CM

## 2023-09-16 MED ORDER — LIDOCAINE 5 % EX PTCH: 1.0000 | MEDICATED_PATCH | Freq: Every day | CUTANEOUS | 0 refills | Status: DC | PRN

## 2023-09-16 MED ORDER — METHYLPREDNISOLONE ACETATE 40 MG/ML IJ SUSP
40.0000 mg | Freq: Once | INTRAMUSCULAR | Status: AC
  Administered 2023-09-16: 40 mg via INTRAMUSCULAR

## 2023-09-16 MED ORDER — BELSOMRA 5 MG PO TABS: 5.0000 mg | ORAL_TABLET | Freq: Every evening | ORAL | 0 refills | Status: DC | PRN

## 2023-09-16 MED ORDER — LOSARTAN POTASSIUM 100 MG PO TABS: 100.0000 mg | ORAL_TABLET | Freq: Every day | ORAL | 3 refills | Status: AC

## 2023-09-16 MED ORDER — BUSPIRONE HCL 10 MG PO TABS: 10.0000 mg | ORAL_TABLET | Freq: Three times a day (TID) | ORAL | 3 refills | Status: AC

## 2023-09-16 MED ORDER — DONEPEZIL HCL 10 MG PO TABS: ORAL_TABLET | ORAL | 1 refills | Status: DC

## 2023-09-16 NOTE — Patient Instructions (Signed)
 STOP Ramelteon . START Belsomra  for sleep. MAKE SURE that you have sertraline  100mg  at home.  That is the dose she should be using for her nerves/ depression  Rib Contusion A rib contusion is a deep bruise on the rib area. Contusions are the result of a blunt trauma that causes bleeding and injury to the tissues under the skin. A rib contusion may involve bruising of the ribs and of the skin and muscles in the area. The skin over the contusion may turn blue, purple, or yellow. Minor injuries result in a painless contusion. More severe contusions may be painful and swollen for a few weeks. What are the causes? This condition is usually caused by a hard, direct hit to an area of the body. This often occurs while playing contact sports. What are the signs or symptoms? Symptoms of this condition include: Swelling and redness of the injured area. Discoloration of the injured area. Tenderness and soreness of the injured area. Pain with or without movement. Pain when breathing in. How is this diagnosed? This condition may be diagnosed based on: Your symptoms and medical history. A physical exam. Imaging tests--such as an X-ray, CT scan, or MRI--to determine if there were internal injuries or broken bones (fractures). How is this treated? This condition may be treated with: Rest. This is often the best treatment for a rib contusion. Ice packs. This reduces swelling and inflammation. Deep-breathing exercises. These may be recommended to reduce the risk for lung collapse and pneumonia. Medicines. Over-the-counter or prescription medicines may be given to control pain. Injection of a numbing medicine around the nerve near your injury (nerve block). Follow these instructions at home: Medicines Take over-the-counter and prescription medicines only as told by your health care provider. Ask your health care provider if the medicine prescribed to you: Requires you to avoid driving or using  machinery. Can cause constipation. You may need to take these actions to prevent or treat constipation: Drink enough fluid to keep your urine pale yellow. Take over-the-counter or prescription medicines. Eat foods that are high in fiber, such as beans, whole grains, and fresh fruits and vegetables. Limit foods that are high in fat and processed sugars, such as fried or sweet foods. Managing pain, stiffness, and swelling If directed, put ice on the injured area. To do this: Put ice in a plastic bag. Place a towel between your skin and the bag. Leave the ice on for 20 minutes, 2-3 times a day. Remove the ice if your skin turns bright red. This is very important. If you cannot feel pain, heat, or cold, you have a greater risk of damage to the area.  Activity Rest the injured area. Avoid strenuous activity and any activities or movements that cause pain. Be careful during activities, and avoid bumping the injured area. Do not lift anything that is heavier than 5 lb (2.3 kg), or the limit that you are told, until your health care provider says that it is safe. General instructions  Do not use any products that contain nicotine or tobacco, such as cigarettes, e-cigarettes, and chewing tobacco. These can delay healing. If you need help quitting, ask your health care provider. Do deep-breathing exercises as told by your health care provider. If you were given an incentive spirometer, use it every 1-2 hours while you are awake, or as recommended by your health care provider. This device measures how well you are filling your lungs with each breath. Keep all follow-up visits. This is important. Contact a  health care provider if you have: Increased bruising or swelling. Pain that is not controlled with treatment. A fever. Get help right away if you: Have difficulty breathing or shortness of breath. Develop a continual cough, or you cough up thick or bloody mucus from your lungs (sputum). Feel  nauseous or you vomit. Have pain in your abdomen. These symptoms may represent a serious problem that is an emergency. Do not wait to see if the symptoms will go away. Get medical help right away. Call your local emergency services (911 in the [place] Do not drive yourself to the hospital. Summary A rib contusion is a deep bruise on your rib area. Contusions are the result of a blunt trauma that causes bleeding and injury to the tissues under the skin. The skin over the contusion may turn blue, purple, or yellow. Minor injuries may cause a painless contusion. More severe contusions may be painful and swollen for a few weeks. Rest the injured area. Avoid strenuous activity and any activities or movements that cause pain. This information is not intended to replace advice given to you by your health care provider. Make sure you discuss any questions you have with your health care provider. Document Revised: 12/30/2022 Document Reviewed: 12/30/2022 Elsevier Patient Education  2024 [employer].

## 2023-09-16 NOTE — Progress Notes (Signed)
 Subjective: [name] w/ GAD/ MDD PCP: Kaitlyn Norene HERO, DO YEP:Kaitlyn Serrano is a 83 y.o. female presenting to clinic today for:  1. Dementia with GAD/ MDD Zoloft  increased to 100mg , Ramelteon  8mg  added in June. Buspar  continued.  She is brought to the office by her daughter who notes that she never did receive the 100 mg of Zoloft  and has only been taking 50 mg.  She continues to have depression and anxiety that are not well-controlled.  They did trial the ramelteon  but did not find it to be helpful.  Still having episodes where she is pacing at nighttime and not sleeping.  Appointment with neurology is scheduled for October  2.  Fall She sustained a mechanical fall 3 days ago onto her left side and has now some pain and bruising on that side.  She reports pain with deep breathing.  No hemoptysis.   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 family unaware   Dysphagia    Dyspnea    Generalized weakness    GERD (gastroesophageal reflux disease)    GERD (gastroesophageal reflux disease)    High cholesterol    Hoarseness    Hx of cardiovascular stress test    negative 2012 and 2016   Hypertension    Insomnia    Intervertebral disc disorder with myelopathy, lumbar region    Lumbago    Lumbar spine pain    Non-ST elevation (NSTEMI) myocardial infarction (HCC) 04/03/2023   PONV (postoperative nausea and vomiting)    confusion after anesthesia   Renal insufficiency    Ringing in ears    resolved   Spondylosis with  myelopathy, lumbar region    legs, back   Syncope and collapse 04/09/2023    Current Outpatient Medications:    aspirin  EC 81 MG tablet, Take 81 mg by mouth daily. Swallow whole., Disp: , Rfl:    busPIRone  (BUSPAR ) 10 MG tablet, TAKE 1 TABLET BY MOUTH THREE TIMES A DAY, Disp: 270 tablet, Rfl: 0   donepezil  (ARICEPT ) 10 MG tablet, Take one tablet by mouth daily at 9pm bedtime for Dementia **NEEDS TO BE SEEN BEFORE NEXT REFILL**, Disp: 30 tablet, Rfl: 0   ezetimibe  (ZETIA ) 10 MG tablet, TAKE ONE TABLET BY MOUTH DAILY AT 9AM. NEEDS APPOINTMENT BEFORE FURTHER REFILLS, 2ND ATTEMPT, Disp: 90 tablet, Rfl: 3   HYDROcodone -acetaminophen  (NORCO) 10-325 MG tablet, Take 1 tablet by mouth every 4 (four) hours as needed for severe pain (pain score 7-10)., Disp: , Rfl:    losartan  (COZAAR ) 100 MG tablet, TAKE 1 TABLET BY MOUTH EVERY DAY, Disp: 90 tablet, Rfl: 0   Melatonin ER 5 MG TBCR, Take 5 mg by mouth at bedtime as needed (sleep)., Disp: 90 tablet, Rfl: 4   metoprolol  succinate (TOPROL  XL) 25 MG 24 hr tablet, Take 1 tablet (25 mg total) by mouth daily., Disp: 90 tablet, Rfl: 3   pantoprazole  (PROTONIX ) 40 MG tablet, Take 1 tablet (40 mg total) by mouth 2 (  two) times daily., Disp: 180 tablet, Rfl: 3   polyethylene glycol (MIRALAX  / GLYCOLAX ) 17 g packet, Take 17 g by mouth daily as needed for moderate constipation., Disp: 30 each, Rfl: PRN   ramelteon  (ROZEREM ) 8 MG tablet, Take 1 tablet (8 mg total) by mouth at bedtime. For sleep to replace melatonin, Disp: 90 tablet, Rfl: 3   sertraline  (ZOLOFT ) 100 MG tablet, Take 1 tablet (100 mg total) by mouth daily. **dose change, Disp: 90 tablet, Rfl: 3   sulfamethoxazole -trimethoprim  (BACTRIM  DS) 800-160 MG tablet, Take 1 tablet by mouth 2 (two) times daily., Disp: 10 tablet, Rfl: 0 Social History   Socioeconomic History   Marital status: Divorced    Spouse name: Not on file   Number of children: 2   Years of education: Not on file   Highest education level:  Not on file  Occupational History   Not on file  Tobacco Use   Smoking status: Former    Current packs/day: 0.00    Average packs/day: 4.0 packs/day for 3.7 years (14.8 ttl pk-yrs)    Types: Cigarettes    Start date: 06/10/1960    Quit date: 02/20/1964    Years since quitting: 59.6   Smokeless tobacco: Never  Vaping Use   Vaping status: Never Used  Substance and Sexual Activity   Alcohol use: No    Alcohol/week: 0.0 standard drinks of alcohol   Drug use: No   Sexual activity: Not Currently    Birth control/protection: None  Other Topics Concern   Not on file  Social History Narrative   ** Merged History Encounter **       ** Merged History Encounter **  [name] lives alone. She is separated/divorced from her second husband and widowed by her first husband. She has twin daughters, Kaitlyn Serrano and Kaitlyn Serrano. Kaitlyn Serrano has been a cause of stress for her and she believes was preven   ting her from getting medical care that she needed. She is no longer talking to [place]. She states that Kaitlyn Serrano is involved in her care and that she trusts her to have her best interest in mind.      Social Drivers of Health   Financial Resource Strain: Medium Risk (08/06/2023)   Received from [facility]   Overall Financial Resource Strain (CARDIA)    Difficulty of Paying Living Expenses: Somewhat hard  Food Insecurity: Food Insecurity Present (08/06/2023)   Received from [facility]   Hunger Vital Sign    Within the past 12 months, you worried that your food would run out before you got the money to buy more.: Sometimes true    Within the past 12 months, the food you bought just didn't last and you didn't have money to get more.: Sometimes true  Transportation Needs: No Transportation Needs (08/06/2023)   Received from [facility] - Transportation    Lack of Transportation (Medical): No    Lack of Transportation (Non-Medical): No  Physical Activity: Insufficiently Active (07/11/2022)    Exercise Vital Sign    Days of Exercise per Week: 3 days    Minutes of Exercise per Session: 30 min  Stress: No Stress Concern Present (07/11/2022)   [name] - Occupational Stress Questionnaire    Feeling of Stress : Not at all  Social Connections: Patient Declined (04/10/2023)   Social Connection and Isolation Panel    Frequency of Communication with Friends and Family: Patient declined    Frequency of Social  Gatherings with Friends and Family: Patient declined    Attends Religious Services: Patient declined    Active Member of Clubs or Organizations: Patient declined    Attends Banker Meetings: Patient declined    Marital Status: Patient declined  Recent Concern: Social Connections - Socially Isolated (04/03/2023)   Social Connection and Isolation Panel    Frequency of Communication with Friends and Family: More than three times a week    Frequency of Social Gatherings with Friends and Family: More than three times a week    Attends Religious Services: Never    Database administrator or Organizations: No    Attends Banker Meetings: Never    Marital Status: Widowed  Intimate Partner Violence: Not At Risk (04/09/2023)   Humiliation, Afraid, Rape, and Kick questionnaire    Fear of Current or Ex-Partner: No    Emotionally Abused: No    Physically Abused: No    Sexually Abused: No   Family History  Problem Relation Age of Onset   Diabetes Mother    Heart disease Mother    Hypertension Mother    Gallbladder disease Mother    Heart disease Father    Pneumonia Sister    Breast cancer Paternal Grandmother    Hypertension Brother    Hyperlipidemia Brother    Hypertension Child    Gallbladder disease Other     Objective: Office vital signs reviewed. BP (!) 166/87   Pulse 73   Temp 98.7 F (37.1 C)   Ht 5' 2 (1.575 m)   Wt 117 lb (53.1 kg)   SpO2 97%   BMI 21.40 kg/m   Physical Examination:  General: Awake, alert,  nontoxic elderly female, No acute distress HEENT: Sclera white.  Moist mucous membranes Cardio: regular rate and rhythm, S1S2 heard, no murmurs appreciated Pulm: clear to auscultation bilaterally, no wheezes, rhonchi or rales; normal work of breathing on room air but splinting with deep breathing MSK: She has a large area of ecchymosis along the left lower posterior ribs with associated hematoma.  There is tenderness.  No skin breakdown.  Assessment/ Plan: 83 y.o. female   Moderate dementia with agitation, unspecified dementia type (HCC) - Plan: donepezil  (ARICEPT ) 10 MG tablet  Moderate episode of recurrent major depressive disorder (HCC)  GAD (generalized anxiety disorder) - Plan: busPIRone  (BUSPAR ) 10 MG tablet  Primary insomnia - Plan: Suvorexant  (BELSOMRA ) 5 MG TABS  Hematoma - Plan: methylPREDNISolone  acetate (DEPO-MEDROL ) injection 40 mg, DG Ribs Unilateral W/Chest Left, lidocaine  (LIDODERM ) 5 %  Rib pain on left side - Plan: methylPREDNISolone  acetate (DEPO-MEDROL ) injection 40 mg, DG Ribs Unilateral W/Chest Left, lidocaine  (LIDODERM ) 5 %  Keep appointment with neurology.  Aricept  renewed for now  Reiterated that she should be on Zoloft  100 mg.  At this that the daughter make sure that she gets that medication.  BuSpar  renewed  Okay to discontinue ramelteon .  Will trial on low-dose Belsomra  and reevaluate in 1 month and advance dose if needed at that time.  The national narcotic database was reviewed with no red flags  Lidoderm  patch ordered, Depo-Medrol  given.  She has home pain medication.  Plain films of the ribs demonstrated some old rib fractures in the upper rib cage but in the area that she actually had the injury I could not appreciate any discrete fractures due to some artifact.  I am awaiting formal review by radiology   Norene CHRISTELLA Fielding, DO [facility] [phone]

## 2023-09-20 ENCOUNTER — Emergency Department (HOSPITAL_COMMUNITY): Admission: EM | Admit: 2023-09-20 | Discharge: 2023-09-20 | Disposition: A | Discharge status: Home / Self Care

## 2023-09-20 ENCOUNTER — Other Ambulatory Visit: Payer: Self-pay

## 2023-09-20 ENCOUNTER — Emergency Department (HOSPITAL_COMMUNITY)

## 2023-09-20 ENCOUNTER — Encounter (HOSPITAL_COMMUNITY): Payer: Self-pay | Admitting: Emergency Medicine

## 2023-09-20 DIAGNOSIS — G8929 Other chronic pain: Secondary | ICD-10-CM | POA: Insufficient documentation

## 2023-09-20 DIAGNOSIS — F05 Delirium due to known physiological condition: Secondary | ICD-10-CM | POA: Diagnosis not present

## 2023-09-20 DIAGNOSIS — Z8659 Personal history of other mental and behavioral disorders: Secondary | ICD-10-CM

## 2023-09-20 DIAGNOSIS — Z7982 Long term (current) use of aspirin: Secondary | ICD-10-CM | POA: Diagnosis not present

## 2023-09-20 DIAGNOSIS — F0390 Unspecified dementia without behavioral disturbance: Secondary | ICD-10-CM | POA: Insufficient documentation

## 2023-09-20 DIAGNOSIS — M545 Low back pain, unspecified: Secondary | ICD-10-CM | POA: Insufficient documentation

## 2023-09-20 DIAGNOSIS — I700 Atherosclerosis of aorta: Secondary | ICD-10-CM | POA: Diagnosis not present

## 2023-09-20 DIAGNOSIS — M549 Dorsalgia, unspecified: Secondary | ICD-10-CM | POA: Diagnosis not present

## 2023-09-20 DIAGNOSIS — I10 Essential (primary) hypertension: Secondary | ICD-10-CM | POA: Diagnosis not present

## 2023-09-20 DIAGNOSIS — R0602 Shortness of breath: Secondary | ICD-10-CM | POA: Diagnosis not present

## 2023-09-20 DIAGNOSIS — M8580 Other specified disorders of bone density and structure, unspecified site: Secondary | ICD-10-CM | POA: Diagnosis not present

## 2023-09-20 DIAGNOSIS — S2242XD Multiple fractures of ribs, left side, subsequent encounter for fracture with routine healing: Secondary | ICD-10-CM | POA: Diagnosis not present

## 2023-09-20 LAB — URINALYSIS, ROUTINE W REFLEX MICROSCOPIC
Bacteria, UA: NONE SEEN
Bilirubin Urine: NEGATIVE
Glucose, UA: NEGATIVE mg/dL
Hgb urine dipstick: NEGATIVE
Ketones, ur: 5 mg/dL — AB
Nitrite: NEGATIVE
Protein, ur: 30 mg/dL — AB
Specific Gravity, Urine: 1.026 (ref 1.005–1.030)
pH: 5 (ref 5.0–8.0)

## 2023-09-20 LAB — CBC
HCT: 41.5 % (ref 36.0–46.0)
Hemoglobin: 13.9 g/dL (ref 12.0–15.0)
MCH: 29.4 pg (ref 26.0–34.0)
MCHC: 33.5 g/dL (ref 30.0–36.0)
MCV: 87.9 fL (ref 80.0–100.0)
Platelets: 343 K/uL (ref 150–400)
RBC: 4.72 MIL/uL (ref 3.87–5.11)
RDW: 14.1 % (ref 11.5–15.5)
WBC: 12.4 K/uL — ABNORMAL HIGH (ref 4.0–10.5)
nRBC: 0 % (ref 0.0–0.2)

## 2023-09-20 LAB — COMPREHENSIVE METABOLIC PANEL WITH GFR
ALT: 13 U/L (ref 0–44)
AST: 39 U/L (ref 15–41)
Albumin: 4.5 g/dL (ref 3.5–5.0)
Alkaline Phosphatase: 111 U/L (ref 38–126)
Anion gap: 14 (ref 5–15)
BUN: 18 mg/dL (ref 8–23)
CO2: 24 mmol/L (ref 22–32)
Calcium: 9.9 mg/dL (ref 8.9–10.3)
Chloride: 101 mmol/L (ref 98–111)
Creatinine, Ser: 1.36 mg/dL — ABNORMAL HIGH (ref 0.44–1.00)
GFR, Estimated: 39 mL/min — ABNORMAL LOW (ref >60)
Glucose, Bld: 110 mg/dL — ABNORMAL HIGH (ref 70–99)
Potassium: 4.1 mmol/L (ref 3.5–5.1)
Sodium: 139 mmol/L (ref 135–145)
Total Bilirubin: 0.8 mg/dL (ref 0.0–1.2)
Total Protein: 8.1 g/dL (ref 6.5–8.1)

## 2023-09-20 LAB — CBG MONITORING, ED: Glucose-Capillary: 120 mg/dL — ABNORMAL HIGH (ref 70–99)

## 2023-09-20 MED ORDER — ACETAMINOPHEN 500 MG PO TABS
1000.0000 mg | ORAL_TABLET | Freq: Once | ORAL | Status: AC
  Administered 2023-09-20: 1000 mg via ORAL
  Filled 2023-09-20: qty 2
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
## 2023-09-20 NOTE — ED Notes (Signed)
 CBG 120

## 2023-09-20 NOTE — Discharge Instructions (Signed)
 You can take Tylenol  and Motrin as needed for pain.  Follow-up with your primary care doctor next week.  Call the office Monday to make an appointment.

## 2023-09-29 ENCOUNTER — Encounter: Payer: Self-pay | Admitting: Family Medicine

## 2023-09-29 ENCOUNTER — Ambulatory Visit: Admitting: Family Medicine

## 2023-09-29 NOTE — Progress Notes (Deleted)
 Subjective: CC:*** PCP: [name], DO YEP:Dypmozb T Rothgeb is a 83 y.o. female presenting to clinic today for:  1.  Insomnia Patient was started on Belsomra  5 mg about 2 weeks ago for insomnia that was refractory to multiple agents ***   ROS: Per HPI  Allergies  Allergen Reactions   Bee Venom Anaphylaxis   Penicillins Anaphylaxis and Rash   Amlodipine  Swelling   Elavil  [Amitriptyline  Hcl] Other (See Comments)    Confusion, hallucinations   Statins Swelling and Other (See Comments)    Peeling of skin   Past Medical History:  Diagnosis Date   Acute systolic heart failure (HCC) 04/03/2023   Anxiety    Benzodiazepine dependence (HCC) 12/30/2017   Chronic pain syndrome    Complication of anesthesia    Depression    Diverticulosis    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 family unaware   Dysphagia    Dyspnea    Generalized weakness    GERD (gastroesophageal reflux disease)    GERD (gastroesophageal reflux disease)    High cholesterol    Hoarseness    Hx of cardiovascular stress test    negative 2012 and 2016   Hypertension    Insomnia    Intervertebral disc disorder with myelopathy, lumbar region    Lumbago    Lumbar spine pain    Non-ST elevation (NSTEMI) myocardial infarction (HCC) 04/03/2023   PONV (postoperative nausea and vomiting)    confusion after anesthesia   Renal insufficiency    Ringing in ears    resolved   Spondylosis with myelopathy, lumbar region    legs, back   Syncope and collapse 04/09/2023    Current Outpatient Medications:    aspirin  EC 81 MG tablet, Take 81 mg by mouth daily. Swallow whole., Disp: , Rfl:    busPIRone  (BUSPAR ) 10 MG tablet, Take 1 tablet (10 mg total) by mouth 3 (three) times daily., Disp: 270 tablet, Rfl: 3   donepezil  (ARICEPT ) 10 MG tablet, Take one tablet by mouth daily at 9pm bedtime for Dementia, Disp: 90 tablet, Rfl: 1   ezetimibe  (ZETIA ) 10 MG tablet, TAKE ONE TABLET BY MOUTH DAILY AT 9AM. NEEDS  APPOINTMENT BEFORE FURTHER REFILLS, 2ND ATTEMPT, Disp: 90 tablet, Rfl: 3   HYDROcodone -acetaminophen  (NORCO) 10-325 MG tablet, Take 1 tablet by mouth every 4 (four) hours as needed for severe pain (pain score 7-10)., Disp: , Rfl:    lidocaine  (LIDODERM ) 5 %, Place 1 patch onto the skin daily as needed (pain). Remove & Discard patch within 12 hours or as directed by MD, Disp: 30 patch, Rfl: 0   losartan  (COZAAR ) 100 MG tablet, Take 1 tablet (100 mg total) by mouth daily., Disp: 90 tablet, Rfl: 3   metoprolol  succinate (TOPROL  XL) 25 MG 24 hr tablet, Take 1 tablet (25 mg total) by mouth daily., Disp: 90 tablet, Rfl: 3   pantoprazole  (PROTONIX ) 40 MG tablet, Take 1 tablet (40 mg total) by mouth 2 (two) times daily., Disp: 180 tablet, Rfl: 3   polyethylene glycol (MIRALAX  / GLYCOLAX ) 17 g packet, Take 17 g by mouth daily as needed for moderate constipation., Disp: 30 each, Rfl: PRN   sertraline  (ZOLOFT ) 100 MG tablet, Take 1 tablet (100 mg total) by mouth daily. **dose change, Disp: 90 tablet, Rfl: 3   Suvorexant  (BELSOMRA ) 5 MG TABS, Take 1 tablet (5 mg total) by mouth at bedtime as needed., Disp: 30 tablet, Rfl: 0 Social History   Socioeconomic History   Marital status:  Divorced    Spouse name: Not on file   Number of children: 2   Years of education: Not on file   Highest education level: Not on file  Occupational History   Not on file  Tobacco Use   Smoking status: Former    Current packs/day: 0.00    Average packs/day: 4.0 packs/day for 3.7 years (14.8 ttl pk-yrs)    Types: Cigarettes    Start date: 06/10/1960    Quit date: 02/20/1964    Years since quitting: 59.6   Smokeless tobacco: Never  Vaping Use   Vaping status: Never Used  Substance and Sexual Activity   Alcohol use: No    Alcohol/week: 0.0 standard drinks of alcohol   Drug use: No   Sexual activity: Not Currently    Birth control/protection: None  Other Topics Concern   Not on file  Social History Narrative   ** Merged  History Encounter **       ** Merged History Encounter **  Ms Merle lives alone. She is separated/divorced from her second husband and widowed by her first husband. She has twin daughters, Zebedee and Reena. Zebedee has been a cause of stress for her and she believes was preven   ting her from getting medical care that she needed. She is no longer talking to [place]. She states that Reena is involved in her care and that she trusts her to have her best interest in mind.      Social Drivers of Health   Financial Resource Strain: Medium Risk (08/06/2023)   Received from [facility]   Overall Financial Resource Strain (CARDIA)    Difficulty of Paying Living Expenses: Somewhat hard  Food Insecurity: Food Insecurity Present (08/06/2023)   Received from [facility]   Hunger Vital Sign    Within the past 12 months, you worried that your food would run out before you got the money to buy more.: Sometimes true    Within the past 12 months, the food you bought just didn't last and you didn't have money to get more.: Sometimes true  Transportation Needs: No Transportation Needs (08/06/2023)   Received from [facility] - [facility]    Lack of Transportation (Medical): No    Lack of Transportation (Non-Medical): No  Physical Activity: Insufficiently Active (07/11/2022)   Exercise Vital Sign    Days of Exercise per Week: 3 days    Minutes of Exercise per Session: 30 min  Stress: No Stress Concern Present (07/11/2022)   [name] - Occupational Stress Questionnaire    Feeling of Stress : Not at all  Social Connections: Patient Declined (04/10/2023)   Social Connection and Isolation Panel    Frequency of Communication with Friends and Family: Patient declined    Frequency of Social Gatherings with Friends and Family: Patient declined    Attends Religious Services: Patient declined    Database administrator or Organizations: Patient declined     Attends Banker Meetings: Patient declined    Marital Status: Patient declined  Recent Concern: Social Connections - Socially Isolated (04/03/2023)   Social Connection and Isolation Panel    Frequency of Communication with Friends and Family: More than three times a week    Frequency of Social Gatherings with Friends and Family: More than three times a week    Attends Religious Services: Never    Database administrator or Organizations: No    Attends Banker  Meetings: Never    Marital Status: Widowed  Intimate Partner Violence: Not At Risk (04/09/2023)   Humiliation, Afraid, Rape, and Kick questionnaire    Fear of Current or Ex-Partner: No    Emotionally Abused: No    Physically Abused: No    Sexually Abused: No   Family History  Problem Relation Age of Onset   Diabetes Mother    Heart disease Mother    Hypertension Mother    Gallbladder disease Mother    Heart disease Father    Pneumonia Sister    Breast cancer Paternal Grandmother    Hypertension Brother    Hyperlipidemia Brother    Hypertension Child    Gallbladder disease Other     Objective: Office vital signs reviewed. There were no vitals taken for this visit.  Physical Examination:  General: Awake, alert, *** nourished, No acute distress HEENT: Normal    Neck: No masses palpated. No lymphadenopathy    Ears: Tympanic membranes intact, normal light reflex, no erythema, no bulging    Eyes: PERRLA, extraocular membranes intact, sclera ***    Nose: nasal turbinates moist, *** nasal discharge    Throat: moist mucus membranes, no erythema, *** tonsillar exudate.  Airway is patent Cardio: regular rate and rhythm, S1S2 heard, no murmurs appreciated Pulm: clear to auscultation bilaterally, no wheezes, rhonchi or rales; normal work of breathing on room air GI: soft, non-tender, non-distended, bowel sounds present x4, no hepatomegaly, no splenomegaly, no masses GU: external vaginal tissue ***, cervix  ***, *** punctate lesions on cervix appreciated, *** discharge from cervical os, *** bleeding, *** cervical motion tenderness, *** abdominal/ adnexal masses Extremities: warm, well perfused, No edema, cyanosis or clubbing; +*** pulses bilaterally MSK: *** gait and *** station Skin: dry; intact; no rashes or lesions Neuro: *** Strength and light touch sensation grossly intact, *** DTRs ***/4  Assessment/ Plan: 83 y.o. female   Primary insomnia  ***   Tomesha Sargent CHRISTELLA Fielding, DO [facility] [phone]

## 2023-10-07 DIAGNOSIS — M4727 Other spondylosis with radiculopathy, lumbosacral region: Secondary | ICD-10-CM | POA: Diagnosis not present

## 2023-10-07 DIAGNOSIS — G894 Chronic pain syndrome: Secondary | ICD-10-CM | POA: Diagnosis not present

## 2023-10-07 DIAGNOSIS — Z981 Arthrodesis status: Secondary | ICD-10-CM | POA: Diagnosis not present

## 2023-10-16 ENCOUNTER — Other Ambulatory Visit: Payer: Self-pay | Admitting: Family Medicine

## 2023-10-16 DIAGNOSIS — R0781 Pleurodynia: Secondary | ICD-10-CM

## 2023-10-16 DIAGNOSIS — T148XXA Other injury of unspecified body region, initial encounter: Secondary | ICD-10-CM

## 2023-10-17 ENCOUNTER — Other Ambulatory Visit: Payer: Self-pay | Admitting: Family Medicine

## 2023-10-17 DIAGNOSIS — F5101 Primary insomnia: Secondary | ICD-10-CM

## 2023-10-17 NOTE — Telephone Encounter (Signed)
 Copied from CRM [id number]. Topic: Clinical - Medication Refill >> Oct 17, 2023  3:12 PM Jasmin G wrote: Medication: Sleeping pills. Pt's daughter could not remember the name. Call her back if needed at [phone].  Has the patient contacted their pharmacy? Yes (Agent: If no, request that the patient contact the pharmacy for the refill. If patient does not wish to contact the pharmacy document the reason why and proceed with request.) (Agent: If yes, when and what did the pharmacy advise?)  This is the patient's preferred pharmacy:  [id number] - [place], [place] - [address] [place] [address] Phone: [phone] Fax: [phone]  Is this the correct pharmacy for this prescription? Yes If no, delete pharmacy and type the correct one.   Has the prescription been filled recently? Yes  Is the patient out of the medication? Yes  Has the patient been seen for an appointment in the last year OR does the patient have an upcoming appointment? Yes  Can we respond through MyChart? No  Agent: Please be advised that Rx refills may take up to 3 business days. We ask that you follow-up with your pharmacy.

## 2023-10-17 NOTE — Telephone Encounter (Signed)
 NA/VM full refill request is for a controlled medication that has to be filled during a visit. At last OV PCP wanted to see pt back in one month also.

## 2023-10-21 ENCOUNTER — Ambulatory Visit: Payer: Self-pay | Admitting: Nurse Practitioner

## 2023-10-21 NOTE — Telephone Encounter (Signed)
 Duplicate

## 2023-10-21 NOTE — Telephone Encounter (Signed)
 Kaitlyn Serrano scheduled for 11/19/2023 for med refill. She says that pt is out and if pt can get samples until apt. Can nurse work pt in sooner? Please call back

## 2023-10-22 ENCOUNTER — Other Ambulatory Visit: Payer: Self-pay | Admitting: Family Medicine

## 2023-10-22 DIAGNOSIS — F411 Generalized anxiety disorder: Secondary | ICD-10-CM

## 2023-10-22 DIAGNOSIS — F331 Major depressive disorder, recurrent, moderate: Secondary | ICD-10-CM

## 2023-11-01 ENCOUNTER — Other Ambulatory Visit: Payer: Self-pay | Admitting: Family Medicine

## 2023-11-19 ENCOUNTER — Ambulatory Visit: Payer: Self-pay | Admitting: Family Medicine

## 2023-11-20 ENCOUNTER — Encounter: Payer: Self-pay | Admitting: Family Medicine

## 2023-11-24 ENCOUNTER — Ambulatory Visit: Admitting: Family Medicine

## 2023-11-25 ENCOUNTER — Encounter: Payer: Self-pay | Admitting: Family Medicine

## 2023-11-25 ENCOUNTER — Ambulatory Visit: Admitting: Family Medicine

## 2023-11-25 VITALS — BP 159/75 | HR 63 | Temp 97.5°F | Ht 62.0 in | Wt 125.2 lb

## 2023-11-25 DIAGNOSIS — F5101 Primary insomnia: Secondary | ICD-10-CM | POA: Diagnosis not present

## 2023-11-25 DIAGNOSIS — I10 Essential (primary) hypertension: Secondary | ICD-10-CM | POA: Diagnosis not present

## 2023-11-25 DIAGNOSIS — F330 Major depressive disorder, recurrent, mild: Secondary | ICD-10-CM

## 2023-11-25 DIAGNOSIS — Z79899 Other long term (current) drug therapy: Secondary | ICD-10-CM | POA: Diagnosis not present

## 2023-11-25 DIAGNOSIS — I2510 Atherosclerotic heart disease of native coronary artery without angina pectoris: Secondary | ICD-10-CM

## 2023-11-25 DIAGNOSIS — F03B11 Unspecified dementia, moderate, with agitation: Secondary | ICD-10-CM

## 2023-11-25 DIAGNOSIS — Z23 Encounter for immunization: Secondary | ICD-10-CM | POA: Diagnosis not present

## 2023-11-25 LAB — LIPID PANEL

## 2023-11-25 MED ORDER — BELSOMRA 5 MG PO TABS: 5.0000 mg | ORAL_TABLET | Freq: Every evening | ORAL | 5 refills | Status: DC | PRN

## 2023-11-25 MED ORDER — BELSOMRA 5 MG PO TABS: 5.0000 mg | ORAL_TABLET | Freq: Every evening | ORAL | 5 refills | Status: AC | PRN

## 2023-11-25 NOTE — Progress Notes (Signed)
 Subjective: CC: Insomnia PCP: [name], DO YEP:Dypmozb T Wedemeyer is a 83 y.o. female presenting to clinic today for:  Insomnia associated with dementia, mood disturbance Patient was treated with ramelteon  previously.  She was switched to Belsomra  a couple of months ago.  Needing refills on this controlled substance.  It seems to be working well for her sleep.  She reports good control of anxiety and depression with BuSpar  and Zoloft .  No drug or alcohol use.  Hypertension associate with hyperlipidemia and CAD Compliant with losartan , Zetia  and metoprolol .  No reports of chest pain, shortness of breath or change in exercise tolerance.  Appetite is fairly good.  She is fasting this morning.   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 family unaware   Dysphagia    Dyspnea    Generalized weakness    GERD (gastroesophageal reflux disease)    GERD (gastroesophageal reflux disease)    High cholesterol    Hoarseness    Hx of cardiovascular stress test    negative 2012 and 2016   Hypertension    Insomnia    Intervertebral disc disorder with myelopathy, lumbar region    Lumbago    Lumbar spine pain    Non-ST elevation (NSTEMI) myocardial infarction (HCC) 04/03/2023   PONV (postoperative nausea and vomiting)    confusion after anesthesia   Renal insufficiency    Ringing in ears    resolved   Spondylosis with myelopathy, lumbar region    legs, back   Syncope and collapse  04/09/2023    Current Outpatient Medications:    aspirin  EC 81 MG tablet, Take 81 mg by mouth daily. Swallow whole., Disp: , Rfl:    busPIRone  (BUSPAR ) 10 MG tablet, Take 1 tablet (10 mg total) by mouth 3 (three) times daily., Disp: 270 tablet, Rfl: 3   donepezil  (ARICEPT ) 10 MG tablet, Take one tablet by mouth daily at 9pm bedtime for Dementia, Disp: 90 tablet, Rfl: 1   ezetimibe  (ZETIA ) 10 MG tablet, TAKE ONE TABLET BY MOUTH DAILY AT 9AM. NEEDS APPOINTMENT BEFORE FURTHER REFILLS, 2ND ATTEMPT, Disp: 90 tablet, Rfl: 3   HYDROcodone -acetaminophen  (NORCO) 10-325 MG tablet, Take 1 tablet by mouth every 4 (four) hours as needed for severe pain (pain score 7-10)., Disp: , Rfl:    lidocaine  (LIDODERM ) 5 %, 1 PATCH ONTO THE SKIN DAILY AS NEEDED FOR PAIN. REMOVE & DISCARD PATCH WITHIN 12 HOURS OR AS DIRECTED BY MDA, Disp: 30 patch, Rfl: PRN   losartan  (COZAAR ) 100 MG tablet, Take 1 tablet (100 mg total) by mouth daily., Disp: 90 tablet, Rfl: 3   metoprolol  succinate (TOPROL  XL) 25 MG 24 hr tablet, Take 1 tablet (25 mg total) by mouth daily., Disp: 90 tablet, Rfl: 3   pantoprazole  (PROTONIX ) 40 MG tablet, Take 1 tablet (40 mg total) by mouth 2 (two) times daily., Disp: 180 tablet, Rfl: 3   polyethylene glycol (MIRALAX  / GLYCOLAX ) 17  g packet, Take 17 g by mouth daily as needed for moderate constipation., Disp: 30 each, Rfl: PRN   sertraline  (ZOLOFT ) 100 MG tablet, Take 1 tablet (100 mg total) by mouth daily. **dose change, Disp: 90 tablet, Rfl: 3   Suvorexant  (BELSOMRA ) 5 MG TABS, Take 1 tablet (5 mg total) by mouth at bedtime as needed (sleep to replace Ramelteon )., Disp: 30 tablet, Rfl: 5 Social History   Socioeconomic History   Marital status: Divorced    Spouse name: Not on file   Number of children: 2   Years of education: Not on file   Highest education level: Not on file  Occupational History   Not on file  Tobacco Use   Smoking status: Former    Current packs/day: 0.00    Average  packs/day: 4.0 packs/day for 3.7 years (14.8 ttl pk-yrs)    Types: Cigarettes    Start date: 06/10/1960    Quit date: 02/20/1964    Years since quitting: 59.8   Smokeless tobacco: Never  Vaping Use   Vaping status: Never Used  Substance and Sexual Activity   Alcohol use: No    Alcohol/week: 0.0 standard drinks of alcohol   Drug use: No   Sexual activity: Not Currently    Birth control/protection: None  Other Topics Concern   Not on file  Social History Narrative   ** Merged History Encounter **       ** Merged History Encounter **  Ms Merle lives alone. She is separated/divorced from her second husband and widowed by her first husband. She has twin daughters, Zebedee and Reena. Zebedee has been a cause of stress for her and she believes was preven   ting her from getting medical care that she needed. She is no longer talking to [place]. She states that Reena is involved in her care and that she trusts her to have her best interest in mind.      Social Drivers of Health   Financial Resource Strain: Medium Risk (08/06/2023)   Received from [facility]   Overall Financial Resource Strain (CARDIA)    Difficulty of Paying Living Expenses: Somewhat hard  Food Insecurity: Food Insecurity Present (08/06/2023)   Received from [facility]   Hunger Vital Sign    Within the past 12 months, you worried that your food would run out before you got the money to buy more.: Sometimes true    Within the past 12 months, the food you bought just didn't last and you didn't have money to get more.: Sometimes true  Transportation Needs: No Transportation Needs (08/06/2023)   Received from [facility] - Transportation    Lack of Transportation (Medical): No    Lack of Transportation (Non-Medical): No  Physical Activity: Insufficiently Active (07/11/2022)   Exercise Vital Sign    Days of Exercise per Week: 3 days    Minutes of Exercise per Session: 30 min  Stress: No Stress Concern  Present (07/11/2022)   [name] - Occupational Stress Questionnaire    Feeling of Stress : Not at all  Social Connections: Patient Declined (04/10/2023)   Social Connection and Isolation Panel    Frequency of Communication with Friends and Family: Patient declined    Frequency of Social Gatherings with Friends and Family: Patient declined    Attends Religious Services: Patient declined    Active Member of Clubs or Organizations: Patient declined    Attends Banker Meetings: Patient declined  Marital Status: Patient declined  Recent Concern: Social Connections - Socially Isolated (04/03/2023)   Social Connection and Isolation Panel    Frequency of Communication with Friends and Family: More than three times a week    Frequency of Social Gatherings with Friends and Family: More than three times a week    Attends Religious Services: Never    Database administrator or Organizations: No    Attends Banker Meetings: Never    Marital Status: Widowed  Intimate Partner Violence: Not At Risk (04/09/2023)   Humiliation, Afraid, Rape, and Kick questionnaire    Fear of Current or Ex-Partner: No    Emotionally Abused: No    Physically Abused: No    Sexually Abused: No   Family History  Problem Relation Age of Onset   Diabetes Mother    Heart disease Mother    Hypertension Mother    Gallbladder disease Mother    Heart disease Father    Pneumonia Sister    Breast cancer Paternal Grandmother    Hypertension Brother    Hyperlipidemia Brother    Hypertension Child    Gallbladder disease Other     Objective: Office vital signs reviewed. BP (!) 159/75   Pulse 63   Temp (!) 97.5 F (36.4 C)   Ht 5' 2 (1.575 m)   Wt 125 lb 4 oz (56.8 kg)   SpO2 97%   BMI 22.91 kg/m   Physical Examination:  General: Awake, alert, nontoxic elderly female, No acute distress HEENT: Sclera white.  Moist mucous membranes Cardio: regular rate and  rhythm, S1S2 heard, no murmurs appreciated Pulm: clear to auscultation bilaterally, no wheezes, rhonchi or rales; normal work of breathing on room air GI: Bowel sounds present.  Flat, soft and nontender Psych: Mood stable, speech normal.  Pleasant and interactive     09/16/2023   11:32 AM 07/30/2023   10:12 AM 07/14/2023    2:55 PM  Depression screen PHQ 2/9  Decreased Interest 0 0 2  Down, Depressed, Hopeless 1 1 3   PHQ - 2 Score 1 1 5   Altered sleeping 1 1 3   Tired, decreased energy 1 1 1   Change in appetite 0 1 1  Feeling bad or failure about yourself  0 0 1  Trouble concentrating 0 0 3  Moving slowly or fidgety/restless 0 0 0  Suicidal thoughts 0 0 0  PHQ-9 Score 3 4 14   Difficult doing work/chores  Not difficult at all Extremely dIfficult      09/16/2023   11:32 AM 07/30/2023   10:11 AM 07/14/2023    2:56 PM 05/08/2023    9:47 AM  GAD 7 : Generalized Anxiety Score  Nervous, Anxious, on Edge 1 1 3 3   Control/stop worrying 1 1 3 2   Worry too much - different things 1 1 3 2   Trouble relaxing 0 0 3 2  Restless 0 0 3 2  Easily annoyed or irritable 0 0 3 2  Afraid - awful might happen 0 0 3 2  Total GAD 7 Score 3 3 21 15   Anxiety Difficulty Somewhat difficult Not difficult at all Extremely difficult Somewhat difficult    Assessment/ Plan: 83 y.o. female   Primary insomnia - Plan: ToxASSURE Select 13 (MW), Urine, CMP14+EGFR, Suvorexant  (BELSOMRA ) 5 MG TABS, DISCONTINUED: Suvorexant  (BELSOMRA ) 5 MG TABS  Controlled substance agreement signed - Plan: ToxASSURE Select 13 (MW), Urine, CMP14+EGFR  Essential hypertension - Plan: CMP14+EGFR  Mild CAD - Plan: CMP14+EGFR, Lipid  Panel  Moderate dementia with agitation, unspecified dementia type (HCC) - Plan: CMP14+EGFR, CBC with Differential  Mild episode of recurrent major depressive disorder - Plan: CMP14+EGFR   UDS and CSA were updated as per office policy and the national narcotic database was reviewed with no red flags.   Belsomra  renewed.  Use as directed.  Blood pressure is NOT controlled upon recheck.  She will need 2-week blood pressure check up with nurse and if persistently elevated, will need to consider additional medications as she is on max dose of losartan  and her pulse is at 63 so I am not sure higher doses of metoprolol  would be tolerated  Fasting lipid collected.  Dementia is stable.  Depression and anxiety are stable.  No refills were needed at this time on Zoloft  or BuSpar   Flu shot administered  Norene CHRISTELLA Fielding, DO [facility] [phone]

## 2023-11-26 ENCOUNTER — Ambulatory Visit: Payer: Self-pay | Admitting: Family Medicine

## 2023-11-26 LAB — CBC WITH DIFFERENTIAL/PLATELET
Basophils Absolute: 0.1 x10E3/uL (ref 0.0–0.2)
Basos: 1 %
EOS (ABSOLUTE): 0.5 x10E3/uL — ABNORMAL HIGH (ref 0.0–0.4)
Eos: 6 %
Hematocrit: 40.2 % (ref 34.0–46.6)
Hemoglobin: 12.6 g/dL (ref 11.1–15.9)
Immature Grans (Abs): 0 x10E3/uL (ref 0.0–0.1)
Immature Granulocytes: 0 %
Lymphocytes Absolute: 1 x10E3/uL (ref 0.7–3.1)
Lymphs: 13 %
MCH: 29.3 pg (ref 26.6–33.0)
MCHC: 31.3 g/dL — ABNORMAL LOW (ref 31.5–35.7)
MCV: 94 fL (ref 79–97)
Monocytes Absolute: 0.9 x10E3/uL (ref 0.1–0.9)
Monocytes: 11 %
Neutrophils Absolute: 5.2 x10E3/uL (ref 1.4–7.0)
Neutrophils: 69 %
Platelets: 271 x10E3/uL (ref 150–450)
RBC: 4.3 x10E6/uL (ref 3.77–5.28)
RDW: 12.8 % (ref 11.7–15.4)
WBC: 7.6 x10E3/uL (ref 3.4–10.8)

## 2023-11-26 LAB — LIPID PANEL
Cholesterol, Total: 196 mg/dL (ref 100–199)
HDL: 72 mg/dL (ref >39)
LDL CALC COMMENT:: 2.7 ratio (ref 0.0–4.4)
LDL Chol Calc (NIH): 108 mg/dL — AB (ref 0–99)
Triglycerides: 89 mg/dL (ref 0–149)
VLDL Cholesterol Cal: 16 mg/dL (ref 5–40)

## 2023-11-26 LAB — CMP14+EGFR
ALT: 13 IU/L (ref 0–32)
AST: 29 IU/L (ref 0–40)
Albumin: 4.2 g/dL (ref 3.7–4.7)
Alkaline Phosphatase: 139 IU/L — ABNORMAL HIGH (ref 48–129)
BUN/Creatinine Ratio: 22 (ref 12–28)
BUN: 17 mg/dL (ref 8–27)
Bilirubin Total: 0.7 mg/dL (ref 0.0–1.2)
CO2: 23 mmol/L (ref 20–29)
Calcium: 9.5 mg/dL (ref 8.7–10.3)
Chloride: 101 mmol/L (ref 96–106)
Creatinine, Ser: 0.79 mg/dL (ref 0.57–1.00)
Globulin, Total: 2.7 g/dL (ref 1.5–4.5)
Glucose: 88 mg/dL (ref 70–99)
Potassium: 4.3 mmol/L (ref 3.5–5.2)
Sodium: 139 mmol/L (ref 134–144)
Total Protein: 6.9 g/dL (ref 6.0–8.5)
eGFR: 74 mL/min/1.73 (ref >59)

## 2023-11-27 ENCOUNTER — Ambulatory Visit

## 2023-11-27 VITALS — BP 159/75 | HR 63 | Ht 62.0 in | Wt 125.0 lb

## 2023-11-27 DIAGNOSIS — Z0000 Encounter for general adult medical examination without abnormal findings: Secondary | ICD-10-CM | POA: Diagnosis not present

## 2023-11-27 NOTE — Progress Notes (Deleted)
 Subjective:   Kaitlyn Serrano is a 83 y.o. who presents for a Medicare Wellness preventive visit.  As a reminder, Annual Wellness Visits don't include a physical exam, and some assessments may be limited, especially if this visit is performed virtually. We may recommend an in-person follow-up visit with your provider if needed.  Visit Complete: Virtual I connected with  Kaitlyn Serrano on 11/28/23 by a audio enabled telemedicine application and verified that I am speaking with the correct person using two identifiers.  Patient Location: Home  Provider Location: Home Office  I discussed the limitations of evaluation and management by telemedicine. The patient expressed understanding and agreed to proceed.  Vital Signs: Because this visit was a virtual/telehealth visit, some criteria may be missing or patient reported. Any vitals not documented were not able to be obtained and vitals that have been documented are patient reported.  VideoDeclined- This patient declined Librarian, academic. Therefore the visit was completed with audio only.  Persons Participating in Visit: Patient assisted by with daughter/[name].  AWV Questionnaire: No: Patient Medicare AWV questionnaire was not completed prior to this visit.  Cardiac Risk Factors include: advanced age (>29men, >57 women);hypertension;smoking/ tobacco exposure     Objective:    Today's Vitals   11/27/23 0927 11/27/23 0929  BP: (!) 159/75   Pulse: 63   Weight: 125 lb (56.7 kg)   Height: 5' 2 (1.575 m)   PainSc:  8    Body mass index is 22.86 kg/m.     11/27/2023    9:40 AM 09/20/2023    7:52 PM 07/18/2023    5:33 PM 04/09/2023   10:22 PM 04/09/2023    1:07 PM 04/02/2023    7:36 AM 07/11/2022    2:26 PM  Advanced Directives  Does Patient Have a Medical Advance Directive? Yes No No Yes No No Yes  Type of Estate agent of [place];Living will   Healthcare Power of [employer] of [place];Living will  Does patient want to make changes to medical advance directive?    No - Patient declined   No - Patient declined  Copy of Healthcare Power of Attorney in Chart? Yes - validated most recent copy scanned in chart (See row information)   No - copy requested   Yes - validated most recent copy scanned in chart (See row information)  Would patient like information on creating a medical advance directive?  No - Patient declined  No - Patient declined  No - Patient declined     Current Medications (verified) Outpatient Encounter Medications as of 11/27/2023  Medication Sig   aspirin  EC 81 MG tablet Take 81 mg by mouth daily. Swallow whole.   busPIRone  (BUSPAR ) 10 MG tablet Take 1 tablet (10 mg total) by mouth 3 (three) times daily.   donepezil  (ARICEPT ) 10 MG tablet Take one tablet by mouth daily at 9pm bedtime for Dementia   ezetimibe  (ZETIA ) 10 MG tablet TAKE ONE TABLET BY MOUTH DAILY AT 9AM. NEEDS APPOINTMENT BEFORE FURTHER REFILLS, 2ND ATTEMPT   HYDROcodone -acetaminophen  (NORCO) 10-325 MG tablet Take 1 tablet by mouth every 4 (four) hours as needed for severe pain (pain score 7-10).   lidocaine  (LIDODERM ) 5 % 1 PATCH ONTO THE SKIN DAILY AS NEEDED FOR PAIN. REMOVE & DISCARD PATCH WITHIN 12 HOURS OR AS DIRECTED BY MDA   losartan  (COZAAR ) 100 MG tablet Take 1 tablet (100 mg total) by mouth daily.   metoprolol  succinate (  TOPROL  XL) 25 MG 24 hr tablet Take 1 tablet (25 mg total) by mouth daily.   pantoprazole  (PROTONIX ) 40 MG tablet Take 1 tablet (40 mg total) by mouth 2 (two) times daily.   polyethylene glycol (MIRALAX  / GLYCOLAX ) 17 g packet Take 17 g by mouth daily as needed for moderate constipation.   sertraline  (ZOLOFT ) 100 MG tablet Take 1 tablet (100 mg total) by mouth daily. **dose change   Suvorexant  (BELSOMRA ) 5 MG TABS Take 1 tablet (5 mg total) by mouth at bedtime as needed (sleep to replace Ramelteon ).   No facility-administered encounter  medications on file as of 11/27/2023.    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 family unaware   Dysphagia    Dyspnea    Generalized weakness    GERD (gastroesophageal reflux disease)    GERD (gastroesophageal reflux disease)    High cholesterol    Hoarseness    Hx of cardiovascular stress test    negative 2012 and 2016   Hypertension    Insomnia    Intervertebral disc disorder with myelopathy, lumbar region    Lumbago    Lumbar spine pain    Non-ST elevation (NSTEMI) myocardial infarction (HCC) 04/03/2023   PONV (postoperative nausea and vomiting)    confusion after anesthesia   Renal insufficiency    Ringing in ears    resolved   Spondylosis with myelopathy, lumbar region    legs, back   Syncope and collapse 04/09/2023   Past Surgical History:  Procedure Laterality Date   ABDOMINAL HYSTERECTOMY     BACK SURGERY     BONE MARROW ASPIRATION     CERVICAL SPINE SURGERY     EYE SURGERY Bilateral    Lasic   FIXATION KYPHOPLASTY     LAMINOTOMY     LEFT HEART CATH AND CORONARY ANGIOGRAPHY N/A 04/03/2023   Procedure: LEFT HEART CATH AND CORONARY ANGIOGRAPHY;  Surgeon: Mady Bruckner, MD;  Location: [facility];  Service: Cardiovascular;  Laterality: N/A;   SPINAL FUSION     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   Procedure: TOTAL HIP ARTHROPLASTY ANTERIOR APPROACH;  Surgeon: Ernie Cough, MD;  Location: [facility];  Service: Orthopedics;  Laterality: Right;  70 mins   TOTAL HIP ARTHROPLASTY Left 05/14/2022   Procedure: TOTAL HIP ARTHROPLASTY ANTERIOR APPROACH;  Surgeon: Ernie Cough, MD;  Location: [facility];  Service: Orthopedics;   Laterality: Left;   Family History  Problem Relation Age of Onset   Diabetes Mother    Heart disease Mother    Hypertension Mother    Gallbladder disease Mother    Heart disease Father    Pneumonia Sister    Breast cancer Paternal Grandmother    Hypertension Brother    Hyperlipidemia Brother    Hypertension Child    Gallbladder disease Other    Social History   Socioeconomic History   Marital status: Divorced    Spouse name: Not on file   Number of children: 2   Years of education: Not on file   Highest education level: Not on file  Occupational History   Not on file  Tobacco Use  Smoking status: Former    Current packs/day: 0.00    Average packs/day: 4.0 packs/day for 3.7 years (14.8 ttl pk-yrs)    Types: Cigarettes    Start date: 06/10/1960    Quit date: 02/20/1964    Years since quitting: 59.8   Smokeless tobacco: Never  Vaping Use   Vaping status: Never Used  Substance and Sexual Activity   Alcohol use: No    Alcohol/week: 0.0 standard drinks of alcohol   Drug use: No   Sexual activity: Not Currently    Birth control/protection: None  Other Topics Concern   Not on file  Social History Narrative   ** Merged History Encounter **       ** Merged History Encounter **  Ms Merle lives alone. She is separated/divorced from her second husband and widowed by her first husband. She has twin daughters, Zebedee and Reena. Zebedee has been a cause of stress for her and she believes was preven   ting her from getting medical care that she needed. She is no longer talking to [place]. She states that Reena is involved in her care and that she trusts her to have her best interest in mind.      Social Drivers of Corporate investment banker Strain: Low Risk  (11/27/2023)   Overall Financial Resource Strain (CARDIA)    Difficulty of Paying Living Expenses: Not hard at all  Food Insecurity: Food Insecurity Present (11/27/2023)   Hunger Vital Sign    Worried About Running  Out of Food in the Last Year: Sometimes true    Ran Out of Food in the Last Year: Sometimes true  Transportation Needs: No Transportation Needs (11/27/2023)   PRAPARE - Administrator, Civil Service (Medical): No    Lack of Transportation (Non-Medical): No  Physical Activity: Insufficiently Active (11/27/2023)   Exercise Vital Sign    Days of Exercise per Week: 3 days    Minutes of Exercise per Session: 30 min  Stress: No Stress Concern Present (11/27/2023)   [name] - Occupational Stress Questionnaire    Feeling of Stress: Not at all  Social Connections: Socially Isolated (11/27/2023)   Social Connection and Isolation Panel    Frequency of Communication with Friends and Family: More than three times a week    Frequency of Social Gatherings with Friends and Family: More than three times a week    Attends Religious Services: Patient declined    Database administrator or Organizations: No    Attends Banker Meetings: Never    Marital Status: Widowed    Tobacco Counseling Counseling given: Yes    Clinical Intake:  Pre-visit preparation completed: Yes  Pain : 0-10 (L-side back pain) Pain Score: 8  Pain Type: Chronic pain Pain Location: Back Pain Orientation: Left Pain Descriptors / Indicators: Constant, Aching Pain Onset: Other (comment) Pain Frequency: Constant Pain Relieving Factors: pain med  Pain Relieving Factors: pain med  BMI - recorded: 22.86 Nutritional Status: BMI of 19-24  Normal Nutritional Risks: None Diabetes: No  Lab Results  Component Value Date   HGBA1C 5.4 05/08/2022   HGBA1C 5.3 08/05/2018   HGBA1C 5.6 02/12/2017     How often do you need to have someone help you when you read instructions, pamphlets, or other written materials from your doctor or pharmacy?: 3 - Sometimes (pt's daughter, angie)  Interpreter Needed?: No  Information entered by :: alia t/cma   Activities of Daily  Living      11/27/2023    9:36 AM 04/09/2023   10:24 PM  In your present state of health, do you have any difficulty performing the following activities:  Hearing? 0   Vision? 0   Difficulty concentrating or making decisions? 0   Walking or climbing stairs? 0   Dressing or bathing? 1   Comment pt's daughter, mercy   Doing errands, shopping? 1 1  Comment pt's daughter, Advertising account executive and eating ? N   Using the Toilet? N   In the past six months, have you accidently leaked urine? N   Do you have problems with loss of bowel control? N   Managing your Medications? Y   Managing your Finances? Y   Comment pt's daughter, Geophysicist/field seismologist or managing your Housekeeping? Y   Comment pt's daughter, angie     Patient Care Team: [name], DO as PCP - General (Family Medicine) Alvan Dorn FALCON, MD as PCP - Cardiology (Cardiology) Karenann Barnacle, PA-C as Physician Assistant (Orthopedic Surgery) Ernie Cough, MD as Consulting Physician (Orthopedic Surgery) Roddie Bring, DPM as Consulting Physician (Podiatry)  I have updated your Care Teams any recent Medical Services you may have received from other providers in the past year.     Assessment:   This is a routine wellness examination for [place].  Hearing/Vision screen Hearing Screening - Comments:: Pt denies hearing/pt needs ears cleaned  Vision Screening - Comments:: Pt wear glasses   Goals Addressed   None    Depression Screen     11/27/2023    9:41 AM 11/25/2023   11:07 AM 09/16/2023   11:32 AM 07/30/2023   10:12 AM 07/14/2023    2:55 PM 07/03/2023    8:41 AM 05/26/2023    9:59 AM  PHQ 2/9 Scores  PHQ - 2 Score 0 2 1 1 5  1   PHQ- 9 Score 0 4 3 4 14     Exception Documentation      Medical reason     Fall Risk     11/25/2023   11:07 AM 09/16/2023   11:27 AM 07/30/2023   10:14 AM 07/14/2023    2:55 PM 07/03/2023    8:41 AM  Fall Risk   Falls in the past year? 1 1 1 1 1   Number falls in past yr: 0 1 1 1 1   Injury  with Fall? 0 1 1 1 1   Risk for fall due to :  Impaired balance/gait;Medication side effect;History of fall(s) History of fall(s);Impaired mobility;Medication side effect History of fall(s) History of fall(s)  Follow up Falls evaluation completed Falls evaluation completed Falls evaluation completed Education provided Falls evaluation completed    MEDICARE RISK AT HOME:  Medicare Risk at Home Any stairs in or around the home?: Yes If so, are there any without handrails?: Yes Home free of loose throw rugs in walkways, pet beds, electrical cords, etc?: Yes Adequate lighting in your home to reduce risk of falls?: Yes Life alert?: No Use of a cane, walker or w/c?: Yes Grab bars in the bathroom?: No Shower chair or bench in shower?: No Elevated toilet seat or a handicapped toilet?: No  TIMED UP AND GO:  Was the test performed?  No  Cognitive Function: Per pt's daughter, mercy stated that pt will not be able to do test.     07/30/2023   10:24 AM 08/09/2021    3:20 PM 12/17/2019    3:32 PM  08/20/2019    8:42 AM  MMSE - Mini Mental State Exam  Orientation to time 2 3 5 3   Orientation to Place 2 0 5 5  Registration 3 3 3  0  Attention/ Calculation 0 5 2 0  Recall 2 0 2 2  Language- name 2 objects 2 2 2 2   Language- repeat 1 1 1  0  Language- follow 3 step command 3 3 3 3   Language- read & follow direction 1 1 1 1   Write a sentence 0 1 1 0  Copy design 1 0 1 0  Total score 17 19 26 16         07/11/2022    2:28 PM 07/11/2022    2:27 PM 03/28/2021   12:10 PM 03/28/2021   11:53 AM  6CIT Screen  What Year? 0 points 0 points 0 points 0 points  What month? 0 points 0 points 0 points 0 points  What time? 3 points 0 points 0 points 0 points  Count back from 20 2 points 0 points 0 points 0 points  Months in reverse 0 points 0 points 4 points 4 points  Repeat phrase 4 points 0 points 2 points 2 points  Total Score 9 points 0 points 6 points 6 points    Immunizations Immunization History   Administered Date(s) Administered   Fluad Quad(high Dose 65+) 12/05/2020, 11/19/2021   Fluzone Influenza virus vaccine,trivalent (IIV3), split virus 11/17/2014   INFLUENZA, HIGH DOSE SEASONAL PF 02/26/2012, 11/25/2013, 11/26/2014, 11/26/2015, 11/25/2016, 11/25/2017, 11/25/2023   Influenza Split 11/09/2012   Influenza,inj,Quad PF,6+ Mos 11/14/2015   Influenza-Unspecified 11/10/2013, 11/17/2014, 11/29/2016, 11/13/2017, 11/30/2018, 11/18/2020   Moderna Sars-Covid-2 Vaccination 04/27/2019, 05/25/2019, 01/25/2020   Novel Infuenza-h1n1-09 02/04/2008   Pfizer(Comirnaty)Fall Seasonal Vaccine 12 years and older 12/04/2021   Pneumococcal Conjugate-13 11/10/2013, 11/13/2013   Pneumococcal Polysaccharide-23 02/25/2006, 08/28/2006, 10/26/2013, 12/02/2016   Td (Adult),5 Lf Tetanus Toxid, Preservative Free 11/04/2006   Td,absorbed, Preservative Free, Adult Use, Lf Unspecified 11/04/2006   Tdap 11/04/2006, 10/12/2010, 11/13/2017   Zoster Recombinant(Shingrix) 03/09/2021   Zoster, Live 10/27/2010    Screening Tests Health Maintenance  Topic Date Due   COVID-19 Vaccine (5 - 2025-26 season) 12/11/2023 (Originally 10/27/2023)   Zoster Vaccines- Shingrix (2 of 2) 12/17/2023 (Originally 05/04/2021)   Medicare Annual Wellness (AWV)  11/26/2024   DTaP/Tdap/Td (5 - Td or Tdap) 11/14/2027   Pneumococcal Vaccine: 50+ Years  Completed   Influenza Vaccine  Completed   HPV VACCINES  Aged Out   Meningococcal B Vaccine  Aged Out    Health Maintenance Items Addressed: See Nurse Notes at the end of this note  Additional Screening:  Vision Screening: Recommended annual ophthalmology exams for early detection of glaucoma and other disorders of the eye. Is the patient up to date with their annual eye exam?  Yes  Who is the provider or what is the name of the office in which the patient attends annual eye exams? N/a  Dental Screening: Recommended annual dental exams for proper oral hygiene  Community Resource  Referral / Chronic Care Management: CRR required this visit?  No   CCM required this visit?  No   Plan:    I have personally reviewed and noted the following in the patient's chart:   Medical and social history Use of alcohol, tobacco or illicit drugs  Current medications and supplements including opioid prescriptions. Patient is not currently taking opioid prescriptions. Functional ability and status Nutritional status Physical activity Advanced directives List of other physicians Hospitalizations,  surgeries, and ER visits in previous 12 months Vitals Screenings to include cognitive, depression, and falls Referrals and appointments  In addition, I have reviewed and discussed with patient certain preventive protocols, quality metrics, and best practice recommendations. A written personalized care plan for preventive services as well as general preventive health recommendations were provided to patient.   Ozie Ned, CMA   11/28/2023   After Visit Summary: (MyChart) Due to this being a telephonic visit, the after visit summary with patients personalized plan was offered to patient via MyChart   Notes: Nothing significant to report at this time.

## 2023-11-27 NOTE — Patient Instructions (Signed)
 [name],  Thank you for taking the time for your Medicare Wellness Visit. I appreciate your continued commitment to your health goals. Please review the care plan we discussed, and feel free to reach out if I can assist you further.  Medicare recommends these wellness visits once per year to help you and your care team stay ahead of potential health issues. These visits are designed to focus on prevention, allowing your provider to concentrate on managing your acute and chronic conditions during your regular appointments.  Please note that Annual Wellness Visits do not include a physical exam. Some assessments may be limited, especially if the visit was conducted virtually. If needed, we may recommend a separate in-person follow-up with your provider.  Ongoing Care Seeing your primary care provider every 3 to 6 months helps us  monitor your health and provide consistent, personalized care.   Referrals If a referral was made during today's visit and you haven't received any updates within two weeks, please contact the referred provider directly to check on the status.  Recommended Screenings:  Health Maintenance  Topic Date Due   Medicare Annual Wellness Visit  07/11/2023   COVID-19 Vaccine (5 - 2025-26 season) 12/11/2023*   Zoster (Shingles) Vaccine (2 of 2) 12/17/2023*   DTaP/Tdap/Td vaccine (5 - Td or Tdap) 11/14/2027   Pneumococcal Vaccine for age over 41  Completed   Flu Shot  Completed   HPV Vaccine  Aged Out   Meningitis B Vaccine  Aged Out  *Topic was postponed. The date shown is not the original due date.       11/27/2023    9:40 AM  Advanced Directives  Does Patient Have a Medical Advance Directive? Yes  Type of Estate agent of [place];Living will  Copy of Healthcare Power of Attorney in Chart? Yes - validated most recent copy scanned in chart (See row information)   Advance Care Planning is important because it: Ensures you receive medical care  that aligns with your values, goals, and preferences. Provides guidance to your family and loved ones, reducing the emotional burden of decision-making during critical moments.  Vision: Annual vision screenings are recommended for early detection of glaucoma, cataracts, and diabetic retinopathy. These exams can also reveal signs of chronic conditions such as diabetes and high blood pressure.  Dental: Annual dental screenings help detect early signs of oral cancer, gum disease, and other conditions linked to overall health, including heart disease and diabetes.  Please see the attached documents for additional preventive care recommendations.

## 2023-11-29 LAB — TOXASSURE SELECT 13 (MW), URINE

## 2023-12-08 NOTE — Progress Notes (Signed)
 Subjective:    Subjective Kaitlyn Serrano is a 83 y.o. who presents for a Medicare Wellness preventive visit.   As a reminder, Annual Wellness Visits don't include a physical exam, and some assessments may be limited, especially if this visit is performed virtually. We may recommend an in-person follow-up visit with your provider if needed.   Visit Complete: Virtual I connected with  Kaitlyn Serrano on 11/27/23 by a audio enabled telemedicine application and verified that I am speaking with the correct person using two identifiers.   Patient Location: Home   Provider Location: Home Office   I discussed the limitations of evaluation and management by telemedicine. The patient expressed understanding and agreed to proceed.   Vital Signs: Because this visit was a virtual/telehealth visit, some criteria may be missing or patient reported. Any vitals not documented were not able to be obtained and vitals that have been documented are patient reported.   VideoDeclined- This patient declined Librarian, academic. Therefore the visit was completed with audio only.   Persons Participating in Visit: Patient assisted by with daughter/[name].   AWV Questionnaire: No: Patient Medicare AWV questionnaire was not completed prior to this visit.   Cardiac Risk Factors include: advanced age (>93men, >25 women);hypertension;smoking/ tobacco exposure       Objective:    Objective     Today's Vitals    11/27/23 0927 11/27/23 0929  BP: (!) 159/75    Pulse: 63    Weight: 125 lb (56.7 kg)    Height: 5' 2 (1.575 m)    PainSc:   8     Body mass index is 22.86 kg/m.       11/27/2023    9:40 AM 09/20/2023    7:52 PM 07/18/2023    5:33 PM 04/09/2023   10:22 PM 04/09/2023    1:07 PM 04/02/2023    7:36 AM 07/11/2022    2:26 PM  Advanced Directives  Does Patient Have a Medical Advance Directive? Yes No No Yes No No Yes  Type of Estate agent of  [place];Living will     Healthcare Power of [employer] of [place];Living will  Does patient want to make changes to medical advance directive?       No - Patient declined     No - Patient declined  Copy of Healthcare Power of Attorney in Chart? Yes - validated most recent copy scanned in chart (See row information)     No - copy requested     Yes - validated most recent copy scanned in chart (See row information)  Would patient like information on creating a medical advance directive?   No - Patient declined   No - Patient declined   No - Patient declined        Current Medications (verified)     Outpatient Encounter Medications as of 11/27/2023  Medication Sig   aspirin  EC 81 MG tablet Take 81 mg by mouth daily. Swallow whole.   busPIRone  (BUSPAR ) 10 MG tablet Take 1 tablet (10 mg total) by mouth 3 (three) times daily.   donepezil  (ARICEPT ) 10 MG tablet Take one tablet by mouth daily at 9pm bedtime for Dementia   ezetimibe  (ZETIA ) 10 MG tablet TAKE ONE TABLET BY MOUTH DAILY AT 9AM. NEEDS APPOINTMENT BEFORE FURTHER REFILLS, 2ND ATTEMPT   HYDROcodone -acetaminophen  (NORCO) 10-325 MG tablet Take 1 tablet by mouth every 4 (four) hours as  needed for severe pain (pain score 7-10).   lidocaine  (LIDODERM ) 5 % 1 PATCH ONTO THE SKIN DAILY AS NEEDED FOR PAIN. REMOVE & DISCARD PATCH WITHIN 12 HOURS OR AS DIRECTED BY MDA   losartan  (COZAAR ) 100 MG tablet Take 1 tablet (100 mg total) by mouth daily.   metoprolol  succinate (TOPROL  XL) 25 MG 24 hr tablet Take 1 tablet (25 mg total) by mouth daily.   pantoprazole  (PROTONIX ) 40 MG tablet Take 1 tablet (40 mg total) by mouth 2 (two) times daily.   polyethylene glycol (MIRALAX  / GLYCOLAX ) 17 g packet Take 17 g by mouth daily as needed for moderate constipation.   sertraline  (ZOLOFT ) 100 MG tablet Take 1 tablet (100 mg total) by mouth daily. **dose change   Suvorexant  (BELSOMRA ) 5 MG TABS Take 1 tablet (5 mg total) by mouth at bedtime as needed  (sleep to replace Ramelteon ).      No facility-administered encounter medications on file as of 11/27/2023.        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 family unaware   Dysphagia     Dyspnea     Generalized weakness     GERD (gastroesophageal reflux disease)     GERD (gastroesophageal reflux disease)     High cholesterol     Hoarseness     Hx of cardiovascular stress test      negative 2012 and 2016   Hypertension     Insomnia     Intervertebral disc disorder with myelopathy, lumbar region     Lumbago     Lumbar spine pain     Non-ST elevation (NSTEMI) myocardial infarction (HCC) 04/03/2023   PONV (postoperative nausea and vomiting)      confusion after anesthesia   Renal insufficiency     Ringing in ears      resolved   Spondylosis with myelopathy, lumbar region      legs, back   Syncope and collapse 04/09/2023             Past Surgical History:  Procedure Laterality Date   ABDOMINAL HYSTERECTOMY       BACK SURGERY       BONE MARROW ASPIRATION       CERVICAL SPINE SURGERY       EYE SURGERY Bilateral      Lasic   FIXATION KYPHOPLASTY       LAMINOTOMY       LEFT HEART CATH AND CORONARY ANGIOGRAPHY N/A 04/03/2023    Procedure: LEFT HEART CATH AND CORONARY ANGIOGRAPHY;  Surgeon: Mady Bruckner, MD;  Location: [facility];  Service: Cardiovascular;  Laterality: N/A;   SPINAL FUSION       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    Procedure: TOTAL HIP ARTHROPLASTY ANTERIOR APPROACH;  Surgeon: Ernie Cough, MD;  Location: [facility];  Service: Orthopedics;  Laterality: Right;  70 mins   TOTAL HIP ARTHROPLASTY Left  05/14/2022    Procedure: TOTAL HIP ARTHROPLASTY ANTERIOR APPROACH;  Surgeon: Ernie Cough, MD;  Location: [facility];  Service: Orthopedics;  Laterality: Left;  Family History  Problem Relation Age of Onset   Diabetes Mother     Heart disease Mother     Hypertension Mother     Gallbladder disease Mother     Heart disease Father     Pneumonia Sister     Breast cancer Paternal Grandmother     Hypertension Brother     Hyperlipidemia Brother     Hypertension Child     Gallbladder disease Other          Social History         Socioeconomic History   Marital status: Divorced      Spouse name: Not on file   Number of children: 2   Years of education: Not on file   Highest education level: Not on file  Occupational History   Not on file  Tobacco Use   Smoking status: Former      Current packs/day: 0.00      Average packs/day: 4.0 packs/day for 3.7 years (14.8 ttl pk-yrs)      Types: Cigarettes      Start date: 06/10/1960      Quit date: 02/20/1964      Years since quitting: 59.8   Smokeless tobacco: Never  Vaping Use   Vaping status: Never Used  Substance and Sexual Activity   Alcohol use: No      Alcohol/week: 0.0 standard drinks of alcohol   Drug use: No   Sexual activity: Not Currently      Birth control/protection: None  Other Topics Concern   Not on file  Social History Narrative    ** Merged History Encounter **         ** Merged History Encounter **   Ms Merle lives alone. She is separated/divorced from her second husband and widowed by her first husband. She has twin daughters, Zebedee and Reena. Zebedee has been a cause of stress for her and she believes was preven    ting her from getting medical care that she needed. She is no longer talking to [place]. She states that Reena is involved in her care and that she trusts her to have her best interest in mind.       Social Drivers of Acupuncturist Strain: Low Risk  (11/27/2023)     Overall Financial Resource Strain (CARDIA)     Difficulty of Paying Living Expenses: Not hard at all  Food Insecurity: Food Insecurity Present (11/27/2023)    Hunger Vital Sign     Worried About Running Out of Food in the Last Year: Sometimes true     Ran Out of Food in the Last Year: Sometimes true  Transportation Needs: No Transportation Needs (11/27/2023)    PRAPARE - Therapist, art (Medical): No     Lack of Transportation (Non-Medical): No  Physical Activity: Insufficiently Active (11/27/2023)    Exercise Vital Sign     Days of Exercise per Week: 3 days     Minutes of Exercise per Session: 30 min  Stress: No Stress Concern Present (11/27/2023)    [name] - Occupational Stress Questionnaire     Feeling of Stress: Not at all  Social Connections: Socially Isolated (11/27/2023)    Social Connection and Isolation Panel     Frequency of Communication with Friends and Family: More than three times a week     Frequency of Social Gatherings with Friends and  Family: More than three times a week     Attends Religious Services: Patient declined     Active Member of Clubs or Organizations: No     Attends Banker Meetings: Never     Marital Status: Widowed      Tobacco Counseling Counseling given: Yes       Clinical Intake:   Pre-visit preparation completed: Yes   Pain : 0-10 (L-side back pain) Pain Score: 8  Pain Type: Chronic pain Pain Location: Back Pain Orientation: Left Pain Descriptors / Indicators: Constant, Aching Pain Onset: Other (comment) Pain Frequency: Constant Pain Relieving Factors: pain med   Pain Relieving Factors: pain med   BMI - recorded: 22.86 Nutritional Status: BMI of 19-24  Normal Nutritional Risks: None Diabetes: No   Recent Labs       Lab Results  Component Value Date    HGBA1C 5.4 05/08/2022    HGBA1C 5.3 08/05/2018    HGBA1C 5.6 02/12/2017        How often do you  need to have someone help you when you read instructions, pamphlets, or other written materials from your doctor or pharmacy?: 3 - Sometimes (pt's daughter, mercy)   Interpreter Needed?: No   Information entered by :: alia t/cma     Activities of Daily Living      11/27/2023    9:36 AM 04/09/2023   10:24 PM  In your present state of health, do you have any difficulty performing the following activities:  Hearing? 0    Vision? 0    Difficulty concentrating or making decisions? 0    Walking or climbing stairs? 0    Dressing or bathing? 1    Comment pt's daughter, mercy    Doing errands, shopping? 1 1  Comment pt's daughter, Solicitor and eating ? N    Using the Toilet? N    In the past six months, have you accidently leaked urine? N    Do you have problems with loss of bowel control? N    Managing your Medications? Y    Managing your Finances? Y    Comment pt's daughter, Corporate treasurer or managing your Housekeeping? Y    Comment pt's daughter, angie        Patient Care Team: [name], DO as PCP - General (Family Medicine) Alvan Dorn FALCON, MD as PCP - Cardiology (Cardiology) Karenann Barnacle, PA-C as Physician Assistant (Orthopedic Surgery) Ernie Cough, MD as Consulting Physician (Orthopedic Surgery) Roddie Bring, DPM as Consulting Physician (Podiatry)   I have updated your Care Teams any recent Medical Services you may have received from other providers in the past year.       Assessment:    Assessment This is a routine wellness examination for [place].   Hearing/Vision screen Hearing Screening - Comments:: Pt denies hearing/pt needs ears cleaned  Vision Screening - Comments:: Pt wear glasses     Goals Addressed   None      Depression Screen      11/27/2023    9:41 AM 11/25/2023   11:07 AM 09/16/2023   11:32 AM 07/30/2023   10:12 AM 07/14/2023    2:55 PM 07/03/2023    8:41 AM 05/26/2023    9:59 AM  PHQ 2/9 Scores  PHQ - 2 Score 0  2 1 1 5   1   PHQ- 9 Score 0 4 3 4 14       Exception Documentation  Medical reason      Fall Risk      11/25/2023   11:07 AM 09/16/2023   11:27 AM 07/30/2023   10:14 AM 07/14/2023    2:55 PM 07/03/2023    8:41 AM  Fall Risk   Falls in the past year? 1 1 1 1 1   Number falls in past yr: 0 1 1 1 1   Injury with Fall? 0 1 1 1 1   Risk for fall due to :   Impaired balance/gait;Medication side effect;History of fall(s) History of fall(s);Impaired mobility;Medication side effect History of fall(s) History of fall(s)  Follow up Falls evaluation completed Falls evaluation completed Falls evaluation completed Education provided Falls evaluation completed      MEDICARE RISK AT HOME:  Medicare Risk at Home Any stairs in or around the home?: Yes If so, are there any without handrails?: Yes Home free of loose throw rugs in walkways, pet beds, electrical cords, etc?: Yes Adequate lighting in your home to reduce risk of falls?: Yes Life alert?: No Use of a cane, walker or w/c?: Yes Grab bars in the bathroom?: No Shower chair or bench in shower?: No Elevated toilet seat or a handicapped toilet?: No   TIMED UP AND GO:   Was the test performed?  No   Cognitive Function: Per pt's daughter, [name] stated that pt will not be able to do test.      07/30/2023   10:24 AM 08/09/2021    3:20 PM 12/17/2019    3:32 PM 08/20/2019    8:42 AM  MMSE - Mini Mental State Exam  Orientation to time 2 3 5 3   Orientation to Place 2 0 5 5  Registration 3 3 3  0  Attention/ Calculation 0 5 2 0  Recall 2 0 2 2  Language- name 2 objects 2 2 2 2   Language- repeat 1 1 1  0  Language- follow 3 step command 3 3 3 3   Language- read & follow direction 1 1 1 1   Write a sentence 0 1 1 0  Copy design 1 0 1 0  Total score 17 19 26 16         07/11/2022    2:28 PM 07/11/2022    2:27 PM 03/28/2021   12:10 PM 03/28/2021   11:53 AM  6CIT Screen  What Year? 0 points 0 points 0 points 0 points  What month? 0 points 0 points  0 points 0 points  What time? 3 points 0 points 0 points 0 points  Count back from 20 2 points 0 points 0 points 0 points  Months in reverse 0 points 0 points 4 points 4 points  Repeat phrase 4 points 0 points 2 points 2 points  Total Score 9 points 0 points 6 points 6 points      Immunizations     Immunization History  Administered Date(s) Administered   Fluad Quad(high Dose 65+) 12/05/2020, 11/19/2021   Fluzone Influenza virus vaccine,trivalent (IIV3), split virus 11/17/2014   INFLUENZA, HIGH DOSE SEASONAL PF 02/26/2012, 11/25/2013, 11/26/2014, 11/26/2015, 11/25/2016, 11/25/2017, 11/25/2023   Influenza Split 11/09/2012   Influenza,inj,Quad PF,6+ Mos 11/14/2015   Influenza-Unspecified 11/10/2013, 11/17/2014, 11/29/2016, 11/13/2017, 11/30/2018, 11/18/2020   Moderna Sars-Covid-2 Vaccination 04/27/2019, 05/25/2019, 01/25/2020   Novel Infuenza-h1n1-09 02/04/2008   Pfizer(Comirnaty)Fall Seasonal Vaccine 12 years and older 12/04/2021   Pneumococcal Conjugate-13 11/10/2013, 11/13/2013   Pneumococcal Polysaccharide-23 02/25/2006, 08/28/2006, 10/26/2013, 12/02/2016   Td (Adult),5 Lf Tetanus Toxid, Preservative Free 11/04/2006   Td,absorbed, Preservative Free,  Adult Use, Lf Unspecified 11/04/2006   Tdap 11/04/2006, 10/12/2010, 11/13/2017   Zoster Recombinant(Shingrix) 03/09/2021   Zoster, Live 10/27/2010      Screening Tests     Health Maintenance  Topic Date Due   COVID-19 Vaccine (5 - 2025-26 season) 12/11/2023 (Originally 10/27/2023)   Zoster Vaccines- Shingrix (2 of 2) 12/17/2023 (Originally 05/04/2021)   Medicare Annual Wellness (AWV)  11/26/2024   DTaP/Tdap/Td (5 - Td or Tdap) 11/14/2027   Pneumococcal Vaccine: 50+ Years  Completed   Influenza Vaccine  Completed   HPV VACCINES  Aged Out   Meningococcal B Vaccine  Aged Out      Health Maintenance Items Addressed: See Nurse Notes at the end of this note   Additional Screening:   Vision Screening: Recommended annual  ophthalmology exams for early detection of glaucoma and other disorders of the eye. Is the patient up to date with their annual eye exam?  Yes  Who is the provider or what is the name of the office in which the patient attends annual eye exams? N/a   Dental Screening: Recommended annual dental exams for proper oral hygiene   Community Resource Referral / Chronic Care Management: CRR required this visit?  No    CCM required this visit?  No     Plan:      I have personally reviewed and noted the following in the patient's chart:    Medical and social history Use of alcohol, tobacco or illicit drugs  Current medications and supplements including opioid prescriptions. Patient is not currently taking opioid prescriptions. Functional ability and status Nutritional status Physical activity Advanced directives List of other physicians Hospitalizations, surgeries, and ER visits in previous 12 months Vitals Screenings to include cognitive, depression, and falls Referrals and appointments   In addition, I have reviewed and discussed with patient certain preventive protocols, quality metrics, and best practice recommendations. A written personalized care plan for preventive services as well as general preventive health recommendations were provided to patient.     Ozie Ned, CMA                             11/27/2023    After Visit Summary: (MyChart) Due to this being a telephonic visit, the after visit summary with patients personalized plan was offered to patient via MyChart    Notes: Nothing significant to report at this time.

## 2023-12-09 DIAGNOSIS — M545 Low back pain, unspecified: Secondary | ICD-10-CM | POA: Diagnosis not present

## 2023-12-09 DIAGNOSIS — Z981 Arthrodesis status: Secondary | ICD-10-CM | POA: Diagnosis not present

## 2023-12-09 DIAGNOSIS — G894 Chronic pain syndrome: Secondary | ICD-10-CM | POA: Diagnosis not present

## 2023-12-19 ENCOUNTER — Other Ambulatory Visit: Payer: Self-pay | Admitting: Family Medicine

## 2023-12-19 ENCOUNTER — Other Ambulatory Visit: Payer: Self-pay | Admitting: Nurse Practitioner

## 2023-12-19 DIAGNOSIS — F331 Major depressive disorder, recurrent, moderate: Secondary | ICD-10-CM

## 2023-12-19 DIAGNOSIS — F411 Generalized anxiety disorder: Secondary | ICD-10-CM

## 2023-12-19 NOTE — Telephone Encounter (Signed)
 Copied from CRM [id number]. Topic: Clinical - Medication Refill >> Dec 19, 2023 10:48 AM Rosaria BRAVO wrote: Medication: traZODone  (DESYREL ) 50 MG tablet  Has the patient contacted their pharmacy? Yes (Agent: If no, request that the patient contact the pharmacy for the refill. If patient does not wish to contact the pharmacy document the reason why and proceed with request.) (Agent: If yes, when and what did the pharmacy advise?)  This is the patient's preferred pharmacy:  [id number] - [place], [place] - [address] [place] [address] Phone: [phone] Fax: [phone]  Is this the correct pharmacy for this prescription? Yes If no, delete pharmacy and type the correct one.   Has the prescription been filled recently? Yes  Is the patient out of the medication? Yes  Has the patient been seen for an appointment in the last year OR does the patient have an upcoming appointment? Yes  Can we respond through MyChart? Yes  Agent: Please be advised that Rx refills may take up to 3 business days. We ask that you follow-up with your pharmacy.

## 2023-12-19 NOTE — Telephone Encounter (Signed)
 LMOVM that pharmacy is requesting refills on pt's Sertraline  but that are asking for 50 mg & PCP increased her to 100 mg back in June and [facility]. Also the request for Trazodone , pt was taken off this 05/07/22 if pt is wanting to go back on this she will need to make an appt to discuss this before her March appt.

## 2023-12-25 ENCOUNTER — Ambulatory Visit: Admitting: Neurology

## 2023-12-25 ENCOUNTER — Encounter: Payer: Self-pay | Admitting: Neurology

## 2023-12-25 NOTE — Progress Notes (Deleted)
 Provider:  Dedra Gores, MD  Primary Care Physician:  Kaitlyn Kaitlyn HERO, DO [address] [place] [address]     Referring Provider: Jolinda Kaitlyn Serrano [address] [place],  [place] [address]          Chief Complaint according to patient   Patient presents with:     New Patient (Initial Visit)            HISTORY OF PRESENT ILLNESS:  Kaitlyn Serrano is a 83 y.o. female patient who is seen uponGottschalk, Kaitlyn Serrano [address] [place],  [place] [address]'d referral on 12/25/2023  for an Evaluation of COGNITIVE CONCERNS .  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  The following examples were provided:     Med History review: There have been no traumatic brain injuries, prolonged surgeries under general anesthesia, radiation or chemotherapy,  substance abuse, or mental illness. She has chronic pain, is treated with narcotic pain medications  as of July 2025.  She has chronic kidney disease, creatinine was 1.37 in July 2025.   ( Reviewed evidence  or documentation of : PTSD, Trauma such as TBI/ whiplash,  Autoimmune disorders, cancer,  Vitamin deficiency,  Anemia, malabsorption,  GERD,  Thyroid  disease or other endocrinological disorders, Substance Abuse, Mood disorders, Depression.) Current medications as listed did not contain anticholinergic substances,     Kaitlyn Serrano is reporting independence in the following activities of daily living:    Listing activities of daily living questions-    The patient drives/ or member of the household drives:  She goes shopping or orders items for daily needs,   She has access to fresh food in the home,  and eats regular meals.  Meals are prepared by the patient or in the household. The patient eats out. The patient eats processed food : fast food or canned food, frozen meals.   Communication: able to use a land line telephone, a mobile phone, or a computer. He/She makes appointments by phone or online and keeps appointments.  Reports independence in bill paying, banking, and files paperwork such as for insurance or taxes.  Dressing, toileting, bathing , feeding,   Ambulation : Falls ? Syncope?  ROM restriction:      Family history : Impaired cognitive function or dementia were affecting the following biological  family members:     .Social history: Kaitlyn Serrano's family status is divorced , has adult twin daughter, and lives in a private  household with  persons/ alone/ with pets. Kaitlyn Serrano has  support nearby provided by one of her daughters.   Educational  history: Patient attended ... years of education, worked as a *** , and reported no difficulties with learning, attention, vision or hearing at the time of  training.  Patient was working as / is retired from [employer] . The occupational environment included exposure to...  There was kimberly-clark time.  Nicotine use: remote .   ETOH use ; seldomly,  Substance use otherwise:  no histroy of abuse  Caffeine  intake in form of Coffee( ***) Soda( ***) Tea ( ***) or energy drinks.  Physical activity in form of ***.   Hobbies and Social activities:   The daily routines are well established, meal times, bedtime, rise time.  The patient reports insomnia and takes medication.    The referring physician, Dr Kaitlyn ,has kindly provided the following clinical history information and evolution of symptoms , imaging  and test results:   MMSE : 17 points  on 07-30-2023  MOCA: not attempted, due to low MMSE score.   CT head: COMPARISON:  09/19/2022   FINDINGS: Brain: Age related volume loss. Chronic small-vessel  ischemic changes the white matter. No sign of acute infarction, mass lesion, hemorrhage, hydrocephalus or extra-axial collection.   Vascular: There is atherosclerotic calcification of the major vessels at the base of the brain.   Skull: Negative   Sinuses/Orbits: Clear/normal   Other: None   IMPRESSION: No acute CT finding. Age related volume loss. Chronic small-vessel ischemic changes of the white matter.     Electronically Signed   By: Kaitlyn Serrano M.D.   On: 04/01/2023 20:02  MRI brain:       Review of Systems: Out of a complete 14 system review, the patient complains of only the following symptoms, and all other reviewed systems are negative.:  See above    Social History   Socioeconomic History   Marital status: Divorced    Spouse name: Not on file   Number of children: 2   Years of education: Not on file   Highest education level: Not on file  Occupational History   Not on file  Tobacco Use   Smoking status: Former    Current packs/day: 0.00    Average packs/day: 4.0 packs/day for 3.7 years (14.8 ttl pk-yrs)    Types: Cigarettes    Start date: 06/10/1960    Quit date: 02/20/1964    Years since quitting: 59.8   Smokeless tobacco: Never  Vaping Use   Vaping status: Never Used  Substance and Sexual Activity   Alcohol use: No    Alcohol/week: 0.0 standard drinks of alcohol   Drug use: No   Sexual activity: Not Currently    Birth control/protection: None  Other Topics Concern   Not on file  Social History Narrative   ** Merged History Encounter **       ** Merged History Encounter **  [name] lives alone. She is separated/divorced from her second husband and widowed by her first husband. She has twin daughters, Kaitlyn Serrano and Kaitlyn Serrano. Kaitlyn Serrano has been a cause of stress for her and she believes was preven   ting her from getting medical care that she needed. She is no longer talking to Kaitlyn Serrano. She states that Kaitlyn Serrano is involved in her care and that she  trusts her to have her best interest in mind.      Social Drivers of Corporate Investment Banker Strain: Low Risk  (11/27/2023)   Overall Financial Resource Strain (CARDIA)    Difficulty of Paying Living Expenses: Not hard at all  Food Insecurity: Food Insecurity Present (11/27/2023)   Hunger Vital Sign    Worried About Running Out of Food in the Last Year: Sometimes true    Ran Out of Food in the Last Year: Sometimes true  Transportation Needs: No Transportation Needs (11/27/2023)   PRAPARE - Administrator, Civil Service (Medical): No    Lack of Transportation (Non-Medical): No  Physical  Activity: Insufficiently Active (11/27/2023)   Exercise Vital Sign    Days of Exercise per Week: 3 days    Minutes of Exercise per Session: 30 min  Stress: No Stress Concern Present (11/27/2023)   [name] - Occupational Stress Questionnaire    Feeling of Stress: Not at all  Social Connections: Socially Isolated (11/27/2023)   Social Connection and Isolation Panel    Frequency of Communication with Friends and Family: More than three times a week    Frequency of Social Gatherings with Friends and Family: More than three times a week    Attends Religious Services: Patient declined    Database Administrator or Organizations: No    Attends Banker Meetings: Never    Marital Status: Widowed    Family History  Problem Relation Age of Onset   Diabetes Mother    Heart disease Mother    Hypertension Mother    Gallbladder disease Mother    Heart disease Father    Pneumonia Sister    Breast cancer Paternal Grandmother    Hypertension Brother    Hyperlipidemia Brother    Hypertension Child    Gallbladder disease Other     Past Medical History:  Diagnosis Date   Acute systolic heart failure (HCC) 04/03/2023   Anxiety    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 family unaware   Dysphagia    Dyspnea    Generalized weakness    GERD (gastroesophageal reflux disease)    GERD (gastroesophageal reflux disease)    High cholesterol    Hoarseness    Hx of cardiovascular stress test    negative 2012 and 2016   Hypertension    Insomnia    Intervertebral disc disorder with myelopathy, lumbar region    Lumbago    Lumbar spine pain    Non-ST elevation (NSTEMI) myocardial infarction (HCC) 04/03/2023   PONV (postoperative nausea and vomiting)    confusion after anesthesia   Renal insufficiency    Ringing in ears    resolved   Spondylosis with myelopathy, lumbar region    legs, back   Syncope and collapse 04/09/2023    Past Surgical History:  Procedure Laterality Date   ABDOMINAL HYSTERECTOMY     BACK SURGERY     BONE MARROW ASPIRATION     CERVICAL SPINE SURGERY     EYE SURGERY Bilateral    Lasic   FIXATION KYPHOPLASTY     LAMINOTOMY     LEFT HEART CATH AND CORONARY ANGIOGRAPHY N/A 04/03/2023   Procedure: LEFT HEART CATH AND CORONARY ANGIOGRAPHY;  Surgeon: Mady Bruckner, MD;  Location: [facility];  Service: Cardiovascular;  Laterality: N/A;   SPINAL FUSION     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   Procedure: TOTAL HIP ARTHROPLASTY ANTERIOR APPROACH;  Surgeon: Ernie Cough, MD;  Location: [facility];  Service: Orthopedics;  Laterality: Right;  70 mins   TOTAL HIP ARTHROPLASTY Left 05/14/2022   Procedure: TOTAL HIP ARTHROPLASTY ANTERIOR APPROACH;  Surgeon: Ernie Cough, MD;  Location: [facility];  Service: Orthopedics;  Laterality: Left;     Current Outpatient Medications on File Prior to Visit  Medication Sig Dispense Refill   aspirin  EC 81 MG tablet Take 81 mg by mouth daily. Swallow whole.     busPIRone  (BUSPAR ) 10 MG tablet Take  1 tablet (10 mg total) by mouth 3 (three) times daily. 270 tablet 3   donepezil  (ARICEPT ) 10 MG tablet Take one tablet by mouth daily at 9pm bedtime for  Dementia 90 tablet 1   ezetimibe  (ZETIA ) 10 MG tablet TAKE ONE TABLET BY MOUTH DAILY AT 9AM. NEEDS APPOINTMENT BEFORE FURTHER REFILLS, 2ND ATTEMPT 90 tablet 3   HYDROcodone -acetaminophen  (NORCO) 10-325 MG tablet Take 1 tablet by mouth every 4 (four) hours as needed for severe pain (pain score 7-10).     lidocaine  (LIDODERM ) 5 % 1 PATCH ONTO THE SKIN DAILY AS NEEDED FOR PAIN. REMOVE & DISCARD PATCH WITHIN 12 HOURS OR AS DIRECTED BY MDA 30 patch PRN   losartan  (COZAAR ) 100 MG tablet Take 1 tablet (100 mg total) by mouth daily. 90 tablet 3   metoprolol  succinate (TOPROL  XL) 25 MG 24 hr tablet Take 1 tablet (25 mg total) by mouth daily. 90 tablet 3   pantoprazole  (PROTONIX ) 40 MG tablet Take 1 tablet (40 mg total) by mouth 2 (two) times daily. 180 tablet 3   polyethylene glycol (MIRALAX  / GLYCOLAX ) 17 g packet Take 17 g by mouth daily as needed for moderate constipation. 30 each PRN   sertraline  (ZOLOFT ) 100 MG tablet Take 1 tablet (100 mg total) by mouth daily. **dose change 90 tablet 3   Suvorexant  (BELSOMRA ) 5 MG TABS Take 1 tablet (5 mg total) by mouth at bedtime as needed (sleep to replace Ramelteon ). 30 tablet 5   No current facility-administered medications on file prior to visit.    Allergies  Allergen Reactions   Bee Venom Anaphylaxis   Penicillins Anaphylaxis and Rash   Amlodipine  Swelling   Elavil  [Amitriptyline  Hcl] Other (See Comments)    Confusion, hallucinations   Statins Swelling and Other (See Comments)    Peeling of skin     DIAGNOSTIC DATA (LABS, IMAGING, TESTING) - I reviewed patient records, labs, notes, testing and imaging myself where available.  Lab Results  Component Value Date   WBC 7.6 11/25/2023   HGB 12.6 11/25/2023   HCT 40.2 11/25/2023   MCV 94 11/25/2023   PLT 271 11/25/2023      Component Value Date/Time   NA 139 11/25/2023 1022   K 4.3 11/25/2023 1022   CL 101 11/25/2023 1022   CO2 23 11/25/2023 1022   GLUCOSE 88 11/25/2023 1022   GLUCOSE 110  (H) 09/20/2023 1956   BUN 17 11/25/2023 1022   CREATININE 0.79 11/25/2023 1022   CALCIUM 9.5 11/25/2023 1022   PROT 6.9 11/25/2023 1022   ALBUMIN  4.2 11/25/2023 1022   AST 29 11/25/2023 1022   ALT 13 11/25/2023 1022   ALKPHOS 139 (H) 11/25/2023 1022   BILITOT 0.7 11/25/2023 1022   GFRNONAA 39 (L) 09/20/2023 1956   GFRAA 97 04/18/2020 1535   Lab Results  Component Value Date   CHOL 196 11/25/2023   HDL 72 11/25/2023   LDLCALC 108 (H) 11/25/2023   TRIG 89 11/25/2023   CHOLHDL 2.7 11/25/2023   Lab Results  Component Value Date   HGBA1C 5.4 05/08/2022   Lab Results  Component Value Date   VITAMINB12 319 04/04/2023   Lab Results  Component Value Date   TSH 0.829 10/24/2020    PHYSICAL EXAM:  Tabular:  No data found.   There is no height or weight on file to calculate BMI.   Wt Readings from Last 3 Encounters:  11/27/23 125 lb (56.7 kg)  11/25/23 125 lb 4 oz (56.8 kg)  09/20/23 117 lb (53.1 kg)     Ht Readings from Last 3 Encounters:  11/27/23 5' 2 (1.575 m)  11/25/23 5' 2 (1.575 m)  09/20/23 5' 2 (1.575 m)      Cardiovascular:  Regular rate and cardiac rhythm by pulse,  without distended neck veins. Respiratory: no tachypnoea or wheezing. .  Skin:  Without evidence of ankle edema, rash, bruising  The patient's posture was *** erect.   NEUROLOGIC EXAM: The patient was awake and alert, oriented to place and time.   Attention span & concentration ability appeared normal.  Speech was fluent, without dysarthria, dysphonia or aphasia, and of normal volume.     Cranial nerves:  There was no *** loss of smell or taste reported  Pupils are round, equal in size and briskly reactive to light.  Funduscopic exam was deferred***.  Extraocular movements in vertical and horizontal planes were intact and without nystagmus. (No Diplopia reported). Visual fields by finger perimetry are intact. Hearing was intact to soft voice.    Facial sensation intact ( fine  touch).  Facial motor strength: Symmetric movement and tongue and uvula move midline.  Neck ROM: rotation, tilt and flexion /extension were observed,  shoulder shrug was symmetrical.    Motor exam:  Symmetric bulk, strength and ROM.   Muscle tone was  without cog- wheeling, and there was symmetric grip strength.   Sensory:  Fine touch and vibration were tested by tuning fork and intact.  Proprioception tested in the upper extremities was normal.   Coordination: The patient reported no problems with button closure and no changes to penmanship.   The Finger-to-nose maneuver was intact without evidence of ataxia, dysmetria or tremor.   Gait and station: Patient could rise unassisted from a seated position, without *** bracing, and walked without*** assistive device.  Stance was of normal/ wide width. The patient turned with *** steps. Toe and heel walk were ***deferred. No limp was noted.  Arm swing was preserved. The patient's gait posture was erect/stooped***.   Deep tendon reflexes: Upper extremities did show symmetric DTRs. Lower extremity DTRs were symmetric and brisk/ attenuated.   Babinski response was deferred .    ASSESSMENT  :   Dear Kaitlyn Kaitlyn HERO, Do [address] [place],  [place] [address],   Thank you for entrusting me with your patient's care.   As you know, your patient  Kaitlyn Serrano is a 83 y.o. female presented here on 12-25-2023 for an Evaluation of dementia  upon your request .  In summary, she was assessed with the following  :      1) ***  2)  3) The patient is on narcotic pain medication. Antidepressants, and has poor kidney function.   My Plan is to proceed with: No orders of the defined types were placed in this encounter.   1) GNA DEMENTIA PANEL  2) ATN 3) Late onset ALZHEIMER Genetic risk  4) EEG 5) MRI brain with and without , if not already done ( we only have access to CT scans )  6 ) Start Donepezil  (unless patient has bradycardia , IBS or  REM BD) at 5 mg for 90 days, then increase to 10 mg- if tolerated. 7) Referral to neuropsychology for cognitive testing. 8) Refer for vision and audiology testing if applicable.   The plan is to identify cognitive domains affected by changes, to rule out brain lesions or vascular abnormalities, to obtain testing of  AD bio-markers, genetic markers and ,  if applicable , consolidate these findings by a PET scan.  Serial testing  by [facility] or MMSE test is needed for all patients that are currently scoring in the subjective memory loss, MCI, or mild dementia category.   A referral for neuropsychological interview and testing battery  has been ordered.  Medication to help slow cognitive decline are available but may not be tolerated, these are available in oral and patch form and their use is not specific to a single form of neurodegenerative cognitive disorder.   Early stages and mild stages of AD may qualify for infusion therapy, based on MOCA/MMSE score and risk status for brain bleeding (AIDA) .   I plan to follow up  through our NP or personally within *** month.    The patient's condition may require frequent monitoring and adjustments in the treatment plan, reflecting the ongoing complexity of care.  This provider is for the named interval time the continuing focal point for associated medical /neurological needs services for this condition.   A total time of  ***  minutes consistent of a part of face to face encounter , exam and interview,  and additional preparation time for chart review was spent. Additionally, the following were reviewed: Past medical records, past medical and surgical history, family and social background, as well as relevant laboratory results, imaging findings, and medical notes, where applicable.  At today's visit, we discussed treatment options, associated risk and benefits, and engage in counseling as needed: Including, but not limited to driving safety, home safety, the  benefit of routines and activities.   Memory strategies were provided in the patient education attachment.   This note was generated in part by using dictation software, and as a result, it may contain unintentional typos and errors.  Nevertheless, effort was made to accurately convey the pertinent aspects of the patient's visit.    Electronically signed by: Kaitlyn Gores, MD 12/25/2023 8:14 AM  [facility] Board certified by The [employer] of Sleep Medicine and Diplomate of the [employer] of Sleep Medicine. Board certified In Neurology through the ABPN, Fellow of the [employer] of Neurology. [email or web address].       Tips for Healthy Aging:   Read the MIND DIET book for nutritional information.  Regular physical activity and daylight exposure. This lifts the mood and entrains a circadian rhythm, leading to better sleep and frees up the mind.   Walking a minimum of  20 minutes a day , if possible outdoors, preferable in a park or nature area.  Indoor exercised such as stretching , yoga , stationary bike or stair master ( at the gym). Read !  Keep up with daily events , news.  Maintain or establish new Hobbies , consider Volunteering, and consider becoming a member of a club, church or other community memberships and engagements. Social interactions give purpose, joy and are interactive - Interaction is brain jogging!   Reconsider your driving ability-   Reconsider your home : The home is a single storey ( ranch/ apartment) ?  If you live in a multistory residence, are all stairs equipped with solid handrails on both sides ?  Can any existing staircase be retrofitted with a stair lift .  Do you need an entry ramp ?  The home that is suited to aging in space has low or no barriers to enter , to reach the lavatory  ( wide doors , high toilet seat, handrails), has a  no barrier shower ( no tub ) with a scold guard water  temperature  setting, handheld shower head , and a freestanding shower seat ( consider a plastic garden armchair !) .   And are the doors wide enough to pass with walker or wheelchair?  Is there enough space in the bedroom to reach the bed from two or better three sides.  De- clutter and arrange your home for safety: place objects at eye height , not above- not below hip height-  you should not need to use a stepstool or ladder.   No overlapping area rugs, sufficient light, and passage space free of furniture , remove breakable items from side tables, night stands.  A Kitchen stovetop powered by induction prevents injuries and fires- replace gas tops and toaster ovens.

## 2024-01-03 ENCOUNTER — Other Ambulatory Visit: Payer: Self-pay | Admitting: Family Medicine

## 2024-01-03 ENCOUNTER — Other Ambulatory Visit: Payer: Self-pay | Admitting: *Deleted

## 2024-01-03 DIAGNOSIS — B9689 Other specified bacterial agents as the cause of diseases classified elsewhere: Secondary | ICD-10-CM

## 2024-01-03 DIAGNOSIS — N898 Other specified noninflammatory disorders of vagina: Secondary | ICD-10-CM

## 2024-01-03 DIAGNOSIS — R300 Dysuria: Secondary | ICD-10-CM

## 2024-01-03 DIAGNOSIS — F411 Generalized anxiety disorder: Secondary | ICD-10-CM

## 2024-01-03 DIAGNOSIS — F331 Major depressive disorder, recurrent, moderate: Secondary | ICD-10-CM

## 2024-01-05 ENCOUNTER — Other Ambulatory Visit: Payer: Self-pay | Admitting: Family Medicine

## 2024-01-05 DIAGNOSIS — F331 Major depressive disorder, recurrent, moderate: Secondary | ICD-10-CM

## 2024-01-05 DIAGNOSIS — F411 Generalized anxiety disorder: Secondary | ICD-10-CM

## 2024-01-08 ENCOUNTER — Other Ambulatory Visit: Payer: Self-pay | Admitting: Family Medicine

## 2024-01-08 DIAGNOSIS — F03B11 Unspecified dementia, moderate, with agitation: Secondary | ICD-10-CM

## 2024-01-08 DIAGNOSIS — F5101 Primary insomnia: Secondary | ICD-10-CM

## 2024-01-08 DIAGNOSIS — K219 Gastro-esophageal reflux disease without esophagitis: Secondary | ICD-10-CM

## 2024-01-08 DIAGNOSIS — F331 Major depressive disorder, recurrent, moderate: Secondary | ICD-10-CM

## 2024-01-08 DIAGNOSIS — T148XXA Other injury of unspecified body region, initial encounter: Secondary | ICD-10-CM

## 2024-01-08 DIAGNOSIS — R0789 Other chest pain: Secondary | ICD-10-CM

## 2024-01-08 DIAGNOSIS — F411 Generalized anxiety disorder: Secondary | ICD-10-CM

## 2024-01-08 NOTE — Telephone Encounter (Unsigned)
 Copied from CRM #[id number]. Topic: Clinical - Medication Refill >> Jan 08, 2024 10:34 AM Delon T wrote: Medication: busPIRone  (BUSPAR ) 10 MG tablet donepezil  (ARICEPT ) 10 MG tablet  lidocaine  (LIDODERM ) 5 % losartan  (COZAAR ) 100 MG tablet  pantoprazole  (PROTONIX ) 40 MG tablet polyethylene glycol (MIRALAX  / GLYCOLAX ) 17 g packet sertraline  (ZOLOFT ) 100 MG tablet  Suvorexant  (BELSOMRA ) 5 MG TABS  Has the patient contacted their pharmacy? No (Agent: If no, request that the patient contact the pharmacy for the refill. If patient does not wish to contact the pharmacy document the reason why and proceed with request.) (Agent: If yes, when and what did the pharmacy advise?)  This is the patient's preferred pharmacy:  [id number] - [place] [place] [place] [address] Phone: [phone] Fax: [phone]   Is this the correct pharmacy for this prescription? Yes If no, delete pharmacy and type the correct one.   Has the prescription been filled recently? Yes  Is the patient out of the medication? Yes  Has the patient been seen for an appointment in the last year OR does the patient have an upcoming appointment? Yes  Can we respond through MyChart? Yes  Agent: Please be advised that Rx refills may take up to 3 business days. We ask that you follow-up with your pharmacy.

## 2024-01-09 MED ORDER — DONEPEZIL HCL 10 MG PO TABS: ORAL_TABLET | ORAL | 1 refills | Status: AC

## 2024-01-09 NOTE — Telephone Encounter (Signed)
 LMOVM that all meds except the Donepezil  have refills on them at the pharmacy. I have sent in refills for this.

## 2024-01-19 ENCOUNTER — Telehealth: Payer: Self-pay | Admitting: Family Medicine

## 2024-01-19 NOTE — Telephone Encounter (Signed)
 Digital for visit tomorrow

## 2024-01-19 NOTE — Telephone Encounter (Signed)
 Copied from CRM [id number]. Topic: General - Other >> Jan 19, 2024  9:49 AM Rosaria A wrote: Reason for CRM: Patients daughter [name] is requesting a call back from Dr. Jolinda. Jon states that it is urgent for the patients PCP to call her back today. She would not explain to me why she is needing to speak with the patients PCP. Please call Jon back at  [phone].

## 2024-01-20 ENCOUNTER — Ambulatory Visit: Admitting: Family Medicine

## 2024-01-21 ENCOUNTER — Encounter: Payer: Self-pay | Admitting: Family Medicine

## 2024-01-21 ENCOUNTER — Ambulatory Visit: Admitting: Family Medicine

## 2024-01-21 VITALS — BP 166/83 | HR 76 | Temp 97.6°F | Ht 62.0 in | Wt 128.5 lb

## 2024-01-21 DIAGNOSIS — I10 Essential (primary) hypertension: Secondary | ICD-10-CM

## 2024-01-21 DIAGNOSIS — F03B11 Unspecified dementia, moderate, with agitation: Secondary | ICD-10-CM | POA: Diagnosis not present

## 2024-01-21 DIAGNOSIS — H6123 Impacted cerumen, bilateral: Secondary | ICD-10-CM | POA: Diagnosis not present

## 2024-01-21 DIAGNOSIS — R300 Dysuria: Secondary | ICD-10-CM

## 2024-01-21 LAB — URINALYSIS, COMPLETE
Glucose, UA: NEGATIVE
Leukocytes,UA: NEGATIVE
Nitrite, UA: NEGATIVE
RBC, UA: NEGATIVE
Specific Gravity, UA: >=1.03 — AB (ref 1.005–1.030)
Urobilinogen, Ur: 1 mg/dL (ref 0.2–1.0)
pH, UA: 5.5 (ref 5.0–7.5)

## 2024-01-21 MED ORDER — HYDRALAZINE HCL 25 MG PO TABS: 25.0000 mg | ORAL_TABLET | Freq: Three times a day (TID) | ORAL | 1 refills | Status: DC

## 2024-01-21 NOTE — Progress Notes (Signed)
 Subjective: CC: Placement PCP: Kaitlyn Norene HERO, DO YEP:[name] is a 83 y.o. female presenting to clinic today for:  Patient is brought in by her daughter Kaitlyn Serrano, who is her power of attorney.  She reports that patient has been having increasing wandering behaviors, worsening dementia.  Her daughters are concerned that she is now becoming too much for them to handle at home and are seeking placement.  They have got in contact with some of the local nursing homes with memory units and was told that she would likely need Medicaid.  However, she has several properties so these will have to be transitioned out of her name before they can apply for this.  For now, they are holding off on moving her but did want to get things started.  She is incontinent and typically wears depends pads for both urinary and fecal incontinence.  She is able to self bathe, self dress and self feed but does not prepare her own meals.  She does have wandering behaviors.  She does have lability in mood.  No apparent visual or auditory hallucinations.  No wounds or breakdown in skin.  She ambulates independently with minimal assistance.  Today, patient reports that she is been having some intermittent dysuria but no fevers, chills, nausea, vomiting or hematuria.  She also reports some difficulty with hearing and thinks that maybe she has earwax buildup.  Does not wear hearing aids.  She does wear eyeglasses for reading.   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 family unaware   Dysphagia    Dyspnea    Generalized weakness    GERD (gastroesophageal reflux disease)    GERD (gastroesophageal reflux disease)    High cholesterol    Hoarseness    Hx of cardiovascular stress test    negative 2012 and 2016   Hypertension    Insomnia    Intervertebral disc disorder with myelopathy, lumbar region    Lumbago    Lumbar spine pain    Non-ST elevation (NSTEMI) myocardial infarction (HCC) 04/03/2023   PONV (postoperative nausea and vomiting)    confusion after anesthesia   Renal insufficiency    Ringing in ears    resolved   Spondylosis with myelopathy, lumbar region    legs, back   Syncope and collapse 04/09/2023    Current Outpatient Medications:    aspirin  EC 81 MG tablet, Take 81 mg by mouth daily. Swallow whole., Disp: , Rfl:    busPIRone  (BUSPAR ) 10 MG tablet, Take 1 tablet (10 mg total) by mouth 3 (three) times daily., Disp: 270 tablet, Rfl: 3   donepezil  (ARICEPT ) 10 MG tablet, Take one tablet by mouth daily at 9pm bedtime for Dementia, Disp: 90 tablet, Rfl: 1   ezetimibe  (ZETIA ) 10 MG tablet, TAKE ONE TABLET BY MOUTH DAILY AT 9AM. NEEDS APPOINTMENT BEFORE FURTHER REFILLS, 2ND ATTEMPT, Disp: 90 tablet, Rfl: 3   HYDROcodone -acetaminophen  (NORCO)  10-325 MG tablet, Take 1 tablet by mouth every 4 (four) hours as needed for severe pain (pain score 7-10)., Disp: , Rfl:    lidocaine  (LIDODERM ) 5 %, 1 PATCH ONTO THE SKIN DAILY AS NEEDED FOR PAIN. REMOVE & DISCARD PATCH WITHIN 12 HOURS OR AS DIRECTED BY MDA, Disp: 30 patch, Rfl: PRN   losartan  (COZAAR ) 100 MG tablet, Take 1 tablet (100 mg total) by mouth daily., Disp: 90 tablet, Rfl: 3   metoprolol  succinate (TOPROL  XL) 25 MG 24 hr tablet, Take 1 tablet (25 mg total) by mouth daily., Disp: 90 tablet, Rfl: 3   pantoprazole  (PROTONIX ) 40 MG tablet, Take 1 tablet (40 mg total) by mouth 2 (two) times daily., Disp: 180 tablet, Rfl: 3    polyethylene glycol (MIRALAX  / GLYCOLAX ) 17 g packet, Take 17 g by mouth daily as needed for moderate constipation., Disp: 30 each, Rfl: PRN   sertraline  (ZOLOFT ) 100 MG tablet, Take 1 tablet (100 mg total) by mouth daily. **dose change, Disp: 90 tablet, Rfl: 3   Suvorexant  (BELSOMRA ) 5 MG TABS, Take 1 tablet (5 mg total) by mouth at bedtime as needed (sleep to replace Ramelteon )., Disp: 30 tablet, Rfl: 5 Social History   Socioeconomic History   Marital status: Divorced    Spouse name: Not on file   Number of children: 2   Years of education: Not on file   Highest education level: Not on file  Occupational History   Not on file  Tobacco Use   Smoking status: Former    Current packs/day: 0.00    Average packs/day: 4.0 packs/day for 3.7 years (14.8 ttl pk-yrs)    Types: Cigarettes    Start date: 06/10/1960    Quit date: 02/20/1964    Years since quitting: 59.9   Smokeless tobacco: Never  Vaping Use   Vaping status: Never Used  Substance and Sexual Activity   Alcohol use: No    Alcohol/week: 0.0 standard drinks of alcohol   Drug use: No   Sexual activity: Not Currently    Birth control/protection: None  Other Topics Concern   Not on file  Social History Narrative   ** Merged History Encounter **       ** Merged History Encounter **  Kaitlyn Serrano lives alone. She is separated/divorced from her second husband and widowed by her first husband. She has twin daughters, Kaitlyn Serrano and Kaitlyn Serrano. Kaitlyn Serrano has been a cause of stress for her and she believes was preven   ting her from getting medical care that she needed. She is no longer talking to [place]. She states that Kaitlyn Serrano is involved in her care and that she trusts her to have her best interest in mind.      Social Drivers of Corporate Investment Banker Strain: Low Risk  (11/27/2023)   Overall Financial Resource Strain (CARDIA)    Difficulty of Paying Living Expenses: Not hard at all  Food Insecurity: Food Insecurity Present (11/27/2023)    Hunger Vital Sign    Worried About Running Out of Food in the Last Year: Sometimes true    Ran Out of Food in the Last Year: Sometimes true  Transportation Needs: No Transportation Needs (11/27/2023)   PRAPARE - Administrator, Civil Service (Medical): No    Lack of Transportation (Non-Medical): No  Physical Activity: Insufficiently Active (11/27/2023)   Exercise Vital Sign    Days of Exercise per Week: 3 days    Minutes of Exercise per Session: 30  min  Stress: No Stress Concern Present (11/27/2023)   [name] - Occupational Stress Questionnaire    Feeling of Stress: Not at all  Social Connections: Socially Isolated (11/27/2023)   Social Connection and Isolation Panel    Frequency of Communication with Friends and Family: More than three times a week    Frequency of Social Gatherings with Friends and Family: More than three times a week    Attends Religious Services: Patient declined    Database Administrator or Organizations: No    Attends Banker Meetings: Never    Marital Status: Widowed  Intimate Partner Violence: Not At Risk (11/27/2023)   Humiliation, Afraid, Rape, and Kick questionnaire    Fear of Current or Ex-Partner: No    Emotionally Abused: No    Physically Abused: No    Sexually Abused: No   Family History  Problem Relation Age of Onset   Diabetes Mother    Heart disease Mother    Hypertension Mother    Gallbladder disease Mother    Heart disease Father    Pneumonia Sister    Breast cancer Paternal Grandmother    Hypertension Brother    Hyperlipidemia Brother    Hypertension Child    Gallbladder disease Other     Objective: Office vital signs reviewed. BP (!) 169/82   Pulse 76   Temp 97.6 F (36.4 C)   Ht 5' 2 (1.575 m)   Wt 128 lb 8 oz (58.3 kg)   SpO2 99%   BMI 23.50 kg/m   Physical Examination:  General: Awake, alert, nontoxic elderly female, No acute distress HEENT: Dark cerumen securing  TMs bilaterally Cardio: regular rate and rhythm, S1S2 heard, no murmurs appreciated Pulm: clear to auscultation bilaterally, no wheezes, rhonchi or rales; normal work of breathing on room air GU: No suprapubic tenderness palpation.  No CVA tenderness palpation. Neuro: Pleasant, interactive.  Does not appear to be responding to internal stimuli.  Gait is somewhat unstable but she relatively walks independently with minimal assistance.  Assessment/ Plan: 83 y.o. female   Dysuria - Plan: Urinalysis, Complete, Urine Culture  Moderate dementia with agitation, unspecified dementia type (HCC)  Bilateral impacted cerumen  Essential hypertension - Plan: hydrALAZINE  (APRESOLINE ) 25 MG tablet   Urinalysis without evidence of UTI.  Could not send culture due to inadequate sample.  I have completed [place] 2 form and placed on Kaitlyn Serrano's desk with details of what patient's needs will be.  Irrigation for cerumen impaction  Blood pressure not controlled so I have added hydralazine  as she has had swelling with Norvasc  in the past and is on max dose losartan .  I do not think a diuretic would be appropriate for this patient who is already frail  1 month recheck of blood pressure   Kaitlyn Serrano Fielding, DO [facility] [phone]

## 2024-01-21 NOTE — Patient Instructions (Signed)
 Blood pressure is uncontrolled despite use of losartan .  I am adding hydralazine  to take 3 times daily for blood pressure.  Please monitor blood pressures closely.  Please limit salt intake

## 2024-01-25 ENCOUNTER — Emergency Department (HOSPITAL_COMMUNITY)
Admission: EM | Admit: 2024-01-25 | Discharge: 2024-01-29 | Disposition: A | Attending: Emergency Medicine | Admitting: Emergency Medicine

## 2024-01-25 ENCOUNTER — Other Ambulatory Visit: Payer: Self-pay

## 2024-01-25 ENCOUNTER — Encounter (HOSPITAL_COMMUNITY): Payer: Self-pay | Admitting: Emergency Medicine

## 2024-01-25 ENCOUNTER — Emergency Department (HOSPITAL_COMMUNITY)

## 2024-01-25 DIAGNOSIS — I10 Essential (primary) hypertension: Secondary | ICD-10-CM | POA: Insufficient documentation

## 2024-01-25 DIAGNOSIS — Z79899 Other long term (current) drug therapy: Secondary | ICD-10-CM | POA: Insufficient documentation

## 2024-01-25 DIAGNOSIS — I2510 Atherosclerotic heart disease of native coronary artery without angina pectoris: Secondary | ICD-10-CM | POA: Insufficient documentation

## 2024-01-25 DIAGNOSIS — F22 Delusional disorders: Secondary | ICD-10-CM | POA: Diagnosis not present

## 2024-01-25 DIAGNOSIS — F03918 Unspecified dementia, unspecified severity, with other behavioral disturbance: Secondary | ICD-10-CM

## 2024-01-25 DIAGNOSIS — Z96643 Presence of artificial hip joint, bilateral: Secondary | ICD-10-CM | POA: Insufficient documentation

## 2024-01-25 DIAGNOSIS — I5021 Acute systolic (congestive) heart failure: Secondary | ICD-10-CM | POA: Insufficient documentation

## 2024-01-25 DIAGNOSIS — I110 Hypertensive heart disease with heart failure: Secondary | ICD-10-CM | POA: Insufficient documentation

## 2024-01-25 DIAGNOSIS — Z7982 Long term (current) use of aspirin: Secondary | ICD-10-CM | POA: Insufficient documentation

## 2024-01-25 DIAGNOSIS — F03911 Unspecified dementia, unspecified severity, with agitation: Secondary | ICD-10-CM | POA: Diagnosis present

## 2024-01-25 LAB — COMPREHENSIVE METABOLIC PANEL WITH GFR
ALT: 12 U/L (ref 0–44)
AST: 32 U/L (ref 15–41)
Albumin: 4.3 g/dL (ref 3.5–5.0)
Alkaline Phosphatase: 115 U/L (ref 38–126)
Anion gap: 14 (ref 5–15)
BUN: 12 mg/dL (ref 8–23)
CO2: 23 mmol/L (ref 22–32)
Calcium: 9.8 mg/dL (ref 8.9–10.3)
Chloride: 105 mmol/L (ref 98–111)
Creatinine, Ser: 0.72 mg/dL (ref 0.44–1.00)
GFR, Estimated: >60 mL/min (ref >60)
Glucose, Bld: 105 mg/dL — ABNORMAL HIGH (ref 70–99)
Potassium: 3.5 mmol/L (ref 3.5–5.1)
Sodium: 142 mmol/L (ref 135–145)
Total Bilirubin: 0.8 mg/dL (ref 0.0–1.2)
Total Protein: 7.2 g/dL (ref 6.5–8.1)

## 2024-01-25 LAB — URINALYSIS, ROUTINE W REFLEX MICROSCOPIC
Bilirubin Urine: NEGATIVE
Glucose, UA: NEGATIVE mg/dL
Hgb urine dipstick: NEGATIVE
Ketones, ur: 5 mg/dL — AB
Leukocytes,Ua: NEGATIVE
Nitrite: NEGATIVE
Protein, ur: NEGATIVE mg/dL
Specific Gravity, Urine: 1.006 (ref 1.005–1.030)
pH: 7 (ref 5.0–8.0)

## 2024-01-25 LAB — URINE DRUG SCREEN
Amphetamines: NEGATIVE
Barbiturates: NEGATIVE
Benzodiazepines: NEGATIVE
Cocaine: NEGATIVE
Fentanyl: NEGATIVE
Methadone Scn, Ur: NEGATIVE
Opiates: NEGATIVE
Tetrahydrocannabinol: NEGATIVE

## 2024-01-25 LAB — CBC WITH DIFFERENTIAL/PLATELET
Abs Immature Granulocytes: 0.02 K/uL (ref 0.00–0.07)
Basophils Absolute: 0.1 K/uL (ref 0.0–0.1)
Basophils Relative: 1 %
Eosinophils Absolute: 0.2 K/uL (ref 0.0–0.5)
Eosinophils Relative: 2 %
HCT: 39.6 % (ref 36.0–46.0)
Hemoglobin: 13.1 g/dL (ref 12.0–15.0)
Immature Granulocytes: 0 %
Lymphocytes Relative: 12 %
Lymphs Abs: 1 K/uL (ref 0.7–4.0)
MCH: 28.7 pg (ref 26.0–34.0)
MCHC: 33.1 g/dL (ref 30.0–36.0)
MCV: 86.8 fL (ref 80.0–100.0)
Monocytes Absolute: 0.9 K/uL (ref 0.1–1.0)
Monocytes Relative: 10 %
Neutro Abs: 6.3 K/uL (ref 1.7–7.7)
Neutrophils Relative %: 75 %
Platelets: 249 K/uL (ref 150–400)
RBC: 4.56 MIL/uL (ref 3.87–5.11)
RDW: 12.7 % (ref 11.5–15.5)
WBC: 8.4 K/uL (ref 4.0–10.5)
nRBC: 0 % (ref 0.0–0.2)

## 2024-01-25 LAB — TROPONIN T, HIGH SENSITIVITY
Troponin T High Sensitivity: <15 ng/L (ref 0–19)
Troponin T High Sensitivity: 16 ng/L (ref 0–19)

## 2024-01-25 LAB — ETHANOL: Alcohol, Ethyl (B): <15 mg/dL (ref <15)

## 2024-01-25 MED ORDER — QUETIAPINE FUMARATE 25 MG PO TABS
50.0000 mg | ORAL_TABLET | Freq: Once | ORAL | Status: AC
  Administered 2024-01-25: 50 mg via ORAL
  Filled 2024-01-25: qty 2

## 2024-01-25 MED ORDER — LABETALOL HCL 5 MG/ML IV SOLN
20.0000 mg | Freq: Once | INTRAVENOUS | Status: AC
  Administered 2024-01-25: 20 mg via INTRAVENOUS
  Filled 2024-01-25: qty 4

## 2024-01-25 MED ORDER — DONEPEZIL HCL 5 MG PO TABS
10.0000 mg | ORAL_TABLET | Freq: Every day | ORAL | Status: DC
  Administered 2024-01-25 – 2024-01-28 (×4): 10 mg via ORAL
  Filled 2024-01-25 (×4): qty 2

## 2024-01-25 MED ORDER — HYDRALAZINE HCL 25 MG PO TABS
25.0000 mg | ORAL_TABLET | Freq: Three times a day (TID) | ORAL | Status: DC
  Administered 2024-01-25 – 2024-01-29 (×11): 25 mg via ORAL
  Filled 2024-01-25 (×11): qty 1

## 2024-01-25 MED ORDER — HYDRALAZINE HCL 20 MG/ML IJ SOLN
10.0000 mg | Freq: Once | INTRAMUSCULAR | Status: AC
  Administered 2024-01-25: 10 mg via INTRAVENOUS
  Filled 2024-01-25: qty 1

## 2024-01-25 MED ORDER — BUSPIRONE HCL 5 MG PO TABS
10.0000 mg | ORAL_TABLET | Freq: Three times a day (TID) | ORAL | Status: DC
  Administered 2024-01-25 – 2024-01-29 (×11): 10 mg via ORAL
  Filled 2024-01-25 (×11): qty 2

## 2024-01-25 MED ORDER — LOSARTAN POTASSIUM 25 MG PO TABS
100.0000 mg | ORAL_TABLET | Freq: Every day | ORAL | Status: DC
  Administered 2024-01-26 – 2024-01-29 (×4): 100 mg via ORAL
  Filled 2024-01-25 (×4): qty 4
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
## 2024-01-26 ENCOUNTER — Telehealth: Payer: Self-pay

## 2024-01-26 ENCOUNTER — Telehealth: Payer: Self-pay | Admitting: Family Medicine

## 2024-01-26 MED ORDER — ACETAMINOPHEN 325 MG PO TABS
650.0000 mg | ORAL_TABLET | Freq: Four times a day (QID) | ORAL | Status: DC | PRN
  Administered 2024-01-26 – 2024-01-29 (×5): 650 mg via ORAL
  Filled 2024-01-26 (×5): qty 2

## 2024-01-26 MED ORDER — ACETAMINOPHEN 325 MG PO TABS: 650.0000 mg | ORAL_TABLET | Freq: Once | ORAL | Status: DC

## 2024-01-26 MED ORDER — SERTRALINE HCL 50 MG PO TABS
50.0000 mg | ORAL_TABLET | Freq: Every day | ORAL | Status: DC
  Administered 2024-01-26 – 2024-01-29 (×4): 50 mg via ORAL
  Filled 2024-01-26 (×4): qty 1

## 2024-01-26 MED ORDER — SERTRALINE HCL 50 MG PO TABS: 100.0000 mg | ORAL_TABLET | Freq: Every day | ORAL | Status: DC

## 2024-01-26 MED ORDER — PANTOPRAZOLE SODIUM 40 MG PO TBEC
40.0000 mg | DELAYED_RELEASE_TABLET | Freq: Two times a day (BID) | ORAL | Status: DC
  Administered 2024-01-26 – 2024-01-28 (×6): 40 mg via ORAL
  Filled 2024-01-26 (×6): qty 1

## 2024-01-26 NOTE — Progress Notes (Signed)
 LCSW Progress Note  [id number]   Kaitlyn Serrano  01/26/2024  10:22 AM  Description:   Inpatient Psychiatric Referral  Patient was recommended inpatient per Kathryne Show (NP). There are no available beds at [facility], per [facility] Carlo RN). Patient was referred to the following out of network facilities:   [facility] Provider Address Phone Fax  [facility]  [address], [place] [address] [phone]  [facility]  [address] [place] [address] [phone]  [facility]  [address] [place], [place] [address] [phone]  [facility]  [address] [place] [address] [phone]  [facility]  [address] [place] [address] [phone]  [facility]  [address] [place] [address] [phone]  [facility]  [address] [place] [address] [phone]  [facility] EFAX  [address] [place], [place] [phone]  [facility]  [address], [place] [address] [phone]  [facility]  [address] [place] [address] [phone]      Situation ongoing, CSW to continue following and update chart as more information becomes available.      Tunisia Adah Stoneberg, MSW, LCSW  01/26/2024 10:22 AM
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
## 2024-01-26 NOTE — ED Notes (Signed)
 Pt is currently sleeping comfortably. No current complaints.
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
## 2024-01-26 NOTE — Telephone Encounter (Signed)
 Copied from CRM #[id number]. Topic: General - Other >> Jan 26, 2024  3:32 PM Rachelle R wrote: Reason for CRM: Patients daughter trying to find a nursing home that will accept her due to the memory loss. States she tried a few so far and non will accept her. Is looking to see if Dr Jolinda might know of anyone that will accept her or anyone that would be able to assist in finding a nursing home for her.  Jon can be reached at [phone]

## 2024-01-26 NOTE — Telephone Encounter (Signed)
 Per other open phone note:  Attempted to reach angela.  No answer.  If she is admitted, the social worker at the hospital can get patient into a facility directly from hospital at discharge.  This should take the burden off Angela.  However, if not admitted, Donny has completed FL2 on her desk, we just need to know where to send it.     I see she requested [facility].  I'm assuming she is NOT admitted then correct?  I will cc Cathy who has her FL2.
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
## 2024-01-26 NOTE — ED Notes (Signed)
 Patient ambulated to the BR with Standby assistance.

## 2024-01-26 NOTE — ED Notes (Signed)
 Pt awake and sitting in the chair to help with her restless legs, ambulated to the BR with standby assistance. She is now reading a magazine.

## 2024-01-26 NOTE — Telephone Encounter (Signed)
 Attempted to reach angela.  No answer.  If she is admitted, the social worker at the hospital can get patient into a facility directly from hospital at discharge.  This should take the burden off Angela.  However, if not admitted, Donny has completed FL2 on her desk, we just need to know where to send it.

## 2024-01-26 NOTE — ED Notes (Signed)
 TTS in progress

## 2024-01-26 NOTE — Telephone Encounter (Signed)
 FL2 faxed to [facility] at [phone] per >> Jan 26, 2024  3:13 PM Larissa S wrote: Patient's daughter is requesting to have paperwork sent instead to [facility]. Address: [address] [place], [place] [address] Phone: [phone]
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
## 2024-01-26 NOTE — Progress Notes (Signed)
 Inpatient Psychiatric Referral  Patient was recommended inpatient per Kathryne Show, NP . There are no available beds at [facility], per [facility] . Patient was referred to the following out of network facilities:  [facility] Provider Address Phone Fax  [name]  [address] [place] [address] [phone]  [facility]  [address] [place] [address] [phone]  [facility]  [address] [place], [place] [address] [phone]  [facility]  [address] [place] [address] [phone]  [facility]  [address] [place] [address] [phone]  [facility]  [address], [place] [address] [phone]  [facility]  [address], [place] [address] [phone]  [facility] EFAX  [address] [place], [place] [phone]  [facility]  [address] [place] [address] [phone]  [facility]  [address] [place] [address] [phone]    Situation ongoing, CSW to continue following and update chart as more information becomes available.   Harrie Sofia MSW, [place] 01/26/2024
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
## 2024-01-26 NOTE — Telephone Encounter (Signed)
 Per other encounter faxed FL2 to [facility] >> Jan 26, 2024  3:13 PM Larissa S wrote: Patient's daughter is requesting to have paperwork sent instead to [facility]. Address: [address] [place], [place] [address] Phone: [phone]

## 2024-01-27 MED ORDER — HALOPERIDOL 0.5 MG PO TABS
2.0000 mg | ORAL_TABLET | Freq: Once | ORAL | Status: AC
  Administered 2024-01-27: 2 mg via ORAL
  Filled 2024-01-27: qty 4

## 2024-01-27 NOTE — ED Notes (Signed)
 Pt increasingly agitated stating I signed myself in, I can sign myself, wanting to leave and go home.  Pt coming back and forth up to nursing station, redirected to go back to room.  Pt currently in room stating I called my lawyer, I want to leave.

## 2024-01-27 NOTE — ED Notes (Signed)
 Pt is very talkative. Also complaining of her stomach hurting, having to go to the bathroom frequent.

## 2024-01-27 NOTE — Progress Notes (Signed)
 LCSW Progress Note  [id number]   Ione T Gerhart  01/27/2024  11:19 AM  Description:   Inpatient Psychiatric Referral  Patient was recommended inpatient per: Ene Ajibola (NP). There are no available beds at [facility], per [facility] RN). Patient was referred to the following out of network facilities:    [facility] Provider Address Phone Fax  [facility]  [address], [place] [address] [phone]  [facility]  [address] [place] [address] [phone]  [facility]  [address] [place], [place] [address] [phone]  [facility]  [address] [place] [address] [phone]  [facility]  [address] [place] [address] [phone]  [facility]  [address], [place] [address] [phone]  [facility]  [address] [place] [address] [phone]  [facility] EFAX  [address] [place], [place] [phone]  [facility]  [address] [place] [address] [phone]  [facility]  [address] [place] [address] [phone]      Situation ongoing, CSW to continue following and update chart as more information becomes available.      [place] Naziya He[place] MSW, LCSW  01/27/2024 11:19 AM

## 2024-01-27 NOTE — ED Provider Notes (Signed)
 Emergency Medicine Observation Re-evaluation Note  Kaitlyn Serrano is a 83 y.o. female, seen on rounds today.  Pt initially presented to the ED for complaints of Medical Clearance Currently, the patient is resting  Physical Exam  BP 132/70 (BP Location: Right Arm)   Pulse 86   Temp 97.7 F (36.5 C) (Oral)   Resp 16   Ht 5' 2 (1.575 m)   Wt 58 kg   SpO2 97%   BMI 23.39 kg/m  Physical Exam General: Calm Cardiac: Well perfused Lungs: Even respirations Psych: Calm  ED Course / MDM  EKG:EKG Interpretation Date/Time:  Sunday January 25 2024 19:40:00 EST Ventricular Rate:  103 PR Interval:  179 QRS Duration:  91 QT Interval:  349 QTC Calculation: 457 R Axis:   -20  Text Interpretation: Sinus tachycardia Multiform ventricular premature complexes Left ventricular hypertrophy Confirmed by Cleotilde Rogue ([id number]) on 01/25/2024 9:08:40 PM  I have reviewed the labs performed to date as well as medications administered while in observation.  Recent changes in the last 24 hours include FL2 completed and awaiting inpatient geri psych bed placement. Daughter and case mgmt also working on SNF placement.  Plan  Current plan is for inpatient geri psych.    [name], MD 02/05/24 1122

## 2024-01-27 NOTE — Progress Notes (Signed)
 Inpatient Psychiatric Referral  Patient was recommended inpatient per Kathryne Show, NP. There are no available beds at [facility], per [facility]. Patient was referred to the following out of network facilities:  [facility] Provider Address Phone Fax  [facility]  [address] [place] [address] [phone]  [facility]  [address] [place] [address] [phone]  [facility]  [address] [place], [place] [address] [phone]  [facility]  [address] [place] [address] [phone]  [facility]  [address] [place] [address] [phone]  [facility]  [address], [place] [address] [phone]  [facility]  [address] [place] [address] [phone]  [facility]  [address] [place], [place] [phone]  [facility]  [address], [place] [address] [phone]  [facility]  [address] [place] [address] [phone]    Situation ongoing, CSW to continue following and update chart as more information becomes available.  Harrie Sofia MSW, [place] 01/27/2024

## 2024-01-28 DIAGNOSIS — F22 Delusional disorders: Secondary | ICD-10-CM | POA: Diagnosis not present

## 2024-01-28 MED ORDER — OLANZAPINE 5 MG PO TABS
2.5000 mg | ORAL_TABLET | Freq: Every day | ORAL | Status: DC
  Administered 2024-01-28 – 2024-01-29 (×2): 2.5 mg via ORAL
  Filled 2024-01-28 (×2): qty 1
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
## 2024-01-28 NOTE — ED Provider Notes (Signed)
 Emergency Medicine Observation Re-evaluation Note  Kaitlyn Serrano is a 83 y.o. female, seen on rounds today.  Pt initially presented to the ED for complaints of Medical Clearance Currently, the patient is no new complaints.  Physical Exam  BP (!) 135/54 (BP Location: Right Arm)   Pulse (!) 106   Temp 98.7 F (37.1 C) (Oral)   Resp 18   Ht 5' 2 (1.575 m)   Wt 58 kg   SpO2 99%   BMI 23.39 kg/m  Physical Exam General: Resting comfortably in stretcher Lungs: Normal work of breathing Psych: Calm  ED Course / MDM  EKG:EKG Interpretation Date/Time:  Sunday January 25 2024 19:40:00 EST Ventricular Rate:  103 PR Interval:  179 QRS Duration:  91 QT Interval:  349 QTC Calculation: 457 R Axis:   -20  Text Interpretation: Sinus tachycardia Multiform ventricular premature complexes Left ventricular hypertrophy Confirmed by Cleotilde Rogue ([id number]) on 01/25/2024 9:08:40 PM  I have reviewed the labs performed to date as well as medications administered while in observation.  Recent changes in the last 24 hours include psychiatry is recommended inpatient treatment.  Plan  Current plan is for placement.    Yolande Lamar BROCKS, MD 01/28/24 [phone]

## 2024-01-28 NOTE — Progress Notes (Signed)
 LCSW Progress Note  [id number]   Kaitlyn Serrano  01/28/2024  2:05 PM  Description:   Inpatient Psychiatric Referral  Patient was recommended inpatient per Wyline Pizza  (NP). There are no available beds at [facility], per [facility] Noberto Qua RN). Patient was referred to the following out of network facilities:   [facility] Provider Address Phone Fax  [facility]  [address], [place] [address] [phone]  [facility]  [address] [place] [address] [phone]  [facility]  [address] [place], [place] [address] [phone]  [facility]  [address] [place] [address] [phone]  [facility]  [address] [place] [address] [phone]  [facility]  [address], [place] [address] [phone]  [facility]  [address] [place] [address] [phone]  [facility] EFAX  [address] [place], [place] [phone]  [facility]  [address] [place] [address] [phone]  [facility]  [address] [place] [address] [phone]      Situation ongoing, CSW to continue following and update chart as more information becomes available.      Tunisia Quindon Denker MSW, LCSW  01/28/2024 2:05 PM

## 2024-01-28 NOTE — ED Notes (Signed)
 CSW received consult to discuss memory care options with pt's daughter. CSW attempted to reach daughter, Jon, via t/c; voicemail was left.    Hoy Bigness MSW, LCSW Environmental Manager

## 2024-01-28 NOTE — ED Notes (Addendum)
 [facility] called pts daughter twice to inform her of updates from the provider regarding pts disposition and discuss possible discharge plans. [facility] left a HIPAA compliant message to return the call. [facility] called a third time and the call was answered but the person hung up.   Chesley Holt, [facility]  01/28/24

## 2024-01-28 NOTE — Progress Notes (Signed)
 Inpatient Psychiatric Referral  Patient was recommended inpatient per Wyline Pizza, NP . There are no available beds at [facility], per [facility]. Patient was referred to the following out of network facilities:  [facility] Provider Address Phone Fax  [facility]  [address], [place] [address] [phone]  [facility]  [address] [place] [address] [phone]  [facility]  [address] [place], [place] [address] [phone]  [facility]  [address] [place] [address] [phone]  [facility]  [address] [place] [address] [phone]  [facility]  [address], [place] [address] [phone]  [facility]  [address], [place] [address] [phone]  [facility]  [address] [place], [place] [phone]  [facility]  [address], [place] [address] [phone]  [facility]  [address] [place] [address] [phone]  [facility]  [address] [place], [place] [address] [phone]  [facility]  [address] [place] [address] [phone]  [facility]  [address] [place] [address] [phone]  [facility]  [address], [place] [address] [phone]  [facility] Patient Placement  [facility], [place] [phone]    Situation ongoing, CSW to continue following and update chart as more information becomes available.   Harrie Sofia  MSW, [place] 01/28/2024

## 2024-01-29 MED ORDER — OLANZAPINE 2.5 MG PO TABS: 2.5000 mg | ORAL_TABLET | Freq: Every day | ORAL | 0 refills | Status: DC

## 2024-01-29 MED ORDER — ALUM & MAG HYDROXIDE-SIMETH 200-200-20 MG/5ML PO SUSP
30.0000 mL | Freq: Once | ORAL | Status: AC
  Administered 2024-01-29: 30 mL via ORAL
  Filled 2024-01-29: qty 30

## 2024-01-29 NOTE — Discharge Instructions (Signed)
 Follow-up with neurology and your primary care provider.  Return here for any new or worsening symptoms.

## 2024-01-29 NOTE — ED Notes (Signed)
 CSW left 2nd voicemail for pt's daughter to provide resources for memory care.    Hoy Bigness MSW, LCSW Environmental Manager

## 2024-01-29 NOTE — ED Provider Notes (Addendum)
 Emergency Medicine Observation Re-evaluation Note  Kaitlyn Serrano is a 83 y.o. female, seen on rounds today.  Pt initially presented to the ED for complaints of Medical Clearance Currently, the patient is resting.  Notes she feels nervous and has some upper abdominal discomfort that is similar to prior stomach issues that she would normally take some antiacids for.  Physical Exam  BP (!) 186/91 (BP Location: Right Arm)   Pulse 85   Temp 98.2 F (36.8 C) (Oral)   Resp 18   Ht 5' 2 (1.575 m)   Wt 58 kg   SpO2 96%   BMI 23.39 kg/m  Physical Exam General: No distress Lungs: Normal effort Psych: No agitation  ED Course / MDM  EKG:EKG Interpretation Date/Time:  Wednesday January 28 2024 08:30:03 EST Ventricular Rate:  90 PR Interval:  190 QRS Duration:  82 QT Interval:  368 QTC Calculation: 450 R Axis:   -38  Text Interpretation: Normal sinus rhythm Left axis deviation Minimal voltage criteria for LVH, may be normal variant ( R in aVL ) Abnormal ECG No previous ECGs available Confirmed by Yolande Charleston [id number]) on 01/28/2024 11:32:30 AM  I have reviewed the labs performed to date as well as medications administered while in observation.  Recent changes in the last 24 hours include Zyprexa ordered by psychiatry.  Plan  Current plan is for inpatient psychiatric admission.    Freddi Hamilton, MD 01/29/24 [phone]  Psychiatry has cleared patient for discharge.  Will need outpatient neurology follow-up and possibly placement as an outpatient.  Daughter is comfortable picking her up.   Freddi Hamilton, MD 01/29/24 [phone]

## 2024-01-29 NOTE — ED Notes (Signed)
 Per social work pt is psych cleared and can d/c home. Pts daughter contacted to come pick up pt.
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
## 2024-01-29 NOTE — Progress Notes (Signed)
 Inpatient Psychiatric Referral   Patient was recommended inpatient per Wyline Pizza, NP . There are no available beds at [facility], per [facility]. Patient was referred to the following out of network facilities:  [facility] Provider Address Phone Fax  [facility]  [address], [place] [address] [phone]  [facility]  [address] [place] [address] [phone]  [facility]  [address] [place], [place] [address] [phone]  [facility]  [address] [place] [address] [phone]  [facility]  [address] [place] [address] [phone]  [facility]  [address], [place] [address] [phone]  [facility]  [address] [place] [address] [phone]  [facility] EFAX  [address] [place], [place] [phone]  [facility]  [address] [place] [address] [phone]  [facility]  [address] [place] [address] [phone]  [facility]  [address] [place], [place] [address] [phone]  [facility]  [address] [place] [address] [phone]  [facility]  [address] [place] [address] [phone]  [facility]  [address], [place] [address] [phone]  [facility]  [facility] [place], [place] [phone]   Kaitlyn Jaidah Lomax LCSW-A   01/29/2024 9:40 AM

## 2024-02-19 ENCOUNTER — Other Ambulatory Visit: Payer: Self-pay | Admitting: Family Medicine

## 2024-02-19 DIAGNOSIS — I10 Essential (primary) hypertension: Secondary | ICD-10-CM

## 2024-03-05 ENCOUNTER — Encounter: Payer: Self-pay | Admitting: Family Medicine

## 2024-03-05 ENCOUNTER — Ambulatory Visit: Admitting: Family Medicine

## 2024-03-05 VITALS — BP 148/82 | HR 69 | Temp 97.4°F | Ht 62.0 in | Wt 134.1 lb

## 2024-03-05 DIAGNOSIS — F411 Generalized anxiety disorder: Secondary | ICD-10-CM | POA: Diagnosis not present

## 2024-03-05 DIAGNOSIS — F331 Major depressive disorder, recurrent, moderate: Secondary | ICD-10-CM

## 2024-03-05 DIAGNOSIS — F03B11 Unspecified dementia, moderate, with agitation: Secondary | ICD-10-CM

## 2024-03-05 DIAGNOSIS — I10 Essential (primary) hypertension: Secondary | ICD-10-CM

## 2024-03-05 DIAGNOSIS — F5101 Primary insomnia: Secondary | ICD-10-CM | POA: Diagnosis not present

## 2024-03-05 DIAGNOSIS — Z23 Encounter for immunization: Secondary | ICD-10-CM

## 2024-03-05 MED ORDER — RISPERIDONE 0.5 MG PO TABS: 0.5000 mg | ORAL_TABLET | Freq: Every day | ORAL | 0 refills | Status: AC

## 2024-03-05 MED ORDER — HYDRALAZINE HCL 25 MG PO TABS: 25.0000 mg | ORAL_TABLET | Freq: Three times a day (TID) | ORAL | 1 refills | Status: AC

## 2024-03-05 NOTE — Progress Notes (Signed)
 "  Subjective: CC: Follow-up hypertension PCP: Kaitlyn Norene HERO, DO YEP:Kaitlyn Serrano is a 84 y.o. female presenting to clinic today for:  Patient is brought to the office by her daughter.  She notes that she has been out of all of her medications for about a week because the patient wanted to manage her own medicines and has lost them.  The daughter is asking that we try and get those renewed for her.  She was not aware that she can just go to the pharmacy and have refills done.  She does report that she was to be having more anxiety and depression because she has been out of Zoloft  and BuSpar  this entire time.  She was actually seen in December at the ER for some increasing agitation in the setting of her dementia.  Would like to get something for that going forward as she was only given a 30-day supply of Zyprexa  which again has been lost.   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 family unaware   Dysphagia    Dyspnea    Generalized weakness    GERD (gastroesophageal reflux disease)    GERD (gastroesophageal reflux disease)    High cholesterol    Hoarseness    Hx of cardiovascular stress test    negative 2012 and 2016   Hypertension    Insomnia    Intervertebral disc disorder with myelopathy, lumbar region    Lumbago    Lumbar spine pain    Non-ST elevation (NSTEMI) myocardial infarction (HCC) 04/03/2023   PONV (postoperative nausea and vomiting)    confusion after anesthesia   Renal insufficiency    Ringing in ears    resolved   Spondylosis with myelopathy, lumbar region    legs, back   Syncope and collapse 04/09/2023   Current Medications[2] Social History   Socioeconomic History   Marital status: Divorced     Spouse name: Not on file   Number of children: 2   Years of education: Not on file   Highest education level: Not on file  Occupational History   Not on file  Tobacco Use   Smoking status: Former    Current packs/day: 0.00    Average packs/day: 4.0 packs/day for 3.7 years (14.8 ttl pk-yrs)    Types: Cigarettes    Start date: 06/10/1960    Quit date: 02/20/1964    Years since quitting: 60.0   Smokeless tobacco: Never  Vaping Use   Vaping status: Never Used  Substance and Sexual Activity   Alcohol use: No    Alcohol/week: 0.0 standard drinks of alcohol   Drug use: No   Sexual activity: Not Currently    Birth control/protection: None  Other Topics Concern   Not on file  Social History Narrative   ** Merged History Encounter **       ** Merged History Encounter **  [name] lives alone. She is separated/divorced from her second husband and widowed by her first husband. She has twin daughters, Kaitlyn Serrano and Kaitlyn Serrano. Kaitlyn Serrano has been a cause of stress for her and she believes was preven   ting her from getting medical care that she needed. She is no longer talking  to Kaitlyn Serrano. She states that Kaitlyn Serrano is involved in her care and that she trusts her to have her best interest in mind.      Social Drivers of Health   Tobacco Use: Medium Risk (02/12/2024)   Received from [facility]   Patient History    Smoking Tobacco Use: Former    Smokeless Tobacco Use: Never    Passive Exposure: Past  Physicist, Medical Strain: Low Risk (11/27/2023)   Overall Financial Resource Strain (CARDIA)    Difficulty of Paying Living Expenses: Not hard at all  Food Insecurity: Food Insecurity Present (11/27/2023)   Epic    Worried About Programme Researcher, Broadcasting/film/video in the Last Year: Sometimes true    Ran Out of Food in the Last Year: Sometimes true  Transportation Needs: No Transportation Needs (11/27/2023)   Epic    Lack of Transportation (Medical): No    Lack of Transportation (Non-Medical): No  Physical  Activity: Insufficiently Active (11/27/2023)   Exercise Vital Sign    Days of Exercise per Week: 3 days    Minutes of Exercise per Session: 30 min  Stress: No Stress Concern Present (11/27/2023)   [name] - Occupational Stress Questionnaire    Feeling of Stress: Not at all  Social Connections: Socially Isolated (11/27/2023)   Social Connection and Isolation Panel    Frequency of Communication with Friends and Family: More than three times a week    Frequency of Social Gatherings with Friends and Family: More than three times a week    Attends Religious Services: Patient declined    Database Administrator or Organizations: No    Attends Banker Meetings: Never    Marital Status: Widowed  Intimate Partner Violence: Not At Risk (11/27/2023)   Epic    Fear of Current or Ex-Partner: No    Emotionally Abused: No    Physically Abused: No    Sexually Abused: No  Depression (PHQ2-9): Low Risk (11/27/2023)   Depression (PHQ2-9)    PHQ-2 Score: 0  Alcohol Screen: Low Risk (11/27/2023)   Alcohol Screen    Last Alcohol Screening Score (AUDIT): 0  Housing: Low Risk (11/27/2023)   Epic    Unable to Pay for Housing in the Last Year: No    Number of Times Moved in the Last Year: 0    Homeless in the Last Year: No  Utilities: Not At Risk (11/27/2023)   Epic    Threatened with loss of utilities: No  Recent Concern: Utilities - At Risk (11/27/2023)   Epic    Threatened with loss of utilities: Yes  Health Literacy: Adequate Health Literacy (11/27/2023)   B1300 Health Literacy    Frequency of need for help with medical instructions: Never   Family History  Problem Relation Age of Onset   Diabetes Mother    Heart disease Mother    Hypertension Mother    Gallbladder disease Mother    Heart disease Father    Pneumonia Sister    Breast cancer Paternal Grandmother    Hypertension Brother    Hyperlipidemia Brother    Hypertension Child    Gallbladder  disease Other     Objective: Office vital signs reviewed. BP (!) 148/82   Pulse 69   Temp (!) 97.4 F (36.3 C)   Ht 5' 2 (1.575 m)   Wt 134 lb 2 oz (60.8 kg)   SpO2 96%   BMI 24.53 kg/m   Physical Examination:  General:  Awake, alert, nontoxic elderly female, No acute distress HEENT: Sclera white.  Moist mucous membranes Cardio: regular rate and rhythm, S1S2 heard, no murmurs appreciated Pulm: clear to auscultation bilaterally, no wheezes, rhonchi or rales; normal work of breathing on room air MSK: Ambulates independently. Psych: Mood stable, speech normal, affect appropriate.  Does not appear to be responding to internal stimuli     11/27/2023    9:41 AM 11/25/2023   11:07 AM 09/16/2023   11:32 AM  Depression screen PHQ 2/9  Decreased Interest 0 1 0  Down, Depressed, Hopeless 0 1 1  PHQ - 2 Score 0 2 1  Altered sleeping 0 0 1  Tired, decreased energy 0 0 1  Change in appetite 0 0 0  Feeling bad or failure about yourself  0 1 0  Trouble concentrating 0 1 0  Moving slowly or fidgety/restless 0 0 0  Suicidal thoughts 0 0 0  PHQ-9 Score 0  4  3   Difficult doing work/chores Not difficult at all Somewhat difficult      Data saved with a previous flowsheet row definition      11/25/2023   11:08 AM 09/16/2023   11:32 AM 07/30/2023   10:11 AM 07/14/2023    2:56 PM  GAD 7 : Generalized Anxiety Score  Nervous, Anxious, on Edge 1 1 1 3   Control/stop worrying 1 1 1 3   Worry too much - different things 0 1 1 3   Trouble relaxing 1 0 0 3  Restless 0 0 0 3  Easily annoyed or irritable 0 0 0 3  Afraid - awful might happen 0 0 0 3  Total GAD 7 Score 3 3 3 21   Anxiety Difficulty Somewhat difficult Somewhat difficult Not difficult at all Extremely difficult     Assessment/ Plan: 84 y.o. female   Essential hypertension - Plan: hydrALAZINE  (APRESOLINE ) 25 MG tablet  Need for shingles vaccine  GAD (generalized anxiety disorder) - Plan: risperiDONE  (RISPERDAL ) 0.5 MG  tablet  Moderate dementia with agitation, unspecified dementia type (HCC) - Plan: risperiDONE  (RISPERDAL ) 0.5 MG tablet  Moderate episode of recurrent major depressive disorder (HCC) - Plan: risperiDONE  (RISPERDAL ) 0.5 MG tablet  Primary insomnia - Plan: risperiDONE  (RISPERDAL ) 0.5 MG tablet   Blood pressure borderline but technically in control for age.  I would like her to restart her medications because she needs this in the setting of history of hypertensive heart disease.  She needs her shingles vaccination but insurance may not cover it in an office setting so she will have this done at [facility]  Given her dementia with agitation, will start risperidone  0.5 mg daily.  Will plan to advance up to 2 mg if needed but will certainly need to watch for any excessive sedation or other complications.   Norene CHRISTELLA Fielding, DO [facility] [phone]     [1]  Allergies Allergen Reactions   Bee Venom Anaphylaxis   Penicillins Anaphylaxis and Rash   Elavil  [Amitriptyline  Hcl] Other (See Comments)    Confusion, hallucinations   Amlodipine  Swelling   Statins Swelling and Other (See Comments)    Peeling of skin  [2]  Current Outpatient Medications:    aspirin  EC 81 MG tablet, Take 81 mg by mouth daily. Swallow whole., Disp: , Rfl:    busPIRone  (BUSPAR ) 10 MG tablet, Take 1 tablet (10 mg total) by mouth 3 (three) times daily., Disp: 270 tablet, Rfl: 3   cyclobenzaprine  (FLEXERIL ) 10 MG tablet, Take 10  mg by mouth 3 (three) times daily as needed for muscle spasms., Disp: , Rfl:    donepezil  (ARICEPT ) 10 MG tablet, Take one tablet by mouth daily at 9pm bedtime for Dementia (Patient taking differently: Take 10 mg by mouth at bedtime. Take one tablet by mouth daily at 9pm bedtime for Dementia), Disp: 90 tablet, Rfl: 1   ezetimibe  (ZETIA ) 10 MG tablet, TAKE ONE TABLET BY MOUTH DAILY AT 9AM. NEEDS APPOINTMENT BEFORE FURTHER REFILLS, 2ND ATTEMPT, Disp: 90 tablet, Rfl: 3    hydrALAZINE  (APRESOLINE ) 25 MG tablet, TAKE 1 TABLET (25 MG TOTAL) 3 (THREE) TIMES DAILY. FOR BLOOD PRESSURE. CONTINUE LOSARTAN  DAILY., Disp: 270 tablet, Rfl: 1   HYDROcodone -acetaminophen  (NORCO) 10-325 MG tablet, Take 1 tablet by mouth every 6 (six) hours as needed for severe pain (pain score 7-10)., Disp: , Rfl:    losartan  (COZAAR ) 100 MG tablet, Take 1 tablet (100 mg total) by mouth daily., Disp: 90 tablet, Rfl: 3   metoprolol  succinate (TOPROL  XL) 25 MG 24 hr tablet, Take 1 tablet (25 mg total) by mouth daily., Disp: 90 tablet, Rfl: 3   OLANZapine  (ZYPREXA ) 2.5 MG tablet, Take 1 tablet (2.5 mg total) by mouth at bedtime., Disp: 30 tablet, Rfl: 0   polyethylene glycol (MIRALAX  / GLYCOLAX ) 17 g packet, Take 17 g by mouth daily as needed for moderate constipation., Disp: 30 each, Rfl: PRN   sertraline  (ZOLOFT ) 100 MG tablet, Take 1 tablet (100 mg total) by mouth daily. **dose change, Disp: 90 tablet, Rfl: 3   Suvorexant  (BELSOMRA ) 5 MG TABS, Take 1 tablet (5 mg total) by mouth at bedtime as needed (sleep to replace Ramelteon ). (Patient taking differently: Take 5 mg by mouth at bedtime.), Disp: 30 tablet, Rfl: 5  "

## 2024-05-24 ENCOUNTER — Ambulatory Visit: Payer: Self-pay | Admitting: Family Medicine

## 2024-11-30 ENCOUNTER — Ambulatory Visit: Payer: Self-pay
